# Patient Record
Sex: Female | Born: 1970 | Race: Black or African American | Hispanic: No | Smoking: Former smoker
Health system: Southern US, Community
[De-identification: ages and names within clinical notes are randomized; demographics above are authoritative.]

## PROBLEM LIST (undated history)

## (undated) DIAGNOSIS — R569 Unspecified convulsions: Secondary | ICD-10-CM

## (undated) DIAGNOSIS — I2510 Atherosclerotic heart disease of native coronary artery without angina pectoris: Secondary | ICD-10-CM

## (undated) DIAGNOSIS — K219 Gastro-esophageal reflux disease without esophagitis: Secondary | ICD-10-CM

## (undated) DIAGNOSIS — I219 Acute myocardial infarction, unspecified: Secondary | ICD-10-CM

## (undated) DIAGNOSIS — I10 Essential (primary) hypertension: Secondary | ICD-10-CM

## (undated) DIAGNOSIS — K861 Other chronic pancreatitis: Secondary | ICD-10-CM

## (undated) DIAGNOSIS — E1140 Type 2 diabetes mellitus with diabetic neuropathy, unspecified: Secondary | ICD-10-CM

## (undated) DIAGNOSIS — E1049 Type 1 diabetes mellitus with other diabetic neurological complication: Secondary | ICD-10-CM

## (undated) DIAGNOSIS — K5790 Diverticulosis of intestine, part unspecified, without perforation or abscess without bleeding: Secondary | ICD-10-CM

## (undated) DIAGNOSIS — E1143 Type 2 diabetes mellitus with diabetic autonomic (poly)neuropathy: Secondary | ICD-10-CM

## (undated) DIAGNOSIS — J449 Chronic obstructive pulmonary disease, unspecified: Secondary | ICD-10-CM

## (undated) DIAGNOSIS — E785 Hyperlipidemia, unspecified: Secondary | ICD-10-CM

## (undated) DIAGNOSIS — K3184 Gastroparesis: Secondary | ICD-10-CM

## (undated) HISTORY — PX: BACK SURGERY: SHX140

## (undated) HISTORY — PX: ABDOMINAL HYSTERECTOMY: SHX81

## (undated) HISTORY — PX: OTHER SURGICAL HISTORY: SHX169

## (undated) HISTORY — PX: CHOLECYSTECTOMY: SHX55

## (undated) HISTORY — PX: CORONARY STENT PLACEMENT: SHX1402

---

## 2006-01-18 ENCOUNTER — Encounter: Admission: RE | Admit: 2006-01-18 | Discharge: 2006-01-18 | Payer: Self-pay | Admitting: Neurosurgery

## 2007-02-10 ENCOUNTER — Encounter
Admission: RE | Admit: 2007-02-10 | Discharge: 2007-02-10 | Payer: Self-pay | Admitting: Physical Medicine and Rehabilitation

## 2010-08-07 ENCOUNTER — Emergency Department (HOSPITAL_BASED_OUTPATIENT_CLINIC_OR_DEPARTMENT_OTHER)
Admission: EM | Admit: 2010-08-07 | Discharge: 2010-08-07 | Payer: Self-pay | Source: Home / Self Care | Admitting: Emergency Medicine

## 2010-08-16 LAB — URINALYSIS, ROUTINE W REFLEX MICROSCOPIC
Bilirubin Urine: NEGATIVE
Ketones, ur: 15 mg/dL — AB
Leukocytes, UA: NEGATIVE
Nitrite: NEGATIVE
Protein, ur: >300 mg/dL — AB
Specific Gravity, Urine: 1.021 (ref 1.005–1.030)
Urine Glucose, Fasting: >1000 mg/dL — AB
Urobilinogen, UA: 1 mg/dL (ref 0.0–1.0)
pH: 6.5 (ref 5.0–8.0)

## 2010-08-16 LAB — GLUCOSE, CAPILLARY: Glucose-Capillary: 353 mg/dL — ABNORMAL HIGH (ref 70–99)

## 2010-08-16 LAB — URINE MICROSCOPIC-ADD ON

## 2010-08-22 ENCOUNTER — Encounter: Payer: Self-pay | Admitting: Neurosurgery

## 2011-01-11 ENCOUNTER — Emergency Department (HOSPITAL_COMMUNITY): Payer: PRIVATE HEALTH INSURANCE

## 2011-01-11 ENCOUNTER — Inpatient Hospital Stay (HOSPITAL_COMMUNITY)
Admission: EM | Admit: 2011-01-11 | Discharge: 2011-01-15 | DRG: 074 | Disposition: A | Discharge status: Home / Self Care | Payer: PRIVATE HEALTH INSURANCE | Attending: Internal Medicine | Admitting: Internal Medicine

## 2011-01-11 DIAGNOSIS — I252 Old myocardial infarction: Secondary | ICD-10-CM

## 2011-01-11 DIAGNOSIS — I2510 Atherosclerotic heart disease of native coronary artery without angina pectoris: Secondary | ICD-10-CM | POA: Diagnosis present

## 2011-01-11 DIAGNOSIS — I10 Essential (primary) hypertension: Secondary | ICD-10-CM | POA: Diagnosis present

## 2011-01-11 DIAGNOSIS — R109 Unspecified abdominal pain: Secondary | ICD-10-CM | POA: Diagnosis present

## 2011-01-11 DIAGNOSIS — K5900 Constipation, unspecified: Secondary | ICD-10-CM | POA: Diagnosis present

## 2011-01-11 DIAGNOSIS — E876 Hypokalemia: Secondary | ICD-10-CM | POA: Diagnosis present

## 2011-01-11 DIAGNOSIS — E1169 Type 2 diabetes mellitus with other specified complication: Secondary | ICD-10-CM | POA: Diagnosis present

## 2011-01-11 DIAGNOSIS — K219 Gastro-esophageal reflux disease without esophagitis: Secondary | ICD-10-CM | POA: Diagnosis present

## 2011-01-11 DIAGNOSIS — F411 Generalized anxiety disorder: Secondary | ICD-10-CM | POA: Diagnosis present

## 2011-01-11 DIAGNOSIS — D696 Thrombocytopenia, unspecified: Secondary | ICD-10-CM | POA: Diagnosis present

## 2011-01-11 DIAGNOSIS — E785 Hyperlipidemia, unspecified: Secondary | ICD-10-CM | POA: Diagnosis present

## 2011-01-11 DIAGNOSIS — N390 Urinary tract infection, site not specified: Secondary | ICD-10-CM | POA: Diagnosis present

## 2011-01-11 DIAGNOSIS — I509 Heart failure, unspecified: Secondary | ICD-10-CM | POA: Diagnosis present

## 2011-01-11 DIAGNOSIS — J45909 Unspecified asthma, uncomplicated: Secondary | ICD-10-CM | POA: Diagnosis present

## 2011-01-11 DIAGNOSIS — K3184 Gastroparesis: Secondary | ICD-10-CM | POA: Diagnosis present

## 2011-01-11 DIAGNOSIS — E1149 Type 2 diabetes mellitus with other diabetic neurological complication: Principal | ICD-10-CM | POA: Diagnosis present

## 2011-01-11 DIAGNOSIS — K861 Other chronic pancreatitis: Secondary | ICD-10-CM | POA: Diagnosis present

## 2011-01-11 LAB — URINE MICROSCOPIC-ADD ON

## 2011-01-11 LAB — URINALYSIS, ROUTINE W REFLEX MICROSCOPIC
Bilirubin Urine: NEGATIVE
Glucose, UA: >1000 mg/dL — AB
Ketones, ur: 15 mg/dL — AB
Leukocytes, UA: NEGATIVE
Nitrite: NEGATIVE
Protein, ur: >300 mg/dL — AB
Specific Gravity, Urine: 1.038 — ABNORMAL HIGH (ref 1.005–1.030)
Urobilinogen, UA: 1 mg/dL (ref 0.0–1.0)
pH: 6 (ref 5.0–8.0)

## 2011-01-11 LAB — GLUCOSE, CAPILLARY
Glucose-Capillary: 321 mg/dL — ABNORMAL HIGH (ref 70–99)
Glucose-Capillary: 315 mg/dL — ABNORMAL HIGH (ref 70–99)
Glucose-Capillary: 70 mg/dL (ref 70–99)
Glucose-Capillary: 73 mg/dL (ref 70–99)

## 2011-01-11 LAB — COMPREHENSIVE METABOLIC PANEL
ALT: 11 U/L (ref 0–35)
AST: 15 U/L (ref 0–37)
Albumin: 3.5 g/dL (ref 3.5–5.2)
Alkaline Phosphatase: 143 U/L — ABNORMAL HIGH (ref 39–117)
BUN: 14 mg/dL (ref 6–23)
CO2: 29 mEq/L (ref 19–32)
Calcium: 10.3 mg/dL (ref 8.4–10.5)
Chloride: 90 mEq/L — ABNORMAL LOW (ref 96–112)
Creatinine, Ser: 0.53 mg/dL (ref 0.4–1.2)
GFR calc Af Amer: >60 mL/min (ref >60)
GFR calc non Af Amer: >60 mL/min (ref >60)
Glucose, Bld: 340 mg/dL — ABNORMAL HIGH (ref 70–99)
Potassium: 3.7 mEq/L (ref 3.5–5.1)
Sodium: 134 mEq/L — ABNORMAL LOW (ref 135–145)
Total Bilirubin: 0.4 mg/dL (ref 0.3–1.2)
Total Protein: 8.3 g/dL (ref 6.0–8.3)

## 2011-01-11 LAB — DIFFERENTIAL
Basophils Absolute: 0 10*3/uL (ref 0.0–0.1)
Basophils Relative: 0 % (ref 0–1)
Eosinophils Absolute: 0 10*3/uL (ref 0.0–0.7)
Eosinophils Relative: 0 % (ref 0–5)
Lymphocytes Relative: 39 % (ref 12–46)
Lymphs Abs: 3.9 10*3/uL (ref 0.7–4.0)
Monocytes Absolute: 0.6 10*3/uL (ref 0.1–1.0)
Monocytes Relative: 6 % (ref 3–12)
Neutro Abs: 5.4 10*3/uL (ref 1.7–7.7)
Neutrophils Relative %: 55 % (ref 43–77)

## 2011-01-11 LAB — RAPID URINE DRUG SCREEN, HOSP PERFORMED
Amphetamines: NOT DETECTED
Barbiturates: NOT DETECTED
Benzodiazepines: NOT DETECTED
Cocaine: NOT DETECTED
Opiates: NOT DETECTED
Tetrahydrocannabinol: POSITIVE — AB

## 2011-01-11 LAB — CBC
HCT: 49 % — ABNORMAL HIGH (ref 36.0–46.0)
Hemoglobin: 17 g/dL — ABNORMAL HIGH (ref 12.0–15.0)
MCH: 30.1 pg (ref 26.0–34.0)
MCHC: 34.7 g/dL (ref 30.0–36.0)
MCV: 86.7 fL (ref 78.0–100.0)
Platelets: 190 10*3/uL (ref 150–400)
RBC: 5.65 MIL/uL — ABNORMAL HIGH (ref 3.87–5.11)
RDW: 13 % (ref 11.5–15.5)
WBC: 9.9 10*3/uL (ref 4.0–10.5)

## 2011-01-11 LAB — LIPASE, BLOOD: Lipase: 20 U/L (ref 11–59)

## 2011-01-11 LAB — HEMOGLOBIN A1C
Hgb A1c MFr Bld: 12.3 % — ABNORMAL HIGH (ref <5.7)
Mean Plasma Glucose: 306 mg/dL — ABNORMAL HIGH (ref <117)

## 2011-01-12 ENCOUNTER — Inpatient Hospital Stay (HOSPITAL_COMMUNITY): Payer: PRIVATE HEALTH INSURANCE

## 2011-01-12 LAB — CBC
HCT: 49.7 % — ABNORMAL HIGH (ref 36.0–46.0)
Hemoglobin: 16.9 g/dL — ABNORMAL HIGH (ref 12.0–15.0)
MCH: 30.2 pg (ref 26.0–34.0)
MCHC: 34 g/dL (ref 30.0–36.0)
MCV: 88.9 fL (ref 78.0–100.0)
Platelets: 90 10*3/uL — ABNORMAL LOW (ref 150–400)
RBC: 5.59 MIL/uL — ABNORMAL HIGH (ref 3.87–5.11)
RDW: 13.4 % (ref 11.5–15.5)
WBC: 4.8 10*3/uL (ref 4.0–10.5)

## 2011-01-12 LAB — GLUCOSE, CAPILLARY
Glucose-Capillary: 128 mg/dL — ABNORMAL HIGH (ref 70–99)
Glucose-Capillary: 131 mg/dL — ABNORMAL HIGH (ref 70–99)
Glucose-Capillary: 134 mg/dL — ABNORMAL HIGH (ref 70–99)
Glucose-Capillary: 141 mg/dL — ABNORMAL HIGH (ref 70–99)
Glucose-Capillary: 176 mg/dL — ABNORMAL HIGH (ref 70–99)
Glucose-Capillary: 69 mg/dL — ABNORMAL LOW (ref 70–99)

## 2011-01-12 LAB — BASIC METABOLIC PANEL
BUN: 15 mg/dL (ref 6–23)
CO2: 25 mEq/L (ref 19–32)
Calcium: 8.2 mg/dL — ABNORMAL LOW (ref 8.4–10.5)
Chloride: 105 mEq/L (ref 96–112)
Creatinine, Ser: 0.79 mg/dL (ref 0.4–1.2)
GFR calc Af Amer: >60 mL/min (ref >60)
GFR calc non Af Amer: >60 mL/min (ref >60)
Glucose, Bld: 123 mg/dL — ABNORMAL HIGH (ref 70–99)
Potassium: 3.4 mEq/L — ABNORMAL LOW (ref 3.5–5.1)
Sodium: 139 mEq/L (ref 135–145)

## 2011-01-12 LAB — TECHNOLOGIST SMEAR REVIEW

## 2011-01-12 NOTE — H&P (Signed)
NAMERUSTIE, GILL NO.:  [id number]  MEDICAL RECORD NO.:  [id number]  LOCATION:  WLED                         FACILITY:  [facility]  PHYSICIAN:  Jacquelynn Cree, M.D.   DATE OF BIRTH:  1971/06/16  DATE OF ADMISSION:  01/11/2011 DATE OF DISCHARGE:                             HISTORY & PHYSICAL   PRIMARY CARE PHYSICIAN:  Dr. Tempie Hoist at [facility].  CHIEF COMPLAINT:  Abdominal pain, nausea, vomiting.  HISTORY OF PRESENT ILLNESS:  The patient is a 40 year old female with a past medical history of chronic pancreatitis and diabetic gastroparesis with multiple hospital admissions at other facilities for intractable nausea and vomiting.  The patient states that she has had to have a percutaneous enteral gastrostomy tube placed on four separate occasions for treatment and tube feedings related to her gastroparesis.  She reports that she was in her usual state of health up until 2 days ago when she experienced a gradual onset of abdominal pain accompanied by nausea and vomiting.  She states she has vomited 10 to 15 times over the past 24 hours and has had some mild hematemesis.  Her last bowel movement was approximately 2 days ago and denies any melena or hematochezia.  She denies any fever but reports chills after vomiting episodes and that she has been unable to keep anything down including her medications for the past 2 days.  Nothing has made her symptoms better and attempting to eat makes her pain worse.  Upon initial evaluation in the emergency department, the patient was found to have accelerated hypertension and evidence of dehydration and therefore has been referred to the hospitalist service for further evaluation and treatment.  PAST MEDICAL HISTORY: 1. Chronic pancreatitis. 2. Type 2 diabetes. 3. Diabetic gastroparesis. 4. Hypertension. 5. Dyslipidemia. 6. Peripheral neuropathy. 7. Asthma. 8. Gastroesophageal reflux  disease. 9. Remote history of seizures (not on medicines, none x3 years) 10.Status post myocardial infarction. 11.Congestive heart failure, ejection fraction unknown.  PAST SURGICAL HISTORY: 1. Hysterectomy. 2. Back surgery. 3. Cholecystectomy. 4. PEG tube placement x4.  FAMILY HISTORY:  The patient's mother died at 28 from an unspecified cancer, possibly lung.  She also had COPD and endometriosis.  The patient's father is alive in his 40s.  He has hypertension and hiatal hernia.  The patient has 3 sisters who suffer with hypertension and diabetes.  SOCIAL HISTORY:  The patient is divorced and currently living with her sister and her 5 children.  She smokes 2 black and mild per day and denies alcohol as well as drug abuse.  She is currently disabled.  ALLERGIES:  COMPAZINE, DARVOCET, NSAIDs, PENICILLIN, ZOFRAN, GENERIC PHENERGAN.  CURRENT MEDICATIONS: 1. Advair 250/50 one inhalation b.i.d. 2. Aspirin 81 mg daily. 3. Gabapentin 600 mg q.i.d. 4. Humalog 10 units q.a.c. 5. Insulin sensitive sliding scale q.a.c. 6. Lipitor 80 mg p.o. daily. 7. Lisinopril 20 mg p.o. daily. 8. Metoprolol 25 mg p.o. b.i.d. 9. Nexium 40 mg p.o. b.i.d. 10.Nitroglycerin 0.4 mg sublingually p.r.n. chest pain x3. 11.Plavix 75 mg p.o. daily. 12.Phenergan 25 mg p.o. q.6 h p.r.n. nausea, vomiting. 13.Reglan 10 mg p.o. q.a.c. and h.s.  REVIEW OF SYSTEMS:  CONSTITUTIONAL:  Diminished appetite.  No weight loss.  No fever.  Positive chills.  HEENT:  The patient wears contacts and is edentulous.  CARDIOVASCULAR:  No current chest pain or dysrhythmia.  RESPIRATORY:  No shortness of breath or cough.  GI:  As per the illness of the HPI above.  GU:  No dysuria or hematuria. MUSCULOSKELETAL:  Occasional back pain.  A comprehensive 14-point review of systems is otherwise unremarkable.  PHYSICAL EXAMINATION:  VITAL SIGNS:  Temperature 98.8, pulse 115, respirations 22, blood pressure 195/121. GENERAL:  Well-developed, well-nourished African-American female who is writhing about on the bed in an exaggerated fashion. HEENT:  Normocephalic, atraumatic.  PERRL.  EOMI.  The patient is edentulous.  There is white coating to the tongue.  Mucous membranes are moist. NECK:  Supple, no thyromegaly, no lymphadenopathy, no jugular venous distention. CHEST:  Lungs clear to auscultation bilaterally with good air movement. HEART:  Tachycardic rate, regular rhythm.  No murmurs, rubs or gallops. ABDOMEN:  Soft.  Tender in an exaggerated fashion.  When the bed is jarred, the patient does not guard but has an exaggerated response to even mild touch to the abdomen.  There are positive bowel sounds. EXTREMITIES:  No clubbing, edema, or cyanosis. SKIN:  Warm, dry.  No rashes. NEUROLOGIC:  The patient is alert and oriented x3.  Cranial nerves II through XII grossly intact.  Nonfocal.  DATA REVIEW:  Acute abdominal series shows no active lung disease. Moderate amount of feces throughout the colon.  No obstruction or free air.  LABORATORY VALUES:  Lipase is 20.  Sodium is 134, potassium 3.7, chloride 90, bicarb 29, BUN 14, creatinine 0.53, glucose 340, calcium 10.3, total bilirubin 0.4, alkaline phosphatase 143, AST 15, ALT 11, total protein 8.3, albumin 3.5.  White blood cell count is 9.9, hemoglobin 17, hematocrit 49.0, platelets 190.  ASSESSMENT AND PLAN: 1. Intractable nausea, vomiting, abdominal pain in a patient with     known chronic pancreatitis and diabetic gastroparesis:  We will     admit the patient for conservative therapy including IV fluid     hydration, antiemetics, and judicious use of pain medications.     Given that she is currently constipated, we will also give her a     Dulcolax suppository and if she does not move her bowels, follow     this with a Fleet's enema.  Given that her exam is fairly     exaggerated and out of proportion to objective data, we will     attempt to contact  her primary care physician to extract further     historical data and information regarding her prior hospital stays.     We will urine drug screen her as well. 2. Diabetes:  We will check a hemoglobin A1c and place her on sliding     scale insulin q.4 h while n.p.o.. 3. Gastroparesis:  We will initiate IV Reglan q.6 h while n.p.o. and     change this to q.a.c. and h.s. when she is able to eat. 4. Hypertension:  Resume the patient's antihypertensives and use     hydralazine p.r.n. if she is unable to keep medications down. 5. Dyslipidemia:  We will continue the patient's statin therapy as     tolerated. 6. Neuropathy:  Continue the patient's Neurontin as tolerated. 7. Asthma:  Continue the patient's Advair.  She is not evidencing any     bronchospasm at the present time.  We will order albuterol on an as-  needed basis. 8. Gastroesophageal reflux disease:  Continue the patient's proton     pump inhibitor therapy. 9. Prophylaxis:  We will initiate Lovenox for DVT prophylaxis.  Time spent on admission including face-to-face time equals approximately 1 hour.     Jacquelynn Cree, M.D.     CR/MEDQ  D:  01/11/2011  T:  01/11/2011  Job:  [id number]  cc:   Dr. Tempie Hoist at [facility]  Electronically Signed by Jacquelynn Cree M.D. on 01/12/2011 12:44:37 PM

## 2011-01-13 ENCOUNTER — Inpatient Hospital Stay (HOSPITAL_COMMUNITY): Payer: PRIVATE HEALTH INSURANCE

## 2011-01-13 LAB — URINE MICROSCOPIC-ADD ON

## 2011-01-13 LAB — BASIC METABOLIC PANEL
BUN: 8 mg/dL (ref 6–23)
Calcium: 8.4 mg/dL (ref 8.4–10.5)
Creatinine, Ser: 0.48 mg/dL (ref 0.4–1.2)
GFR calc Af Amer: >60 mL/min (ref >60)
GFR calc non Af Amer: >60 mL/min (ref >60)

## 2011-01-13 LAB — URINALYSIS, ROUTINE W REFLEX MICROSCOPIC
Bilirubin Urine: NEGATIVE
Glucose, UA: NEGATIVE mg/dL
Ketones, ur: NEGATIVE mg/dL
Specific Gravity, Urine: 1.013 (ref 1.005–1.030)
pH: 5 (ref 5.0–8.0)

## 2011-01-13 LAB — GLUCOSE, CAPILLARY: Glucose-Capillary: 120 mg/dL — ABNORMAL HIGH (ref 70–99)

## 2011-01-13 LAB — CBC
HCT: 39.2 % (ref 36.0–46.0)
MCH: 29.3 pg (ref 26.0–34.0)
MCHC: 32.4 g/dL (ref 30.0–36.0)
MCV: 90.3 fL (ref 78.0–100.0)
RDW: 13.3 % (ref 11.5–15.5)

## 2011-01-13 MED ORDER — IOHEXOL 300 MG/ML  SOLN
100.0000 mL | Freq: Once | INTRAMUSCULAR | Status: AC | PRN
  Administered 2011-01-13: 100 mL via INTRAVENOUS

## 2011-01-14 LAB — CARDIAC PANEL(CRET KIN+CKTOT+MB+TROPI)
CK, MB: 1.8 ng/mL (ref 0.3–4.0)
CK, MB: 1.6 ng/mL (ref 0.3–4.0)
Relative Index: INVALID (ref 0.0–2.5)
Total CK: 94 U/L (ref 7–177)
Total CK: 92 U/L (ref 7–177)
Troponin I: <0.3 ng/mL (ref <0.30)

## 2011-01-14 LAB — COMPREHENSIVE METABOLIC PANEL
ALT: 14 U/L (ref 0–35)
AST: 21 U/L (ref 0–37)
Alkaline Phosphatase: 101 U/L (ref 39–117)
CO2: 25 mEq/L (ref 19–32)
Calcium: 8.9 mg/dL (ref 8.4–10.5)
Chloride: 104 mEq/L (ref 96–112)
Glucose, Bld: 63 mg/dL — ABNORMAL LOW (ref 70–99)
Sodium: 138 mEq/L (ref 135–145)
Total Bilirubin: 0.2 mg/dL — ABNORMAL LOW (ref 0.3–1.2)

## 2011-01-14 LAB — DIFFERENTIAL
Basophils Relative: 0 % (ref 0–1)
Eosinophils Absolute: 0.1 10*3/uL (ref 0.0–0.7)
Lymphs Abs: 3.2 10*3/uL (ref 0.7–4.0)
Monocytes Relative: 9 % (ref 3–12)
Neutro Abs: 3.1 10*3/uL (ref 1.7–7.7)
Neutrophils Relative %: 44 % (ref 43–77)

## 2011-01-14 LAB — GLUCOSE, CAPILLARY
Glucose-Capillary: 57 mg/dL — ABNORMAL LOW (ref 70–99)
Glucose-Capillary: 190 mg/dL — ABNORMAL HIGH (ref 70–99)
Glucose-Capillary: 182 mg/dL — ABNORMAL HIGH (ref 70–99)

## 2011-01-14 LAB — CBC
Hemoglobin: 13.7 g/dL (ref 12.0–15.0)
MCH: 29.8 pg (ref 26.0–34.0)
Platelets: 139 10*3/uL — ABNORMAL LOW (ref 150–400)
RBC: 4.59 MIL/uL (ref 3.87–5.11)
WBC: 7 10*3/uL (ref 4.0–10.5)

## 2011-01-15 LAB — URINE CULTURE
Colony Count: >=100000
Special Requests: NEGATIVE

## 2011-01-15 LAB — GLUCOSE, CAPILLARY: Glucose-Capillary: 238 mg/dL — ABNORMAL HIGH (ref 70–99)

## 2011-01-16 ENCOUNTER — Emergency Department (HOSPITAL_COMMUNITY): Payer: PRIVATE HEALTH INSURANCE

## 2011-01-16 ENCOUNTER — Emergency Department (HOSPITAL_COMMUNITY)
Admission: EM | Admit: 2011-01-16 | Discharge: 2011-01-16 | Disposition: A | Discharge status: Home / Self Care | Payer: PRIVATE HEALTH INSURANCE | Attending: Emergency Medicine | Admitting: Emergency Medicine

## 2011-01-16 DIAGNOSIS — M25579 Pain in unspecified ankle and joints of unspecified foot: Secondary | ICD-10-CM | POA: Insufficient documentation

## 2011-01-16 DIAGNOSIS — I10 Essential (primary) hypertension: Secondary | ICD-10-CM | POA: Insufficient documentation

## 2011-01-16 DIAGNOSIS — S93409A Sprain of unspecified ligament of unspecified ankle, initial encounter: Secondary | ICD-10-CM | POA: Insufficient documentation

## 2011-01-16 DIAGNOSIS — E119 Type 2 diabetes mellitus without complications: Secondary | ICD-10-CM | POA: Insufficient documentation

## 2011-01-16 DIAGNOSIS — Z794 Long term (current) use of insulin: Secondary | ICD-10-CM | POA: Insufficient documentation

## 2011-01-16 DIAGNOSIS — X500XXA Overexertion from strenuous movement or load, initial encounter: Secondary | ICD-10-CM | POA: Insufficient documentation

## 2011-01-19 ENCOUNTER — Emergency Department (HOSPITAL_COMMUNITY)
Admission: EM | Admit: 2011-01-19 | Discharge: 2011-01-19 | Disposition: A | Discharge status: Home / Self Care | Payer: PRIVATE HEALTH INSURANCE | Attending: Emergency Medicine | Admitting: Emergency Medicine

## 2011-01-19 DIAGNOSIS — Z0389 Encounter for observation for other suspected diseases and conditions ruled out: Secondary | ICD-10-CM | POA: Insufficient documentation

## 2011-02-08 NOTE — Discharge Summary (Signed)
[name], TREGLIA NO.:  [id number]  MEDICAL RECORD NO.:  [id number]  LOCATION:  [id number]                         FACILITY:  [facility]  PHYSICIAN:  Hosie Poisson, MD       DATE OF BIRTH:  [birth year]  DATE OF ADMISSION:  01/11/2011 DATE OF DISCHARGE:  01/15/2011                              DISCHARGE SUMMARY   PRIMARY CARE PHYSICIAN:  Tempie Hoist, MD, at [facility].  DISCHARGE DIAGNOSES: 1. Urinary tract infection. 2. Chronic pancreatitis. 3. Type 2 diabetes. 4. Diabetic gastroparesis. 5. Hypokalemia. 6. Hyperglycemia. 7. Hypertension. 8. Dyslipidemia. 9. Peripheral neuropathy. 10.Asthma. 11.Constipation. 12.Gastroesophageal reflux disease. 13.Remote history of seizures, not on any medications. 14.Status post myocardial infarction. 15.History of congestive heart failure.  DISCHARGE MEDICATIONS: 1. Acetaminophen 650 mg q.6 h. as needed. 2. Dulcolax 10 mg daily as needed. 3. Ciprofloxacin 500 mg twice daily for 7 days. 4. Advair 1 puff twice daily. 5. Gabapentin 600 mg 4 times a day. 6. Lisinopril 20 mg daily. 7. Reglan 10 mg every 6 hours. 8. Metoprolol 25 mg twice daily. 9. Zofran 4 mg every 6 hours. 10.Protonix 40 mg twice daily. 11.Crestor 40 mg daily. 12.Hydrocodone/APAP 5/325 every 8 hours as needed. 13.Lorazepam 0.5 mg daily at bedtime as needed.  PERTINENT LABORATORIES ON ADMISSION:  The patient had CBC significant for a hemoglobin of 17 and hematocrit of 49.  Comprehensive metabolic panel significant for sodium of 134, glucose of 340, chloride of 90, and lipase was 20.  Urinalysis was negative for nitrites and leukocytes. Urine drug screen was positive for tetrahydrocannabinol.  Hemoglobin A1c was 12.3.  Smear shows atypical lymphocytes, RBC morphology unremarkable.  Urinalysis on June 14th shows large leukocytes. Erythropoietin level was 6.5.  Cardiac enzymes were negative.  Urine culture showed E coli  pansensitive.  RADIOLOGY:  The patient had an acute abdominal series, which showed no lung disease, moderate amount of feces throughout the colon.  The patient had an ultrasound of the abdomen, showed dilatation of the intra and extrahepatic biliary tree, question of biliary obstruction is raised since the pancreatic duct is also dilated.  A CT of abdomen and pelvis was done, which showed distended and very thick-walled urinary bladder favoring cystitis, neurogenic bladder is differential diagnosis.  The amount of the biliary dilatation noted could be physiological based on prior cholecystectomy, nonspecific mild periportal edema.  BRIEF HOSPITAL COURSE:  This is a 40 year old lady with past medical history of chronic pancreatitis, diabetic gastroparesis, multiple hospital admissions at other facilities for intractable nausea and vomiting.  The patient was admitted for symptomatic control for nausea, vomiting, and abdominal pain with a known history of chronic pancreatitis.  She was put on conservative therapy including hydration, antiemetics, and pain medications.  Over the course of her hospitalization, her symptoms gradually improved.  Constipation:  She was given Dulcolax suppository and a Fleet enema and constipation resolved.  Hypertension:  Uncontrolled.  Type 2 diabetes:  The patient had a hemoglobin A1c done, which was 12. She was started on insulin and the patient became hypoglycemic and she was put on NovoLog sliding scale q.4 h.  Dyslipidemia:  Continue the patient's Crestor as needed.  Asthma:  Controlled.  Continue with Advair and albuterol as needed.  Urinary tract infection with Escherichia coli:  The patient was started on ciprofloxacin.  She was given a prescription for Cipro to complete the course of 7 days.  PHYSICAL EXAMINATION:  VITAL SIGNS:  On the day of discharge, the patient's vitals included temperature of 98, pulse of 87, respirations 18, blood  pressure 129/83, and saturating 97% on room air. GENERAL:  She is alert, afebrile, oriented x3, comfortable in no acute distress. CARDIOVASCULAR:  S1 and S2 heard. RESPIRATORY:  Clear to auscultation bilaterally. ABDOMEN:  Soft, nontender, and nondistended. EXTREMITIES:  No pedal edema.  On the day of discharge, the patient's symptoms of nausea, vomiting, and abdominal pain have improved.  She was given a prescription for antibiotics for her urinary tract infection.  She was recommended to follow with her PCP in about 1 week and also encouraged and counseling given regarding substance abuse.  The patient is hemodynamically stable for discharge.          ______________________________ Hosie Poisson, MD     VA/MEDQ  D:  02/08/2011  T:  02/08/2011  Job:  [id number]  Electronically Signed by Hosie Poisson MD on 02/08/2011 04:19:40 AM

## 2011-02-15 ENCOUNTER — Emergency Department (HOSPITAL_COMMUNITY): Payer: PRIVATE HEALTH INSURANCE

## 2011-02-15 ENCOUNTER — Emergency Department (HOSPITAL_COMMUNITY)
Admission: EM | Admit: 2011-02-15 | Discharge: 2011-02-16 | Disposition: A | Discharge status: Home / Self Care | Payer: PRIVATE HEALTH INSURANCE | Attending: Emergency Medicine | Admitting: Emergency Medicine

## 2011-02-15 DIAGNOSIS — E119 Type 2 diabetes mellitus without complications: Secondary | ICD-10-CM | POA: Insufficient documentation

## 2011-02-15 DIAGNOSIS — R112 Nausea with vomiting, unspecified: Secondary | ICD-10-CM | POA: Insufficient documentation

## 2011-02-15 DIAGNOSIS — G8929 Other chronic pain: Secondary | ICD-10-CM | POA: Insufficient documentation

## 2011-02-15 DIAGNOSIS — R109 Unspecified abdominal pain: Secondary | ICD-10-CM | POA: Insufficient documentation

## 2011-02-15 DIAGNOSIS — Z794 Long term (current) use of insulin: Secondary | ICD-10-CM | POA: Insufficient documentation

## 2011-02-16 LAB — COMPREHENSIVE METABOLIC PANEL
Albumin: 2.8 g/dL — ABNORMAL LOW (ref 3.5–5.2)
Alkaline Phosphatase: 123 U/L — ABNORMAL HIGH (ref 39–117)
BUN: 25 mg/dL — ABNORMAL HIGH (ref 6–23)
Calcium: 9 mg/dL (ref 8.4–10.5)
Creatinine, Ser: 0.73 mg/dL (ref 0.50–1.10)
GFR calc Af Amer: >60 mL/min (ref >60)
Glucose, Bld: 411 mg/dL — ABNORMAL HIGH (ref 70–99)
Total Protein: 7.6 g/dL (ref 6.0–8.3)

## 2011-02-16 LAB — URINALYSIS, ROUTINE W REFLEX MICROSCOPIC
Glucose, UA: >1000 mg/dL — AB
Leukocytes, UA: NEGATIVE
Protein, ur: >300 mg/dL — AB
Urobilinogen, UA: 0.2 mg/dL (ref 0.0–1.0)

## 2011-02-16 LAB — DIFFERENTIAL
Basophils Absolute: 0 10*3/uL (ref 0.0–0.1)
Eosinophils Relative: 0 % (ref 0–5)
Lymphocytes Relative: 32 % (ref 12–46)
Monocytes Absolute: 0.5 10*3/uL (ref 0.1–1.0)
Monocytes Relative: 6 % (ref 3–12)
Neutro Abs: 4.6 10*3/uL (ref 1.7–7.7)

## 2011-02-16 LAB — PREGNANCY, URINE: Preg Test, Ur: NEGATIVE

## 2011-02-16 LAB — LIPASE, BLOOD: Lipase: 27 U/L (ref 11–59)

## 2011-02-16 LAB — CBC
HCT: 42.1 % (ref 36.0–46.0)
Hemoglobin: 14.6 g/dL (ref 12.0–15.0)
MCH: 29.6 pg (ref 26.0–34.0)
MCHC: 34.7 g/dL (ref 30.0–36.0)
MCV: 85.4 fL (ref 78.0–100.0)
RDW: 12.7 % (ref 11.5–15.5)

## 2011-02-16 LAB — URINE MICROSCOPIC-ADD ON

## 2011-02-16 LAB — AMYLASE: Amylase: 67 U/L (ref 0–105)

## 2011-03-19 ENCOUNTER — Emergency Department (HOSPITAL_COMMUNITY): Payer: PRIVATE HEALTH INSURANCE

## 2011-03-19 ENCOUNTER — Emergency Department (HOSPITAL_COMMUNITY)
Admission: EM | Admit: 2011-03-19 | Discharge: 2011-03-19 | Disposition: A | Discharge status: Home / Self Care | Payer: PRIVATE HEALTH INSURANCE | Attending: Emergency Medicine | Admitting: Emergency Medicine

## 2011-03-19 DIAGNOSIS — S335XXA Sprain of ligaments of lumbar spine, initial encounter: Secondary | ICD-10-CM | POA: Insufficient documentation

## 2011-03-19 DIAGNOSIS — I10 Essential (primary) hypertension: Secondary | ICD-10-CM | POA: Insufficient documentation

## 2011-03-19 DIAGNOSIS — Z794 Long term (current) use of insulin: Secondary | ICD-10-CM | POA: Insufficient documentation

## 2011-03-19 DIAGNOSIS — E119 Type 2 diabetes mellitus without complications: Secondary | ICD-10-CM | POA: Insufficient documentation

## 2011-03-19 DIAGNOSIS — M25559 Pain in unspecified hip: Secondary | ICD-10-CM | POA: Insufficient documentation

## 2011-03-19 DIAGNOSIS — Y92009 Unspecified place in unspecified non-institutional (private) residence as the place of occurrence of the external cause: Secondary | ICD-10-CM | POA: Insufficient documentation

## 2011-03-19 DIAGNOSIS — S0990XA Unspecified injury of head, initial encounter: Secondary | ICD-10-CM | POA: Insufficient documentation

## 2011-03-19 DIAGNOSIS — W108XXA Fall (on) (from) other stairs and steps, initial encounter: Secondary | ICD-10-CM | POA: Insufficient documentation

## 2011-03-19 LAB — RAPID URINE DRUG SCREEN, HOSP PERFORMED
Amphetamines: NOT DETECTED
Tetrahydrocannabinol: POSITIVE — AB

## 2011-03-19 LAB — URINE MICROSCOPIC-ADD ON

## 2011-03-19 LAB — URINALYSIS, ROUTINE W REFLEX MICROSCOPIC
Glucose, UA: 250 mg/dL — AB
Protein, ur: >300 mg/dL — AB
Specific Gravity, Urine: 1.013 (ref 1.005–1.030)
Urobilinogen, UA: 0.2 mg/dL (ref 0.0–1.0)

## 2011-03-19 LAB — ETHANOL: Alcohol, Ethyl (B): <11 mg/dL (ref 0–11)

## 2011-03-28 ENCOUNTER — Other Ambulatory Visit: Payer: Self-pay | Admitting: Neurosurgery

## 2011-03-28 DIAGNOSIS — S0990XA Unspecified injury of head, initial encounter: Secondary | ICD-10-CM

## 2011-04-06 ENCOUNTER — Ambulatory Visit
Admission: RE | Admit: 2011-04-06 | Discharge: 2011-04-06 | Disposition: A | Discharge status: Home / Self Care | Payer: PRIVATE HEALTH INSURANCE | Source: Ambulatory Visit | Attending: Neurosurgery | Admitting: Neurosurgery

## 2011-04-06 DIAGNOSIS — S0990XA Unspecified injury of head, initial encounter: Secondary | ICD-10-CM

## 2011-06-10 ENCOUNTER — Inpatient Hospital Stay (HOSPITAL_COMMUNITY)
Admission: EM | Admit: 2011-06-10 | Discharge: 2011-06-12 | DRG: 074 | Disposition: A | Discharge status: Home / Self Care | Payer: PRIVATE HEALTH INSURANCE | Attending: Internal Medicine | Admitting: Internal Medicine

## 2011-06-10 ENCOUNTER — Emergency Department (HOSPITAL_COMMUNITY): Payer: PRIVATE HEALTH INSURANCE

## 2011-06-10 ENCOUNTER — Other Ambulatory Visit: Payer: Self-pay

## 2011-06-10 DIAGNOSIS — E785 Hyperlipidemia, unspecified: Secondary | ICD-10-CM | POA: Diagnosis present

## 2011-06-10 DIAGNOSIS — K219 Gastro-esophageal reflux disease without esophagitis: Secondary | ICD-10-CM | POA: Diagnosis present

## 2011-06-10 DIAGNOSIS — I2510 Atherosclerotic heart disease of native coronary artery without angina pectoris: Secondary | ICD-10-CM | POA: Diagnosis present

## 2011-06-10 DIAGNOSIS — K3184 Gastroparesis: Secondary | ICD-10-CM | POA: Diagnosis present

## 2011-06-10 DIAGNOSIS — E1149 Type 2 diabetes mellitus with other diabetic neurological complication: Principal | ICD-10-CM | POA: Diagnosis present

## 2011-06-10 DIAGNOSIS — E0865 Diabetes mellitus due to underlying condition with hyperglycemia: Secondary | ICD-10-CM

## 2011-06-10 DIAGNOSIS — R112 Nausea with vomiting, unspecified: Secondary | ICD-10-CM | POA: Diagnosis present

## 2011-06-10 DIAGNOSIS — K861 Other chronic pancreatitis: Secondary | ICD-10-CM | POA: Diagnosis present

## 2011-06-10 DIAGNOSIS — J45909 Unspecified asthma, uncomplicated: Secondary | ICD-10-CM | POA: Diagnosis present

## 2011-06-10 DIAGNOSIS — R748 Abnormal levels of other serum enzymes: Secondary | ICD-10-CM

## 2011-06-10 DIAGNOSIS — F17200 Nicotine dependence, unspecified, uncomplicated: Secondary | ICD-10-CM | POA: Diagnosis present

## 2011-06-10 DIAGNOSIS — Z794 Long term (current) use of insulin: Secondary | ICD-10-CM

## 2011-06-10 DIAGNOSIS — I10 Essential (primary) hypertension: Secondary | ICD-10-CM | POA: Diagnosis present

## 2011-06-10 DIAGNOSIS — E876 Hypokalemia: Secondary | ICD-10-CM | POA: Diagnosis present

## 2011-06-10 HISTORY — DX: Other chronic pancreatitis: K86.1

## 2011-06-10 HISTORY — DX: Atherosclerotic heart disease of native coronary artery without angina pectoris: I25.10

## 2011-06-10 HISTORY — DX: Hyperlipidemia, unspecified: E78.5

## 2011-06-10 HISTORY — DX: Essential (primary) hypertension: I10

## 2011-06-10 HISTORY — DX: Gastro-esophageal reflux disease without esophagitis: K21.9

## 2011-06-10 LAB — POCT PREGNANCY, URINE: Preg Test, Ur: NEGATIVE

## 2011-06-10 LAB — CREATININE, SERUM
Creatinine, Ser: 0.49 mg/dL — ABNORMAL LOW (ref 0.50–1.10)
GFR calc non Af Amer: >90 mL/min (ref >90)

## 2011-06-10 LAB — CBC
HCT: 40.3 % (ref 36.0–46.0)
Hemoglobin: 15.8 g/dL — ABNORMAL HIGH (ref 12.0–15.0)
Hemoglobin: 14 g/dL (ref 12.0–15.0)
MCH: 30.6 pg (ref 26.0–34.0)
MCH: 30.5 pg (ref 26.0–34.0)
MCV: 87.2 fL (ref 78.0–100.0)
RBC: 5.17 MIL/uL — ABNORMAL HIGH (ref 3.87–5.11)
RBC: 4.59 MIL/uL (ref 3.87–5.11)

## 2011-06-10 LAB — DIFFERENTIAL
Basophils Relative: 0 % (ref 0–1)
Eosinophils Relative: 0 % (ref 0–5)
Lymphs Abs: 2.9 10*3/uL (ref 0.7–4.0)
Monocytes Absolute: 0.5 10*3/uL (ref 0.1–1.0)

## 2011-06-10 LAB — URINALYSIS, ROUTINE W REFLEX MICROSCOPIC
Glucose, UA: >1000 mg/dL — AB
Ketones, ur: 15 mg/dL — AB
Protein, ur: >300 mg/dL — AB

## 2011-06-10 LAB — COMPREHENSIVE METABOLIC PANEL
BUN: 13 mg/dL (ref 6–23)
CO2: 23 mEq/L (ref 19–32)
Calcium: 10.1 mg/dL (ref 8.4–10.5)
Creatinine, Ser: 0.42 mg/dL — ABNORMAL LOW (ref 0.50–1.10)
GFR calc Af Amer: >90 mL/min (ref >90)
GFR calc non Af Amer: >90 mL/min (ref >90)
Glucose, Bld: 337 mg/dL — ABNORMAL HIGH (ref 70–99)

## 2011-06-10 LAB — CARDIAC PANEL(CRET KIN+CKTOT+MB+TROPI)
CK, MB: 1.9 ng/mL (ref 0.3–4.0)
Total CK: 137 U/L (ref 7–177)
Total CK: 411 U/L — ABNORMAL HIGH (ref 7–177)
Troponin I: <0.3 ng/mL (ref <0.30)

## 2011-06-10 LAB — RAPID URINE DRUG SCREEN, HOSP PERFORMED
Opiates: NOT DETECTED
Tetrahydrocannabinol: POSITIVE — AB

## 2011-06-10 LAB — URINE MICROSCOPIC-ADD ON

## 2011-06-10 LAB — ETHANOL: Alcohol, Ethyl (B): <11 mg/dL (ref 0–11)

## 2011-06-10 MED ORDER — ASPIRIN EC 81 MG PO TBEC
81.0000 mg | DELAYED_RELEASE_TABLET | Freq: Every day | ORAL | Status: DC
  Administered 2011-06-10 – 2011-06-12 (×3): 81 mg via ORAL
  Filled 2011-06-10 (×4): qty 1

## 2011-06-10 MED ORDER — METOCLOPRAMIDE HCL 5 MG/ML IJ SOLN
10.0000 mg | Freq: Once | INTRAMUSCULAR | Status: AC
  Administered 2011-06-10: 10 mg via INTRAMUSCULAR
  Filled 2011-06-10: qty 2

## 2011-06-10 MED ORDER — INSULIN REGULAR HUMAN 100 UNIT/ML IJ SOLN: 8.0000 [IU] | Freq: Once | INTRAMUSCULAR | Status: DC

## 2011-06-10 MED ORDER — CLOPIDOGREL BISULFATE 75 MG PO TABS
75.0000 mg | ORAL_TABLET | Freq: Every day | ORAL | Status: DC
  Administered 2011-06-11 – 2011-06-12 (×2): 75 mg via ORAL
  Filled 2011-06-10 (×3): qty 1

## 2011-06-10 MED ORDER — HYDRALAZINE HCL 20 MG/ML IJ SOLN
10.0000 mg | INTRAMUSCULAR | Status: DC | PRN
  Filled 2011-06-10: qty 0.5

## 2011-06-10 MED ORDER — METOCLOPRAMIDE HCL 5 MG/ML IJ SOLN
10.0000 mg | Freq: Four times a day (QID) | INTRAMUSCULAR | Status: DC
  Administered 2011-06-11 – 2011-06-12 (×7): 10 mg via INTRAVENOUS
  Filled 2011-06-10 (×15): qty 2

## 2011-06-10 MED ORDER — HYDROMORPHONE HCL PF 1 MG/ML IJ SOLN
2.0000 mg | Freq: Once | INTRAMUSCULAR | Status: AC
  Administered 2011-06-10: 2 mg via INTRAVENOUS
  Filled 2011-06-10: qty 1

## 2011-06-10 MED ORDER — NEOMYCIN-POLYMYXIN-HC 3.5-10000-1 OT SOLN
3.0000 [drp] | Freq: Four times a day (QID) | OTIC | Status: DC
  Administered 2011-06-11: 3 [drp] via OTIC
  Filled 2011-06-10: qty 10

## 2011-06-10 MED ORDER — INSULIN ASPART 100 UNIT/ML ~~LOC~~ SOLN
0.0000 [IU] | SUBCUTANEOUS | Status: DC
  Administered 2011-06-10: 17:00:00 via SUBCUTANEOUS
  Administered 2011-06-11 – 2011-06-12 (×4): 2 [IU] via SUBCUTANEOUS
  Filled 2011-06-10: qty 3

## 2011-06-10 MED ORDER — PANTOPRAZOLE SODIUM 40 MG IV SOLR
40.0000 mg | Freq: Two times a day (BID) | INTRAVENOUS | Status: DC
  Administered 2011-06-10 – 2011-06-12 (×4): 40 mg via INTRAVENOUS
  Filled 2011-06-10 (×7): qty 40

## 2011-06-10 MED ORDER — INSULIN ASPART 100 UNIT/ML ~~LOC~~ SOLN
8.0000 [IU] | SUBCUTANEOUS | Status: AC
  Administered 2011-06-10: 8 [IU] via SUBCUTANEOUS

## 2011-06-10 MED ORDER — FLUTICASONE-SALMETEROL 250-50 MCG/DOSE IN AEPB
1.0000 | INHALATION_SPRAY | Freq: Two times a day (BID) | RESPIRATORY_TRACT | Status: DC
  Administered 2011-06-10 – 2011-06-12 (×4): 1 via RESPIRATORY_TRACT
  Filled 2011-06-10: qty 14

## 2011-06-10 MED ORDER — PROMETHAZINE HCL 25 MG PO TABS: 12.5000 mg | ORAL_TABLET | Freq: Four times a day (QID) | ORAL | Status: DC | PRN

## 2011-06-10 MED ORDER — PROMETHAZINE HCL 25 MG/ML IJ SOLN
12.5000 mg | Freq: Four times a day (QID) | INTRAMUSCULAR | Status: DC | PRN
  Administered 2011-06-10 – 2011-06-12 (×5): 12.5 mg via INTRAVENOUS
  Filled 2011-06-10 (×4): qty 1

## 2011-06-10 MED ORDER — ENOXAPARIN SODIUM 40 MG/0.4ML ~~LOC~~ SOLN
40.0000 mg | SUBCUTANEOUS | Status: DC
  Administered 2011-06-10 – 2011-06-11 (×2): 40 mg via SUBCUTANEOUS
  Filled 2011-06-10 (×4): qty 0.4

## 2011-06-10 MED ORDER — HYDROMORPHONE HCL PF 1 MG/ML IJ SOLN
1.0000 mg | INTRAMUSCULAR | Status: DC | PRN
  Administered 2011-06-10: 0.5 mg via INTRAMUSCULAR
  Administered 2011-06-11 (×5): 1 mg via INTRAMUSCULAR
  Administered 2011-06-11: 0.5 mg via INTRAMUSCULAR
  Administered 2011-06-12 (×2): 1 mg via INTRAMUSCULAR
  Filled 2011-06-10 (×8): qty 1

## 2011-06-10 MED ORDER — ACETAMINOPHEN 650 MG RE SUPP: 650.0000 mg | Freq: Four times a day (QID) | RECTAL | Status: DC | PRN

## 2011-06-10 MED ORDER — DOCUSATE SODIUM 100 MG PO CAPS
100.0000 mg | ORAL_CAPSULE | Freq: Two times a day (BID) | ORAL | Status: DC
  Administered 2011-06-10 – 2011-06-11 (×2): 100 mg via ORAL
  Filled 2011-06-10 (×7): qty 1

## 2011-06-10 MED ORDER — HYDROMORPHONE HCL PF 1 MG/ML IJ SOLN
0.5000 mg | INTRAMUSCULAR | Status: DC | PRN
  Filled 2011-06-10: qty 1

## 2011-06-10 MED ORDER — SODIUM CHLORIDE 0.9 % IV SOLN
INTRAVENOUS | Status: DC
  Administered 2011-06-10 – 2011-06-11 (×2): via INTRAVENOUS

## 2011-06-10 MED ORDER — ACETAMINOPHEN 325 MG PO TABS: 650.0000 mg | ORAL_TABLET | Freq: Four times a day (QID) | ORAL | Status: DC | PRN

## 2011-06-10 MED ORDER — INSULIN ASPART 100 UNIT/ML ~~LOC~~ SOLN
SUBCUTANEOUS | Status: AC
  Filled 2011-06-10: qty 3

## 2011-06-10 MED ORDER — PROMETHAZINE HCL 25 MG/ML IJ SOLN
25.0000 mg | Freq: Four times a day (QID) | INTRAMUSCULAR | Status: DC | PRN
  Filled 2011-06-10: qty 1

## 2011-06-10 MED ORDER — PROMETHAZINE HCL 25 MG/ML IJ SOLN
25.0000 mg | Freq: Once | INTRAMUSCULAR | Status: AC
  Administered 2011-06-10: 25 mg via INTRAMUSCULAR
  Filled 2011-06-10: qty 1

## 2011-06-10 MED ORDER — SODIUM CHLORIDE 0.9 % IJ SOLN
10.0000 mL | Freq: Two times a day (BID) | INTRAMUSCULAR | Status: DC
  Administered 2011-06-10 – 2011-06-11 (×3): 10 mL

## 2011-06-10 MED ORDER — BIOTENE DRY MOUTH MT LIQD
15.0000 mL | Freq: Two times a day (BID) | OROMUCOSAL | Status: DC
  Administered 2011-06-11 – 2011-06-12 (×3): 15 mL via OROMUCOSAL

## 2011-06-10 MED ORDER — HYDROMORPHONE HCL PF 1 MG/ML IJ SOLN
1.0000 mg | Freq: Once | INTRAMUSCULAR | Status: AC
  Administered 2011-06-10: 1 mg via INTRAMUSCULAR
  Filled 2011-06-10: qty 1

## 2011-06-10 MED ORDER — SODIUM CHLORIDE 0.9 % IJ SOLN: 10.0000 mL | INTRAMUSCULAR | Status: DC | PRN

## 2011-06-10 NOTE — H&P (Signed)
PCP:   No primary provider on file.   Chief Complaint:  Nausea and vomiting.  HPI: Patient is a 40 year old African American female with past medical history significant for diabetes gastroparesis with multiple admissions to various facilities secondary to nausea vomiting and and abdominal pain. She stated that she's had this problem for quite some time. She stated that she's had a PEG tube placed for time secondary to continued nausea vomiting. She stated that her symptoms started today. She has had no hematemesis she has had no fevers or. She does complain of chills. She has not been able to take any of her medications.  Review of Systems:  The patient denies anorexia, fever, weight loss,, vision loss, decreased hearing, hoarseness, chest pain, syncope, dyspnea on exertion, peripheral edema, balance deficits, hemoptysis, melena, hematochezia, severe indigestion/heartburn, hematuria, incontinence, genital sores, muscle weakness, suspicious skin lesions, transient blindness, difficulty walking, depression, unusual weight change, abnormal bleeding, enlarged lymph nodes, angioedema, and breast masses.  Past Medical History: Past Medical History  Diagnosis Date  . Hypertension   . Diabetes mellitus   . Gastroparesis   . Asthma   . GERD (gastroesophageal reflux disease)   . Coronary artery disease   . h/o seizure   . Neuropathy   . Chronic pancreatitis   . Dyslipidemia    Past Surgical History  Procedure Date  . Abdominal hysterectomy   . Back surgery   . Cholecystectomy   . Peg tube x 4     Medications: Patient states that there has been no change to her medications since her previous admission. Advair 250/50 twice a day Aspirin 81 mg daily Gabapentin 600 mg 4 times daily Humalog 10 units before every meal. Sliding-scale insulin Lipitor 80 mg each bedtime Lisinopril 20 mg daily Metoprolol 25 mg twice daily Nexium 40 mg twice daily Nitroglycerin sublingual when  necessary Plavix 75 mg daily Phenergan 25 mg every 6 hours when necessary Reglan 10 mg with each meal and at bedtime. Prior to Admission medications   Not on File    Allergies:   Allergies  Allergen Reactions  . Compazine Other (See Comments)    UNKNOWN  . Darvocet (Propoxyphene N-Acetaminophen) Other (See Comments)    UNKNOWN  . Nsaids Other (See Comments)    UNKNOWN  . Penicillins Other (See Comments)    UNKNOWN  . Zofran Other (See Comments)    UNKNOWN    Social History:  Reports that she has been smoking Cigarettes.  She has smoked for the past 2 years. She has never used smokeless tobacco. No: Use. She is divorced and has 5 children. She lives with her 5 children. She is presently on disability.  Family History: Mother died at 56 years old from cancer. Father is in his 84s he is hypertension.  Physical Exam: Filed Vitals:   06/10/11 1305  BP: 135/96  Pulse: 122  Temp: 98.7 F (37.1 C)  TempSrc: Oral  Resp: 20  SpO2: 100%   General: Patient appears her stated age. HEENT: Head is normocephalic atraumatic. Pupils reactive to light. Mild fluid of the left tympanic membrane. Cardiovascular: Tachycardic no murmurs or gallops. Lungs: Clear to auscultation bilaterally no wheezes rhonchi or rales. Abdomen: Mid abdominal scar soft diffusely tender absent bowel sounds. Extremities: No edema.   Labs on Admission:   [facility] 06/10/11 1404  NA 131*  K 4.8  CL 94*  CO2 23  GLUCOSE 337*  BUN 13  CREATININE 0.42*  CALCIUM 10.1  MG --  PHOS --  Basename 06/10/11 1404  AST 26  ALT 18  ALKPHOS 134*  BILITOT 0.3  PROT 8.1  ALBUMIN 3.3*    Basename 06/10/11 1404  LIPASE 21  AMYLASE --    Basename 06/10/11 1404  WBC 10.2  NEUTROABS 6.8  HGB 15.8*  HCT 45.1  MCV 87.2  PLT 200    Basename 06/10/11 1404  CKTOTAL 137  CKMB 1.9  CKMBINDEX --  TROPONINI 0.43*   No results found for this basename: TSH,T4TOTAL,FREET3,T3FREE,THYROIDAB in the last 72  hours No results found for this basename: VITAMINB12:2,FOLATE:2,FERRITIN:2,TIBC:2,IRON:2,RETICCTPCT:2 in the last 72 hours  Radiological Exams on Admission: Ct Abdomen Pelvis Wo Contrast  06/10/2011  *RADIOLOGY REPORT*  Clinical Data: Acute epigastric abdominal pain, nausea/vomiting  CT ABDOMEN AND PELVIS WITHOUT CONTRAST  Technique:  Multidetector CT imaging of the abdomen and pelvis was performed following the standard protocol without intravenous contrast.  Comparison: 01/13/2011  Findings: Lung bases are essentially clear.  Coronary atherosclerosis.  Unenhanced liver, spleen, pancreas, and adrenal glands within normal limits.  Status post cholecystectomy.  No intrahepatic or extrahepatic ductal dilatation.  Kidneys within normal limits.  No renal calculi or hydronephrosis.  No evidence of bowel obstruction. Filling of the left mid abdomen (series 2/image 42).  No evidence of abdominal aortic aneurysm.  No abdominopelvic ascites.  No suspicious abdominopelvic lymphadenopathy.  Status post hysterectomy.  No adnexal masses.  No ureteral or bladder calculi.  Bladder is thick-walled and notable for nondependent gas.  Prior ray cage fusion at L4-5.  Mild degenerative changes in the visualized thoracolumbar spine.  IMPRESSION: Bladder is thick-walled and notable for nondependent gas.  In the absence of recent intervention/catheterization, this appearance is suspicious for cystitis.  No renal, ureteral, or bladder calculi.  No hydronephrosis.  Original Report Authenticated By: Julian Hy, M.D.   Dg Abd Acute W/chest  06/10/2011  *RADIOLOGY REPORT*  Clinical Data: Nausea, vomiting, shortness of breath.  ACUTE ABDOMEN SERIES (ABDOMEN 2 VIEW & CHEST 1 VIEW)  Comparison: 02/16/2011  Findings: Prior cholecystectomy.  Nonspecific bowel gas pattern. Single mildly prominent small bowel loop in the lower pelvis. Surgical changes noted in the left abdomen.  No free air organomegaly.  No suspicious calcification.   Calcified phleboliths in the anatomic pelvis.  Heart and mediastinal contours are within normal limits.  No focal opacities or effusions.  No acute bony abnormality.  IMPRESSION: Nonspecific bowel gas pattern with a single prominent small bowel loop in the lower pelvis.  Original Report Authenticated By: Raelyn Number, M.D.    Assessment/Plan .Nausea and vomiting Bowel rest and supportive care.   Marland KitchenHTN (hypertension) When necessary hydralazine   .Chronic pancreatitis N.p.o. except for ice chips.   .Diabetes mellitus due to underlying condition with hyperglycemia Will put her on sliding scale insulin every 4 and check hemoglobin A1c   .GERD (gastroesophageal reflux disease) Continue PPI IV.  Marland Kitchen Gastroparesis Will treat her with IV Reglan and bowel rest.  Positive troponin Patient's troponin is elevated. However she does not have any chest pain or shortness of breath. Will order 2-D echo and will cycle her cardiac markers.  Her blood pressure is elevated over 200 so her positive troponins could be related to her blood pressure.  Will consult cardiology if they continue to trend upwards.  Mild  otitis media: Corticosporin otic  Rosana Hoes MD, Sharlet Salina 06/10/2011, 6:11 PM

## 2011-06-10 NOTE — ED Notes (Signed)
Pt has hx of gastroparesis.  Takes Reglan.  Sx started 10 am this morning. Pt refused for me to start an IV---stated that IV team always needs to start her IVs.  IV team beeped.

## 2011-06-10 NOTE — ED Notes (Signed)
MD at bedside. 

## 2011-06-10 NOTE — ED Notes (Signed)
IV team will be here after 7pm to put a PICC line at the bedside.

## 2011-06-10 NOTE — ED Provider Notes (Addendum)
History     CSN: [id number] Arrival date & time: 06/10/2011  1:01 PM   None     Chief Complaint  Patient presents with  . Abdominal Pain    (Consider location/radiation/quality/duration/timing/severity/associated sxs/prior treatment) HPI  This is a 40 year old lady with PMH of DM type II, diabetic gastroparesis, chronic pancreatitis, HTN, CHF and MI who presents to the ED with acute abdominal pain.  History was provided by patient. This history is limited due to acute distress. Patient states that she started to have acute abdomen pain at 10 AM this morning. Sharp pain, 10/10, mainly located in the epigastric area with radiation to her back, accompanied with nausea and vomiting with stomach content, no hematemesis.    Patient also reports dysuria, urinary frequency and urgency for a month. It is unclear why she didn't seek medical attention.   Of note, she had recent hospitalization in June for nausea vomiting and abdominal pain.   Past Medical History  Diagnosis Date  . Hypertension   . Diabetes mellitus   . Gastroparesis     Past Surgical History  Procedure Date  . Unknown     No family history on file.  History  Substance Use Topics  . Smoking status: Not on file  . Smokeless tobacco: Not on file  . Alcohol Use:      Pt unable to answer at this time--in too much pain.    OB History    Grav Para Term Preterm Abortions TAB SAB Ect Mult Living                  Review of Systems  All other systems reviewed and are negative.    Allergies  Compazine; Darvocet; Nsaids; Penicillins; Phenergan-codeine; and Zofran  Home Medications  No current outpatient prescriptions on file.  BP 135/96  Pulse 122  Temp(Src) 98.7 F (37.1 C) (Oral)  Resp 20  SpO2 100%  Physical Exam Physical examination is limited due to her noncooperation secondary to her abdominal pain  General: alert, well-developed, moderate distress. Head: normocephalic and atraumatic.  Eyes:  vision grossly intact, pupils equal, pupils round, pupils reactive to light, no injection and anicteric.  Mouth: pharynx pink and moist, no erythema, and no exudates.  Neck: supple, full ROM, no thyromegaly, no JVD, and no carotid bruits.  Lungs: normal respiratory effort, no accessory muscle use, normal breath sounds, no crackles, and no wheezes. Heart: normal rate, regular rhythm, no murmur, no gallop, and no rub.  Abdomen: soft, diffused tenderness noted,mainly located on Left epigastric area. Guarding noted. no rebound tenderness, BS x 4 active.  Left CVA tenderness.  Msk: no joint swelling, no joint warmth, and no redness over joints.  Pulses: 2+ DP/PT pulses bilaterally Extremities: No cyanosis, clubbing, edema Neurologic: alert & oriented X3,unable to perform neuro assessment.  ED Course  Procedures (including critical care time)  2:00 PM Pt c/o severe pain at left epigastric area with radiation to left back. Nausea and vomiting x1 now with stomach content.  Unsuccessful  IV insertion x 2 by IV team.  Will administer Dilaudid and Reglan IV for symptomatic treatment.  2:20PM Pt examined by Dr. Zenia Resides, Pt still c/o severe abd pain and nausea/vomiting. IV team called for PICC line insertion.       MDM   Attending note: pt with abdominal pain from chronic gastroparesis--abd exam with diffuse tenderness, will order pain meds, labs--expect admission     3:59 PM Pt rechecked and more comfortable, will give additional  dose of pain meds, labs reviewed, will likely be able to discharge  4:29 PM Pt with elevated troponin, ecg results noted as below, spoke with triad, will admit--no evidence of stemi at this time   Date: 06/10/2011  Rate: 100  Rhythm: normal sinus rhythm  QRS Axis: normal  Intervals: normal  ST/T Wave abnormalities: normal  Conduction Disutrbances:none  Narrative Interpretation:   Old EKG Reviewed: none available    Leota Jacobsen, MD 06/10/11  [place], MD 06/10/11 1646  Leota Jacobsen, MD 09/19/11 [phone]

## 2011-06-10 NOTE — ED Notes (Signed)
[id number] Expected date:06/10/11<BR> Expected time:12:22 PM<BR> Means of arrival:Ambulance<BR> Comments:<BR> M31 - 40yoF Abd Pain

## 2011-06-10 NOTE — ED Notes (Signed)
2unsuccessful attempts by IV team to gain IV access.

## 2011-06-10 NOTE — Discharge Planning (Signed)
ED CM noted admission RN note about pt having issues with medication and transportation. No pcp also listed.  Spoke with pt who confirms she is seen now at [facility] [place], [facility] [place].  Provided with contact information for medicaid transportation services and list of financial assistance resources plus local medicaid pcps for [employer] Pt noted to doze during visit She placed written information Cm provided in her purse. Pt listed with [employer] and medicaid coverage. Medicaid patients are offered low discounted medications ($2-3)

## 2011-06-10 NOTE — Progress Notes (Signed)
Consent for PICC obtained. Room assignment 1403. Patient report already called to floor. Ready for transfer to floor. Will insert PICC after transfer to room 1403 for patient comfort and per patient request. Spoke to ED RN about plan for insertion after transfer to floor. [place] notified to call IV Team upon patient arrival to floor. Marcello Moores, Keyairra Kolinski L

## 2011-06-10 NOTE — ED Notes (Signed)
Report called by day shift rn

## 2011-06-10 NOTE — ED Notes (Signed)
Report called to [facility] on day shift who will pass report to the night shift.  Pt will be going to 1403 after PICC line is inserted.

## 2011-06-10 NOTE — ED Notes (Signed)
Family at bedside. 

## 2011-06-10 NOTE — ED Notes (Signed)
Patient is resting comfortably. 

## 2011-06-10 NOTE — Progress Notes (Signed)
Pt had a 7-beat run of V-Tach @2140 . Pt was fixing her covers when RN enters the room. Pt states, "I'm just trying to get comfortable." Pt denies any chest pain or discomfort. BP is 136/91 at this time with a pulse rate of 103. M. Lynch notified at 2209 with no new orders received. Will continue to monitor. [place]

## 2011-06-10 NOTE — ED Notes (Signed)
Patient stable upon arrival to shift.  Patient being transported upstairs at this time.  Day shift rn called report.

## 2011-06-11 DIAGNOSIS — I509 Heart failure, unspecified: Secondary | ICD-10-CM

## 2011-06-11 LAB — BASIC METABOLIC PANEL
BUN: 19 mg/dL (ref 6–23)
CO2: 25 mEq/L (ref 19–32)
Calcium: 9.1 mg/dL (ref 8.4–10.5)
Chloride: 99 mEq/L (ref 96–112)
Creatinine, Ser: 0.57 mg/dL (ref 0.50–1.10)
GFR calc Af Amer: >90 mL/min (ref >90)
GFR calc non Af Amer: >90 mL/min (ref >90)
Glucose, Bld: 104 mg/dL — ABNORMAL HIGH (ref 70–99)
Sodium: 134 mEq/L — ABNORMAL LOW (ref 135–145)

## 2011-06-11 LAB — GLUCOSE, CAPILLARY
Glucose-Capillary: 139 mg/dL — ABNORMAL HIGH (ref 70–99)
Glucose-Capillary: 144 mg/dL — ABNORMAL HIGH (ref 70–99)

## 2011-06-11 LAB — HEMOGLOBIN A1C
Hgb A1c MFr Bld: 11.2 % — ABNORMAL HIGH (ref <5.7)
Mean Plasma Glucose: 275 mg/dL — ABNORMAL HIGH (ref <117)

## 2011-06-11 LAB — CARDIAC PANEL(CRET KIN+CKTOT+MB+TROPI)
Relative Index: 0.8 (ref 0.0–2.5)
Total CK: 304 U/L — ABNORMAL HIGH (ref 7–177)
Total CK: 296 U/L — ABNORMAL HIGH (ref 7–177)
Troponin I: <0.3 ng/mL (ref <0.30)

## 2011-06-11 LAB — CBC
MCH: 30.2 pg (ref 26.0–34.0)
Platelets: 162 10*3/uL (ref 150–400)
RBC: 4.5 MIL/uL (ref 3.87–5.11)
WBC: 8.7 10*3/uL (ref 4.0–10.5)

## 2011-06-11 MED ORDER — NEOMYCIN-POLYMYXIN-HC 3.5-10000-1 OT SUSP
3.0000 [drp] | Freq: Four times a day (QID) | OTIC | Status: DC
  Administered 2011-06-11 – 2011-06-12 (×6): 3 [drp] via OTIC

## 2011-06-11 MED ORDER — POTASSIUM CHLORIDE 10 MEQ/100ML IV SOLN
10.0000 meq | INTRAVENOUS | Status: AC
  Administered 2011-06-11 (×3): 10 meq via INTRAVENOUS
  Filled 2011-06-11 (×3): qty 100

## 2011-06-11 NOTE — Progress Notes (Signed)
  Echocardiogram 2D Echocardiogram has been performed.  [name] 06/11/2011, 10:51 AM

## 2011-06-11 NOTE — Progress Notes (Signed)
Subjective: Pt feels much better today  Objective: Vital signs in last 24 hours: Temp:  [98.6 F (37 C)-99.1 F (37.3 C)] 98.6 F (37 C) (11/10 0503) Pulse Rate:  [88-122] 88  (11/10 0503) Resp:  [18-20] 18  (11/10 0503) BP: (114-144)/(56-96) 114/75 mmHg (11/10 0503) SpO2:  [94 %-100 %] 96 % (11/10 0804) Weight:  [61.2 kg (134 lb 14.7 oz)-61.5 kg (135 lb 9.3 oz)] 135 lb 9.3 oz (61.5 kg) (11/10 0503) Weight change:   -Intake/Output from previous day: 11/09 0701 - 11/10 0700 In: 371.7 [I.V.:371.7] Out: -   Blood pressure 114/75, pulse 88, temperature 98.6 F (37 C), temperature source Oral, resp. rate 18, height 5\' 5"  (1.651 m), weight 61.5 kg (135 lb 9.3 oz), SpO2 96.00%. HEENT: normal CV: RRR Lungs:CTA bilaterally ABD: pos bowel sound EXT: no edema  Lab Results: Lab Results  Component Value Date   WBC 8.7 06/11/2011   HGB 13.6 06/11/2011   HCT 39.5 06/11/2011   MCV 87.8 06/11/2011   PLT 162 06/11/2011    BMET  Lab 06/11/11 0732  NA 134*  K 2.9*  CL 97  CO2 27  BUN 19  CREATININE 0.63  LABGLOM --  GLUCOSE 104*  CALCIUM 9.1    Studies/Results: Ct Abdomen Pelvis Wo Contrast  06/10/2011  *RADIOLOGY REPORT*  Clinical Data: Acute epigastric abdominal pain, nausea/vomiting  CT ABDOMEN AND PELVIS WITHOUT CONTRAST  Technique:  Multidetector CT imaging of the abdomen and pelvis was performed following the standard protocol without intravenous contrast.  Comparison: 01/13/2011  Findings: Lung bases are essentially clear.  Coronary atherosclerosis.  Unenhanced liver, spleen, pancreas, and adrenal glands within normal limits.  Status post cholecystectomy.  No intrahepatic or extrahepatic ductal dilatation.  Kidneys within normal limits.  No renal calculi or hydronephrosis.  No evidence of bowel obstruction. Filling of the left mid abdomen (series 2/image 42).  No evidence of abdominal aortic aneurysm.  No abdominopelvic ascites.  No suspicious abdominopelvic lymphadenopathy.   Status post hysterectomy.  No adnexal masses.  No ureteral or bladder calculi.  Bladder is thick-walled and notable for nondependent gas.  Prior ray cage fusion at L4-5.  Mild degenerative changes in the visualized thoracolumbar spine.  IMPRESSION: Bladder is thick-walled and notable for nondependent gas.  In the absence of recent intervention/catheterization, this appearance is suspicious for cystitis.  No renal, ureteral, or bladder calculi.  No hydronephrosis.  Original Report Authenticated By: Julian Hy, M.D.   Dg Abd Acute W/chest  06/10/2011  *RADIOLOGY REPORT*  Clinical Data: Nausea, vomiting, shortness of breath.  ACUTE ABDOMEN SERIES (ABDOMEN 2 VIEW & CHEST 1 VIEW)  Comparison: 02/16/2011  Findings: Prior cholecystectomy.  Nonspecific bowel gas pattern. Single mildly prominent small bowel loop in the lower pelvis. Surgical changes noted in the left abdomen.  No free air organomegaly.  No suspicious calcification.  Calcified phleboliths in the anatomic pelvis.  Heart and mediastinal contours are within normal limits.  No focal opacities or effusions.  No acute bony abnormality.  IMPRESSION: Nonspecific bowel gas pattern with a single prominent small bowel loop in the lower pelvis.  Original Report Authenticated By: Raelyn Number, M.D.    Medications:  Current Facility-Administered Medications  Medication Dose Route Frequency Provider Last Rate Last Dose  . 0.9 %  sodium chloride infusion   Intravenous Continuous Cleland Simkins Jarrett-Davis 50 mL/hr at 06/11/11 0507    . acetaminophen (TYLENOL) tablet 650 mg  650 mg Oral Q6H PRN Bastien Strawser Jarrett-Davis  Or  . acetaminophen (TYLENOL) suppository 650 mg  650 mg Rectal Q6H PRN Zanasia Hickson Jarrett-Davis      . antiseptic oral rinse (BIOTENE) solution 15 mL  15 mL Mouth Rinse BID Caelyn Route Jarrett-Davis   15 mL at 06/11/11 0800  . aspirin EC tablet 81 mg  81 mg Oral Daily Amr Sturtevant Jarrett-Davis   81 mg at 06/11/11 1032  . clopidogrel (PLAVIX) tablet 75 mg   75 mg Oral Q breakfast Kathlyne Loud Jarrett-Davis   75 mg at 06/11/11 0800  . docusate sodium (COLACE) capsule 100 mg  100 mg Oral BID Governor Matos Jarrett-Davis   100 mg at 06/11/11 1032  . enoxaparin (LOVENOX) injection 40 mg  40 mg Subcutaneous Q24H Jettie Mannor Jarrett-Davis   40 mg at 06/10/11 2235  . Fluticasone-Salmeterol (ADVAIR) 250-50 MCG/DOSE inhaler 1 puff  1 puff Inhalation BID Coben Godshall Jarrett-Davis   1 puff at 06/11/11 0802  . hydrALAZINE (APRESOLINE) injection 10 mg  10 mg Intravenous Q4H PRN Altan Kraai Jarrett-Davis      . HYDROmorphone (DILAUDID) injection 1 mg  1 mg Intramuscular Once Na Li, MD   1 mg at 06/10/11 1454  . HYDROmorphone (DILAUDID) injection 1 mg  1 mg Intramuscular Q4H PRN Dima Mini Jarrett-Davis   1 mg at 06/11/11 0855  . HYDROmorphone (DILAUDID) injection 2 mg  2 mg Intravenous Once Leota Jacobsen, MD   2 mg at 06/10/11 1600  . insulin aspart (novoLOG) injection 0-15 Units  0-15 Units Subcutaneous Q4H Jordynn Marcella Jarrett-Davis   2 Units at 06/11/11 0507  . insulin aspart (novoLOG) injection 8 Units  8 Units Subcutaneous To ER Leota Jacobsen, MD   8 Units at 06/10/11 1630  . metoCLOPramide (REGLAN) injection 10 mg  10 mg Intramuscular Once Na Li, MD   10 mg at 06/10/11 1455  . metoCLOPramide (REGLAN) injection 10 mg  10 mg Intravenous Q6H Bodhi Stenglein Jarrett-Davis   10 mg at 06/11/11 0507  . neomycin-polymyxin-hydrocortisone (CORTISPORIN) otic suspension 3 drop  3 drop Left Ear Q6H Quanita Barona Jarrett-Davis   3 drop at 06/11/11 0507  . pantoprazole (PROTONIX) injection 40 mg  40 mg Intravenous Q12H Melvin Marmo Jarrett-Davis   40 mg at 06/11/11 1032  . potassium chloride 10 mEq in 100 mL IVPB  10 mEq Intravenous Q1 Hr x 3 Navya Timmons Jarrett-Davis      . promethazine (PHENERGAN) tablet 12.5 mg  12.5 mg Oral Q6H PRN Kelan Pritt Jarrett-Davis       Or  . promethazine (PHENERGAN) injection 12.5 mg  12.5 mg Intravenous Q6H PRN Anglea Gordner Jarrett-Davis   12.5 mg at 06/11/11 0436  . promethazine (PHENERGAN) injection 25 mg  25  mg Intramuscular Once Leota Jacobsen, MD   25 mg at 06/10/11 1648  . promethazine (PHENERGAN) injection 25 mg  25 mg Intramuscular Q6H PRN Hartlee Amedee Jarrett-Davis      . sodium chloride 0.9 % injection 10 mL  10 mL Intracatheter Q12H Skya Mccullum Jarrett-Davis   10 mL at 06/11/11 1033  . sodium chloride 0.9 % injection 10 mL  10 mL Intracatheter PRN Mumtaz Lovins Jarrett-Davis      . DISCONTD: HYDROmorphone (DILAUDID) injection 0.5 mg  0.5 mg Intravenous Q3H PRN Toshiyuki Fredell Jarrett-Davis      . DISCONTD: insulin regular (HUMULIN R,NOVOLIN R) 100 units/mL injection 8 Units  8 Units Subcutaneous Once Leota Jacobsen, MD      . DISCONTD: neomycin-polymyxin-hydrocortisone (CORTISPORIN) otic solution 3 drop  3 drop Left Ear Q6H Ernesha Ramone Jarrett-Davis   3 drop at 06/11/11 0000  Assessment/Plan: Nausea and vomiting Improved continue supportive care.  Gastroparesis IV reglan   HTN (hypertension) Controlled   Chronic pancreatitis Still with Abdominal pain.  Bowel rest.   Diabetes mellitus due to underlying condition with hyperglycemia SSI   Dyslipidemia Hold statin   GERD (gastroesophageal reflux disease) PPI  Hypokalemia Replete   LOS: 1 day   Rosana Hoes MD, Sharlet Salina 06/11/2011, 10:48 AM

## 2011-06-11 NOTE — Plan of Care (Signed)
Can we stop con't q 8hrs cardiac enzymes? Pt has a PICC line now--can we administer the Dilaudid IVP now instead of IM routes?

## 2011-06-11 NOTE — Progress Notes (Signed)
No emesis today. Med. Several times for abd. Pain, right. 5-7/10.   Had one loose yellow stool.  Med x2 for c/o nausea with relief.

## 2011-06-12 LAB — CARDIAC PANEL(CRET KIN+CKTOT+MB+TROPI)
Relative Index: 1 (ref 0.0–2.5)
Total CK: 286 U/L — ABNORMAL HIGH (ref 7–177)

## 2011-06-12 LAB — CBC
HCT: 37 % (ref 36.0–46.0)
Hemoglobin: 12.6 g/dL (ref 12.0–15.0)
MCH: 30.4 pg (ref 26.0–34.0)
MCHC: 34.1 g/dL (ref 30.0–36.0)

## 2011-06-12 LAB — BASIC METABOLIC PANEL
BUN: 14 mg/dL (ref 6–23)
Calcium: 8.8 mg/dL (ref 8.4–10.5)
GFR calc non Af Amer: >90 mL/min (ref >90)
Glucose, Bld: 111 mg/dL — ABNORMAL HIGH (ref 70–99)

## 2011-06-12 LAB — GLUCOSE, CAPILLARY

## 2011-06-12 MED ORDER — NEOMYCIN-POLYMYXIN-HC 3.5-10000-1 OT SUSP: 3.0000 [drp] | Freq: Four times a day (QID) | OTIC | Status: AC

## 2011-06-12 MED ORDER — PROMETHAZINE HCL 50 MG RE SUPP: 50.0000 mg | Freq: Four times a day (QID) | RECTAL | Status: DC | PRN

## 2011-06-12 MED ORDER — INSULIN GLARGINE 100 UNIT/ML ~~LOC~~ SOLN: 20.0000 [IU] | Freq: Every day | SUBCUTANEOUS | Status: DC

## 2011-06-12 MED ORDER — LISINOPRIL 10 MG PO TABS: 20.0000 mg | ORAL_TABLET | Freq: Every day | ORAL | Status: DC

## 2011-06-12 MED ORDER — GABAPENTIN 600 MG PO TABS: 600.0000 mg | ORAL_TABLET | Freq: Three times a day (TID) | ORAL | Status: DC

## 2011-06-12 MED ORDER — METOPROLOL SUCCINATE ER 50 MG PO TB24: 50.0000 mg | ORAL_TABLET | Freq: Every day | ORAL | Status: DC

## 2011-06-12 MED ORDER — ATORVASTATIN CALCIUM 40 MG PO TABS: 40.0000 mg | ORAL_TABLET | Freq: Every day | ORAL | Status: DC

## 2011-06-12 MED ORDER — INSULIN LISPRO 100 UNIT/ML ~~LOC~~ SOLN: SUBCUTANEOUS | Status: DC

## 2011-06-12 MED ORDER — HYDROMORPHONE HCL PF 1 MG/ML IJ SOLN
0.5000 mg | INTRAMUSCULAR | Status: DC | PRN
  Administered 2011-06-12 (×2): 1 mg via INTRAVENOUS
  Filled 2011-06-12 (×2): qty 1

## 2011-06-12 MED ORDER — PROMETHAZINE HCL 50 MG RE SUPP: 25.0000 mg | Freq: Four times a day (QID) | RECTAL | Status: DC | PRN

## 2011-06-12 MED ORDER — ESOMEPRAZOLE MAGNESIUM 40 MG PO PACK: 40.0000 mg | PACK | Freq: Every day | ORAL | Status: DC

## 2011-06-12 MED ORDER — HYDROCODONE-ACETAMINOPHEN 5-500 MG PO TABS: 1.0000 | ORAL_TABLET | Freq: Four times a day (QID) | ORAL | Status: AC | PRN

## 2011-06-12 MED ORDER — METOCLOPRAMIDE HCL 10 MG PO TABS: 10.0000 mg | ORAL_TABLET | Freq: Four times a day (QID) | ORAL | Status: DC

## 2011-06-12 MED ORDER — FLUTICASONE-SALMETEROL 250-50 MCG/DOSE IN AEPB: 1.0000 | INHALATION_SPRAY | Freq: Two times a day (BID) | RESPIRATORY_TRACT | Status: DC

## 2011-06-12 MED ORDER — ASPIRIN 81 MG PO TBEC: 81.0000 mg | DELAYED_RELEASE_TABLET | Freq: Every day | ORAL | Status: DC

## 2011-06-12 NOTE — Progress Notes (Signed)
Cm spoke with patient concerning PCP. [place] [id number]. CM provided pt with list of PCP providers in [phone].

## 2011-06-13 LAB — URINE CULTURE: Colony Count: >=100000

## 2011-06-13 NOTE — Discharge Summary (Signed)
DISCHARGE SUMMARY  [name]  MR#: [id number]  DOB:[birth year]  Date of Admission: 06/10/2011 Date of Discharge: 06/13/2011  Attending Physician:Davis MD, Sharlet Salina  Patient's PCP:No primary provider on file. Ask social work to help the patient find a primary care physician.  Consults:  none  Discharge Diagnoses: .Nausea and vomiting .HTN (hypertension) .Chronic pancreatitis .Diabetes mellitus due to underlying condition with hyperglycemia .GERD (gastroesophageal reflux disease) .Gastroparesis    Discharge Medication List as of 06/12/2011  4:45 PM    START taking these medications   Details  aspirin EC 81 MG EC tablet Take 1 tablet (81 mg total) by mouth daily., Starting 06/12/2011, Until Mon 06/11/12, Print    atorvastatin (LIPITOR) 40 MG tablet Take 1 tablet (40 mg total) by mouth daily., Starting 06/12/2011, Until Mon 06/11/12, Print    esomeprazole (NEXIUM) 40 MG packet Take 40 mg by mouth daily before breakfast., Starting 06/12/2011, Until Mon 06/11/12, Print    Fluticasone-Salmeterol (ADVAIR) 250-50 MCG/DOSE AEPB Inhale 1 puff into the lungs 2 (two) times daily., Starting 06/12/2011, Until Discontinued, Print    gabapentin (NEURONTIN) 600 MG tablet Take 1 tablet (600 mg total) by mouth 3 (three) times daily., Starting 06/12/2011, Until Mon 06/11/12, Print    HYDROcodone-acetaminophen (VICODIN) 5-500 MG per tablet Take 1 tablet by mouth every 6 (six) hours as needed for pain., Starting 06/12/2011, Until Wed 06/22/11, Print    insulin glargine (LANTUS) 100 UNIT/ML injection Inject 20 Units into the skin at bedtime., Starting 06/12/2011, Until Mon 06/11/12, Print    insulin lispro (HUMALOG) 100 UNIT/ML injection Sliding scale, Print    lisinopril (PRINIVIL) 10 MG tablet Take 2 tablets (20 mg total) by mouth daily., Starting 06/12/2011, Until Mon 06/11/12, Print    metoCLOPramide (REGLAN) 10 MG tablet Take 1 tablet (10 mg total) by mouth 4 (four) times daily.,  Starting 06/12/2011, Until Wed 06/22/11, Print    metoprolol (TOPROL XL) 50 MG 24 hr tablet Take 1 tablet (50 mg total) by mouth daily., Starting 06/12/2011, Until Mon 06/11/12, Print    neomycin-polymyxin-hydrocortisone (CORTISPORIN) 3.5-10000-1 otic suspension Place 3 drops into the left ear every 6 (six) hours., Starting 06/12/2011, Until Wed 06/22/11, Print      CONTINUE these medications which have CHANGED   Details  promethazine (PROMETHEGAN) 50 MG suppository Place 0.5 suppositories (25 mg total) rectally every 6 (six) hours as needed for nausea., Starting 06/12/2011, Until Sun 06/19/11, Print          Hospital Course: .Nausea and vomiting Patient presented with nausea vomiting secondary to gastroparesis. Patient states that this is a chronic problem that has been going on for 16 years. She's had multiple admissions to many different hospital secondary to this problem. She used to live in [facility] and also in [place] and she's been admitted multiple times there. She states that she wants to be discharged. She states that she has chronic abdominal pain secondary to acute pancreatitis and she normally just do bowel rest at home and after 2-3 days normally,she could gradually restart with sips and then advance as she tolerates it. She feels as if her vomiting and nausea are controlled at this point. Patient did not want to try solid foods prior to being discharged. She states that she cannot do solids right now she normally wait about 2-3 days before doing solids she'll just continue to do sips and clear liquids for the next few days at home.   Marland KitchenHTN (hypertension) Patient was discharged on her metoprolol and lisinopril.   Marland Kitchen  Chronic pancreatitis As mentioned above patient states that she has chronic abdominal pain secondary to her pancreatitis. She states that her pain is at this point is at baseline.   .Diabetes mellitus due to underlying condition with hyperglycemia Continue her  on Lantus and sliding scale  .GERD (gastroesophageal reflux disease) Continue Nexium   .Gastroparesis: Reglan and slowly advance diet.  Day of Discharge BP 120/72  Pulse 85  Temp(Src) 98.2 F (36.8 C) (Oral)  Resp 18  Ht 5\' 5"  (1.651 m)  Wt 61.1 kg (134 lb 11.2 oz)  BMI 22.42 kg/m2  SpO2 98%  Physical Exam: General: Patient appears older than her stated age. Cardiovascular: Regular rate rhythm. Lungs: Clear to auscultation bilaterally no wheezes rhonchi. Abdomen: Soft with diffuse tenderness positive bowel sounds. Extremities: No edema.  No results found for this or any previous visit (from the past 24 hour(s)).  Disposition: Home   Follow-up Appts: Discharge Orders    Future Orders Please Complete By Expires   Diet Carb Modified      Increase activity slowly         Follow-up with Dr. Darl Householder doctor in one weeks.  Time spent with patient and doing this discharge is approximately 45 minutes.    SignedRosana Hoes MD, Sharlet Salina 06/13/2011, 6:46 PM

## 2011-07-18 ENCOUNTER — Emergency Department (HOSPITAL_COMMUNITY)
Admission: EM | Admit: 2011-07-18 | Discharge: 2011-07-18 | Disposition: A | Discharge status: Home / Self Care | Payer: PRIVATE HEALTH INSURANCE | Attending: Emergency Medicine | Admitting: Emergency Medicine

## 2011-07-18 ENCOUNTER — Emergency Department (HOSPITAL_COMMUNITY): Payer: PRIVATE HEALTH INSURANCE

## 2011-07-18 ENCOUNTER — Encounter (HOSPITAL_COMMUNITY): Payer: Self-pay | Admitting: Emergency Medicine

## 2011-07-18 DIAGNOSIS — R10817 Generalized abdominal tenderness: Secondary | ICD-10-CM | POA: Insufficient documentation

## 2011-07-18 DIAGNOSIS — R112 Nausea with vomiting, unspecified: Secondary | ICD-10-CM | POA: Insufficient documentation

## 2011-07-18 DIAGNOSIS — Z794 Long term (current) use of insulin: Secondary | ICD-10-CM | POA: Insufficient documentation

## 2011-07-18 DIAGNOSIS — E119 Type 2 diabetes mellitus without complications: Secondary | ICD-10-CM | POA: Insufficient documentation

## 2011-07-18 DIAGNOSIS — I252 Old myocardial infarction: Secondary | ICD-10-CM | POA: Insufficient documentation

## 2011-07-18 DIAGNOSIS — R109 Unspecified abdominal pain: Secondary | ICD-10-CM | POA: Insufficient documentation

## 2011-07-18 DIAGNOSIS — Z9089 Acquired absence of other organs: Secondary | ICD-10-CM | POA: Insufficient documentation

## 2011-07-18 DIAGNOSIS — I10 Essential (primary) hypertension: Secondary | ICD-10-CM | POA: Insufficient documentation

## 2011-07-18 HISTORY — DX: Acute myocardial infarction, unspecified: I21.9

## 2011-07-18 LAB — COMPREHENSIVE METABOLIC PANEL WITH GFR
ALT: 8 U/L (ref 0–35)
AST: 14 U/L (ref 0–37)
Albumin: 3.1 g/dL — ABNORMAL LOW (ref 3.5–5.2)
Alkaline Phosphatase: 135 U/L — ABNORMAL HIGH (ref 39–117)
BUN: 17 mg/dL (ref 6–23)
CO2: 29 meq/L (ref 19–32)
Calcium: 10 mg/dL (ref 8.4–10.5)
Chloride: 92 meq/L — ABNORMAL LOW (ref 96–112)
Creatinine, Ser: 0.53 mg/dL (ref 0.50–1.10)
GFR calc Af Amer: >90 mL/min
GFR calc non Af Amer: >90 mL/min
Glucose, Bld: 302 mg/dL — ABNORMAL HIGH (ref 70–99)
Potassium: 3.2 meq/L — ABNORMAL LOW (ref 3.5–5.1)
Sodium: 133 meq/L — ABNORMAL LOW (ref 135–145)
Total Bilirubin: 0.4 mg/dL (ref 0.3–1.2)
Total Protein: 7.8 g/dL (ref 6.0–8.3)

## 2011-07-18 LAB — CBC
HCT: 44.9 % (ref 36.0–46.0)
Hemoglobin: 16.3 g/dL — ABNORMAL HIGH (ref 12.0–15.0)
MCH: 31.2 pg (ref 26.0–34.0)
MCHC: 36.3 g/dL — ABNORMAL HIGH (ref 30.0–36.0)
MCV: 85.9 fL (ref 78.0–100.0)
Platelets: 198 K/uL (ref 150–400)
RBC: 5.23 MIL/uL — ABNORMAL HIGH (ref 3.87–5.11)
RDW: 13 % (ref 11.5–15.5)
WBC: 12.7 K/uL — ABNORMAL HIGH (ref 4.0–10.5)

## 2011-07-18 LAB — DIFFERENTIAL
Basophils Absolute: 0 K/uL (ref 0.0–0.1)
Basophils Relative: 0 % (ref 0–1)
Eosinophils Absolute: 0 K/uL (ref 0.0–0.7)
Eosinophils Relative: 0 % (ref 0–5)
Lymphocytes Relative: 22 % (ref 12–46)
Lymphs Abs: 2.8 K/uL (ref 0.7–4.0)
Monocytes Absolute: 0.7 K/uL (ref 0.1–1.0)
Monocytes Relative: 5 % (ref 3–12)
Neutro Abs: 9.3 K/uL — ABNORMAL HIGH (ref 1.7–7.7)
Neutrophils Relative %: 73 % (ref 43–77)

## 2011-07-18 LAB — LIPASE, BLOOD: Lipase: 17 U/L (ref 11–59)

## 2011-07-18 MED ORDER — PROMETHAZINE HCL 25 MG RE SUPP: 25.0000 mg | Freq: Four times a day (QID) | RECTAL | Status: DC | PRN

## 2011-07-18 MED ORDER — OXYCODONE-ACETAMINOPHEN 5-325 MG PO TABS: 1.0000 | ORAL_TABLET | ORAL | Status: AC | PRN

## 2011-07-18 MED ORDER — FAMOTIDINE IN NACL 20-0.9 MG/50ML-% IV SOLN
20.0000 mg | Freq: Once | INTRAVENOUS | Status: AC
  Administered 2011-07-18: 20 mg via INTRAVENOUS
  Filled 2011-07-18: qty 50

## 2011-07-18 MED ORDER — DIPHENHYDRAMINE HCL 50 MG/ML IJ SOLN
25.0000 mg | Freq: Once | INTRAMUSCULAR | Status: AC
  Administered 2011-07-18: 25 mg via INTRAVENOUS
  Filled 2011-07-18: qty 1

## 2011-07-18 MED ORDER — TRIMETHOBENZAMIDE HCL 100 MG/ML IM SOLN
200.0000 mg | Freq: Once | INTRAMUSCULAR | Status: DC
  Filled 2011-07-18 (×2): qty 2

## 2011-07-18 MED ORDER — PROMETHAZINE HCL 25 MG PO TABS: 25.0000 mg | ORAL_TABLET | Freq: Four times a day (QID) | ORAL | Status: DC | PRN

## 2011-07-18 MED ORDER — SODIUM CHLORIDE 0.9 % IV BOLUS (SEPSIS)
1000.0000 mL | Freq: Once | INTRAVENOUS | Status: AC
  Administered 2011-07-18: 1000 mL via INTRAVENOUS

## 2011-07-18 MED ORDER — PROMETHAZINE HCL 25 MG/ML IJ SOLN
25.0000 mg | Freq: Once | INTRAMUSCULAR | Status: AC
  Administered 2011-07-18: 25 mg via INTRAVENOUS
  Filled 2011-07-18: qty 1

## 2011-07-18 MED ORDER — HYDROMORPHONE HCL PF 2 MG/ML IJ SOLN
2.0000 mg | Freq: Once | INTRAMUSCULAR | Status: AC
  Administered 2011-07-18: 2 mg via INTRAVENOUS
  Filled 2011-07-18: qty 1

## 2011-07-18 MED ORDER — SODIUM CHLORIDE 0.9 % IV SOLN
Freq: Once | INTRAVENOUS | Status: AC
  Administered 2011-07-18: 12:00:00 via INTRAVENOUS

## 2011-07-18 MED ORDER — SODIUM CHLORIDE 0.9 % IV BOLUS (SEPSIS): 1000.0000 mL | Freq: Once | INTRAVENOUS | Status: DC

## 2011-07-18 NOTE — ED Notes (Signed)
CP:[id number] Expected date:07/18/11<BR> Expected time: 8:00 AM<BR> Means of arrival:Ambulance<BR> Comments:<BR> n/v

## 2011-07-18 NOTE — ED Notes (Signed)
Patient transported to Ultrasound 

## 2011-07-18 NOTE — ED Provider Notes (Signed)
History     CSN: [id number] Arrival date & time: 07/18/2011  8:25 AM   First MD Initiated Contact with Patient 07/18/11 [phone]      Chief Complaint  Patient presents with  . Nausea  . Emesis  . Abdominal Pain    (Consider location/radiation/quality/duration/timing/severity/associated sxs/prior treatment) HPI Comments: Patient presents with upper abdominal pain and associated nausea and vomiting.  She's had a history of gastroparesis and chronic pancreatitis and states the symptoms are similar to past episodes.  Patient denies fevers.  Her emesis is not bloody or coffee-ground appearing.  Patient has attempted to use her Reglan and Phenergan home without relief her symptoms.  She has no pain medication at home at this time.  Patient states she's been taking her insulin as instructed despite her decreased by mouth intake.  She has had to have multiple PEG tubes in the past as well.  Patient is a 40 y.o. female presenting with abdominal pain. The history is provided by the patient. No language interpreter was used.  Abdominal Pain The primary symptoms of the illness include abdominal pain, nausea and vomiting. The primary symptoms of the illness do not include fever, fatigue, shortness of breath, diarrhea, hematemesis, hematochezia, dysuria, vaginal discharge or vaginal bleeding. The current episode started yesterday. The onset of the illness was gradual. The problem has been gradually worsening.  The patient states that she believes she is currently not pregnant. Symptoms associated with the illness do not include chills or back pain.    Past Medical History  Diagnosis Date  . Hypertension   . Diabetes mellitus   . Gastroparesis   . Asthma   . GERD (gastroesophageal reflux disease)   . Coronary artery disease   . h/o seizure   . Neuropathy   . Chronic pancreatitis   . Dyslipidemia   . MI (myocardial infarction)     Past Surgical History  Procedure Date  . Abdominal hysterectomy    . Back surgery   . Cholecystectomy   . Peg tube x 4   . Cesarean section     History reviewed. No pertinent family history.  History  Substance Use Topics  . Smoking status: Current Everyday Smoker -- 2 years    Types: Cigarettes  . Smokeless tobacco: Never Used  . Alcohol Use: 0.0 oz/week     1 beer daily    OB History    Grav Para Term Preterm Abortions TAB SAB Ect Mult Living                  Review of Systems  Constitutional: Negative.  Negative for fever, chills and fatigue.  HENT: Negative.   Eyes: Negative.  Negative for discharge and redness.  Respiratory: Negative.  Negative for cough and shortness of breath.   Cardiovascular: Negative.  Negative for chest pain.  Gastrointestinal: Positive for nausea, vomiting and abdominal pain. Negative for diarrhea, hematochezia and hematemesis.  Genitourinary: Negative.  Negative for dysuria, vaginal bleeding and vaginal discharge.  Musculoskeletal: Negative.  Negative for back pain.  Skin: Negative.  Negative for color change and rash.  Neurological: Negative.  Negative for syncope and headaches.  Hematological: Negative.  Negative for adenopathy.  Psychiatric/Behavioral: Negative.  Negative for confusion.  All other systems reviewed and are negative.    Allergies  Compazine; Darvocet; Nsaids; Penicillins; and Zofran  Home Medications   Current Outpatient Rx  Name Route Sig Dispense Refill  . ASPIRIN 81 MG PO TBEC Oral Take 1 tablet (81 mg  total) by mouth daily. 30 tablet 0  . ATORVASTATIN CALCIUM 40 MG PO TABS Oral Take 1 tablet (40 mg total) by mouth daily. 30 tablet 0  . ESOMEPRAZOLE MAGNESIUM 40 MG PO PACK Oral Take 40 mg by mouth daily before breakfast. 30 each 0  . FLUTICASONE-SALMETEROL 250-50 MCG/DOSE IN AEPB Inhalation Inhale 1 puff into the lungs 2 (two) times daily. 60 each 0  . GABAPENTIN 600 MG PO TABS Oral Take 1 tablet (600 mg total) by mouth 3 (three) times daily. 90 tablet 0  . INSULIN GLARGINE 100  UNIT/ML [place] SOLN Subcutaneous Inject 20 Units into the skin at bedtime. 10 mL 0    Do not restart until blood sugars are over 150  . LISINOPRIL 10 MG PO TABS Oral Take 2 tablets (20 mg total) by mouth daily. 30 tablet 0  . METOPROLOL SUCCINATE ER 50 MG PO TB24 Oral Take 1 tablet (50 mg total) by mouth daily. 30 tablet 0  . INSULIN LISPRO (HUMAN) 100 UNIT/ML [place] SOLN Subcutaneous Inject 10-20 Units into the skin 3 (three) times daily before meals. Sliding scale       BP 146/94  Pulse 102  Physical Exam  Constitutional: She is oriented to person, place, and time. She appears well-developed and well-nourished.  Non-toxic appearance. She does not have a sickly appearance.       The patient appears uncomfortable  HENT:  Head: Normocephalic and atraumatic.  Eyes: Conjunctivae, EOM and lids are normal. Pupils are equal, round, and reactive to light. No scleral icterus.  Neck: Trachea normal and normal range of motion. Neck supple.  Cardiovascular: Normal rate, regular rhythm and normal heart sounds.   Pulmonary/Chest: Effort normal and breath sounds normal.  Abdominal: Soft. Normal appearance. There is generalized tenderness. There is no rebound, no guarding and no CVA tenderness.  Musculoskeletal: Normal range of motion.  Neurological: She is alert and oriented to person, place, and time. She has normal strength.  Skin: Skin is warm, dry and intact. No rash noted.  Psychiatric: She has a normal mood and affect. Her behavior is normal. Judgment and thought content normal.    ED Course  Procedures (including critical care time)  Results for orders placed during the hospital encounter of 07/18/11  CBC      Component Value Range   WBC 12.7 (*) 4.0 - 10.5 (K/uL)   RBC 5.23 (*) 3.87 - 5.11 (MIL/uL)   Hemoglobin 16.3 (*) 12.0 - 15.0 (g/dL)   HCT 44.9  36.0 - 46.0 (%)   MCV 85.9  78.0 - 100.0 (fL)   MCH 31.2  26.0 - 34.0 (pg)   MCHC 36.3 (*) 30.0 - 36.0 (g/dL)   RDW 13.0  11.5 - 15.5 (%)    Platelets 198  150 - 400 (K/uL)  DIFFERENTIAL      Component Value Range   Neutrophils Relative 73  43 - 77 (%)   Neutro Abs 9.3 (*) 1.7 - 7.7 (K/uL)   Lymphocytes Relative 22  12 - 46 (%)   Lymphs Abs 2.8  0.7 - 4.0 (K/uL)   Monocytes Relative 5  3 - 12 (%)   Monocytes Absolute 0.7  0.1 - 1.0 (K/uL)   Eosinophils Relative 0  0 - 5 (%)   Eosinophils Absolute 0.0  0.0 - 0.7 (K/uL)   Basophils Relative 0  0 - 1 (%)   Basophils Absolute 0.0  0.0 - 0.1 (K/uL)  COMPREHENSIVE METABOLIC PANEL      Component  Value Range   Sodium 133 (*) 135 - 145 (mEq/L)   Potassium 3.2 (*) 3.5 - 5.1 (mEq/L)   Chloride 92 (*) 96 - 112 (mEq/L)   CO2 29  19 - 32 (mEq/L)   Glucose, Bld 302 (*) 70 - 99 (mg/dL)   BUN 17  6 - 23 (mg/dL)   Creatinine, Ser 0.53  0.50 - 1.10 (mg/dL)   Calcium 10.0  8.4 - 10.5 (mg/dL)   Total Protein 7.8  6.0 - 8.3 (g/dL)   Albumin 3.1 (*) 3.5 - 5.2 (g/dL)   AST 14  0 - 37 (U/L)   ALT 8  0 - 35 (U/L)   Alkaline Phosphatase 135 (*) 39 - 117 (U/L)   Total Bilirubin 0.4  0.3 - 1.2 (mg/dL)   GFR calc non Af Amer >90  >90 (mL/min)   GFR calc Af Amer >90  >90 (mL/min)  LIPASE, BLOOD      Component Value Range   Lipase 17  11 - 59 (U/L)   US Abdomen Complete  07/18/2011  *RADIOLOGY REPORT*  Clinical Data:  Abdominal pain, nausea and vomiting  COMPLETE ABDOMINAL ULTRASOUND  Comparison:  CT scan 06/10/2011  Findings:  Gallbladder: Surgically absent  Common bile duct:  Measures 9 mm in diameter probable post  Liver:  No focal hepatic mass.  Mild intrahepatic biliary ductal dilatation.  IVC:  Appears normal.  Pancreas:  No focal abnormality seen.  Spleen:  Measures 6 cm in length.  Normal echogenicity.  Right Kidney:  Measures 12.5 cm in length.  No diagnostic renal calculus.  Mild fullness of the upper pole collecting system without frank hydronephrosis.  Left Kidney:  Measures 13.3 cm in length.  No hydronephrosis or diagnostic renal calculus  Abdominal aorta:  No aneurysm identified.  Measures up to 1.8 cm in diameter.  IMPRESSION:  1.  Surgically absent gallbladder. 2.  Mild CBD dilatation probable post cholecystectomy.  Mild intrahepatic biliary ductal dilatation 3.  Mild fullness of the upper pole right renal collecting system without frank hydronephrosis.  No diagnostic renal calculus. 4.  No aortic aneurysm.  Original Report Authenticated By: Lahoma Crocker, M.D.        MDM  Patient with improvement in her symptoms here with the pain medicine and nausea medicine.  She is no longer vomiting and while her pain is not completely resolved it is much improved.  Patient does not appear to have any acute pancreatic process at this time.  Given her mild elevation in her white blood cell count I did obtain an ultrasound to look for any abscess or mass which is not evident.  The patient feels comfortable going home at this point in time with continued pain medication and nausea medicine at home.        Lezlie Octave, MD 07/18/11 [phone]

## 2011-07-18 NOTE — ED Notes (Addendum)
Hx of pancreatitis & gastroparesis. Took last phenergan supp. Yesterday. N/V and abdominal pain. Symptoms started yesterday. Pt states that she is allergic to zofran. CBG 298

## 2011-07-28 ENCOUNTER — Encounter (HOSPITAL_COMMUNITY): Payer: Self-pay | Admitting: Emergency Medicine

## 2011-07-28 ENCOUNTER — Emergency Department (HOSPITAL_COMMUNITY)
Admission: EM | Admit: 2011-07-28 | Discharge: 2011-07-28 | Disposition: A | Discharge status: Home / Self Care | Payer: PRIVATE HEALTH INSURANCE | Attending: Emergency Medicine | Admitting: Emergency Medicine

## 2011-07-28 ENCOUNTER — Emergency Department (HOSPITAL_COMMUNITY): Payer: PRIVATE HEALTH INSURANCE

## 2011-07-28 DIAGNOSIS — R269 Unspecified abnormalities of gait and mobility: Secondary | ICD-10-CM | POA: Insufficient documentation

## 2011-07-28 DIAGNOSIS — K861 Other chronic pancreatitis: Secondary | ICD-10-CM | POA: Insufficient documentation

## 2011-07-28 DIAGNOSIS — F411 Generalized anxiety disorder: Secondary | ICD-10-CM | POA: Insufficient documentation

## 2011-07-28 DIAGNOSIS — E785 Hyperlipidemia, unspecified: Secondary | ICD-10-CM | POA: Insufficient documentation

## 2011-07-28 DIAGNOSIS — I10 Essential (primary) hypertension: Secondary | ICD-10-CM | POA: Insufficient documentation

## 2011-07-28 DIAGNOSIS — W010XXA Fall on same level from slipping, tripping and stumbling without subsequent striking against object, initial encounter: Secondary | ICD-10-CM | POA: Insufficient documentation

## 2011-07-28 DIAGNOSIS — M545 Low back pain, unspecified: Secondary | ICD-10-CM | POA: Insufficient documentation

## 2011-07-28 DIAGNOSIS — J45909 Unspecified asthma, uncomplicated: Secondary | ICD-10-CM | POA: Insufficient documentation

## 2011-07-28 DIAGNOSIS — I252 Old myocardial infarction: Secondary | ICD-10-CM | POA: Insufficient documentation

## 2011-07-28 DIAGNOSIS — W19XXXA Unspecified fall, initial encounter: Secondary | ICD-10-CM

## 2011-07-28 DIAGNOSIS — M25559 Pain in unspecified hip: Secondary | ICD-10-CM | POA: Insufficient documentation

## 2011-07-28 DIAGNOSIS — K219 Gastro-esophageal reflux disease without esophagitis: Secondary | ICD-10-CM | POA: Insufficient documentation

## 2011-07-28 DIAGNOSIS — Z794 Long term (current) use of insulin: Secondary | ICD-10-CM | POA: Insufficient documentation

## 2011-07-28 DIAGNOSIS — E119 Type 2 diabetes mellitus without complications: Secondary | ICD-10-CM | POA: Insufficient documentation

## 2011-07-28 DIAGNOSIS — Z7982 Long term (current) use of aspirin: Secondary | ICD-10-CM | POA: Insufficient documentation

## 2011-07-28 DIAGNOSIS — IMO0002 Reserved for concepts with insufficient information to code with codable children: Secondary | ICD-10-CM | POA: Insufficient documentation

## 2011-07-28 DIAGNOSIS — Z79899 Other long term (current) drug therapy: Secondary | ICD-10-CM | POA: Insufficient documentation

## 2011-07-28 DIAGNOSIS — I2510 Atherosclerotic heart disease of native coronary artery without angina pectoris: Secondary | ICD-10-CM | POA: Insufficient documentation

## 2011-07-28 DIAGNOSIS — F17200 Nicotine dependence, unspecified, uncomplicated: Secondary | ICD-10-CM | POA: Insufficient documentation

## 2011-07-28 DIAGNOSIS — Y92009 Unspecified place in unspecified non-institutional (private) residence as the place of occurrence of the external cause: Secondary | ICD-10-CM | POA: Insufficient documentation

## 2011-07-28 MED ORDER — PROMETHAZINE HCL 25 MG/ML IJ SOLN
25.0000 mg | Freq: Once | INTRAMUSCULAR | Status: DC
  Filled 2011-07-28: qty 1

## 2011-07-28 MED ORDER — OXYCODONE-ACETAMINOPHEN 5-325 MG PO TABS
1.0000 | ORAL_TABLET | Freq: Once | ORAL | Status: AC
  Administered 2011-07-28: 1 via ORAL
  Filled 2011-07-28: qty 1

## 2011-07-28 MED ORDER — METOCLOPRAMIDE HCL 5 MG/ML IJ SOLN
5.0000 mg | Freq: Once | INTRAMUSCULAR | Status: AC
  Administered 2011-07-28: 5 mg via INTRAVENOUS
  Filled 2011-07-28: qty 2

## 2011-07-28 MED ORDER — METOCLOPRAMIDE HCL 5 MG/ML IJ SOLN: 10.0000 mg | Freq: Once | INTRAMUSCULAR | Status: DC

## 2011-07-28 MED ORDER — PROMETHAZINE HCL 25 MG/ML IJ SOLN
12.5000 mg | Freq: Once | INTRAMUSCULAR | Status: AC
  Administered 2011-07-28: 12.5 mg via INTRAMUSCULAR
  Filled 2011-07-28: qty 1

## 2011-07-28 MED ORDER — TRAMADOL HCL 50 MG PO TABS: 50.0000 mg | ORAL_TABLET | Freq: Four times a day (QID) | ORAL | Status: AC | PRN

## 2011-07-28 MED ORDER — PROMETHAZINE HCL 25 MG/ML IJ SOLN
25.0000 mg | Freq: Once | INTRAMUSCULAR | Status: AC
  Administered 2011-07-28: 25 mg via INTRAMUSCULAR

## 2011-07-28 MED ORDER — MORPHINE SULFATE 4 MG/ML IJ SOLN
4.0000 mg | Freq: Once | INTRAMUSCULAR | Status: AC
  Administered 2011-07-28: 4 mg via INTRAVENOUS
  Filled 2011-07-28: qty 1

## 2011-07-28 NOTE — ED Notes (Signed)
Rad Tech states that patient was unable to lie in the position needed in order to obtain xrays. Barnetta Chapel informed and is talking with patient.
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
## 2011-07-28 NOTE — ED Notes (Signed)
Family member with patient passed out in hallway, helped up by staff and given a chair. Advised family member to be seen and he refused. Given a sandwich and Coke.

## 2011-07-28 NOTE — ED Notes (Signed)
Per [facility] patient slid down 8 stairs on buttocks, found upstairs on arrival to home standing no deformity noted

## 2011-07-28 NOTE — ED Notes (Signed)
Attempted IV x2, Patina attempted x1 with no access. Paged IV team and relayed request.

## 2011-07-28 NOTE — ED Notes (Signed)
Bed:WHALA<BR> Expected date:07/28/11<BR> Expected time: 1:21 PM<BR> Means of arrival:Ambulance<BR> Comments:<BR> Fall-LSB

## 2011-07-28 NOTE — ED Notes (Signed)
Rad Tech states that they were unable to complete xray due to patient N/V

## 2011-07-30 NOTE — ED Provider Notes (Signed)
Medical screening examination/treatment/procedure(s) were performed by non-physician practitioner and as supervising physician I was immediately available for consultation/collaboration.  Virgel Manifold, MD 07/30/11 [phone]

## 2011-08-09 ENCOUNTER — Inpatient Hospital Stay (HOSPITAL_COMMUNITY)
Admission: EM | Admit: 2011-08-09 | Discharge: 2011-08-12 | DRG: 074 | Disposition: A | Discharge status: Home / Self Care | Payer: PRIVATE HEALTH INSURANCE | Attending: Internal Medicine | Admitting: Internal Medicine

## 2011-08-09 ENCOUNTER — Encounter (HOSPITAL_COMMUNITY): Payer: Self-pay

## 2011-08-09 ENCOUNTER — Inpatient Hospital Stay (HOSPITAL_COMMUNITY): Payer: PRIVATE HEALTH INSURANCE

## 2011-08-09 DIAGNOSIS — K861 Other chronic pancreatitis: Secondary | ICD-10-CM | POA: Diagnosis present

## 2011-08-09 DIAGNOSIS — I2510 Atherosclerotic heart disease of native coronary artery without angina pectoris: Secondary | ICD-10-CM | POA: Diagnosis present

## 2011-08-09 DIAGNOSIS — IMO0002 Reserved for concepts with insufficient information to code with codable children: Secondary | ICD-10-CM | POA: Diagnosis present

## 2011-08-09 DIAGNOSIS — E1149 Type 2 diabetes mellitus with other diabetic neurological complication: Principal | ICD-10-CM | POA: Diagnosis present

## 2011-08-09 DIAGNOSIS — R112 Nausea with vomiting, unspecified: Secondary | ICD-10-CM | POA: Diagnosis present

## 2011-08-09 DIAGNOSIS — F17200 Nicotine dependence, unspecified, uncomplicated: Secondary | ICD-10-CM | POA: Diagnosis present

## 2011-08-09 DIAGNOSIS — E785 Hyperlipidemia, unspecified: Secondary | ICD-10-CM | POA: Diagnosis present

## 2011-08-09 DIAGNOSIS — K219 Gastro-esophageal reflux disease without esophagitis: Secondary | ICD-10-CM | POA: Diagnosis present

## 2011-08-09 DIAGNOSIS — I10 Essential (primary) hypertension: Secondary | ICD-10-CM | POA: Diagnosis present

## 2011-08-09 DIAGNOSIS — Z794 Long term (current) use of insulin: Secondary | ICD-10-CM

## 2011-08-09 DIAGNOSIS — E1165 Type 2 diabetes mellitus with hyperglycemia: Secondary | ICD-10-CM | POA: Diagnosis present

## 2011-08-09 DIAGNOSIS — I252 Old myocardial infarction: Secondary | ICD-10-CM

## 2011-08-09 DIAGNOSIS — E876 Hypokalemia: Secondary | ICD-10-CM | POA: Diagnosis present

## 2011-08-09 DIAGNOSIS — K3184 Gastroparesis: Secondary | ICD-10-CM | POA: Diagnosis present

## 2011-08-09 DIAGNOSIS — E1142 Type 2 diabetes mellitus with diabetic polyneuropathy: Secondary | ICD-10-CM | POA: Diagnosis present

## 2011-08-09 DIAGNOSIS — J449 Chronic obstructive pulmonary disease, unspecified: Secondary | ICD-10-CM | POA: Diagnosis present

## 2011-08-09 DIAGNOSIS — J4489 Other specified chronic obstructive pulmonary disease: Secondary | ICD-10-CM | POA: Diagnosis present

## 2011-08-09 HISTORY — DX: Chronic obstructive pulmonary disease, unspecified: J44.9

## 2011-08-09 LAB — URINALYSIS, ROUTINE W REFLEX MICROSCOPIC
Leukocytes, UA: NEGATIVE
Nitrite: NEGATIVE
Protein, ur: >300 mg/dL — AB
Urobilinogen, UA: 0.2 mg/dL (ref 0.0–1.0)
pH: 6 (ref 5.0–8.0)

## 2011-08-09 LAB — COMPREHENSIVE METABOLIC PANEL
Albumin: 2.6 g/dL — ABNORMAL LOW (ref 3.5–5.2)
BUN: 13 mg/dL (ref 6–23)
Chloride: 93 mEq/L — ABNORMAL LOW (ref 96–112)
Creatinine, Ser: 0.46 mg/dL — ABNORMAL LOW (ref 0.50–1.10)
GFR calc Af Amer: >90 mL/min (ref >90)
GFR calc non Af Amer: >90 mL/min (ref >90)
Total Bilirubin: 0.2 mg/dL — ABNORMAL LOW (ref 0.3–1.2)

## 2011-08-09 LAB — BLOOD GAS, ARTERIAL
Acid-Base Excess: 8 mmol/L — ABNORMAL HIGH (ref 0.0–2.0)
O2 Saturation: 93.6 %
Patient temperature: 98.6
TCO2: 28 mmol/L (ref 0–100)

## 2011-08-09 LAB — URINE MICROSCOPIC-ADD ON

## 2011-08-09 LAB — LIPASE, BLOOD: Lipase: 26 U/L (ref 11–59)

## 2011-08-09 LAB — DIFFERENTIAL
Basophils Relative: 0 % (ref 0–1)
Monocytes Absolute: 0.5 10*3/uL (ref 0.1–1.0)
Monocytes Relative: 4 % (ref 3–12)
Neutro Abs: 8.2 10*3/uL — ABNORMAL HIGH (ref 1.7–7.7)

## 2011-08-09 LAB — BLOOD GAS, VENOUS
Patient temperature: 98.6
TCO2: 25.4 mmol/L (ref 0–100)
pCO2, Ven: 37.7 mmHg — ABNORMAL LOW (ref 45.0–50.0)
pH, Ven: 7.506 — ABNORMAL HIGH (ref 7.250–7.300)

## 2011-08-09 LAB — CBC
HCT: 38.3 % (ref 36.0–46.0)
Hemoglobin: 13 g/dL (ref 12.0–15.0)
MCH: 29 pg (ref 26.0–34.0)
MCHC: 33.9 g/dL (ref 30.0–36.0)

## 2011-08-09 LAB — POCT PREGNANCY, URINE: Preg Test, Ur: NEGATIVE

## 2011-08-09 MED ORDER — FLUTICASONE-SALMETEROL 250-50 MCG/DOSE IN AEPB
1.0000 | INHALATION_SPRAY | Freq: Two times a day (BID) | RESPIRATORY_TRACT | Status: DC
  Administered 2011-08-09 – 2011-08-12 (×6): 1 via RESPIRATORY_TRACT
  Filled 2011-08-09: qty 14

## 2011-08-09 MED ORDER — ONDANSETRON 8 MG/NS 50 ML IVPB
8.0000 mg | Freq: Once | INTRAVENOUS | Status: DC
  Filled 2011-08-09: qty 8

## 2011-08-09 MED ORDER — METOCLOPRAMIDE HCL 5 MG/ML IJ SOLN
10.0000 mg | Freq: Once | INTRAMUSCULAR | Status: AC
  Administered 2011-08-09: 10 mg via INTRAVENOUS
  Filled 2011-08-09: qty 2

## 2011-08-09 MED ORDER — PROMETHAZINE HCL 25 MG/ML IJ SOLN
12.5000 mg | INTRAMUSCULAR | Status: DC | PRN
  Filled 2011-08-09: qty 1

## 2011-08-09 MED ORDER — METOCLOPRAMIDE HCL 5 MG/ML IJ SOLN
10.0000 mg | Freq: Four times a day (QID) | INTRAMUSCULAR | Status: DC
  Administered 2011-08-10 – 2011-08-12 (×11): 10 mg via INTRAVENOUS
  Filled 2011-08-09 (×14): qty 2

## 2011-08-09 MED ORDER — PROMETHAZINE HCL 25 MG/ML IJ SOLN
25.0000 mg | Freq: Once | INTRAMUSCULAR | Status: AC
Start: 2011-08-09 — End: 2011-08-09
  Administered 2011-08-09: 25 mg via INTRAVENOUS
  Filled 2011-08-09: qty 1

## 2011-08-09 MED ORDER — SODIUM CHLORIDE 0.9 % IV SOLN
INTRAVENOUS | Status: DC
  Administered 2011-08-09: 19:00:00 via INTRAVENOUS

## 2011-08-09 MED ORDER — PROMETHAZINE HCL 25 MG/ML IJ SOLN
12.5000 mg | Freq: Four times a day (QID) | INTRAMUSCULAR | Status: DC | PRN
  Administered 2011-08-09 – 2011-08-12 (×8): 12.5 mg via INTRAVENOUS
  Filled 2011-08-09 (×8): qty 1

## 2011-08-09 MED ORDER — INFLUENZA VIRUS VACC SPLIT PF IM SUSP
0.5000 mL | INTRAMUSCULAR | Status: AC
  Administered 2011-08-10: 0.5 mL via INTRAMUSCULAR
  Filled 2011-08-09: qty 0.5

## 2011-08-09 MED ORDER — ONDANSETRON HCL 4 MG/2ML IJ SOLN
4.0000 mg | Freq: Once | INTRAMUSCULAR | Status: AC
  Administered 2011-08-09: 4 mg via INTRAVENOUS
  Filled 2011-08-09: qty 2

## 2011-08-09 MED ORDER — GI COCKTAIL ~~LOC~~: 30.0000 mL | Freq: Once | ORAL | Status: DC

## 2011-08-09 MED ORDER — ALBUTEROL SULFATE (5 MG/ML) 0.5% IN NEBU: 2.5000 mg | INHALATION_SOLUTION | RESPIRATORY_TRACT | Status: DC | PRN

## 2011-08-09 MED ORDER — HYDROMORPHONE HCL PF 1 MG/ML IJ SOLN
1.0000 mg | INTRAMUSCULAR | Status: DC | PRN
  Administered 2011-08-09 – 2011-08-12 (×19): 1 mg via INTRAVENOUS
  Filled 2011-08-09 (×19): qty 1

## 2011-08-09 MED ORDER — HYDROMORPHONE HCL PF 1 MG/ML IJ SOLN
1.0000 mg | Freq: Once | INTRAMUSCULAR | Status: AC
  Administered 2011-08-09: 1 mg via INTRAVENOUS

## 2011-08-09 MED ORDER — SODIUM CHLORIDE 0.9 % IV SOLN: 8.0000 mg/h | INTRAVENOUS | Status: DC

## 2011-08-09 MED ORDER — ACETAMINOPHEN 325 MG PO TABS: 650.0000 mg | ORAL_TABLET | Freq: Four times a day (QID) | ORAL | Status: DC | PRN

## 2011-08-09 MED ORDER — INSULIN ASPART 100 UNIT/ML ~~LOC~~ SOLN
0.0000 [IU] | SUBCUTANEOUS | Status: DC
  Administered 2011-08-10: 3 [IU] via SUBCUTANEOUS
  Administered 2011-08-10: 2 [IU] via SUBCUTANEOUS
  Administered 2011-08-10: 6 [IU] via SUBCUTANEOUS
  Administered 2011-08-10 (×2): 3 [IU] via SUBCUTANEOUS
  Administered 2011-08-10: 5 [IU] via SUBCUTANEOUS
  Administered 2011-08-11: 3 [IU] via SUBCUTANEOUS
  Administered 2011-08-11 (×5): 2 [IU] via SUBCUTANEOUS
  Filled 2011-08-09: qty 3

## 2011-08-09 MED ORDER — PROMETHAZINE HCL 25 MG PO TABS
12.5000 mg | ORAL_TABLET | Freq: Four times a day (QID) | ORAL | Status: DC | PRN
  Filled 2011-08-09: qty 1

## 2011-08-09 MED ORDER — HYDROMORPHONE HCL PF 2 MG/ML IJ SOLN
2.0000 mg | Freq: Once | INTRAMUSCULAR | Status: AC
  Administered 2011-08-09: 2 mg via INTRAVENOUS
  Filled 2011-08-09: qty 1

## 2011-08-09 MED ORDER — HYDROMORPHONE HCL PF 1 MG/ML IJ SOLN
1.0000 mg | INTRAMUSCULAR | Status: DC | PRN
  Administered 2011-08-09: 1 mg via INTRAVENOUS
  Filled 2011-08-09: qty 1

## 2011-08-09 MED ORDER — SODIUM CHLORIDE 0.9 % IV SOLN
999.0000 mL | Freq: Once | INTRAVENOUS | Status: AC
  Administered 2011-08-09: 999 mL via INTRAVENOUS
  Administered 2011-08-09: 08:00:00 via INTRAVENOUS
  Administered 2011-08-09: 999 mL via INTRAVENOUS

## 2011-08-09 MED ORDER — PROMETHAZINE HCL 25 MG/ML IJ SOLN
25.0000 mg | Freq: Four times a day (QID) | INTRAMUSCULAR | Status: DC | PRN
  Administered 2011-08-09: 25 mg via INTRAMUSCULAR
  Filled 2011-08-09: qty 1

## 2011-08-09 MED ORDER — ONDANSETRON HCL 4 MG/2ML IJ SOLN
INTRAMUSCULAR | Status: AC
  Administered 2011-08-09: 8 mg
  Filled 2011-08-09: qty 2

## 2011-08-09 MED ORDER — INSULIN GLARGINE 100 UNIT/ML ~~LOC~~ SOLN
10.0000 [IU] | Freq: Every day | SUBCUTANEOUS | Status: DC
  Administered 2011-08-10 – 2011-08-11 (×2): 10 [IU] via SUBCUTANEOUS
  Filled 2011-08-09: qty 3

## 2011-08-09 MED ORDER — ACETAMINOPHEN 650 MG RE SUPP: 650.0000 mg | Freq: Four times a day (QID) | RECTAL | Status: DC | PRN

## 2011-08-09 MED ORDER — PANTOPRAZOLE SODIUM 40 MG IV SOLR
8.0000 mg/h | INTRAVENOUS | Status: DC
Start: 2011-08-09 — End: 2011-08-09
  Administered 2011-08-09: 8 mg/h via INTRAVENOUS
  Filled 2011-08-09 (×4): qty 80

## 2011-08-09 MED ORDER — HYDROMORPHONE HCL PF 1 MG/ML IJ SOLN
1.0000 mg | Freq: Once | INTRAMUSCULAR | Status: AC
  Administered 2011-08-09: 1 mg via INTRAMUSCULAR
  Filled 2011-08-09: qty 1

## 2011-08-09 MED ORDER — PANTOPRAZOLE SODIUM 40 MG IV SOLR
40.0000 mg | Freq: Two times a day (BID) | INTRAVENOUS | Status: DC
  Administered 2011-08-10 – 2011-08-11 (×4): 40 mg via INTRAVENOUS
  Filled 2011-08-09 (×5): qty 40

## 2011-08-09 MED ORDER — GABAPENTIN 600 MG PO TABS
600.0000 mg | ORAL_TABLET | Freq: Three times a day (TID) | ORAL | Status: DC
  Administered 2011-08-10 – 2011-08-12 (×7): 600 mg via ORAL
  Filled 2011-08-09 (×10): qty 1

## 2011-08-09 MED ORDER — KCL IN DEXTROSE-NACL 20-5-0.9 MEQ/L-%-% IV SOLN
INTRAVENOUS | Status: DC
  Administered 2011-08-09 – 2011-08-11 (×5): via INTRAVENOUS
  Filled 2011-08-09 (×9): qty 1000

## 2011-08-09 MED ORDER — METOPROLOL SUCCINATE ER 50 MG PO TB24
50.0000 mg | ORAL_TABLET | Freq: Every day | ORAL | Status: DC
  Administered 2011-08-10 – 2011-08-12 (×3): 50 mg via ORAL
  Filled 2011-08-09 (×3): qty 1

## 2011-08-09 MED ORDER — HYDROMORPHONE HCL PF 1 MG/ML IJ SOLN
INTRAMUSCULAR | Status: AC
  Administered 2011-08-09: 1 mg via INTRAVENOUS
  Filled 2011-08-09: qty 1

## 2011-08-09 NOTE — ED Notes (Signed)
[id number] Expected date:<BR> Expected time:<BR> Means of arrival:<BR> Comments:<BR> EMS/abd pain/nausea and vomiting-hx pancreatitis

## 2011-08-09 NOTE — H&P (Signed)
[name] is an 42 y.o. female.    PCP: Does not have a PCP  Chief Complaint: Nausea, vomiting, since yesterday.  HPI: This is a 41 year old, African American female, with a past history of diabetes, diabetic gastroparesis, COPD, GERD, hypertension, who was admitted last to the hospital in November with similar complaints. Patient tells me, that she was in her usual state of health till yesterday when she started having nausea, vomiting. At this point in the history taking the patient started having severe nausea with retching. So history beyond this point was very limited. Patient could not cooperate with answering questions. In any case she also is complaining of upper abdominal pain. She told the nurse earlier that it was 8/10 in intensity. Initially, the emesis was yellowish-green. Subsequently, she saw some brownish material, but no red blood. Denies any diarrhea. No history of fever or chills. Unable to tell me if she ran out of her medications. History was limited   Prior to Admission medications   Medication Sig Start Date End Date Taking? Authorizing Provider  aspirin EC 81 MG EC tablet Take 1 tablet (81 mg total) by mouth daily. 06/12/11 06/11/12 Yes Novlet Jarrett-Davis  atorvastatin (LIPITOR) 40 MG tablet Take 1 tablet (40 mg total) by mouth daily. 06/12/11 06/11/12 Yes Novlet Jarrett-Davis  esomeprazole (NEXIUM) 40 MG packet Take 40 mg by mouth daily before breakfast. 06/12/11 06/11/12 Yes Novlet Jarrett-Davis  Fluticasone-Salmeterol (ADVAIR) 250-50 MCG/DOSE AEPB Inhale 1 puff into the lungs 2 (two) times daily. 06/12/11  Yes Novlet Jarrett-Davis  gabapentin (NEURONTIN) 600 MG tablet Take 1 tablet (600 mg total) by mouth 3 (three) times daily. 06/12/11 06/11/12 Yes Novlet Jarrett-Davis  insulin glargine (LANTUS) 100 UNIT/ML injection Inject 20 Units into the skin at bedtime. 06/12/11 06/11/12 Yes Novlet Jarrett-Davis  insulin lispro (HUMALOG) 100 UNIT/ML injection Inject 10-20  Units into the skin 3 (three) times daily before meals. Sliding scale 06/12/11  Yes Novlet Jarrett-Davis  lisinopril (PRINIVIL) 10 MG tablet Take 2 tablets (20 mg total) by mouth daily. 06/12/11 06/11/12 Yes Novlet Jarrett-Davis  metoprolol (TOPROL XL) 50 MG 24 hr tablet Take 1 tablet (50 mg total) by mouth daily. 06/12/11 06/11/12 Yes Novlet Jarrett-Davis    Allergies:  Allergies  Allergen Reactions  . Compazine Other (See Comments)    UNKNOWN  . Darvocet (Propoxyphene N-Acetaminophen) Other (See Comments)    UNKNOWN  . Nsaids Other (See Comments)    UNKNOWN  . Penicillins Other (See Comments)    UNKNOWN  . Zofran Other (See Comments)    UNKNOWN    Past Medical History  Diagnosis Date  . Hypertension   . Diabetes mellitus   . Gastroparesis   . Asthma   . GERD (gastroesophageal reflux disease)   . Coronary artery disease   . h/o seizure   . Neuropathy   . Chronic pancreatitis   . Dyslipidemia   . MI (myocardial infarction)   . HTN (hypertension)   . Diabetes mellitus   . MI, old   . COPD (chronic obstructive pulmonary disease)     Past Surgical History  Procedure Date  . Abdominal hysterectomy   . Back surgery   . Cholecystectomy   . Peg tube x 4   . Cesarean section     Social History:  reports that she has been smoking Cigarettes.  She has a .5 pack-year smoking history. She has never used smokeless tobacco. She reports that she does not drink alcohol or use illicit drugs.  Family  History: Patient unable to tell me.  Review of Systems - unable to do as patient was retching uncontrollably.  Physical Examination Blood pressure 152/72, pulse 80, temperature 98.5 F (36.9 C), temperature source Oral, resp. rate 20, SpO2 99.00%.  General appearance: alert, cooperative, appears stated age and no distress Head: Normocephalic, without obvious abnormality, atraumatic Eyes: negative Throat: extremely dry MM Neck: no adenopathy, no carotid bruit, no JVD, supple,  symmetrical, trachea midline and thyroid not enlarged, symmetric, no tenderness/mass/nodules Resp: clear to auscultation bilaterally Cardio: tachycardic, regular rhythm, S1, S2 normal, no murmur, click, rub or gallop GI: soft, tender diffusely in upper abdomen.Bowel sounds present. No masses present but exam limited as patient kept pushing my arm away. Extremities: extremities normal, atraumatic, no cyanosis or edema Pulses: 2+ and symmetric Skin: Skin color, texture, turgor normal. No rashes or lesions Neurologic: Grossly normal  Results for orders placed during the hospital encounter of 08/09/11 (from the past 48 hour(s))  CBC     Status: Abnormal   Collection Time   08/09/11 11:30 AM      Component Value Range Comment   WBC 10.8 (*) 4.0 - 10.5 (K/uL)    RBC 4.48  3.87 - 5.11 (MIL/uL)    Hemoglobin 13.0  12.0 - 15.0 (g/dL)    HCT 38.3  36.0 - 46.0 (%)    MCV 85.5  78.0 - 100.0 (fL)    MCH 29.0  26.0 - 34.0 (pg)    MCHC 33.9  30.0 - 36.0 (g/dL)    RDW 12.8  11.5 - 15.5 (%)    Platelets 193  150 - 400 (K/uL)   DIFFERENTIAL     Status: Abnormal   Collection Time   08/09/11 11:30 AM      Component Value Range Comment   Neutrophils Relative 76  43 - 77 (%)    Neutro Abs 8.2 (*) 1.7 - 7.7 (K/uL)    Lymphocytes Relative 19  12 - 46 (%)    Lymphs Abs 2.1  0.7 - 4.0 (K/uL)    Monocytes Relative 4  3 - 12 (%)    Monocytes Absolute 0.5  0.1 - 1.0 (K/uL)    Eosinophils Relative 0  0 - 5 (%)    Eosinophils Absolute 0.0  0.0 - 0.7 (K/uL)    Basophils Relative 0  0 - 1 (%)    Basophils Absolute 0.0  0.0 - 0.1 (K/uL)   COMPREHENSIVE METABOLIC PANEL     Status: Abnormal   Collection Time   08/09/11 11:30 AM      Component Value Range Comment   Sodium 132 (*) 135 - 145 (mEq/L)    Potassium 3.2 (*) 3.5 - 5.1 (mEq/L)    Chloride 93 (*) 96 - 112 (mEq/L)    CO2 28  19 - 32 (mEq/L)    Glucose, Bld 291 (*) 70 - 99 (mg/dL)    BUN 13  6 - 23 (mg/dL)    Creatinine, Ser 0.46 (*) 0.50 - 1.10 (mg/dL)      Calcium 8.4  8.4 - 10.5 (mg/dL)    Total Protein 6.8  6.0 - 8.3 (g/dL)    Albumin 2.6 (*) 3.5 - 5.2 (g/dL)    AST 11  0 - 37 (U/L)    ALT 11  0 - 35 (U/L)    Alkaline Phosphatase 111  39 - 117 (U/L)    Total Bilirubin 0.2 (*) 0.3 - 1.2 (mg/dL)    GFR calc non Af Amer >90  >  90 (mL/min)    GFR calc Af Amer >90  >90 (mL/min)   LIPASE, BLOOD     Status: Normal   Collection Time   08/09/11 11:30 AM      Component Value Range Comment   Lipase 26  11 - 59 (U/L)   LACTIC ACID, PLASMA     Status: Normal   Collection Time   08/09/11 11:30 AM      Component Value Range Comment   Lactic Acid, Venous 0.9  0.5 - 2.2 (mmol/L)   BLOOD GAS, VENOUS     Status: Abnormal   Collection Time   08/09/11 11:35 AM      Component Value Range Comment   pH, Ven 7.506 (*) 7.250 - 7.300     pCO2, Ven 37.7 (*) 45.0 - 50.0 (mmHg)    pO2, Ven 67.3 (*) 30.0 - 45.0 (mmHg)    Bicarbonate 29.6 (*) 20.0 - 24.0 (mEq/L)    TCO2 25.4  0 - 100 (mmol/L)    Acid-Base Excess 6.4 (*) 0.0 - 2.0 (mmol/L)    O2 Saturation 94.6      Patient temperature 98.6      Drawn by COLLECTED BY NURSE      Sample type VEIN     BLOOD GAS, ARTERIAL     Status: Abnormal   Collection Time   08/09/11 11:45 AM      Component Value Range Comment   FIO2 .21      pH, Arterial 7.477 (*) 7.350 - 7.400     pCO2 arterial 44.2  35.0 - 45.0 (mmHg)    pO2, Arterial 65.8 (*) 80.0 - 100.0 (mmHg)    Bicarbonate 32.3 (*) 20.0 - 24.0 (mEq/L)    TCO2 28.0  0 - 100 (mmol/L)    Acid-Base Excess 8.0 (*) 0.0 - 2.0 (mmol/L)    O2 Saturation 93.6      Patient temperature 98.6      Collection site LEFT BRACHIAL      Drawn by [id number]      Sample type ARTERIAL DRAW      Allens test (pass/fail) PASS  PASS     No results found.   Assessment/Plan  Active Problems:  Gastroparesis  Nausea and vomiting  HTN (hypertension)  Chronic pancreatitis  GERD (gastroesophageal reflux disease)  DM (diabetes mellitus), type 2, uncontrolled   #1 severe nausea and  vomiting: This is likely secondary to diabetic gastroparesis. We will treat her with Reglan intravenously every 6 hours. Aggressively hydrate her. Due to the abdominal pain an acute abdominal series will be checked. Of note she had a CT of her abdomen in November.  #2 questionable coffee-ground emesis: She could have had a mild Mallory-Weiss tear. CBCs will be checked every 6 hours. PPI will be given intravenously every 12 hours. If there is a drop in hemoglobin or if she has overt hematemesis gastroenterology will be consulted.  #3 diabetes mellitus, type II, possibly, uncontrolled: HbA1c will be checked. She'll be put on a low-dose of Lantus, as she will be kept n.p.o. Dextrose will be provided through the IV fluids.  #4 hypokalemia. Will be repleted intravenously.  #5 history of hypertension: Blood pressure will be monitored closely.  #6 history of chronic pancreatitis: Lipase level is normal today.  DVT, prophylaxis with SCDs.  Further management decisions will depend on results of testing and patient's response to treatment.  [facility] Pager [phone]  08/09/2011, 5:13 PM

## 2011-08-09 NOTE — ED Notes (Signed)
Patient is resting comfortably.  Lights are out.  Pt appears to be asleep w/NAD.  Will cont to monitor.

## 2011-08-09 NOTE — ED Notes (Signed)
Additional attempt to obtain labs w/out success. Pt being bolused presently with 2nd liter NS, has not yet had any urge to void.

## 2011-08-09 NOTE — ED Notes (Signed)
Pt. Asked to use void but can't. Pt. refused in & out cath. Nurse aware

## 2011-08-09 NOTE — ED Notes (Signed)
IV team paged and responded they are going to floor now with pt to start another IV,  Right thumb IV has swollen,  Cecil Cranker Rn  Transporting pt to floor and will convey to [employer]  to start IV

## 2011-08-09 NOTE — ED Notes (Signed)
MD aware of emesis , GI bleed. Protonix and phenergan ordered from pharmacy

## 2011-08-09 NOTE — ED Notes (Signed)
Unable to obtain labs at this time. Allowing bolus to infuse and then will reattempt

## 2011-08-09 NOTE — ED Notes (Signed)
Pt continues to throw up in increasing amts. Has received multiple doses of antiemetics and analgesia. EDP made aware. Pt to be admitted.

## 2011-08-09 NOTE — ED Notes (Signed)
Pt c/o n/v/d with low grade fever since last pm

## 2011-08-09 NOTE — ED Notes (Signed)
Pt reports n/v persistently since yesterday...buccal membranes dry. Color pale....diaphoretic.Marland KitchenMarland KitchenMarland Kitchen

## 2011-08-09 NOTE — ED Provider Notes (Signed)
History     CSN: [id number]  Arrival date & time 08/09/11  0702   First MD Initiated Contact with Patient 08/09/11 [phone]      Chief Complaint  Patient presents with  . Emesis  . Diarrhea  . Pancreatitis    (Consider location/radiation/quality/duration/timing/severity/associated sxs/prior treatment) HPI Patient presents with abdominal pain, nausea, vomiting. She notes that her symptoms began gradually approximately 2 hours ago. Since onset symptoms have been persistent despite of attempts at control with home medications. No clear provoking factors. The pain is described as diffuse abdominal crampiness, worse after vomiting, not improved by anything. The patient denies any fevers, chills, chest pain, dyspnea. Past Medical History  Diagnosis Date  . Hypertension   . Diabetes mellitus   . Gastroparesis   . Asthma   . GERD (gastroesophageal reflux disease)   . Coronary artery disease   . h/o seizure   . Neuropathy   . Chronic pancreatitis   . Dyslipidemia   . MI (myocardial infarction)   . HTN (hypertension)   . Diabetes mellitus   . MI, old   . COPD (chronic obstructive pulmonary disease)     Past Surgical History  Procedure Date  . Abdominal hysterectomy   . Back surgery   . Cholecystectomy   . Peg tube x 4   . Cesarean section     No family history on file.  History  Substance Use Topics  . Smoking status: Current Everyday Smoker -- 2 years    Types: Cigarettes  . Smokeless tobacco: Never Used  . Alcohol Use: 0.0 oz/week     1 beer daily    OB History    Grav Para Term Preterm Abortions TAB SAB Ect Mult Living                  Review of Systems  Constitutional:       HPI  HENT:       HPI otherwise negative  Eyes: Negative.   Respiratory:       HPI, otherwise negative  Cardiovascular:       HPI, otherwise nmegative  Gastrointestinal: Positive for nausea, vomiting and abdominal pain. Negative for diarrhea.  Genitourinary:       HPI, otherwise  negative  Musculoskeletal:       HPI, otherwise negative  Skin: Negative.   Neurological: Negative for syncope.    Allergies  Compazine; Darvocet; Nsaids; Penicillins; and Zofran  Home Medications   Current Outpatient Rx  Name Route Sig Dispense Refill  . ASPIRIN 81 MG PO TBEC Oral Take 1 tablet (81 mg total) by mouth daily. 30 tablet 0  . ATORVASTATIN CALCIUM 40 MG PO TABS Oral Take 1 tablet (40 mg total) by mouth daily. 30 tablet 0  . ESOMEPRAZOLE MAGNESIUM 40 MG PO PACK Oral Take 40 mg by mouth daily before breakfast. 30 each 0  . FLUTICASONE-SALMETEROL 250-50 MCG/DOSE IN AEPB Inhalation Inhale 1 puff into the lungs 2 (two) times daily. 60 each 0  . GABAPENTIN 600 MG PO TABS Oral Take 1 tablet (600 mg total) by mouth 3 (three) times daily. 90 tablet 0  . INSULIN GLARGINE 100 UNIT/ML [place] SOLN Subcutaneous Inject 20 Units into the skin at bedtime. 10 mL 0    Do not restart until blood sugars are over 150  . INSULIN LISPRO (HUMAN) 100 UNIT/ML [place] SOLN Subcutaneous Inject 10-20 Units into the skin 3 (three) times daily before meals. Sliding scale    . LISINOPRIL 10 MG  PO TABS Oral Take 2 tablets (20 mg total) by mouth daily. 30 tablet 0  . METOPROLOL SUCCINATE ER 50 MG PO TB24 Oral Take 1 tablet (50 mg total) by mouth daily. 30 tablet 0    BP 159/79  Pulse 111  Temp(Src) 99.1 F (37.3 C) (Oral)  Resp 16  SpO2 100%  Physical Exam  Nursing note and vitals reviewed. Constitutional: She is oriented to person, place, and time. She appears well-developed and well-nourished. No distress.  HENT:  Head: Normocephalic and atraumatic.  Eyes: Conjunctivae and EOM are normal.  Cardiovascular: Regular rhythm.  Tachycardia present.   Pulmonary/Chest: Effort normal and breath sounds normal. No stridor. No respiratory distress.  Abdominal: Normal appearance. She exhibits no distension. There is generalized tenderness. There is guarding.  Musculoskeletal: She exhibits no edema.  Neurological:  She is alert and oriented to person, place, and time. No cranial nerve deficit.  Skin: Skin is warm and dry.  Psychiatric: She has a normal mood and affect.   A/P: 41yo F w MMP (incl: panc, chronic abd pain, IDDM.  R/O DKA vs. Acute panc. ED Course  Procedures (including critical care time)   Labs Reviewed  CBC  DIFFERENTIAL  COMPREHENSIVE METABOLIC PANEL  LIPASE, BLOOD  URINALYSIS, ROUTINE W REFLEX MICROSCOPIC  POCT PREGNANCY, URINE  LACTIC ACID, PLASMA  BLOOD GAS, VENOUS   No results found.   No diagnosis found.   11:26 AM  Patient feeling moderately better.  IV access has been an issue, leading to delay in full eval.  1:06 PM Patient feels moderately better. MDM  History of gastroparesis, insulin-dependent diabetes, enteritis now presents with abdominal pain, nausea, vomiting. On exam the patient is uncomfortable with mild tachycardia and diffusely uncomfortable abdomen. The patient received IV fluids, multiple rounds of antibiotics and analgesia.  Patient labs notable for alkalosis, consistent with persistent emesis. Following 80 interventions the patient had bouts of improvement, but her symptoms recurred. The patient one episode of mild hematemesis, prompting provision of Protonix.  Persistent C. of her symptoms, the patient will be admitted for further evaluation and management.        Carmin Muskrat, MD 08/09/11 1651

## 2011-08-10 LAB — CBC
HCT: 38.9 % (ref 36.0–46.0)
HCT: 36.5 % (ref 36.0–46.0)
HCT: 36.2 % (ref 36.0–46.0)
Hemoglobin: 12.7 g/dL (ref 12.0–15.0)
Hemoglobin: 12.2 g/dL (ref 12.0–15.0)
MCH: 30.3 pg (ref 26.0–34.0)
MCH: 30 pg (ref 26.0–34.0)
MCH: 30.2 pg (ref 26.0–34.0)
MCHC: 35.2 g/dL (ref 30.0–36.0)
MCHC: 34.5 g/dL (ref 30.0–36.0)
MCHC: 33.7 g/dL (ref 30.0–36.0)
MCV: 86.9 fL (ref 78.0–100.0)
MCV: 88.1 fL (ref 78.0–100.0)
Platelets: 153 10*3/uL (ref 150–400)
RBC: 4.43 MIL/uL (ref 3.87–5.11)
RBC: 4.2 MIL/uL (ref 3.87–5.11)
RDW: 12.9 % (ref 11.5–15.5)
RDW: 13 % (ref 11.5–15.5)
RDW: 13 % (ref 11.5–15.5)
WBC: 6.8 10*3/uL (ref 4.0–10.5)

## 2011-08-10 LAB — GLUCOSE, CAPILLARY
Glucose-Capillary: 150 mg/dL — ABNORMAL HIGH (ref 70–99)
Glucose-Capillary: 168 mg/dL — ABNORMAL HIGH (ref 70–99)
Glucose-Capillary: 167 mg/dL — ABNORMAL HIGH (ref 70–99)

## 2011-08-10 LAB — TSH: TSH: 0.929 u[IU]/mL (ref 0.350–4.500)

## 2011-08-10 LAB — COMPREHENSIVE METABOLIC PANEL
ALT: 9 U/L (ref 0–35)
AST: 12 U/L (ref 0–37)
Albumin: 2.5 g/dL — ABNORMAL LOW (ref 3.5–5.2)
CO2: 28 mEq/L (ref 19–32)
Calcium: 8.4 mg/dL (ref 8.4–10.5)
Sodium: 134 mEq/L — ABNORMAL LOW (ref 135–145)
Total Protein: 6.5 g/dL (ref 6.0–8.3)

## 2011-08-10 MED ORDER — CIPROFLOXACIN IN D5W 200 MG/100ML IV SOLN
200.0000 mg | Freq: Two times a day (BID) | INTRAVENOUS | Status: DC
  Administered 2011-08-10 – 2011-08-12 (×5): 200 mg via INTRAVENOUS
  Filled 2011-08-10 (×6): qty 100

## 2011-08-10 NOTE — Progress Notes (Signed)
Subjective: Reports continued nausea and abd pain. Last emesis about 3am  Objective: Vital signs Filed Vitals:   08/09/11 2132 08/09/11 2314 08/10/11 0018 08/10/11 0437  BP: 164/103  120/80 105/64  Pulse: 117  95 88  Temp: 98.9 F (37.2 C)   98.5 F (36.9 C)  TempSrc: Oral   Oral  Resp: 20   19  Height: 5\' 5"  (1.651 m)     Weight: 60.691 kg (133 lb 12.8 oz)     SpO2: 95% 95%  96%   Weight change:  Last BM Date: 08/08/11  Intake/Output from previous day: 01/08 0701 - 01/09 0700 In: 956.3 [I.V.:956.3] Out: 900 [Urine:275; Emesis/NG output:625]     Physical Exam: General: Alert, awake, oriented x3, in no acute distress. HEENT: No bruits, no goiter. Mucus membranes of mouth pink but dry. PERRL Heart: Regular rate and rhythm, without murmurs, rubs, gallops. Lungs:  Normal effort. Breath sounds clear to auscultation bilaterally. No wheeze, rhonchi Abdomen: Soft,  Very tender throughout to the slightest touch particularly in upper left quadrant.  nondistended, positive bowel sounds. Extremities: No clubbing cyanosis or edema with positive pedal pulses. Neuro: Grossly intact, nonfocal. Speech clear    Lab Results: Basic Metabolic Panel:  Basename 08/10/11 0540 08/09/11 1130  NA 134* 132*  K 3.4* 3.2*  CL 97 93*  CO2 28 28  GLUCOSE 176* 291*  BUN 18 13  CREATININE 0.68 0.46*  CALCIUM 8.4 8.4  MG 1.4* --  PHOS -- --   Liver Function Tests:  Basename 08/10/11 0540 08/09/11 1130  AST 12 11  ALT 9 11  ALKPHOS 97 111  BILITOT 0.2* 0.2*  PROT 6.5 6.8  ALBUMIN 2.5* 2.6*    Basename 08/09/11 1130  LIPASE 26  AMYLASE --   No results found for this basename: AMMONIA:2 in the last 72 hours CBC:  Basename 08/10/11 0540 08/10/11 0042 08/09/11 1130  WBC 8.9 10.0 --  NEUTROABS -- -- 8.2*  HGB 13.3 13.7 --  HCT 38.5 38.9 --  MCV 86.9 86.1 --  PLT 153 188 --   Cardiac Enzymes: No results found for this basename: CKTOTAL:3,CKMB:3,CKMBINDEX:3,TROPONINI:3 in the  last 72 hours BNP: No results found for this basename: PROBNP:3 in the last 72 hours D-Dimer: No results found for this basename: DDIMER:2 in the last 72 hours CBG:  Basename 08/10/11 0726 08/10/11 0407 08/09/11 2224  GLUCAP 150* 208* 211*   Hemoglobin A1C:  Basename 08/10/11 0042  HGBA1C 11.3*   Fasting Lipid Panel: No results found for this basename: CHOL,HDL,LDLCALC,TRIG,CHOLHDL,LDLDIRECT in the last 72 hours Thyroid Function Tests:  Basename 08/10/11 0042  TSH 0.929  T4TOTAL --  FREET4 --  T3FREE --  THYROIDAB --   Anemia Panel: No results found for this basename: VITAMINB12,FOLATE,FERRITIN,TIBC,IRON,RETICCTPCT in the last 72 hours Coagulation: No results found for this basename: LABPROT:2,INR:2 in the last 72 hours Urine Drug Screen: Drugs of Abuse     Component Value Date/Time   LABOPIA NONE DETECTED 06/10/2011 [place] 06/10/2011 1423   LABBENZ POSITIVE* 06/10/2011 1423   AMPHETMU NONE DETECTED 06/10/2011 1423   THCU POSITIVE* 06/10/2011 1423   LABBARB NONE DETECTED 06/10/2011 1423    Alcohol Level: No results found for this basename: ETH:2 in the last 72 hours Urinalysis:  Misc. Labs:  No results found for this or any previous visit (from the past 240 hour(s)).  Studies/Results: Acute Abdominal Series  08/09/2011  *RADIOLOGY REPORT*  Clinical Data: Nausea and vomiting  ACUTE ABDOMEN  SERIES (ABDOMEN 2 VIEW & CHEST 1 VIEW)  Comparison: Acute abdominal series 06/10/2011  Findings: Normal cardiac silhouette. Coronary artery stents noted. Prominence of the right first costal sternal junction is unchanged from prior.  No dilated loops of large or small bowel.  Surgical clips within the abdomen, left upper quadrant.  Cholecystectomy clips noted. There is gas in the rectum.  IMPRESSION:  1.  No acute cardiopulmonary process. 2.  No evidence of bowel obstruction or intraperitoneal free air.  Original Report Authenticated By: Suzy Bouchard, M.D.     Medications: Scheduled Meds:   . sodium chloride  999 mL Intravenous Once  . Fluticasone-Salmeterol  1 puff Inhalation BID  . gabapentin  600 mg Oral TID  .  HYDROmorphone (DILAUDID) injection  1 mg Intravenous Once  .  HYDROmorphone (DILAUDID) injection  2 mg Intravenous Once  . influenza  inactive virus vaccine  0.5 mL Intramuscular Tomorrow-1000  . insulin aspart  0-15 Units Subcutaneous Q4H  . insulin glargine  10 Units Subcutaneous QHS  . metoCLOPramide (REGLAN) injection  10 mg Intravenous Once  . metoCLOPramide (REGLAN) injection  10 mg Intravenous Q6H  . metoprolol  50 mg Oral Daily  . ondansetron      . ondansetron (ZOFRAN) IV  4 mg Intravenous Once  . pantoprazole (PROTONIX) IV  40 mg Intravenous Q12H  . promethazine  25 mg Intravenous Once  . DISCONTD: sodium chloride   Intravenous STAT  . DISCONTD: gi cocktail  30 mL Oral Once  . DISCONTD: ondansetron (ZOFRAN) IV  8 mg Intravenous Once   Continuous Infusions:   . dextrose 5 % and 0.9 % NaCl with KCl 20 mEq/L 125 mL/hr at 08/10/11 0656  . DISCONTD: pantoprozole (PROTONIX) infusion 8 mg/hr (08/09/11 1437)  . DISCONTD: pantoprozole (PROTONIX) infusion     PRN Meds:.acetaminophen, acetaminophen, albuterol, HYDROmorphone, promethazine, promethazine, DISCONTD:  HYDROmorphone (DILAUDID) injection, DISCONTD: promethazine, DISCONTD: promethazine  Assessment/Plan:  Active Problems: #1 severe nausea and vomiting: This is likely secondary to diabetic gastroparesis. We will treat her with Reglan intravenously every 6 hours. Aggressively hydrate her. Acute abdominal series negative. Of note she had a CT of her abdomen in November. Last emesis 3am but remains nauseated.  #2 questionable coffee-ground emesis: She could have had a mild Mallory-Weiss tear. CBCs will be checked every 6 hours. PPI will be given intravenously every 12 hours.  Hg stable13-13.7 range. Will continue to monitor closely 3. UTI. Will send culture and give  cipro.  #3 diabetes mellitus, type II, Uncontrolled: HbA1c 11.3  Will continue low-dose of Lantus, as she remains n.p.o. Dextrose will be provided through the IV fluids.  #4 hypokalemia. Will be repleted intravenously. Improved. Continue to monitor. Likely due to #1.  #5 history of hypertension: poor control when meds held. Will resume home BB  #6 history of chronic pancreatitis: Lipase level is normal today. Continue to monitor DVT, prophylaxis with SCDs.      LOS: 1 day   [facility] M 08/10/2011, 9:15 AM

## 2011-08-10 NOTE — Progress Notes (Signed)
   CARE MANAGEMENT NOTE 08/10/2011  Patient:  [name], [name]   Account Number:  [id number]  Date Initiated:  08/10/2011  Documentation initiated by:  [facility]  Subjective/Objective Assessment:   ADMITTED W/N/V.     Action/Plan:   FROM HOME   Anticipated DC Date:  08/13/2011   Anticipated DC Plan:  HOME/SELF CARE         Choice offered to / List presented to:             Status of service:  In process, will continue to follow Medicare Important Message given?   (If response is "NO", the following Medicare IM given date fields will be blank) Date Medicare IM given:   Date Additional Medicare IM given:    Discharge Disposition:    Per UR Regulation:  Reviewed for med. necessity/level of care/duration of stay  Comments:  08/10/11 [facility] Wilborn Membreno RN,BSN NCM [phone]

## 2011-08-10 NOTE — Progress Notes (Signed)
Patient seen and examined ,c/o epigastric pain ,slowley improving .reported  Streaks of brownish material with vomiting ,denies red blood . Agree with the above,keep NPO ,IVF ,REGLAN Patient is asked to monitor BP at home or work, several times per month and return with written values at next office visit. CBC and continue protonix . Low threshold for GI consult if no improvement .

## 2011-08-11 LAB — BASIC METABOLIC PANEL
CO2: 25 mEq/L (ref 19–32)
Calcium: 7.9 mg/dL — ABNORMAL LOW (ref 8.4–10.5)
Creatinine, Ser: 0.51 mg/dL (ref 0.50–1.10)
GFR calc non Af Amer: >90 mL/min (ref >90)
Glucose, Bld: 153 mg/dL — ABNORMAL HIGH (ref 70–99)
Sodium: 137 mEq/L (ref 135–145)

## 2011-08-11 LAB — URINE CULTURE: Colony Count: 70000

## 2011-08-11 LAB — GLUCOSE, CAPILLARY
Glucose-Capillary: 114 mg/dL — ABNORMAL HIGH (ref 70–99)
Glucose-Capillary: 165 mg/dL — ABNORMAL HIGH (ref 70–99)

## 2011-08-11 MED ORDER — PANTOPRAZOLE SODIUM 40 MG IV SOLR
40.0000 mg | INTRAVENOUS | Status: DC
  Filled 2011-08-11: qty 40

## 2011-08-11 MED ORDER — POTASSIUM CHLORIDE IN NACL 20-0.9 MEQ/L-% IV SOLN
INTRAVENOUS | Status: DC
Start: 2011-08-11 — End: 2011-08-12
  Administered 2011-08-11 – 2011-08-12 (×2): via INTRAVENOUS
  Filled 2011-08-11 (×4): qty 1000

## 2011-08-11 NOTE — Progress Notes (Signed)
Patient seen and examined,agree with the above assessment and plan

## 2011-08-11 NOTE — Progress Notes (Signed)
PATIENT DETAILS Name: [name] Age: 41 y.o. Sex: female Date of Birth: Dec 19, 1970 Admit Date: 08/09/2011 PCP:No primary provider on file. POA:   CONSULTS: None  Subjective: Denies any vomiting. Persistant nausea x 1 month despite Reglan and Phenergan. Pt hoping for d/c on 1/11 as she has 5 children that need to be cared for.    Objective: Vital signs in last 24 hours: Temp:  [98.2 F (36.8 C)-99 F (37.2 C)] 98.2 F (36.8 C) (01/10 1351) Pulse Rate:  [83-88] 85  (01/10 1351) Resp:  [16-18] 18  (01/10 1351) BP: (91-138)/(61-89) 138/89 mmHg (01/10 1351) SpO2:  [96 %-99 %] 99 % (01/10 1351)  Last BM Date: 08/08/11  Intake/Output from previous day:  Intake/Output Summary (Last 24 hours) at 08/11/11 1440 Last data filed at 08/11/11 0527  Gross per 24 hour  Intake   3030 ml  Output    800 ml  Net   2230 ml     Physical Exam:  Gen:  Awake, alert in NAD Cardiovascular:  S1S2 RRR, no m/r/g  Respiratory: CTAB, no w/r/c. No increased WOB Gastrointestinal: Abd soft, ND. Very mild tenderness, no rebound or guarding. BS+ Extremities: no c/c/e   Lab Results: @cprlabs @ No results found for this or any previous visit (from the past 240 hour(s)).  Studies/Results: Acute Abdominal Series  08/09/2011  *RADIOLOGY REPORT*  Clinical Data: Nausea and vomiting  ACUTE ABDOMEN SERIES (ABDOMEN 2 VIEW & CHEST 1 VIEW)  Comparison: Acute abdominal series 06/10/2011  Findings: Normal cardiac silhouette. Coronary artery stents noted. Prominence of the right first costal sternal junction is unchanged from prior.  No dilated loops of large or small bowel.  Surgical clips within the abdomen, left upper quadrant.  Cholecystectomy clips noted. There is gas in the rectum.  IMPRESSION:  1.  No acute cardiopulmonary process. 2.  No evidence of bowel obstruction or intraperitoneal free air.  Original Report Authenticated By: Suzy Bouchard, M.D.    Medications: Scheduled Meds:   . ciprofloxacin   200 mg Intravenous Q12H  . Fluticasone-Salmeterol  1 puff Inhalation BID  . gabapentin  600 mg Oral TID  . insulin aspart  0-15 Units Subcutaneous Q4H  . insulin glargine  10 Units Subcutaneous QHS  . metoCLOPramide (REGLAN) injection  10 mg Intravenous Q6H  . metoprolol  50 mg Oral Daily  . pantoprazole (PROTONIX) IV  40 mg Intravenous Q12H   Continuous Infusions:   . dextrose 5 % and 0.9 % NaCl with KCl 20 mEq/L 125 mL/hr at 08/11/11 0756   PRN Meds:.acetaminophen, acetaminophen, albuterol, HYDROmorphone, promethazine, promethazine Antibiotics: Anti-infectives     Start     Dose/Rate Route Frequency Ordered Stop   08/10/11 1130   ciprofloxacin (CIPRO) IVPB 200 mg        200 mg 100 mL/hr over 60 Minutes Intravenous Every 12 hours 08/10/11 1042             Assessment/Plan:  1. Persistant nausea and vomiting: Symptoms felt secondary to gastroparesis in setting of uncontrolled DM2. Vomiting has resolved >24 hours. Abd series unremarkable with negative CT in 06/2011 Plan: Will advance diet as tolerated starting with clear liquids. Continue Reglan (will change to po if pt tolerates diet), supportive care. Pt education on small, frequent meals and importance of controlling BS  2. Question of hematemesis: now resolved. Hgb has remained stable throughout hospitalization. Possible MW tear with above, but no further w/u planned since resolved and hgb stable. Plan: change PPI to daily. Change  to po if tolerates diet  3. UTI: Urine culture pending Plan: cont Cipro D2.   4. DM2, uncontrolled prior to admission:  A1C 11.3%. Pt needs to establish a PCP in town for management of diabetes. Current CBG's 150-170s. Plan: cont SSI, Lantus  5. Hypokalemia:  secondary to GI loss. Resolved with repletion in IVFs c K+ of 3.7 today. Plan: cont in IVFs, check in am  6. Hx htn:  stable on BB  7. Hx pancreatitis:  Lipase normal on admit  8. Dispo: if able to tolerate po's, consider d/c  1/11 with close outpt f/u  Patrici Ranks, NP  [phone]     LOS: 2 days    08/11/2011, 2:40 PM

## 2011-08-12 LAB — BASIC METABOLIC PANEL
BUN: 5 mg/dL — ABNORMAL LOW (ref 6–23)
Creatinine, Ser: 0.48 mg/dL — ABNORMAL LOW (ref 0.50–1.10)
GFR calc Af Amer: >90 mL/min (ref >90)
GFR calc non Af Amer: >90 mL/min (ref >90)
Glucose, Bld: 111 mg/dL — ABNORMAL HIGH (ref 70–99)

## 2011-08-12 LAB — GLUCOSE, CAPILLARY
Glucose-Capillary: 181 mg/dL — ABNORMAL HIGH (ref 70–99)
Glucose-Capillary: 138 mg/dL — ABNORMAL HIGH (ref 70–99)
Glucose-Capillary: 77 mg/dL (ref 70–99)
Glucose-Capillary: 129 mg/dL — ABNORMAL HIGH (ref 70–99)

## 2011-08-12 LAB — CBC
MCH: 30.4 pg (ref 26.0–34.0)
MCHC: 34.5 g/dL (ref 30.0–36.0)
RDW: 12.8 % (ref 11.5–15.5)

## 2011-08-12 MED ORDER — LISINOPRIL 10 MG PO TABS: 20.0000 mg | ORAL_TABLET | Freq: Every day | ORAL | Status: DC

## 2011-08-12 MED ORDER — CIPROFLOXACIN HCL 250 MG PO TABS
250.0000 mg | ORAL_TABLET | Freq: Two times a day (BID) | ORAL | Status: DC
  Filled 2011-08-12: qty 1

## 2011-08-12 MED ORDER — METOPROLOL SUCCINATE ER 50 MG PO TB24: 50.0000 mg | ORAL_TABLET | Freq: Every day | ORAL | Status: DC

## 2011-08-12 MED ORDER — PANTOPRAZOLE SODIUM 40 MG IV SOLR: 40.0000 mg | INTRAVENOUS | Status: DC

## 2011-08-12 MED ORDER — PROMETHAZINE HCL 12.5 MG PO TABS: 12.5000 mg | ORAL_TABLET | Freq: Four times a day (QID) | ORAL | Status: DC | PRN

## 2011-08-12 MED ORDER — GABAPENTIN 600 MG PO TABS: 600.0000 mg | ORAL_TABLET | Freq: Three times a day (TID) | ORAL | Status: DC

## 2011-08-12 MED ORDER — INSULIN LISPRO 100 UNIT/ML ~~LOC~~ SOLN: 10.0000 [IU] | Freq: Three times a day (TID) | SUBCUTANEOUS | Status: DC

## 2011-08-12 MED ORDER — INSULIN GLARGINE 100 UNIT/ML ~~LOC~~ SOLN: 10.0000 [IU] | Freq: Every day | SUBCUTANEOUS | Status: DC

## 2011-08-12 MED ORDER — CIPROFLOXACIN HCL 250 MG PO TABS: 250.0000 mg | ORAL_TABLET | Freq: Two times a day (BID) | ORAL | Status: AC

## 2011-08-12 NOTE — Discharge Summary (Signed)
Patient seen and examined, feeling better and wanted to go home, stable for discharge to follow his PCP as arranged. I agree with the above discharge summary.

## 2011-08-12 NOTE — Progress Notes (Signed)
   CARE MANAGEMENT NOTE 08/12/2011  Patient:  [name], [name]   Account Number:  [id number]  Date Initiated:  08/10/2011  Documentation initiated by:  [facility]  Subjective/Objective Assessment:   ADMITTED W/N/V.     Action/Plan:   FROM HOME   Anticipated DC Date:  08/13/2011   Anticipated DC Plan:  HOME/SELF CARE  In-house referral  PCP / [facility]         Choice offered to / List presented to:             Status of service:  Completed, signed off Medicare Important Message given?   (If response is "NO", the following Medicare IM given date fields will be blank) Date Medicare IM given:   Date Additional Medicare IM given:    Discharge Disposition:  HOME/SELF CARE  Per UR Regulation:  Reviewed for med. necessity/level of care/duration of stay  Comments:  08/12/11 Kariana Wiles RN,BSN NCM [place] PCP LISTING. 08/10/11 Nia Nathaniel RN,BSN NCM [phone]
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
## 2011-09-13 ENCOUNTER — Emergency Department (HOSPITAL_COMMUNITY)
Admission: EM | Admit: 2011-09-13 | Discharge: 2011-09-13 | Disposition: A | Discharge status: Home Health | Payer: PRIVATE HEALTH INSURANCE | Attending: Emergency Medicine | Admitting: Emergency Medicine

## 2011-09-13 ENCOUNTER — Encounter (HOSPITAL_COMMUNITY): Payer: Self-pay | Admitting: Family Medicine

## 2011-09-13 DIAGNOSIS — F17200 Nicotine dependence, unspecified, uncomplicated: Secondary | ICD-10-CM | POA: Insufficient documentation

## 2011-09-13 DIAGNOSIS — Z794 Long term (current) use of insulin: Secondary | ICD-10-CM | POA: Insufficient documentation

## 2011-09-13 DIAGNOSIS — E1149 Type 2 diabetes mellitus with other diabetic neurological complication: Secondary | ICD-10-CM | POA: Insufficient documentation

## 2011-09-13 DIAGNOSIS — G609 Hereditary and idiopathic neuropathy, unspecified: Secondary | ICD-10-CM | POA: Insufficient documentation

## 2011-09-13 DIAGNOSIS — Z7982 Long term (current) use of aspirin: Secondary | ICD-10-CM | POA: Insufficient documentation

## 2011-09-13 DIAGNOSIS — J4489 Other specified chronic obstructive pulmonary disease: Secondary | ICD-10-CM | POA: Insufficient documentation

## 2011-09-13 DIAGNOSIS — I252 Old myocardial infarction: Secondary | ICD-10-CM | POA: Insufficient documentation

## 2011-09-13 DIAGNOSIS — I10 Essential (primary) hypertension: Secondary | ICD-10-CM | POA: Insufficient documentation

## 2011-09-13 DIAGNOSIS — K219 Gastro-esophageal reflux disease without esophagitis: Secondary | ICD-10-CM | POA: Insufficient documentation

## 2011-09-13 DIAGNOSIS — R112 Nausea with vomiting, unspecified: Secondary | ICD-10-CM | POA: Insufficient documentation

## 2011-09-13 DIAGNOSIS — K3184 Gastroparesis: Secondary | ICD-10-CM | POA: Insufficient documentation

## 2011-09-13 DIAGNOSIS — K861 Other chronic pancreatitis: Secondary | ICD-10-CM | POA: Insufficient documentation

## 2011-09-13 DIAGNOSIS — Z79899 Other long term (current) drug therapy: Secondary | ICD-10-CM | POA: Insufficient documentation

## 2011-09-13 DIAGNOSIS — I2510 Atherosclerotic heart disease of native coronary artery without angina pectoris: Secondary | ICD-10-CM | POA: Insufficient documentation

## 2011-09-13 DIAGNOSIS — J449 Chronic obstructive pulmonary disease, unspecified: Secondary | ICD-10-CM | POA: Insufficient documentation

## 2011-09-13 LAB — URINALYSIS, ROUTINE W REFLEX MICROSCOPIC
Glucose, UA: NEGATIVE mg/dL
Specific Gravity, Urine: 1.042 — ABNORMAL HIGH (ref 1.005–1.030)

## 2011-09-13 LAB — DIFFERENTIAL
Basophils Absolute: 0 10*3/uL (ref 0.0–0.1)
Eosinophils Relative: 1 % (ref 0–5)
Lymphocytes Relative: 52 % — ABNORMAL HIGH (ref 12–46)
Lymphs Abs: 3.4 10*3/uL (ref 0.7–4.0)
Neutrophils Relative %: 41 % — ABNORMAL LOW (ref 43–77)

## 2011-09-13 LAB — COMPREHENSIVE METABOLIC PANEL
ALT: 5 U/L (ref 0–35)
AST: 12 U/L (ref 0–37)
Alkaline Phosphatase: 120 U/L — ABNORMAL HIGH (ref 39–117)
CO2: 25 mEq/L (ref 19–32)
Calcium: 9.3 mg/dL (ref 8.4–10.5)
Glucose, Bld: 170 mg/dL — ABNORMAL HIGH (ref 70–99)
Potassium: 3 mEq/L — ABNORMAL LOW (ref 3.5–5.1)
Sodium: 137 mEq/L (ref 135–145)
Total Protein: 7.8 g/dL (ref 6.0–8.3)

## 2011-09-13 LAB — URINE MICROSCOPIC-ADD ON

## 2011-09-13 LAB — CBC
MCV: 86.1 fL (ref 78.0–100.0)
Platelets: 229 10*3/uL (ref 150–400)
RBC: 4.62 MIL/uL (ref 3.87–5.11)
RDW: 12.8 % (ref 11.5–15.5)
WBC: 6.6 10*3/uL (ref 4.0–10.5)

## 2011-09-13 MED ORDER — HYDROMORPHONE HCL PF 1 MG/ML IJ SOLN
1.0000 mg | Freq: Once | INTRAMUSCULAR | Status: AC
  Administered 2011-09-13: 1 mg via INTRAMUSCULAR

## 2011-09-13 MED ORDER — HYDROMORPHONE HCL PF 1 MG/ML IJ SOLN
1.0000 mg | Freq: Once | INTRAMUSCULAR | Status: DC
  Filled 2011-09-13: qty 1

## 2011-09-13 MED ORDER — OXYCODONE-ACETAMINOPHEN 5-325 MG PO TABS: 1.0000 | ORAL_TABLET | Freq: Four times a day (QID) | ORAL | Status: AC | PRN

## 2011-09-13 MED ORDER — SODIUM CHLORIDE 0.9 % IV BOLUS (SEPSIS)
500.0000 mL | Freq: Once | INTRAVENOUS | Status: AC
  Administered 2011-09-13: 500 mL via INTRAVENOUS

## 2011-09-13 MED ORDER — HYDROMORPHONE HCL PF 1 MG/ML IJ SOLN
1.0000 mg | Freq: Once | INTRAMUSCULAR | Status: AC
  Administered 2011-09-13: 1 mg via INTRAVENOUS

## 2011-09-13 MED ORDER — METOCLOPRAMIDE HCL 5 MG/ML IJ SOLN
10.0000 mg | Freq: Once | INTRAMUSCULAR | Status: DC
  Filled 2011-09-13: qty 2

## 2011-09-13 MED ORDER — PROMETHAZINE HCL 25 MG RE SUPP: 25.0000 mg | Freq: Four times a day (QID) | RECTAL | Status: DC | PRN

## 2011-09-13 MED ORDER — METOCLOPRAMIDE HCL 5 MG/ML IJ SOLN
10.0000 mg | Freq: Once | INTRAMUSCULAR | Status: AC
  Administered 2011-09-13: 10 mg via INTRAMUSCULAR

## 2011-09-13 MED ORDER — HYDROMORPHONE HCL PF 1 MG/ML IJ SOLN
0.5000 mg | Freq: Once | INTRAMUSCULAR | Status: AC
  Administered 2011-09-13: 0.5 mg via INTRAVENOUS
  Filled 2011-09-13: qty 1

## 2011-09-13 MED ORDER — LORAZEPAM 2 MG/ML IJ SOLN
0.5000 mg | Freq: Once | INTRAMUSCULAR | Status: AC
  Administered 2011-09-13: 0.5 mg via INTRAVENOUS
  Filled 2011-09-13: qty 1

## 2011-09-13 MED ORDER — POTASSIUM CHLORIDE CRYS ER 20 MEQ PO TBCR
40.0000 meq | EXTENDED_RELEASE_TABLET | Freq: Once | ORAL | Status: AC
  Administered 2011-09-13: 40 meq via ORAL
  Filled 2011-09-13: qty 2

## 2011-09-13 MED ORDER — SODIUM CHLORIDE 0.9 % IV BOLUS (SEPSIS)
1000.0000 mL | Freq: Once | INTRAVENOUS | Status: AC
  Administered 2011-09-13: 1000 mL via INTRAVENOUS

## 2011-09-13 NOTE — ED Notes (Signed)
Per EMS: Pt from home. C/o luq abdominal pain x2 days with hx of pancreatitis. Reports  N/V.

## 2011-09-13 NOTE — ED Notes (Signed)
Josh, PA at bedside. °

## 2011-09-13 NOTE — ED Notes (Signed)
[id number] Expected date:09/13/11<BR> Expected time: 7:45 AM<BR> Means of arrival:Ambulance<BR> Comments:<BR> 41yo. RUQ pain.hx of pancreatitis

## 2011-09-13 NOTE — ED Notes (Signed)
IV team RN unsuccessful x2 attempts.

## 2011-09-13 NOTE — ED Notes (Signed)
IV team RN at bedside.  

## 2011-09-13 NOTE — ED Notes (Signed)
Pt took sip of diet sprite, spit back out stating"i can't" and gagging. Will reattempt.

## 2011-09-13 NOTE — ED Provider Notes (Signed)
History     CSN: [id number]  Arrival date & time 09/13/11  [id number]   First MD Initiated Contact with Patient 09/13/11 0801      Chief Complaint  Patient presents with  . Abdominal Pain    (Consider location/radiation/quality/duration/timing/severity/associated sxs/prior treatment) HPI Comments: Patient with history of diabetes, diabetic gastroparesis, chronic pancreatitis, cholecystectomy -- presents with two-day history of nausea, vomiting, diarrhea, and abdominal pain. Her symptoms including the abdominal pain are similar to previous and are located across the upper abdomen, worse in the left upper quadrant and left side. The patient was hospitalized for the symptoms was recently in November and December of 2012. Patient has a history of PEG tube but does not currently have one. Patient states she has not been able to keep down solids and has only kept down a small amount of liquids for the past 2 days. Her stool is watery. She denies blood in vomit or stool. Patient states that vomit has been yellow to green with some brown flecks. Patient takes Phenergan at home but this is not helping. Denies alcohol or drug use.   Patient is a 41 y.o. female presenting with abdominal pain. The history is provided by the patient.  Abdominal Pain The primary symptoms of the illness include abdominal pain, nausea, vomiting and diarrhea. The primary symptoms of the illness do not include fever, shortness of breath, hematemesis, hematochezia, dysuria or vaginal bleeding. The current episode started 2 days ago. The onset of the illness was gradual. The problem has not changed since onset. The patient has had a change in bowel habit. Additional symptoms associated with the illness include chills. Symptoms associated with the illness do not include diaphoresis, heartburn, constipation or hematuria. Significant associated medical issues include diabetes.    Past Medical History  Diagnosis Date  . Hypertension   .  Diabetes mellitus   . Gastroparesis   . Asthma   . GERD (gastroesophageal reflux disease)   . Coronary artery disease   . h/o seizure   . Neuropathy   . Chronic pancreatitis   . Dyslipidemia   . MI (myocardial infarction)   . HTN (hypertension)   . Diabetes mellitus   . MI, old   . COPD (chronic obstructive pulmonary disease)     Past Surgical History  Procedure Date  . Abdominal hysterectomy   . Back surgery   . Cholecystectomy   . Peg tube x 4   . Cesarean section     History reviewed. No pertinent family history.  History  Substance Use Topics  . Smoking status: Current Everyday Smoker -- 0.2 packs/day for 2 years    Types: Cigarettes  . Smokeless tobacco: Never Used  . Alcohol Use: No     hx 1 beer daily -pt states she quit Dec. 2012    OB History    Grav Para Term Preterm Abortions TAB SAB Ect Mult Living                  Review of Systems  Constitutional: Positive for chills. Negative for fever and diaphoresis.  HENT: Negative for sore throat and rhinorrhea.   Eyes: Negative for redness.  Respiratory: Negative for cough and shortness of breath.   Cardiovascular: Negative for chest pain.  Gastrointestinal: Positive for nausea, vomiting, abdominal pain and diarrhea. Negative for heartburn, constipation, blood in stool, hematochezia and hematemesis.  Genitourinary: Negative for dysuria, hematuria and vaginal bleeding.  Musculoskeletal: Negative for myalgias.  Skin: Negative for rash.  Neurological:  Negative for headaches.    Allergies  Compazine; Darvocet; Nsaids; Penicillins; and Zofran  Home Medications   Current Outpatient Rx  Name Route Sig Dispense Refill  . ASPIRIN 81 MG PO TBEC Oral Take 1 tablet (81 mg total) by mouth daily. 30 tablet 0  . ATORVASTATIN CALCIUM 40 MG PO TABS Oral Take 1 tablet (40 mg total) by mouth daily. 30 tablet 0  . ESOMEPRAZOLE MAGNESIUM 40 MG PO PACK Oral Take 40 mg by mouth daily before breakfast. 30 each 0  .  FLUTICASONE-SALMETEROL 250-50 MCG/DOSE IN AEPB Inhalation Inhale 1 puff into the lungs 2 (two) times daily. 60 each 0  . GABAPENTIN 600 MG PO TABS Oral Take 1 tablet (600 mg total) by mouth 3 (three) times daily. 90 tablet 0  . INSULIN GLARGINE 100 UNIT/ML [place] SOLN Subcutaneous Inject 10 Units into the skin at bedtime. 3 mL 4    Please give pen  . INSULIN LISPRO (HUMAN) 100 UNIT/ML [place] SOLN Subcutaneous Inject 10-20 Units into the skin 3 (three) times daily before meals. Sliding scale 10 mL 2  . LISINOPRIL 10 MG PO TABS Oral Take 2 tablets (20 mg total) by mouth daily. 60 tablet 0  . METOPROLOL SUCCINATE ER 50 MG PO TB24 Oral Take 1 tablet (50 mg total) by mouth daily. 30 tablet 0  . PANTOPRAZOLE SODIUM 40 MG IV SOLR Intravenous Inject 40 mg into the vein daily. 1 each 0    BP 141/104  Pulse 107  Temp(Src) 99 F (37.2 C) (Oral)  Resp 20  SpO2 100%  Physical Exam  Nursing note and vitals reviewed. Constitutional: She is oriented to person, place, and time. She appears well-developed and well-nourished.  HENT:  Head: Normocephalic and atraumatic.  Eyes: Conjunctivae are normal. Right eye exhibits no discharge. Left eye exhibits no discharge.  Neck: Normal range of motion. Neck supple.  Cardiovascular: Regular rhythm and normal heart sounds.  Tachycardia present.   No murmur heard. Pulmonary/Chest: Effort normal and breath sounds normal.  Abdominal: Soft. Bowel sounds are normal. There is tenderness. There is no rigidity, no rebound, no guarding and no CVA tenderness.         Tenderness to light and deep palpation across entire after upper abdomen, worse in epigastrium and left upper quadrant.  Neurological: She is alert and oriented to person, place, and time.  Skin: Skin is warm and dry.  Psychiatric: She has a normal mood and affect.    ED Course  Procedures (including critical care time)  Labs Reviewed  DIFFERENTIAL - Abnormal; Notable for the following:    Neutrophils  Relative 41 (*)    Lymphocytes Relative 52 (*)    All other components within normal limits  COMPREHENSIVE METABOLIC PANEL - Abnormal; Notable for the following:    Potassium 3.0 (*)    Glucose, Bld 170 (*)    Creatinine, Ser 0.43 (*)    Albumin 2.7 (*)    Alkaline Phosphatase 120 (*)    Total Bilirubin 0.2 (*)    All other components within normal limits  URINALYSIS, ROUTINE W REFLEX MICROSCOPIC - Abnormal; Notable for the following:    Color, Urine AMBER (*) BIOCHEMICALS MAY BE AFFECTED BY COLOR   APPearance TURBID (*)    Specific Gravity, Urine 1.042 (*)    Hgb urine dipstick LARGE (*)    Bilirubin Urine SMALL (*)    Ketones, ur 15 (*)    Protein, ur >300 (*)    All other components within  normal limits  URINE MICROSCOPIC-ADD ON - Abnormal; Notable for the following:    Squamous Epithelial / LPF MANY (*)    Bacteria, UA MANY (*)    All other components within normal limits  CBC  LIPASE, BLOOD   No results found.   1. Nausea and vomiting   2. Diabetes mellitus     8:32 AM Patient seen and examined. Work-up initiated. Medications ordered. Previous admission notes reviewed.   Vital signs reviewed and are as follows: Filed Vitals:   09/13/11 0807  BP: 141/104  Pulse: 107  Temp: 99 F (37.2 C)  Resp: 20   10:09 AM IV team unable to get IV x 3. Pt is diabetic so foot stick not performed.   11:25 AM IV placed using [place] by Dr. Justin Mend.   1:49 PM Fluids given. Pt has had improvement in nausea, continues to have pain. Will challenge PO's.  2:59 PM Patient has fluids at bedside but has not been drinking yet. Pt to try PO's. Potassium given.   3:26 PM patient has tolerated oral fluids. Will give additional bolus and discharged home with pain and nausea medications. Patient verbalizes understanding and agrees with this plan.  3:27 PM The patient was urged to return to the Emergency Department immediately with worsening of current symptoms, worsening abdominal pain, persistent  vomiting, blood noted in stools, fever, or any other concerns. The patient verbalized understanding.   Patient counseled on use of narcotic pain medications. Counseled not to combine these medications with others containing tylenol. Urged not to drink alcohol, drive, or perform any other activities that requires focus while taking these medications. The patient verbalizes understanding and agrees with the plan.  MDM  Patient with symptoms consistent with her diabetic gastroparesis symptoms.  Vitals are stable, no fever.  No signs of dehydration, tolerating PO's.  Lungs are clear.  Labs reassuring.  No focal abdominal pain, no concern for appendicitis, cholecystitis, pancreatitis, ruptured viscus, UTI, kidney stone, or any other abdominal etiology.  Supportive therapy indicated with return if symptoms worsen.  Patient counseled.        [place], [place] 09/13/11 [phone]

## 2011-09-13 NOTE — Discharge Instructions (Signed)
Please read and follow all provided instructions.  Your diagnoses today include:  1. Nausea and vomiting   2. Diabetes mellitus     Tests performed today include:  Blood counts and electrolytes  Blood tests to check liver and kidney function  Blood tests to check pancreas function  Urine test to look for infection and pregnancy (in women)  Vital signs. See below for your results today.   Medications prescribed:   Vicodin (hydrocodone/acetaminophen) for pain -- do not combine with other medications containing Tylenol or acetaminophen  Phenergan (promethazine) -- for nausea and vomiting  Take any prescribed medications only as directed. You have been prescribed pain medicine that can make you drowsy. Do not drive or perform any activities that require you to be awake and alert if taking this medication.   Follow-up instructions: Please follow-up with your primary care provider in the next 3 days for further evaluation of your symptoms. If you do not have a primary care doctor -- see below for referral information.   Return instructions:  SEEK IMMEDIATE MEDICAL ATTENTION IF:  The pain does not go away or becomes severe   A temperature above 101 develops   Repeated vomiting occurs (multiple episodes)   The pain becomes localized to portions of the abdomen. The right side could possibly be appendicitis. In an adult, the left lower portion of the abdomen could be colitis or diverticulitis.   Blood is being passed in stools or vomit (bright red or black tarry stools)   You develop chest pain, difficulty breathing, dizziness or fainting, or become confused, poorly responsive, or inconsolable (young children)  If you have any other emergent concerns regarding your health  Additional Information: Abdominal (belly) pain can be caused by many things. Your caregiver performed an examination and possibly ordered blood/urine tests and imaging (CT scan, x-rays, ultrasound). Many cases  can be observed and treated at home after initial evaluation in the emergency department. Even though you are being discharged home, abdominal pain can be unpredictable. Therefore, you need a repeated exam if your pain does not resolve, returns, or worsens. Most patients with abdominal pain don't have to be admitted to the hospital or have surgery, but serious problems like appendicitis and gallbladder attacks can start out as nonspecific pain. Many abdominal conditions cannot be diagnosed in one visit, so follow-up evaluations are very important.  Your vital signs today were: BP 121/82  Pulse 93  Temp(Src) 98.6 F (37 C) (Oral)  Resp 18  SpO2 95% If your blood pressure (bp) was elevated above 135/85 this visit, please have this repeated by your doctor within one month. -------------- No Primary Care Doctor Call [facility]  [phone] Other agencies that provide inexpensive medical care    [facility]  [place]  [phone]    [facility]  [phone]    Planned Parenthood  [facility] [facility]  [phone] -------------- RESOURCE GUIDE:  Dental Problems  Patients with Medicaid: [facility] [phone] [address]  [place] Phone:  [phone]                                                      Phone:  [phone]  If unable to pay or uninsured, contact:  [facility] or [facility]. to become qualified for the adult dental clinic.  Chronic Pain Problems Contact [facility]  [phone] Patients need to be referred by their primary care doctor.  Insufficient Money for Medicine Contact United Way:  call "211" or [place] [phone].  [place]  [phone] [facility]  [place]    [phone] (emergency services [phone])  Substance Abuse Resources [facility] and Drug Services  [phone] [facility] [phone] [facility] [place] [phone] [place] Pester [phone] Residential & Outpatient Substance Abuse Program  [phone]  Abuse/Neglect [place] [phone] [place] [phone] (After Hours)  Emergency [place] [phone]  [place] [facility] [place] [phone] [place] [phone]  [facility] Dept. [address]      [place] Phone:  [phone]                                   Phone:  [phone]                 Phone:  [place] Phone:  [place] [phone] (After Hours)

## 2011-09-13 NOTE — ED Notes (Signed)
[name], [place] notified that pt is diabetic and cannot attempt footstick for IV start. States he will get back to me on how to obtain labs and IV.  Orders received to give meds IM at this time.

## 2011-09-13 NOTE — ED Notes (Signed)
Attempt x2 by Olivia Mackie, RN to start IV. Attempt x1 by Ria Comment, RN to start IV unsuccessfully. IV team paged.

## 2011-09-15 LAB — POCT PREGNANCY, URINE: Preg Test, Ur: NEGATIVE

## 2011-09-18 NOTE — ED Provider Notes (Signed)
Medical screening examination/treatment/procedure(s) were conducted as a shared visit with non-physician practitioner(s) and myself.  I personally evaluated the patient during the encounter  Ultrasound guided IV  Alcohol prep Appropriate vein visualized under ultrasound guidance  20G IV placed with good flush/draw One attempt Patient tolerated well without complaints   Blair Heys, MD 09/18/11 [phone]

## 2011-10-17 ENCOUNTER — Other Ambulatory Visit: Payer: Self-pay

## 2011-10-17 ENCOUNTER — Emergency Department (HOSPITAL_COMMUNITY): Payer: PRIVATE HEALTH INSURANCE

## 2011-10-17 ENCOUNTER — Encounter (HOSPITAL_COMMUNITY): Payer: Self-pay | Admitting: *Deleted

## 2011-10-17 ENCOUNTER — Emergency Department (HOSPITAL_COMMUNITY)
Admission: EM | Admit: 2011-10-17 | Discharge: 2011-10-17 | Disposition: A | Discharge status: Home / Self Care | Payer: PRIVATE HEALTH INSURANCE | Attending: Emergency Medicine | Admitting: Emergency Medicine

## 2011-10-17 DIAGNOSIS — J4489 Other specified chronic obstructive pulmonary disease: Secondary | ICD-10-CM | POA: Insufficient documentation

## 2011-10-17 DIAGNOSIS — G8929 Other chronic pain: Secondary | ICD-10-CM

## 2011-10-17 DIAGNOSIS — I10 Essential (primary) hypertension: Secondary | ICD-10-CM | POA: Insufficient documentation

## 2011-10-17 DIAGNOSIS — J449 Chronic obstructive pulmonary disease, unspecified: Secondary | ICD-10-CM | POA: Insufficient documentation

## 2011-10-17 DIAGNOSIS — R1012 Left upper quadrant pain: Secondary | ICD-10-CM | POA: Insufficient documentation

## 2011-10-17 DIAGNOSIS — I2510 Atherosclerotic heart disease of native coronary artery without angina pectoris: Secondary | ICD-10-CM | POA: Insufficient documentation

## 2011-10-17 DIAGNOSIS — R112 Nausea with vomiting, unspecified: Secondary | ICD-10-CM | POA: Insufficient documentation

## 2011-10-17 DIAGNOSIS — F17200 Nicotine dependence, unspecified, uncomplicated: Secondary | ICD-10-CM | POA: Insufficient documentation

## 2011-10-17 DIAGNOSIS — E1149 Type 2 diabetes mellitus with other diabetic neurological complication: Secondary | ICD-10-CM | POA: Insufficient documentation

## 2011-10-17 DIAGNOSIS — I252 Old myocardial infarction: Secondary | ICD-10-CM | POA: Insufficient documentation

## 2011-10-17 DIAGNOSIS — E1142 Type 2 diabetes mellitus with diabetic polyneuropathy: Secondary | ICD-10-CM | POA: Insufficient documentation

## 2011-10-17 DIAGNOSIS — K219 Gastro-esophageal reflux disease without esophagitis: Secondary | ICD-10-CM | POA: Insufficient documentation

## 2011-10-17 LAB — COMPREHENSIVE METABOLIC PANEL
AST: 19 U/L (ref 0–37)
Albumin: 2.8 g/dL — ABNORMAL LOW (ref 3.5–5.2)
Calcium: 9.3 mg/dL (ref 8.4–10.5)
Creatinine, Ser: 0.4 mg/dL — ABNORMAL LOW (ref 0.50–1.10)
GFR calc non Af Amer: >90 mL/min (ref >90)
Sodium: 134 mEq/L — ABNORMAL LOW (ref 135–145)
Total Protein: 7.5 g/dL (ref 6.0–8.3)

## 2011-10-17 LAB — URINE MICROSCOPIC-ADD ON

## 2011-10-17 LAB — DIFFERENTIAL
Eosinophils Absolute: 0.1 10*3/uL (ref 0.0–0.7)
Lymphs Abs: 3.1 10*3/uL (ref 0.7–4.0)
Monocytes Absolute: 0.5 10*3/uL (ref 0.1–1.0)
Monocytes Relative: 5 % (ref 3–12)
Neutrophils Relative %: 65 % (ref 43–77)

## 2011-10-17 LAB — URINALYSIS, ROUTINE W REFLEX MICROSCOPIC
Bilirubin Urine: NEGATIVE
Protein, ur: >300 mg/dL — AB
Urobilinogen, UA: 0.2 mg/dL (ref 0.0–1.0)

## 2011-10-17 LAB — CBC
HCT: 39.2 % (ref 36.0–46.0)
Hemoglobin: 13.5 g/dL (ref 12.0–15.0)
MCH: 30.4 pg (ref 26.0–34.0)
MCV: 88.3 fL (ref 78.0–100.0)
RBC: 4.44 MIL/uL (ref 3.87–5.11)

## 2011-10-17 MED ORDER — OXYCODONE-ACETAMINOPHEN 5-325 MG PO TABS: 2.0000 | ORAL_TABLET | ORAL | Status: AC | PRN

## 2011-10-17 MED ORDER — PROMETHAZINE HCL 25 MG/ML IJ SOLN
12.5000 mg | Freq: Once | INTRAMUSCULAR | Status: AC
  Administered 2011-10-17: 12.5 mg via INTRAVENOUS
  Filled 2011-10-17: qty 1

## 2011-10-17 MED ORDER — PROMETHAZINE HCL 25 MG/ML IJ SOLN
6.2500 mg | Freq: Once | INTRAMUSCULAR | Status: AC
  Administered 2011-10-17: 6.25 mg via INTRAVENOUS
  Filled 2011-10-17: qty 1

## 2011-10-17 MED ORDER — LIDOCAINE HCL 2 % IJ SOLN
INTRAMUSCULAR | Status: AC
  Administered 2011-10-17: 2 mg
  Filled 2011-10-17: qty 1

## 2011-10-17 MED ORDER — SODIUM CHLORIDE 0.9 % IV BOLUS (SEPSIS)
2000.0000 mL | Freq: Once | INTRAVENOUS | Status: AC
  Administered 2011-10-17 (×2): 1000 mL via INTRAVENOUS

## 2011-10-17 MED ORDER — HYDROMORPHONE HCL PF 2 MG/ML IJ SOLN
2.0000 mg | Freq: Once | INTRAMUSCULAR | Status: AC
  Administered 2011-10-17: 2 mg via INTRAVENOUS
  Filled 2011-10-17: qty 1

## 2011-10-17 MED ORDER — PROMETHAZINE HCL 25 MG RE SUPP: 25.0000 mg | Freq: Four times a day (QID) | RECTAL | Status: DC | PRN

## 2011-10-17 MED ORDER — HYDROMORPHONE HCL PF 1 MG/ML IJ SOLN
1.0000 mg | Freq: Once | INTRAMUSCULAR | Status: AC
  Administered 2011-10-17: 1 mg via INTRAVENOUS
  Filled 2011-10-17: qty 1

## 2011-10-17 NOTE — Discharge Instructions (Signed)
Abdominal Pain Many things can cause belly (abdominal) pain. Most times, the belly pain is not dangerous. The amount of belly pain does not tell how serious the problem may be. Many cases of belly pain can be watched and treated at home. HOME CARE   Do not take medicines that help you go poop (laxatives) unless told to by your doctor.   Only take medicine as told by your doctor.   Eat or drink as told by your doctor. Your doctor will tell you if you should be on a special diet.  GET HELP RIGHT AWAY IF:   The pain does not go away.   You have a fever.   You keep throwing up (vomiting).   The pain changes and is only in the right or left part of the belly.   You have bloody or tarry looking poop.  MAKE SURE YOU:   Understand these instructions.   Will watch your condition.   Will get help right away if you are not doing well or get worse.  Document Released: 01/04/2008 Document Revised: 07/07/2011 Document Reviewed: 08/03/2009 [facility] Patient Information 2012 [place], [place].  The blood sugar today was 288, which is mildly elevated. Call [facility] or return if you're unable hold down fluids without vomiting or fever condition worsens for any reason

## 2011-10-17 NOTE — ED Notes (Signed)
Pt. Is still unable to use the restroom at this time.

## 2011-10-17 NOTE — ED Notes (Signed)
Pt given ice water for PO challenge. Will recheck pt.

## 2011-10-17 NOTE — ED Provider Notes (Signed)
History     CSN: [id number]  Arrival date & time 10/17/11  [id number]   First MD Initiated Contact with Patient 10/17/11 [phone]      Chief Complaint  Patient presents with  . Abdominal Pain    LUQ    (Consider location/radiation/quality/duration/timing/severity/associated sxs/prior treatment) HPI Complains of abdominal pain gradual onset 2 days ago epigastric radiating to left upper quadrant typical of gastroparesis or pancreatitis he's had in the past. Pain is severe , constant ,sharp not made better or worse by anything. Treated with Reglan but she continues to vomit associated symptoms include vomiting 6 times, no hematemesis, chills and diarrhea 4 times no blood per rectum. No other complaint Past Medical History  Diagnosis Date  . Hypertension   . Diabetes mellitus   . Gastroparesis   . Asthma   . GERD (gastroesophageal reflux disease)   . Coronary artery disease   . h/o seizure   . Neuropathy   . Chronic pancreatitis   . Dyslipidemia   . MI (myocardial infarction)   . HTN (hypertension)   . Diabetes mellitus   . MI, old   . COPD (chronic obstructive pulmonary disease)     Past Surgical History  Procedure Date  . Abdominal hysterectomy   . Back surgery   . Cholecystectomy   . Peg tube x 4   . Cesarean section     No family history on file.  History  Substance Use Topics  . Smoking status: Current Everyday Smoker -- 0.2 packs/day for 2 years    Types: Cigarettes  . Smokeless tobacco: Never Used  . Alcohol Use: No     hx 1 beer daily -pt states she quit Dec. 2012    OB History    Grav Para Term Preterm Abortions TAB SAB Ect Mult Living                  Review of Systems  Constitutional: Negative.   HENT: Negative.   Respiratory: Negative.   Cardiovascular: Negative.   Gastrointestinal: Positive for nausea, vomiting, abdominal pain and diarrhea.  Musculoskeletal: Negative.   Skin: Negative.   Neurological: Negative.   Hematological: Negative.     Psychiatric/Behavioral: Negative.   All other systems reviewed and are negative.    Allergies  Compazine; Darvocet; Nsaids; Penicillins; and Zofran  Home Medications   Current Outpatient Rx  Name Route Sig Dispense Refill  . ASPIRIN 81 MG PO TBEC Oral Take 1 tablet (81 mg total) by mouth daily. 30 tablet 0  . ATORVASTATIN CALCIUM 40 MG PO TABS Oral Take 1 tablet (40 mg total) by mouth daily. 30 tablet 0  . ESOMEPRAZOLE MAGNESIUM 40 MG PO PACK Oral Take 40 mg by mouth daily before breakfast. 30 each 0  . FLUTICASONE-SALMETEROL 250-50 MCG/DOSE IN AEPB Inhalation Inhale 1 puff into the lungs 2 (two) times daily. 60 each 0  . GABAPENTIN 600 MG PO TABS Oral Take 1 tablet (600 mg total) by mouth 3 (three) times daily. 90 tablet 0  . INSULIN GLARGINE 100 UNIT/ML [place] SOLN Subcutaneous Inject 10 Units into the skin at bedtime. 3 mL 4    Please give pen  . INSULIN LISPRO (HUMAN) 100 UNIT/ML [place] SOLN Subcutaneous Inject 10-20 Units into the skin 3 (three) times daily before meals. Sliding scale 10 mL 2  . LISINOPRIL 10 MG PO TABS Oral Take 2 tablets (20 mg total) by mouth daily. 60 tablet 0  . METOPROLOL SUCCINATE ER 50 MG PO TB24 Oral  Take 1 tablet (50 mg total) by mouth daily. 30 tablet 0    BP 144/98  Pulse 95  Temp(Src) 98 F (36.7 C) (Oral)  Resp 13  SpO2 100%  Physical Exam  Nursing note and vitals reviewed. Constitutional: She appears well-developed and well-nourished. She appears distressed.       Appears uncomfortable  HENT:  Head: Normocephalic and atraumatic.       mucus membranes dry  Eyes: Conjunctivae are normal. Pupils are equal, round, and reactive to light.  Neck: Neck supple. No tracheal deviation present. No thyromegaly present.  Cardiovascular: Normal rate and regular rhythm.   No murmur heard. Pulmonary/Chest: Effort normal and breath sounds normal.  Abdominal: Soft. Bowel sounds are normal. She exhibits no distension and no mass. There is tenderness. There is no  rebound and no guarding.       Hypoactive bowel sounds; tender at epigastrium and left upper quadrant,  Genitourinary:       Left flank tenderness  Musculoskeletal: Normal range of motion. She exhibits no edema and no tenderness.  Neurological: She is alert. Coordination normal.  Skin: Skin is warm and dry. No rash noted.  Psychiatric: She has a normal mood and affect.   Nursing unable establish peripheral IV access. I established a right-sided external jugular peripheral IV after the skin was prepped with alcohol numbed locally with lidocaine a 20-gauge Angiocath was inserted with good blood draw and good flush   ED Course  Procedures (including critical care time)  Labs Reviewed  COMPREHENSIVE METABOLIC PANEL  CBC  DIFFERENTIAL  LIPASE, BLOOD  URINALYSIS, ROUTINE W REFLEX MICROSCOPIC   No results found.  Date: 10/17/2011  Rate: 90  Rhythm: normal sinus rhythm  QRS Axis: normal  Intervals: normal  ST/T Wave abnormalities: normal  Conduction Disutrbances:none  Narrative Interpretation:   Old EKG Reviewed: unchanged Inferior wall MI age indeterminate, unchanged from 06/10/2011 Results for orders placed during the hospital encounter of 10/17/11  COMPREHENSIVE METABOLIC PANEL      Component Value Range   Sodium 134 (*) 135 - 145 (mEq/L)   Potassium 4.0  3.5 - 5.1 (mEq/L)   Chloride 98  96 - 112 (mEq/L)   CO2 25  19 - 32 (mEq/L)   Glucose, Bld 288 (*) 70 - 99 (mg/dL)   BUN 12  6 - 23 (mg/dL)   Creatinine, Ser 0.40 (*) 0.50 - 1.10 (mg/dL)   Calcium 9.3  8.4 - 10.5 (mg/dL)   Total Protein 7.5  6.0 - 8.3 (g/dL)   Albumin 2.8 (*) 3.5 - 5.2 (g/dL)   AST 19  0 - 37 (U/L)   ALT 17  0 - 35 (U/L)   Alkaline Phosphatase 122 (*) 39 - 117 (U/L)   Total Bilirubin 0.1 (*) 0.3 - 1.2 (mg/dL)   GFR calc non Af Amer >90  >90 (mL/min)   GFR calc Af Amer >90  >90 (mL/min)  CBC      Component Value Range   WBC 10.7 (*) 4.0 - 10.5 (K/uL)   RBC 4.44  3.87 - 5.11 (MIL/uL)   Hemoglobin  13.5  12.0 - 15.0 (g/dL)   HCT 39.2  36.0 - 46.0 (%)   MCV 88.3  78.0 - 100.0 (fL)   MCH 30.4  26.0 - 34.0 (pg)   MCHC 34.4  30.0 - 36.0 (g/dL)   RDW 13.4  11.5 - 15.5 (%)   Platelets 178  150 - 400 (K/uL)  DIFFERENTIAL      Component  Value Range   Neutrophils Relative 65  43 - 77 (%)   Neutro Abs 7.0  1.7 - 7.7 (K/uL)   Lymphocytes Relative 29  12 - 46 (%)   Lymphs Abs 3.1  0.7 - 4.0 (K/uL)   Monocytes Relative 5  3 - 12 (%)   Monocytes Absolute 0.5  0.1 - 1.0 (K/uL)   Eosinophils Relative 1  0 - 5 (%)   Eosinophils Absolute 0.1  0.0 - 0.7 (K/uL)   Basophils Relative 0  0 - 1 (%)   Basophils Absolute 0.0  0.0 - 0.1 (K/uL)  LIPASE, BLOOD      Component Value Range   Lipase 25  11 - 59 (U/L)  URINALYSIS, ROUTINE W REFLEX MICROSCOPIC      Component Value Range   Color, Urine YELLOW  YELLOW    APPearance CLOUDY (*) CLEAR    Specific Gravity, Urine 1.014  1.005 - 1.030    pH 5.5  5.0 - 8.0    Glucose, UA NEGATIVE  NEGATIVE (mg/dL)   Hgb urine dipstick MODERATE (*) NEGATIVE    Bilirubin Urine NEGATIVE  NEGATIVE    Ketones, ur NEGATIVE  NEGATIVE (mg/dL)   Protein, ur >300 (*) NEGATIVE (mg/dL)   Urobilinogen, UA 0.2  0.0 - 1.0 (mg/dL)   Nitrite NEGATIVE  NEGATIVE    Leukocytes, UA NEGATIVE  NEGATIVE   POCT I-STAT TROPONIN I      Component Value Range   Troponin i, poc 0.00  0.00 - 0.08 (ng/mL)   Comment 3           URINE MICROSCOPIC-ADD ON      Component Value Range   Squamous Epithelial / LPF MANY (*) RARE    WBC, UA 21-50  <3 (WBC/hpf)   RBC / HPF 3-6  <3 (RBC/hpf)   Bacteria, UA MANY (*) RARE    Dg Abd Acute W/chest  10/17/2011  *RADIOLOGY REPORT*  Clinical Data: Abdominal pain, nausea and diarrhea.  Asthma, diabetes and chronic pancreatitis.  ACUTE ABDOMEN SERIES (ABDOMEN 2 VIEW & CHEST 1 VIEW)  Comparison: 08/09/2011  Findings: Bowel gas pattern does not show any evidence of ileus, obstruction or free air.  There are an anastomotic sutures in the left upper quadrant.   There are clips in the right upper quadrant related previous cholecystectomy.  Previous lower lumbar discectomy and fusion.  No acute bony finding.  One-view chest shows normal heart and mediastinal shadows.  There is chronic bronchial thickening.  No infiltrate, mass, effusion or collapse.  No free air.  IMPRESSION: No acute radiographic finding.  Gas pattern unremarkable.  Original Report Authenticated By: Jules Schick, M.D.     No diagnosis found.  262mpAfter treatment with intravenous fluids and several doses of intravenous narcotics feels improved ready to home. Alert appropriate Glasgow Coma Score 15. Patient able to drink without difficulty She feels well to go home  MDM  Plan prescription Phenergan suppositories(patient request),percocet, Followup Dr.GARBA Diagnosis #1 nausea and vomiting #2 chronic abdominal pain #3 hyperglycemia          Orlie Dakin, MD 10/17/11 1515

## 2011-10-17 NOTE — ED Notes (Signed)
Pt reports nausea and vomiting started two days ago followed by abdominal pain in left upper quadrant that radiates to back.  Pt reports two episodes of nausea and vomiting this AM. Pt unable to keep anything down.  Pt reports she has had this pain before and states this pain feels similar to her gastroparesis and pancreatitis pain. Pt denies taking anything for pain and tried to take reglan for nausea, but threw that up.  Pt has been unable to take regular medications.  Pt reports pain increases when taking a deep breath.

## 2011-10-17 NOTE — ED Notes (Signed)
Per EMS. Pt is from home. Pt reports abdominal pain starting two days ago. Pt reports she has a history of pancreatitis and gastroparesis. Pt reports she was here a month ago for the same pain.  Pt also reports N/V. Pt denies diarrhea. Pt reports abdominal pain radiates to her back.

## 2011-10-17 NOTE — ED Notes (Signed)
Pt also reporting 4 episodes of diarrhea yesterday. None today. Pt denies fever, but reports chills

## 2011-10-17 NOTE — ED Notes (Signed)
[id number] Expected date:10/17/11<BR> Expected time: 8:01 AM<BR> Means of arrival:Ambulance<BR> Comments:<BR> RUQ pain

## 2011-10-17 NOTE — ED Notes (Signed)
Pt. Is unable to use the restroom at this time. 

## 2011-10-20 ENCOUNTER — Encounter (HOSPITAL_COMMUNITY): Payer: Self-pay | Admitting: *Deleted

## 2011-10-20 ENCOUNTER — Emergency Department (HOSPITAL_COMMUNITY)
Admission: EM | Admit: 2011-10-20 | Discharge: 2011-10-20 | Discharge status: Against Medical Advice | Payer: PRIVATE HEALTH INSURANCE | Attending: Emergency Medicine | Admitting: Emergency Medicine

## 2011-10-20 DIAGNOSIS — R112 Nausea with vomiting, unspecified: Secondary | ICD-10-CM | POA: Insufficient documentation

## 2011-10-20 DIAGNOSIS — R109 Unspecified abdominal pain: Secondary | ICD-10-CM | POA: Insufficient documentation

## 2011-10-20 NOTE — ED Notes (Signed)
Per EMS- pt in c/o severe abd pain x4 days, also nausea, episode of vomiting blood yesterday, history of pancreatitis

## 2011-11-03 ENCOUNTER — Encounter (HOSPITAL_COMMUNITY): Payer: Self-pay | Admitting: Emergency Medicine

## 2011-11-03 ENCOUNTER — Emergency Department (HOSPITAL_COMMUNITY)
Admission: EM | Admit: 2011-11-03 | Discharge: 2011-11-03 | Disposition: A | Discharge status: Home / Self Care | Payer: PRIVATE HEALTH INSURANCE | Attending: Emergency Medicine | Admitting: Emergency Medicine

## 2011-11-03 DIAGNOSIS — Z79899 Other long term (current) drug therapy: Secondary | ICD-10-CM | POA: Insufficient documentation

## 2011-11-03 DIAGNOSIS — R109 Unspecified abdominal pain: Secondary | ICD-10-CM | POA: Insufficient documentation

## 2011-11-03 DIAGNOSIS — J449 Chronic obstructive pulmonary disease, unspecified: Secondary | ICD-10-CM | POA: Insufficient documentation

## 2011-11-03 DIAGNOSIS — R112 Nausea with vomiting, unspecified: Secondary | ICD-10-CM | POA: Insufficient documentation

## 2011-11-03 DIAGNOSIS — J4489 Other specified chronic obstructive pulmonary disease: Secondary | ICD-10-CM | POA: Insufficient documentation

## 2011-11-03 DIAGNOSIS — I252 Old myocardial infarction: Secondary | ICD-10-CM | POA: Insufficient documentation

## 2011-11-03 DIAGNOSIS — E119 Type 2 diabetes mellitus without complications: Secondary | ICD-10-CM | POA: Insufficient documentation

## 2011-11-03 DIAGNOSIS — I10 Essential (primary) hypertension: Secondary | ICD-10-CM | POA: Insufficient documentation

## 2011-11-03 DIAGNOSIS — E785 Hyperlipidemia, unspecified: Secondary | ICD-10-CM | POA: Insufficient documentation

## 2011-11-03 DIAGNOSIS — K219 Gastro-esophageal reflux disease without esophagitis: Secondary | ICD-10-CM | POA: Insufficient documentation

## 2011-11-03 LAB — CBC
HCT: 43.7 % (ref 36.0–46.0)
Hemoglobin: 15.5 g/dL — ABNORMAL HIGH (ref 12.0–15.0)
MCH: 30.8 pg (ref 26.0–34.0)
MCHC: 35.5 g/dL (ref 30.0–36.0)
MCV: 86.7 fL (ref 78.0–100.0)
Platelets: 190 10*3/uL (ref 150–400)
RBC: 5.04 MIL/uL (ref 3.87–5.11)
RDW: 13.3 % (ref 11.5–15.5)
WBC: 9.4 10*3/uL (ref 4.0–10.5)

## 2011-11-03 LAB — COMPREHENSIVE METABOLIC PANEL
ALT: 11 U/L (ref 0–35)
AST: 15 U/L (ref 0–37)
Albumin: 3.3 g/dL — ABNORMAL LOW (ref 3.5–5.2)
Alkaline Phosphatase: 123 U/L — ABNORMAL HIGH (ref 39–117)
BUN: 18 mg/dL (ref 6–23)
CO2: 21 mEq/L (ref 19–32)
Calcium: 9.8 mg/dL (ref 8.4–10.5)
Chloride: 96 mEq/L (ref 96–112)
Creatinine, Ser: 0.47 mg/dL — ABNORMAL LOW (ref 0.50–1.10)
GFR calc Af Amer: >90 mL/min (ref >90)
GFR calc non Af Amer: >90 mL/min (ref >90)
Glucose, Bld: 401 mg/dL — ABNORMAL HIGH (ref 70–99)
Potassium: 3.8 mEq/L (ref 3.5–5.1)
Sodium: 135 mEq/L (ref 135–145)
Total Bilirubin: 0.2 mg/dL — ABNORMAL LOW (ref 0.3–1.2)
Total Protein: 7.9 g/dL (ref 6.0–8.3)

## 2011-11-03 LAB — GLUCOSE, CAPILLARY: Glucose-Capillary: 267 mg/dL — ABNORMAL HIGH (ref 70–99)

## 2011-11-03 LAB — LIPASE, BLOOD: Lipase: 24 U/L (ref 11–59)

## 2011-11-03 MED ORDER — INSULIN ASPART 100 UNIT/ML ~~LOC~~ SOLN
10.0000 [IU] | Freq: Once | SUBCUTANEOUS | Status: AC
  Administered 2011-11-03: 10 [IU] via INTRAVENOUS
  Filled 2011-11-03: qty 1

## 2011-11-03 MED ORDER — DROPERIDOL 2.5 MG/ML IJ SOLN
2.5000 mg | Freq: Once | INTRAMUSCULAR | Status: AC
  Administered 2011-11-03: 2.5 mg via INTRAVENOUS
  Filled 2011-11-03: qty 1

## 2011-11-03 MED ORDER — METOCLOPRAMIDE HCL 5 MG/ML IJ SOLN
10.0000 mg | Freq: Once | INTRAMUSCULAR | Status: AC
  Administered 2011-11-03: 10 mg via INTRAVENOUS
  Filled 2011-11-03: qty 2

## 2011-11-03 MED ORDER — SODIUM CHLORIDE 0.9 % IV BOLUS (SEPSIS)
1000.0000 mL | Freq: Once | INTRAVENOUS | Status: AC
  Administered 2011-11-03: 1000 mL via INTRAVENOUS

## 2011-11-03 MED ORDER — HYDROMORPHONE HCL PF 1 MG/ML IJ SOLN
1.0000 mg | Freq: Once | INTRAMUSCULAR | Status: AC
  Administered 2011-11-03: 1 mg via INTRAVENOUS

## 2011-11-03 MED ORDER — HYDROMORPHONE HCL PF 1 MG/ML IJ SOLN
1.0000 mg | Freq: Once | INTRAMUSCULAR | Status: DC
  Filled 2011-11-03: qty 1

## 2011-11-03 MED ORDER — PROMETHAZINE HCL 25 MG PO TABS: 12.5000 mg | ORAL_TABLET | Freq: Three times a day (TID) | ORAL | Status: DC | PRN

## 2011-11-03 MED ORDER — HYDROMORPHONE HCL PF 1 MG/ML IJ SOLN
1.0000 mg | Freq: Once | INTRAMUSCULAR | Status: AC
  Administered 2011-11-03: 1 mg via INTRAVENOUS
  Filled 2011-11-03: qty 1

## 2011-11-03 NOTE — ED Notes (Signed)
EMS was called for seizure. Family told EMS that patient had seizure prior to their arrival. EMS reported that patient was alert and oriented upon arrival at the scene, and patient was c/o n/v and abdominal pain.

## 2011-11-03 NOTE — Discharge Instructions (Signed)
Abdominal Pain Abdominal pain can be caused by many things. Your caregiver decides the seriousness of your pain by an examination and possibly blood tests and X-rays. Many cases can be observed and treated at home. Most abdominal pain is not caused by a disease and will probably improve without treatment. However, in many cases, more time must pass before a clear cause of the pain can be found. Before that point, it may not be known if you need more testing, or if hospitalization or surgery is needed. HOME CARE INSTRUCTIONS   Do not take laxatives unless directed by your caregiver.   Take pain medicine only as directed by your caregiver.   Only take over-the-counter or prescription medicines for pain, discomfort, or fever as directed by your caregiver.   Try a clear liquid diet (broth, tea, or water) for as long as directed by your caregiver. Slowly move to a bland diet as tolerated.  SEEK IMMEDIATE MEDICAL CARE IF:   The pain does not go away.   You have a fever.   You keep throwing up (vomiting).   The pain is felt only in portions of the abdomen. Pain in the right side could possibly be appendicitis. In an adult, pain in the left lower portion of the abdomen could be colitis or diverticulitis.   You pass bloody or black tarry stools.  MAKE SURE YOU:   Understand these instructions.   Will watch your condition.   Will get help right away if you are not doing well or get worse.  Document Released: 04/27/2005 Document Revised: 07/07/2011 Document Reviewed: 03/05/2008 [facility] Patient Information 2012 [place].Nausea and Vomiting Nausea means you feel sick to your stomach. Throwing up (vomiting) is a reflex where stomach contents come out of your mouth. HOME CARE   Take medicine as told by your doctor.   Do not force yourself to eat. However, you do need to drink fluids.   If you feel like eating, eat a normal diet as told by your doctor.   Eat rice, wheat, potatoes,  bread, lean meats, yogurt, fruits, and vegetables.   Avoid high-fat foods.   Drink enough fluids to keep your pee (urine) clear or pale yellow.   Ask your doctor how to replace body fluid losses (rehydrate). Signs of body fluid loss (dehydration) include:   Feeling very thirsty.   Dry lips and mouth.   Feeling dizzy.   Dark pee.   Peeing less than normal.   Feeling confused.   Fast breathing or heart rate.  GET HELP RIGHT AWAY IF:   You have blood in your throw up.   You have black or bloody poop (stool).   You have a bad headache or stiff neck.   You feel confused.   You have bad belly (abdominal) pain.   You have chest pain or trouble breathing.   You do not pee at least once every 8 hours.   You have cold, clammy skin.   You keep throwing up after 24 to 48 hours.   You have a fever.  MAKE SURE YOU:   Understand these instructions.   Will watch your condition.   Will get help right away if you are not doing well or get worse.  Document Released: 01/04/2008 Document Revised: 07/07/2011 Document Reviewed: 12/17/2010 [facility] Patient Information 2012 [place], [place].  RESOURCE GUIDE  Dental Problems  Patients with Medicaid: [facility]  [place] Phone:  [phone]                                                  Phone:  [phone]  If unable to pay or uninsured, contact:  [facility] or [facility] to become qualified for the adult dental clinic.  Chronic Pain Problems Contact [facility]  [phone] Patients need to be referred by their primary care doctor.  Insufficient Money for Medicine Contact United Way:  call "211" or [place] [phone].  No Primary Care Doctor Call [facility]  [phone] Other agencies that provide inexpensive medical care    [place]   [phone]    [facility]  [place]  [phone]    [facility]  [phone]    Planned Parenthood  [place]  [place]  [phone] [place]   [phone] (emergency services [phone])  Substance Abuse Resources [facility] Drug Services  [phone] [facility] [phone] [facility] [place] [phone] [place] Pester [phone] Residential & Outpatient Substance Abuse Program  [phone]  Abuse/Neglect [place] [phone] [place] [phone] (After Hours)  Emergency [place] [phone]  [place] at the [place] [phone] [place] [phone]  MRSA Hotline #:   (818)381-1760    [facility] Dept. [address]      [place] Phone:  [phone]                                   Phone:  [phone]                 Phone:  [place] Phone:  [place] [phone] (After Hours)

## 2011-11-03 NOTE — ED Notes (Signed)
Attempted to place pt in gown.  Pt actively vomiting and will change into a gown once patient is more settled

## 2011-11-11 NOTE — ED Provider Notes (Signed)
History    40yF with abdominal pain and n/v. Onset about 4d ago. Upper abdomen. Does not radiate. No appreciable exacerbating or relieving factors. No fever or chills. N/v. Some small streaks of blood in in yesterday. No coffee grounds. Possible seizure pta. Pt not sure. Pt aox3 on ems arrival. No fever ro chills. No urinary complaints. Reports hx of pancreatitis.  CSN: [id number]  Arrival date & time 11/03/11  0106   First MD Initiated Contact with Patient 11/03/11 0116      Chief Complaint  Patient presents with  . Emesis  . Abdominal Pain    (Consider location/radiation/quality/duration/timing/severity/associated sxs/prior treatment) HPI  Past Medical History  Diagnosis Date  . Hypertension   . Diabetes mellitus   . Gastroparesis   . Asthma   . GERD (gastroesophageal reflux disease)   . Coronary artery disease   . h/o seizure   . Neuropathy   . Chronic pancreatitis   . Dyslipidemia   . MI (myocardial infarction)   . HTN (hypertension)   . Diabetes mellitus   . MI, old   . COPD (chronic obstructive pulmonary disease)     Past Surgical History  Procedure Date  . Abdominal hysterectomy   . Back surgery   . Cholecystectomy   . Peg tube x 4   . Cesarean section     History reviewed. No pertinent family history.  History  Substance Use Topics  . Smoking status: Current Everyday Smoker -- 0.2 packs/day for 2 years    Types: Cigarettes  . Smokeless tobacco: Never Used  . Alcohol Use: No     hx 1 beer daily -pt states she quit Dec. 2012    OB History    Grav Para Term Preterm Abortions TAB SAB Ect Mult Living                  Review of Systems   Review of symptoms negative unless otherwise noted in HPI.   Allergies  Compazine; Darvocet; Nsaids; Penicillins; and Zofran  Home Medications   Current Outpatient Rx  Name Route Sig Dispense Refill  . ASPIRIN 81 MG PO TBEC Oral Take 1 tablet (81 mg total) by mouth daily. 30 tablet 0  . ATORVASTATIN  CALCIUM 40 MG PO TABS Oral Take 1 tablet (40 mg total) by mouth daily. 30 tablet 0  . ESOMEPRAZOLE MAGNESIUM 40 MG PO PACK Oral Take 40 mg by mouth daily before breakfast. 30 each 0  . FLUTICASONE-SALMETEROL 250-50 MCG/DOSE IN AEPB Inhalation Inhale 1 puff into the lungs 2 (two) times daily. 60 each 0  . GABAPENTIN 600 MG PO TABS Oral Take 1 tablet (600 mg total) by mouth 3 (three) times daily. 90 tablet 0  . INSULIN GLARGINE 100 UNIT/ML [place] SOLN Subcutaneous Inject 10 Units into the skin at bedtime. 3 mL 4    Please give pen  . INSULIN LISPRO (HUMAN) 100 UNIT/ML [place] SOLN Subcutaneous Inject 10-20 Units into the skin 3 (three) times daily before meals. Sliding scale 10 mL 2  . LISINOPRIL 10 MG PO TABS Oral Take 2 tablets (20 mg total) by mouth daily. 60 tablet 0  . METOPROLOL SUCCINATE ER 50 MG PO TB24 Oral Take 1 tablet (50 mg total) by mouth daily. 30 tablet 0  . PROMETHAZINE HCL 12.5 MG PO TABS Oral Take 1 tablet (12.5 mg total) by mouth every 6 (six) hours as needed for nausea. 16 tablet 0  . PROMETHAZINE HCL 25 MG RE SUPP Rectal Place 1  suppository (25 mg total) rectally every 6 (six) hours as needed for nausea. 15 each 0  . PROMETHAZINE HCL 25 MG RE SUPP Rectal Place 1 suppository (25 mg total) rectally every 6 (six) hours as needed for nausea. 12 each 0  . PROMETHAZINE HCL 25 MG RE SUPP Rectal Place 1 suppository (25 mg total) rectally every 6 (six) hours as needed for nausea. 12 each 0  . PROMETHAZINE HCL 25 MG PO TABS Oral Take 1 tablet (25 mg total) by mouth every 6 (six) hours as needed for nausea. 15 tablet 0  . PROMETHAZINE HCL 25 MG PO TABS Oral Take 0.5 tablets (12.5 mg total) by mouth every 8 (eight) hours as needed for nausea. 20 tablet 0  . PROMETHAZINE HCL 50 MG RE SUPP Rectal Place 0.5 suppositories (25 mg total) rectally every 6 (six) hours as needed for nausea. 15 each 0    BP 167/99  Pulse 117  Temp(Src) 98.8 F (37.1 C) (Oral)  Resp 20  SpO2 97%  Physical Exam  Nursing  note and vitals reviewed. Constitutional: She appears well-developed and well-nourished.       Laying in bed. Mildly uncomfortable.  HENT:  Head: Normocephalic and atraumatic.  Eyes: Conjunctivae are normal. Pupils are equal, round, and reactive to light. Right eye exhibits no discharge. Left eye exhibits no discharge.  Neck: Neck supple.  Cardiovascular: Regular rhythm and normal heart sounds.  Exam reveals no gallop and no friction rub.   No murmur heard.      Tachy with reg rhythm  Pulmonary/Chest: Effort normal and breath sounds normal. No respiratory distress.  Abdominal: Soft. She exhibits no distension. There is tenderness.       Upper abdominal tenderness, worse in epigastrium. No gaurding or rebound. No diatension. No mass.  Genitourinary:       No cva tenderness  Musculoskeletal: She exhibits no edema and no tenderness.  Neurological: She is alert.  Skin: Skin is warm and dry. She is not diaphoretic.  Psychiatric: She has a normal mood and affect. Her behavior is normal. Thought content normal.    ED Course  Procedures (including critical care time)  Labs Reviewed  COMPREHENSIVE METABOLIC PANEL - Abnormal; Notable for the following:    Glucose, Bld 401 (*)    Creatinine, Ser 0.47 (*)    Albumin 3.3 (*)    Alkaline Phosphatase 123 (*)    Total Bilirubin 0.2 (*)    All other components within normal limits  CBC - Abnormal; Notable for the following:    Hemoglobin 15.5 (*)    All other components within normal limits  GLUCOSE, CAPILLARY - Abnormal; Notable for the following:    Glucose-Capillary 267 (*)    All other components within normal limits  LIPASE, BLOOD  LAB REPORT - SCANNED   No results found.   1. Abdominal pain   2. Nausea and vomiting       MDM  40yF with abdominal pain and n/v. Suspect viral illness. Hyperglycemic but no acidosis. Tachycardia likley 2/2 dehydration. Hemoconcentrated. Doubt surgical abdomen. Feels much better with meds. Return  precautions dicussed. outpt fu.        Virgel Manifold, MD 11/11/11 1444

## 2011-11-14 ENCOUNTER — Encounter (HOSPITAL_COMMUNITY): Payer: Self-pay | Admitting: *Deleted

## 2011-11-14 ENCOUNTER — Emergency Department (HOSPITAL_COMMUNITY): Payer: PRIVATE HEALTH INSURANCE

## 2011-11-14 ENCOUNTER — Emergency Department (HOSPITAL_COMMUNITY)
Admission: EM | Admit: 2011-11-14 | Discharge: 2011-11-14 | Disposition: A | Discharge status: Home / Self Care | Payer: PRIVATE HEALTH INSURANCE | Attending: Emergency Medicine | Admitting: Emergency Medicine

## 2011-11-14 DIAGNOSIS — K219 Gastro-esophageal reflux disease without esophagitis: Secondary | ICD-10-CM | POA: Insufficient documentation

## 2011-11-14 DIAGNOSIS — E785 Hyperlipidemia, unspecified: Secondary | ICD-10-CM | POA: Insufficient documentation

## 2011-11-14 DIAGNOSIS — Z7982 Long term (current) use of aspirin: Secondary | ICD-10-CM | POA: Insufficient documentation

## 2011-11-14 DIAGNOSIS — R000 Tachycardia, unspecified: Secondary | ICD-10-CM | POA: Insufficient documentation

## 2011-11-14 DIAGNOSIS — Z79899 Other long term (current) drug therapy: Secondary | ICD-10-CM | POA: Insufficient documentation

## 2011-11-14 DIAGNOSIS — I2510 Atherosclerotic heart disease of native coronary artery without angina pectoris: Secondary | ICD-10-CM | POA: Insufficient documentation

## 2011-11-14 DIAGNOSIS — I252 Old myocardial infarction: Secondary | ICD-10-CM | POA: Insufficient documentation

## 2011-11-14 DIAGNOSIS — J45909 Unspecified asthma, uncomplicated: Secondary | ICD-10-CM | POA: Insufficient documentation

## 2011-11-14 DIAGNOSIS — E119 Type 2 diabetes mellitus without complications: Secondary | ICD-10-CM | POA: Insufficient documentation

## 2011-11-14 DIAGNOSIS — R112 Nausea with vomiting, unspecified: Secondary | ICD-10-CM | POA: Insufficient documentation

## 2011-11-14 DIAGNOSIS — Z794 Long term (current) use of insulin: Secondary | ICD-10-CM | POA: Insufficient documentation

## 2011-11-14 DIAGNOSIS — R1013 Epigastric pain: Secondary | ICD-10-CM | POA: Insufficient documentation

## 2011-11-14 DIAGNOSIS — I10 Essential (primary) hypertension: Secondary | ICD-10-CM | POA: Insufficient documentation

## 2011-11-14 LAB — URINE MICROSCOPIC-ADD ON

## 2011-11-14 LAB — URINALYSIS, ROUTINE W REFLEX MICROSCOPIC
Ketones, ur: 40 mg/dL — AB
Leukocytes, UA: NEGATIVE
Nitrite: NEGATIVE
Protein, ur: >300 mg/dL — AB
pH: 6 (ref 5.0–8.0)

## 2011-11-14 LAB — LIPASE, BLOOD: Lipase: 29 U/L (ref 11–59)

## 2011-11-14 LAB — COMPREHENSIVE METABOLIC PANEL
Albumin: 2.9 g/dL — ABNORMAL LOW (ref 3.5–5.2)
BUN: 12 mg/dL (ref 6–23)
Calcium: 9.7 mg/dL (ref 8.4–10.5)
Chloride: 100 mEq/L (ref 96–112)
Creatinine, Ser: 0.44 mg/dL — ABNORMAL LOW (ref 0.50–1.10)
GFR calc non Af Amer: >90 mL/min (ref >90)
Total Bilirubin: 0.2 mg/dL — ABNORMAL LOW (ref 0.3–1.2)

## 2011-11-14 LAB — CBC
HCT: 42.2 % (ref 36.0–46.0)
MCH: 31.5 pg (ref 26.0–34.0)
MCV: 88.7 fL (ref 78.0–100.0)
Platelets: 173 10*3/uL (ref 150–400)
RDW: 13.5 % (ref 11.5–15.5)
WBC: 10.7 10*3/uL — ABNORMAL HIGH (ref 4.0–10.5)

## 2011-11-14 MED ORDER — METOCLOPRAMIDE HCL 10 MG PO TABS: 10.0000 mg | ORAL_TABLET | Freq: Four times a day (QID) | ORAL | Status: DC

## 2011-11-14 MED ORDER — METOCLOPRAMIDE HCL 5 MG/ML IJ SOLN
10.0000 mg | Freq: Once | INTRAMUSCULAR | Status: AC
  Administered 2011-11-14: 10 mg via INTRAVENOUS
  Filled 2011-11-14: qty 2

## 2011-11-14 MED ORDER — SODIUM CHLORIDE 0.9 % IV SOLN
1000.0000 mL | Freq: Once | INTRAVENOUS | Status: AC
  Administered 2011-11-14: 1000 mL via INTRAVENOUS

## 2011-11-14 MED ORDER — HYDROMORPHONE HCL PF 1 MG/ML IJ SOLN
1.0000 mg | Freq: Once | INTRAMUSCULAR | Status: AC
  Administered 2011-11-14: 1 mg via INTRAVENOUS
  Filled 2011-11-14: qty 1

## 2011-11-14 MED ORDER — SODIUM CHLORIDE 0.9 % IV SOLN
Freq: Once | INTRAVENOUS | Status: AC
  Administered 2011-11-14: 10:00:00 via INTRAVENOUS

## 2011-11-14 MED ORDER — PANTOPRAZOLE SODIUM 40 MG IV SOLR
40.0000 mg | Freq: Once | INTRAVENOUS | Status: AC
  Administered 2011-11-14: 40 mg via INTRAVENOUS
  Filled 2011-11-14: qty 40

## 2011-11-14 MED ORDER — MORPHINE SULFATE 4 MG/ML IJ SOLN
4.0000 mg | Freq: Once | INTRAMUSCULAR | Status: AC
  Administered 2011-11-14: 4 mg via INTRAVENOUS
  Filled 2011-11-14: qty 1

## 2011-11-14 MED ORDER — DEXTROSE 50 % IV SOLN
1.0000 | Freq: Once | INTRAVENOUS | Status: AC
Start: 2011-11-14 — End: 2011-11-14
  Administered 2011-11-14: 50 mL via INTRAVENOUS

## 2011-11-14 MED ORDER — HYDROMORPHONE HCL 2 MG PO TABS: 2.0000 mg | ORAL_TABLET | Freq: Four times a day (QID) | ORAL | Status: DC | PRN

## 2011-11-14 MED ORDER — PANTOPRAZOLE SODIUM 20 MG PO TBEC: 20.0000 mg | DELAYED_RELEASE_TABLET | Freq: Every day | ORAL | Status: DC

## 2011-11-14 NOTE — ED Notes (Signed)
Pt vomiting stating "please help me please"

## 2011-11-14 NOTE — ED Provider Notes (Signed)
History     CSN: [id number]  Arrival date & time 11/14/11  [id number]   First MD Initiated Contact with Patient 11/14/11 1126      Chief Complaint  Patient presents with  . Abdominal Pain    n/v    (Consider location/radiation/quality/duration/timing/severity/associated sxs/prior treatment) Patient is a 41 y.o. female presenting with abdominal pain. The history is provided by the patient and medical records.  Abdominal Pain The primary symptoms of the illness include abdominal pain, nausea and vomiting. The primary symptoms of the illness do not include fever, shortness of breath or diarrhea.  Symptoms associated with the illness do not include chills.   the patient is a 41 year old, female, with a history of insulin-dependent diabetes, and gastroparesis, who presents to emergency department complaining of epigastric pain with nausea and vomiting.  Since yesterday.  She denies a cough, or shortness of breath.  She denies fevers, and chills.  She has not had diarrhea.  She has not been on antibiotics recently.  She was seen in the emergency department.  A few days ago by Dr. Lajuan Lines for similar symptoms treated for hyperglycemia and released.  She has a history of prior abdominal surgery  Past Medical History  Diagnosis Date  . Hypertension   . Diabetes mellitus   . Gastroparesis   . Asthma   . GERD (gastroesophageal reflux disease)   . Coronary artery disease   . h/o seizure   . Neuropathy   . Chronic pancreatitis   . Dyslipidemia   . MI (myocardial infarction)   . HTN (hypertension)   . Diabetes mellitus   . MI, old   . COPD (chronic obstructive pulmonary disease)     Past Surgical History  Procedure Date  . Abdominal hysterectomy   . Back surgery   . Cholecystectomy   . Peg tube x 4   . Cesarean section     No family history on file.  History  Substance Use Topics  . Smoking status: Current Everyday Smoker -- 0.2 packs/day for 2 years    Types: Cigarettes  . Smokeless  tobacco: Never Used  . Alcohol Use: No     hx 1 beer daily -pt states she quit Dec. 2012    OB History    Grav Para Term Preterm Abortions TAB SAB Ect Mult Living                  Review of Systems  Constitutional: Negative for fever and chills.  Respiratory: Negative for cough and shortness of breath.   Cardiovascular: Negative for chest pain.  Gastrointestinal: Positive for nausea, vomiting and abdominal pain. Negative for diarrhea.  Neurological: Negative for headaches.  Psychiatric/Behavioral: Negative for confusion.  All other systems reviewed and are negative.    Allergies  Compazine; Darvocet; Nsaids; Penicillins; and Zofran  Home Medications   Current Outpatient Rx  Name Route Sig Dispense Refill  . ATORVASTATIN CALCIUM 40 MG PO TABS Oral Take 1 tablet (40 mg total) by mouth daily. 30 tablet 0  . OXYCODONE-ACETAMINOPHEN 5-325 MG PO TABS Oral Take 1 tablet by mouth every 4 (four) hours as needed.    . ASPIRIN 81 MG PO TBEC Oral Take 1 tablet (81 mg total) by mouth daily. 30 tablet 0  . ESOMEPRAZOLE MAGNESIUM 40 MG PO PACK Oral Take 40 mg by mouth daily before breakfast. 30 each 0  . FLUTICASONE-SALMETEROL 250-50 MCG/DOSE IN AEPB Inhalation Inhale 1 puff into the lungs 2 (two) times daily. 60 each  0  . GABAPENTIN 600 MG PO TABS Oral Take 1 tablet (600 mg total) by mouth 3 (three) times daily. 90 tablet 0  . INSULIN GLARGINE 100 UNIT/ML [place] SOLN Subcutaneous Inject 10 Units into the skin at bedtime. 3 mL 4    Please give pen  . INSULIN LISPRO (HUMAN) 100 UNIT/ML [place] SOLN Subcutaneous Inject 10-20 Units into the skin 3 (three) times daily before meals. Sliding scale 10 mL 2  . LISINOPRIL 10 MG PO TABS Oral Take 2 tablets (20 mg total) by mouth daily. 60 tablet 0  . METOPROLOL SUCCINATE ER 50 MG PO TB24 Oral Take 1 tablet (50 mg total) by mouth daily. 30 tablet 0  . PROMETHAZINE HCL 12.5 MG PO TABS Oral Take 1 tablet (12.5 mg total) by mouth every 6 (six) hours as needed for  nausea. 16 tablet 0  . PROMETHAZINE HCL 25 MG RE SUPP Rectal Place 1 suppository (25 mg total) rectally every 6 (six) hours as needed for nausea. 15 each 0  . PROMETHAZINE HCL 25 MG RE SUPP Rectal Place 1 suppository (25 mg total) rectally every 6 (six) hours as needed for nausea. 12 each 0  . PROMETHAZINE HCL 25 MG RE SUPP Rectal Place 1 suppository (25 mg total) rectally every 6 (six) hours as needed for nausea. 12 each 0  . PROMETHAZINE HCL 25 MG PO TABS Oral Take 1 tablet (25 mg total) by mouth every 6 (six) hours as needed for nausea. 15 tablet 0  . PROMETHAZINE HCL 25 MG PO TABS Oral Take 0.5 tablets (12.5 mg total) by mouth every 8 (eight) hours as needed for nausea. 20 tablet 0  . PROMETHAZINE HCL 50 MG RE SUPP Rectal Place 0.5 suppositories (25 mg total) rectally every 6 (six) hours as needed for nausea. 15 each 0    BP 178/103  Pulse 111  Temp(Src) 98.8 F (37.1 C) (Oral)  Resp 18  SpO2 100%  Physical Exam  Vitals reviewed. Constitutional: She is oriented to person, place, and time. She appears well-developed and well-nourished. She appears distressed.       Very uncomfortable appearing, leaning over the bed with an emesis bag half full  HENT:  Head: Normocephalic and atraumatic.  Eyes: Conjunctivae and EOM are normal. No scleral icterus.  Neck: Normal range of motion. Neck supple.  Cardiovascular:  No murmur heard.      Tachycardia  Pulmonary/Chest: Effort normal. No respiratory distress. She has no wheezes.  Abdominal: Soft. There is tenderness.       Epigastric tenderness, no distention.  No peritoneal signs  Musculoskeletal: Normal range of motion.  Neurological: She is alert and oriented to person, place, and time.  Skin: Skin is warm and dry.  Psychiatric: She has a normal mood and affect. Thought content normal.    ED Course  Procedures (including critical care time) 41 year old, female, with history of diabetes, gastroparesis, and prior abdominal surgery,  presents to emergency department with abdominal pain, and nausea and vomiting.  On concerned that she has recurrent or gastroparesis.  We will perform laboratory testing, and x-ray, to look for a bowel obstruction, as well, since she's had prior bowel surgery, and establish an IV and give fluids, antiemetics and analgesics   ECG Time of EKG 0 959 Heart rate 105 Sinus tachycardia with left axis deviation Right atrial hypertrophy.  No conduction defects normal ST and T waves Impression sinus tachycardia with right atrial enlargement, and left axis deviation  2:36 PM Still in  pain  3:24 PM Pt says she feels "better."  She says she feels as if she can go home.   MDM  Abdominal pain, with nausea and vomiting sxs improved in ed. No acute abdomen       Barbara Cower, MD 11/14/11 1525

## 2011-11-14 NOTE — ED Notes (Signed)
Per EMS, pt from home, reports abd pain with n/v since last night.  Pt was seen at [facility] for same x 3 days.

## 2011-11-14 NOTE — ED Notes (Signed)
Pt dry heaving while in the room, while [place] EMT at bedside to obtain VS, pt started to c/o chest pain.  EKG ordered.

## 2011-11-14 NOTE — ED Notes (Signed)
IV placement attempted multiple times without success.

## 2011-11-14 NOTE — ED Notes (Signed)
Pt aware of the need for a urine sample however, unable to void at this time.

## 2011-11-14 NOTE — ED Notes (Signed)
RN attempted IV placement x 2, another RN to attempt.

## 2011-11-14 NOTE — Discharge Instructions (Signed)
Use protonix to reduce acid and control pain.  Use dilaudid for pain as well. Use reglan for nausea and vomiting.   Follow up with your doctor for reevaluation. Return for worse or uncontrolled pain.

## 2011-11-24 ENCOUNTER — Emergency Department (HOSPITAL_COMMUNITY)
Admission: EM | Admit: 2011-11-24 | Discharge: 2011-11-25 | Disposition: A | Discharge status: Home / Self Care | Payer: PRIVATE HEALTH INSURANCE | Attending: Emergency Medicine | Admitting: Emergency Medicine

## 2011-11-24 ENCOUNTER — Encounter (HOSPITAL_COMMUNITY): Payer: Self-pay | Admitting: *Deleted

## 2011-11-24 ENCOUNTER — Emergency Department (HOSPITAL_COMMUNITY): Payer: PRIVATE HEALTH INSURANCE

## 2011-11-24 DIAGNOSIS — R109 Unspecified abdominal pain: Secondary | ICD-10-CM | POA: Insufficient documentation

## 2011-11-24 DIAGNOSIS — K861 Other chronic pancreatitis: Secondary | ICD-10-CM | POA: Insufficient documentation

## 2011-11-24 DIAGNOSIS — K219 Gastro-esophageal reflux disease without esophagitis: Secondary | ICD-10-CM | POA: Insufficient documentation

## 2011-11-24 DIAGNOSIS — Z79899 Other long term (current) drug therapy: Secondary | ICD-10-CM | POA: Insufficient documentation

## 2011-11-24 DIAGNOSIS — J449 Chronic obstructive pulmonary disease, unspecified: Secondary | ICD-10-CM | POA: Insufficient documentation

## 2011-11-24 DIAGNOSIS — R739 Hyperglycemia, unspecified: Secondary | ICD-10-CM

## 2011-11-24 DIAGNOSIS — E1169 Type 2 diabetes mellitus with other specified complication: Secondary | ICD-10-CM | POA: Insufficient documentation

## 2011-11-24 DIAGNOSIS — I10 Essential (primary) hypertension: Secondary | ICD-10-CM | POA: Insufficient documentation

## 2011-11-24 DIAGNOSIS — Z794 Long term (current) use of insulin: Secondary | ICD-10-CM | POA: Insufficient documentation

## 2011-11-24 DIAGNOSIS — I252 Old myocardial infarction: Secondary | ICD-10-CM | POA: Insufficient documentation

## 2011-11-24 DIAGNOSIS — J4489 Other specified chronic obstructive pulmonary disease: Secondary | ICD-10-CM | POA: Insufficient documentation

## 2011-11-24 LAB — URINALYSIS, ROUTINE W REFLEX MICROSCOPIC
Ketones, ur: 15 mg/dL — AB
Nitrite: NEGATIVE
Protein, ur: >300 mg/dL — AB
pH: 6 (ref 5.0–8.0)

## 2011-11-24 LAB — CBC
HCT: 41.7 % (ref 36.0–46.0)
MCH: 30 pg (ref 26.0–34.0)
MCHC: 34.3 g/dL (ref 30.0–36.0)
MCV: 87.6 fL (ref 78.0–100.0)
RDW: 13.3 % (ref 11.5–15.5)

## 2011-11-24 LAB — BASIC METABOLIC PANEL
BUN: 15 mg/dL (ref 6–23)
CO2: 25 mEq/L (ref 19–32)
Chloride: 98 mEq/L (ref 96–112)
Creatinine, Ser: 0.61 mg/dL (ref 0.50–1.10)

## 2011-11-24 LAB — URINE MICROSCOPIC-ADD ON

## 2011-11-24 LAB — GLUCOSE, CAPILLARY: Glucose-Capillary: 270 mg/dL — ABNORMAL HIGH (ref 70–99)

## 2011-11-24 LAB — DIFFERENTIAL
Basophils Absolute: 0 10*3/uL (ref 0.0–0.1)
Basophils Relative: 0 % (ref 0–1)
Eosinophils Absolute: 0 10*3/uL (ref 0.0–0.7)
Eosinophils Relative: 0 % (ref 0–5)
Lymphocytes Relative: 26 % (ref 12–46)
Monocytes Absolute: 0.2 10*3/uL (ref 0.1–1.0)

## 2011-11-24 MED ORDER — ONDANSETRON HCL 4 MG/2ML IJ SOLN: 4.0000 mg | Freq: Once | INTRAMUSCULAR | Status: DC

## 2011-11-24 MED ORDER — SODIUM CHLORIDE 0.9 % IV BOLUS (SEPSIS)
500.0000 mL | Freq: Once | INTRAVENOUS | Status: AC
  Administered 2011-11-24: 500 mL via INTRAVENOUS

## 2011-11-24 MED ORDER — METOCLOPRAMIDE HCL 10 MG PO TABS: 10.0000 mg | ORAL_TABLET | Freq: Four times a day (QID) | ORAL | Status: DC

## 2011-11-24 MED ORDER — HYDROMORPHONE HCL PF 1 MG/ML IJ SOLN: 1.0000 mg | Freq: Once | INTRAMUSCULAR | Status: DC

## 2011-11-24 MED ORDER — HYDROMORPHONE HCL PF 1 MG/ML IJ SOLN
1.0000 mg | Freq: Once | INTRAMUSCULAR | Status: AC
  Administered 2011-11-24: 1 mg via INTRAVENOUS
  Filled 2011-11-24: qty 1

## 2011-11-24 MED ORDER — HYDROMORPHONE HCL 2 MG PO TABS: 2.0000 mg | ORAL_TABLET | ORAL | Status: DC | PRN

## 2011-11-24 MED ORDER — PROMETHAZINE HCL 25 MG/ML IJ SOLN
25.0000 mg | Freq: Once | INTRAMUSCULAR | Status: AC
  Administered 2011-11-24: 25 mg via INTRAVENOUS
  Filled 2011-11-24: qty 1

## 2011-11-24 NOTE — ED Notes (Signed)
Returned from xray

## 2011-11-24 NOTE — ED Provider Notes (Signed)
History     CSN: [id number]  Arrival date & time 11/24/11  1642   First MD Initiated Contact with Patient 11/24/11 1716      Chief Complaint  Patient presents with  . Nausea    per EMS pt in from home reports n/v since 0800 today.   . Emesis    (Consider location/radiation/quality/duration/timing/severity/associated sxs/prior treatment) Patient is a 41 y.o. female presenting with vomiting. The history is provided by the patient and medical records.  Emesis  Pertinent negatives include no abdominal pain, no chills, no cough, no diarrhea, no fever and no headaches.   the patient is a 41 year old, female, with insulin-dependent diabetes, gastroparesis, and chronic pancreatitis.  She presents to emergency department complaining of epigastric pain and nausea, vomiting, and elevated blood sugars today.  She also has polydipsia.  She denies diarrhea or urinary tract symptoms.  She denies cough, or shortness of breath.  She does not drink alcohol.  Past Medical History  Diagnosis Date  . Hypertension   . Diabetes mellitus   . Gastroparesis   . Asthma   . GERD (gastroesophageal reflux disease)   . Coronary artery disease   . h/o seizure   . Neuropathy   . Chronic pancreatitis   . Dyslipidemia   . MI (myocardial infarction)   . HTN (hypertension)   . Diabetes mellitus   . MI, old   . COPD (chronic obstructive pulmonary disease)     Past Surgical History  Procedure Date  . Abdominal hysterectomy   . Back surgery   . Cholecystectomy   . Peg tube x 4   . Cesarean section     History reviewed. No pertinent family history.  History  Substance Use Topics  . Smoking status: Current Everyday Smoker -- 0.2 packs/day for 2 years    Types: Cigarettes  . Smokeless tobacco: Never Used  . Alcohol Use: No     hx 1 beer daily -pt states she quit Dec. 2012    OB History    Grav Para Term Preterm Abortions TAB SAB Ect Mult Living                  Review of Systems    Constitutional: Negative for fever and chills.  HENT: Negative for voice change.   Eyes: Negative for visual disturbance.  Respiratory: Negative for cough and shortness of breath.   Cardiovascular: Negative for chest pain.  Gastrointestinal: Positive for nausea and vomiting. Negative for abdominal pain and diarrhea.  Genitourinary: Positive for frequency. Negative for dysuria.  Musculoskeletal: Negative for back pain.  Neurological: Negative for headaches.  Psychiatric/Behavioral: Negative for confusion.  All other systems reviewed and are negative.    Allergies  Compazine; Darvocet; Nsaids; Penicillins; and Zofran  Home Medications   Current Outpatient Rx  Name Route Sig Dispense Refill  . ASPIRIN 81 MG PO TBEC Oral Take 1 tablet (81 mg total) by mouth daily. 30 tablet 0  . ATORVASTATIN CALCIUM 40 MG PO TABS Oral Take 1 tablet (40 mg total) by mouth daily. 30 tablet 0  . ESOMEPRAZOLE MAGNESIUM 40 MG PO CPDR Oral Take 40 mg by mouth daily before breakfast.    . FLUTICASONE-SALMETEROL 250-50 MCG/DOSE IN AEPB Inhalation Inhale 1 puff into the lungs 2 (two) times daily. 60 each 0  . GABAPENTIN 600 MG PO TABS Oral Take 1 tablet (600 mg total) by mouth 3 (three) times daily. 90 tablet 0  . HYDROMORPHONE HCL 2 MG PO TABS Oral Take  1 tablet (2 mg total) by mouth every 6 (six) hours as needed for pain. 15 tablet 0  . INSULIN GLARGINE 100 UNIT/ML [place] SOLN Subcutaneous Inject 10 Units into the skin at bedtime. 3 mL 4    Please give pen  . INSULIN LISPRO (HUMAN) 100 UNIT/ML [place] SOLN Subcutaneous Inject 10-20 Units into the skin 3 (three) times daily before meals. Sliding scale 10 mL 2  . LISINOPRIL 10 MG PO TABS Oral Take 2 tablets (20 mg total) by mouth daily. 60 tablet 0  . METOCLOPRAMIDE HCL 10 MG PO TABS Oral Take 1 tablet (10 mg total) by mouth 4 (four) times daily. 120 tablet 0  . METOCLOPRAMIDE HCL 10 MG PO TABS Oral Take 1 tablet (10 mg total) by mouth every 6 (six) hours. 30 tablet 0   . METOPROLOL SUCCINATE ER 50 MG PO TB24 Oral Take 1 tablet (50 mg total) by mouth daily. 30 tablet 0  . OXYCODONE-ACETAMINOPHEN 5-325 MG PO TABS Oral Take 1 tablet by mouth every 4 (four) hours as needed.    [place] PANTOPRAZOLE SODIUM 20 MG PO TBEC Oral Take 1 tablet (20 mg total) by mouth daily. 30 tablet 0  . PROMETHAZINE HCL 12.5 MG PO TABS Oral Take 1 tablet (12.5 mg total) by mouth every 6 (six) hours as needed for nausea. 16 tablet 0  . PROMETHAZINE HCL 25 MG RE SUPP Rectal Place 1 suppository (25 mg total) rectally every 6 (six) hours as needed for nausea. 15 each 0  . PROMETHAZINE HCL 25 MG RE SUPP Rectal Place 1 suppository (25 mg total) rectally every 6 (six) hours as needed for nausea. 12 each 0  . PROMETHAZINE HCL 25 MG RE SUPP Rectal Place 1 suppository (25 mg total) rectally every 6 (six) hours as needed for nausea. 12 each 0  . PROMETHAZINE HCL 25 MG PO TABS Oral Take 1 tablet (25 mg total) by mouth every 6 (six) hours as needed for nausea. 15 tablet 0  . PROMETHAZINE HCL 25 MG PO TABS Oral Take 0.5 tablets (12.5 mg total) by mouth every 8 (eight) hours as needed for nausea. 20 tablet 0  . PROMETHAZINE HCL 50 MG RE SUPP Rectal Place 0.5 suppositories (25 mg total) rectally every 6 (six) hours as needed for nausea. 15 each 0    BP 195/110  Pulse 108  Temp 98.9 F (37.2 C)  Resp 16  SpO2 98%  Physical Exam  Vitals reviewed. Constitutional: She is oriented to person, place, and time. She appears well-developed and well-nourished. No distress.       Uncomfortable appearing  HENT:  Head: Normocephalic and atraumatic.  Eyes: Conjunctivae are normal.  Neck: Normal range of motion. Neck supple.  Cardiovascular: Regular rhythm.   No murmur heard.      Tachycardic  Pulmonary/Chest: Effort normal and breath sounds normal. No respiratory distress.  Abdominal: Soft. She exhibits no distension. There is tenderness.       Epigastric tenderness, decreased bowel sounds  Musculoskeletal:  Normal range of motion.  Neurological: She is alert and oriented to person, place, and time.  Skin: Skin is warm and dry.  Psychiatric: She has a normal mood and affect. Thought content normal.    ED Course  Procedures (including critical care time) 41 year old, female, with history of insulin-dependent diabetes, gastroparesis, and chronic abdominal pain.  Presents emergency department with epigastric pain, nausea, and vomiting.  Today.  She has no other symptoms.  We'll establish IV treat her symptoms and  perform an acute abdominal series, since he's had a history of abdominal surgery.  In the past, as well as laboratory testing, and urine testing.   Labs Reviewed  BASIC METABOLIC PANEL  CBC  DIFFERENTIAL  URINALYSIS, ROUTINE W REFLEX MICROSCOPIC  LIPASE, BLOOD   No results found.   No diagnosis found.    ECG ECG time 1810 Normal sinus rhythm with heart rate 88 Normal axis Normal.  Interval Normal.  ST and T-wave         MDM  Abdominal pain, with nausea and vomiting.  Today        Barbara Cower, MD 11/24/11 [phone]

## 2011-11-24 NOTE — ED Notes (Signed)
Patient ambulated to the bathroom at this time. Gait steady. Patient given specimen cup for urine collection.

## 2011-11-24 NOTE — ED Notes (Signed)
Patient transported to X-ray 

## 2011-11-24 NOTE — ED Notes (Signed)
[id number] Expected date:11/24/11<BR> Expected time: 4:36 PM<BR> Means of arrival:Ambulance<BR> Comments:<BR> M40. 40 f. N/V, sick. IV with fluid bolus. 10 mins

## 2011-11-24 NOTE — Discharge Instructions (Signed)
Your x-ray does not show any signs of obstruction.  Your blood tests do not show any evidence of significant illness.  Other than mild elevation of your blood sugar.  Use Dilaudid for pain and Reglan for nausea and vomiting.  Followup with your Dr. for reevaluation.  Return for worse or uncontrolled symptoms

## 2011-11-24 NOTE — ED Notes (Signed)
Pt. states she does not have to use restroom. Advised needed urine specimen

## 2011-11-30 ENCOUNTER — Inpatient Hospital Stay (HOSPITAL_COMMUNITY)
Admission: EM | Admit: 2011-11-30 | Discharge: 2011-12-02 | DRG: 638 | Disposition: A | Discharge status: Home / Self Care | Payer: PRIVATE HEALTH INSURANCE | Attending: Internal Medicine | Admitting: Internal Medicine

## 2011-11-30 ENCOUNTER — Encounter (HOSPITAL_COMMUNITY): Payer: Self-pay | Admitting: Family Medicine

## 2011-11-30 ENCOUNTER — Emergency Department (HOSPITAL_COMMUNITY): Payer: PRIVATE HEALTH INSURANCE

## 2011-11-30 DIAGNOSIS — T4275XA Adverse effect of unspecified antiepileptic and sedative-hypnotic drugs, initial encounter: Secondary | ICD-10-CM | POA: Diagnosis present

## 2011-11-30 DIAGNOSIS — R112 Nausea with vomiting, unspecified: Secondary | ICD-10-CM

## 2011-11-30 DIAGNOSIS — K3184 Gastroparesis: Secondary | ICD-10-CM | POA: Diagnosis present

## 2011-11-30 DIAGNOSIS — Z794 Long term (current) use of insulin: Secondary | ICD-10-CM

## 2011-11-30 DIAGNOSIS — E876 Hypokalemia: Secondary | ICD-10-CM | POA: Diagnosis not present

## 2011-11-30 DIAGNOSIS — K219 Gastro-esophageal reflux disease without esophagitis: Secondary | ICD-10-CM

## 2011-11-30 DIAGNOSIS — K5900 Constipation, unspecified: Secondary | ICD-10-CM | POA: Diagnosis present

## 2011-11-30 DIAGNOSIS — R1084 Generalized abdominal pain: Secondary | ICD-10-CM

## 2011-11-30 DIAGNOSIS — J4489 Other specified chronic obstructive pulmonary disease: Secondary | ICD-10-CM | POA: Diagnosis present

## 2011-11-30 DIAGNOSIS — R109 Unspecified abdominal pain: Secondary | ICD-10-CM | POA: Diagnosis present

## 2011-11-30 DIAGNOSIS — E785 Hyperlipidemia, unspecified: Secondary | ICD-10-CM | POA: Diagnosis present

## 2011-11-30 DIAGNOSIS — G8929 Other chronic pain: Secondary | ICD-10-CM | POA: Diagnosis present

## 2011-11-30 DIAGNOSIS — I252 Old myocardial infarction: Secondary | ICD-10-CM

## 2011-11-30 DIAGNOSIS — E1165 Type 2 diabetes mellitus with hyperglycemia: Secondary | ICD-10-CM

## 2011-11-30 DIAGNOSIS — E1310 Other specified diabetes mellitus with ketoacidosis without coma: Principal | ICD-10-CM | POA: Diagnosis present

## 2011-11-30 DIAGNOSIS — F17200 Nicotine dependence, unspecified, uncomplicated: Secondary | ICD-10-CM | POA: Diagnosis present

## 2011-11-30 DIAGNOSIS — R1115 Cyclical vomiting syndrome unrelated to migraine: Secondary | ICD-10-CM | POA: Diagnosis present

## 2011-11-30 DIAGNOSIS — Z79899 Other long term (current) drug therapy: Secondary | ICD-10-CM

## 2011-11-30 DIAGNOSIS — I10 Essential (primary) hypertension: Secondary | ICD-10-CM | POA: Diagnosis present

## 2011-11-30 DIAGNOSIS — Z7982 Long term (current) use of aspirin: Secondary | ICD-10-CM

## 2011-11-30 DIAGNOSIS — K861 Other chronic pancreatitis: Secondary | ICD-10-CM | POA: Diagnosis present

## 2011-11-30 DIAGNOSIS — I2510 Atherosclerotic heart disease of native coronary artery without angina pectoris: Secondary | ICD-10-CM | POA: Diagnosis present

## 2011-11-30 DIAGNOSIS — Z880 Allergy status to penicillin: Secondary | ICD-10-CM

## 2011-11-30 DIAGNOSIS — J449 Chronic obstructive pulmonary disease, unspecified: Secondary | ICD-10-CM | POA: Diagnosis present

## 2011-11-30 LAB — DIFFERENTIAL
Basophils Relative: 0 % (ref 0–1)
Eosinophils Absolute: 0.1 10*3/uL (ref 0.0–0.7)
Eosinophils Relative: 1 % (ref 0–5)
Monocytes Absolute: 0.5 10*3/uL (ref 0.1–1.0)
Monocytes Relative: 5 % (ref 3–12)
Neutro Abs: 5 10*3/uL (ref 1.7–7.7)

## 2011-11-30 LAB — COMPREHENSIVE METABOLIC PANEL
Albumin: 3.4 g/dL — ABNORMAL LOW (ref 3.5–5.2)
BUN: 17 mg/dL (ref 6–23)
Calcium: 9.7 mg/dL (ref 8.4–10.5)
Chloride: 94 mEq/L — ABNORMAL LOW (ref 96–112)
Creatinine, Ser: 0.57 mg/dL (ref 0.50–1.10)
Total Bilirubin: 0.2 mg/dL — ABNORMAL LOW (ref 0.3–1.2)

## 2011-11-30 LAB — CBC
HCT: 42.9 % (ref 36.0–46.0)
Hemoglobin: 15 g/dL (ref 12.0–15.0)
MCH: 30.5 pg (ref 26.0–34.0)
MCHC: 35 g/dL (ref 30.0–36.0)
MCV: 87.4 fL (ref 78.0–100.0)
RDW: 13.4 % (ref 11.5–15.5)

## 2011-11-30 LAB — URINALYSIS, ROUTINE W REFLEX MICROSCOPIC
Ketones, ur: 40 mg/dL — AB
Leukocytes, UA: NEGATIVE
Nitrite: NEGATIVE
Protein, ur: >300 mg/dL — AB
pH: 7 (ref 5.0–8.0)

## 2011-11-30 LAB — URINE MICROSCOPIC-ADD ON

## 2011-11-30 LAB — LIPASE, BLOOD: Lipase: 33 U/L (ref 11–59)

## 2011-11-30 MED ORDER — PROMETHAZINE HCL 25 MG/ML IJ SOLN
25.0000 mg | Freq: Once | INTRAMUSCULAR | Status: AC
  Administered 2011-11-30: 25 mg via INTRAMUSCULAR
  Filled 2011-11-30: qty 1

## 2011-11-30 MED ORDER — HYDROMORPHONE HCL PF 2 MG/ML IJ SOLN
2.0000 mg | Freq: Once | INTRAMUSCULAR | Status: AC
  Administered 2011-11-30: 2 mg via INTRAMUSCULAR
  Filled 2011-11-30: qty 1

## 2011-11-30 MED ORDER — HYDROMORPHONE HCL PF 1 MG/ML IJ SOLN
1.0000 mg | Freq: Once | INTRAMUSCULAR | Status: AC
  Administered 2011-11-30: 1 mg via INTRAVENOUS
  Filled 2011-11-30: qty 1

## 2011-11-30 MED ORDER — METOCLOPRAMIDE HCL 5 MG/ML IJ SOLN
10.0000 mg | Freq: Once | INTRAMUSCULAR | Status: AC
  Administered 2011-11-30: 10 mg via INTRAVENOUS
  Filled 2011-11-30: qty 2

## 2011-11-30 MED ORDER — SODIUM CHLORIDE 0.9 % IV BOLUS (SEPSIS)
1000.0000 mL | Freq: Once | INTRAVENOUS | Status: AC
  Administered 2011-11-30: 1000 mL via INTRAVENOUS

## 2011-11-30 MED ORDER — SODIUM CHLORIDE 0.9 % IV SOLN
INTRAVENOUS | Status: DC
  Administered 2011-12-01: 01:00:00 via INTRAVENOUS

## 2011-11-30 NOTE — H&P (Signed)
History and Physical  [name] [id number] DOB: Jan 12, 1971 DOA: 11/30/2011  Referring physician: Trisha Mangle, M.D. PCP: [name], MD, MD  Gastroenterologist: Laurance Flatten, M.D. [facility], [place])  Chief Complaint: Abdominal pain  HPI:  41 year old woman with a history of gastroparesis and pancreatitis presented emergency department with a two-day history of abdominal pain, nausea and vomiting. Pain began last night and current intensity 7/10. She has run out of oral Dilaudid that she was prescribed previously in the emergency department. No specific aggravating or alleviating factors described. Pain is located in the epigastric and left upper quadrant areas. Associated with vomiting. Reports large bowel movement recently.  Noted to be afebrile with stable vital signs in the emergency department.  Afebrile, vital signs stable. Hypoglycemic. Abdominal x-ray revealed moderate stool suggesting constipation.  Chart Review:  11/24/2011 emergency department visit: Vomiting. Discharged home.  11/14/2011 emergency department visit: Abdominal pain, nausea, vomiting. Discharged home.  11/11/2011 emergency department visit: Abdominal pain, nausea, vomiting.  11/17/2011 emergency department visit: Abdominal pain, nausea, vomiting.  Review of Systems:  Negative for fever, changes to her vision, sore throat, rash, new muscle aches, shortness of breath, dysuria, bleeding. No recent chest pain.  Past Medical History  Diagnosis Date  . Hypertension   . Diabetes mellitus   . Gastroparesis   . Asthma   . GERD (gastroesophageal reflux disease)   . Coronary artery disease   . h/o seizure   . Neuropathy   . Chronic pancreatitis   . Dyslipidemia   . MI (myocardial infarction)   . COPD (chronic obstructive pulmonary disease)    Past Surgical History  Procedure Date  . Abdominal hysterectomy   . Back surgery   . Cholecystectomy   . Peg tube x 4   . Cesarean section    Social History:   reports that she has been smoking Cigarettes.  She has a .5 pack-year smoking history. She has never used smokeless tobacco. She reports that she does not drink alcohol or use illicit drugs.  Allergies  Allergen Reactions  . Compazine Hives  . Darvocet (Propoxyphene-Acetaminophen) Other (See Comments)    UNKNOWN  . Nsaids Other (See Comments)    UNKNOWN  . Penicillins Hives  . Zofran Hives   Family History  Problem Relation Age of Onset  . Cancer Mother    Prior to Admission medications   Medication Sig Start Date End Date Taking? Authorizing Provider  aspirin EC 81 MG EC tablet Take 1 tablet (81 mg total) by mouth daily. 06/12/11 06/11/12 Yes Novlet Thea Alken, MD  atorvastatin (LIPITOR) 40 MG tablet Take 1 tablet (40 mg total) by mouth daily. 06/12/11 06/11/12 Yes Novlet Thea Alken, MD  esomeprazole (NEXIUM) 40 MG capsule Take 40 mg by mouth daily before breakfast.   Yes Historical Provider, MD  Fluticasone-Salmeterol (ADVAIR) 250-50 MCG/DOSE AEPB Inhale 1 puff into the lungs 2 (two) times daily. 06/12/11  Yes Novlet Thea Alken, MD  gabapentin (NEURONTIN) 600 MG tablet Take 1 tablet (600 mg total) by mouth 3 (three) times daily. 08/12/11 08/11/12 Yes Lezlie Octave Black, NP  insulin glargine (LANTUS) 100 UNIT/ML injection Inject 10 Units into the skin at bedtime. 08/12/11 08/11/12 Yes Lezlie Octave Black, NP  insulin lispro (HUMALOG) 100 UNIT/ML injection Inject 10-20 Units into the skin 3 (three) times daily before meals. Sliding scale 08/12/11  Yes Lezlie Octave Black, NP  lisinopril (PRINIVIL) 10 MG tablet Take 2 tablets (20 mg total) by mouth daily. 08/12/11 08/11/12 Yes Radene Gunning, NP  metoCLOPramide (REGLAN) 10 MG tablet Take 1 tablet (10 mg total) by mouth every 6 (six) hours. 11/24/11 12/04/11 Yes Barbara Cower, MD  metoprolol succinate (TOPROL XL) 50 MG 24 hr tablet Take 1 tablet (50 mg total) by mouth daily. 08/12/11 08/11/12 Yes Lezlie Octave Black, NP  Multiple Vitamin (MULITIVITAMIN WITH MINERALS) TABS Take 1  tablet by mouth daily.   Yes Historical Provider, MD  pantoprazole (PROTONIX) 20 MG tablet Take 1 tablet (20 mg total) by mouth daily. 11/14/11 11/13/12 Yes Barbara Cower, MD  promethazine (PHENERGAN) 25 MG suppository Place 25 mg rectally every 6 (six) hours as needed. For nausea/vomiting.   Yes Historical Provider, MD  metoCLOPramide (REGLAN) 10 MG tablet Take 1 tablet (10 mg total) by mouth every 6 (six) hours. 11/14/11 11/24/11  Barbara Cower, MD   Physical Exam: Filed Vitals:   11/30/11 1444 11/30/11 1446 11/30/11 1959  BP: 170/90 164/100 124/79  Pulse: 100 104   Temp:  98.1 F (36.7 C) 98.1 F (36.7 C)  TempSrc:  Oral Oral  Resp: 18 24 16   SpO2:  99% 97%    General:  Appears mildly uncomfortable. Calm. Examined in the emergency department the  Eyes: Pupils appear round and grossly unremarkable. Wears contact lenses with blue faux-iris artificial coloring.  ENT: Grossly normal hearing. Upper dentures. Lips and tongue appear grossly unremarkable.  Neck: No lymphadenopathy or masses appreciated. No thyromegaly  Cardiovascular: Regular rate and rhythm. No murmur, rub, gallop. No lower extremity edema.  Respiratory: Clear to auscultation bilaterally. No wheezes, rales, rhonchi. Normal respiratory effort.  Abdomen: Soft. Low midline vertical incision consistent with history of hysterectomy, well healed. Well-healed incisions from previous percutaneous feeding tubes noted. Abdomen is soft and diffusely tender, however exam appears to be benign.  Skin: Grossly unremarkable.  Musculoskeletal: Grossly unremarkable  Psychiatric: Grossly normal mood and affect. Speech fluent and appropriate.  Labs on Admission:  Basic Metabolic Panel:  Lab [id number] 1542 11/24/11 1740  NA 138 136  K 3.8 3.4*  CL 94* 98  CO2 32 25  GLUCOSE 292* 294*  BUN 17 15  CREATININE 0.57 0.61  CALCIUM 9.7 9.8  MG -- --  PHOS -- --   Liver Function Tests:  Lab 11/30/11 1542  AST 26  ALT 12    ALKPHOS 108  BILITOT 0.2*  PROT 8.1  ALBUMIN 3.4*    Lab 11/30/11 1542 11/24/11 1740  LIPASE 33 21  AMYLASE -- --   CBC:  Lab 11/30/11 1542 11/24/11 1740  WBC 8.8 7.1  NEUTROABS 5.0 5.0  HGB 15.0 14.3  HCT 42.9 41.7  MCV 87.4 87.6  PLT 228 188   CBG:  Lab 11/30/11 1544 11/24/11 2128 11/24/11 1812  GLUCAP 259* 197* 270*   Radiological Exams on Admission: Dg Abd Acute W/chest  11/30/2011  *RADIOLOGY REPORT*  Clinical Data: Vomiting, gastroparesis  ACUTE ABDOMEN SERIES (ABDOMEN 2 VIEW & CHEST 1 VIEW)  Comparison: Plain film 11/24/2011  Findings: There are bowel surgical clips in the left abdomen.  No dilated loops of large or small bowel.  There is stool throughout the colon and rectum.  Lung bases are clear.  No free air in the diaphragm.  Heart is normal.  No pathologic calcifications.  Prior cholecystectomy.  IMPRESSION:  1.  Lungs are clear.  2.  No evidence intraperitoneal free air or bowel obstruction. 3.  Moderate volume stool throughout the colon suggest constipation.  Original Report Authenticated By: Suzy Bouchard, M.D.   EKG: Independently reviewed. Normal  sinus rhythm. No acute changes.  Assessment/Plan 1. Acute/chronic abdominal pain: Suspect largely secondary to constipation, superimposed on acute/chronic pain from gastroparesis and chronic pancreatitis. Supportive care. IV fluids. Antiemetics. Minimize narcotic use as possible. 2. Nausea/vomiting: No history to to suggest viral/infectious nature. Suspect acute flare of gastroparesis complicated by chronic pancreatitis. 3. Constipation: Suppository. Enema if needed. Oral agents if can tolerate. 4. Gastroparesis: Continue Reglan. Antiemetics. Small sips of liquids when able to tolerate liquids. 5. Diabetes mellitus, presumed uncontrolled: Continue Lantus. Check hemoglobin A1c. 6. History chronic pancreatitis: Supportive care. 7. Cigarette smoker  Code Status: Full code. Family Communication: None at  bedside. Disposition Plan: Pending further evaluation and treatment.  Murray Hodgkins, MD  [facility] Pager [phone] 11/30/2011, 9:32 PM

## 2011-11-30 NOTE — ED Provider Notes (Signed)
History     CSN: [id number]  Arrival date & time 11/30/11  1442   First MD Initiated Contact with Patient 11/30/11 1459      Chief Complaint  Patient presents with  . Abdominal Pain    (Consider location/radiation/quality/duration/timing/severity/associated sxs/prior treatment) HPI Comments: History of gastroparesis and pancreatitis. States this is a flareup of these conditions. Unsure what her home blood glucoses. States she has been taking her medication as directed.  Patient is a 41 y.o. female presenting with abdominal pain. The history is provided by the patient. No language interpreter was used.  Abdominal Pain The primary symptoms of the illness include abdominal pain, fatigue, nausea and vomiting. The primary symptoms of the illness do not include fever, shortness of breath, diarrhea, hematemesis, hematochezia or dysuria. The current episode started yesterday. The onset of the illness was gradual. The problem has been gradually worsening.  The abdominal pain began yesterday. The pain came on gradually. The abdominal pain has been gradually worsening since its onset. The abdominal pain is generalized. The abdominal pain radiates to the epigastric region. The abdominal pain is relieved by nothing. The abdominal pain is exacerbated by vomiting.  The fatigue began yesterday. The fatigue has been worsening since its onset.  Nausea began yesterday. The nausea is associated with eating. The nausea is exacerbated by food.  The vomiting began yesterday. Vomiting occurs 6 to 10 times per day. The emesis contains stomach contents.  Symptoms associated with the illness do not include constipation or back pain. Significant associated medical issues include diabetes.    Past Medical History  Diagnosis Date  . Hypertension   . Diabetes mellitus   . Gastroparesis   . Asthma   . GERD (gastroesophageal reflux disease)   . Coronary artery disease   . h/o seizure   . Neuropathy   . Chronic  pancreatitis   . Dyslipidemia   . MI (myocardial infarction)   . COPD (chronic obstructive pulmonary disease)     Past Surgical History  Procedure Date  . Abdominal hysterectomy   . Back surgery   . Cholecystectomy   . Peg tube x 4   . Cesarean section     Family History  Problem Relation Age of Onset  . Cancer Mother     History  Substance Use Topics  . Smoking status: Current Everyday Smoker -- 0.2 packs/day for 2 years    Types: Cigarettes  . Smokeless tobacco: Never Used  . Alcohol Use: No     hx 1 beer daily -pt states she quit Dec. 2012    OB History    Grav Para Term Preterm Abortions TAB SAB Ect Mult Living                  Review of Systems  Constitutional: Positive for appetite change and fatigue. Negative for fever and activity change.  HENT: Negative for congestion, sore throat, rhinorrhea, neck pain and neck stiffness.   Respiratory: Negative for cough and shortness of breath.   Cardiovascular: Negative for chest pain and palpitations.  Gastrointestinal: Positive for nausea, vomiting and abdominal pain. Negative for diarrhea, constipation, hematochezia and hematemesis.  Genitourinary: Negative for dysuria.  Musculoskeletal: Negative for myalgias, back pain and arthralgias.  Neurological: Negative for dizziness, weakness, light-headedness, numbness and headaches.  All other systems reviewed and are negative.    Allergies  Compazine; Darvocet; Nsaids; Penicillins; and Zofran  Home Medications   Current Outpatient Rx  Name Route Sig Dispense Refill  . ASPIRIN  81 MG PO TBEC Oral Take 1 tablet (81 mg total) by mouth daily. 30 tablet 0  . ATORVASTATIN CALCIUM 40 MG PO TABS Oral Take 1 tablet (40 mg total) by mouth daily. 30 tablet 0  . ESOMEPRAZOLE MAGNESIUM 40 MG PO CPDR Oral Take 40 mg by mouth daily before breakfast.    . FLUTICASONE-SALMETEROL 250-50 MCG/DOSE IN AEPB Inhalation Inhale 1 puff into the lungs 2 (two) times daily. 60 each 0  .  GABAPENTIN 600 MG PO TABS Oral Take 1 tablet (600 mg total) by mouth 3 (three) times daily. 90 tablet 0  . INSULIN GLARGINE 100 UNIT/ML [place] SOLN Subcutaneous Inject 10 Units into the skin at bedtime. 3 mL 4    Please give pen  . INSULIN LISPRO (HUMAN) 100 UNIT/ML [place] SOLN Subcutaneous Inject 10-20 Units into the skin 3 (three) times daily before meals. Sliding scale 10 mL 2  . LISINOPRIL 10 MG PO TABS Oral Take 2 tablets (20 mg total) by mouth daily. 60 tablet 0  . METOCLOPRAMIDE HCL 10 MG PO TABS Oral Take 1 tablet (10 mg total) by mouth every 6 (six) hours. 30 tablet 0  . METOPROLOL SUCCINATE ER 50 MG PO TB24 Oral Take 1 tablet (50 mg total) by mouth daily. 30 tablet 0  . ADULT MULTIVITAMIN W/MINERALS CH Oral Take 1 tablet by mouth daily.    Marland Kitchen PANTOPRAZOLE SODIUM 20 MG PO TBEC Oral Take 1 tablet (20 mg total) by mouth daily. 30 tablet 0  . PROMETHAZINE HCL 25 MG RE SUPP Rectal Place 25 mg rectally every 6 (six) hours as needed. For nausea/vomiting.    Marland Kitchen METOCLOPRAMIDE HCL 10 MG PO TABS Oral Take 1 tablet (10 mg total) by mouth every 6 (six) hours. 30 tablet 0    BP 124/79  Pulse 104  Temp(Src) 98.1 F (36.7 C) (Oral)  Resp 16  SpO2 97%  Physical Exam  Nursing note and vitals reviewed. Constitutional: She is oriented to person, place, and time. She appears well-developed and well-nourished.       Appears uncomfortable  HENT:  Head: Normocephalic and atraumatic.  Mouth/Throat: Oropharynx is clear and moist.  Eyes: Conjunctivae and EOM are normal. Pupils are equal, round, and reactive to light.  Neck: Normal range of motion. Neck supple.  Cardiovascular: Normal rate, regular rhythm, normal heart sounds and intact distal pulses.  Exam reveals no gallop and no friction rub.   No murmur heard. Pulmonary/Chest: Effort normal and breath sounds normal. No respiratory distress. She has no wheezes. She exhibits no tenderness.  Abdominal: Soft. Bowel sounds are normal. There is tenderness  (diffusely tender but worst in the epigastrium). There is no rebound and no guarding.  Musculoskeletal: Normal range of motion. She exhibits no edema and no tenderness.  Neurological: She is alert and oriented to person, place, and time. No cranial nerve deficit.  Skin: Skin is warm and dry. No rash noted.    ED Course  Angiocath insertion Date/Time: 11/30/2011 11:19 PM Performed by: Trisha Mangle Authorized by: Trisha Mangle Consent: Verbal consent obtained. Written consent not obtained. Consent given by: patient Required items: required blood products, implants, devices, and special equipment available Patient identity confirmed: verbally with patient Local anesthesia used: no Patient sedated: no Patient tolerance: Patient tolerated the procedure well with no immediate complications. Comments: 20-gauge peripheral IV was placed in the right arm as well as the left arm using ultrasound guidance.   (including critical care time)  CRITICAL CARE Performed by: Edison Pace,  Preslynn Bier   Total critical care time: 30 min  Critical care time was exclusive of separately billable procedures and treating other patients.  Critical care was necessary to treat or prevent imminent or life-threatening deterioration.  Critical care was time spent personally by me on the following activities: development of treatment plan with patient and/or surrogate as well as nursing, discussions with consultants, evaluation of patient's response to treatment, examination of patient, obtaining history from patient or surrogate, ordering and performing treatments and interventions, ordering and review of laboratory studies, ordering and review of radiographic studies, pulse oximetry and re-evaluation of patient's condition.   Date: 11/30/2011  Rate: 93  Rhythm: normal sinus rhythm  QRS Axis: left  Intervals: normal  ST/T Wave abnormalities: normal  Conduction Disutrbances:none  Narrative Interpretation:   Old EKG  Reviewed: unchanged  Labs Reviewed  COMPREHENSIVE METABOLIC PANEL - Abnormal; Notable for the following:    Chloride 94 (*)    Glucose, Bld 292 (*)    Albumin 3.4 (*)    Total Bilirubin 0.2 (*)    All other components within normal limits  URINALYSIS, ROUTINE W REFLEX MICROSCOPIC - Abnormal; Notable for the following:    APPearance CLOUDY (*)    Glucose, UA 500 (*)    Hgb urine dipstick MODERATE (*)    Ketones, ur 40 (*)    Protein, ur >300 (*)    All other components within normal limits  GLUCOSE, CAPILLARY - Abnormal; Notable for the following:    Glucose-Capillary 259 (*)    All other components within normal limits  URINE MICROSCOPIC-ADD ON - Abnormal; Notable for the following:    Bacteria, UA MANY (*)    All other components within normal limits  CBC  DIFFERENTIAL  LIPASE, BLOOD  LACTIC ACID, PLASMA   Dg Abd Acute W/chest  11/30/2011  *RADIOLOGY REPORT*  Clinical Data: Vomiting, gastroparesis  ACUTE ABDOMEN SERIES (ABDOMEN 2 VIEW & CHEST 1 VIEW)  Comparison: Plain film 11/24/2011  Findings: There are bowel surgical clips in the left abdomen.  No dilated loops of large or small bowel.  There is stool throughout the colon and rectum.  Lung bases are clear.  No free air in the diaphragm.  Heart is normal.  No pathologic calcifications.  Prior cholecystectomy.  IMPRESSION:  1.  Lungs are clear.  2.  No evidence intraperitoneal free air or bowel obstruction. 3.  Moderate volume stool throughout the colon suggest constipation.  Original Report Authenticated By: Suzy Bouchard, M.D.     1. Gastroparesis   2. DM (diabetes mellitus), type 2, uncontrolled   3. Nausea and vomiting       MDM  Abdominal pain and persistent nausea and vomiting secondary to gastroparesis. Blood glucose 259. She is a difficult IV stick. She required ultrasound-guided IV twice. She'll receive IV fluids. She received multiple doses of antiemetics, pain medication. She is unable to tolerate oral intake.  She'll be admitted to the hospital for further evaluation and treatment. She'll require additional antiemetics and IV fluids. Discussed with the [facility] who accepted the patient for admission after IV was established.        Trisha Mangle, MD 11/30/11 2320

## 2011-11-30 NOTE — ED Notes (Signed)
Pt.'s IV no longer functional.  MD notified.  Attempted po fluids.  Pt. Not tolerating. MD aware.

## 2011-11-30 NOTE — ED Notes (Signed)
Pt. Not actively vomiting but reports she is still having nausea.

## 2011-11-30 NOTE — ED Notes (Signed)
Pt states hx of pancreatitis w/ pain in upper abdomen since yesterday.  States pain is similar but worse.

## 2011-11-30 NOTE — ED Notes (Signed)
Pt notified of need for urine. Pt unable to urinate at this time.

## 2011-11-30 NOTE — ED Notes (Signed)
Assisted MD with IV placement using ultrasound.  Pt. Having nausea, vomiting, and abdominal pain which started yesterday.  20G placed in left [facility]

## 2011-11-30 NOTE — ED Notes (Signed)
MD attempted IV line.  No success. IV team paged.

## 2011-11-30 NOTE — ED Notes (Signed)
Per EMS: Pt c/o sharp upper abdominal pain since yesterday.  Has had pancreatitis about a week ago.  States pain is similar but worse.  Multiple episodes of vomiting.

## 2011-12-01 ENCOUNTER — Encounter (HOSPITAL_COMMUNITY): Payer: Self-pay | Admitting: *Deleted

## 2011-12-01 DIAGNOSIS — K5900 Constipation, unspecified: Secondary | ICD-10-CM | POA: Diagnosis present

## 2011-12-01 DIAGNOSIS — E1165 Type 2 diabetes mellitus with hyperglycemia: Secondary | ICD-10-CM

## 2011-12-01 DIAGNOSIS — E876 Hypokalemia: Secondary | ICD-10-CM | POA: Diagnosis present

## 2011-12-01 DIAGNOSIS — R109 Unspecified abdominal pain: Secondary | ICD-10-CM | POA: Diagnosis present

## 2011-12-01 DIAGNOSIS — R1084 Generalized abdominal pain: Secondary | ICD-10-CM

## 2011-12-01 DIAGNOSIS — E785 Hyperlipidemia, unspecified: Secondary | ICD-10-CM | POA: Diagnosis present

## 2011-12-01 DIAGNOSIS — R112 Nausea with vomiting, unspecified: Secondary | ICD-10-CM

## 2011-12-01 LAB — PREGNANCY, URINE: Preg Test, Ur: NEGATIVE

## 2011-12-01 LAB — GLUCOSE, CAPILLARY
Glucose-Capillary: 140 mg/dL — ABNORMAL HIGH (ref 70–99)
Glucose-Capillary: 102 mg/dL — ABNORMAL HIGH (ref 70–99)

## 2011-12-01 LAB — BASIC METABOLIC PANEL
BUN: 12 mg/dL (ref 6–23)
Chloride: 100 mEq/L (ref 96–112)
GFR calc Af Amer: >90 mL/min (ref >90)
Potassium: 3 mEq/L — ABNORMAL LOW (ref 3.5–5.1)

## 2011-12-01 LAB — HEMOGLOBIN A1C
Hgb A1c MFr Bld: 9.7 % — ABNORMAL HIGH (ref <5.7)
Mean Plasma Glucose: 232 mg/dL — ABNORMAL HIGH (ref <117)

## 2011-12-01 MED ORDER — METOCLOPRAMIDE HCL 5 MG/ML IJ SOLN
5.0000 mg | Freq: Four times a day (QID) | INTRAMUSCULAR | Status: DC
  Administered 2011-12-01 – 2011-12-02 (×6): 5 mg via INTRAVENOUS
  Filled 2011-12-01 (×10): qty 1

## 2011-12-01 MED ORDER — ZOLPIDEM TARTRATE 5 MG PO TABS: 5.0000 mg | ORAL_TABLET | Freq: Every evening | ORAL | Status: DC | PRN

## 2011-12-01 MED ORDER — BISACODYL 10 MG RE SUPP: 10.0000 mg | Freq: Every day | RECTAL | Status: DC | PRN

## 2011-12-01 MED ORDER — SENNA 8.6 MG PO TABS
1.0000 | ORAL_TABLET | Freq: Two times a day (BID) | ORAL | Status: DC
  Administered 2011-12-01 – 2011-12-02 (×3): 8.6 mg via ORAL
  Filled 2011-12-01 (×3): qty 1

## 2011-12-01 MED ORDER — INSULIN GLARGINE 100 UNIT/ML ~~LOC~~ SOLN
10.0000 [IU] | Freq: Every day | SUBCUTANEOUS | Status: DC
  Administered 2011-12-01: 10 [IU] via SUBCUTANEOUS

## 2011-12-01 MED ORDER — GABAPENTIN 600 MG PO TABS
600.0000 mg | ORAL_TABLET | Freq: Three times a day (TID) | ORAL | Status: DC
  Administered 2011-12-01 – 2011-12-02 (×5): 600 mg via ORAL
  Filled 2011-12-01 (×6): qty 1

## 2011-12-01 MED ORDER — INSULIN ASPART 100 UNIT/ML ~~LOC~~ SOLN
0.0000 [IU] | Freq: Three times a day (TID) | SUBCUTANEOUS | Status: DC
  Administered 2011-12-01 – 2011-12-02 (×3): 1 [IU] via SUBCUTANEOUS

## 2011-12-01 MED ORDER — SODIUM CHLORIDE 0.9 % IV SOLN
INTRAVENOUS | Status: DC
  Administered 2011-12-01: 09:00:00 via INTRAVENOUS

## 2011-12-01 MED ORDER — PROMETHAZINE HCL 25 MG PO TABS
12.5000 mg | ORAL_TABLET | Freq: Four times a day (QID) | ORAL | Status: DC | PRN
  Administered 2011-12-01 – 2011-12-02 (×3): 12.5 mg via ORAL
  Filled 2011-12-01 (×3): qty 1

## 2011-12-01 MED ORDER — FLEET ENEMA 7-19 GM/118ML RE ENEM: 1.0000 | ENEMA | Freq: Once | RECTAL | Status: AC | PRN

## 2011-12-01 MED ORDER — FLUTICASONE-SALMETEROL 250-50 MCG/DOSE IN AEPB
1.0000 | INHALATION_SPRAY | Freq: Two times a day (BID) | RESPIRATORY_TRACT | Status: DC
  Administered 2011-12-01 – 2011-12-02 (×3): 1 via RESPIRATORY_TRACT
  Filled 2011-12-01: qty 14

## 2011-12-01 MED ORDER — LISINOPRIL 20 MG PO TABS
20.0000 mg | ORAL_TABLET | Freq: Every day | ORAL | Status: DC
  Administered 2011-12-01 – 2011-12-02 (×2): 20 mg via ORAL
  Filled 2011-12-01 (×2): qty 1

## 2011-12-01 MED ORDER — ASPIRIN EC 81 MG PO TBEC
81.0000 mg | DELAYED_RELEASE_TABLET | Freq: Every day | ORAL | Status: DC
  Administered 2011-12-01 – 2011-12-02 (×2): 81 mg via ORAL
  Filled 2011-12-01 (×2): qty 1

## 2011-12-01 MED ORDER — DOCUSATE SODIUM 100 MG PO CAPS
100.0000 mg | ORAL_CAPSULE | Freq: Two times a day (BID) | ORAL | Status: DC
  Administered 2011-12-01 – 2011-12-02 (×3): 100 mg via ORAL
  Filled 2011-12-01 (×4): qty 1

## 2011-12-01 MED ORDER — HYDROMORPHONE HCL PF 1 MG/ML IJ SOLN
1.0000 mg | Freq: Once | INTRAMUSCULAR | Status: AC
  Administered 2011-12-01: 1 mg via INTRAVENOUS
  Filled 2011-12-01: qty 1

## 2011-12-01 MED ORDER — PROMETHAZINE HCL 25 MG/ML IJ SOLN
12.5000 mg | Freq: Once | INTRAMUSCULAR | Status: AC
  Administered 2011-12-01: 12.5 mg via INTRAVENOUS
  Filled 2011-12-01: qty 1

## 2011-12-01 MED ORDER — HYDROMORPHONE HCL PF 1 MG/ML IJ SOLN
1.0000 mg | INTRAMUSCULAR | Status: AC | PRN
  Administered 2011-12-01 (×2): 1 mg via INTRAVENOUS
  Filled 2011-12-01 (×3): qty 1

## 2011-12-01 MED ORDER — POTASSIUM CHLORIDE IN NACL 40-0.9 MEQ/L-% IV SOLN
INTRAVENOUS | Status: DC
  Administered 2011-12-01 – 2011-12-02 (×3): via INTRAVENOUS
  Filled 2011-12-01 (×3): qty 1000

## 2011-12-01 MED ORDER — METOPROLOL SUCCINATE ER 50 MG PO TB24
50.0000 mg | ORAL_TABLET | Freq: Every day | ORAL | Status: DC
  Administered 2011-12-01 – 2011-12-02 (×2): 50 mg via ORAL
  Filled 2011-12-01 (×2): qty 1

## 2011-12-01 MED ORDER — ATORVASTATIN CALCIUM 40 MG PO TABS
40.0000 mg | ORAL_TABLET | Freq: Every day | ORAL | Status: DC
  Administered 2011-12-01 – 2011-12-02 (×2): 40 mg via ORAL
  Filled 2011-12-01 (×2): qty 1

## 2011-12-01 MED ORDER — PANTOPRAZOLE SODIUM 40 MG IV SOLR
40.0000 mg | INTRAVENOUS | Status: DC
  Administered 2011-12-01 – 2011-12-02 (×2): 40 mg via INTRAVENOUS
  Filled 2011-12-01 (×2): qty 40

## 2011-12-01 MED ORDER — HYDROMORPHONE HCL PF 1 MG/ML IJ SOLN
0.5000 mg | INTRAMUSCULAR | Status: DC | PRN
  Administered 2011-12-01 – 2011-12-02 (×5): 0.5 mg via INTRAVENOUS
  Filled 2011-12-01 (×4): qty 1

## 2011-12-01 MED ORDER — SODIUM CHLORIDE 0.9 % IV SOLN: INTRAVENOUS | Status: AC

## 2011-12-01 NOTE — ED Notes (Signed)
Attempted to call report. RN to call back. 

## 2011-12-01 NOTE — Progress Notes (Signed)
PROGRESS NOTE  [name] [id number] DOB: 1970/10/08 DOA: 11/30/2011 PCP: [name], MD, MD  Brief narrative: [name] is a 41 year old female with a past medical history of gastroparesis and chronic pancreatitis who presented to the hospital on 11/30/2011 with a chief complaint of abdominal pain accompanied by nausea and vomiting. She was admitted for symptom control.  Assessment/Plan: Principal Problem:  *Acute on chronic Abdominal pain with nausea and vomiting secondary to chronic pancreatitis, gastroparesis, and probable cyclic vomiting syndrome  Likely multifactorial. Doubt chronic pancreatitis but has a diagnosis of gastroparesis, is constipated and possibly is having intermittent ketosis related to uncontrolled diabetes which all may be contributory. Narcotic use likely exacerbating symptoms. Encouraged patient to use these sparingly.  Treat nausea with antinausea medicines.  Continue Reglan for gastroparesis to increase bowel motility.  Lipase levels within normal limits.  No pancreatic calcifications noted on imaging studies so the diagnosis of chronic pancreatitis is in question.  X-rays done on admission showed constipation which may be exacerbating symptoms, again a consequence of chronic narcotic use.  On twice a day Senokot. Will add daily MiraLAX. Active Problems:  Gastroparesis with nausea and vomiting  No nuclear medicine study in hospital records confirming this diagnosis. Patient states that this was diagnosed at another facility.  Continue Reglan.  Given persistent symptoms, would downgrade diet clear liquids only.  HTN (hypertension)  Currently on lisinopril and metoprolol  Blood pressure controlled.  Chronic pancreatitis  No pancreatic calcifications noted on any imaging studies.  Lipase levels consistently within normal limits.  Unclear diagnosis. States that this diagnosis was made after she had her gallbladder removed. This was done at outside  facility.  Dyslipidemia  Currently on Lipitor.  GERD (gastroesophageal reflux disease)  Currently on Protonix.  DM (diabetes mellitus), type 2, uncontrolled  Hemoglobin A1c 9.7% (goal less than 7%) which corresponds to a mean plasma glucose of 268.  Needs better glycemic control.  Consult diabetes coordinator for further patient education.  Hypokalemia  Likely secondary to nausea and vomiting. Replace in IV fluids.  Constipation  Continue Senokot.  Add MiraLAX   Code Status: Full  Family Communication: None at bedside. Disposition Plan: Home, when stable  Medical Consultants:  None  Other consultants:  Diabetes coordinator  Antibiotics:  None   Subjective  [name] currently is complaining of ongoing problems with nausea and vomiting. She is only sipping on clear liquids because of her symptoms. She continues to have significant abdominal pain, centrally located.   Objective    Interim History: Stable overnight.   Objective: Filed Vitals:   12/01/11 0028 12/01/11 0152 12/01/11 0615 12/01/11 0908  BP: 126/78 156/94 143/88   Pulse:  88 80   Temp: 98.4 F (36.9 C) 98.9 F (37.2 C) 98.4 F (36.9 C)   TempSrc: Oral Oral Oral   Resp: 18 18 18    Height:  5\' 5"  (1.651 m)    Weight:  63.4 kg (139 lb 12.4 oz)    SpO2: 99% 98% 100% 99%    Intake/Output Summary (Last 24 hours) at 12/01/11 1123 Last data filed at 12/01/11 [id number]  Gross per 24 hour  Intake      0 ml  Output    300 ml  Net   -300 ml    Exam: Gen:  NAD Cardiovascular:  RRR, No M/R/G Respiratory: Lungs CTAB Gastrointestinal: Abdomen soft, exaggerated response to palpation but no frank rebound. Bowel sounds present x4. Extremities: No C/E/C    Data Reviewed: Basic Metabolic Panel:  Lab 12/01/11 0415 11/30/11 1542 11/24/11 1740  NA 138 138 136  K 3.0* 3.8 --  CL 100 94* 98  CO2 28 32 25  GLUCOSE 138* 292* 294*  BUN 12 17 15   CREATININE 0.41* 0.57 0.61  CALCIUM 8.6 9.7 9.8    MG -- -- --  PHOS -- -- --   GFR Estimated Creatinine Clearance: 83.3 ml/min (by C-G formula based on Cr of 0.41). Liver Function Tests:  Lab 11/30/11 1542  AST 26  ALT 12  ALKPHOS 108  BILITOT 0.2*  PROT 8.1  ALBUMIN 3.4*    Lab 11/30/11 1542 11/24/11 1740  LIPASE 33 21  AMYLASE -- --    CBC:  Lab 11/30/11 1542 11/24/11 1740  WBC 8.8 7.1  NEUTROABS 5.0 5.0  HGB 15.0 14.3  HCT 42.9 41.7  MCV 87.4 87.6  PLT 228 188   CBG:  Lab 12/01/11 0818 12/01/11 0027 11/30/11 1544 11/24/11 2128 11/24/11 1812  GLUCAP 140* 154* 259* 197* 270*   Hgb A1c  Basename 12/01/11 0415  HGBA1C 9.7*    Procedures and Diagnostic Studies:  Dg Abd Acute W/chest 11/30/2011 IMPRESSION:  1.  Lungs are clear.  2.  No evidence intraperitoneal free air or bowel obstruction. 3.  Moderate volume stool throughout the colon suggest constipation.  Original Report Authenticated By: Suzy Bouchard, M.D.     Scheduled Meds:    . sodium chloride   Intravenous STAT  . aspirin EC  81 mg Oral Daily  . atorvastatin  40 mg Oral Daily  . docusate sodium  100 mg Oral BID  . Fluticasone-Salmeterol  1 puff Inhalation BID  . gabapentin  600 mg Oral TID  .  HYDROmorphone (DILAUDID) injection  1 mg Intravenous Once  .  HYDROmorphone (DILAUDID) injection  1 mg Intravenous Once  .  HYDROmorphone (DILAUDID) injection  1 mg Intravenous Once  .  HYDROmorphone (DILAUDID) injection  1 mg Intravenous Once  .  HYDROmorphone (DILAUDID) injection  2 mg Intramuscular Once  . insulin aspart  0-9 Units Subcutaneous TID WC  . insulin glargine  10 Units Subcutaneous QHS  . lisinopril  20 mg Oral Daily  . metoCLOPramide (REGLAN) injection  10 mg Intravenous Once  . metoCLOPramide (REGLAN) injection  5 mg Intravenous Q6H  . metoprolol succinate  50 mg Oral Daily  . pantoprazole (PROTONIX) IV  40 mg Intravenous Q24H  . promethazine  12.5 mg Intravenous Once  . promethazine  25 mg Intramuscular Once  . senna  1 tablet  Oral BID  . sodium chloride  1,000 mL Intravenous Once  . sodium chloride  1,000 mL Intravenous Once   Continuous Infusions:    . sodium chloride 75 mL/hr at 12/01/11 0858  . DISCONTD: sodium chloride 125 mL/hr at 12/01/11 0053      LOS: 1 day   Jacquelynn Cree, MD Pager [phone]  12/01/2011, 11:23 AM   Patient Teaching (information given to and reviewed with the patient): [name] 12/01/2011  Treatment team:   Dr. Margreta Journey Ramona Ruark, Hospitalist (Internist); Dr. Percell Belt will take over your care 12/02/11.  Medical Issues and plan: Principal Problem:  *Acute on chronic Abdominal pain with nausea and vomiting secondary to ? chronic pancreatitis, gastroparesis, and probable cyclic vomiting syndrome  This can be exacerbated by treatment with pain medications (narcotics) which cause problems with slowing down bowel motility and these will need to be tapered off.  Treat nausea with antinausea medicines.  Continue Reglan for sluggish stomach  emptying (gastroparesis) to increase bowel motility.  Lipase levels, which are a measure of pancreatic dysfunction, are within normal limits.  No pancreatic calcifications noted on imaging studies so the diagnosis of chronic pancreatitis is in question. Calcifications are often seen in the pancreas when chronic pancreatitis is present.  X-rays done on admission showed constipation which may be exacerbating symptoms, again a consequence of chronic pain medication use.  On twice a day Senokot. Will add daily MiraLAX. Active Problems:  Sluggish emptying of the stomach (Gastroparesis) with nausea and vomiting  This is diagnosed with a gastric emptying study. I do not see that this study has been done here.  Continue Reglan.  Change diet to clear liquids since you are continuing to have significant abdominal symptoms with nausea and vomiting as well as abdominal pain..  High blood pressure  Currently on lisinopril and  metoprolol  Blood pressure controlled.  Questionable pancreatic inflammation  No pancreatic calcifications or inflammation noted on any imaging studies.  Lipase levels consistently within normal limits. This is a measure of pancreatic dysfunction.  Unclear diagnosis. High cholesterol  Currently on Lipitor.  Reflux  Currently on Protonix. Uncontrolled diabetes  Hemoglobin A1c 9.7% (goal less than 7%) which corresponds to a mean plasma glucose of 268.  Needs better glycemic control.  Consult diabetes coordinator for further patient education. Low potassium  Likely secondary to nausea and vomiting. Replace in IV fluids.  Constipation  Continue Senokot.  Add MiraLAX   Anticipated discharge date: 1-2 days

## 2011-12-01 NOTE — ED Notes (Signed)
Attempted to call report - RN to return call.  

## 2011-12-01 NOTE — Progress Notes (Signed)
Inpatient Diabetes Program Recommendations  AACE/ADA: New Consensus Statement on Inpatient Glycemic Control (2009)  Target Ranges:  Prepandial:   less than 140 mg/dL      Peak postprandial:   less than 180 mg/dL (1-2 hours)      Critically ill patients:  140 - 180 mg/dL   Reason for Visit: Consult for DM Type 1  41 year old woman with a history of gastroparesis and pancreatitis presented emergency department with a two-day history of abdominal pain, nausea and vomiting. Pain began last night and current intensity 7/10. She has run out of oral Dilaudid that she was prescribed previously in the emergency department. No specific aggravating or alleviating factors described. Pain is located in the epigastric and left upper quadrant areas. Associated with vomiting. Reports large bowel movement recently. Pt states her blood sugars run high most of the time.  Monitors 3 - 4 times/day.  States she doesn't eat very much d/t gastroparesis and has lost about 10 pounds over past few months. Said last PCP appt was approx 6 months ago and he adjusted insulin to Lantus 20 units QHS and Humalog S/S.  Pt states she needs new meter.  Gave info regarding [employer] meter and strips at [employer].  Pt will need prescription for meter and supplies prior to discharge.  Also discussed importance of taking insulin even if she is not eating.  Will give Sick Day Rules educational sheet.     Results for SUJEI, RYLE (MRN [id number]) as of 12/01/2011 15:12  Ref. Range 12/01/2011 04:15  Sodium Latest Range: 135-145 mEq/L 138  Potassium Latest Range: 3.5-5.1 mEq/L 3.0 (L)  Chloride Latest Range: 96-112 mEq/L 100  CO2 Latest Range: 19-32 mEq/L 28  Mean Plasma Glucose Latest Range: <117 mg/dL 232 (H)  BUN Latest Range: 6-23 mg/dL 12  Creat Latest Range: 0.50-1.10 mg/dL 0.41 (L)  Calcium Latest Range: 8.4-10.5 mg/dL 8.6  GFR calc non Af Amer Latest Range: >90 mL/min >90  GFR calc Af Amer Latest Range: >90 mL/min >90  Glucose Latest  Range: 70-99 mg/dL 138 (H)  Hemoglobin A1C Latest Range: <5.7 % 9.7 (H)    Results for EMELI, STECKEL (MRN [id number]) as of 12/01/2011 15:12  Ref. Range 11/30/2011 15:44 12/01/2011 00:27 12/01/2011 08:18 12/01/2011 11:48  Glucose-Capillary Latest Range: 70-99 mg/dL 259 (H) 154 (H) 140 (H) 121 (H)  Results for LACHANDA, PEABODY (MRN [id number]) as of 12/01/2011 15:12  Ref. Range 12/01/2011 04:15  Hemoglobin A1C Latest Range: <5.7 % 9.7 (H)    Inpatient Diabetes Program Recommendations Insulin - Basal: Pt states she is on Lantus 20 units QHS at home  Insulin - Meal Coverage: When po intake increases, add meal coverage insulin - Novolog 3 units tidwc HgbA1C: 9.7% - uncontrolled Outpatient Referral: OP Diabetes Education consult for uncontrolled DM Diet: When advanced, CHO mod med  Note: Encouraged pt to have more frequent f/u with PCP to manage diabetes.  Will follow.

## 2011-12-01 NOTE — Progress Notes (Addendum)
INITIAL ADULT NUTRITION ASSESSMENT Date: 12/01/2011   Time: 10:57 AM Reason for Assessment: Nutrition risk   ASSESSMENT: [name]  Dx: Abdominal pain  Food/Nutrition Related Hx: Pt admitted with abdominal pain. Pt seen by ED 4 times earlier this month for this symptom in addition to nausea and vomiting. When asked how long this has been going on she said "for a long time". Pt states her weight has been up and down recently. Pt reports PTA she was vomiting between 6-10 times/day. Pt states when she could eat she would choose things like salad or grilled cheese, however there were days when she would go a whole day without eating. Pt states she tries to follow a diabetic diet at home and is aware of sources of carbohydrates/portion sizes. Pt reports trouble chewing solid foods on/off since January 2013 and sometimes feels like food gets stuck in her throat. Pt states sometimes she gets choked on her saliva. Recommend SLP evaluation to help with this report of dysphagia. Pt still c/o of nausea however denies any vomiting today other than a little spit up this morning. Pt has not eaten anything so far today, states she is not ready for solid foods, only desires liquids. Pt reports having BM today. DG of abdomen yesterday showed no evidence of bowel obstruction.   Hx:  Past Medical History  Diagnosis Date  . Hypertension   . Diabetes mellitus   . Gastroparesis   . Asthma   . GERD (gastroesophageal reflux disease)   . Coronary artery disease   . h/o seizure   . Neuropathy   . Chronic pancreatitis   . Dyslipidemia   . MI (myocardial infarction)   . COPD (chronic obstructive pulmonary disease)    Related Meds:  Scheduled Meds:   . sodium chloride   Intravenous STAT  . aspirin EC  81 mg Oral Daily  . atorvastatin  40 mg Oral Daily  . docusate sodium  100 mg Oral BID  . Fluticasone-Salmeterol  1 puff Inhalation BID  . gabapentin  600 mg Oral TID  .  HYDROmorphone (DILAUDID) injection   1 mg Intravenous Once  .  HYDROmorphone (DILAUDID) injection  1 mg Intravenous Once  .  HYDROmorphone (DILAUDID) injection  1 mg Intravenous Once  .  HYDROmorphone (DILAUDID) injection  1 mg Intravenous Once  .  HYDROmorphone (DILAUDID) injection  2 mg Intramuscular Once  . insulin aspart  0-9 Units Subcutaneous TID WC  . insulin glargine  10 Units Subcutaneous QHS  . lisinopril  20 mg Oral Daily  . metoCLOPramide (REGLAN) injection  10 mg Intravenous Once  . metoCLOPramide (REGLAN) injection  5 mg Intravenous Q6H  . metoprolol succinate  50 mg Oral Daily  . pantoprazole (PROTONIX) IV  40 mg Intravenous Q24H  . promethazine  12.5 mg Intravenous Once  . promethazine  25 mg Intramuscular Once  . senna  1 tablet Oral BID  . sodium chloride  1,000 mL Intravenous Once  . sodium chloride  1,000 mL Intravenous Once   Continuous Infusions:   . sodium chloride 75 mL/hr at 12/01/11 0858  . DISCONTD: sodium chloride 125 mL/hr at 12/01/11 0053   PRN Meds:.bisacodyl, HYDROmorphone, HYDROmorphone (DILAUDID) injection, promethazine, sodium phosphate, zolpidem  Ht: 5\' 5"  (165.1 cm)  Wt: 139 lb 12.4 oz (63.4 kg)  Ideal Wt: 125 lb % Ideal Wt: 111  Usual Wt: 127 lb % Usual Wt: 109  Body mass index is 23.26 kg/(m^2).   Labs:  CMP  Component Value Date/Time   NA 138 12/01/2011 0415   K 3.0* 12/01/2011 0415   CL 100 12/01/2011 0415   CO2 28 12/01/2011 0415   GLUCOSE 138* 12/01/2011 0415   BUN 12 12/01/2011 0415   CREATININE 0.41* 12/01/2011 0415   CALCIUM 8.6 12/01/2011 0415   PROT 8.1 11/30/2011 1542   ALBUMIN 3.4* 11/30/2011 1542   AST 26 11/30/2011 1542   ALT 12 11/30/2011 1542   ALKPHOS 108 11/30/2011 1542   BILITOT 0.2* 11/30/2011 1542   GFRNONAA >90 12/01/2011 0415   GFRAA >90 12/01/2011 0415   Lab Results  Component Value Date   HGBA1C 9.7* 12/01/2011   CBG (last 3)   Basename 12/01/11 1148 12/01/11 0818 12/01/11 0027  GLUCAP 121* 140* 154*    Intake/Output Summary (Last 24 hours) at 12/01/11  1210 Last data filed at 12/01/11 [id number]  Gross per 24 hour  Intake      0 ml  Output    300 ml  Net   -300 ml   Last BM - today per pt report  Diet Order: Carb Control   IVF:    sodium chloride Last Rate: 75 mL/hr at 12/01/11 [id number]  DISCONTD: sodium chloride Last Rate: 125 mL/hr at 12/01/11 0053    Estimated Nutritional Needs:   [id number] Protein:65-75g Fluid:1.6-1.8L  NUTRITION DIAGNOSIS: -Inadequate oral intake (NI-2.1).  Status: Ongoing  RELATED TO: abdominal pain, nausea, vomiting  AS EVIDENCE BY: pt statement  MONITORING/EVALUATION(Goals): 1. Resolution of nausea/vomiting 2. Pt to consume >50% of meals.   EDUCATION NEEDS: -Education needs addressed - discussed recommended for gastroparesis and pancreatitis  INTERVENTION: Anti-emetics per MD - pt may benefit from scheduled versus PRN. Recommend SLP evaluation. Will monitor.   Dietitian #: [phone]  [place] Per approved criteria  -Not Applicable    Glory Rosebush 12/01/2011, 10:57 AM

## 2011-12-02 DIAGNOSIS — R1084 Generalized abdominal pain: Secondary | ICD-10-CM

## 2011-12-02 DIAGNOSIS — E1165 Type 2 diabetes mellitus with hyperglycemia: Secondary | ICD-10-CM

## 2011-12-02 DIAGNOSIS — R112 Nausea with vomiting, unspecified: Secondary | ICD-10-CM

## 2011-12-02 DIAGNOSIS — K5900 Constipation, unspecified: Secondary | ICD-10-CM

## 2011-12-02 LAB — GLUCOSE, CAPILLARY

## 2011-12-02 MED ORDER — METOPROLOL SUCCINATE ER 50 MG PO TB24: 50.0000 mg | ORAL_TABLET | Freq: Every day | ORAL | Status: DC

## 2011-12-02 MED ORDER — BISACODYL 10 MG RE SUPP: 10.0000 mg | Freq: Every day | RECTAL | Status: AC | PRN

## 2011-12-02 MED ORDER — METOCLOPRAMIDE HCL 10 MG PO TABS: 10.0000 mg | ORAL_TABLET | Freq: Four times a day (QID) | ORAL | Status: DC

## 2011-12-02 MED ORDER — INSULIN GLARGINE 100 UNIT/ML ~~LOC~~ SOLN: 10.0000 [IU] | Freq: Every day | SUBCUTANEOUS | Status: DC

## 2011-12-02 MED ORDER — ADULT MULTIVITAMIN W/MINERALS CH: 1.0000 | ORAL_TABLET | Freq: Every day | ORAL | Status: DC

## 2011-12-02 MED ORDER — FLUTICASONE-SALMETEROL 250-50 MCG/DOSE IN AEPB: 1.0000 | INHALATION_SPRAY | Freq: Two times a day (BID) | RESPIRATORY_TRACT | Status: DC

## 2011-12-02 MED ORDER — ASPIRIN 81 MG PO TBEC: 81.0000 mg | DELAYED_RELEASE_TABLET | Freq: Every day | ORAL | Status: AC

## 2011-12-02 MED ORDER — INSULIN LISPRO 100 UNIT/ML ~~LOC~~ SOLN: 10.0000 [IU] | Freq: Three times a day (TID) | SUBCUTANEOUS | Status: DC

## 2011-12-02 MED ORDER — ATORVASTATIN CALCIUM 40 MG PO TABS: 40.0000 mg | ORAL_TABLET | Freq: Every day | ORAL | Status: DC

## 2011-12-02 MED ORDER — ESOMEPRAZOLE MAGNESIUM 40 MG PO CPDR: 40.0000 mg | DELAYED_RELEASE_CAPSULE | Freq: Every day | ORAL | Status: DC

## 2011-12-02 MED ORDER — ZOLPIDEM TARTRATE 5 MG PO TABS: 5.0000 mg | ORAL_TABLET | Freq: Every evening | ORAL | Status: DC | PRN

## 2011-12-02 MED ORDER — LISINOPRIL 10 MG PO TABS: 20.0000 mg | ORAL_TABLET | Freq: Every day | ORAL | Status: DC

## 2011-12-02 MED ORDER — DSS 100 MG PO CAPS: 100.0000 mg | ORAL_CAPSULE | Freq: Two times a day (BID) | ORAL | Status: AC

## 2011-12-02 MED ORDER — HYDROMORPHONE HCL PF 1 MG/ML IJ SOLN
1.0000 mg | INTRAMUSCULAR | Status: DC | PRN
  Administered 2011-12-02 (×2): 1 mg via INTRAVENOUS
  Filled 2011-12-02 (×2): qty 1

## 2011-12-02 MED ORDER — PROMETHAZINE HCL 25 MG RE SUPP: 25.0000 mg | Freq: Four times a day (QID) | RECTAL | Status: DC | PRN

## 2011-12-02 MED ORDER — GABAPENTIN 600 MG PO TABS: 600.0000 mg | ORAL_TABLET | Freq: Three times a day (TID) | ORAL | Status: DC

## 2011-12-02 MED ORDER — POTASSIUM CHLORIDE CRYS ER 20 MEQ PO TBCR
40.0000 meq | EXTENDED_RELEASE_TABLET | Freq: Once | ORAL | Status: AC
  Administered 2011-12-02: 40 meq via ORAL
  Filled 2011-12-02: qty 2

## 2011-12-02 MED ORDER — PANTOPRAZOLE SODIUM 20 MG PO TBEC: 20.0000 mg | DELAYED_RELEASE_TABLET | Freq: Every day | ORAL | Status: DC

## 2011-12-02 NOTE — Discharge Summary (Signed)
 Patient ID: [name] MRN: [id number] DOB/AGE: 08-Jun-1971 41 y.o.  Admit date: 11/30/2011 Discharge date: 12/02/2011  Primary Care Physician:  [name], MD, MD  Assessment/Plan:   Principal Problem:   *Acute on chronic Abdominal pain with nausea and vomiting secondary to chronic pancreatitis, gastroparesis, and probable cyclic vomiting syndrome  Likely multifactorial. Doubt chronic pancreatitis but has a diagnosis of gastroparesis, is constipated and possibly is having intermittent ketosis related to uncontrolled diabetes which all may be contributory. Narcotic use likely exacerbating symptoms. Encouraged patient to use these sparingly.  Treat nausea with antinausea medicines.  Continue Reglan for gastroparesis to increase bowel motility.  Lipase levels within normal limits.  No pancreatic calcifications noted on imaging studies so the diagnosis of chronic pancreatitis is in question.  X-rays done on admission showed constipation which may be exacerbating symptoms, again a consequence of chronic narcotic use.   Active Problems:   Gastroparesis with nausea and vomiting  No nuclear medicine study in hospital records confirming this diagnosis. Patient states that this was diagnosed at another facility.  Continue Reglan.  Patient insists on leaving today and reports she will advance diet as she tolerates it at home. At this time she tolerates liquid very well  HTN (hypertension)  Currently on lisinopril and metoprolol  Blood pressure controlled.  Chronic pancreatitis  No pancreatic calcifications noted on any imaging studies.  Lipase levels consistently within normal limits.  Unclear diagnosis. States that this diagnosis was made after she had her gallbladder removed. This was done at outside facility.  Dyslipidemia  Currently on Lipitor.  GERD (gastroesophageal reflux disease)  Currently on Protonix.  DM (diabetes mellitus), type 2, uncontrolled  Hemoglobin A1c 9.7% (goal  less than 7%) which corresponds to a mean plasma glucose of 268.  Needs better glycemic control.  Consulted diabetes coordinator  Hypokalemia  Likely secondary to nausea and vomiting. Replaced in IV fluids. Potassium chloride 40 meq x 1 dose ordered today prior to discharge with instruction to follow up with PCP in 1 week and re evaluate BMP  Constipation  Continue Senokot.  Added MiraLAX  Code Status: Full   Family Communication: None at bedside.   Disposition Plan: Home   Medical Consultants:  None  Other consultants:  Diabetes coordinator  Antibiotics:  None  Medication List  As of 12/02/2011  3:11 PM   TAKE these medications         aspirin 81 MG EC tablet   Take 1 tablet (81 mg total) by mouth daily.      atorvastatin 40 MG tablet   Commonly known as: LIPITOR   Take 1 tablet (40 mg total) by mouth daily.      bisacodyl 10 MG suppository   Commonly known as: DULCOLAX   Place 1 suppository (10 mg total) rectally daily as needed for constipation.      DSS 100 MG Caps   Take 100 mg by mouth 2 (two) times daily.      esomeprazole 40 MG capsule   Commonly known as: NEXIUM   Take 1 capsule (40 mg total) by mouth daily before breakfast.      Fluticasone-Salmeterol 250-50 MCG/DOSE Aepb   Commonly known as: ADVAIR   Inhale 1 puff into the lungs 2 (two) times daily.      gabapentin 600 MG tablet   Commonly known as: NEURONTIN   Take 1 tablet (600 mg total) by mouth 3 (three) times daily.      insulin glargine 100 UNIT/ML injection  Commonly known as: LANTUS   Inject 10 Units into the skin at bedtime.      insulin lispro 100 UNIT/ML injection   Commonly known as: HUMALOG   Inject 10-20 Units into the skin 3 (three) times daily before meals. Sliding scale      lisinopril 10 MG tablet   Commonly known as: PRINIVIL,ZESTRIL   Take 2 tablets (20 mg total) by mouth daily.      metoCLOPramide 10 MG tablet   Commonly known as: REGLAN   Take 1 tablet (10 mg total)  by mouth every 6 (six) hours.      metoCLOPramide 10 MG tablet   Commonly known as: REGLAN   Take 1 tablet (10 mg total) by mouth every 6 (six) hours.      metoprolol succinate 50 MG 24 hr tablet   Commonly known as: TOPROL-XL   Take 1 tablet (50 mg total) by mouth daily.      mulitivitamin with minerals Tabs   Take 1 tablet by mouth daily.      pantoprazole 20 MG tablet   Commonly known as: PROTONIX   Take 1 tablet (20 mg total) by mouth daily.      promethazine 25 MG suppository   Commonly known as: PHENERGAN   Place 1 suppository (25 mg total) rectally every 6 (six) hours as needed. For nausea/vomiting.      zolpidem 5 MG tablet   Commonly known as: AMBIEN   Take 1 tablet (5 mg total) by mouth at bedtime as needed for sleep (insomnia).            Disposition and Follow-up:  - patient is medically stable at the time of discharge - please note that the potassium was repleted before the discharge - patient will follow up with Dr. Jonelle Sidle in 1 week from discharge  Significant Diagnostic Studies:  Dg Abd Acute W/chest 11/30/2011  *RADIOLOGY REPORT*  Clinical Data: Vomiting, gastroparesis  ACUTE ABDOMEN SERIES (ABDOMEN 2 VIEW & CHEST 1 VIEW)  Comparison: Plain film 11/24/2011  Findings: There are bowel surgical clips in the left abdomen.  No dilated loops of large or small bowel.  There is stool throughout the colon and rectum.  Lung bases are clear.  No free air in the diaphragm.  Heart is normal.  No pathologic calcifications.  Prior cholecystectomy.  IMPRESSION:  1.  Lungs are clear.  2.  No evidence intraperitoneal free air or bowel obstruction. 3.  Moderate volume stool throughout the colon suggest constipation.  Original Report Authenticated By: Suzy Bouchard, M.D.    Brief H and P: 41 year old female with a past medical history of gastroparesis and chronic pancreatitis who presented to the hospital on 11/30/2011 with a chief complaint of abdominal pain accompanied by nausea  and vomiting. She was admitted for symptom control.   Physical Exam on Discharge:  Filed Vitals:   12/01/11 2004 12/01/11 2049 12/02/11 0546 12/02/11 1019  BP:  159/99 124/81   Pulse:  77 85   Temp:  98.3 F (36.8 C) 98.7 F (37.1 C)   TempSrc:  Oral Oral   Resp:  18 18   Height:      Weight:      SpO2: 99% 100% 99% 98%     Intake/Output Summary (Last 24 hours) at 12/02/11 1511 Last data filed at 12/02/11 1300  Gross per 24 hour  Intake   1677 ml  Output    300 ml  Net   1377 ml  General: Alert, awake, oriented x3, in no acute distress. HEENT: No bruits, no goiter. Heart: Regular rate and rhythm, without murmurs, rubs, gallops. Lungs: Clear to auscultation bilaterally. Abdomen: Soft, nontender, nondistended, positive bowel sounds. Extremities: No clubbing cyanosis or edema with positive pedal pulses. Neuro: Grossly intact, nonfocal.  CBC:    Component Value Date/Time   WBC 8.8 11/30/2011 1542   HGB 15.0 11/30/2011 1542   HCT 42.9 11/30/2011 1542   PLT 228 11/30/2011 1542   MCV 87.4 11/30/2011 1542   NEUTROABS 5.0 11/30/2011 1542   LYMPHSABS 3.2 11/30/2011 1542   MONOABS 0.5 11/30/2011 1542   EOSABS 0.1 11/30/2011 1542   BASOSABS 0.0 11/30/2011 [id number]    Basic Metabolic Panel:    Component Value Date/Time   NA 138 12/01/2011 0415   K 3.0* 12/01/2011 0415   CL 100 12/01/2011 0415   CO2 28 12/01/2011 0415   BUN 12 12/01/2011 0415   CREATININE 0.41* 12/01/2011 0415   GLUCOSE 138* 12/01/2011 0415   CALCIUM 8.6 12/01/2011 0415    Time spent on Discharge: Greater than 30 minutes  Signed: ,  12/02/2011, 3:11 PM

## 2011-12-02 NOTE — Progress Notes (Signed)
DC to home. To car by wc. No change from am assessment.[name]

## 2011-12-02 NOTE — Discharge Instructions (Signed)
Abdominal Pain Abdominal pain can be caused by many things. Your caregiver decides the seriousness of your pain by an examination and possibly blood tests and X-rays. Many cases can be observed and treated at home. Most abdominal pain is not caused by a disease and will probably improve without treatment. However, in many cases, more time must pass before a clear cause of the pain can be found. Before that point, it may not be known if you need more testing, or if hospitalization or surgery is needed. HOME CARE INSTRUCTIONS   Do not take laxatives unless directed by your caregiver.   Take pain medicine only as directed by your caregiver.   Only take over-the-counter or prescription medicines for pain, discomfort, or fever as directed by your caregiver.   Try a clear liquid diet (broth, tea, or water) for as long as directed by your caregiver. Slowly move to a bland diet as tolerated.  SEEK IMMEDIATE MEDICAL CARE IF:   The pain does not go away.   You have a fever.   You keep throwing up (vomiting).   The pain is felt only in portions of the abdomen. Pain in the right side could possibly be appendicitis. In an adult, pain in the left lower portion of the abdomen could be colitis or diverticulitis.   You pass bloody or black tarry stools.  MAKE SURE YOU:   Understand these instructions.   Will watch your condition.   Will get help right away if you are not doing well or get worse.  Document Released: 04/27/2005 Document Revised: 07/07/2011 Document Reviewed: 03/05/2008 [employer] Patient Information 2012 [employer]. 

## 2011-12-20 ENCOUNTER — Emergency Department (HOSPITAL_COMMUNITY)
Admission: EM | Admit: 2011-12-20 | Discharge: 2011-12-20 | Discharge status: Against Medical Advice | Payer: PRIVATE HEALTH INSURANCE | Attending: Emergency Medicine | Admitting: Emergency Medicine

## 2011-12-20 DIAGNOSIS — Z0389 Encounter for observation for other suspected diseases and conditions ruled out: Secondary | ICD-10-CM | POA: Insufficient documentation

## 2011-12-20 NOTE — ED Notes (Signed)
Called no answer

## 2011-12-20 NOTE — ED Notes (Signed)
Pt called no answer 

## 2011-12-20 NOTE — ED Notes (Signed)
Hx of gastropareisis and pancreatitis. Abd pain for 4 days. Hypertensive. Unable to keep medications down.

## 2012-01-12 ENCOUNTER — Emergency Department (HOSPITAL_COMMUNITY): Payer: PRIVATE HEALTH INSURANCE

## 2012-01-12 ENCOUNTER — Emergency Department (HOSPITAL_COMMUNITY)
Admission: EM | Admit: 2012-01-12 | Discharge: 2012-01-12 | Disposition: A | Discharge status: Home / Self Care | Payer: PRIVATE HEALTH INSURANCE | Attending: Emergency Medicine | Admitting: Emergency Medicine

## 2012-01-12 ENCOUNTER — Encounter (HOSPITAL_COMMUNITY): Payer: Self-pay | Admitting: Emergency Medicine

## 2012-01-12 DIAGNOSIS — E876 Hypokalemia: Secondary | ICD-10-CM | POA: Insufficient documentation

## 2012-01-12 DIAGNOSIS — K3184 Gastroparesis: Secondary | ICD-10-CM | POA: Insufficient documentation

## 2012-01-12 DIAGNOSIS — Z7982 Long term (current) use of aspirin: Secondary | ICD-10-CM | POA: Insufficient documentation

## 2012-01-12 DIAGNOSIS — K861 Other chronic pancreatitis: Secondary | ICD-10-CM | POA: Insufficient documentation

## 2012-01-12 DIAGNOSIS — K219 Gastro-esophageal reflux disease without esophagitis: Secondary | ICD-10-CM | POA: Insufficient documentation

## 2012-01-12 DIAGNOSIS — R1110 Vomiting, unspecified: Secondary | ICD-10-CM

## 2012-01-12 DIAGNOSIS — Z79899 Other long term (current) drug therapy: Secondary | ICD-10-CM | POA: Insufficient documentation

## 2012-01-12 DIAGNOSIS — E119 Type 2 diabetes mellitus without complications: Secondary | ICD-10-CM | POA: Insufficient documentation

## 2012-01-12 DIAGNOSIS — J4489 Other specified chronic obstructive pulmonary disease: Secondary | ICD-10-CM | POA: Insufficient documentation

## 2012-01-12 DIAGNOSIS — G589 Mononeuropathy, unspecified: Secondary | ICD-10-CM | POA: Insufficient documentation

## 2012-01-12 DIAGNOSIS — R112 Nausea with vomiting, unspecified: Secondary | ICD-10-CM | POA: Insufficient documentation

## 2012-01-12 DIAGNOSIS — J449 Chronic obstructive pulmonary disease, unspecified: Secondary | ICD-10-CM | POA: Insufficient documentation

## 2012-01-12 DIAGNOSIS — Z794 Long term (current) use of insulin: Secondary | ICD-10-CM | POA: Insufficient documentation

## 2012-01-12 DIAGNOSIS — I2510 Atherosclerotic heart disease of native coronary artery without angina pectoris: Secondary | ICD-10-CM | POA: Insufficient documentation

## 2012-01-12 DIAGNOSIS — I252 Old myocardial infarction: Secondary | ICD-10-CM | POA: Insufficient documentation

## 2012-01-12 DIAGNOSIS — F17200 Nicotine dependence, unspecified, uncomplicated: Secondary | ICD-10-CM | POA: Insufficient documentation

## 2012-01-12 LAB — HEPATIC FUNCTION PANEL
ALT: 11 U/L (ref 0–35)
AST: 16 U/L (ref 0–37)
Albumin: 3.2 g/dL — ABNORMAL LOW (ref 3.5–5.2)
Bilirubin, Direct: <0.1 mg/dL (ref 0.0–0.3)

## 2012-01-12 LAB — POCT I-STAT, CHEM 8
BUN: 16 mg/dL (ref 6–23)
BUN: 16 mg/dL (ref 6–23)
Chloride: 97 mEq/L (ref 96–112)
Chloride: 98 mEq/L (ref 96–112)
Creatinine, Ser: 0.6 mg/dL (ref 0.50–1.10)
Creatinine, Ser: 0.6 mg/dL (ref 0.50–1.10)
Glucose, Bld: 330 mg/dL — ABNORMAL HIGH (ref 70–99)
Potassium: 2.9 mEq/L — ABNORMAL LOW (ref 3.5–5.1)
Potassium: 3 mEq/L — ABNORMAL LOW (ref 3.5–5.1)
Sodium: 137 mEq/L (ref 135–145)

## 2012-01-12 LAB — DIFFERENTIAL
Basophils Absolute: 0 10*3/uL (ref 0.0–0.1)
Lymphocytes Relative: 19 % (ref 12–46)
Monocytes Absolute: 0.3 10*3/uL (ref 0.1–1.0)
Monocytes Relative: 3 % (ref 3–12)
Neutro Abs: 8.7 10*3/uL — ABNORMAL HIGH (ref 1.7–7.7)
Neutrophils Relative %: 78 % — ABNORMAL HIGH (ref 43–77)

## 2012-01-12 LAB — CBC
HCT: 41.8 % (ref 36.0–46.0)
Hemoglobin: 14.5 g/dL (ref 12.0–15.0)
RDW: 13 % (ref 11.5–15.5)
WBC: 11.1 10*3/uL — ABNORMAL HIGH (ref 4.0–10.5)

## 2012-01-12 MED ORDER — PROMETHAZINE HCL 25 MG/ML IJ SOLN
25.0000 mg | Freq: Once | INTRAMUSCULAR | Status: AC
  Administered 2012-01-12: 25 mg via INTRAVENOUS
  Filled 2012-01-12: qty 1

## 2012-01-12 MED ORDER — FENTANYL CITRATE 0.05 MG/ML IJ SOLN
50.0000 ug | Freq: Once | INTRAMUSCULAR | Status: AC
  Administered 2012-01-12: 50 ug via INTRAVENOUS
  Filled 2012-01-12: qty 2

## 2012-01-12 MED ORDER — SODIUM CHLORIDE 0.9 % IV BOLUS (SEPSIS)
1000.0000 mL | Freq: Once | INTRAVENOUS | Status: AC
  Administered 2012-01-12: 1000 mL via INTRAVENOUS

## 2012-01-12 MED ORDER — DIPHENHYDRAMINE HCL 50 MG/ML IJ SOLN
12.5000 mg | Freq: Once | INTRAMUSCULAR | Status: AC
  Administered 2012-01-12: 12.5 mg via INTRAVENOUS
  Filled 2012-01-12: qty 1

## 2012-01-12 MED ORDER — METOCLOPRAMIDE HCL 5 MG/ML IJ SOLN
10.0000 mg | Freq: Once | INTRAMUSCULAR | Status: AC
  Administered 2012-01-12: 10 mg via INTRAVENOUS
  Filled 2012-01-12: qty 2

## 2012-01-12 MED ORDER — LORAZEPAM 2 MG/ML IJ SOLN
1.0000 mg | Freq: Once | INTRAMUSCULAR | Status: AC
  Administered 2012-01-12: 1 mg via INTRAVENOUS
  Filled 2012-01-12: qty 1

## 2012-01-12 MED ORDER — FENTANYL CITRATE 0.05 MG/ML IJ SOLN
50.0000 ug | Freq: Once | INTRAMUSCULAR | Status: AC
  Administered 2012-01-12: 50 ug via INTRAVENOUS
  Filled 2012-01-12 (×2): qty 2

## 2012-01-12 MED ORDER — POTASSIUM CHLORIDE 10 MEQ/100ML IV SOLN
10.0000 meq | INTRAVENOUS | Status: AC
  Administered 2012-01-12 (×4): 10 meq via INTRAVENOUS
  Filled 2012-01-12 (×4): qty 100

## 2012-01-12 MED ORDER — POTASSIUM CHLORIDE CRYS ER 20 MEQ PO TBCR
40.0000 meq | EXTENDED_RELEASE_TABLET | Freq: Once | ORAL | Status: AC
  Administered 2012-01-12: 40 meq via ORAL
  Filled 2012-01-12: qty 2

## 2012-01-12 MED ORDER — LORAZEPAM 2 MG/ML IJ SOLN: 1.0000 mg | Freq: Once | INTRAMUSCULAR | Status: DC

## 2012-01-12 MED ORDER — PROMETHAZINE HCL 25 MG RE SUPP: 25.0000 mg | Freq: Four times a day (QID) | RECTAL | Status: AC | PRN

## 2012-01-12 NOTE — ED Notes (Signed)
Potassium continues to infuse.

## 2012-01-12 NOTE — ED Notes (Signed)
Pt on stretcher, nad noted, abc intact, pt denies needs, awaiting on potassium to infuse then to be discharged.

## 2012-01-12 NOTE — ED Provider Notes (Signed)
History     CSN: [id number]  Arrival date & time 01/12/12  0005   First MD Initiated Contact with Patient 01/12/12 0028      Chief Complaint  Patient presents with  . Emesis    (Consider location/radiation/quality/duration/timing/severity/associated sxs/prior treatment) Patient is a 41 y.o. female presenting with vomiting. The history is provided by the patient. No language interpreter was used.  Emesis  This is a recurrent problem. The current episode started 2 days ago. The problem occurs 5 to 10 times per day. The problem has not changed since onset.The emesis has an appearance of stomach contents. There has been no fever. Associated symptoms include abdominal pain. Pertinent negatives include no arthralgias, no chills, no cough, no diarrhea, no fever, no myalgias, no sweats and no URI. Risk factors: poorly controlled diabetes and known gastroparesis.    Past Medical History  Diagnosis Date  . Hypertension   . Diabetes mellitus   . Gastroparesis   . Asthma   . GERD (gastroesophageal reflux disease)   . Coronary artery disease   . h/o seizure   . Neuropathy   . Chronic pancreatitis   . Dyslipidemia   . MI (myocardial infarction)   . COPD (chronic obstructive pulmonary disease)     Past Surgical History  Procedure Date  . Abdominal hysterectomy   . Back surgery   . Cholecystectomy   . Peg tube x 4   . Cesarean section     Family History  Problem Relation Age of Onset  . Cancer Mother     History  Substance Use Topics  . Smoking status: Current Everyday Smoker -- 0.2 packs/day for 2 years    Types: Cigarettes  . Smokeless tobacco: Never Used  . Alcohol Use: No     hx 1 beer daily -pt states she quit Dec. 2012    OB History    Grav Para Term Preterm Abortions TAB SAB Ect Mult Living                  Review of Systems  Constitutional: Negative for fever and chills.  Respiratory: Negative for cough.   Cardiovascular: Negative for chest pain and  palpitations.  Gastrointestinal: Positive for nausea, vomiting and abdominal pain. Negative for diarrhea.  Musculoskeletal: Negative for myalgias and arthralgias.  All other systems reviewed and are negative.    Allergies  Compazine; Darvocet; Nsaids; Penicillins; and Zofran  Home Medications   Current Outpatient Rx  Name Route Sig Dispense Refill  . ASPIRIN 81 MG PO TBEC Oral Take 1 tablet (81 mg total) by mouth daily. 30 tablet 0  . ATORVASTATIN CALCIUM 40 MG PO TABS Oral Take 1 tablet (40 mg total) by mouth daily. 30 tablet 0  . ESOMEPRAZOLE MAGNESIUM 40 MG PO CPDR Oral Take 1 capsule (40 mg total) by mouth daily before breakfast. 30 capsule 0  . FLUTICASONE-SALMETEROL 250-50 MCG/DOSE IN AEPB Inhalation Inhale 1 puff into the lungs 2 (two) times daily. 60 each 0  . GABAPENTIN 600 MG PO TABS Oral Take 1 tablet (600 mg total) by mouth 3 (three) times daily. 90 tablet 0  . INSULIN GLARGINE 100 UNIT/ML [place] SOLN Subcutaneous Inject 10 Units into the skin at bedtime. 3 mL 4    Please give pen  . INSULIN LISPRO (HUMAN) 100 UNIT/ML [place] SOLN Subcutaneous Inject 10-20 Units into the skin 3 (three) times daily before meals. Sliding scale 10 mL 2  . LISINOPRIL 10 MG PO TABS Oral Take 2 tablets (  20 mg total) by mouth daily. 60 tablet 0  . METOCLOPRAMIDE HCL 10 MG PO TABS Oral Take 1 tablet (10 mg total) by mouth every 6 (six) hours. 30 tablet 0  . METOCLOPRAMIDE HCL 10 MG PO TABS Oral Take 1 tablet (10 mg total) by mouth every 6 (six) hours. 30 tablet 0  . METOCLOPRAMIDE HCL 10 MG PO TABS Oral Take 1 tablet (10 mg total) by mouth every 6 (six) hours. 30 tablet 0  . METOPROLOL SUCCINATE ER 50 MG PO TB24 Oral Take 1 tablet (50 mg total) by mouth daily. 30 tablet 0  . ADULT MULTIVITAMIN W/MINERALS CH Oral Take 1 tablet by mouth daily. 30 tablet 0  . PANTOPRAZOLE SODIUM 20 MG PO TBEC Oral Take 1 tablet (20 mg total) by mouth daily. 30 tablet 0  . PROMETHAZINE HCL 25 MG RE SUPP Rectal Place 1 suppository  (25 mg total) rectally every 6 (six) hours as needed. For nausea/vomiting. 12 each 0  . ZOLPIDEM TARTRATE 5 MG PO TABS Oral Take 1 tablet (5 mg total) by mouth at bedtime as needed for sleep (insomnia). 30 tablet 0    BP 137/100  Pulse 133  Temp 98.3 F (36.8 C) (Oral)  Resp 24  SpO2 100%  Physical Exam  Constitutional: She is oriented to person, place, and time. She appears well-developed and well-nourished.  HENT:  Head: Normocephalic and atraumatic.       Dry mm  Eyes: Conjunctivae are normal. Pupils are equal, round, and reactive to light.  Neck: Normal range of motion. Neck supple.  Pulmonary/Chest: Effort normal and breath sounds normal. She has no wheezes. She has no rales.  Abdominal: Soft. Bowel sounds are normal. There is tenderness. There is no rebound and no guarding.  Musculoskeletal: Normal range of motion. She exhibits no edema.  Neurological: She is alert and oriented to person, place, and time.  Skin: Skin is warm and dry. She is not diaphoretic.  Psychiatric: She has a normal mood and affect.    ED Course  Procedures (including critical care time)  Labs Reviewed  CBC - Abnormal; Notable for the following:    WBC 11.1 (*)     All other components within normal limits  DIFFERENTIAL - Abnormal; Notable for the following:    Neutrophils Relative 78 (*)     Neutro Abs 8.7 (*)     All other components within normal limits  URINALYSIS, ROUTINE W REFLEX MICROSCOPIC  HEPATIC FUNCTION PANEL  LIPASE, BLOOD   No results found.   No diagnosis found.    MDM   Date: 01/12/2012  Rate: 94  Rhythm: normal sinus rhythm  QRS Axis: normal  Intervals: normal  ST/T Wave abnormalities: normal  Conduction Disutrbances: none  Narrative Interpretation: unremarkable    Rehydrated, IV potassium given.  Patient PO challenging without vomiting.  Will d/c to home with suppositories.  Return for return of vomiting.  Patient verbalizes understanding and agrees to follow  up       Jamisen Duerson Alfonso Patten, MD 01/12/12 [phone]

## 2012-01-12 NOTE — ED Notes (Signed)
Patient denies pain and is resting comfortably.  

## 2012-01-12 NOTE — ED Notes (Signed)
PT. ARRIVED WITH EMS FROM HOME , REPORTS PERSISTENT VOMITTING AND LEFT ABDOMINAL PAIN ONSET THIS EVENING , DENIES DIARRHEA ,FEVER OR CHILLS , STATES HISTORY OF GASTROPARESIS.

## 2012-01-12 NOTE — Discharge Instructions (Signed)
Foods Rich in Potassium The body needs potassium to:  Control blood pressure.   Keep the muscles healthy.   Keep the nervous system healthy.  Most foods contain potassium. Eating a variety of foods in the right amounts will help control the level of potassium in your body.  Food / Potassium (mg)  Apricots, dried,  cup / 378 mg   Apricots, raw, 1 cup halves / 401 mg   Avocado,  / 487 mg   Banana, 1 large / 487 mg   Beef, lean, round, 3 oz / 202 mg   Cantaloupe, 1 cup cubes / 427 mg   Dates, medjool, 5 whole / 835 mg   Ham, cured, 3 oz / 212 mg   Lentils, dried,  cup / 458 mg   Lima beans, frozen,  cup / 258 mg   Orange, 1 large / 333 mg   Orange juice, 1 cup / 443 mg   Peaches, dried,  cup / 398 mg   Peas, split, cooked,  cup / 355 mg   Potato, boiled, 1 medium / 515 mg   Prunes, dried, uncooked,  cup / 318 mg   Raisins,  cup / 309 mg   Salmon, pink, raw, 3 oz / 275 mg   Sardines, canned , 3 oz / 338 mg   Tomato, raw, 1 medium / 292 mg   Tomato juice, 6 oz / 417 mg   [place], 3 oz / 349 mg  Other Foods High in Potassium (greater than 250 mg):  Bran cereals and other bran products.   Milk (skim, 1%, 2%, whole).   Buttermilk.   Yogurt.   Nuts.   Dried fruits.   Cherries.   Sweet potatoes.   Oranges.   Baked Beans.   Broccoli.   Spinach.   Peanut butter.   Tofu.  Foods Lower in Potassium (less than 250 mg):  Pasta.   Rice.   Cottage cheese.   Cheddar cheese.   Apples.   Mango.   Grapes.   Grapefruit.   Pineapple.   Raspberries.   Strawberries.   Watermelon.   Green Beans.   Cabbage.   Carrots.   Cauliflower.   Celery.   Corn.    Mushrooms.   Onions.   Squash.   Eggs.  The list below tells you how big or small some common portion sizes are:  1 oz.........4 stacked dice.   3 oz........Marland KitchenDeck of cards.   1 tsp.......Marland KitchenTip of little finger.   1 tbs......Marland KitchenMarland KitchenThumb.   2 tbs.......Marland KitchenGolf  ball.    cup......Marland KitchenHalf of a fist.   1 cup.......Marland KitchenA fist.  Document Released: 01/04/2008 Document Revised: 03/30/2011 Document Reviewed: 12/01/2008 [facility] Patient Information 2012 [place].Diabetic Neuropathy Diabetic neuropathy is a common complication caused by diabetes. Neuropathy is a term that means nerve disease or damage. If your diabetes is uncontrolled and you have high blood glucose (sugar) levels, over time, this can lead to damage to nerves throughout your body. There are three types of diabetic neuropathy:   Peripheral.   Autonomic.   Focal.  PERIPHERAL NEUROPATHY Peripheral neuropathy is the most common form of diabetic neuropathy. It causes damage to the nerves of the feet and legs and eventually the hands and arms.  SYMPTOMS  Peripheral neuropathy occurs slowly over time. The peripheral nerves sense touch, hot and cold, and pain. When these nerves no longer work:   Your feet become numb.   You can no longer feel pressure or pain in your  feet.   You may have burning, stabbing or aching pain.  This can lead to:  Thick calluses over pressure areas.   Pressure sores.   Ulcers. Ulcers can become infected with germs (bacteria) and can even lead to infection in the bones of the feet.  DIAGNOSIS  The diagnosis of diabetic neuropathy is difficult at best. Sensory function testing can be done with:  Light touch using a monofilament.   Vibration with tuning fork.   Sharp sensation with pin prick  Other tests that can help diagnose neuropathy are:  Nerve Conduction Velocities (NCV). This checks the transmission of electrical current through a nerve.   Electromyography (EMG). This shows how muscles respond to electrical signals transmitted by nearby nerves.   Quantitative sensory testing, which is used to assess how your nerves respond to vibration and changes in temperature.  AUTONOMIC NEUROPATHY The autonomic nervous system controls functions that you do  not think about. Examples would be:   Heart beat.   Regulation of body temperature.   Blood pressure.   Urination.   Digestion.   Sweating.   Sexual function.  SYMPTOMS  The symptoms of autonomic neuropathy vary depending on which nerves are affected.   There can be problems with digestion such as:   Feeling sick to your stomach (nausea).   Vomiting.   Bloating.   Constipation.   Diarrhea.   Abdominal pain.   Difficulty with urination may occur because of the inability to sense when your bladder is full. You may have urine leakage (incontinence) or inability to empty your bladder completely (retention).   Palpitations or a feeling of an abnormal heart beat.   Blood pressure drops on arising (orthostatic hypotension). This can happen when you first sit up or stand up. It causes you to feel:   Dizzy.   Weak.   Faint.   Sexual functioning:   In men, inability to attain and maintain an erection.   In women, vaginal dryness and problems with decreased sexual desire and arousal.  DIAGNOSIS  Diagnosis is often based on reported symptoms. Tell your medical caregiver if you experience:   Dizziness.   Constipation.   Diarrhea.   Inappropriate urination or inability to urinate.   Inability to get or maintain an erection.  Tests that may be done include:  An EKG or Holter Monitor. These are tests that can help show problems with the heart rate or heart rhythm.   X-rays can be used to find if there are problems with your ability to properly empty food from your stomach into the small intestine after eating.  FOCAL NEUROPATHY Focal neuropathy affects just one nerve tract and occurs suddenly. However, it usually improves by itself over time. It does not cause long term damage, and treatments are usually needed only until the problem improves. SYMPTOMS  Examples include:   Abnormal eye movements or abnormal alignment of both eyes.   Weakness in the wrist.    Foot drop, which results in inability to lift the foot properly. This causes abnormal walking or foot movement.  DIAGNOSIS  Diagnosis is made based on your symptoms and what your caregiver finds on your exam. Other tests that may be done include:  Nerve Conduction Velocities (NCV). This checks the transmission of electrical current through a nerve.   Electromyography (EMG). This shows how muscles respond to electrical signals transmitted by nearby nerves.   Quantitative sensory testing, which is used to assess how your nerves respond to vibration and changes  in temperature.  TREATMENT Once nerve damage occurs it cannot be reversed. The goal of treatment is to keep the disease from getting worse. If it gets worse, it will affect more nerve fibers. Controlling your blood (sugar) is the key. You will need to keep your blood glucose and A1c at the target range prescribed by your caregiver. Things that will help control blood glucose levels include:  Blood glucose monitoring.   Meal planning.   Physical activity.   Diabetes medication.  Over time, maintaining lower blood glucose levels helps lessen symptoms. Sometimes, prescription pain medicine is needed. Focal neuropathy can be painful and unpredictable and occurs most often in older adults with diabetes.  SEEK MEDICAL CARE IF:   You develop peripheral nerve symptoms such as burning, numbness, or pain in your feet, legs or hands.   You develop autonomic nerve symptoms such as:   Dizziness.   Abnormal urinary control.   Inability to get an erection.   You develop focal nerve symptoms such as sudden abnormal eye movements or sudden foot drop.  Document Released: 09/26/2001 Document Revised: 07/07/2011 Document Reviewed: 12/26/2008 [facility] Patient Information 2012 [place], [place].B.R.A.T. Diet Your doctor has recommended the B.R.A.T. diet for you or your child until the condition improves. This is often used to help control  diarrhea and vomiting symptoms. If you or your child can tolerate clear liquids, you may have:  Bananas.   Rice.   Applesauce.   Toast (and other simple starches such as crackers, potatoes, noodles).  Be sure to avoid dairy products, meats, and fatty foods until symptoms are better. Fruit juices such as apple, grape, and prune juice can make diarrhea worse. Avoid these. Continue this diet for 2 days or as instructed by your caregiver. Document Released: 07/18/2005 Document Revised: 07/07/2011 Document Reviewed: 01/04/2007 [facility] Patient Information 2012 [place].

## 2012-02-08 ENCOUNTER — Emergency Department (HOSPITAL_COMMUNITY)
Admission: EM | Admit: 2012-02-08 | Discharge: 2012-02-09 | Disposition: A | Discharge status: Home Health | Payer: PRIVATE HEALTH INSURANCE | Attending: Emergency Medicine | Admitting: Emergency Medicine

## 2012-02-08 ENCOUNTER — Encounter (HOSPITAL_COMMUNITY): Payer: Self-pay | Admitting: *Deleted

## 2012-02-08 DIAGNOSIS — I252 Old myocardial infarction: Secondary | ICD-10-CM | POA: Insufficient documentation

## 2012-02-08 DIAGNOSIS — F17200 Nicotine dependence, unspecified, uncomplicated: Secondary | ICD-10-CM | POA: Insufficient documentation

## 2012-02-08 DIAGNOSIS — K219 Gastro-esophageal reflux disease without esophagitis: Secondary | ICD-10-CM | POA: Insufficient documentation

## 2012-02-08 DIAGNOSIS — J449 Chronic obstructive pulmonary disease, unspecified: Secondary | ICD-10-CM | POA: Insufficient documentation

## 2012-02-08 DIAGNOSIS — R1033 Periumbilical pain: Secondary | ICD-10-CM

## 2012-02-08 DIAGNOSIS — I10 Essential (primary) hypertension: Secondary | ICD-10-CM | POA: Insufficient documentation

## 2012-02-08 DIAGNOSIS — Z794 Long term (current) use of insulin: Secondary | ICD-10-CM | POA: Insufficient documentation

## 2012-02-08 DIAGNOSIS — Z7982 Long term (current) use of aspirin: Secondary | ICD-10-CM | POA: Insufficient documentation

## 2012-02-08 DIAGNOSIS — Z9089 Acquired absence of other organs: Secondary | ICD-10-CM | POA: Insufficient documentation

## 2012-02-08 DIAGNOSIS — I2510 Atherosclerotic heart disease of native coronary artery without angina pectoris: Secondary | ICD-10-CM | POA: Insufficient documentation

## 2012-02-08 DIAGNOSIS — Z79899 Other long term (current) drug therapy: Secondary | ICD-10-CM | POA: Insufficient documentation

## 2012-02-08 DIAGNOSIS — J4489 Other specified chronic obstructive pulmonary disease: Secondary | ICD-10-CM | POA: Insufficient documentation

## 2012-02-08 LAB — COMPREHENSIVE METABOLIC PANEL
AST: 16 U/L (ref 0–37)
Albumin: 3.3 g/dL — ABNORMAL LOW (ref 3.5–5.2)
Alkaline Phosphatase: 137 U/L — ABNORMAL HIGH (ref 39–117)
BUN: 14 mg/dL (ref 6–23)
Creatinine, Ser: 0.53 mg/dL (ref 0.50–1.10)
Potassium: 3.6 mEq/L (ref 3.5–5.1)
Total Protein: 8 g/dL (ref 6.0–8.3)

## 2012-02-08 LAB — CBC WITH DIFFERENTIAL/PLATELET
Basophils Absolute: 0 10*3/uL (ref 0.0–0.1)
Basophils Relative: 0 % (ref 0–1)
Eosinophils Absolute: 0 10*3/uL (ref 0.0–0.7)
HCT: 43.8 % (ref 36.0–46.0)
MCV: 85.9 fL (ref 78.0–100.0)
Monocytes Relative: 2 % — ABNORMAL LOW (ref 3–12)
Neutro Abs: 5.7 10*3/uL (ref 1.7–7.7)
Platelets: 202 10*3/uL (ref 150–400)
RBC: 5.1 MIL/uL (ref 3.87–5.11)
WBC: 7.2 10*3/uL (ref 4.0–10.5)

## 2012-02-08 LAB — LIPASE, BLOOD: Lipase: 13 U/L (ref 11–59)

## 2012-02-08 LAB — GLUCOSE, CAPILLARY: Glucose-Capillary: 277 mg/dL — ABNORMAL HIGH (ref 70–99)

## 2012-02-08 MED ORDER — PROMETHAZINE HCL 25 MG/ML IJ SOLN
25.0000 mg | Freq: Once | INTRAMUSCULAR | Status: AC
  Administered 2012-02-08: 25 mg via INTRAMUSCULAR
  Filled 2012-02-08 (×2): qty 1

## 2012-02-08 MED ORDER — HYDROMORPHONE HCL PF 1 MG/ML IJ SOLN
1.0000 mg | Freq: Once | INTRAMUSCULAR | Status: AC
  Administered 2012-02-08: 1 mg via INTRAMUSCULAR
  Filled 2012-02-08: qty 1

## 2012-02-08 MED ORDER — HYDROMORPHONE HCL PF 1 MG/ML IJ SOLN
1.0000 mg | Freq: Once | INTRAMUSCULAR | Status: AC
  Administered 2012-02-08: 1 mg via INTRAVENOUS
  Filled 2012-02-08: qty 1

## 2012-02-08 MED ORDER — LORAZEPAM 2 MG/ML IJ SOLN
1.0000 mg | Freq: Once | INTRAMUSCULAR | Status: AC
  Administered 2012-02-08: 1 mg via INTRAVENOUS
  Filled 2012-02-08: qty 1

## 2012-02-08 MED ORDER — PROMETHAZINE HCL 25 MG/ML IJ SOLN
12.5000 mg | Freq: Once | INTRAMUSCULAR | Status: AC
  Administered 2012-02-08: 12.5 mg via INTRAVENOUS
  Filled 2012-02-08: qty 1

## 2012-02-08 MED ORDER — SODIUM CHLORIDE 0.9 % IV BOLUS (SEPSIS)
1000.0000 mL | Freq: Once | INTRAVENOUS | Status: AC
  Administered 2012-02-08: 1000 mL via INTRAVENOUS

## 2012-02-08 NOTE — ED Notes (Signed)
IV team has been paged to start IV and draw labs.  ED PA, Lawyer has been updated on this.

## 2012-02-08 NOTE — ED Notes (Signed)
Pt c/o abd pain, vomiting x2 days, and chills.

## 2012-02-08 NOTE — ED Notes (Signed)
IV RN has called back and will be coming to attempt IV access.

## 2012-02-08 NOTE — ED Notes (Signed)
Pt states she is allergic to Compazine, requesting Phenergan, states she is not allergic to Phenergan.

## 2012-02-08 NOTE — ED Notes (Addendum)
Attempted 2 IVs and Deb rn attempted 2 IVs, unsuccessful attempts.  Pt also refused blood draws by phlebectomy tech.

## 2012-02-08 NOTE — ED Notes (Addendum)
Friend states pt started to throw up blood about 3 hours ago, no diarrhea, throwing up in triage pink content, does not look as it if is blood, more of a food color

## 2012-02-08 NOTE — ED Notes (Signed)
Pt refused lab work at this time.

## 2012-02-09 ENCOUNTER — Inpatient Hospital Stay (HOSPITAL_COMMUNITY): Payer: PRIVATE HEALTH INSURANCE

## 2012-02-09 ENCOUNTER — Encounter (HOSPITAL_COMMUNITY): Payer: Self-pay | Admitting: Emergency Medicine

## 2012-02-09 ENCOUNTER — Inpatient Hospital Stay (HOSPITAL_COMMUNITY)
Admission: EM | Admit: 2012-02-09 | Discharge: 2012-02-13 | DRG: 074 | Disposition: A | Discharge status: Home / Self Care | Payer: PRIVATE HEALTH INSURANCE | Attending: Internal Medicine | Admitting: Internal Medicine

## 2012-02-09 DIAGNOSIS — E876 Hypokalemia: Secondary | ICD-10-CM

## 2012-02-09 DIAGNOSIS — K219 Gastro-esophageal reflux disease without esophagitis: Secondary | ICD-10-CM

## 2012-02-09 DIAGNOSIS — R1115 Cyclical vomiting syndrome unrelated to migraine: Secondary | ICD-10-CM | POA: Diagnosis present

## 2012-02-09 DIAGNOSIS — Z794 Long term (current) use of insulin: Secondary | ICD-10-CM

## 2012-02-09 DIAGNOSIS — IMO0002 Reserved for concepts with insufficient information to code with codable children: Secondary | ICD-10-CM

## 2012-02-09 DIAGNOSIS — E1149 Type 2 diabetes mellitus with other diabetic neurological complication: Principal | ICD-10-CM | POA: Diagnosis present

## 2012-02-09 DIAGNOSIS — K3184 Gastroparesis: Secondary | ICD-10-CM

## 2012-02-09 DIAGNOSIS — Z79899 Other long term (current) drug therapy: Secondary | ICD-10-CM

## 2012-02-09 DIAGNOSIS — F17200 Nicotine dependence, unspecified, uncomplicated: Secondary | ICD-10-CM | POA: Diagnosis present

## 2012-02-09 DIAGNOSIS — K861 Other chronic pancreatitis: Secondary | ICD-10-CM | POA: Diagnosis present

## 2012-02-09 DIAGNOSIS — I252 Old myocardial infarction: Secondary | ICD-10-CM

## 2012-02-09 DIAGNOSIS — E1165 Type 2 diabetes mellitus with hyperglycemia: Secondary | ICD-10-CM

## 2012-02-09 DIAGNOSIS — J4489 Other specified chronic obstructive pulmonary disease: Secondary | ICD-10-CM | POA: Diagnosis present

## 2012-02-09 DIAGNOSIS — N390 Urinary tract infection, site not specified: Secondary | ICD-10-CM

## 2012-02-09 DIAGNOSIS — I10 Essential (primary) hypertension: Secondary | ICD-10-CM

## 2012-02-09 DIAGNOSIS — G589 Mononeuropathy, unspecified: Secondary | ICD-10-CM | POA: Diagnosis present

## 2012-02-09 DIAGNOSIS — R112 Nausea with vomiting, unspecified: Secondary | ICD-10-CM

## 2012-02-09 DIAGNOSIS — J449 Chronic obstructive pulmonary disease, unspecified: Secondary | ICD-10-CM | POA: Diagnosis present

## 2012-02-09 DIAGNOSIS — I2510 Atherosclerotic heart disease of native coronary artery without angina pectoris: Secondary | ICD-10-CM | POA: Diagnosis present

## 2012-02-09 DIAGNOSIS — E785 Hyperlipidemia, unspecified: Secondary | ICD-10-CM

## 2012-02-09 DIAGNOSIS — E860 Dehydration: Secondary | ICD-10-CM

## 2012-02-09 DIAGNOSIS — G8929 Other chronic pain: Secondary | ICD-10-CM | POA: Diagnosis present

## 2012-02-09 DIAGNOSIS — K5900 Constipation, unspecified: Secondary | ICD-10-CM

## 2012-02-09 DIAGNOSIS — Z7982 Long term (current) use of aspirin: Secondary | ICD-10-CM

## 2012-02-09 DIAGNOSIS — R109 Unspecified abdominal pain: Secondary | ICD-10-CM | POA: Diagnosis present

## 2012-02-09 LAB — COMPREHENSIVE METABOLIC PANEL
ALT: 9 U/L (ref 0–35)
AST: 19 U/L (ref 0–37)
Albumin: 2.6 g/dL — ABNORMAL LOW (ref 3.5–5.2)
Alkaline Phosphatase: 140 U/L — ABNORMAL HIGH (ref 39–117)
CO2: 22 mEq/L (ref 19–32)
Calcium: 8.7 mg/dL (ref 8.4–10.5)
Chloride: 97 mEq/L (ref 96–112)
Creatinine, Ser: 0.49 mg/dL — ABNORMAL LOW (ref 0.50–1.10)
GFR calc Af Amer: >90 mL/min (ref >90)
GFR calc non Af Amer: >90 mL/min (ref >90)
Glucose, Bld: 320 mg/dL — ABNORMAL HIGH (ref 70–99)
Potassium: 3.3 mEq/L — ABNORMAL LOW (ref 3.5–5.1)
Sodium: 133 mEq/L — ABNORMAL LOW (ref 135–145)
Total Protein: 6.7 g/dL (ref 6.0–8.3)

## 2012-02-09 LAB — URINALYSIS, ROUTINE W REFLEX MICROSCOPIC
Bilirubin Urine: NEGATIVE
Nitrite: NEGATIVE
Specific Gravity, Urine: 1.027 (ref 1.005–1.030)
pH: 6 (ref 5.0–8.0)

## 2012-02-09 LAB — OCCULT BLOOD GASTRIC / DUODENUM (SPECIMEN CUP): Occult Blood, Gastric: POSITIVE — AB

## 2012-02-09 LAB — URINE MICROSCOPIC-ADD ON

## 2012-02-09 LAB — DIFFERENTIAL
Basophils Absolute: 0 10*3/uL (ref 0.0–0.1)
Lymphocytes Relative: 20 % (ref 12–46)
Monocytes Absolute: 0.6 10*3/uL (ref 0.1–1.0)
Monocytes Relative: 6 % (ref 3–12)
Neutro Abs: 7.4 10*3/uL (ref 1.7–7.7)

## 2012-02-09 LAB — CBC
HCT: 38.3 % (ref 36.0–46.0)
Hemoglobin: 16.1 g/dL — ABNORMAL HIGH (ref 12.0–15.0)
Hemoglobin: 13.5 g/dL (ref 12.0–15.0)
RBC: 5.32 MIL/uL — ABNORMAL HIGH (ref 3.87–5.11)
WBC: 10 10*3/uL (ref 4.0–10.5)

## 2012-02-09 LAB — PROTIME-INR
INR: 0.95 (ref 0.00–1.49)
Prothrombin Time: 12.9 seconds (ref 11.6–15.2)

## 2012-02-09 LAB — PHOSPHORUS: Phosphorus: 3.1 mg/dL (ref 2.3–4.6)

## 2012-02-09 MED ORDER — GABAPENTIN 300 MG PO CAPS
600.0000 mg | ORAL_CAPSULE | Freq: Three times a day (TID) | ORAL | Status: DC
  Administered 2012-02-10: 300 mg via ORAL
  Administered 2012-02-11 – 2012-02-13 (×8): 600 mg via ORAL
  Filled 2012-02-09 (×13): qty 2

## 2012-02-09 MED ORDER — ADULT MULTIVITAMIN W/MINERALS CH
1.0000 | ORAL_TABLET | Freq: Every day | ORAL | Status: DC
  Administered 2012-02-11 – 2012-02-13 (×3): 1 via ORAL
  Filled 2012-02-09 (×4): qty 1

## 2012-02-09 MED ORDER — PROMETHAZINE HCL 25 MG PO TABS: 25.0000 mg | ORAL_TABLET | Freq: Four times a day (QID) | ORAL | Status: AC | PRN

## 2012-02-09 MED ORDER — PROMETHAZINE HCL 25 MG/ML IJ SOLN
12.5000 mg | Freq: Four times a day (QID) | INTRAMUSCULAR | Status: DC | PRN
  Administered 2012-02-09 – 2012-02-10 (×5): 12.5 mg via INTRAMUSCULAR
  Filled 2012-02-09 (×5): qty 1

## 2012-02-09 MED ORDER — ZOLPIDEM TARTRATE 5 MG PO TABS: 5.0000 mg | ORAL_TABLET | Freq: Every evening | ORAL | Status: DC | PRN

## 2012-02-09 MED ORDER — METOCLOPRAMIDE HCL 5 MG/ML IJ SOLN
10.0000 mg | Freq: Four times a day (QID) | INTRAMUSCULAR | Status: DC
  Administered 2012-02-09 – 2012-02-13 (×15): 10 mg via INTRAVENOUS
  Filled 2012-02-09 (×19): qty 2

## 2012-02-09 MED ORDER — LABETALOL HCL 5 MG/ML IV SOLN
20.0000 mg | Freq: Once | INTRAVENOUS | Status: AC
  Administered 2012-02-09: 20 mg via INTRAVENOUS
  Filled 2012-02-09: qty 4

## 2012-02-09 MED ORDER — PROMETHAZINE HCL 25 MG/ML IJ SOLN
INTRAMUSCULAR | Status: AC
  Filled 2012-02-09: qty 1

## 2012-02-09 MED ORDER — GABAPENTIN 600 MG PO TABS
600.0000 mg | ORAL_TABLET | Freq: Three times a day (TID) | ORAL | Status: DC
  Filled 2012-02-09: qty 1

## 2012-02-09 MED ORDER — FLUTICASONE-SALMETEROL 250-50 MCG/DOSE IN AEPB
1.0000 | INHALATION_SPRAY | Freq: Two times a day (BID) | RESPIRATORY_TRACT | Status: DC
  Administered 2012-02-10 – 2012-02-13 (×7): 1 via RESPIRATORY_TRACT
  Filled 2012-02-09: qty 14

## 2012-02-09 MED ORDER — CIPROFLOXACIN IN D5W 400 MG/200ML IV SOLN
400.0000 mg | INTRAVENOUS | Status: DC
  Administered 2012-02-10 – 2012-02-12 (×4): 400 mg via INTRAVENOUS
  Filled 2012-02-09 (×4): qty 200

## 2012-02-09 MED ORDER — OXYCODONE HCL 5 MG PO TABS: 5.0000 mg | ORAL_TABLET | ORAL | Status: DC | PRN

## 2012-02-09 MED ORDER — HYDROMORPHONE HCL PF 1 MG/ML IJ SOLN
0.5000 mg | Freq: Once | INTRAMUSCULAR | Status: AC
  Administered 2012-02-09: 0.5 mg via INTRAVENOUS
  Filled 2012-02-09: qty 1

## 2012-02-09 MED ORDER — ATORVASTATIN CALCIUM 40 MG PO TABS
40.0000 mg | ORAL_TABLET | Freq: Every day | ORAL | Status: DC
  Administered 2012-02-11 – 2012-02-12 (×2): 40 mg via ORAL
  Filled 2012-02-09 (×4): qty 1

## 2012-02-09 MED ORDER — INSULIN ASPART 100 UNIT/ML ~~LOC~~ SOLN
0.0000 [IU] | Freq: Three times a day (TID) | SUBCUTANEOUS | Status: DC
  Administered 2012-02-09: 11 [IU] via SUBCUTANEOUS
  Administered 2012-02-10: 3 [IU] via SUBCUTANEOUS
  Administered 2012-02-10 (×2): 2 [IU] via SUBCUTANEOUS
  Filled 2012-02-09: qty 1

## 2012-02-09 MED ORDER — HYDROMORPHONE HCL PF 1 MG/ML IJ SOLN
INTRAMUSCULAR | Status: AC
  Filled 2012-02-09: qty 1

## 2012-02-09 MED ORDER — POTASSIUM CHLORIDE 10 MEQ/100ML IV SOLN
10.0000 meq | INTRAVENOUS | Status: AC
  Administered 2012-02-09 (×3): 10 meq via INTRAVENOUS
  Filled 2012-02-09 (×3): qty 100

## 2012-02-09 MED ORDER — HYDROCODONE-ACETAMINOPHEN 5-325 MG PO TABS: 1.0000 | ORAL_TABLET | Freq: Four times a day (QID) | ORAL | Status: DC | PRN

## 2012-02-09 MED ORDER — LISINOPRIL 20 MG PO TABS
20.0000 mg | ORAL_TABLET | Freq: Every day | ORAL | Status: DC
  Administered 2012-02-11 – 2012-02-13 (×3): 20 mg via ORAL
  Filled 2012-02-09 (×5): qty 1

## 2012-02-09 MED ORDER — DOCUSATE SODIUM 100 MG PO CAPS
100.0000 mg | ORAL_CAPSULE | Freq: Two times a day (BID) | ORAL | Status: DC
  Administered 2012-02-11 – 2012-02-13 (×5): 100 mg via ORAL
  Filled 2012-02-09 (×9): qty 1

## 2012-02-09 MED ORDER — METOPROLOL SUCCINATE ER 50 MG PO TB24
50.0000 mg | ORAL_TABLET | Freq: Every day | ORAL | Status: DC
  Administered 2012-02-11 – 2012-02-13 (×3): 50 mg via ORAL
  Filled 2012-02-09 (×5): qty 1

## 2012-02-09 MED ORDER — INSULIN ASPART 100 UNIT/ML ~~LOC~~ SOLN: 0.0000 [IU] | Freq: Every day | SUBCUTANEOUS | Status: DC

## 2012-02-09 MED ORDER — SODIUM CHLORIDE 0.9 % IV SOLN
INTRAVENOUS | Status: AC
  Administered 2012-02-09: 21:00:00 via INTRAVENOUS

## 2012-02-09 MED ORDER — PANTOPRAZOLE SODIUM 40 MG PO TBEC
80.0000 mg | DELAYED_RELEASE_TABLET | Freq: Every day | ORAL | Status: DC
  Filled 2012-02-09: qty 2

## 2012-02-09 MED ORDER — POTASSIUM CHLORIDE CRYS ER 20 MEQ PO TBCR
40.0000 meq | EXTENDED_RELEASE_TABLET | Freq: Once | ORAL | Status: DC
  Filled 2012-02-09: qty 2

## 2012-02-09 MED ORDER — HYDROMORPHONE HCL PF 1 MG/ML IJ SOLN
1.0000 mg | Freq: Once | INTRAMUSCULAR | Status: AC
  Administered 2012-02-09: 1 mg via INTRAMUSCULAR
  Filled 2012-02-09: qty 1

## 2012-02-09 MED ORDER — HYDROMORPHONE HCL PF 2 MG/ML IJ SOLN
2.0000 mg | Freq: Once | INTRAMUSCULAR | Status: AC
  Administered 2012-02-09: 2 mg via INTRAMUSCULAR
  Filled 2012-02-09: qty 1

## 2012-02-09 MED ORDER — HYDROMORPHONE HCL PF 2 MG/ML IJ SOLN
1.5000 mg | INTRAMUSCULAR | Status: DC | PRN
  Administered 2012-02-10: 1.5 mg via INTRAVENOUS
  Filled 2012-02-09: qty 1

## 2012-02-09 MED ORDER — PROMETHAZINE HCL 25 MG/ML IJ SOLN
25.0000 mg | Freq: Once | INTRAMUSCULAR | Status: DC
Start: 2012-02-09 — End: 2012-02-09
  Filled 2012-02-09: qty 1

## 2012-02-09 MED ORDER — SODIUM CHLORIDE 0.9 % IV BOLUS (SEPSIS)
1000.0000 mL | Freq: Once | INTRAVENOUS | Status: AC
  Administered 2012-02-09: 1000 mL via INTRAVENOUS

## 2012-02-09 MED ORDER — HYDROMORPHONE HCL PF 1 MG/ML IJ SOLN
1.0000 mg | Freq: Once | INTRAMUSCULAR | Status: AC
  Administered 2012-02-09: 1 mg via INTRAVENOUS
  Filled 2012-02-09: qty 1

## 2012-02-09 MED ORDER — ASPIRIN EC 81 MG PO TBEC
81.0000 mg | DELAYED_RELEASE_TABLET | Freq: Every day | ORAL | Status: DC
  Administered 2012-02-11 – 2012-02-13 (×3): 81 mg via ORAL
  Filled 2012-02-09 (×4): qty 1

## 2012-02-09 MED ORDER — LIDOCAINE HCL 1 % IJ SOLN
INTRAMUSCULAR | Status: AC
  Administered 2012-02-09: 17:00:00 via INTRAMUSCULAR
  Filled 2012-02-09: qty 20

## 2012-02-09 MED ORDER — ASPIRIN 81 MG PO TBEC: 81.0000 mg | DELAYED_RELEASE_TABLET | Freq: Every day | ORAL | Status: DC

## 2012-02-09 MED ORDER — CIPROFLOXACIN IN D5W 400 MG/200ML IV SOLN
400.0000 mg | Freq: Once | INTRAVENOUS | Status: AC
  Administered 2012-02-09: 400 mg via INTRAVENOUS
  Filled 2012-02-09: qty 200

## 2012-02-09 MED ORDER — SODIUM CHLORIDE 0.9 % IV BOLUS (SEPSIS)
1000.0000 mL | Freq: Once | INTRAVENOUS | Status: AC
  Administered 2012-02-09 (×2): 1000 mL via INTRAVENOUS

## 2012-02-09 MED ORDER — INSULIN GLARGINE 100 UNIT/ML ~~LOC~~ SOLN
10.0000 [IU] | Freq: Every day | SUBCUTANEOUS | Status: DC
  Administered 2012-02-09 – 2012-02-12 (×3): 10 [IU] via SUBCUTANEOUS

## 2012-02-09 MED ORDER — HYDROMORPHONE HCL PF 1 MG/ML IJ SOLN
1.0000 mg | INTRAMUSCULAR | Status: DC | PRN
  Administered 2012-02-09: 1 mg via INTRAVENOUS

## 2012-02-09 MED ORDER — PROMETHAZINE HCL 25 MG PO TABS
12.5000 mg | ORAL_TABLET | Freq: Four times a day (QID) | ORAL | Status: DC | PRN
  Administered 2012-02-12 – 2012-02-13 (×2): 12.5 mg via ORAL
  Filled 2012-02-09 (×2): qty 1

## 2012-02-09 MED ORDER — PROMETHAZINE HCL 25 MG/ML IJ SOLN
25.0000 mg | Freq: Once | INTRAMUSCULAR | Status: AC
  Administered 2012-02-09: 25 mg via INTRAMUSCULAR

## 2012-02-09 MED ORDER — PREGABALIN 50 MG PO CAPS
100.0000 mg | ORAL_CAPSULE | Freq: Two times a day (BID) | ORAL | Status: DC
  Administered 2012-02-11 – 2012-02-13 (×5): 100 mg via ORAL
  Filled 2012-02-09 (×5): qty 2

## 2012-02-09 NOTE — ED Provider Notes (Signed)
History     CSN: [id number]  Arrival date & time 02/09/12  1152   First MD Initiated Contact with Patient 02/09/12 1200      Chief Complaint  Patient presents with  . Emesis  . Hematemesis  . Hyperglycemia    (Consider location/radiation/quality/duration/timing/severity/associated sxs/prior treatment) HPI Pt with hx of HTN, DM, gastroparesis, chronic pancreatitis presents with c/o vomiting and abdominal pain.  She was seen and discharged from ED last night.  Today she states she has continued to vomit and have abdominal pain.  She has not filled prescriptions given last night.  Emesis tea colored.  Non bilious.  She has had no diarrhea.  Pain is sharp and constant, diffuse.  Denies fever/chills.  There are no other associated systemic symptoms, there are no other alleviating or modifying factors.   Past Medical History  Diagnosis Date  . Hypertension   . Diabetes mellitus   . Gastroparesis   . Asthma   . GERD (gastroesophageal reflux disease)   . Coronary artery disease   . h/o seizure   . Neuropathy   . Chronic pancreatitis   . Dyslipidemia   . MI (myocardial infarction)   . COPD (chronic obstructive pulmonary disease)     Past Surgical History  Procedure Date  . Abdominal hysterectomy   . Back surgery   . Cholecystectomy   . Peg tube x 4   . Cesarean section     Family History  Problem Relation Age of Onset  . Cancer Mother     History  Substance Use Topics  . Smoking status: Current Everyday Smoker -- 0.2 packs/day for 2 years    Types: Cigarettes  . Smokeless tobacco: Never Used  . Alcohol Use: No     hx 1 beer daily -pt states she quit Dec. 2012    OB History    Grav Para Term Preterm Abortions TAB SAB Ect Mult Living                  Review of Systems ROS reviewed and all otherwise negative except for mentioned in HPI  Allergies  Compazine; Darvocet; Nsaids; Penicillins; and Zofran  Home Medications   No current outpatient prescriptions  on file.  BP 128/77  Pulse 105  Temp 98.2 F (36.8 C) (Oral)  Resp 18  Ht 5\' 5"  (1.651 m)  Wt 128 lb 1.4 oz (58.1 kg)  BMI 21.31 kg/m2  SpO2 96% Vitals reviewed Physical Exam Physical Examination: General appearance - alert, chronically ill appearing, and in no distress Mental status - alert, oriented to person, place, and time Eyes - pupils equal and reactive, no conjunctival injection, no scleral icterus Mouth - mucous membranes dry, OP without lesions Chest - clear to auscultation, no wheezes, rales or rhonchi, symmetric air entry Heart - normal rate, regular rhythm, normal S1, S2, no murmurs, rubs, clicks or gallops Abdomen - soft, diffuse tenderness to palpation, nondistended, no masses or organomegaly Musculoskeletal - no joint tenderness, deformity or swelling Extremities - peripheral pulses normal, no pedal edema, no clubbing or cyanosis Skin - normal coloration, poor turgor, no rashes, cap refill approx 3 seconds Psych- flat affect, then crying intermittently  ED Course  Procedures (including critical care time)  3:29 PM  D/w [facility] for admission.  Pt has NG tube now which is draining clear fluid- had gastrocult positive tea colored fluid initially- no bright red blood or clots.    Labs Reviewed  URINALYSIS, ROUTINE W REFLEX MICROSCOPIC - Abnormal; Notable  for the following:    APPearance TURBID (*)     Glucose, UA 500 (*)     Hgb urine dipstick LARGE (*)     Ketones, ur 15 (*)     Protein, ur >300 (*)     Leukocytes, UA SMALL (*)     All other components within normal limits  CBC - Abnormal; Notable for the following:    RBC 5.32 (*)     Hemoglobin 16.1 (*)     All other components within normal limits  COMPREHENSIVE METABOLIC PANEL - Abnormal; Notable for the following:    Sodium 133 (*)     Potassium 3.3 (*)     Chloride 93 (*)     Glucose, Bld 320 (*)     Creatinine, Ser 0.47 (*)     Albumin 3.2 (*)     Alkaline Phosphatase 140 (*)     All other  components within normal limits  GLUCOSE, CAPILLARY - Abnormal; Notable for the following:    Glucose-Capillary 326 (*)     All other components within normal limits  OCCULT BLOOD GASTRIC / DUODENUM - Abnormal; Notable for the following:    Occult Blood, Gastric POSITIVE (*)     All other components within normal limits  URINE MICROSCOPIC-ADD ON - Abnormal; Notable for the following:    Squamous Epithelial / LPF FEW (*)     Bacteria, UA MANY (*)     All other components within normal limits  GLUCOSE, CAPILLARY - Abnormal; Notable for the following:    Glucose-Capillary 326 (*)     All other components within normal limits  COMPREHENSIVE METABOLIC PANEL - Abnormal; Notable for the following:    Potassium 2.7 (*)     Glucose, Bld 206 (*)     Creatinine, Ser 0.49 (*)     Albumin 2.6 (*)     Total Bilirubin 0.2 (*)     All other components within normal limits  COMPREHENSIVE METABOLIC PANEL - Abnormal; Notable for the following:    Potassium 2.9 (*)     Glucose, Bld 234 (*)     Albumin 2.7 (*)     All other components within normal limits  CBC - Abnormal; Notable for the following:    WBC 10.6 (*)     All other components within normal limits  GLUCOSE, CAPILLARY - Abnormal; Notable for the following:    Glucose-Capillary 171 (*)     All other components within normal limits  GLUCOSE, CAPILLARY - Abnormal; Notable for the following:    Glucose-Capillary 189 (*)     All other components within normal limits  LIPASE, BLOOD  POCT PREGNANCY, URINE  MAGNESIUM  PHOSPHORUS  CBC  DIFFERENTIAL  APTT  PROTIME-INR  TSH   Ir Fluoro Guide Cv Line Right  02/10/2012  *RADIOLOGY REPORT*  Clinical Data: Nausea  and vomiting, history of chronic pancreatitis; request is made for central venous access  for fluids and medications.  PICC LINE PLACEMENT WITH ULTRASOUND AND FLUOROSCOPIC  GUIDANCE  Fluoroscopy Time: 1.3 minutes.  The right  arm was prepped with chlorhexidine, draped in the usual  sterile fashion using maximum barrier technique (cap and mask, sterile gown, sterile gloves, large sterile sheet, hand hygiene and cutaneous antisepsis) and infiltrated locally with 1% Lidocaine.  Ultrasound demonstrated patency of the right brachial vein, and this was documented with an image.  Under real-time ultrasound guidance, this vein was accessed with a 21 gauge micropuncture needle and image documentation was  performed.  The needle was exchanged over a guidewire for a peel-away sheath through which a five [place] double lumen PICC trimmed to 38 cm was advanced, positioned with its tip at the lower SVC/right atrial junction. Fluoroscopy during the procedure and fluoro spot radiograph confirms appropriate catheter position.  The catheter was flushed, secured to the skin with Prolene sutures, and covered with a sterile dressing.  Complications:  None  IMPRESSION: Successful right arm PICC line placement with ultrasound and fluoroscopic guidance.  The catheter is ready for use.  Read by: Rowe Robert, P.A.-C  Original Report Authenticated By: Azzie Roup, M.D.   Dg Abd Acute W/chest  02/09/2012  *RADIOLOGY REPORT*  Clinical Data: Vomiting, abdominal pain  ACUTE ABDOMEN SERIES (ABDOMEN 2 VIEW & CHEST 1 VIEW)  Comparison: 01/12/2012  Findings: Nasogastric tube extends into stomach though the proximal side port is at the distal esophagus. Right arm PICC line, tip projecting over cavoatrial junction. Normal heart size, mediastinal contours, and pulmonary vascularity. Lungs clear. Numerous cardiac monitoring leads project over chest. Prior L4-L5 fusion and L4 laminectomy.  Surgical clips right upper quadrant question cholecystectomy. Metallic foreign body question BB projects over left pelvis. Marked osseous demineralization. Nonobstructive bowel gas pattern. No definite bowel dilatation, bowel wall thickening or free intraperitoneal air. Gas collection identified in pelvis, uncertain if represents bowel or  air filled bladder. Anastomotic staple lines from bowel surgery in the left mid abdomen. Single tiny questionable nonobstructing right renal calculus.  IMPRESSION: Recommend advancing nasogastric tube 8 cm to place the proximal side port in the proximal stomach. Nonobstructive bowel gas pattern though a paucity of bowel gas is noted. Rounded gas collection in the pelvis, question bowel versus air filled urinary bladder; has the patient been recently catheterized?  Original Report Authenticated By: Burnetta Sabin, M.D.     1. Dehydration   2. GERD (gastroesophageal reflux disease)   3. Hypertension   4. Nausea and vomiting   5. UTI (lower urinary tract infection)   6. Constipation   7. DM (diabetes mellitus), type 2, uncontrolled   8. Dyslipidemia   9. Gastroparesis       MDM  Pt with nausea/vomiting, abdominal pain.  Tea colored vomit- heme positive.  NG tube placed- acute abdominal series obtained.  Pt initially with IV access, then infiltrated.  IV team called and will place PICC line.  Pt admitted to [facility] for further management        Threasa Beards, MD 02/10/12 1201

## 2012-02-09 NOTE — Progress Notes (Addendum)
CRITICAL VALUE ALERT  Critical value received:  Potassium 2.7  Date of notification:  02/09/12  Time of notification:  2224  Critical value read back:yes  Nurse who received alert:  Edward Qualia, RN  MD notified (1st page):  [place]  Time of first page:  2224  MD notified (2nd page):  Time of second page:  Responding MD:  [place]   Time MD responded:  2232, Potassium infusing now from earlier order, no new orders at this time.

## 2012-02-09 NOTE — ED Notes (Addendum)
Was released from ED at 2 am, to ED via [place], from home, with continued vomiting. Vomited approx 1500 cc tea colored liquid. Alert, moaning, crying, states abd hurts,

## 2012-02-09 NOTE — ED Notes (Signed)
[id number] Expected date:<BR> Expected time:<BR> Means of arrival:Ambulance<BR> Comments:<BR> Vomiting

## 2012-02-09 NOTE — ED Notes (Signed)
PIV team notified of need for PICC placement and infiltration of PIV

## 2012-02-09 NOTE — ED Notes (Signed)
Patient transported to IR for PICC placement

## 2012-02-09 NOTE — Procedures (Signed)
US/fluoroscopic guided right DL brachial vein PICC placed. Length 38 cm. Tip SVC/RA junction. No immediate complications.

## 2012-02-09 NOTE — H&P (Signed)
[facility] History and Physical  [name] [id number] DOB: March 22, 1971 DOA: 02/09/2012  Referring physician: ER physician PCP: [name], MD   Chief Complaint: vomiting  HPI:  41 year old female with multiple medical co morbidities who presented to ED with complaints of intractable nausea and vomiting start few days prior to admission. Patient reports she has gotten no relief with home antiemetics and could not keep solid or liquid down. No associated abdominal pain, no fever or chills. No chest pain, no shortness of breath and no cough, NO reports of blood in stool or urine.  Review of Systems:   Constitutional: Negative for fever, chills and malaise/fatigue. Negative for diaphoresis.  HENT: Negative for hearing loss, ear pain, nosebleeds, congestion, sore throat, neck pain, tinnitus and ear discharge.   Eyes: Negative for blurred vision, double vision, photophobia, pain, discharge and redness.  Respiratory: Negative for cough, hemoptysis, sputum production, shortness of breath, wheezing and stridor.   Cardiovascular: Negative for chest pain, palpitations, orthopnea, claudication and leg swelling.  Gastrointestinal: per HPI Genitourinary: Negative for dysuria, urgency, frequency, hematuria and flank pain.  Musculoskeletal: Negative for myalgias, back pain, joint pain and falls.  Skin: Negative for itching and rash.  Neurological: Negative for dizziness and weakness. Negative for tingling, tremors, sensory change, speech change, focal weakness, loss of consciousness and headaches.  Endo/Heme/Allergies: Negative for environmental allergies and polydipsia. Does not bruise/bleed easily.  Psychiatric/Behavioral: Negative for suicidal ideas. The patient is not nervous/anxious.      Past Medical History  Diagnosis Date  . Hypertension   . Diabetes mellitus   . Gastroparesis   . Asthma   . GERD (gastroesophageal reflux disease)   . Coronary artery disease   . h/o seizure    . Neuropathy   . Chronic pancreatitis   . Dyslipidemia   . MI (myocardial infarction)   . COPD (chronic obstructive pulmonary disease)    Past Surgical History  Procedure Date  . Abdominal hysterectomy   . Back surgery   . Cholecystectomy   . Peg tube x 4   . Cesarean section    Social History:  reports that she has been smoking Cigarettes.  She has a .5 pack-year smoking history. She has never used smokeless tobacco. She reports that she does not drink alcohol or use illicit drugs.  Allergies  Allergen Reactions  . Compazine Hives  . Darvocet (Propoxyphene-Acetaminophen) Other (See Comments)    UNKNOWN  . Nsaids Other (See Comments)    UNKNOWN  . Penicillins Hives  . Zofran Hives    Family History  Problem Relation Age of Onset  . Cancer Mother     Prior to Admission medications   Medication Sig Start Date End Date Taking? Authorizing Provider  aspirin 81 MG EC tablet Take 1 tablet (81 mg total) by mouth daily. 12/02/11 12/01/12 Yes Robbie Lis, MD  atorvastatin (LIPITOR) 40 MG tablet Take 1 tablet (40 mg total) by mouth daily. 12/02/11 12/01/12 Yes Robbie Lis, MD  docusate sodium (COLACE) 100 MG capsule Take 100 mg by mouth 2 (two) times daily.   Yes Historical Provider, MD  esomeprazole (NEXIUM) 40 MG capsule Take 1 capsule (40 mg total) by mouth daily before breakfast. 12/02/11  Yes Robbie Lis, MD  Fluticasone-Salmeterol (ADVAIR) 250-50 MCG/DOSE AEPB Inhale 1 puff into the lungs 2 (two) times daily. 12/02/11  Yes Robbie Lis, MD  gabapentin (NEURONTIN) 600 MG tablet Take 1 tablet (600 mg total) by mouth 3 (three) times  daily. 12/02/11 12/01/12 Yes Robbie Lis, MD  HYDROcodone-acetaminophen [facility]) 5-325 MG per tablet Take 1 tablet by mouth every 6 (six) hours as needed for pain. 02/09/12 02/19/12 Yes Christopher W Lawyer, PA-C  insulin glargine (LANTUS) 100 UNIT/ML injection Inject 10 Units into the skin at bedtime. 12/02/11 12/01/12 Yes Robbie Lis, MD  insulin lispro  (HUMALOG) 100 UNIT/ML injection Inject 10-20 Units into the skin 3 (three) times daily before meals. Sliding scale 12/02/11  Yes Robbie Lis, MD  lisinopril (PRINIVIL) 10 MG tablet Take 2 tablets (20 mg total) by mouth daily. 12/02/11 12/01/12 Yes Robbie Lis, MD  pantoprazole (PROTONIX) 20 MG tablet Take 1 tablet (20 mg total) by mouth daily. 12/02/11 12/01/12 Yes Robbie Lis, MD  pregabalin (LYRICA) 100 MG capsule Take 100 mg by mouth 2 (two) times daily.   Yes Historical Provider, MD  promethazine (PHENERGAN) 25 MG suppository Place 1 suppository (25 mg total) rectally every 6 (six) hours as needed. For nausea/vomiting. 12/02/11  Yes Robbie Lis, MD  promethazine (PHENERGAN) 25 MG tablet Take 1 tablet (25 mg total) by mouth every 6 (six) hours as needed for nausea. 02/09/12 02/16/12 Yes Christopher W Lawyer, PA-C  zolpidem (AMBIEN) 5 MG tablet Take 1 tablet (5 mg total) by mouth at bedtime as needed for sleep (insomnia). 12/02/11 12/01/12 Yes [place] Vivi Ferns, MD  metoCLOPramide (REGLAN) 10 MG tablet Take 1 tablet (10 mg total) by mouth every 6 (six) hours. 11/14/11 11/24/11  Barbara Cower, MD  metoCLOPramide (REGLAN) 10 MG tablet Take 1 tablet (10 mg total) by mouth every 6 (six) hours. 11/24/11 12/04/11  Barbara Cower, MD  metoCLOPramide (REGLAN) 10 MG tablet Take 1 tablet (10 mg total) by mouth every 6 (six) hours. 12/02/11 12/12/11  Robbie Lis, MD  metoprolol succinate (TOPROL XL) 50 MG 24 hr tablet Take 1 tablet (50 mg total) by mouth daily. 12/02/11 12/01/12  Robbie Lis, MD  Multiple Vitamin (MULITIVITAMIN WITH MINERALS) TABS Take 1 tablet by mouth daily. 12/02/11   Robbie Lis, MD   Physical Exam: Filed Vitals:   02/09/12 1525 02/09/12 1530 02/09/12 1545 02/09/12 1600  BP: 183/109 172/109    Pulse: 105 107 108 102  Temp:      TempSrc:      Resp: 19 21 20 22   SpO2: 98% 99% 99% 99%    Physical Exam  Constitutional: Appears well-developed and well-nourished. No distress.  HENT: Normocephalic.  External right and left ear normal. Oropharynx is clear and moist.  Eyes: Conjunctivae and EOM are normal. PERRLA, no scleral icterus.  Neck: Normal ROM. Neck supple. No JVD. No tracheal deviation. No thyromegaly.  CVS: RRR, S1/S2 +, no murmurs, no gallops, no carotid bruit.  Pulmonary: Effort and breath sounds normal, no stridor, rhonchi, wheezes, rales.  Abdominal: Soft. BS +,  no distension, tenderness, rebound or guarding.  Musculoskeletal: Normal range of motion. No edema and no tenderness.  Lymphadenopathy: No lymphadenopathy noted, cervical, inguinal. Neuro: Alert. Normal reflexes, muscle tone coordination. No cranial nerve deficit. Skin: Skin is warm and dry. No rash noted. Not diaphoretic. No erythema. No pallor.  Psychiatric: Normal mood and affect. Behavior, judgment, thought content normal.   Labs on Admission:  Basic Metabolic Panel:  Lab [id number] 1225 02/08/12 2150  NA 133* 134*  K 3.3* 3.6  CL 93* 96  CO2 22 23  GLUCOSE 320* 343*  BUN 14 14  CREATININE 0.47* 0.53  CALCIUM 9.8 10.1   Liver  Function Tests:  Lab 02/09/12 1225 02/08/12 2150  AST 20 16  ALT 9 9  ALKPHOS 140* 137*  BILITOT 0.3 0.2*  PROT 8.2 8.0  ALBUMIN 3.2* 3.3*    Lab 02/09/12 1225 02/08/12 2150  LIPASE 24 13  AMYLASE -- --   CBC:  Lab 02/09/12 1225 02/08/12 2150  WBC 9.8 7.2  HGB 16.1* 15.2*  HCT 45.6 43.8  MCV 85.7 85.9  PLT 194 202   CBG:  Lab 02/09/12 1210 02/08/12 1911  GLUCAP 326* 277*    Radiological Exams on Admission: No results found.  EKG: Normal sinus rhythm, no ST/T wave changes  Assessment/Plan:   Principal Problem:  *Acute on chronic Abdominal pain with nausea and vomiting secondary to chronic pancreatitis, gastroparesis, and probable cyclic vomiting syndrome  Likely multifactorial.  Narcotic use likely exacerbating symptoms. Encouraged patient to use these sparingly.  Treat nausea with antinausea medicines.  Continue Reglan for gastroparesis to increase  bowel motility.  Lipase levels within normal limits.   Active Problems:  Gastroparesis with nausea and vomiting  No nuclear medicine study in hospital records confirming this diagnosis. Patient states that this was diagnosed at another facility.  Continue Reglan.  HTN (hypertension)  Currently on lisinopril and metoprolol  Blood pressure controlled. Chronic pancreatitis   Lipase levels consistently within normal limits.  Unclear diagnosis. States that this diagnosis was made after she had her gallbladder removed. This was done at outside facility. Dyslipidemia  Currently on Lipitor. GERD (gastroesophageal reflux disease)  Currently on Protonix. DM (diabetes mellitus), type 2, uncontrolled  Hemoglobin A1c 9.7% (goal less than 7%)  Continue home regimen insulin and SSI Hypokalemia  Likely secondary to nausea and vomiting.  Potassium chloride 40 meq x 1 dose ordered  Constipation  Continue Senokot.  Added MiraLAX   UTI (lower urinary tract infection)  continue cipro 400 mg iv daily  Code Status: full code Family Communication: none at bedside Disposition Plan: home when stable  Medical Consultants:  None Antibiotics:  Cipro 02/09/2012 -->  Leisa Lenz, MD  [facility] Pager [phone]  If 7PM-7AM, please contact night-coverage [email or web address] Password [email or web address] 02/09/2012, 5:00 PM

## 2012-02-09 NOTE — ED Notes (Signed)
IV RN at bedside 

## 2012-02-09 NOTE — ED Notes (Signed)
2 RNs attempted to establish IV without success. IV RN called.

## 2012-02-10 DIAGNOSIS — K3184 Gastroparesis: Secondary | ICD-10-CM

## 2012-02-10 DIAGNOSIS — E785 Hyperlipidemia, unspecified: Secondary | ICD-10-CM

## 2012-02-10 DIAGNOSIS — K5900 Constipation, unspecified: Secondary | ICD-10-CM

## 2012-02-10 LAB — CBC
HCT: 37.9 % (ref 36.0–46.0)
MCH: 30.2 pg (ref 26.0–34.0)
MCV: 85.4 fL (ref 78.0–100.0)
Platelets: 173 10*3/uL (ref 150–400)
RBC: 4.44 MIL/uL (ref 3.87–5.11)

## 2012-02-10 LAB — COMPREHENSIVE METABOLIC PANEL
AST: 23 U/L (ref 0–37)
Albumin: 2.7 g/dL — ABNORMAL LOW (ref 3.5–5.2)
Chloride: 97 mEq/L (ref 96–112)
Creatinine, Ser: 0.53 mg/dL (ref 0.50–1.10)
Potassium: 2.9 mEq/L — ABNORMAL LOW (ref 3.5–5.1)
Total Bilirubin: 0.3 mg/dL (ref 0.3–1.2)
Total Protein: 6.6 g/dL (ref 6.0–8.3)

## 2012-02-10 LAB — GLUCOSE, CAPILLARY
Glucose-Capillary: 189 mg/dL — ABNORMAL HIGH (ref 70–99)
Glucose-Capillary: 143 mg/dL — ABNORMAL HIGH (ref 70–99)
Glucose-Capillary: 128 mg/dL — ABNORMAL HIGH (ref 70–99)

## 2012-02-10 LAB — TSH: TSH: 1.415 u[IU]/mL (ref 0.350–4.500)

## 2012-02-10 MED ORDER — SODIUM CHLORIDE 0.9 % IV SOLN
80.0000 mg | Freq: Once | INTRAVENOUS | Status: AC
  Administered 2012-02-10: 80 mg via INTRAVENOUS
  Filled 2012-02-10: qty 80

## 2012-02-10 MED ORDER — HYDROMORPHONE HCL PF 2 MG/ML IJ SOLN
2.0000 mg | INTRAMUSCULAR | Status: DC | PRN
  Administered 2012-02-10 (×2): 2 mg via INTRAVENOUS
  Filled 2012-02-10 (×2): qty 1

## 2012-02-10 MED ORDER — FENTANYL 25 MCG/HR TD PT72
25.0000 ug | MEDICATED_PATCH | TRANSDERMAL | Status: DC
  Administered 2012-02-10 – 2012-02-13 (×2): 25 ug via TRANSDERMAL
  Filled 2012-02-10 (×2): qty 1

## 2012-02-10 MED ORDER — POTASSIUM CHLORIDE 10 MEQ/100ML IV SOLN
10.0000 meq | INTRAVENOUS | Status: AC
  Administered 2012-02-10 (×2): 10 meq via INTRAVENOUS
  Filled 2012-02-10 (×3): qty 100

## 2012-02-10 MED ORDER — HYDROMORPHONE HCL PF 2 MG/ML IJ SOLN
2.0000 mg | INTRAMUSCULAR | Status: DC | PRN
  Administered 2012-02-10 – 2012-02-13 (×29): 2 mg via INTRAVENOUS
  Filled 2012-02-10 (×29): qty 1

## 2012-02-10 MED ORDER — SODIUM CHLORIDE 0.9 % IV SOLN
8.0000 mg/h | INTRAVENOUS | Status: DC
  Administered 2012-02-10 – 2012-02-13 (×7): 8 mg/h via INTRAVENOUS
  Filled 2012-02-10 (×16): qty 80

## 2012-02-10 MED ORDER — HYDROMORPHONE HCL PF 1 MG/ML IJ SOLN
0.5000 mg | Freq: Once | INTRAMUSCULAR | Status: AC
  Administered 2012-02-10: 0.5 mg via INTRAVENOUS
  Filled 2012-02-10: qty 1

## 2012-02-10 MED ORDER — HYDROMORPHONE HCL PF 2 MG/ML IJ SOLN
2.0000 mg | INTRAMUSCULAR | Status: DC | PRN
  Administered 2012-02-10: 2 mg via INTRAVENOUS
  Filled 2012-02-10: qty 1

## 2012-02-10 NOTE — Progress Notes (Signed)
INITIAL ADULT NUTRITION ASSESSMENT Date: 02/10/2012   Time: 6:02 PM Reason for Assessment: Nutrition risk   INTERVENTION: Diet advancement per MD. Recommend SLP evaluation as pt c/o dysphagia. Anti-emetics per MD.   ASSESSMENT: [name]  Dx: Nausea and vomiting  Food/Nutrition Related Hx: Pt admitted with intractable nausea/vomiting that started a few days PTA. Pt reports before then she typically was eating 1 meal/day. Pt c/o throat tightness when she swallows with both liquids and solids. Pt states she still has nausea despite being medicated. Per H&P, pt has history of PEG tube, however unable to gain further information from pt as she kept falling asleep during visit.   Hx:  Past Medical History  Diagnosis Date  . Hypertension   . Diabetes mellitus   . Gastroparesis   . Asthma   . GERD (gastroesophageal reflux disease)   . Coronary artery disease   . h/o seizure   . Neuropathy   . Chronic pancreatitis   . Dyslipidemia   . MI (myocardial infarction)   . COPD (chronic obstructive pulmonary disease)    Related Meds:  Scheduled Meds:   . aspirin EC  81 mg Oral Daily  . atorvastatin  40 mg Oral q1800  . ciprofloxacin  400 mg Intravenous Once  . ciprofloxacin  400 mg Intravenous Q24H  . docusate sodium  100 mg Oral BID  . fentaNYL  25 mcg Transdermal Q72H  . Fluticasone-Salmeterol  1 puff Inhalation BID  . gabapentin  600 mg Oral TID  .  HYDROmorphone (DILAUDID) injection  0.5 mg Intravenous Once  .  HYDROmorphone (DILAUDID) injection  0.5 mg Intravenous Once  . insulin aspart  0-15 Units Subcutaneous TID WC  . insulin aspart  0-5 Units Subcutaneous QHS  . insulin glargine  10 Units Subcutaneous QHS  . labetalol  20 mg Intravenous Once  . lisinopril  20 mg Oral Daily  . metoCLOPramide (REGLAN) injection  10 mg Intravenous Q6H  . metoprolol succinate  50 mg Oral Daily  . multivitamin with minerals  1 tablet Oral Daily  . pantoprazole (PROTONIX) IV  80 mg  Intravenous Once  . potassium chloride  10 mEq Intravenous Q1 Hr x 3  . potassium chloride  10 mEq Intravenous Q1 Hr x 3  . potassium chloride  40 mEq Oral Once  . pregabalin  100 mg Oral BID  . sodium chloride  1,000 mL Intravenous Once  . DISCONTD: aspirin  81 mg Oral Daily  . DISCONTD: gabapentin  600 mg Oral TID  . DISCONTD: pantoprazole  80 mg Oral Q1200  . DISCONTD: promethazine  25 mg Intravenous Once   Continuous Infusions:   . sodium chloride 100 mL/hr at 02/09/12 2104  . pantoprozole (PROTONIX) infusion 8 mg/hr (02/10/12 1510)   PRN Meds:.HYDROmorphone (DILAUDID) injection, oxyCODONE, promethazine, promethazine, zolpidem, DISCONTD:  HYDROmorphone (DILAUDID) injection, DISCONTD:  HYDROmorphone (DILAUDID) injection, DISCONTD:  HYDROmorphone (DILAUDID) injection, DISCONTD:  HYDROmorphone (DILAUDID) injection  Ht: 5\' 5"  (165.1 cm)  Wt: 128 lb 1.4 oz (58.1 kg)  Ideal Wt: 125 lb % Ideal Wt: 102  Usual Wt: 139 lb in may 2013 % Usual Wt: 92  Wt Readings from Last 10 Encounters:  02/09/12 128 lb 1.4 oz (58.1 kg)  12/01/11 139 lb 12.4 oz (63.4 kg)  08/09/11 133 lb 12.8 oz (60.691 kg)  06/12/11 134 lb 11.2 oz (61.1 kg)    Body mass index is 21.31 kg/(m^2).   Labs:  CMP     Component Value Date/Time  NA 139 02/10/2012 0545   K 2.9* 02/10/2012 0545   CL 97 02/10/2012 0545   CO2 32 02/10/2012 0545   GLUCOSE 234* 02/10/2012 0545   BUN 16 02/10/2012 0545   CREATININE 0.53 02/10/2012 0545   CALCIUM 8.8 02/10/2012 0545   PROT 6.6 02/10/2012 0545   ALBUMIN 2.7* 02/10/2012 0545   AST 23 02/10/2012 0545   ALT 8 02/10/2012 0545   ALKPHOS 116 02/10/2012 0545   BILITOT 0.3 02/10/2012 0545   GFRNONAA >90 02/10/2012 0545   GFRAA >90 02/10/2012 0545    Intake/Output Summary (Last 24 hours) at 02/10/12 1805 Last data filed at 02/10/12 1400  Gross per 24 hour  Intake   1350 ml  Output   4500 ml  Net  -3150 ml   Last BM -  PTA  Diet Order: NPO   IVF:    sodium chloride Last  Rate: 100 mL/hr at 02/09/12 2104  pantoprozole (PROTONIX) infusion Last Rate: 8 mg/hr (02/10/12 1510)    Estimated Nutritional Needs:   [id number] Protein:60-70g Fluid:1.4-1.6L  NUTRITION DIAGNOSIS: -Inadequate oral intake (NI-2.1).  Status: Ongoing -Pt meets criteria for severe PCM of acute illness AEB 7.9% weight loss in the past 2 months with <50% energy intake for the past week PTA per H&P r/t nausea/vomiting   RELATED TO: intractable nausea/vomiting  AS EVIDENCE BY: NPO  MONITORING/EVALUATION(Goals): Advance diet as tolerated to low fat, diabetic diet.   EDUCATION NEEDS: -Education not appropriate at this time   Dietitian #: [place] Per approved criteria  -Severe malnutrition in the context of acute illness or injury    Glory Rosebush 02/10/2012, 6:02 PM

## 2012-02-10 NOTE — Progress Notes (Signed)
[facility] PROGRESS NOTE  [name] [id number] DOB: [birth year] DOA: 02/09/2012 PCP: [name], MD  Brief narrative: 41 year old female with multiple medical co morbidities who presented to ED with complaints of intractable nausea and vomiting started few days prior to admission.   Assessment/Plan:   Principal Problem:  *Acute on chronic Abdominal pain with nausea and vomiting secondary to chronic pancreatitis, gastroparesis, and probable cyclic vomiting syndrome  Likely multifactorial. Narcotic use likely exacerbating symptoms. Encouraged patient to use these sparingly.  Treat nausea with antinausea medicines. Phenergan PRN Continue Reglan for gastroparesis to increase bowel motility.  Lipase levels within normal limits.  Pain: dilaudid 3 mg Q 2 hours PRN for severe pain and oxycodone for moderate pain PO PRN  Active Problems:  Gastroparesis with nausea and vomiting  No nuclear medicine study in hospital records confirming this diagnosis. Patient states that this was diagnosed at another facility.  Continue Reglan. Continue phenergan for nausea or vomiting  HTN (hypertension)  Currently on lisinopril and metoprolol  Blood pressure controlled. 128/77 Chronic pancreatitis  Lipase levels consistently within normal limits.  Unclear diagnosis. States that this diagnosis was made after she had her gallbladder removed. This was done at outside facility. Dyslipidemia  Currently on Lipitor. GERD (gastroesophageal reflux disease)  Currently on Protonix. DM (diabetes mellitus), type 2, uncontrolled  Hemoglobin A1c 9.7% (goal less than 7%)  Continue home regimen insulin and SSI Hypokalemia  Likely secondary to nausea and vomiting.  Potassium chloride 10 meq x 3 doses ordered today Follow up BMP in am Constipation  Continue colace UTI (lower urinary tract infection)  continue cipro 400 mg iv daily  Code Status: full code  Family Communication: none at bedside    Disposition Plan: home when stable   Medical Consultants:  None Antibiotics:  Cipro 02/09/2012 -->  Leisa Lenz, MD  [facility] Pager [phone]  If 7PM-7AM, please contact night-coverage [email or web address] Password [facility] 02/10/2012, 11:43 AM   LOS: 1 day   HPI/Subjective: No acute overnight events. Still complains of abdominal pain not relieved with current analgesia.  Objective: Filed Vitals:   02/09/12 1949 02/09/12 2117 02/10/12 0556 02/10/12 0900  BP: 180/100 152/93 128/77   Pulse:  94 105   Temp:  97.9 F (36.6 C) 98.2 F (36.8 C)   TempSrc:  Oral Oral   Resp:  22 18   Height:      Weight:      SpO2:  100% 96% 96%    Intake/Output Summary (Last 24 hours) at 02/10/12 1143 Last data filed at 02/10/12 0646  Gross per 24 hour  Intake   1350 ml  Output   2925 ml  Net  -1575 ml    Exam:   General:  Pt is alert, follows commands appropriately, not in acute distress  Cardiovascular: Regular rate and rhythm, S1/S2, no murmurs, no rubs, no gallops  Respiratory: Clear to auscultation bilaterally, no wheezing, no crackles, no rhonchi  Abdomen: left mid abdomen tenderness with no rebound or guarding; (+) BS  Extremities: No edema, pulses DP and PT palpable bilaterally  Neuro: Grossly nonfocal  Data Reviewed: Basic Metabolic Panel:  Lab [id number] 0545 02/09/12 2000 02/09/12 1225 02/08/12 2150  NA 139 136 133* 134*  K 2.9* 2.7* 3.3* 3.6  CL 97 97 93* 96  CO2 32 29 22 23   GLUCOSE 234* 206* 320* 343*  BUN 16 15 14 14   CREATININE 0.53 0.49* 0.47* 0.53  CALCIUM 8.8 8.7 9.8 10.1   Liver  Function Tests:  Lab 02/10/12 0545 02/09/12 2000 02/09/12 1225 02/08/12 2150  AST 23 19 20 16   ALT 8 7 9 9   ALKPHOS 116 113 140* 137*  BILITOT 0.3 0.2* 0.3 0.2*  PROT 6.6 6.7 8.2 8.0  ALBUMIN 2.7* 2.6* 3.2* 3.3*    Lab 02/09/12 1225 02/08/12 2150  LIPASE 24 13   CBC:  Lab 02/10/12 0545 02/09/12 2000 02/09/12 1225 02/08/12 2150  WBC 10.6* 10.0 9.8 7.2   HGB 13.4 13.5 16.1* 15.2*  HCT 37.9 38.3 45.6 43.8  MCV 85.4 86.1 85.7 85.9  PLT 173 186 194 202   CBG:  Lab 02/10/12 0754 02/09/12 2150 02/09/12 1731 02/09/12 1210 02/08/12 1911  GLUCAP 189* 171* 326* 326* 277*     Studies: Ir Fluoro Guide Cv Line Right 02/10/2012  IMPRESSION: Successful right arm PICC line placement with ultrasound and fluoroscopic guidance.  The catheter is ready for use.   Dg Abd Acute W/chest 02/09/2012   IMPRESSION: Recommend advancing nasogastric tube 8 cm to place the proximal side port in the proximal stomach. Nonobstructive bowel gas pattern though a paucity of bowel gas is noted. Rounded gas collection in the pelvis, question bowel versus air filled urinary bladder; has the patient been recently catheterized?     Scheduled Meds:   . aspirin EC  81 mg Oral Daily  . atorvastatin  40 mg Oral q1800  . ciprofloxacin  400 mg Intravenous Q24H  . docusate sodium  100 mg Oral BID  . fentaNYL  25 mcg Transdermal Q72H  . Fluticasone-Salmeterol  1 puff Inhalation BID  . gabapentin  600 mg Oral TID  . insulin aspart  0-15 Units Subcutaneous TID WC  . insulin aspart  0-5 Units Subcutaneous QHS  . insulin glargine  10 Units Subcutaneous QHS  . labetalol  20 mg Intravenous Once  . lisinopril  20 mg Oral Daily  . metoCLOPramide (REGLAN) injection  10 mg Intravenous Q6H  . metoprolol succinate  50 mg Oral Daily  . multivitamin with minerals  1 tablet Oral Daily  . pantoprazole (PROTONIX) IV  80 mg Intravenous Once  . potassium chloride  10 mEq Intravenous Q1 Hr x 3  . pregabalin  100 mg Oral BID   Continuous Infusions:   . sodium chloride 100 mL/hr at 02/09/12 2104  . pantoprozole (PROTONIX) infusion

## 2012-02-11 LAB — CBC
Hemoglobin: 12.2 g/dL (ref 12.0–15.0)
MCH: 29.8 pg (ref 26.0–34.0)
MCHC: 33.3 g/dL (ref 30.0–36.0)
RDW: 14.3 % (ref 11.5–15.5)

## 2012-02-11 LAB — GLUCOSE, CAPILLARY
Glucose-Capillary: 69 mg/dL — ABNORMAL LOW (ref 70–99)
Glucose-Capillary: 73 mg/dL (ref 70–99)

## 2012-02-11 LAB — BASIC METABOLIC PANEL
Calcium: 8.2 mg/dL — ABNORMAL LOW (ref 8.4–10.5)
GFR calc Af Amer: >90 mL/min (ref >90)
GFR calc non Af Amer: >90 mL/min (ref >90)
Glucose, Bld: 163 mg/dL — ABNORMAL HIGH (ref 70–99)
Sodium: 134 mEq/L — ABNORMAL LOW (ref 135–145)

## 2012-02-11 MED ORDER — POTASSIUM CHLORIDE CRYS ER 20 MEQ PO TBCR
40.0000 meq | EXTENDED_RELEASE_TABLET | Freq: Once | ORAL | Status: AC
  Administered 2012-02-11: 40 meq via ORAL
  Filled 2012-02-11: qty 2

## 2012-02-11 MED ORDER — PROMETHAZINE HCL 25 MG/ML IJ SOLN
12.5000 mg | Freq: Once | INTRAMUSCULAR | Status: AC
  Administered 2012-02-11: 12.5 mg via INTRAVENOUS
  Filled 2012-02-11: qty 1

## 2012-02-11 MED ORDER — PROMETHAZINE HCL 25 MG/ML IJ SOLN
12.5000 mg | INTRAMUSCULAR | Status: DC | PRN
  Administered 2012-02-11 – 2012-02-12 (×8): 12.5 mg via INTRAMUSCULAR
  Filled 2012-02-11 (×7): qty 1

## 2012-02-11 MED ORDER — PHENOL 1.4 % MT LIQD
1.0000 | OROMUCOSAL | Status: DC | PRN
  Administered 2012-02-11: 1 via OROMUCOSAL
  Filled 2012-02-11: qty 177

## 2012-02-11 NOTE — Progress Notes (Addendum)
[facility] PROGRESS NOTE  BRINN BASFORD [id number] DOB: 09/03/1970 DOA: 02/09/2012 PCP: [name], MD  Brief narrative:  41 year old female with multiple medical co morbidities who presented to ED with complaints of intractable nausea and vomiting started few days prior to admission.   Assessment/Plan:   Principal Problem:  *Acute on chronic Abdominal pain with nausea and vomiting secondary to chronic pancreatitis, gastroparesis, and probable cyclic vomiting syndrome  Likely multifactorial. Narcotic use likely exacerbating symptoms. Encouraged patient to use these sparingly.  Treat nausea with antinausea medicines. Phenergan PRN  Continue Reglan for gastroparesis to increase bowel motility.  Lipase levels within normal limits.  Pain: dilaudid 3 mg Q 2 hours PRN for severe pain and oxycodone for moderate pain PO PRN D/C NG tube  Active Problems:  Gastroparesis with nausea and vomiting  No nuclear medicine study in hospital records confirming this diagnosis. Patient states that this was diagnosed at another facility.  Continue Reglan.  Continue phenergan for nausea or vomiting  HTN (hypertension)  Currently on lisinopril and metoprolol  Blood pressure controlled. 127/88 Chronic pancreatitis  Lipase levels consistently within normal limits.  Unclear diagnosis. States that this diagnosis was made after she had her gallbladder removed. This was done at outside facility. Dyslipidemia  Currently on Lipitor. GERD (gastroesophageal reflux disease)  Currently on Protonix. DM (diabetes mellitus), type 2, uncontrolled  Hemoglobin A1c 9.7% (goal less than 7%)  Continue home regimen insulin and SSI Hypokalemia  Likely secondary to nausea and vomiting.  Potassium chloride 10 meq x 3 doses ordered today  Follow up BMP this am Constipation  Continue colace UTI (lower urinary tract infection)  continue cipro 400 mg iv daily  Code Status: full code  Family Communication: none  at bedside  Disposition Plan: home when stable   Medical Consultants:  None Antibiotics:  Cipro 02/09/2012 --> Procedures:  gastric tube  Leisa Lenz, MD  [facility] Pager [phone]  If 7PM-7AM, please contact night-coverage [email or web address] Password [facility] 02/11/2012, 8:31 AM   LOS: 2 days   HPI/Subjective: No acute distress  Objective: Filed Vitals:   02/10/12 0900 02/10/12 1425 02/10/12 2121 02/11/12 0539  BP:  142/86 135/82 127/88  Pulse:  89 78 85  Temp:  98.7 F (37.1 C) 98.5 F (36.9 C) 98.3 F (36.8 C)  TempSrc:  Oral Oral Oral  Resp:  19 18 18   Height:      Weight:    126 lb 15.8 oz (57.6 kg)  SpO2: 96% 94% 98% 98%    Intake/Output Summary (Last 24 hours) at 02/11/12 0831 Last data filed at 02/11/12 0725  Gross per 24 hour  Intake      0 ml  Output   4500 ml  Net  -4500 ml    Exam:   General:  Pt is alert, follows commands appropriately, not in acute distress  Cardiovascular: Regular rate and rhythm, S1/S2, no murmurs, no rubs, no gallops  Respiratory: Clear to auscultation bilaterally, no wheezing, no crackles, no rhonchi  Abdomen: mid abdomen tenderness with no rebound or guarding  Extremities: No edema, pulses DP and PT palpable bilaterally  Neuro: Grossly nonfocal  Data Reviewed: Basic Metabolic Panel:  Lab [id number] 02/09/12 2000 02/09/12 1225 02/08/12 2150  NA 139 136 133* 134*  K 2.9* 2.7* 3.3* 3.6  CL 97 97 93* 96  CO2 32 29 22 23   GLUCOSE 234* 206* 320* 343*  BUN 16 15 14 14   CREATININE 0.53 0.49* 0.47* 0.53  CALCIUM  8.8 8.7 9.8 10.1   Liver Function Tests:  Lab 02/10/12 0545 02/09/12 2000 02/09/12 1225 02/08/12 2150  AST 23 19 20 16   ALT 8 7 9 9   ALKPHOS 116 113 140* 137*  BILITOT 0.3 0.2* 0.3 0.2*  PROT 6.6 6.7 8.2 8.0  ALBUMIN 2.7* 2.6* 3.2* 3.3*    Lab 02/09/12 1225 02/08/12 2150  LIPASE 24 13  AMYLASE -- --   CBC:  Lab 02/10/12 0545 02/09/12 2000 02/09/12 1225 02/08/12 2150  WBC 10.6*  10.0 9.8 7.2  HGB 13.4 13.5 16.1* 15.2*  HCT 37.9 38.3 45.6 43.8  MCV 85.4 86.1 85.7 85.9  PLT 173 186 194 202   CBG:  Lab 02/10/12 2235 02/10/12 1710 02/10/12 1210 02/10/12 0754 02/09/12 2150  GLUCAP 128* 147* 143* 189* 171*     Studies: Ir Fluoro Guide Cv Line Right 02/10/2012  *RADIOLOGY REPORT*  Clinical Data: Nausea  and vomiting, history of chronic pancreatitis; request is made for central venous access  for fluids and medications.  PICC LINE PLACEMENT WITH ULTRASOUND AND FLUOROSCOPIC  GUIDANCE  Fluoroscopy Time: 1.3 minutes.  The right  arm was prepped with chlorhexidine, draped in the usual sterile fashion using maximum barrier technique (cap and mask, sterile gown, sterile gloves, large sterile sheet, hand hygiene and cutaneous antisepsis) and infiltrated locally with 1% Lidocaine.  Ultrasound demonstrated patency of the right brachial vein, and this was documented with an image.  Under real-time ultrasound guidance, this vein was accessed with a 21 gauge micropuncture needle and image documentation was performed.  The needle was exchanged over a guidewire for a peel-away sheath through which a five [place] double lumen PICC trimmed to 38 cm was advanced, positioned with its tip at the lower SVC/right atrial junction. Fluoroscopy during the procedure and fluoro spot radiograph confirms appropriate catheter position.  The catheter was flushed, secured to the skin with Prolene sutures, and covered with a sterile dressing.  Complications:  None  IMPRESSION: Successful right arm PICC line placement with ultrasound and fluoroscopic guidance.  The catheter is ready for use.  Read by: Rowe Robert, P.A.-C  Original Report Authenticated By: Azzie Roup, M.D.   Dg Abd Acute W/chest 02/09/2012  *RADIOLOGY REPORT*  Clinical Data: Vomiting, abdominal pain  ACUTE ABDOMEN SERIES (ABDOMEN 2 VIEW & CHEST 1 VIEW)  Comparison: 01/12/2012  Findings: Nasogastric tube extends into stomach though the  proximal side port is at the distal esophagus. Right arm PICC line, tip projecting over cavoatrial junction. Normal heart size, mediastinal contours, and pulmonary vascularity. Lungs clear. Numerous cardiac monitoring leads project over chest. Prior L4-L5 fusion and L4 laminectomy.  Surgical clips right upper quadrant question cholecystectomy. Metallic foreign body question BB projects over left pelvis. Marked osseous demineralization. Nonobstructive bowel gas pattern. No definite bowel dilatation, bowel wall thickening or free intraperitoneal air. Gas collection identified in pelvis, uncertain if represents bowel or air filled bladder. Anastomotic staple lines from bowel surgery in the left mid abdomen. Single tiny questionable nonobstructing right renal calculus.  IMPRESSION: Recommend advancing nasogastric tube 8 cm to place the proximal side port in the proximal stomach. Nonobstructive bowel gas pattern though a paucity of bowel gas is noted. Rounded gas collection in the pelvis, question bowel versus air filled urinary bladder; has the patient been recently catheterized?  Original Report Authenticated By: Burnetta Sabin, M.D.    Scheduled Meds:   . aspirin EC  81 mg Oral Daily  . atorvastatin  40 mg Oral q1800  .  ciprofloxacin  400 mg Intravenous Q24H  . docusate sodium  100 mg Oral BID  . fentaNYL  25 mcg Transdermal Q72H  . Fluticasone-Salmeterol  1 puff Inhalation BID  . gabapentin  600 mg Oral TID  . insulin aspart  0-15 Units Subcutaneous TID WC  . insulin aspart  0-5 Units Subcutaneous QHS  . insulin glargine  10 Units Subcutaneous QHS  . lisinopril  20 mg Oral Daily  . metoCLOPramide (REGLAN) injection  10 mg Intravenous Q6H  . metoprolol succinate  50 mg Oral Daily  . multivitamin with minerals  1 tablet Oral Daily  . potassium chloride  10 mEq Intravenous Q1 Hr x 3  . potassium chloride  40 mEq Oral Once  . pregabalin  100 mg Oral BID  . promethazine  12.5 mg Intravenous Once    Continuous Infusions:   . sodium chloride 100 mL/hr at 02/09/12 2104  . pantoprozole (PROTONIX) infusion 8 mg/hr (02/11/12 0202)

## 2012-02-12 DIAGNOSIS — E860 Dehydration: Secondary | ICD-10-CM

## 2012-02-12 LAB — GLUCOSE, CAPILLARY
Glucose-Capillary: 70 mg/dL (ref 70–99)
Glucose-Capillary: 77 mg/dL (ref 70–99)

## 2012-02-12 LAB — BASIC METABOLIC PANEL
Calcium: 7.7 mg/dL — ABNORMAL LOW (ref 8.4–10.5)
Chloride: 99 mEq/L (ref 96–112)
Creatinine, Ser: 0.53 mg/dL (ref 0.50–1.10)
GFR calc Af Amer: >90 mL/min (ref >90)
Sodium: 133 mEq/L — ABNORMAL LOW (ref 135–145)

## 2012-02-12 MED ORDER — OXYCODONE HCL 5 MG PO TABS
10.0000 mg | ORAL_TABLET | ORAL | Status: DC | PRN
  Administered 2012-02-12 – 2012-02-13 (×3): 10 mg via ORAL
  Filled 2012-02-12 (×3): qty 2

## 2012-02-12 MED ORDER — POTASSIUM CHLORIDE 10 MEQ/50ML IV SOLN
10.0000 meq | INTRAVENOUS | Status: AC
  Administered 2012-02-12 (×4): 10 meq via INTRAVENOUS
  Filled 2012-02-12 (×4): qty 50

## 2012-02-12 MED ORDER — POTASSIUM CHLORIDE CRYS ER 20 MEQ PO TBCR
40.0000 meq | EXTENDED_RELEASE_TABLET | Freq: Once | ORAL | Status: DC
  Filled 2012-02-12: qty 2

## 2012-02-12 NOTE — Plan of Care (Addendum)
Problem: Phase II Progression Outcomes Goal: Progress activity as tolerated unless otherwise ordered Outcome: Progressing Pt experiencing a lot of abdominal pain only ambulating to the bathroom. Has difficulty swallowing pills. Heat applied to her abdomen, with some relief. Sipping ginger ale. States she has no appetite.Protonix drip infusing at 25/hr. Sleeping at present. KCL runs are behind schedule, so her Cipro dose will need to be readjusted.[name]'s 0800 CBG-70,1200-CBG-81,1654-CBG54. Given ginger ale Jello. 1715-CBG-60, given another jello,@ 1750 CBG-77. Pt denied having any symptoms of hypoglycemia.

## 2012-02-12 NOTE — Progress Notes (Signed)
[facility] PROGRESS NOTE  Martha SANDERSON [id number] DOB: 08-01-1971 DOA: 02/09/2012 PCP: [name], MD  Brief narrative:  41 year old female with multiple medical co morbidities who presented to ED with complaints of intractable nausea and vomiting started few days prior to admission.   Assessment/Plan:   Principal Problem:  *Acute on chronic Abdominal pain with nausea and vomiting secondary to chronic pancreatitis, gastroparesis, and probable cyclic vomiting syndrome  Likely multifactorial. Narcotic use likely exacerbating symptoms. Encouraged patient to use these sparingly.  Treat nausea with antinausea medicines. Phenergan PRN  Continue Reglan for gastroparesis to increase bowel motility.  Lipase levels within normal limits.  Pain: dilaudid 3 mg Q 2 hours PRN for severe pain and oxycodone for moderate pain PO PRN  D/Ced NG tube  Active Problems:  Gastroparesis with nausea and vomiting  No nuclear medicine study in hospital records confirming this diagnosis. Patient states that this was diagnosed at another facility.  Continue Reglan.  Continue phenergan for nausea or vomiting  HTN (hypertension)  Currently on lisinopril and metoprolol  Blood pressure controlled. Chronic pancreatitis  Lipase levels consistently within normal limits.  Unclear diagnosis. States that this diagnosis was made after she had her gallbladder removed. This was done at outside facility. Dyslipidemia  Currently on Lipitor. GERD (gastroesophageal reflux disease)  Currently on Protonix. DM (diabetes mellitus), type 2, uncontrolled  Hemoglobin A1c 9.7% (goal less than 7%)  Continue home regimen insulin and SSI Hypokalemia  Likely secondary to nausea and vomiting.  Potassium chloride 40 meq supplemented again today Follow up BMP this am Constipation  Continue colace UTI (lower urinary tract infection)  continue cipro 400 mg iv daily  Code Status: full code  Family Communication: none at  bedside  Disposition Plan: home when stable, likely tomorrow am  Medical Consultants:  None Antibiotics:  Cipro 02/09/2012 --> Procedures:   gastric tube removed 02/11/2012   Leisa Lenz, MD  [facility] Pager [phone]  If 7PM-7AM, please contact night-coverage [email or web address] Password [facility] 02/12/2012, 11:43 AM   LOS: 3 days   HPI/Subjective: No acute overnight events.  Objective: Filed Vitals:   02/11/12 2041 02/12/12 0548 02/12/12 0929 02/12/12 1047  BP: 91/55 97/68  111/81  Pulse: 82 85  82  Temp: 98 F (36.7 C) 99 F (37.2 C)    TempSrc: Oral Oral    Resp: 16 16    Height:      Weight:  131 lb 6.3 oz (59.6 kg)    SpO2: 98% 96% 95%     Intake/Output Summary (Last 24 hours) at 02/12/12 1143 Last data filed at 02/12/12 0600  Gross per 24 hour  Intake    666 ml  Output    550 ml  Net    116 ml    Exam:   General:  Pt is alert, follows commands appropriately, not in acute distress  Cardiovascular: Regular rate and rhythm, S1/S2, no murmurs, no rubs, no gallops  Respiratory: Clear to auscultation bilaterally, no wheezing, no crackles, no rhonchi  Abdomen: Soft, non tender, non distended, bowel sounds present, no guarding  Extremities: No edema, pulses DP and PT palpable bilaterally  Neuro: Grossly nonfocal  Data Reviewed: Basic Metabolic Panel:  Lab [id number] 02/11/12 1020 02/10/12 0545 02/09/12 2000 02/09/12 1225  NA 133* 134* 139 136 133*  K 3.0* 3.2* 2.9* 2.7* 3.3*  CL 99 95* 97 97 93*  CO2 27 29 32 29 22  GLUCOSE 69* 163* 234* 206* 320*  BUN 11  14 16 15 14   CREATININE 0.53 0.47* 0.53 0.49* 0.47*  CALCIUM 7.7* 8.2* 8.8 8.7 9.8  MG -- -- -- 1.5 --  PHOS -- -- -- 3.1 --   Liver Function Tests:  Lab 02/10/12 0545 02/09/12 2000 02/09/12 1225 02/08/12 2150  AST 23 19 20 16   ALT 8 7 9 9   ALKPHOS 116 113 140* 137*  BILITOT 0.3 0.2* 0.3 0.2*  PROT 6.6 6.7 8.2 8.0  ALBUMIN 2.7* 2.6* 3.2* 3.3*    Lab 02/09/12 1225  02/08/12 2150  LIPASE 24 13  AMYLASE -- --   No results found for this basename: AMMONIA:5 in the last 168 hours CBC:  Lab 02/11/12 1020 02/10/12 0545 02/09/12 2000 02/09/12 1225 02/08/12 2150  WBC 10.0 10.6* 10.0 9.8 7.2  NEUTROABS -- -- 7.4 -- 5.7  HGB 12.2 13.4 13.5 16.1* 15.2*  HCT 36.6 37.9 38.3 45.6 43.8  MCV 89.3 85.4 86.1 85.7 85.9  PLT 144* 173 186 194 202   Cardiac Enzymes: No results found for this basename: CKTOTAL:5,CKMB:5,CKMBINDEX:5,TROPONINI:5 in the last 168 hours BNP: No components found with this basename: POCBNP:5 CBG:  Lab 02/12/12 0752 02/11/12 2214 02/11/12 2150 02/11/12 1705 02/11/12 1210  GLUCAP 70 73 69* 70 107*    No results found for this or any previous visit (from the past 240 hour(s)).   Studies: No results found.  Scheduled Meds:   . aspirin EC  81 mg Oral Daily  . atorvastatin  40 mg Oral q1800  . ciprofloxacin  400 mg Intravenous Q24H  . docusate sodium  100 mg Oral BID  . fentaNYL  25 mcg Transdermal Q72H  . Fluticasone-Salmeterol  1 puff Inhalation BID  . gabapentin  600 mg Oral TID  . insulin aspart  0-15 Units Subcutaneous TID WC  . insulin aspart  0-5 Units Subcutaneous QHS  . insulin glargine  10 Units Subcutaneous QHS  . lisinopril  20 mg Oral Daily  . metoCLOPramide (REGLAN) injection  10 mg Intravenous Q6H  . metoprolol succinate  50 mg Oral Daily  . multivitamin with minerals  1 tablet Oral Daily  . potassium chloride  40 mEq Oral Once  . pregabalin  100 mg Oral BID  . DISCONTD: potassium chloride  40 mEq Oral Once   Continuous Infusions:   . pantoprozole (PROTONIX) infusion 8 mg/hr (02/12/12 1044)

## 2012-02-13 LAB — GLUCOSE, CAPILLARY: Glucose-Capillary: 59 mg/dL — ABNORMAL LOW (ref 70–99)

## 2012-02-13 MED ORDER — HYDROCODONE-ACETAMINOPHEN 5-325 MG PO TABS: 2.0000 | ORAL_TABLET | Freq: Four times a day (QID) | ORAL | Status: AC | PRN

## 2012-02-13 MED ORDER — HYDROMORPHONE HCL 8 MG PO TABS: 8.0000 mg | ORAL_TABLET | ORAL | Status: AC | PRN

## 2012-02-13 MED ORDER — POTASSIUM CHLORIDE CRYS ER 20 MEQ PO TBCR
40.0000 meq | EXTENDED_RELEASE_TABLET | Freq: Two times a day (BID) | ORAL | Status: DC
  Filled 2012-02-13: qty 2

## 2012-02-13 MED ORDER — FENTANYL 25 MCG/HR TD PT72: 1.0000 | MEDICATED_PATCH | TRANSDERMAL | Status: DC

## 2012-02-13 NOTE — Discharge Summary (Signed)
Physician Discharge Summary  [name] [id number] DOB: 24-Mar-1971 DOA: 02/09/2012  PCP: [name], MD  Admit date: 02/09/2012 Discharge date: 02/13/2012  Discharge Condition: medically stable for discharge home today; patient agrees with d/c plan: RN aware of d/c plan as well   Diet recommendation: heart healthy and carb modified  History of present illness:   41 year old female with multiple medical co morbidities who presented to ED with complaints of intractable nausea and vomiting started few days prior to admission.   Assessment/Plan:   Principal Problem:  *Acute on chronic Abdominal pain with nausea and vomiting secondary to chronic pancreatitis, gastroparesis, and probable cyclic vomiting syndrome  Likely multifactorial. Narcotic use likely exacerbating symptoms. Encouraged patient to use these sparingly.  Treat nausea with antinausea medicines. Feels better today  Continue Reglan for gastroparesis to increase bowel motility.  Lipase levels within normal limits.  Pain: dilaudid and percocet on discharge for severe pain and breakthrough pain respectively  D/Ced NG tube  Active Problems:  Gastroparesis with nausea and vomiting  No nuclear medicine study in hospital records confirming this diagnosis. Patient states that this was diagnosed at another facility.  Continue Reglan.  Continue phenergan for nausea or vomiting  HTN (hypertension)  Currently on lisinopril and metoprolol  Blood pressure controlled. Chronic pancreatitis  Lipase levels consistently within normal limits.  Unclear diagnosis. States that this diagnosis was made after she had her gallbladder removed. This was done at outside facility. Dyslipidemia  Currently on Lipitor. GERD (gastroesophageal reflux disease)  Currently on Protonix. DM (diabetes mellitus), type 2, uncontrolled  Hemoglobin A1c 9.7% (goal less than 7%)  Continue home regimen insulin and SSI Hypokalemia  Likely secondary to  nausea and vomiting.  Potassium chloride 40 meq supplemented again today prior to discharge  Constipation  Continue colace UTI (lower urinary tract infection)  Had cipro 400 mg iv daily for 5 days; may discontinue on discharge   Code Status: full code  Family Communication: none at bedside  Disposition Plan: home today  Medical Consultants:  None Antibiotics:  Cipro 02/09/2012 -->02/13/2012 Procedures:  gastric tube removed 02/11/2012  Discharge Exam: Filed Vitals:   02/13/12 0536  BP: 112/77  Pulse: 80  Temp: 98.1 F (36.7 C)  Resp: 16   Filed Vitals:   02/12/12 1300 02/12/12 2120 02/13/12 0536 02/13/12 0759  BP: 124/84 118/79 112/77   Pulse: 83 50 80   Temp: 98.9 F (37.2 C) 98.2 F (36.8 C) 98.1 F (36.7 C)   TempSrc:  Oral Oral   Resp: 20 16 16    Height:      Weight:      SpO2: 99% 91% 100% 98%    General: Pt is alert, follows commands appropriately, not in acute distress Cardiovascular: Regular rate and rhythm, S1/S2 +, no murmurs, no rubs, no gallops Respiratory: Clear to auscultation bilaterally, no wheezing, no crackles, no rhonchi Abdominal: Soft, non tender, non distended, bowel sounds +, no guarding Extremities: no edema, no cyanosis, pulses palpable bilaterally DP and PT Neuro: Grossly nonfocal  Discharge Instructions  Discharge Orders    Future Orders Please Complete By Expires   Diet - low sodium heart healthy      Increase activity slowly      Call MD for:  persistant nausea and vomiting      Call MD for:  severe uncontrolled pain      Call MD for:  difficulty breathing, headache or visual disturbances      Call MD for:  persistant dizziness or light-headedness        Medication List  As of 02/13/2012 12:16 PM   TAKE these medications         aspirin 81 MG EC tablet   Take 1 tablet (81 mg total) by mouth daily.      atorvastatin 40 MG tablet   Commonly known as: LIPITOR   Take 1 tablet (40 mg total) by mouth daily.      docusate sodium  100 MG capsule   Commonly known as: COLACE   Take 100 mg by mouth 2 (two) times daily.      esomeprazole 40 MG capsule   Commonly known as: NEXIUM   Take 1 capsule (40 mg total) by mouth daily before breakfast.      fentaNYL 25 MCG/HR   Commonly known as: DURAGESIC - dosed mcg/hr   Place 1 patch (25 mcg total) onto the skin every 3 (three) days.      Fluticasone-Salmeterol 250-50 MCG/DOSE Aepb   Commonly known as: ADVAIR   Inhale 1 puff into the lungs 2 (two) times daily.      gabapentin 600 MG tablet   Commonly known as: NEURONTIN   Take 1 tablet (600 mg total) by mouth 3 (three) times daily.      HYDROcodone-acetaminophen 5-325 MG per tablet   Commonly known as: NORCO   Take 2 tablets by mouth every 6 (six) hours as needed for pain (for moderate pain).      HYDROmorphone 8 MG tablet   Commonly known as: DILAUDID   Take 1 tablet (8 mg total) by mouth every 4 (four) hours as needed for pain.      insulin glargine 100 UNIT/ML injection   Commonly known as: LANTUS   Inject 10 Units into the skin at bedtime.      insulin lispro 100 UNIT/ML injection   Commonly known as: HUMALOG   Inject 10-20 Units into the skin 3 (three) times daily before meals. Sliding scale      lisinopril 10 MG tablet   Commonly known as: PRINIVIL,ZESTRIL   Take 2 tablets (20 mg total) by mouth daily.      metoCLOPramide 10 MG tablet   Commonly known as: REGLAN   Take 1 tablet (10 mg total) by mouth every 6 (six) hours.      metoCLOPramide 10 MG tablet   Commonly known as: REGLAN   Take 1 tablet (10 mg total) by mouth every 6 (six) hours.      metoprolol succinate 50 MG 24 hr tablet   Commonly known as: TOPROL-XL   Take 1 tablet (50 mg total) by mouth daily.      multivitamin with minerals Tabs   Take 1 tablet by mouth daily.      pantoprazole 20 MG tablet   Commonly known as: PROTONIX   Take 1 tablet (20 mg total) by mouth daily.      pregabalin 100 MG capsule   Commonly known as: LYRICA    Take 100 mg by mouth 2 (two) times daily.      promethazine 25 MG suppository   Commonly known as: PHENERGAN   Place 1 suppository (25 mg total) rectally every 6 (six) hours as needed. For nausea/vomiting.      promethazine 25 MG tablet   Commonly known as: PHENERGAN   Take 1 tablet (25 mg total) by mouth every 6 (six) hours as needed for nausea.      zolpidem 5 MG tablet  Commonly known as: AMBIEN   Take 1 tablet (5 mg total) by mouth at bedtime as needed for sleep (insomnia).           Follow-up Information    Follow up with [facility], MD in 1 week. (As needed)    Contact information:   [address] Dr. Ollen Barges [phone]           The results of significant diagnostics from this hospitalization (including imaging, microbiology, ancillary and laboratory) are listed below for reference.    Significant Diagnostic Studies: Ir Fluoro Guide Cv Line Right 02/10/2012  *RADIOLOGY REPORT*  Clinical Data: Nausea  and vomiting, history of chronic pancreatitis; request is made for central venous access  for fluids and medications.  PICC LINE PLACEMENT WITH ULTRASOUND AND FLUOROSCOPIC  GUIDANCE  Fluoroscopy Time: 1.3 minutes.  The right  arm was prepped with chlorhexidine, draped in the usual sterile fashion using maximum barrier technique (cap and mask, sterile gown, sterile gloves, large sterile sheet, hand hygiene and cutaneous antisepsis) and infiltrated locally with 1% Lidocaine.  Ultrasound demonstrated patency of the right brachial vein, and this was documented with an image.  Under real-time ultrasound guidance, this vein was accessed with a 21 gauge micropuncture needle and image documentation was performed.  The needle was exchanged over a guidewire for a peel-away sheath through which a five [place] double lumen PICC trimmed to 38 cm was advanced, positioned with its tip at the lower SVC/right atrial junction. Fluoroscopy during the procedure and  fluoro spot radiograph confirms appropriate catheter position.  The catheter was flushed, secured to the skin with Prolene sutures, and covered with a sterile dressing.  Complications:  None  IMPRESSION: Successful right arm PICC line placement with ultrasound and fluoroscopic guidance.  The catheter is ready for use.    Dg Abd Acute W/chest 02/09/2012  *RADIOLOGY REPORT*  Clinical Data: Vomiting, abdominal pain  ACUTE ABDOMEN SERIES (ABDOMEN 2 VIEW & CHEST 1 VIEW)  Comparison: 01/12/2012  Findings: Nasogastric tube extends into stomach though the proximal side port is at the distal esophagus. Right arm PICC line, tip projecting over cavoatrial junction. Normal heart size, mediastinal contours, and pulmonary vascularity. Lungs clear. Numerous cardiac monitoring leads project over chest. Prior L4-L5 fusion and L4 laminectomy.  Surgical clips right upper quadrant question cholecystectomy. Metallic foreign body question BB projects over left pelvis. Marked osseous demineralization. Nonobstructive bowel gas pattern. No definite bowel dilatation, bowel wall thickening or free intraperitoneal air. Gas collection identified in pelvis, uncertain if represents bowel or air filled bladder. Anastomotic staple lines from bowel surgery in the left mid abdomen. Single tiny questionable nonobstructing right renal calculus.  IMPRESSION: Recommend advancing nasogastric tube 8 cm to place the proximal side port in the proximal stomach. Nonobstructive bowel gas pattern though a paucity of bowel gas is noted. Rounded gas collection in the pelvis, question bowel versus air filled urinary bladder; has the patient been recently catheterized?     Microbiology: No results found for this or any previous visit (from the past 240 hour(s)).   Labs: Basic Metabolic Panel:  Lab [id number] 02/11/12 1020 02/10/12 0545 02/09/12 2000 02/09/12 1225  NA 133* 134* 139 136 133*  K 3.0* 3.2* 2.9* 2.7* 3.3*  CL 99 95* 97 97 93*  CO2 27 29  32 29 22  GLUCOSE 69* 163* 234* 206* 320*  BUN 11 14 16 15 14   CREATININE 0.53 0.47* 0.53 0.49* 0.47*  CALCIUM 7.7* 8.2* 8.8 8.7 9.8  MG -- -- -- 1.5 --  PHOS -- -- -- 3.1 --   Liver Function Tests:  Lab 02/10/12 0545 02/09/12 2000 02/09/12 1225 02/08/12 2150  AST 23 19 20 16   ALT 8 7 9 9   ALKPHOS 116 113 140* 137*  BILITOT 0.3 0.2* 0.3 0.2*  PROT 6.6 6.7 8.2 8.0  ALBUMIN 2.7* 2.6* 3.2* 3.3*    Lab 02/09/12 1225 02/08/12 2150  LIPASE 24 13  AMYLASE -- --   No results found for this basename: AMMONIA:5 in the last 168 hours CBC:  Lab 02/11/12 1020 02/10/12 0545 02/09/12 2000 02/09/12 1225 02/08/12 2150  WBC 10.0 10.6* 10.0 9.8 7.2  NEUTROABS -- -- 7.4 -- 5.7  HGB 12.2 13.4 13.5 16.1* 15.2*  HCT 36.6 37.9 38.3 45.6 43.8  MCV 89.3 85.4 86.1 85.7 85.9  PLT 144* 173 186 194 202   CBG:  Lab 02/13/12 0752 02/13/12 0714 02/12/12 2219 02/12/12 1750 02/12/12 1715  GLUCAP 104* 59* 154* 77 60*    Time coordinating discharge: Over 30 minutes  Signed:  Leisa Lenz, MD  [facility] 02/13/2012, 12:16 PM  Pager #: [phone]

## 2012-02-13 NOTE — Progress Notes (Signed)
Inpatient Diabetes Program Recommendations  AACE/ADA: New Consensus Statement on Inpatient Glycemic Control (2009)  Target Ranges:  Prepandial:   less than 140 mg/dL      Peak postprandial:   less than 180 mg/dL (1-2 hours)      Critically ill patients:  140 - 180 mg/dL   Reason for Visit: Hypoglycemia and Elevated HgbA1C  Results for KEYIANA, NAPORA (MRN [id number]) as of 02/13/2012 11:30  Ref. Range 12/01/2011 04:15  Hemoglobin A1C Latest Range: <5.7 % 9.7 (H)    Results for TEMAKA, [facility] (MRN [id number]) as of 02/13/2012 11:30  Ref. Range 02/11/2012 17:05 02/11/2012 21:50 02/11/2012 22:14 02/12/2012 07:52 02/12/2012 12:01 02/12/2012 16:54 02/12/2012 17:15 02/12/2012 17:50 02/12/2012 22:19 02/13/2012 07:14 02/13/2012 07:52  Glucose-Capillary Latest Range: 70-99 mg/dL 70 69 (L) 73 70 81 54 (L) 60 (L) 77 154 (H) 59 (L) 104 (H)    Recommendations:  Decrease Lantus to 8 units QHS and decrease Novolog to sensitive while on CL diet.    Needs to f/u with PCP for adjustments with insulin - for elevated HgbA1C.  Will continue to follow.

## 2012-02-13 NOTE — Progress Notes (Signed)
DC to home. To car by WC. No change from am assessment.[name]

## 2012-02-13 NOTE — Progress Notes (Signed)
Encouraged [name] to switch to oral medications for pain management as she is planning to DC home today.  She stated that her doctor lets her stay on IV meds until she discharges then gives her prescription for pills.  Seems upset that I encouraged this, also that tech put powder on her bed while she was bathing-she perceived this as offensive although tech had intended it as comfort measure.  Will continue to encourage her to discuss these issues with staff as they arise.

## 2012-02-13 NOTE — Progress Notes (Signed)
CBG 59  Treatment: 15 GM carbohydrate snack  Symptoms: None  Follow-up CBG: Time:0745 CBG Result:107  Possible Reasons for Event: Unknown  Comments/MD notified: Hypoglycemic protocol followed.    Harlene Ramus

## 2012-02-14 NOTE — ED Provider Notes (Signed)
History     CSN: [id number]  Arrival date & time 02/08/12  1801   First MD Initiated Contact with Patient 02/08/12 2038      Chief Complaint  Patient presents with  . Hematemesis    (Consider location/radiation/quality/duration/timing/severity/associated sxs/prior treatment) HPI The patient presents to the ER with N/V. The patient states that she may have had a little blood mixed into the vomit. The patient states that she has not had CP, SOB, weakness, dizziness, headache, back pain, vaginal bleeding, vaginal discharge, or fever. The patient is having pain in the periumbilical region. Past Medical History  Diagnosis Date  . Hypertension   . Diabetes mellitus   . Gastroparesis   . Asthma   . GERD (gastroesophageal reflux disease)   . Coronary artery disease   . h/o seizure   . Neuropathy   . Chronic pancreatitis   . Dyslipidemia   . MI (myocardial infarction)   . COPD (chronic obstructive pulmonary disease)     Past Surgical History  Procedure Date  . Abdominal hysterectomy   . Back surgery   . Cholecystectomy   . Peg tube x 4   . Cesarean section     Family History  Problem Relation Age of Onset  . Cancer Mother     History  Substance Use Topics  . Smoking status: Current Everyday Smoker -- 0.2 packs/day for 2 years    Types: Cigarettes  . Smokeless tobacco: Never Used  . Alcohol Use: No     hx 1 beer daily -pt states she quit Dec. 2012    OB History    Grav Para Term Preterm Abortions TAB SAB Ect Mult Living                  Review of Systems All other systems negative except as documented in the HPI. All pertinent positives and negatives as reviewed in the HPI.  Allergies  Compazine; Darvocet; Nsaids; Penicillins; and Zofran  Home Medications   Current Outpatient Rx  Name Route Sig Dispense Refill  . ASPIRIN 81 MG PO TBEC Oral Take 1 tablet (81 mg total) by mouth daily. 30 tablet 0  . ATORVASTATIN CALCIUM 40 MG PO TABS Oral Take 1 tablet (40  mg total) by mouth daily. 30 tablet 0  . DOCUSATE SODIUM 100 MG PO CAPS Oral Take 100 mg by mouth 2 (two) times daily.    Marland Kitchen ESOMEPRAZOLE MAGNESIUM 40 MG PO CPDR Oral Take 1 capsule (40 mg total) by mouth daily before breakfast. 30 capsule 0  . FLUTICASONE-SALMETEROL 250-50 MCG/DOSE IN AEPB Inhalation Inhale 1 puff into the lungs 2 (two) times daily. 60 each 0  . GABAPENTIN 600 MG PO TABS Oral Take 1 tablet (600 mg total) by mouth 3 (three) times daily. 90 tablet 0  . INSULIN GLARGINE 100 UNIT/ML [place] SOLN Subcutaneous Inject 10 Units into the skin at bedtime. 3 mL 4    Please give pen  . INSULIN LISPRO (HUMAN) 100 UNIT/ML [place] SOLN Subcutaneous Inject 10-20 Units into the skin 3 (three) times daily before meals. Sliding scale 10 mL 2  . LISINOPRIL 10 MG PO TABS Oral Take 2 tablets (20 mg total) by mouth daily. 60 tablet 0  . METOPROLOL SUCCINATE ER 50 MG PO TB24 Oral Take 1 tablet (50 mg total) by mouth daily. 30 tablet 0  . ADULT MULTIVITAMIN W/MINERALS CH Oral Take 1 tablet by mouth daily. 30 tablet 0  . PANTOPRAZOLE SODIUM 20 MG PO TBEC Oral  Take 1 tablet (20 mg total) by mouth daily. 30 tablet 0  . PREGABALIN 100 MG PO CAPS Oral Take 100 mg by mouth 2 (two) times daily.    Marland Kitchen PROMETHAZINE HCL 25 MG RE SUPP Rectal Place 1 suppository (25 mg total) rectally every 6 (six) hours as needed. For nausea/vomiting. 12 each 0  . ZOLPIDEM TARTRATE 5 MG PO TABS Oral Take 1 tablet (5 mg total) by mouth at bedtime as needed for sleep (insomnia). 30 tablet 0  . FENTANYL 25 MCG/HR TD PT72 Transdermal Place 1 patch (25 mcg total) onto the skin every 3 (three) days. 5 patch 0  . HYDROCODONE-ACETAMINOPHEN 5-325 MG PO TABS Oral Take 2 tablets by mouth every 6 (six) hours as needed for pain (for moderate pain). 45 tablet 0  . HYDROMORPHONE HCL 8 MG PO TABS Oral Take 1 tablet (8 mg total) by mouth every 4 (four) hours as needed for pain. 30 tablet 0  . METOCLOPRAMIDE HCL 10 MG PO TABS Oral Take 1 tablet (10 mg total) by  mouth every 6 (six) hours. 30 tablet 0  . METOCLOPRAMIDE HCL 10 MG PO TABS Oral Take 1 tablet (10 mg total) by mouth every 6 (six) hours. 30 tablet 0  . METOCLOPRAMIDE HCL 10 MG PO TABS Oral Take 1 tablet (10 mg total) by mouth every 6 (six) hours. 30 tablet 0  . PROMETHAZINE HCL 25 MG PO TABS Oral Take 1 tablet (25 mg total) by mouth every 6 (six) hours as needed for nausea. 10 tablet 0    BP 158/91  Pulse 98  Temp 98.8 F (37.1 C) (Oral)  Resp 18  SpO2 100%  Physical Exam  Nursing note and vitals reviewed. Constitutional: She is oriented to person, place, and time. She appears well-developed and well-nourished.  HENT:  Mouth/Throat: Oropharynx is clear and moist.  Cardiovascular: Normal rate, regular rhythm and normal heart sounds.  Exam reveals no gallop and no friction rub.   No murmur heard. Pulmonary/Chest: Effort normal and breath sounds normal.  Abdominal: Soft. Normal appearance and bowel sounds are normal. There is no rigidity, no rebound, no guarding, no tenderness at McBurney's point and negative Murphy's sign.    Neurological: She is alert and oriented to person, place, and time.    ED Course  Procedures (including critical care time)  Labs Reviewed  CBC WITH DIFFERENTIAL - Abnormal; Notable for the following:    Hemoglobin 15.2 (*)     Neutrophils Relative 79 (*)     Monocytes Relative 2 (*)     All other components within normal limits  COMPREHENSIVE METABOLIC PANEL - Abnormal; Notable for the following:    Sodium 134 (*)     Glucose, Bld 343 (*)     Albumin 3.3 (*)     Alkaline Phosphatase 137 (*)     Total Bilirubin 0.2 (*)     All other components within normal limits  GLUCOSE, CAPILLARY - Abnormal; Notable for the following:    Glucose-Capillary 277 (*)     All other components within normal limits  LIPASE, BLOOD  LAB REPORT - SCANNED     1. Abdominal pain, periumbilical    Patient is advised that this could be an evolving process and to return  here for any changes or worsening in her condition. Follow up with her doctor for a recheck.    MDM  MDM Reviewed: vitals, nursing note and previous chart Interpretation: labs  Brent General, PA-C 02/14/12 0136

## 2012-02-15 NOTE — ED Provider Notes (Signed)
Medical screening examination/treatment/procedure(s) were performed by non-physician practitioner and as supervising physician I was immediately available for consultation/collaboration.    Kathalene Frames, MD 02/15/12 [phone]

## 2012-02-25 ENCOUNTER — Encounter (HOSPITAL_COMMUNITY): Payer: Self-pay | Admitting: Emergency Medicine

## 2012-02-25 ENCOUNTER — Inpatient Hospital Stay (HOSPITAL_COMMUNITY)
Admission: EM | Admit: 2012-02-25 | Discharge: 2012-03-14 | DRG: 074 | Disposition: A | Discharge status: Home / Self Care | Payer: PRIVATE HEALTH INSURANCE | Attending: Internal Medicine | Admitting: Internal Medicine

## 2012-02-25 ENCOUNTER — Emergency Department (HOSPITAL_COMMUNITY): Payer: PRIVATE HEALTH INSURANCE

## 2012-02-25 DIAGNOSIS — K5900 Constipation, unspecified: Secondary | ICD-10-CM

## 2012-02-25 DIAGNOSIS — F17200 Nicotine dependence, unspecified, uncomplicated: Secondary | ICD-10-CM | POA: Diagnosis present

## 2012-02-25 DIAGNOSIS — K3184 Gastroparesis: Secondary | ICD-10-CM | POA: Diagnosis present

## 2012-02-25 DIAGNOSIS — T40605A Adverse effect of unspecified narcotics, initial encounter: Secondary | ICD-10-CM | POA: Diagnosis present

## 2012-02-25 DIAGNOSIS — R109 Unspecified abdominal pain: Secondary | ICD-10-CM | POA: Diagnosis present

## 2012-02-25 DIAGNOSIS — K861 Other chronic pancreatitis: Secondary | ICD-10-CM | POA: Diagnosis present

## 2012-02-25 DIAGNOSIS — K567 Ileus, unspecified: Secondary | ICD-10-CM | POA: Diagnosis present

## 2012-02-25 DIAGNOSIS — E876 Hypokalemia: Secondary | ICD-10-CM | POA: Diagnosis present

## 2012-02-25 DIAGNOSIS — J4489 Other specified chronic obstructive pulmonary disease: Secondary | ICD-10-CM | POA: Diagnosis present

## 2012-02-25 DIAGNOSIS — Z79899 Other long term (current) drug therapy: Secondary | ICD-10-CM

## 2012-02-25 DIAGNOSIS — K560 Paralytic ileus: Secondary | ICD-10-CM | POA: Diagnosis present

## 2012-02-25 DIAGNOSIS — E785 Hyperlipidemia, unspecified: Secondary | ICD-10-CM | POA: Diagnosis present

## 2012-02-25 DIAGNOSIS — I10 Essential (primary) hypertension: Secondary | ICD-10-CM

## 2012-02-25 DIAGNOSIS — J449 Chronic obstructive pulmonary disease, unspecified: Secondary | ICD-10-CM | POA: Diagnosis present

## 2012-02-25 DIAGNOSIS — E1165 Type 2 diabetes mellitus with hyperglycemia: Secondary | ICD-10-CM

## 2012-02-25 DIAGNOSIS — I2510 Atherosclerotic heart disease of native coronary artery without angina pectoris: Secondary | ICD-10-CM | POA: Diagnosis present

## 2012-02-25 DIAGNOSIS — I252 Old myocardial infarction: Secondary | ICD-10-CM

## 2012-02-25 DIAGNOSIS — E860 Dehydration: Secondary | ICD-10-CM | POA: Diagnosis present

## 2012-02-25 DIAGNOSIS — K219 Gastro-esophageal reflux disease without esophagitis: Secondary | ICD-10-CM

## 2012-02-25 DIAGNOSIS — D62 Acute posthemorrhagic anemia: Secondary | ICD-10-CM | POA: Diagnosis present

## 2012-02-25 DIAGNOSIS — E1142 Type 2 diabetes mellitus with diabetic polyneuropathy: Secondary | ICD-10-CM | POA: Diagnosis present

## 2012-02-25 DIAGNOSIS — E1149 Type 2 diabetes mellitus with other diabetic neurological complication: Principal | ICD-10-CM | POA: Diagnosis present

## 2012-02-25 DIAGNOSIS — Z794 Long term (current) use of insulin: Secondary | ICD-10-CM

## 2012-02-25 DIAGNOSIS — G8929 Other chronic pain: Secondary | ICD-10-CM

## 2012-02-25 DIAGNOSIS — K209 Esophagitis, unspecified without bleeding: Secondary | ICD-10-CM | POA: Diagnosis present

## 2012-02-25 DIAGNOSIS — R112 Nausea with vomiting, unspecified: Secondary | ICD-10-CM

## 2012-02-25 LAB — URINE MICROSCOPIC-ADD ON

## 2012-02-25 LAB — CBC WITH DIFFERENTIAL/PLATELET
Basophils Absolute: 0 10*3/uL (ref 0.0–0.1)
Basophils Relative: 0 % (ref 0–1)
Eosinophils Absolute: 0 10*3/uL (ref 0.0–0.7)
Eosinophils Relative: 0 % (ref 0–5)
HCT: 38.4 % (ref 36.0–46.0)
Hemoglobin: 13.6 g/dL (ref 12.0–15.0)
Lymphocytes Relative: 21 % (ref 12–46)
Lymphs Abs: 2.3 10*3/uL (ref 0.7–4.0)
MCH: 30 pg (ref 26.0–34.0)
MCHC: 35.4 g/dL (ref 30.0–36.0)
MCV: 84.6 fL (ref 78.0–100.0)
Monocytes Absolute: 0.6 10*3/uL (ref 0.1–1.0)
Monocytes Relative: 5 % (ref 3–12)
Neutro Abs: 8.4 10*3/uL — ABNORMAL HIGH (ref 1.7–7.7)
Neutrophils Relative %: 74 % (ref 43–77)
Platelets: 298 10*3/uL (ref 150–400)
RBC: 4.54 MIL/uL (ref 3.87–5.11)
RDW: 14.1 % (ref 11.5–15.5)
WBC: 11.3 10*3/uL — ABNORMAL HIGH (ref 4.0–10.5)

## 2012-02-25 LAB — GLUCOSE, CAPILLARY: Glucose-Capillary: 311 mg/dL — ABNORMAL HIGH (ref 70–99)

## 2012-02-25 LAB — COMPREHENSIVE METABOLIC PANEL
ALT: 9 U/L (ref 0–35)
AST: 23 U/L (ref 0–37)
Albumin: 3.1 g/dL — ABNORMAL LOW (ref 3.5–5.2)
Alkaline Phosphatase: 102 U/L (ref 39–117)
BUN: 19 mg/dL (ref 6–23)
CO2: 34 mEq/L — ABNORMAL HIGH (ref 19–32)
Calcium: 9.3 mg/dL (ref 8.4–10.5)
Chloride: 87 mEq/L — ABNORMAL LOW (ref 96–112)
Creatinine, Ser: 0.87 mg/dL (ref 0.50–1.10)
GFR calc Af Amer: >90 mL/min (ref >90)
GFR calc non Af Amer: 81 mL/min — ABNORMAL LOW (ref >90)
Glucose, Bld: 284 mg/dL — ABNORMAL HIGH (ref 70–99)
Potassium: 2.7 mEq/L — CL (ref 3.5–5.1)
Sodium: 132 mEq/L — ABNORMAL LOW (ref 135–145)
Total Bilirubin: 0.2 mg/dL — ABNORMAL LOW (ref 0.3–1.2)
Total Protein: 7.3 g/dL (ref 6.0–8.3)

## 2012-02-25 LAB — LACTIC ACID, PLASMA: Lactic Acid, Venous: 1.1 mmol/L (ref 0.5–2.2)

## 2012-02-25 LAB — URINALYSIS, ROUTINE W REFLEX MICROSCOPIC
Bilirubin Urine: NEGATIVE
Ketones, ur: NEGATIVE mg/dL
Nitrite: NEGATIVE
Specific Gravity, Urine: 1.017 (ref 1.005–1.030)
Urobilinogen, UA: 0.2 mg/dL (ref 0.0–1.0)

## 2012-02-25 LAB — LIPASE, BLOOD: Lipase: 13 U/L (ref 11–59)

## 2012-02-25 MED ORDER — FLUTICASONE-SALMETEROL 250-50 MCG/DOSE IN AEPB
1.0000 | INHALATION_SPRAY | Freq: Two times a day (BID) | RESPIRATORY_TRACT | Status: DC
  Administered 2012-02-26 – 2012-03-14 (×29): 1 via RESPIRATORY_TRACT
  Filled 2012-02-25 (×2): qty 14

## 2012-02-25 MED ORDER — PANTOPRAZOLE SODIUM 40 MG IV SOLR
40.0000 mg | INTRAVENOUS | Status: DC
  Administered 2012-02-25: 40 mg via INTRAVENOUS
  Filled 2012-02-25 (×2): qty 40

## 2012-02-25 MED ORDER — LISINOPRIL 20 MG PO TABS
20.0000 mg | ORAL_TABLET | Freq: Every day | ORAL | Status: DC
  Administered 2012-02-25 – 2012-03-03 (×7): 20 mg via ORAL
  Filled 2012-02-25 (×8): qty 1

## 2012-02-25 MED ORDER — FENTANYL 25 MCG/HR TD PT72
25.0000 ug | MEDICATED_PATCH | TRANSDERMAL | Status: DC
  Administered 2012-02-28 – 2012-03-14 (×6): 25 ug via TRANSDERMAL
  Filled 2012-02-25 (×6): qty 1

## 2012-02-25 MED ORDER — ZOLPIDEM TARTRATE 5 MG PO TABS
5.0000 mg | ORAL_TABLET | Freq: Every evening | ORAL | Status: DC | PRN
  Administered 2012-03-02 – 2012-03-13 (×5): 5 mg via ORAL
  Filled 2012-02-25 (×5): qty 1

## 2012-02-25 MED ORDER — HYDROMORPHONE HCL PF 1 MG/ML IJ SOLN
1.0000 mg | INTRAMUSCULAR | Status: DC | PRN
  Administered 2012-02-25 – 2012-02-26 (×7): 1 mg via INTRAVENOUS
  Filled 2012-02-25 (×7): qty 1

## 2012-02-25 MED ORDER — HYDROMORPHONE HCL PF 2 MG/ML IJ SOLN
2.0000 mg | Freq: Once | INTRAMUSCULAR | Status: AC
  Administered 2012-02-25: 2 mg via INTRAMUSCULAR
  Filled 2012-02-25: qty 1

## 2012-02-25 MED ORDER — PROMETHAZINE HCL 25 MG/ML IJ SOLN
25.0000 mg | Freq: Once | INTRAMUSCULAR | Status: AC
  Administered 2012-02-25: 25 mg via INTRAMUSCULAR
  Filled 2012-02-25: qty 1

## 2012-02-25 MED ORDER — SODIUM CHLORIDE 0.9 % IV BOLUS (SEPSIS)
1000.0000 mL | Freq: Once | INTRAVENOUS | Status: AC
  Administered 2012-02-25: 1000 mL via INTRAVENOUS

## 2012-02-25 MED ORDER — ACETAMINOPHEN 650 MG RE SUPP: 650.0000 mg | Freq: Four times a day (QID) | RECTAL | Status: DC | PRN

## 2012-02-25 MED ORDER — SODIUM CHLORIDE 0.9 % IJ SOLN
3.0000 mL | Freq: Two times a day (BID) | INTRAMUSCULAR | Status: DC
  Administered 2012-02-25 – 2012-02-27 (×2): 3 mL via INTRAVENOUS
  Administered 2012-02-27: 10 mL via INTRAVENOUS
  Administered 2012-03-05 – 2012-03-12 (×9): 3 mL via INTRAVENOUS

## 2012-02-25 MED ORDER — SODIUM CHLORIDE 0.9 % IV BOLUS (SEPSIS)
2000.0000 mL | Freq: Once | INTRAVENOUS | Status: AC
  Administered 2012-02-25: 2000 mL via INTRAVENOUS

## 2012-02-25 MED ORDER — PROMETHAZINE HCL 25 MG/ML IJ SOLN
12.5000 mg | Freq: Once | INTRAMUSCULAR | Status: DC
  Filled 2012-02-25: qty 1

## 2012-02-25 MED ORDER — DOCUSATE SODIUM 100 MG PO CAPS
100.0000 mg | ORAL_CAPSULE | Freq: Every day | ORAL | Status: DC | PRN
  Filled 2012-02-25: qty 1

## 2012-02-25 MED ORDER — HYDROMORPHONE HCL 2 MG PO TABS: 2.0000 mg | ORAL_TABLET | Freq: Four times a day (QID) | ORAL | Status: AC | PRN

## 2012-02-25 MED ORDER — GI COCKTAIL ~~LOC~~: 30.0000 mL | Freq: Once | ORAL | Status: DC

## 2012-02-25 MED ORDER — POTASSIUM CHLORIDE 10 MEQ/100ML IV SOLN
10.0000 meq | INTRAVENOUS | Status: AC
  Administered 2012-02-25 – 2012-02-26 (×5): 10 meq via INTRAVENOUS
  Filled 2012-02-25 (×5): qty 100

## 2012-02-25 MED ORDER — PROMETHAZINE HCL 25 MG/ML IJ SOLN
25.0000 mg | INTRAMUSCULAR | Status: DC | PRN
  Administered 2012-02-25 – 2012-03-11 (×67): 25 mg via INTRAVENOUS
  Filled 2012-02-25 (×70): qty 1

## 2012-02-25 MED ORDER — POTASSIUM CHLORIDE 10 MEQ/100ML IV SOLN
10.0000 meq | Freq: Once | INTRAVENOUS | Status: DC
  Administered 2012-02-25: 10 meq via INTRAVENOUS
  Filled 2012-02-25: qty 100

## 2012-02-25 MED ORDER — METOCLOPRAMIDE HCL 5 MG/ML IJ SOLN
10.0000 mg | Freq: Four times a day (QID) | INTRAMUSCULAR | Status: DC
  Administered 2012-02-26 – 2012-03-14 (×69): 10 mg via INTRAVENOUS
  Filled 2012-02-25 (×86): qty 2

## 2012-02-25 MED ORDER — METOPROLOL SUCCINATE ER 50 MG PO TB24
50.0000 mg | ORAL_TABLET | Freq: Every day | ORAL | Status: DC
  Administered 2012-02-25 – 2012-03-03 (×7): 50 mg via ORAL
  Filled 2012-02-25 (×8): qty 1

## 2012-02-25 MED ORDER — SCOPOLAMINE 1 MG/3DAYS TD PT72
1.0000 | MEDICATED_PATCH | TRANSDERMAL | Status: DC
  Administered 2012-02-28 – 2012-03-11 (×5): 1.5 mg via TRANSDERMAL
  Filled 2012-02-25 (×7): qty 1

## 2012-02-25 MED ORDER — PROMETHAZINE HCL 25 MG RE SUPP: 25.0000 mg | Freq: Four times a day (QID) | RECTAL | Status: AC | PRN

## 2012-02-25 MED ORDER — HYDROMORPHONE HCL PF 2 MG/ML IJ SOLN: 2.0000 mg | Freq: Once | INTRAMUSCULAR | Status: DC

## 2012-02-25 MED ORDER — PANTOPRAZOLE SODIUM 20 MG PO TBEC: 20.0000 mg | DELAYED_RELEASE_TABLET | Freq: Every day | ORAL | Status: DC

## 2012-02-25 MED ORDER — ACETAMINOPHEN 325 MG PO TABS
650.0000 mg | ORAL_TABLET | Freq: Four times a day (QID) | ORAL | Status: DC | PRN
Start: 2012-02-25 — End: 2012-03-14

## 2012-02-25 MED ORDER — INSULIN GLARGINE 100 UNIT/ML ~~LOC~~ SOLN
5.0000 [IU] | Freq: Every day | SUBCUTANEOUS | Status: DC
  Administered 2012-02-25: 5 [IU] via SUBCUTANEOUS

## 2012-02-25 MED ORDER — METOCLOPRAMIDE HCL 5 MG/ML IJ SOLN
10.0000 mg | Freq: Once | INTRAMUSCULAR | Status: AC
  Administered 2012-02-25: 10 mg via INTRAMUSCULAR
  Filled 2012-02-25: qty 2

## 2012-02-25 MED ORDER — ENOXAPARIN SODIUM 40 MG/0.4ML ~~LOC~~ SOLN
40.0000 mg | SUBCUTANEOUS | Status: DC
  Administered 2012-02-25 – 2012-03-13 (×17): 40 mg via SUBCUTANEOUS
  Filled 2012-02-25 (×19): qty 0.4

## 2012-02-25 MED ORDER — PROMETHAZINE HCL 25 MG PO TABS: 25.0000 mg | ORAL_TABLET | Freq: Three times a day (TID) | ORAL | Status: AC | PRN

## 2012-02-25 MED ORDER — ALUM & MAG HYDROXIDE-SIMETH 200-200-20 MG/5ML PO SUSP: 30.0000 mL | Freq: Four times a day (QID) | ORAL | Status: DC | PRN

## 2012-02-25 NOTE — ED Notes (Signed)
Hold Phenergan and Did

## 2012-02-25 NOTE — ED Notes (Signed)
cbg 311

## 2012-02-25 NOTE — Progress Notes (Signed)
Requested to place PIV for this pt.  Pt states is known to be a hard stick with hx of PICCs in the past.  Attempted x2 unsuccessfully.  Dr. Wilson Singer at bedside aware and attempting IV.

## 2012-02-25 NOTE — ED Notes (Signed)
Hold phenergan an Dilaudid for now pt is to sedated

## 2012-02-25 NOTE — ED Provider Notes (Signed)
Patient signed out to me by Dr. Wilson Singer and she'll be given potassium supplementation and admitted by [facility]  Martha Jacobsen, MD 02/25/12 1902

## 2012-02-25 NOTE — ED Notes (Signed)
IV attempted x2 unsuccessful IV Team called

## 2012-02-25 NOTE — H&P (Signed)
PCP:   [name], MD   Chief Complaint:  Nausea, vomiting, abdominal pain  HPI: This is a 41 year old [place] American female with a history of diabetes with gastroparesis, hypertension, gastroesophageal reflux disease, coronary artery disease, COPD.  She presents to the emergency department were with severe 10 out of 10 diffuse abdominal pain that started earlier today. There's been no improvement over the past several hours. The pain is worse every time she eats. There are no palliating factors. There is no radiation to the patient's pain. She does have extreme nausea and vomiting. There is no blood in the emesis.  Review of Systems:  The patient denies anorexia, fever, weight loss,, vision loss, decreased hearing, hoarseness, chest pain, syncope, dyspnea on exertion, peripheral edema, balance deficits, hemoptysis, melena, hematochezia, severe indigestion/heartburn, hematuria, incontinence, genital sores, muscle weakness, suspicious skin lesions, transient blindness, difficulty walking, depression, unusual weight change, abnormal bleeding, enlarged lymph nodes, angioedema, and breast masses.  Past Medical History: Past Medical History  Diagnosis Date  . Hypertension   . Diabetes mellitus   . Gastroparesis   . Asthma   . GERD (gastroesophageal reflux disease)   . Coronary artery disease   . h/o seizure   . Neuropathy   . Chronic pancreatitis   . Dyslipidemia   . MI (myocardial infarction)   . COPD (chronic obstructive pulmonary disease)    Past Surgical History  Procedure Date  . Abdominal hysterectomy   . Back surgery   . Cholecystectomy   . Peg tube x 4   . Cesarean section     Medications: Prior to Admission medications   Medication Sig Start Date End Date Taking? Authorizing Provider  aspirin 81 MG EC tablet Take 1 tablet (81 mg total) by mouth daily. 12/02/11 12/01/12 Yes Robbie Lis, MD  atorvastatin (LIPITOR) 40 MG tablet Take 1 tablet (40 mg total) by mouth daily.  12/02/11 12/01/12 Yes Robbie Lis, MD  docusate sodium (COLACE) 100 MG capsule Take 100 mg by mouth 2 (two) times daily.   Yes Historical Provider, MD  esomeprazole (NEXIUM) 40 MG capsule Take 1 capsule (40 mg total) by mouth daily before breakfast. 12/02/11  Yes Robbie Lis, MD  fentaNYL (DURAGESIC - DOSED MCG/HR) 25 MCG/HR Place 1 patch (25 mcg total) onto the skin every 3 (three) days. 02/13/12 03/14/12 Yes Robbie Lis, MD  Fluticasone-Salmeterol (ADVAIR) 250-50 MCG/DOSE AEPB Inhale 1 puff into the lungs 2 (two) times daily. 12/02/11  Yes Robbie Lis, MD  gabapentin (NEURONTIN) 600 MG tablet Take 1 tablet (600 mg total) by mouth 3 (three) times daily. 12/02/11 12/01/12 Yes Robbie Lis, MD  insulin glargine (LANTUS) 100 UNIT/ML injection Inject 10 Units into the skin at bedtime. 12/02/11 12/01/12 Yes Robbie Lis, MD  insulin lispro (HUMALOG) 100 UNIT/ML injection Inject 10-20 Units into the skin 3 (three) times daily before meals. Sliding scale 12/02/11  Yes Robbie Lis, MD  lisinopril (PRINIVIL) 10 MG tablet Take 2 tablets (20 mg total) by mouth daily. 12/02/11 12/01/12 Yes Robbie Lis, MD  metoprolol succinate (TOPROL XL) 50 MG 24 hr tablet Take 1 tablet (50 mg total) by mouth daily. 12/02/11 12/01/12 Yes Robbie Lis, MD  Multiple Vitamin (MULITIVITAMIN WITH MINERALS) TABS Take 1 tablet by mouth daily. 12/02/11  Yes Robbie Lis, MD  pantoprazole (PROTONIX) 20 MG tablet Take 1 tablet (20 mg total) by mouth daily. 12/02/11 12/01/12 Yes Robbie Lis, MD  pregabalin (LYRICA) 100 MG capsule Take  100 mg by mouth 2 (two) times daily.   Yes Historical Provider, MD  zolpidem (AMBIEN) 5 MG tablet Take 1 tablet (5 mg total) by mouth at bedtime as needed for sleep (insomnia). 12/02/11 12/01/12 Yes Robbie Lis, MD  HYDROmorphone (DILAUDID) 2 MG tablet Take 1 tablet (2 mg total) by mouth every 6 (six) hours as needed for pain. 02/25/12 03/06/12  Virgel Manifold, MD  pantoprazole (PROTONIX) 20 MG tablet Take 1 tablet (20 mg total)  by mouth daily. 02/25/12 02/24/13  Virgel Manifold, MD  promethazine (PHENERGAN) 25 MG suppository Place 1 suppository (25 mg total) rectally every 6 (six) hours as needed for nausea. 02/25/12 03/03/12  Virgel Manifold, MD  promethazine (PHENERGAN) 25 MG tablet Take 1 tablet (25 mg total) by mouth every 8 (eight) hours as needed for nausea. 02/25/12 03/03/12  Virgel Manifold, MD    Allergies:   Allergies  Allergen Reactions  . Compazine Hives  . Darvocet (Propoxyphene-Acetaminophen) Other (See Comments)    UNKNOWN  . Nsaids Other (See Comments)    UNKNOWN  . Penicillins Hives  . Zofran Hives    Social History:  reports that she has been smoking Cigarettes.  She has a .5 pack-year smoking history. She has never used smokeless tobacco. She reports that she does not drink alcohol or use illicit drugs.  Family History: Family History  Problem Relation Age of Onset  . Cancer Mother     Physical Exam: Filed Vitals:   02/25/12 1304 02/25/12 1600 02/25/12 1900 02/25/12 1922  BP: 186/116 124/80 131/84 131/84  Pulse: 119 105 95   Temp: 98.8 F (37.1 C) 99 F (37.2 C)    TempSrc: Oral Oral  Oral  Resp: 30 16  20   SpO2: 100% 96% 97% 100%   General appearance: alert, cooperative and moderate distress Head: Normocephalic, without obvious abnormality, atraumatic Eyes: conjunctivae/corneas clear. PERRL, EOM's intact. Fundi benign. Resp: Mild wheezes throughout. No rales or rhonchi. Cardio: regular rate and rhythm, S1, S2 normal, no murmur, click, rub or gallop GI: Diffuse abdominal tenderness. There is much guarding. There is no rebound tenderness. Extremities: extremities normal, atraumatic, no cyanosis or edema Pulses: 2+ and symmetric Skin: Skin color, texture, turgor normal. No rashes or lesions Lymph nodes: Cervical, supraclavicular, and axillary nodes normal. Neurologic: Alert and oriented X 3, normal strength and tone. Normal symmetric reflexes. Normal coordination and gait   Labs on  Admission:   [facility] 02/25/12 1751  NA 132*  K 2.7*  CL 87*  CO2 34*  GLUCOSE 284*  BUN 19  CREATININE 0.87  CALCIUM 9.3  MG --  PHOS --    Basename 02/25/12 1751  AST 23  ALT 9  ALKPHOS 102  BILITOT 0.2*  PROT 7.3  ALBUMIN 3.1*    Basename 02/25/12 1751  LIPASE 13  AMYLASE --    Basename 02/25/12 1751  WBC 11.3*  NEUTROABS 8.4*  HGB 13.6  HCT 38.4  MCV 84.6  PLT 298   No results found for this basename: CKTOTAL:3,CKMB:3,CKMBINDEX:3,TROPONINI:3 in the last 72 hours No results found for this basename: TSH,T4TOTAL,FREET3,T3FREE,THYROIDAB in the last 72 hours No results found for this basename: VITAMINB12:2,FOLATE:2,FERRITIN:2,TIBC:2,IRON:2,RETICCTPCT:2 in the last 72 hours  Radiological Exams on Admission: Dg Abd 1 View  02/25/2012  *RADIOLOGY REPORT*  Clinical Data: Abdominal pain, vomiting  ABDOMEN - 1 VIEW  Comparison: 02/09/2012  Findings: Post cholecystectomy surgical clips are noted.  Stable postsurgical changes lumbar spine.  Mild gaseous distended bowel loops mid abdomen suspicious for  ileus.  IMPRESSION: Gaseous distended bowel loops mid abdomen suspicious for ileus. Stable postsurgical changes.  Original Report Authenticated By: Lahoma Crocker, M.D.   Ir Fluoro Guide Cv Line Right  02/10/2012  *RADIOLOGY REPORT*  Clinical Data: Nausea  and vomiting, history of chronic pancreatitis; request is made for central venous access  for fluids and medications.  PICC LINE PLACEMENT WITH ULTRASOUND AND FLUOROSCOPIC  GUIDANCE  Fluoroscopy Time: 1.3 minutes.  The right  arm was prepped with chlorhexidine, draped in the usual sterile fashion using maximum barrier technique (cap and mask, sterile gown, sterile gloves, large sterile sheet, hand hygiene and cutaneous antisepsis) and infiltrated locally with 1% Lidocaine.  Ultrasound demonstrated patency of the right brachial vein, and this was documented with an image.  Under real-time ultrasound guidance, this vein was accessed with  a 21 gauge micropuncture needle and image documentation was performed.  The needle was exchanged over a guidewire for a peel-away sheath through which a five [place] double lumen PICC trimmed to 38 cm was advanced, positioned with its tip at the lower SVC/right atrial junction. Fluoroscopy during the procedure and fluoro spot radiograph confirms appropriate catheter position.  The catheter was flushed, secured to the skin with Prolene sutures, and covered with a sterile dressing.  Complications:  None  IMPRESSION: Successful right arm PICC line placement with ultrasound and fluoroscopic guidance.  The catheter is ready for use.  Read by: Rowe Robert, P.A.-C  Original Report Authenticated By: Azzie Roup, M.D.   Ir US Guide Vasc Access Right  02/24/2012  *RADIOLOGY REPORT*  Clinical Data: Nausea  and vomiting, history of chronic pancreatitis; request is made for central venous access  for fluids and medications.  PICC LINE PLACEMENT WITH ULTRASOUND AND FLUOROSCOPIC  GUIDANCE  Fluoroscopy Time: 1.3 minutes.  The right  arm was prepped with chlorhexidine, draped in the usual sterile fashion using maximum barrier technique (cap and mask, sterile gown, sterile gloves, large sterile sheet, hand hygiene and cutaneous antisepsis) and infiltrated locally with 1% Lidocaine.  Ultrasound demonstrated patency of the right brachial vein, and this was documented with an image.  Under real-time ultrasound guidance, this vein was accessed with a 21 gauge micropuncture needle and image documentation was performed.  The needle was exchanged over a guidewire for a peel-away sheath through which a five [place] double lumen PICC trimmed to 38 cm was advanced, positioned with its tip at the lower SVC/right atrial junction. Fluoroscopy during the procedure and fluoro spot radiograph confirms appropriate catheter position.  The catheter was flushed, secured to the skin with Prolene sutures, and covered with a sterile dressing.   Complications:  None  IMPRESSION: Successful right arm PICC line placement with ultrasound and fluoroscopic guidance.  The catheter is ready for use.  Read by: Rowe Robert, P.A.-C  Original Report Authenticated By: Azzie Roup, M.D.   Dg Abd Acute W/chest  02/09/2012  *RADIOLOGY REPORT*  Clinical Data: Vomiting, abdominal pain  ACUTE ABDOMEN SERIES (ABDOMEN 2 VIEW & CHEST 1 VIEW)  Comparison: 01/12/2012  Findings: Nasogastric tube extends into stomach though the proximal side port is at the distal esophagus. Right arm PICC line, tip projecting over cavoatrial junction. Normal heart size, mediastinal contours, and pulmonary vascularity. Lungs clear. Numerous cardiac monitoring leads project over chest. Prior L4-L5 fusion and L4 laminectomy.  Surgical clips right upper quadrant question cholecystectomy. Metallic foreign body question BB projects over left pelvis. Marked osseous demineralization. Nonobstructive bowel gas pattern. No definite bowel dilatation, bowel  wall thickening or free intraperitoneal air. Gas collection identified in pelvis, uncertain if represents bowel or air filled bladder. Anastomotic staple lines from bowel surgery in the left mid abdomen. Single tiny questionable nonobstructing right renal calculus.  IMPRESSION: Recommend advancing nasogastric tube 8 cm to place the proximal side port in the proximal stomach. Nonobstructive bowel gas pattern though a paucity of bowel gas is noted. Rounded gas collection in the pelvis, question bowel versus air filled urinary bladder; has the patient been recently catheterized?  Original Report Authenticated By: Burnetta Sabin, M.D.    Assessment/Plan Present on Admission:  .Ileus .Gastroparesis .Hypokalemia .Dehydration  #1 ileus. Likely secondary gastroparesis and use of opioids. We'll admit the patient. Is no definite evidence of obstruction, will not place NG tube. We will treat the patient symptomatically with antiemetics: Phenergan  and Reglan. We will hold on Zofran as tends to be constipating. We'll also be cautious with use of narcotics.  #2 gastroparesis. Secondary to diabetes. Continue Reglan as stated above.  #3 hypokalemia. Will replace potassium IV and recheck potassium in the morning. As the patient has IV potassium, will admit the patient to telemetry.  #4 dehydration. Fluid replacement.  #5 diabetes mellitus. As the patient is n.p.o. we'll keep the patient on Lantus, although reduced to 5 units. No short acting insulin at this time.  #6 CODE STATUS: Full code  #7 DVT prophylaxis. Lovenox  Milano Rosevear JEHIEL 02/25/2012, 7:32 PM

## 2012-02-25 NOTE — ED Provider Notes (Addendum)
History    41yF with abdominal pain and n/v. Pt with hx of gastroparesis and chronic pain and vomiting. Worse within last few days. No fever or chills. No urinary complaints. Pt states cannot keep meds down. Feels like prior symptoms of gastroparesis. Admitted multiple times for similar. Reports hx of pancreatitis although lipase always normal   CSN: [id number]  Arrival date & time 02/25/12  1301   First MD Initiated Contact with Patient 02/25/12 1349      Chief Complaint  Patient presents with  . Abdominal Pain    (Consider location/radiation/quality/duration/timing/severity/associated sxs/prior treatment) HPI  Past Medical History  Diagnosis Date  . Hypertension   . Diabetes mellitus   . Gastroparesis   . Asthma   . GERD (gastroesophageal reflux disease)   . Coronary artery disease   . h/o seizure   . Neuropathy   . Chronic pancreatitis   . Dyslipidemia   . MI (myocardial infarction)   . COPD (chronic obstructive pulmonary disease)     Past Surgical History  Procedure Date  . Abdominal hysterectomy   . Back surgery   . Cholecystectomy   . Peg tube x 4   . Cesarean section     Family History  Problem Relation Age of Onset  . Cancer Mother     History  Substance Use Topics  . Smoking status: Current Everyday Smoker -- 0.2 packs/day for 2 years    Types: Cigarettes  . Smokeless tobacco: Never Used  . Alcohol Use: No     hx 1 beer daily -pt states she quit Dec. 2012    OB History    Grav Para Term Preterm Abortions TAB SAB Ect Mult Living                  Review of Systems   Review of symptoms negative unless otherwise noted in HPI.   Allergies  Compazine; Darvocet; Nsaids; Penicillins; and Zofran  Home Medications   Current Outpatient Rx  Name Route Sig Dispense Refill  . ASPIRIN 81 MG PO TBEC Oral Take 1 tablet (81 mg total) by mouth daily. 30 tablet 0  . ATORVASTATIN CALCIUM 40 MG PO TABS Oral Take 1 tablet (40 mg total) by mouth daily. 30  tablet 0  . DOCUSATE SODIUM 100 MG PO CAPS Oral Take 100 mg by mouth 2 (two) times daily.    Marland Kitchen ESOMEPRAZOLE MAGNESIUM 40 MG PO CPDR Oral Take 1 capsule (40 mg total) by mouth daily before breakfast. 30 capsule 0  . FENTANYL 25 MCG/HR TD PT72 Transdermal Place 1 patch (25 mcg total) onto the skin every 3 (three) days. 5 patch 0  . FLUTICASONE-SALMETEROL 250-50 MCG/DOSE IN AEPB Inhalation Inhale 1 puff into the lungs 2 (two) times daily. 60 each 0  . GABAPENTIN 600 MG PO TABS Oral Take 1 tablet (600 mg total) by mouth 3 (three) times daily. 90 tablet 0  . INSULIN GLARGINE 100 UNIT/ML [place] SOLN Subcutaneous Inject 10 Units into the skin at bedtime. 3 mL 4    Please give pen  . INSULIN LISPRO (HUMAN) 100 UNIT/ML [place] SOLN Subcutaneous Inject 10-20 Units into the skin 3 (three) times daily before meals. Sliding scale 10 mL 2  . LISINOPRIL 10 MG PO TABS Oral Take 2 tablets (20 mg total) by mouth daily. 60 tablet 0  . METOPROLOL SUCCINATE ER 50 MG PO TB24 Oral Take 1 tablet (50 mg total) by mouth daily. 30 tablet 0  . ADULT MULTIVITAMIN W/MINERALS [facility]  Oral Take 1 tablet by mouth daily. 30 tablet 0  . PANTOPRAZOLE SODIUM 20 MG PO TBEC Oral Take 1 tablet (20 mg total) by mouth daily. 30 tablet 0  . PREGABALIN 100 MG PO CAPS Oral Take 100 mg by mouth 2 (two) times daily.    Marland Kitchen ZOLPIDEM TARTRATE 5 MG PO TABS Oral Take 1 tablet (5 mg total) by mouth at bedtime as needed for sleep (insomnia). 30 tablet 0    BP 186/116  Pulse 119  Temp 98.8 F (37.1 C) (Oral)  Resp 30  SpO2 100%  Physical Exam  Nursing note and vitals reviewed. Constitutional:       Laying in bed. Actively vomiting. Uncomfortable appearing.  HENT:  Head: Normocephalic and atraumatic.       Mucus membranes dry.  Eyes: Conjunctivae are normal. Right eye exhibits no discharge. Left eye exhibits no discharge.  Neck: Neck supple.  Cardiovascular: Normal rate, regular rhythm and normal heart sounds.  Exam reveals no gallop and no friction  rub.   No murmur heard. Pulmonary/Chest: Effort normal and breath sounds normal. No respiratory distress.       Well healed surgical scars. Diffuse tenderness without guarding or rebound. No distension.  Abdominal: Soft. She exhibits no distension. There is no tenderness.  Musculoskeletal: She exhibits no edema and no tenderness.  Skin: Skin is warm. She is not diaphoretic.       Poor turgor. Skin tenting.  Psychiatric:       anxious    ED Course  CENTRAL LINE Date/Time: 02/25/2012 5:49 PM Performed by: Virgel Manifold Authorized by: Virgel Manifold Consent: Verbal consent obtained. Risks and benefits: risks, benefits and alternatives were discussed Consent given by: patient Required items: required blood products, implants, devices, and special equipment available Patient identity confirmed: verbally with patient and arm band Time out: Immediately prior to procedure a "time out" was called to verify the correct patient, procedure, equipment, support staff and site/side marked as required. Indications: vascular access Anesthesia: local infiltration Local anesthetic: lidocaine 1% without epinephrine Anesthetic total: 3 ml Patient sedated: no Preparation: skin prepped with 2% chlorhexidine Skin prep agent dried: skin prep agent completely dried prior to procedure Sterile barriers: all five maximum sterile barriers used - cap, mask, sterile gown, sterile gloves, and large sterile sheet Hand hygiene: hand hygiene performed prior to central venous catheter insertion Location details: left femoral Catheter type: triple lumen Catheter size: 7 Fr Pre-procedure: landmarks identified Ultrasound guidance: yes Number of attempts: 1 Successful placement: yes Post-procedure: line sutured and dressing applied Assessment: blood return through all parts and free fluid flow Patient tolerance: Patient tolerated the procedure well with no immediate complications.   (including critical care  time)  Labs Reviewed  GLUCOSE, CAPILLARY - Abnormal; Notable for the following:    Glucose-Capillary 311 (*)     All other components within normal limits  LIPASE, BLOOD  COMPREHENSIVE METABOLIC PANEL  CBC WITH DIFFERENTIAL  URINALYSIS, ROUTINE W REFLEX MICROSCOPIC  LACTIC ACID, PLASMA   No results found.   1. Chronic abdominal pain   2. Gastroparesis   3. Nausea and vomiting       MDM  41 year old female with abdominal pain and nausea vomiting. XR consistent with dilated bowel consistent with ileus. Pt with ongoing vomiting in ED. Clinically dehydrated with dry mucus membranes and skin tenting. Pain poorly controlled. Will admit for IV hydration and symptomatic control. Unfortunately pt very difficult access and central line placed. Delay in drawing labs.  Signed  out to Dr Zenia Resides with labs pending, but will require admit regardless.     Virgel Manifold, MD 02/25/12 1806  Virgel Manifold, MD 03/02/12 [phone]

## 2012-02-25 NOTE — ED Notes (Addendum)
Pt brought in by ems for c/o abd mid lower abd pain ,here last week for same thing pt is yelling and demanding  meds

## 2012-02-26 ENCOUNTER — Inpatient Hospital Stay (HOSPITAL_COMMUNITY): Payer: PRIVATE HEALTH INSURANCE

## 2012-02-26 DIAGNOSIS — I10 Essential (primary) hypertension: Secondary | ICD-10-CM

## 2012-02-26 DIAGNOSIS — K560 Paralytic ileus: Secondary | ICD-10-CM

## 2012-02-26 DIAGNOSIS — K3184 Gastroparesis: Secondary | ICD-10-CM

## 2012-02-26 LAB — BASIC METABOLIC PANEL
BUN: 13 mg/dL (ref 6–23)
Calcium: 7.9 mg/dL — ABNORMAL LOW (ref 8.4–10.5)
GFR calc Af Amer: >90 mL/min (ref >90)
GFR calc non Af Amer: >90 mL/min (ref >90)
Glucose, Bld: 137 mg/dL — ABNORMAL HIGH (ref 70–99)
Potassium: 3.8 mEq/L (ref 3.5–5.1)

## 2012-02-26 LAB — GLUCOSE, CAPILLARY
Glucose-Capillary: 196 mg/dL — ABNORMAL HIGH (ref 70–99)
Glucose-Capillary: 108 mg/dL — ABNORMAL HIGH (ref 70–99)
Glucose-Capillary: 98 mg/dL (ref 70–99)
Glucose-Capillary: 78 mg/dL (ref 70–99)
Glucose-Capillary: 74 mg/dL (ref 70–99)

## 2012-02-26 MED ORDER — SODIUM CHLORIDE 0.9 % IV SOLN
INTRAVENOUS | Status: DC
  Administered 2012-02-26: 100 mL via INTRAVENOUS
  Administered 2012-02-27: 11:00:00 via INTRAVENOUS

## 2012-02-26 MED ORDER — HYDROMORPHONE HCL PF 2 MG/ML IJ SOLN
3.0000 mg | Freq: Once | INTRAMUSCULAR | Status: AC
  Administered 2012-02-26: 3 mg via INTRAVENOUS
  Filled 2012-02-26: qty 2

## 2012-02-26 MED ORDER — HYDROMORPHONE HCL PF 2 MG/ML IJ SOLN: 3.0000 mg | Freq: Once | INTRAMUSCULAR | Status: DC

## 2012-02-26 MED ORDER — HYDROMORPHONE HCL PF 2 MG/ML IJ SOLN
2.0000 mg | INTRAMUSCULAR | Status: DC | PRN
  Administered 2012-02-26 – 2012-03-07 (×72): 2 mg via INTRAVENOUS
  Filled 2012-02-26 (×74): qty 1

## 2012-02-26 MED ORDER — PANTOPRAZOLE SODIUM 40 MG IV SOLR
40.0000 mg | Freq: Two times a day (BID) | INTRAVENOUS | Status: DC
  Administered 2012-02-26 – 2012-03-14 (×34): 40 mg via INTRAVENOUS
  Filled 2012-02-26 (×36): qty 40

## 2012-02-26 NOTE — Progress Notes (Signed)
[facility] PROGRESS NOTE  [name] [id number] DOB: 1971/07/04 DOA: 02/25/2012 PCP: [name], MD  Assessment/Plan: Active Problems:  Gastroparesis  Hypokalemia  Ileus  Dehydration   1 ileus.  -Likely secondary gastroparesis and use of opioids.  Is no definite evidence of obstruction, will not place NG tube. We will treat the patient symptomatically with antiemetics: Phenergan and Reglan.  -need to be cautious with use of narcotics, but pt still with c/o increased pain this today and requesting increased pain meds -will obtain CT of abd and pelvis, follow.  #2 gastroparesis. Secondary to diabetes. Continue Reglan as stated above.  #3 hypokalemia.  -resolved #4 dehydration. Hydrate with ivf.  #5 diabetes mellitus.  -keep n.p.o., hydrate with ivf -continue low dose basal lantus, follow #6 CODE STATUS: Full code  #7 DVT prophylaxis. Lovenox #8h/o chronic pancreatitis/chronic pain -lipase wnl, check CT abd as above, follow  Code Status:full  Disposition Plan: home when medically stable   Brief narrative: This is a 41 year old African American female with a history of diabetes with gastroparesis, chronic pancreatitis, hypertension, gastroesophageal reflux disease, coronary artery disease, COPD, neuropathy admitted with severe 10 out of 10 diffuse abdominal pain that started earlier on day of admission, abd x-ray done in ED was c/w ileus, no obstruction.   Consultants:  none  Procedures:  Central line - per Dr Wilson Singer in ED  Antibiotics:  NONE  HPI/Subjective: C/o increased abd. Pain - in tears,+nausea & gagging  Objective: Filed Vitals:   02/25/12 2129 02/26/12 0449 02/26/12 0747 02/26/12 1016  BP: 161/98 129/81 125/77 125/102  Pulse: 98 92 90 94  Temp: 98.2 F (36.8 C) 98.6 F (37 C) 98.9 F (37.2 C)   TempSrc: Oral Oral Oral   Resp: 18 16 16    Height: 5\' 4"  (1.626 m)     Weight: 56.9 kg (125 lb 7.1 oz)     SpO2: 97% 96% 96%      Intake/Output Summary (Last 24 hours) at 02/26/12 1243 Last data filed at 02/26/12 0500  Gross per 24 hour  Intake   2514 ml  Output    800 ml  Net   1714 ml    Exam:   General: middle aged BF in moderate distress 2/2 pain  Cardiovascular: RRR  Respiratory: clear, no crackles  Abdomen: soft, +BS, diffusely tender, no rebound, no masses plapable  Data Reviewed: Basic Metabolic Panel:  Lab [id number] 02/25/12 1751  NA 135 132*  K 3.8 2.7*  CL 98 87*  CO2 27 34*  GLUCOSE 137* 284*  BUN 13 19  CREATININE 0.53 0.87  CALCIUM 7.9* 9.3  MG -- --  PHOS -- --   Liver Function Tests:  Lab 02/25/12 1751  AST 23  ALT 9  ALKPHOS 102  BILITOT 0.2*  PROT 7.3  ALBUMIN 3.1*    Lab 02/25/12 1751  LIPASE 13  AMYLASE --   No results found for this basename: AMMONIA:5 in the last 168 hours CBC:  Lab 02/25/12 1751  WBC 11.3*  NEUTROABS 8.4*  HGB 13.6  HCT 38.4  MCV 84.6  PLT 298   Cardiac Enzymes: No results found for this basename: CKTOTAL:5,CKMB:5,CKMBINDEX:5,TROPONINI:5 in the last 168 hours BNP (last 3 results) No results found for this basename: PROBNP:3 in the last 8760 hours CBG:  Lab 02/26/12 1149 02/26/12 0739 02/25/12 2144 02/25/12 1643  GLUCAP 98 108* 196* 311*    No results found for this or any previous visit (from the past 240 hour(s)).  Studies: Dg Abd 1 View  02/25/2012  *RADIOLOGY REPORT*  Clinical Data: Abdominal pain, vomiting  ABDOMEN - 1 VIEW  Comparison: 02/09/2012  Findings: Post cholecystectomy surgical clips are noted.  Stable postsurgical changes lumbar spine.  Mild gaseous distended bowel loops mid abdomen suspicious for ileus.  IMPRESSION: Gaseous distended bowel loops mid abdomen suspicious for ileus. Stable postsurgical changes.  Original Report Authenticated By: Lahoma Crocker, M.D.   Ir Fluoro Guide Cv Line Right  02/10/2012  *RADIOLOGY REPORT*  Clinical Data: Nausea  and vomiting, history of chronic pancreatitis; request is  made for central venous access  for fluids and medications.  PICC LINE PLACEMENT WITH ULTRASOUND AND FLUOROSCOPIC  GUIDANCE  Fluoroscopy Time: 1.3 minutes.  The right  arm was prepped with chlorhexidine, draped in the usual sterile fashion using maximum barrier technique (cap and mask, sterile gown, sterile gloves, large sterile sheet, hand hygiene and cutaneous antisepsis) and infiltrated locally with 1% Lidocaine.  Ultrasound demonstrated patency of the right brachial vein, and this was documented with an image.  Under real-time ultrasound guidance, this vein was accessed with a 21 gauge micropuncture needle and image documentation was performed.  The needle was exchanged over a guidewire for a peel-away sheath through which a five [place] double lumen PICC trimmed to 38 cm was advanced, positioned with its tip at the lower SVC/right atrial junction. Fluoroscopy during the procedure and fluoro spot radiograph confirms appropriate catheter position.  The catheter was flushed, secured to the skin with Prolene sutures, and covered with a sterile dressing.  Complications:  None  IMPRESSION: Successful right arm PICC line placement with ultrasound and fluoroscopic guidance.  The catheter is ready for use.  Read by: Rowe Robert, P.A.-C  Original Report Authenticated By: Azzie Roup, M.D.   Ir US Guide Vasc Access Right  02/24/2012  *RADIOLOGY REPORT*  Clinical Data: Nausea  and vomiting, history of chronic pancreatitis; request is made for central venous access  for fluids and medications.  PICC LINE PLACEMENT WITH ULTRASOUND AND FLUOROSCOPIC  GUIDANCE  Fluoroscopy Time: 1.3 minutes.  The right  arm was prepped with chlorhexidine, draped in the usual sterile fashion using maximum barrier technique (cap and mask, sterile gown, sterile gloves, large sterile sheet, hand hygiene and cutaneous antisepsis) and infiltrated locally with 1% Lidocaine.  Ultrasound demonstrated patency of the right brachial vein, and  this was documented with an image.  Under real-time ultrasound guidance, this vein was accessed with a 21 gauge micropuncture needle and image documentation was performed.  The needle was exchanged over a guidewire for a peel-away sheath through which a five [place] double lumen PICC trimmed to 38 cm was advanced, positioned with its tip at the lower SVC/right atrial junction. Fluoroscopy during the procedure and fluoro spot radiograph confirms appropriate catheter position.  The catheter was flushed, secured to the skin with Prolene sutures, and covered with a sterile dressing.  Complications:  None  IMPRESSION: Successful right arm PICC line placement with ultrasound and fluoroscopic guidance.  The catheter is ready for use.  Read by: Rowe Robert, P.A.-C  Original Report Authenticated By: Azzie Roup, M.D.   Dg Abd Acute W/chest  02/09/2012  *RADIOLOGY REPORT*  Clinical Data: Vomiting, abdominal pain  ACUTE ABDOMEN SERIES (ABDOMEN 2 VIEW & CHEST 1 VIEW)  Comparison: 01/12/2012  Findings: Nasogastric tube extends into stomach though the proximal side port is at the distal esophagus. Right arm PICC line, tip projecting over cavoatrial junction. Normal heart size, mediastinal  contours, and pulmonary vascularity. Lungs clear. Numerous cardiac monitoring leads project over chest. Prior L4-L5 fusion and L4 laminectomy.  Surgical clips right upper quadrant question cholecystectomy. Metallic foreign body question BB projects over left pelvis. Marked osseous demineralization. Nonobstructive bowel gas pattern. No definite bowel dilatation, bowel wall thickening or free intraperitoneal air. Gas collection identified in pelvis, uncertain if represents bowel or air filled bladder. Anastomotic staple lines from bowel surgery in the left mid abdomen. Single tiny questionable nonobstructing right renal calculus.  IMPRESSION: Recommend advancing nasogastric tube 8 cm to place the proximal side port in the proximal  stomach. Nonobstructive bowel gas pattern though a paucity of bowel gas is noted. Rounded gas collection in the pelvis, question bowel versus air filled urinary bladder; has the patient been recently catheterized?  Original Report Authenticated By: Burnetta Sabin, M.D.    Scheduled Meds:   . enoxaparin (LOVENOX) injection  40 mg Subcutaneous Q24H  . fentaNYL  25 mcg Transdermal Q72H  . Fluticasone-Salmeterol  1 puff Inhalation BID  .  HYDROmorphone (DILAUDID) injection  2 mg Intramuscular Once  .  HYDROmorphone (DILAUDID) injection  2 mg Intramuscular Once  . insulin glargine  5 Units Subcutaneous QHS  . lisinopril  20 mg Oral Daily  . metoCLOPramide (REGLAN) injection  10 mg Intramuscular Once  . metoCLOPramide (REGLAN) injection  10 mg Intravenous Q6H  . metoprolol succinate  50 mg Oral Daily  . pantoprazole (PROTONIX) IV  40 mg Intravenous Q24H  . potassium chloride  10 mEq Intravenous Q1 Hr x 5  . promethazine  25 mg Intramuscular Once  . scopolamine  1 patch Transdermal Q72H  . sodium chloride  1,000 mL Intravenous Once  . sodium chloride  2,000 mL Intravenous Once  . sodium chloride  3 mL Intravenous Q12H  . DISCONTD: gi cocktail  30 mL Oral Once  . DISCONTD:  HYDROmorphone (DILAUDID) injection  2 mg Intramuscular Once  . DISCONTD: potassium chloride  10 mEq Intravenous Once  . DISCONTD: promethazine  12.5 mg Intramuscular Once   Continuous Infusions:   Active Problems:  Gastroparesis  Hypokalemia  Ileus  Dehydration    Time spent: 57mins    Keeva Reisen C  [facility] Pager [phone]. If 8PM-8AM, please contact night-coverage at [email or web address], password [facility] 02/26/2012, 12:43 PM  LOS: 1 day

## 2012-02-26 NOTE — Progress Notes (Signed)
Patient had 2.79second pause, HR down in the 30s nonsustained; but patient have been in NSR-ST 101. Vitals 125/77, 90, 96%-RA 16-resp. Patient C/O mid abd pain and right sided chest pain as well. Dr. Dillard Essex notified, no new order given will continue to assess patient.

## 2012-02-26 NOTE — Progress Notes (Signed)
C/O needing more pain medication and nausea  Dr. Dillard Essex informed, 1 time 3mg  dilaudid IV given. Will continue to monitor patient.

## 2012-02-26 NOTE — Progress Notes (Signed)
Patient continue to C/O abd pain and that 1mg  dilaudid is not helping, Dr. Dillard Essex notified dose increased. Will continue to assess patient.

## 2012-02-27 ENCOUNTER — Encounter (HOSPITAL_COMMUNITY): Payer: Self-pay | Admitting: Gastroenterology

## 2012-02-27 LAB — GLUCOSE, CAPILLARY
Glucose-Capillary: 104 mg/dL — ABNORMAL HIGH (ref 70–99)
Glucose-Capillary: 63 mg/dL — ABNORMAL LOW (ref 70–99)
Glucose-Capillary: 57 mg/dL — ABNORMAL LOW (ref 70–99)
Glucose-Capillary: 81 mg/dL (ref 70–99)
Glucose-Capillary: 84 mg/dL (ref 70–99)
Glucose-Capillary: 98 mg/dL (ref 70–99)

## 2012-02-27 LAB — BASIC METABOLIC PANEL
BUN: 9 mg/dL (ref 6–23)
Chloride: 100 mEq/L (ref 96–112)
GFR calc Af Amer: >90 mL/min (ref >90)
GFR calc non Af Amer: >90 mL/min (ref >90)
Potassium: 3 mEq/L — ABNORMAL LOW (ref 3.5–5.1)
Sodium: 136 mEq/L (ref 135–145)

## 2012-02-27 LAB — CBC
MCHC: 34.2 g/dL (ref 30.0–36.0)
Platelets: 200 10*3/uL (ref 150–400)
RDW: 14.2 % (ref 11.5–15.5)
WBC: 7.4 10*3/uL (ref 4.0–10.5)

## 2012-02-27 MED ORDER — DEXTROSE 50 % IV SOLN
INTRAVENOUS | Status: AC
  Administered 2012-02-27: 50 mL
  Filled 2012-02-27: qty 50

## 2012-02-27 MED ORDER — MAGIC MOUTHWASH W/LIDOCAINE
5.0000 mL | Freq: Four times a day (QID) | ORAL | Status: DC | PRN
  Administered 2012-03-11: 5 mL via ORAL
  Filled 2012-02-27: qty 5

## 2012-02-27 MED ORDER — DEXTROSE-NACL 5-0.9 % IV SOLN
INTRAVENOUS | Status: DC
  Administered 2012-02-27 – 2012-03-01 (×5): via INTRAVENOUS

## 2012-02-27 MED ORDER — POTASSIUM CHLORIDE 10 MEQ/100ML IV SOLN
10.0000 meq | INTRAVENOUS | Status: AC
  Administered 2012-02-27 – 2012-02-28 (×6): 10 meq via INTRAVENOUS
  Filled 2012-02-27 (×7): qty 100

## 2012-02-27 NOTE — Care Management Note (Signed)
Page 1 of 2   03/14/2012     2:41:36 PM   CARE MANAGEMENT NOTE 03/14/2012  Patient:  [name], [name]   Account Number:  [id number]  Date Initiated:  02/27/2012  Documentation initiated by:  Dessa Phi  Subjective/Objective Assessment:   ADMITTED W/N/V. Thom Chimes 7/11-7/15/13-GASTROPARESIS     Action/Plan:   FROM HOME.   Anticipated DC Date:  03/14/2012   Anticipated DC Plan:  HOME/SELF CARE      DC Planning Services  CM consult      Choice offered to / List presented to:  C-1 Patient   DME arranged  IV PUMP/EQUIPMENT           Status of service:  Completed, signed off Medicare Important Message given?   (If response is "NO", the following Medicare IM given date fields will be blank) Date Medicare IM given:   Date Additional Medicare IM given:    Discharge Disposition:  HOME/SELF CARE  Per UR Regulation:  Reviewed for med. necessity/level of care/duration of stay  If discussed at [place] of Stay Meetings, dates discussed:   03/06/2012    Comments:  03/14/12 Caven Perine RN,BSN NCM [place] [facility](DIETICIAN) RECEIVED ALL FAXED INFO FOR ENTERAL FEEDING-OSMOLITE,SUPPLIES,IV PUMP,POLE,CLAMP,BAGS,PLACEMENT OF GT WHICH DOCUMENTED QU:[id number] SUMMARY,PROGRESS NOTES.THEY HAVE PATIENT/SISTER TEL#'S TO COORDINATE DELIVERY OF OSMOLITE TO SISTER'S HOME IN [place], [facility] WILL DO 1 ADDITIONAL INSTRUCTION @ DELIVERY OF FEEDING.STAFF NURSE PROVIDED INITIAL TEACHING OF FEEDING,& PUMP.ALL OTHER SCRIPTS TO BE GIVEN TO PATIENT TO FILL @ OTPT PHARMACY.  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 DAVID [facility] TO FOLLOW  FOR GI RECOMMENDATIONS. 03/12/12 Izen Petz RN,BSN NCM [phone] IT'S NOTED THAT PATIENT NOT TOLERATING JT FEEDING,SHE REPORTS  N/V.ATTENDING/MED ADVISOR AGREED ON PLAN FOR BOWEL REST,PICC,TNA-NUTRITION.ORAL PAIN MEDS,TRYING TO ADDRESS READMISSIONS,& ER VISITS.D/C PLAN HOME W/TNA SINCE REFUSING SNF.[facility] FOLLOWING FOR ENTERAL FEEDING/TNA THEY WILL PROVIDE 1 HOME VISIT.FEEDING TEACHING SHOULD START IN HOSPITAL.  03/09/12 Kerstyn Coryell RN,BSN NCM [phone] PER [facility](LINDSAY-DIETICIAN)TEL#[phone]:PREFER STANDARD FORMULA OF JEVITY/OSMOLITE BUT IF SPECIALTY FORMULA(GLUCERNA) WILL NEED DIAGNOSIS EXPLAINING MEDICAL NEED FOR THIS FORMULA. WILL ALSO NEED DOSE/FREQ/DURATION.JT PLACED SUCCESSFULLY. HHRN IN PLACE,& INFUSION OF TPN IN PLACE.  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(MICHELLE Thoma-SISTER AGREES TO PLAN) IN [facility] [place] [address].TEL#[phone] WHO AGREES TO BE TAUGHT NUTRITION FEEDING.PURSUING HOME [address], [place] [address] [id number] FOR HHRN-ENTERAL NUTRITION INSTRUCTION.THEY DO NOT HAVE HHPT.[facility] TEL#[phone],FAX#[phone] WILL PROVIDE ENTERAL NUTRITION WILL FAX H&P,FACE SHEET,PROGRESS NOTE.PATIENT DEMONSTRATED GETTING UP OUT OF BED,& STANDING.SAYS SHE CAN WALK TO BATHROOM.MD [place] BED WOULD NOT BE OF BENEFIT FOR PATIENT TO PROGRESS.PATIENT NOW DECLINES HOSPITAL BED. FOR JEJUNOSTOMY PLACEMENT.[facility] WILL BE UNABLE TO PROVIDE HH,BUT ABLE TO CHECK INTO HOSPITAL BED.[facility]([place])TEL#[phone],FAX#[phone],H&P W/CONFIRMATION.HHRN-ENTERAL NUTRITION INSTRUCTION.TEACHING MUST START IN HOSPITAL.PATIENT MUST HAVE AVAILABLE CAREGIVER TO TEACH.PATIENT AWARE OF AVAILABLE CAREGIVER & SHE AGREES TO  HAVE FAMILY TO ASSIST W/TEACHING.WILL ALSO EXPLORE PHARMACY FOR ENTERAL NUTRITION.PATIENT PLANS TO RETURN HOME.  03/06/12 Carolyna Yerian RN,BSN NCM [place]  TUBE IN AM.[facility] CHOSEN FOR HHRN-TF INSTRUCTION/MGMNT IF NEEDED. 03/05/12 Lashaundra Lehrmann RN,BSN NCM [phone] [place].GI FOLLOWING.?TUBE FEEDING.  02/29/12 Aeriel Boulay RN,BSN NCM [phone] GI FOLLOWING.FOR UGI.CONTINUE TO MONITOR.   02/27/12 Jaliah Foody RN,BSN NCM [phone]

## 2012-02-27 NOTE — Progress Notes (Signed)
Pt had a Potassium of 3.0. [place], Np notified new orders given. Will continue to monitor the patient. Roslyn Smiling, RN

## 2012-02-27 NOTE — Progress Notes (Signed)
[facility] PROGRESS NOTE  [name] [id number] DOB: 04/05/1971 DOA: 02/25/2012 PCP: [name], MD  Assessment/Plan: Active Problems:  Gastroparesis  Hypokalemia  Ileus  Dehydration   1 ileus/abdominal pain  -Likely secondary gastroparesis and use of opioids.  Is no definite evidence of obstruction, will not place NG tube. We will treat the patient symptomatically with antiemetics: Phenergan and Reglan.  -need to be cautious with use of narcotics, but pt still with c/o requiring increased meds for pain control -CT of abd and pelvis with no evidence of acute abnl. -continue pain mangement -GI consult for further recs, follow  #2 gastroparesis. Secondary to diabetes. Continue Reglan as stated above. -GI consult for further recs as above  #3 hypokalemia.  -replace k, check mg level #4 dehydration. Hydrate with ivf.  #5 diabetes mellitus.  -keep n.p.o., hydrate with ivf -pt with hypoglycemic episodes, will dc lantus, till able to tolerate po #6 CODE STATUS: Full code  #7 DVT prophylaxis. Lovenox #8h/o chronic pancreatitis/chronic pain -lipase wnl, CT abd as above, pain management  Code Status:full  Disposition Plan: home when medically stable   Brief narrative: This is a 42 year old African American female with a history of diabetes with gastroparesis, chronic pancreatitis, hypertension, gastroesophageal reflux disease, coronary artery disease, COPD, neuropathy admitted with severe 10 out of 10 diffuse abdominal pain that started earlier on day of admission, abd x-ray done in ED was c/w ileus, no obstruction.   Consultants:  none  Procedures:  Central line - per Dr Wilson Singer in ED on 7/27  Antibiotics:  NONE  HPI/Subjective: Continues to have increased abd. Pain - +nausea. States she tried some sprite last pm but was unable to keep it down.  Objective: Filed Vitals:   02/26/12 2020 02/26/12 2041 02/27/12 0536 02/27/12 0924  BP:  120/77 153/94   Pulse:   82 76   Temp:  98.6 F (37 C) 98.2 F (36.8 C)   TempSrc:  Oral Oral   Resp:  18 18   Height:      Weight:   57.4 kg (126 lb 8.7 oz)   SpO2: 98% 100% 100% 82%    Intake/Output Summary (Last 24 hours) at 02/27/12 1424 Last data filed at 02/27/12 0911  Gross per 24 hour  Intake   1556 ml  Output   1200 ml  Net    356 ml    Exam:   General: middle aged BF in moderate distress 2/2 pain  Cardiovascular: RRR  Respiratory: clear, no crackles  Abdomen: soft, +BS, diffusely tender, no rebound, no masses plapable  Data Reviewed: Basic Metabolic Panel:  Lab [id number] 0500 02/26/12 0504 02/25/12 1751  NA 136 135 132*  K 3.0* 3.8 2.7*  CL 100 98 87*  CO2 26 27 34*  GLUCOSE 79 137* 284*  BUN 9 13 19   CREATININE 0.55 0.53 0.87  CALCIUM 8.2* 7.9* 9.3  MG -- -- --  PHOS -- -- --   Liver Function Tests:  Lab 02/25/12 1751  AST 23  ALT 9  ALKPHOS 102  BILITOT 0.2*  PROT 7.3  ALBUMIN 3.1*    Lab 02/25/12 1751  LIPASE 13  AMYLASE --   No results found for this basename: AMMONIA:5 in the last 168 hours CBC:  Lab 02/27/12 0500 02/25/12 1751  WBC 7.4 11.3*  NEUTROABS -- 8.4*  HGB 11.3* 13.6  HCT 33.0* 38.4  MCV 86.8 84.6  PLT 200 298   Cardiac Enzymes: No results found for this basename: CKTOTAL:5,CKMB:5,CKMBINDEX:5,TROPONINI:5  in the last 168 hours BNP (last 3 results) No results found for this basename: PROBNP:3 in the last 8760 hours CBG:  Lab 02/27/12 1150 02/27/12 0813 02/27/12 0747 02/26/12 2143 02/26/12 1803  GLUCAP 57* 104* 63* 74 78    No results found for this or any previous visit (from the past 240 hour(s)).   Studies: Dg Abd 1 View  02/25/2012  *RADIOLOGY REPORT*  Clinical Data: Abdominal pain, vomiting  ABDOMEN - 1 VIEW  Comparison: 02/09/2012  Findings: Post cholecystectomy surgical clips are noted.  Stable postsurgical changes lumbar spine.  Mild gaseous distended bowel loops mid abdomen suspicious for ileus.  IMPRESSION: Gaseous distended  bowel loops mid abdomen suspicious for ileus. Stable postsurgical changes.  Original Report Authenticated By: Lahoma Crocker, M.D.   Ir Fluoro Guide Cv Line Right  02/10/2012  *RADIOLOGY REPORT*  Clinical Data: Nausea  and vomiting, history of chronic pancreatitis; request is made for central venous access  for fluids and medications.  PICC LINE PLACEMENT WITH ULTRASOUND AND FLUOROSCOPIC  GUIDANCE  Fluoroscopy Time: 1.3 minutes.  The right  arm was prepped with chlorhexidine, draped in the usual sterile fashion using maximum barrier technique (cap and mask, sterile gown, sterile gloves, large sterile sheet, hand hygiene and cutaneous antisepsis) and infiltrated locally with 1% Lidocaine.  Ultrasound demonstrated patency of the right brachial vein, and this was documented with an image.  Under real-time ultrasound guidance, this vein was accessed with a 21 gauge micropuncture needle and image documentation was performed.  The needle was exchanged over a guidewire for a peel-away sheath through which a five [place] double lumen PICC trimmed to 38 cm was advanced, positioned with its tip at the lower SVC/right atrial junction. Fluoroscopy during the procedure and fluoro spot radiograph confirms appropriate catheter position.  The catheter was flushed, secured to the skin with Prolene sutures, and covered with a sterile dressing.  Complications:  None  IMPRESSION: Successful right arm PICC line placement with ultrasound and fluoroscopic guidance.  The catheter is ready for use.  Read by: Rowe Robert, P.A.-C  Original Report Authenticated By: Azzie Roup, M.D.   Ir US Guide Vasc Access Right  02/24/2012  *RADIOLOGY REPORT*  Clinical Data: Nausea  and vomiting, history of chronic pancreatitis; request is made for central venous access  for fluids and medications.  PICC LINE PLACEMENT WITH ULTRASOUND AND FLUOROSCOPIC  GUIDANCE  Fluoroscopy Time: 1.3 minutes.  The right  arm was prepped with chlorhexidine, draped in  the usual sterile fashion using maximum barrier technique (cap and mask, sterile gown, sterile gloves, large sterile sheet, hand hygiene and cutaneous antisepsis) and infiltrated locally with 1% Lidocaine.  Ultrasound demonstrated patency of the right brachial vein, and this was documented with an image.  Under real-time ultrasound guidance, this vein was accessed with a 21 gauge micropuncture needle and image documentation was performed.  The needle was exchanged over a guidewire for a peel-away sheath through which a five [place] double lumen PICC trimmed to 38 cm was advanced, positioned with its tip at the lower SVC/right atrial junction. Fluoroscopy during the procedure and fluoro spot radiograph confirms appropriate catheter position.  The catheter was flushed, secured to the skin with Prolene sutures, and covered with a sterile dressing.  Complications:  None  IMPRESSION: Successful right arm PICC line placement with ultrasound and fluoroscopic guidance.  The catheter is ready for use.  Read by: Rowe Robert, P.A.-C  Original Report Authenticated By: Azzie Roup, M.D.  Dg Abd Acute W/chest  02/09/2012  *RADIOLOGY REPORT*  Clinical Data: Vomiting, abdominal pain  ACUTE ABDOMEN SERIES (ABDOMEN 2 VIEW & CHEST 1 VIEW)  Comparison: 01/12/2012  Findings: Nasogastric tube extends into stomach though the proximal side port is at the distal esophagus. Right arm PICC line, tip projecting over cavoatrial junction. Normal heart size, mediastinal contours, and pulmonary vascularity. Lungs clear. Numerous cardiac monitoring leads project over chest. Prior L4-L5 fusion and L4 laminectomy.  Surgical clips right upper quadrant question cholecystectomy. Metallic foreign body question BB projects over left pelvis. Marked osseous demineralization. Nonobstructive bowel gas pattern. No definite bowel dilatation, bowel wall thickening or free intraperitoneal air. Gas collection identified in pelvis, uncertain if  represents bowel or air filled bladder. Anastomotic staple lines from bowel surgery in the left mid abdomen. Single tiny questionable nonobstructing right renal calculus.  IMPRESSION: Recommend advancing nasogastric tube 8 cm to place the proximal side port in the proximal stomach. Nonobstructive bowel gas pattern though a paucity of bowel gas is noted. Rounded gas collection in the pelvis, question bowel versus air filled urinary bladder; has the patient been recently catheterized?  Original Report Authenticated By: Burnetta Sabin, M.D.    Scheduled Meds:    . dextrose      . dextrose      . enoxaparin (LOVENOX) injection  40 mg Subcutaneous Q24H  . fentaNYL  25 mcg Transdermal Q72H  . Fluticasone-Salmeterol  1 puff Inhalation BID  .  HYDROmorphone (DILAUDID) injection  3 mg Intravenous Once  .  HYDROmorphone (DILAUDID) injection  3 mg Intravenous Once  . insulin glargine  5 Units Subcutaneous QHS  . lisinopril  20 mg Oral Daily  . metoCLOPramide (REGLAN) injection  10 mg Intravenous Q6H  . metoprolol succinate  50 mg Oral Daily  . pantoprazole (PROTONIX) IV  40 mg Intravenous BID  . potassium chloride  10 mEq Intravenous Q1 Hr x 4  . scopolamine  1 patch Transdermal Q72H  . sodium chloride  3 mL Intravenous Q12H  . DISCONTD:  HYDROmorphone (DILAUDID) injection  3 mg Intravenous Once  . DISCONTD: pantoprazole (PROTONIX) IV  40 mg Intravenous Q24H   Continuous Infusions:    . dextrose 5 % and 0.9% NaCl 75 mL/hr at 02/27/12 1215  . DISCONTD: sodium chloride 100 mL/hr at 02/27/12 1048    Active Problems:  Gastroparesis  Hypokalemia  Ileus  Dehydration    Time spent: 80mins    Tryone Kille C  [facility] Pager [phone]. If 8PM-8AM, please contact night-coverage at [email or web address], password [facility] 02/27/2012, 2:24 PM  LOS: 2 days

## 2012-02-27 NOTE — Progress Notes (Signed)
cbg 63, 1amp d50 given.

## 2012-02-27 NOTE — Consult Note (Signed)
[facility] CONSULT Reason for consult: Nausea and vomiting  Referring Physician:  [facility]  Primary Dr [name] is an 42 y.o. female.  HPI: She is admitted with nausea and vomiting. She was admitted to this hospital 7/11 --7/15 with nausea and vomiting and abdominal pain. She states that she went home and felt a little better but had to return. She has a very long and complex GI history most of which has not been documented hearing [name]. He previously was a resident of [place] and saw the physicians there for a number of years. She has had diabetes since childhood and has had gastroparesis for a number of years. Her history is quite good and she has had documented gastroparesis with abnormal emptying scans and endoscopies in the past. She has been her on Reglan and Phenergan for a number of years. During this time, she has also had cholecystectomy, colon resection due to diverticulitis, and multiple PEG tube/surgical feeding gastrostomy tubes placed. We do not have any records in any of this they were all done in [place]. She carries diagnoses of diabetic gastroparesis. Chronic pancreatitis. Chronic abdominal pain requiring narcotic therapy, cyclic vomiting syndrome, malnourishment. She states she has had previous colonoscopies in [place] but it is been approximately 8 years. During her recent hospitalization she did require NG suction. She continues to have daily abdominal pain and nausea currently has been unable to keep down food, liquids, or medications. She states that her abdominal pain is worse. She has had some bowel movements passing air. Patient had a CT scan of the abdomen on this admission showing no acute abnormalities. She does have a chronically thickened bladder and has had a previous history of multiple urinary tract infections. She did have an ultrasound or an ER visit 12/12 and was reasonably normal other than evidence of previous  cholecystectomy. We are asked to see her to assist in management of her chronic abdominal pain and her nausea and vomiting.  Past Medical History  Diagnosis Date  . Hypertension   . Diabetes mellitus   . Gastroparesis   . Asthma   . GERD (gastroesophageal reflux disease)   . Coronary artery disease   . h/o seizure   . Neuropathy   . Chronic pancreatitis   . Dyslipidemia   . MI (myocardial infarction)   . COPD (chronic obstructive pulmonary disease)     Past Surgical History  Procedure Date  . Abdominal hysterectomy   . Back surgery   . Cholecystectomy   . Peg tube x 4     feeding jejunostomies with PEG tubes  . Cesarean section   . Colon resection due to diverticulitis     Family History  Problem Relation Age of Onset  . Cancer Mother   No Fam Hist of GI cancer  Social History:  reports that she has been smoking Cigarettes.  She has a .5 pack-year smoking history. She has never used smokeless tobacco. She reports that she does not drink alcohol or use illicit drugs.  Allergies:  Allergies  Allergen Reactions  . Penicillins Hives  . Compazine Hives  . Darvocet (Propoxyphene-Acetaminophen) Other (See Comments)    UNKNOWN  . Nsaids Other (See Comments)    UNKNOWN  . Zofran Hives    Medications;    . dextrose      . dextrose      . enoxaparin (LOVENOX) injection  40 mg Subcutaneous Q24H  . fentaNYL  25 mcg Transdermal Q72H  .  Fluticasone-Salmeterol  1 puff Inhalation BID  .  HYDROmorphone (DILAUDID) injection  3 mg Intravenous Once  .  HYDROmorphone (DILAUDID) injection  3 mg Intravenous Once  . insulin glargine  5 Units Subcutaneous QHS  . lisinopril  20 mg Oral Daily  . metoCLOPramide (REGLAN) injection  10 mg Intravenous Q6H  . metoprolol succinate  50 mg Oral Daily  . pantoprazole (PROTONIX) IV  40 mg Intravenous BID  . potassium chloride  10 mEq Intravenous Q1 Hr x 4  . scopolamine  1 patch Transdermal Q72H  . sodium chloride  3 mL Intravenous Q12H    . DISCONTD:  HYDROmorphone (DILAUDID) injection  3 mg Intravenous Once  . DISCONTD: pantoprazole (PROTONIX) IV  40 mg Intravenous Q24H   PRN Meds acetaminophen, acetaminophen, alum & mag hydroxide-simeth, docusate sodium, HYDROmorphone (DILAUDID) injection, promethazine, zolpidem, DISCONTD:  HYDROmorphone (DILAUDID) injection Results for orders placed during the hospital encounter of 02/25/12 (from the past 48 hour(s))  GLUCOSE, CAPILLARY     Status: Abnormal   Collection Time   02/25/12  4:43 PM      Component Value Range Comment   Glucose-Capillary 311 (*) 70 - 99 mg/dL   LACTIC ACID, PLASMA     Status: Normal   Collection Time   02/25/12  5:50 PM      Component Value Range Comment   Lactic Acid, Venous 1.1  0.5 - 2.2 mmol/L   LIPASE, BLOOD     Status: Normal   Collection Time   02/25/12  5:51 PM      Component Value Range Comment   Lipase 13  11 - 59 U/L   COMPREHENSIVE METABOLIC PANEL     Status: Abnormal   Collection Time   02/25/12  5:51 PM      Component Value Range Comment   Sodium 132 (*) 135 - 145 mEq/L    Potassium 2.7 (*) 3.5 - 5.1 mEq/L    Chloride 87 (*) 96 - 112 mEq/L    CO2 34 (*) 19 - 32 mEq/L    Glucose, Bld 284 (*) 70 - 99 mg/dL    BUN 19  6 - 23 mg/dL    Creatinine, Ser 0.87  0.50 - 1.10 mg/dL    Calcium 9.3  8.4 - 10.5 mg/dL    Total Protein 7.3  6.0 - 8.3 g/dL    Albumin 3.1 (*) 3.5 - 5.2 g/dL    AST 23  0 - 37 U/L    ALT 9  0 - 35 U/L    Alkaline Phosphatase 102  39 - 117 U/L    Total Bilirubin 0.2 (*) 0.3 - 1.2 mg/dL    GFR calc non Af Amer 81 (*) >90 mL/min    GFR calc Af Amer >90  >90 mL/min   CBC WITH DIFFERENTIAL     Status: Abnormal   Collection Time   02/25/12  5:51 PM      Component Value Range Comment   WBC 11.3 (*) 4.0 - 10.5 K/uL    RBC 4.54  3.87 - 5.11 MIL/uL    Hemoglobin 13.6  12.0 - 15.0 g/dL    HCT 38.4  36.0 - 46.0 %    MCV 84.6  78.0 - 100.0 fL    MCH 30.0  26.0 - 34.0 pg    MCHC 35.4  30.0 - 36.0 g/dL    RDW 14.1  11.5 - 15.5  %    Platelets 298  150 - 400 K/uL  Neutrophils Relative 74  43 - 77 %    Neutro Abs 8.4 (*) 1.7 - 7.7 K/uL    Lymphocytes Relative 21  12 - 46 %    Lymphs Abs 2.3  0.7 - 4.0 K/uL    Monocytes Relative 5  3 - 12 %    Monocytes Absolute 0.6  0.1 - 1.0 K/uL    Eosinophils Relative 0  0 - 5 %    Eosinophils Absolute 0.0  0.0 - 0.7 K/uL    Basophils Relative 0  0 - 1 %    Basophils Absolute 0.0  0.0 - 0.1 K/uL   GLUCOSE, CAPILLARY     Status: Abnormal   Collection Time   02/25/12  9:44 PM      Component Value Range Comment   Glucose-Capillary 196 (*) 70 - 99 mg/dL    Comment 1 Notify RN     URINALYSIS, ROUTINE W REFLEX MICROSCOPIC     Status: Abnormal   Collection Time   02/25/12 10:26 PM      Component Value Range Comment   Color, Urine YELLOW  YELLOW    APPearance CLOUDY (*) CLEAR    Specific Gravity, Urine 1.017  1.005 - 1.030    pH 6.0  5.0 - 8.0    Glucose, UA 500 (*) NEGATIVE mg/dL    Hgb urine dipstick LARGE (*) NEGATIVE    Bilirubin Urine NEGATIVE  NEGATIVE    Ketones, ur NEGATIVE  NEGATIVE mg/dL    Protein, ur >300 (*) NEGATIVE mg/dL    Urobilinogen, UA 0.2  0.0 - 1.0 mg/dL    Nitrite NEGATIVE  NEGATIVE    Leukocytes, UA NEGATIVE  NEGATIVE   URINE MICROSCOPIC-ADD ON     Status: Abnormal   Collection Time   02/25/12 10:26 PM      Component Value Range Comment   Squamous Epithelial / LPF FEW (*) RARE    WBC, UA 7-10  <3 WBC/hpf    RBC / HPF 11-20  <3 RBC/hpf    Bacteria, UA MANY (*) RARE   BASIC METABOLIC PANEL     Status: Abnormal   Collection Time   02/26/12  5:04 AM      Component Value Range Comment   Sodium 135  135 - 145 mEq/L    Potassium 3.8  3.5 - 5.1 mEq/L    Chloride 98  96 - 112 mEq/L    CO2 27  19 - 32 mEq/L    Glucose, Bld 137 (*) 70 - 99 mg/dL    BUN 13  6 - 23 mg/dL    Creatinine, Ser 0.53  0.50 - 1.10 mg/dL    Calcium 7.9 (*) 8.4 - 10.5 mg/dL    GFR calc non Af Amer >90  >90 mL/min    GFR calc Af Amer >90  >90 mL/min   GLUCOSE, CAPILLARY      Status: Abnormal   Collection Time   02/26/12  7:39 AM      Component Value Range Comment   Glucose-Capillary 108 (*) 70 - 99 mg/dL    Comment 1 Notify RN     GLUCOSE, CAPILLARY     Status: Normal   Collection Time   02/26/12 11:49 AM      Component Value Range Comment   Glucose-Capillary 98  70 - 99 mg/dL    Comment 1 Notify RN     GLUCOSE, CAPILLARY     Status: Normal   Collection Time   02/26/12  6:03 PM      Component Value Range Comment   Glucose-Capillary 78  70 - 99 mg/dL    Comment 1 Notify RN     GLUCOSE, CAPILLARY     Status: Normal   Collection Time   02/26/12  9:43 PM      Component Value Range Comment   Glucose-Capillary 74  70 - 99 mg/dL    Comment 1 Notify RN     CBC     Status: Abnormal   Collection Time   02/27/12  5:00 AM      Component Value Range Comment   WBC 7.4  4.0 - 10.5 K/uL    RBC 3.80 (*) 3.87 - 5.11 MIL/uL    Hemoglobin 11.3 (*) 12.0 - 15.0 g/dL    HCT 33.0 (*) 36.0 - 46.0 %    MCV 86.8  78.0 - 100.0 fL    MCH 29.7  26.0 - 34.0 pg    MCHC 34.2  30.0 - 36.0 g/dL    RDW 14.2  11.5 - 15.5 %    Platelets 200  150 - 400 K/uL   BASIC METABOLIC PANEL     Status: Abnormal   Collection Time   02/27/12  5:00 AM      Component Value Range Comment   Sodium 136  135 - 145 mEq/L    Potassium 3.0 (*) 3.5 - 5.1 mEq/L DELTA CHECK NOTED   Chloride 100  96 - 112 mEq/L    CO2 26  19 - 32 mEq/L    Glucose, Bld 79  70 - 99 mg/dL    BUN 9  6 - 23 mg/dL    Creatinine, Ser 0.55  0.50 - 1.10 mg/dL    Calcium 8.2 (*) 8.4 - 10.5 mg/dL    GFR calc non Af Amer >90  >90 mL/min    GFR calc Af Amer >90  >90 mL/min   GLUCOSE, CAPILLARY     Status: Abnormal   Collection Time   02/27/12  7:47 AM      Component Value Range Comment   Glucose-Capillary 63 (*) 70 - 99 mg/dL   GLUCOSE, CAPILLARY     Status: Abnormal   Collection Time   02/27/12  8:13 AM      Component Value Range Comment   Glucose-Capillary 104 (*) 70 - 99 mg/dL   GLUCOSE, CAPILLARY     Status: Abnormal    Collection Time   02/27/12 11:50 AM      Component Value Range Comment   Glucose-Capillary 57 (*) 70 - 99 mg/dL     Ct Abdomen Pelvis Wo Contrast  02/26/2012  *RADIOLOGY REPORT*  Clinical Data: 41 year old female with abdominal and pelvic pain. History of previous bowel obstructions and bowel resections.  CT ABDOMEN AND PELVIS WITHOUT CONTRAST  Technique:  Multidetector CT imaging of the abdomen and pelvis was performed following the standard protocol without intravenous contrast. Intravenous contrast was not administered due to inability to gain IV access.  Comparison: 06/10/2011  Findings: The liver, spleen, kidneys, adrenal glands and pancreas are unremarkable. Please note that parenchymal abnormalities may be missed as intravenous contrast was not administered. The patient is status post cholecystectomy and hysterectomy.  Circumferentially thickened bladder with gas again noted - question chronic cystitis versus bladder hypertrophy with recent catheterization.  No free fluid, enlarged lymph nodes, biliary dilation or abdominal aortic aneurysm identified.  No evidence of previous bowel surgery noted. The visualized bowel is unremarkable.  There is no  evidence of bowel obstruction or pneumoperitoneum.  Foci of gas within the right gluteus maximus and anterior right pelvic subcutaneous tissues are likely related to injection sites but correlate clinically.  No acute or suspicious bony abnormalities are identified. Prior lower lumbar fusion again noted.  IMPRESSION: No evidence of acute abnormality.  Chronically thickened bladder containing gas again noted.  Question chronic cystitis versus bladder hypertrophy with recent catheterization.  Original Report Authenticated By: Lura Em, M.D.   Dg Abd 1 View  02/25/2012  *RADIOLOGY REPORT*  Clinical Data: Abdominal pain, vomiting  ABDOMEN - 1 VIEW  Comparison: 02/09/2012  Findings: Post cholecystectomy surgical clips are noted.  Stable postsurgical changes  lumbar spine.  Mild gaseous distended bowel loops mid abdomen suspicious for ileus.  IMPRESSION: Gaseous distended bowel loops mid abdomen suspicious for ileus. Stable postsurgical changes.  Original Report Authenticated By: Lahoma Crocker, M.D.   ROS: Constitutional: Severe fatigue, nausea and abdominal pain HEENT:neg Cardiovascular:hist of prior MI Respiratory: COPD continued smoking GI: Please see present illness. Patient has problems swallowing with discomfort and pain with swallowing and postprandial epigastric pain and nausea. No history of dilatation. Long history of reflux on PPI therapy for years. Last colonoscopy about 8-10 years ago in [place]. Patient denies recent hematochezia but has had colonic stools for the past week. GU: Chronic urinary tract infections Musculoskeletal: DJD Neuro/Psychiatric: Neuropathy presumably secondary diabetes.  Endocrine/Heme: Diabetes            Blood pressure 153/94, pulse 76, temperature 98.2 F (36.8 C), temperature source Oral, resp. rate 18, height 5\' 4"  (1.626 m), weight 57.4 kg (126 lb 8.7 oz), SpO2 82.00%.  Physical exam:   Gen.-alert African American female who answers questions appropriately and is in no severe distress Eyes-sclerae are nonicteric Throat-mucous membranes dry no obvious oral thrush Lungs clear Heart--regular rate and rhythm without murmurs or gallops Abdomen-nondistended with active bowel sounds marked upper abdominal tenderness.  Assessment: 1. Nausea and vomiting. Probably due to chronic gastroparesis. She has had a quite complicated history in [place] city with multiple problems requiring gastrostomies in jejunostomies for feeding. At this point in time she should probably have another workup just to be sure she does not have an active ulcer. She states that her stools have been dark over the past several days. 2. Chronic abdominal pain on narcotics. Questionably due to pancreatitis, gastroparesis, neuropathy et  Ronney Asters. With melenic stools should be evaluated for ulcer.3. Diabetes with probable gastroparesis and neuropathy    Plan: 1. Continue current medications for now. We will go ahead and arrange EGD tomorrow to evaluate for possible ulcer. This is negative we'll likely go ahead and obtain gastric emptying scan. At this point, would continue with IV Reglan and n.p.o..Avoid narcotics since this does clearly worsen gastroparesis.   Hortense Cantrall JR,Ralphine Hinks L 02/27/2012, 2:22 PM

## 2012-02-28 ENCOUNTER — Encounter (HOSPITAL_COMMUNITY)
Admission: EM | Disposition: A | Discharge status: Home / Self Care | Payer: Self-pay | Source: Home / Self Care | Attending: Internal Medicine

## 2012-02-28 ENCOUNTER — Encounter (HOSPITAL_COMMUNITY): Payer: Self-pay | Admitting: *Deleted

## 2012-02-28 DIAGNOSIS — E876 Hypokalemia: Secondary | ICD-10-CM

## 2012-02-28 DIAGNOSIS — E860 Dehydration: Secondary | ICD-10-CM

## 2012-02-28 LAB — GLUCOSE, CAPILLARY
Glucose-Capillary: 108 mg/dL — ABNORMAL HIGH (ref 70–99)
Glucose-Capillary: 93 mg/dL (ref 70–99)
Glucose-Capillary: 127 mg/dL — ABNORMAL HIGH (ref 70–99)
Glucose-Capillary: 246 mg/dL — ABNORMAL HIGH (ref 70–99)

## 2012-02-28 LAB — BASIC METABOLIC PANEL
Calcium: 8.2 mg/dL — ABNORMAL LOW (ref 8.4–10.5)
GFR calc non Af Amer: >90 mL/min (ref >90)
Glucose, Bld: 129 mg/dL — ABNORMAL HIGH (ref 70–99)
Sodium: 136 mEq/L (ref 135–145)

## 2012-02-28 LAB — MAGNESIUM: Magnesium: 1.3 mg/dL — ABNORMAL LOW (ref 1.5–2.5)

## 2012-02-28 SURGERY — EGD (ESOPHAGOGASTRODUODENOSCOPY)
Anesthesia: Moderate Sedation

## 2012-02-28 SURGERY — Surgical Case

## 2012-02-28 MED ORDER — SODIUM CHLORIDE 0.9 % IJ SOLN
10.0000 mL | INTRAMUSCULAR | Status: DC | PRN
  Administered 2012-02-28 – 2012-03-01 (×3): 10 mL
  Administered 2012-03-01: 20 mL
  Administered 2012-03-02 – 2012-03-07 (×4): 10 mL
  Administered 2012-03-09: 20 mL
  Administered 2012-03-11 (×2): 10 mL
  Administered 2012-03-11: 20 mL
  Administered 2012-03-13 (×2): 10 mL

## 2012-02-28 MED ORDER — FENTANYL CITRATE 0.05 MG/ML IJ SOLN
INTRAMUSCULAR | Status: DC | PRN
  Administered 2012-02-28 (×4): 25 ug via INTRAVENOUS

## 2012-02-28 MED ORDER — DIPHENHYDRAMINE HCL 50 MG/ML IJ SOLN
INTRAMUSCULAR | Status: AC
  Filled 2012-02-28: qty 1

## 2012-02-28 MED ORDER — INSULIN ASPART 100 UNIT/ML ~~LOC~~ SOLN
0.0000 [IU] | Freq: Three times a day (TID) | SUBCUTANEOUS | Status: DC
  Administered 2012-02-29: 1 [IU] via SUBCUTANEOUS
  Administered 2012-02-29: 2 [IU] via SUBCUTANEOUS
  Administered 2012-03-01 – 2012-03-02 (×3): 1 [IU] via SUBCUTANEOUS
  Administered 2012-03-03 – 2012-03-04 (×3): 2 [IU] via SUBCUTANEOUS
  Administered 2012-03-04: 1 [IU] via SUBCUTANEOUS
  Administered 2012-03-04 – 2012-03-05 (×2): 2 [IU] via SUBCUTANEOUS
  Administered 2012-03-05: 1 [IU] via SUBCUTANEOUS
  Administered 2012-03-06: 2 [IU] via SUBCUTANEOUS
  Administered 2012-03-06: 1 [IU] via SUBCUTANEOUS
  Administered 2012-03-07: 2 [IU] via SUBCUTANEOUS
  Administered 2012-03-07 – 2012-03-10 (×4): 1 [IU] via SUBCUTANEOUS
  Administered 2012-03-11 (×2): 2 [IU] via SUBCUTANEOUS
  Administered 2012-03-11 – 2012-03-12 (×3): 1 [IU] via SUBCUTANEOUS
  Administered 2012-03-12: 2 [IU] via SUBCUTANEOUS
  Administered 2012-03-13 – 2012-03-14 (×2): 1 [IU] via SUBCUTANEOUS

## 2012-02-28 MED ORDER — MAGNESIUM SULFATE 40 MG/ML IJ SOLN
4.0000 g | Freq: Once | INTRAMUSCULAR | Status: AC
  Administered 2012-02-28: 4 g via INTRAVENOUS
  Filled 2012-02-28: qty 100

## 2012-02-28 MED ORDER — DIPHENHYDRAMINE HCL 50 MG/ML IJ SOLN
INTRAMUSCULAR | Status: DC | PRN
  Administered 2012-02-28: 25 mg via INTRAVENOUS

## 2012-02-28 MED ORDER — POTASSIUM CHLORIDE 10 MEQ/100ML IV SOLN
10.0000 meq | INTRAVENOUS | Status: AC
  Administered 2012-02-28 (×2): 10 meq via INTRAVENOUS
  Filled 2012-02-28 (×4): qty 100

## 2012-02-28 MED ORDER — MIDAZOLAM HCL 10 MG/2ML IJ SOLN
INTRAMUSCULAR | Status: AC
  Filled 2012-02-28: qty 4

## 2012-02-28 MED ORDER — FENTANYL CITRATE 0.05 MG/ML IJ SOLN
INTRAMUSCULAR | Status: AC
  Filled 2012-02-28: qty 4

## 2012-02-28 MED ORDER — MIDAZOLAM HCL 10 MG/2ML IJ SOLN
INTRAMUSCULAR | Status: DC | PRN
  Administered 2012-02-28 (×4): 2.5 mg via INTRAVENOUS

## 2012-02-28 MED ORDER — BUTAMBEN-TETRACAINE-BENZOCAINE 2-2-14 % EX AERO
INHALATION_SPRAY | CUTANEOUS | Status: DC | PRN
  Administered 2012-02-28: 2 via TOPICAL

## 2012-02-28 NOTE — Progress Notes (Signed)
[facility] PROGRESS NOTE  [name] [id number] DOB: [birth year] DOA: 02/25/2012 PCP: [name], MD  Assessment/Plan: Active Problems:  Gastroparesis  Hypokalemia  Ileus  Dehydration   1 ileus/abdominal pain  -Likely secondary gastroparesis and use of opioids.   -Continue symptomatic treatment/supportive care Phenergan and Reglan.  -need to be cautious with use of narcotics, but pt still with c/o requiring increased meds for pain control -CT of abd and pelvis with no evidence of acute abnl. -Appreciate GI assistance-patient taken for endoscopy but was unable to pass gastroscope as she was thrashing her arms and legs -Regular diet restarted per GI but patient unable to tolerate>> change back to n.p.o. for now. - #2 gastroparesis. Secondary to diabetes. Continue Reglan as stated above. -GI to follow for further recommendations #3 hypokalemia &hypomagnesemia.  -replace k, also replace magnesium #4 dehydration. Hydrate with ivf.  #5 diabetes mellitus.  -Sliding scale coverage, follow resume Lantus when able to tolerate by mouth's. #6 CODE STATUS: Full code  #7 DVT prophylaxis. Lovenox #8h/o chronic pancreatitis/chronic pain -lipase wnl, CT abd as above, pain management  Code Status:full  Disposition Plan: home when medically stable   Brief narrative: This is a 41 year old African American female with a history of diabetes with gastroparesis, chronic pancreatitis, hypertension, gastroesophageal reflux disease, coronary artery disease, COPD, neuropathy admitted with severe 10 out of 10 diffuse abdominal pain that started earlier on day of admission, abd x-ray done in ED was c/w ileus, no obstruction.   Consultants:  none  Procedures:  Central line - per Dr Wilson Singer in ED on 7/27  Antibiotics:  NONE  HPI/Subjective: In tears with increased pain status post eating regular diet after successful attempt of endoscopy.  Objective: Filed Vitals:   02/28/12 1410  02/28/12 1420 02/28/12 1430 02/28/12 1456  BP: 124/75 132/85 123/72 148/93  Pulse:    80  Temp:    98 F (36.7 C)  TempSrc:    Oral  Resp: 9 11 15 20   Height:      Weight:      SpO2:  100% 98% 97%    Intake/Output Summary (Last 24 hours) at 02/28/12 1918 Last data filed at 02/28/12 1846  Gross per 24 hour  Intake  882.5 ml  Output   3400 ml  Net -2517.5 ml    Exam:   General: middle aged BF in distress 2/2 pain  Cardiovascular: RRR  Respiratory: clear, no crackles  Abdomen: soft, +BS, diffusely tender, no rebound, no masses plapable  Data Reviewed: Basic Metabolic Panel:  Lab [id number] 0400 02/27/12 0500 02/26/12 0504 02/25/12 1751  NA 136 136 135 132*  K 3.2* 3.0* 3.8 2.7*  CL 102 100 98 87*  CO2 27 26 27  34*  GLUCOSE 129* 79 137* 284*  BUN 4* 9 13 19   CREATININE 0.52 0.55 0.53 0.87  CALCIUM 8.2* 8.2* 7.9* 9.3  MG 1.3* -- -- --  PHOS -- -- -- --   Liver Function Tests:  Lab 02/25/12 1751  AST 23  ALT 9  ALKPHOS 102  BILITOT 0.2*  PROT 7.3  ALBUMIN 3.1*    Lab 02/25/12 1751  LIPASE 13  AMYLASE --   No results found for this basename: AMMONIA:5 in the last 168 hours CBC:  Lab 02/27/12 0500 02/25/12 1751  WBC 7.4 11.3*  NEUTROABS -- 8.4*  HGB 11.3* 13.6  HCT 33.0* 38.4  MCV 86.8 84.6  PLT 200 298   Cardiac Enzymes: No results found for this basename: CKTOTAL:5,CKMB:5,CKMBINDEX:5,TROPONINI:5 in  the last 168 hours BNP (last 3 results) No results found for this basename: PROBNP:3 in the last 8760 hours CBG:  Lab 02/28/12 1634 02/28/12 1158 02/28/12 0746 02/27/12 2326 02/27/12 1630  GLUCAP 127* 93 108* 98 84    No results found for this or any previous visit (from the past 240 hour(s)).   Studies: Dg Abd 1 View  02/25/2012  *RADIOLOGY REPORT*  Clinical Data: Abdominal pain, vomiting  ABDOMEN - 1 VIEW  Comparison: 02/09/2012  Findings: Post cholecystectomy surgical clips are noted.  Stable postsurgical changes lumbar spine.  Mild gaseous  distended bowel loops mid abdomen suspicious for ileus.  IMPRESSION: Gaseous distended bowel loops mid abdomen suspicious for ileus. Stable postsurgical changes.  Original Report Authenticated By: Lahoma Crocker, M.D.   Ir Fluoro Guide Cv Line Right  02/10/2012  *RADIOLOGY REPORT*  Clinical Data: Nausea  and vomiting, history of chronic pancreatitis; request is made for central venous access  for fluids and medications.  PICC LINE PLACEMENT WITH ULTRASOUND AND FLUOROSCOPIC  GUIDANCE  Fluoroscopy Time: 1.3 minutes.  The right  arm was prepped with chlorhexidine, draped in the usual sterile fashion using maximum barrier technique (cap and mask, sterile gown, sterile gloves, large sterile sheet, hand hygiene and cutaneous antisepsis) and infiltrated locally with 1% Lidocaine.  Ultrasound demonstrated patency of the right brachial vein, and this was documented with an image.  Under real-time ultrasound guidance, this vein was accessed with a 21 gauge micropuncture needle and image documentation was performed.  The needle was exchanged over a guidewire for a peel-away sheath through which a five [place] double lumen PICC trimmed to 38 cm was advanced, positioned with its tip at the lower SVC/right atrial junction. Fluoroscopy during the procedure and fluoro spot radiograph confirms appropriate catheter position.  The catheter was flushed, secured to the skin with Prolene sutures, and covered with a sterile dressing.  Complications:  None  IMPRESSION: Successful right arm PICC line placement with ultrasound and fluoroscopic guidance.  The catheter is ready for use.  Read by: Rowe Robert, P.A.-C  Original Report Authenticated By: Azzie Roup, M.D.   Ir US Guide Vasc Access Right  02/24/2012  *RADIOLOGY REPORT*  Clinical Data: Nausea  and vomiting, history of chronic pancreatitis; request is made for central venous access  for fluids and medications.  PICC LINE PLACEMENT WITH ULTRASOUND AND FLUOROSCOPIC  GUIDANCE   Fluoroscopy Time: 1.3 minutes.  The right  arm was prepped with chlorhexidine, draped in the usual sterile fashion using maximum barrier technique (cap and mask, sterile gown, sterile gloves, large sterile sheet, hand hygiene and cutaneous antisepsis) and infiltrated locally with 1% Lidocaine.  Ultrasound demonstrated patency of the right brachial vein, and this was documented with an image.  Under real-time ultrasound guidance, this vein was accessed with a 21 gauge micropuncture needle and image documentation was performed.  The needle was exchanged over a guidewire for a peel-away sheath through which a five [place] double lumen PICC trimmed to 38 cm was advanced, positioned with its tip at the lower SVC/right atrial junction. Fluoroscopy during the procedure and fluoro spot radiograph confirms appropriate catheter position.  The catheter was flushed, secured to the skin with Prolene sutures, and covered with a sterile dressing.  Complications:  None  IMPRESSION: Successful right arm PICC line placement with ultrasound and fluoroscopic guidance.  The catheter is ready for use.  Read by: Rowe Robert, P.A.-C  Original Report Authenticated By: Azzie Roup, M.D.  Dg Abd Acute W/chest  02/09/2012  *RADIOLOGY REPORT*  Clinical Data: Vomiting, abdominal pain  ACUTE ABDOMEN SERIES (ABDOMEN 2 VIEW & CHEST 1 VIEW)  Comparison: 01/12/2012  Findings: Nasogastric tube extends into stomach though the proximal side port is at the distal esophagus. Right arm PICC line, tip projecting over cavoatrial junction. Normal heart size, mediastinal contours, and pulmonary vascularity. Lungs clear. Numerous cardiac monitoring leads project over chest. Prior L4-L5 fusion and L4 laminectomy.  Surgical clips right upper quadrant question cholecystectomy. Metallic foreign body question BB projects over left pelvis. Marked osseous demineralization. Nonobstructive bowel gas pattern. No definite bowel dilatation, bowel wall thickening  or free intraperitoneal air. Gas collection identified in pelvis, uncertain if represents bowel or air filled bladder. Anastomotic staple lines from bowel surgery in the left mid abdomen. Single tiny questionable nonobstructing right renal calculus.  IMPRESSION: Recommend advancing nasogastric tube 8 cm to place the proximal side port in the proximal stomach. Nonobstructive bowel gas pattern though a paucity of bowel gas is noted. Rounded gas collection in the pelvis, question bowel versus air filled urinary bladder; has the patient been recently catheterized?  Original Report Authenticated By: Burnetta Sabin, M.D.    Scheduled Meds:    . enoxaparin (LOVENOX) injection  40 mg Subcutaneous Q24H  . fentaNYL  25 mcg Transdermal Q72H  . Fluticasone-Salmeterol  1 puff Inhalation BID  . lisinopril  20 mg Oral Daily  . magnesium sulfate 1 - 4 g bolus IVPB  4 g Intravenous Once  . metoCLOPramide (REGLAN) injection  10 mg Intravenous Q6H  . metoprolol succinate  50 mg Oral Daily  . pantoprazole (PROTONIX) IV  40 mg Intravenous BID  . potassium chloride  10 mEq Intravenous Q1 Hr x 4  . potassium chloride  10 mEq Intravenous Q1 Hr x 4  . scopolamine  1 patch Transdermal Q72H  . sodium chloride  3 mL Intravenous Q12H  . DISCONTD: insulin glargine  5 Units Subcutaneous QHS   Continuous Infusions:    . dextrose 5 % and 0.9% NaCl 75 mL/hr at 02/28/12 1635    Active Problems:  Gastroparesis  Hypokalemia  Ileus  Dehydration    Time spent: 41mins    Barrington Worley C  [facility] Pager [phone]. If 8PM-8AM, please contact night-coverage at [email or web address], password [facility] 02/28/2012, 7:18 PM  LOS: 3 days

## 2012-02-28 NOTE — CV Procedure (Signed)
Endoscopy was attempted. The patient consented to the procedure and was sedated with fentanyl 100 mcg, Versed 10 mg, and Benadryl 25 mg as well as Cetacaine spray. She became quite agitated we were unable to pass the scope and the procedure was terminated. The scope never was able to be passed. If this procedure needs to be repeated, he will need to be repeated with MAC.

## 2012-02-28 NOTE — Progress Notes (Signed)
Unable to pass gastroscope patient is thrashing arms and legs unable to pass scope or hald patient still

## 2012-02-28 NOTE — Interval H&P Note (Signed)
History and Physical Interval Note:  02/28/2012 1:35 PM  [name]  has presented today for surgery, with the diagnosis of nausea and vomiting  The various methods of treatment have been discussed with the patient and family. After consideration of risks, benefits and other options for treatment, the patient has consented to  Procedure(s) (LRB): ESOPHAGOGASTRODUODENOSCOPY (EGD) (N/A) as a surgical intervention .  The patient's history has been reviewed, patient examined, no change in status, stable for surgery.  I have reviewed the patient's chart and labs.  Questions were answered to the patient's satisfaction.     Demetrius Mahler JR,Theresea Trautmann L

## 2012-02-29 ENCOUNTER — Encounter (HOSPITAL_COMMUNITY): Payer: Self-pay

## 2012-02-29 LAB — GLUCOSE, CAPILLARY
Glucose-Capillary: 149 mg/dL — ABNORMAL HIGH (ref 70–99)
Glucose-Capillary: 156 mg/dL — ABNORMAL HIGH (ref 70–99)
Glucose-Capillary: 143 mg/dL — ABNORMAL HIGH (ref 70–99)
Glucose-Capillary: 83 mg/dL (ref 70–99)
Glucose-Capillary: 108 mg/dL — ABNORMAL HIGH (ref 70–99)

## 2012-02-29 LAB — BASIC METABOLIC PANEL
CO2: 26 mEq/L (ref 19–32)
Glucose, Bld: 170 mg/dL — ABNORMAL HIGH (ref 70–99)
Potassium: 3.3 mEq/L — ABNORMAL LOW (ref 3.5–5.1)
Sodium: 135 mEq/L (ref 135–145)

## 2012-02-29 MED ORDER — POTASSIUM CHLORIDE CRYS ER 20 MEQ PO TBCR
40.0000 meq | EXTENDED_RELEASE_TABLET | Freq: Once | ORAL | Status: DC
  Filled 2012-02-29: qty 2

## 2012-02-29 NOTE — Progress Notes (Signed)
Dr. Oletta Lamas via phone made aware Pt went to Nuclear Medicine for Gastric Emptying Scan but refused test due to inability to eat food offered. Pt states "I can't eat eggs or oatmeal it will hurt my stomach"

## 2012-02-29 NOTE — Progress Notes (Signed)
[facility] PROGRESS NOTE Subjective Pt refused GE scan said that eggs and oatmeal made her sick. Still c/o pain and wants more pain meds.  Objective: Vital signs in last 24 hours: Temp:  [98 F (36.7 C)-98.3 F (36.8 C)] 98.3 F (36.8 C) (07/31 0515) Pulse Rate:  [78-140] 84  (07/31 0936) Resp:  [9-41] 18  (07/31 0515) BP: (119-176)/(39-107) 155/88 mmHg (07/31 0936) SpO2:  [81 %-100 %] 100 % (07/31 0515) Weight:  [57.1 kg (125 lb 14.1 oz)] 57.1 kg (125 lb 14.1 oz) (07/31 0515) Last BM Date: 02/27/12  Intake/Output from previous day: 07/30 0701 - 07/31 0700 In: 2022.5 [I.V.:2022.5] Out: 750 [Urine:750] Intake/Output this shift: Total I/O In: -  Out: 400 [Urine:400]  PE: Abd-+BSs, min tender  Lab Results:  Basename 02/27/12 0500  WBC 7.4  HGB 11.3*  HCT 33.0*  PLT 200   BMET  Basename 02/29/12 0413 02/28/12 0400 02/27/12 0500  NA 135 136 136  K 3.3* 3.2* 3.0*  CL 102 102 100  CO2 26 27 26   CREATININE 0.46* 0.52 0.55   LFT No results found for this basename: PROT:3ALBUMIN:3,AST:3,ALT:3,ALKPHOS:3,BILITOT:3,BILIDIR:3,IBILI:3 in the last 72 hours PT/INR No results found for this basename: LABPROT:3,INR:3 in the last 72 hours PANCREAS No results found for this basename: LIPASE:3 in the last 72 hours       Studies/Results: No results found.  Medications: I have reviewed the patient's current medications.  Assessment/Plan: 1. Abd Pain/Gastroparesis/Chronic pancreatitis. Continues to be difficult problem. Has required feeding tubes in the past. Refusing GE scan but will allow UGI  Will order.   Ladavion Savitz JR,Doryce Mcgregory L 02/29/2012, 1:02 PM

## 2012-02-29 NOTE — Progress Notes (Signed)
[facility] PROGRESS NOTE  TRINH CHARLESTON [id number] DOB: [birth year] DOA: 02/25/2012 PCP: [name], MD  Assessment/Plan:  Acute ileus / abdominal pain -Likely secondary gastroparesis and use of opioids.  -Continue symptomatic treatment/supportive care with Phenergan and Reglan.  -need to be cautious with use of narcotics, but pt still with c/o requiring increased meds for pain control  -CT of abd and pelvis with no evidence of acute obstrction -Appreciate GI recs. -patient taken for endoscopy but was unable to pass gastroscope as she was agitated -unable to tolerate diet and now NPO -planned for gastric emptying study today but patient refused to take eggs. Planned for upper GI series which she agrees to do.   gastroparesis.  Secondary to diabetes. Continue Reglan as  above.  -GI to follow for further recommendations    hypokalemia &hypomagnesemia.  -replaced k and mg   dehydration.  Cont IV fluids    chronic pancreatitis/chronic pain  -lipase wnl, CT abd as above, pain management   diabetes mellitus.  -Sliding scale insulin  CODE STATUS: Full code   DVT prophylaxis. Sq Lovenox     Code Status: full Family Communication: none Disposition Plan: home once stable   Brief narrative:   Consultants:  [facility] ( GI)  Procedures:  For upper GI series tomorrow  Antibiotics:  none  HPI/Subjective: Patient still has abdominal pain but slightly improved. No vomiting   Objective: Filed Vitals:   02/28/12 2108 02/29/12 0515 02/29/12 0936 02/29/12 1430  BP: 153/99 155/94 155/88 156/96  Pulse: 84 81 84 80  Temp: 98.3 F (36.8 C) 98.3 F (36.8 C)  97 F (36.1 C)  TempSrc: Oral Oral  Axillary  Resp: 19 18  19   Height:      Weight:  57.1 kg (125 lb 14.1 oz)    SpO2: 100% 100%  100%    Intake/Output Summary (Last 24 hours) at 02/29/12 1746 Last data filed at 02/29/12 0900  Gross per 24 hour  Intake 1422.5 ml  Output   1150 ml  Net  272.5 ml     Exam:   General:  NAD  HEENT: no pallor, moist oral mucosa,   Cardiovascular: NS1 &S2, no murmurs  Respiratory: clear b/l , no added sounds  Abdomen: soft, tender to palpation,, BS+  Ext: warm, no edema  CNS: AAOX3, non focal  Data Reviewed: Basic Metabolic Panel:  Lab [id number] 02/28/12 0400 02/27/12 0500 02/26/12 0504 02/25/12 1751  NA 135 136 136 135 132*  K 3.3* 3.2* 3.0* 3.8 2.7*  CL 102 102 100 98 87*  CO2 26 27 26 27  34*  GLUCOSE 170* 129* 79 137* 284*  BUN 3* 4* 9 13 19   CREATININE 0.46* 0.52 0.55 0.53 0.87  CALCIUM 8.4 8.2* 8.2* 7.9* 9.3  MG 1.6 1.3* -- -- --  PHOS -- -- -- -- --   Liver Function Tests:  Lab 02/25/12 1751  AST 23  ALT 9  ALKPHOS 102  BILITOT 0.2*  PROT 7.3  ALBUMIN 3.1*    Lab 02/25/12 1751  LIPASE 13  AMYLASE --   No results found for this basename: AMMONIA:5 in the last 168 hours CBC:  Lab 02/27/12 0500 02/25/12 1751  WBC 7.4 11.3*  NEUTROABS -- 8.4*  HGB 11.3* 13.6  HCT 33.0* 38.4  MCV 86.8 84.6  PLT 200 298   Cardiac Enzymes: No results found for this basename: CKTOTAL:5,CKMB:5,CKMBINDEX:5,TROPONINI:5 in the last 168 hours BNP (last 3 results) No results found for this basename: PROBNP:3  in the last 8760 hours CBG:  Lab 02/29/12 1716 02/29/12 1156 02/29/12 0730 02/29/12 0353 02/28/12 2138  GLUCAP 83 143* 156* 149* 246*    No results found for this or any previous visit (from the past 240 hour(s)).   Studies: Ct Abdomen Pelvis Wo Contrast  02/26/2012  *RADIOLOGY REPORT*  Clinical Data: 41 year old female with abdominal and pelvic pain. History of previous bowel obstructions and bowel resections.  CT ABDOMEN AND PELVIS WITHOUT CONTRAST  Technique:  Multidetector CT imaging of the abdomen and pelvis was performed following the standard protocol without intravenous contrast. Intravenous contrast was not administered due to inability to gain IV access.  Comparison: 06/10/2011  Findings: The liver, spleen,  kidneys, adrenal glands and pancreas are unremarkable. Please note that parenchymal abnormalities may be missed as intravenous contrast was not administered. The patient is status post cholecystectomy and hysterectomy.  Circumferentially thickened bladder with gas again noted - question chronic cystitis versus bladder hypertrophy with recent catheterization.  No free fluid, enlarged lymph nodes, biliary dilation or abdominal aortic aneurysm identified.  No evidence of previous bowel surgery noted. The visualized bowel is unremarkable.  There is no evidence of bowel obstruction or pneumoperitoneum.  Foci of gas within the right gluteus maximus and anterior right pelvic subcutaneous tissues are likely related to injection sites but correlate clinically.  No acute or suspicious bony abnormalities are identified. Prior lower lumbar fusion again noted.  IMPRESSION: No evidence of acute abnormality.  Chronically thickened bladder containing gas again noted.  Question chronic cystitis versus bladder hypertrophy with recent catheterization.  Original Report Authenticated By: Lura Em, M.D.   Dg Abd 1 View  02/25/2012  *RADIOLOGY REPORT*  Clinical Data: Abdominal pain, vomiting  ABDOMEN - 1 VIEW  Comparison: 02/09/2012  Findings: Post cholecystectomy surgical clips are noted.  Stable postsurgical changes lumbar spine.  Mild gaseous distended bowel loops mid abdomen suspicious for ileus.  IMPRESSION: Gaseous distended bowel loops mid abdomen suspicious for ileus. Stable postsurgical changes.  Original Report Authenticated By: Lahoma Crocker, M.D.   Ir Fluoro Guide Cv Line Right  02/10/2012  *RADIOLOGY REPORT*  Clinical Data: Nausea  and vomiting, history of chronic pancreatitis; request is made for central venous access  for fluids and medications.  PICC LINE PLACEMENT WITH ULTRASOUND AND FLUOROSCOPIC  GUIDANCE  Fluoroscopy Time: 1.3 minutes.  The right  arm was prepped with chlorhexidine, draped in the usual sterile  fashion using maximum barrier technique (cap and mask, sterile gown, sterile gloves, large sterile sheet, hand hygiene and cutaneous antisepsis) and infiltrated locally with 1% Lidocaine.  Ultrasound demonstrated patency of the right brachial vein, and this was documented with an image.  Under real-time ultrasound guidance, this vein was accessed with a 21 gauge micropuncture needle and image documentation was performed.  The needle was exchanged over a guidewire for a peel-away sheath through which a five [place] double lumen PICC trimmed to 38 cm was advanced, positioned with its tip at the lower SVC/right atrial junction. Fluoroscopy during the procedure and fluoro spot radiograph confirms appropriate catheter position.  The catheter was flushed, secured to the skin with Prolene sutures, and covered with a sterile dressing.  Complications:  None  IMPRESSION: Successful right arm PICC line placement with ultrasound and fluoroscopic guidance.  The catheter is ready for use.  Read by: Rowe Robert, P.A.-C  Original Report Authenticated By: Azzie Roup, M.D.   Ir US Guide Vasc Access Right  02/24/2012  *RADIOLOGY REPORT*  Clinical Data:  Nausea  and vomiting, history of chronic pancreatitis; request is made for central venous access  for fluids and medications.  PICC LINE PLACEMENT WITH ULTRASOUND AND FLUOROSCOPIC  GUIDANCE  Fluoroscopy Time: 1.3 minutes.  The right  arm was prepped with chlorhexidine, draped in the usual sterile fashion using maximum barrier technique (cap and mask, sterile gown, sterile gloves, large sterile sheet, hand hygiene and cutaneous antisepsis) and infiltrated locally with 1% Lidocaine.  Ultrasound demonstrated patency of the right brachial vein, and this was documented with an image.  Under real-time ultrasound guidance, this vein was accessed with a 21 gauge micropuncture needle and image documentation was performed.  The needle was exchanged over a guidewire for a peel-away  sheath through which a five [place] double lumen PICC trimmed to 38 cm was advanced, positioned with its tip at the lower SVC/right atrial junction. Fluoroscopy during the procedure and fluoro spot radiograph confirms appropriate catheter position.  The catheter was flushed, secured to the skin with Prolene sutures, and covered with a sterile dressing.  Complications:  None  IMPRESSION: Successful right arm PICC line placement with ultrasound and fluoroscopic guidance.  The catheter is ready for use.  Read by: Rowe Robert, P.A.-C  Original Report Authenticated By: Azzie Roup, M.D.   Dg Abd Acute W/chest  02/09/2012  *RADIOLOGY REPORT*  Clinical Data: Vomiting, abdominal pain  ACUTE ABDOMEN SERIES (ABDOMEN 2 VIEW & CHEST 1 VIEW)  Comparison: 01/12/2012  Findings: Nasogastric tube extends into stomach though the proximal side port is at the distal esophagus. Right arm PICC line, tip projecting over cavoatrial junction. Normal heart size, mediastinal contours, and pulmonary vascularity. Lungs clear. Numerous cardiac monitoring leads project over chest. Prior L4-L5 fusion and L4 laminectomy.  Surgical clips right upper quadrant question cholecystectomy. Metallic foreign body question BB projects over left pelvis. Marked osseous demineralization. Nonobstructive bowel gas pattern. No definite bowel dilatation, bowel wall thickening or free intraperitoneal air. Gas collection identified in pelvis, uncertain if represents bowel or air filled bladder. Anastomotic staple lines from bowel surgery in the left mid abdomen. Single tiny questionable nonobstructing right renal calculus.  IMPRESSION: Recommend advancing nasogastric tube 8 cm to place the proximal side port in the proximal stomach. Nonobstructive bowel gas pattern though a paucity of bowel gas is noted. Rounded gas collection in the pelvis, question bowel versus air filled urinary bladder; has the patient been recently catheterized?  Original Report  Authenticated By: Burnetta Sabin, M.D.    Scheduled Meds:   . enoxaparin (LOVENOX) injection  40 mg Subcutaneous Q24H  . fentaNYL  25 mcg Transdermal Q72H  . Fluticasone-Salmeterol  1 puff Inhalation BID  . insulin aspart  0-9 Units Subcutaneous TID WC  . lisinopril  20 mg Oral Daily  . metoCLOPramide (REGLAN) injection  10 mg Intravenous Q6H  . metoprolol succinate  50 mg Oral Daily  . pantoprazole (PROTONIX) IV  40 mg Intravenous BID  . potassium chloride  10 mEq Intravenous Q1 Hr x 4  . scopolamine  1 patch Transdermal Q72H  . sodium chloride  3 mL Intravenous Q12H   Continuous Infusions:   . dextrose 5 % and 0.9% NaCl 75 mL/hr at 02/29/12 1549      Time spent: 35 minutes    Yarelie Hams, [place]  [facility] Pager [phone]. If 8PM-8AM, please contact night-coverage at [email or web address], password [facility] 02/29/2012, 5:46 PM  LOS: 4 days

## 2012-03-01 ENCOUNTER — Inpatient Hospital Stay (HOSPITAL_COMMUNITY): Payer: PRIVATE HEALTH INSURANCE

## 2012-03-01 LAB — GLUCOSE, CAPILLARY
Glucose-Capillary: 133 mg/dL — ABNORMAL HIGH (ref 70–99)
Glucose-Capillary: 150 mg/dL — ABNORMAL HIGH (ref 70–99)
Glucose-Capillary: 105 mg/dL — ABNORMAL HIGH (ref 70–99)
Glucose-Capillary: 110 mg/dL — ABNORMAL HIGH (ref 70–99)
Glucose-Capillary: 170 mg/dL — ABNORMAL HIGH (ref 70–99)

## 2012-03-01 MED ORDER — POTASSIUM CHLORIDE 2 MEQ/ML IV SOLN
INTRAVENOUS | Status: DC
  Administered 2012-03-01 – 2012-03-14 (×19): via INTRAVENOUS
  Filled 2012-03-01 (×37): qty 1000

## 2012-03-01 NOTE — Progress Notes (Signed)
[facility] PROGRESS NOTE  KEYSHAWN [name] [id number] DOB: 1970-08-09 DOA: 02/25/2012 PCP: [name], MD  Assessment/Plan:  Acute ileus / abdominal pain  -Likely secondary gastroparesis and use of opioids.  -Continue symptomatic treatment/supportive care with Phenergan and Reglan.  -need to be cautious with use of narcotics, but pt still with c/o requiring increased meds for pain control  -CT of abd and pelvis with no evidence of acute obstrction  -Appreciate GI recs. -patient taken for endoscopy but was unable to pass gastroscope as she was agitated  -Patient refused for gastric emptying study. Did not have full UGI series. GI plan on repeat EGD    gastroparesis.  Secondary to diabetes. Continue Reglan as above.  -GI to follow for further recommendations   hypokalemia  Replenish kcl  dehydration.  Cont IV fluids   chronic pancreatitis/chronic pain  -lipase wnl, CT abd as above, pain management   diabetes mellitus.  -Sliding scale insulin   CODE STATUS: Full code   DVT prophylaxis. Sq Lovenox   Code Status: full   Family Communication: none   Disposition Plan: home once stable   Brief narrative:    Consultants:  [facility] ( GI) Procedures:  For upper GI series tomorrow Antibiotics:  None  HPI/Subjective:  Patient still has abdominal pain but slightly improved. No vomiting. Wants to try clear.   Objective: Filed Vitals:   03/01/12 0533 03/01/12 0613 03/01/12 1052 03/01/12 1343  BP: 164/102 154/88  116/83  Pulse:   87 102  Temp:    98.4 F (36.9 C)  TempSrc:    Oral  Resp:    18  Height:      Weight:      SpO2:    98%    Intake/Output Summary (Last 24 hours) at 03/01/12 1554 Last data filed at 03/01/12 1300  Gross per 24 hour  Intake   1190 ml  Output   1750 ml  Net   -560 ml    Exam:  General: NAD  HEENT: no pallor, moist oral mucosa,  Cardiovascular: NS1 &S2, no murmurs  Respiratory: clear b/l , no added sounds  Abdomen: soft,  tender to palpation,, BS+  Ext: warm, no edema  CNS: AAOX3, non focal   Data Reviewed: Basic Metabolic Panel:  Lab [id number] 02/28/12 0400 02/27/12 0500 02/26/12 0504 02/25/12 1751  NA 135 136 136 135 132*  K 3.3* 3.2* 3.0* 3.8 2.7*  CL 102 102 100 98 87*  CO2 26 27 26 27  34*  GLUCOSE 170* 129* 79 137* 284*  BUN 3* 4* 9 13 19   CREATININE 0.46* 0.52 0.55 0.53 0.87  CALCIUM 8.4 8.2* 8.2* 7.9* 9.3  MG 1.6 1.3* -- -- --  PHOS -- -- -- -- --   Liver Function Tests:  Lab 02/25/12 1751  AST 23  ALT 9  ALKPHOS 102  BILITOT 0.2*  PROT 7.3  ALBUMIN 3.1*    Lab 02/25/12 1751  LIPASE 13  AMYLASE --   No results found for this basename: AMMONIA:5 in the last 168 hours CBC:  Lab 02/27/12 0500 02/25/12 1751  WBC 7.4 11.3*  NEUTROABS -- 8.4*  HGB 11.3* 13.6  HCT 33.0* 38.4  MCV 86.8 84.6  PLT 200 298   Cardiac Enzymes: No results found for this basename: CKTOTAL:5,CKMB:5,CKMBINDEX:5,TROPONINI:5 in the last 168 hours BNP (last 3 results) No results found for this basename: PROBNP:3 in the last 8760 hours CBG:  Lab 03/01/12 1145 03/01/12 0808 03/01/12 0520 02/29/12 2135 02/29/12 1716  GLUCAP 105* 150* 133* 108* 83    No results found for this or any previous visit (from the past 240 hour(s)).   Studies: Ct Abdomen Pelvis Wo Contrast  02/26/2012  *RADIOLOGY REPORT*  Clinical Data: 41 year old female with abdominal and pelvic pain. History of previous bowel obstructions and bowel resections.  CT ABDOMEN AND PELVIS WITHOUT CONTRAST  Technique:  Multidetector CT imaging of the abdomen and pelvis was performed following the standard protocol without intravenous contrast. Intravenous contrast was not administered due to inability to gain IV access.  Comparison: 06/10/2011  Findings: The liver, spleen, kidneys, adrenal glands and pancreas are unremarkable. Please note that parenchymal abnormalities may be missed as intravenous contrast was not administered. The patient is  status post cholecystectomy and hysterectomy.  Circumferentially thickened bladder with gas again noted - question chronic cystitis versus bladder hypertrophy with recent catheterization.  No free fluid, enlarged lymph nodes, biliary dilation or abdominal aortic aneurysm identified.  No evidence of previous bowel surgery noted. The visualized bowel is unremarkable.  There is no evidence of bowel obstruction or pneumoperitoneum.  Foci of gas within the right gluteus maximus and anterior right pelvic subcutaneous tissues are likely related to injection sites but correlate clinically.  No acute or suspicious bony abnormalities are identified. Prior lower lumbar fusion again noted.  IMPRESSION: No evidence of acute abnormality.  Chronically thickened bladder containing gas again noted.  Question chronic cystitis versus bladder hypertrophy with recent catheterization.  Original Report Authenticated By: Lura Em, M.D.   Dg Abd 1 View  02/25/2012  *RADIOLOGY REPORT*  Clinical Data: Abdominal pain, vomiting  ABDOMEN - 1 VIEW  Comparison: 02/09/2012  Findings: Post cholecystectomy surgical clips are noted.  Stable postsurgical changes lumbar spine.  Mild gaseous distended bowel loops mid abdomen suspicious for ileus.  IMPRESSION: Gaseous distended bowel loops mid abdomen suspicious for ileus. Stable postsurgical changes.  Original Report Authenticated By: Lahoma Crocker, M.D.   Dg Esophagus  03/01/2012  *RADIOLOGY REPORT*  Clinical Data: Nausea/vomiting, abdominal pain  ESOPHOGRAM/BARIUM SWALLOW  Technique:  Single contrast examination was performed using thick barium.  Fluoroscopy time:  41 seconds.  Comparison:  None.  Note:  The patient was unable to tolerate effervescent crystals and more than a single swallow of thick barium.  As such, following initial evaluation of the esophagus, the study was aborted.  Findings:  No laryngeal penetration or tracheal bronchial aspiration.  Esophageal peristalsis is relatively  preserved.  Although patent, the distal esophagus/lower esophageal sphincter remains narrowed throughout the majority of the evaluation. Therefore, a lower esophageal stricture cannot be excluded.  The stomach could not be satisfactorily evaluated.  IMPRESSION: The study was prematurely aborted as the patient could not tolerate more than a single swallow of thick barium.  Normal esophageal peristalsis.  Persistent narrowing of the distal esophagus/lower esophageal sphincter, although patent.  Lower esophageal stricture cannot be excluded.  Original Report Authenticated By: Julian Hy, M.D.   Ir Fluoro Guide Cv Line Right  02/10/2012  *RADIOLOGY REPORT*  Clinical Data: Nausea  and vomiting, history of chronic pancreatitis; request is made for central venous access  for fluids and medications.  PICC LINE PLACEMENT WITH ULTRASOUND AND FLUOROSCOPIC  GUIDANCE  Fluoroscopy Time: 1.3 minutes.  The right  arm was prepped with chlorhexidine, draped in the usual sterile fashion using maximum barrier technique (cap and mask, sterile gown, sterile gloves, large sterile sheet, hand hygiene and cutaneous antisepsis) and infiltrated locally with 1% Lidocaine.  Ultrasound demonstrated patency of the  right brachial vein, and this was documented with an image.  Under real-time ultrasound guidance, this vein was accessed with a 21 gauge micropuncture needle and image documentation was performed.  The needle was exchanged over a guidewire for a peel-away sheath through which a five [place] double lumen PICC trimmed to 38 cm was advanced, positioned with its tip at the lower SVC/right atrial junction. Fluoroscopy during the procedure and fluoro spot radiograph confirms appropriate catheter position.  The catheter was flushed, secured to the skin with Prolene sutures, and covered with a sterile dressing.  Complications:  None  IMPRESSION: Successful right arm PICC line placement with ultrasound and fluoroscopic guidance.  The  catheter is ready for use.  Read by: Rowe Robert, P.A.-C  Original Report Authenticated By: Azzie Roup, M.D.   Ir US Guide Vasc Access Right  02/24/2012  *RADIOLOGY REPORT*  Clinical Data: Nausea  and vomiting, history of chronic pancreatitis; request is made for central venous access  for fluids and medications.  PICC LINE PLACEMENT WITH ULTRASOUND AND FLUOROSCOPIC  GUIDANCE  Fluoroscopy Time: 1.3 minutes.  The right  arm was prepped with chlorhexidine, draped in the usual sterile fashion using maximum barrier technique (cap and mask, sterile gown, sterile gloves, large sterile sheet, hand hygiene and cutaneous antisepsis) and infiltrated locally with 1% Lidocaine.  Ultrasound demonstrated patency of the right brachial vein, and this was documented with an image.  Under real-time ultrasound guidance, this vein was accessed with a 21 gauge micropuncture needle and image documentation was performed.  The needle was exchanged over a guidewire for a peel-away sheath through which a five [place] double lumen PICC trimmed to 38 cm was advanced, positioned with its tip at the lower SVC/right atrial junction. Fluoroscopy during the procedure and fluoro spot radiograph confirms appropriate catheter position.  The catheter was flushed, secured to the skin with Prolene sutures, and covered with a sterile dressing.  Complications:  None  IMPRESSION: Successful right arm PICC line placement with ultrasound and fluoroscopic guidance.  The catheter is ready for use.  Read by: Rowe Robert, P.A.-C  Original Report Authenticated By: Azzie Roup, M.D.   Dg Abd Acute W/chest  02/09/2012  *RADIOLOGY REPORT*  Clinical Data: Vomiting, abdominal pain  ACUTE ABDOMEN SERIES (ABDOMEN 2 VIEW & CHEST 1 VIEW)  Comparison: 01/12/2012  Findings: Nasogastric tube extends into stomach though the proximal side port is at the distal esophagus. Right arm PICC line, tip projecting over cavoatrial junction. Normal heart size,  mediastinal contours, and pulmonary vascularity. Lungs clear. Numerous cardiac monitoring leads project over chest. Prior L4-L5 fusion and L4 laminectomy.  Surgical clips right upper quadrant question cholecystectomy. Metallic foreign body question BB projects over left pelvis. Marked osseous demineralization. Nonobstructive bowel gas pattern. No definite bowel dilatation, bowel wall thickening or free intraperitoneal air. Gas collection identified in pelvis, uncertain if represents bowel or air filled bladder. Anastomotic staple lines from bowel surgery in the left mid abdomen. Single tiny questionable nonobstructing right renal calculus.  IMPRESSION: Recommend advancing nasogastric tube 8 cm to place the proximal side port in the proximal stomach. Nonobstructive bowel gas pattern though a paucity of bowel gas is noted. Rounded gas collection in the pelvis, question bowel versus air filled urinary bladder; has the patient been recently catheterized?  Original Report Authenticated By: Burnetta Sabin, M.D.    Scheduled Meds:   . enoxaparin (LOVENOX) injection  40 mg Subcutaneous Q24H  . fentaNYL  25 mcg Transdermal Q72H  .  Fluticasone-Salmeterol  1 puff Inhalation BID  . insulin aspart  0-9 Units Subcutaneous TID WC  . lisinopril  20 mg Oral Daily  . metoCLOPramide (REGLAN) injection  10 mg Intravenous Q6H  . metoprolol succinate  50 mg Oral Daily  . pantoprazole (PROTONIX) IV  40 mg Intravenous BID  . potassium chloride  40 mEq Oral Once  . scopolamine  1 patch Transdermal Q72H  . sodium chloride  3 mL Intravenous Q12H   Continuous Infusions:   . dextrose 5 % and 0.9% NaCl 1,000 mL with potassium chloride 20 mEq infusion 100 mL/hr at 03/01/12 0925  . DISCONTD: dextrose 5 % and 0.9% NaCl 75 mL/hr at 03/01/12 0354      Time spent: [place], [place] [facility] Pager [phone]. If 8PM-8AM, please contact night-coverage at [email or web address], password [facility] 03/01/2012, 3:54 PM   LOS: 5 days

## 2012-03-01 NOTE — Progress Notes (Signed)
[facility] PROGRESS NOTE Subjective Full UGI not done for unclear reasons esophagus study poor. Procedure was aborted since pt wouldn't swallow the barium. Says she may be a little better. Still needs pain meds.  Objective: Vital signs in last 24 hours: Temp:  [97 F (36.1 C)-98.8 F (37.1 C)] 98.8 F (37.1 C) (08/01 0521) Pulse Rate:  [80-98] 87  (08/01 1052) Resp:  [18-19] 18  (08/01 0521) BP: (154-164)/(88-102) 154/88 mmHg (08/01 0613) SpO2:  [97 %-100 %] 97 % (08/01 0521) Last BM Date: 02/27/12  Intake/Output from previous day: 07/31 0701 - 08/01 0700 In: 1940 [I.V.:1940] Out: 1600 [Urine:1600] Intake/Output this shift: Total I/O In: -  Out: 550 [Urine:550]  PE: Gen-Pt in no distress playing on computer. Abd--min tender  Lab Results: No results found for this basename: WBC:5,HGB:5,HCT:5,PLT:5 in the last 72 hours BMET  Basename 02/29/12 0413 02/28/12 0400  NA 135 136  K 3.3* 3.2*  CL 102 102  CO2 26 27  CREATININE 0.46* 0.52   LFT No results found for this basename: PROT:3ALBUMIN:3,AST:3,ALT:3,ALKPHOS:3,BILITOT:3,BILIDIR:3,IBILI:3 in the last 72 hours PT/INR No results found for this basename: LABPROT:3,INR:3 in the last 72 hours PANCREAS No results found for this basename: LIPASE:3 in the last 72 hours       Studies/Results: Dg Esophagus  03/01/2012  *RADIOLOGY REPORT*  Clinical Data: Nausea/vomiting, abdominal pain  ESOPHOGRAM/BARIUM SWALLOW  Technique:  Single contrast examination was performed using thick barium.  Fluoroscopy time:  41 seconds.  Comparison:  None.  Note:  The patient was unable to tolerate effervescent crystals and more than a single swallow of thick barium.  As such, following initial evaluation of the esophagus, the study was aborted.  Findings:  No laryngeal penetration or tracheal bronchial aspiration.  Esophageal peristalsis is relatively preserved.  Although patent, the distal esophagus/lower esophageal sphincter remains  narrowed throughout the majority of the evaluation. Therefore, a lower esophageal stricture cannot be excluded.  The stomach could not be satisfactorily evaluated.  IMPRESSION: The study was prematurely aborted as the patient could not tolerate more than a single swallow of thick barium.  Normal esophageal peristalsis.  Persistent narrowing of the distal esophagus/lower esophageal sphincter, although patent.  Lower esophageal stricture cannot be excluded.  Original Report Authenticated By: Julian Hy, M.D.    Medications: I have reviewed the patient's current medications.  Assessment/Plan: 1. AP/nausea. Pt has not tolerated any diagnostic test so far. Will probably need another attempt at EGD but will have to be done with MAC and no slots available tomorrow. Will see how she does on clear liquids.   Lucrecia Mcphearson JR,Keyleigh Manninen L 03/01/2012, 1:11 PM

## 2012-03-02 DIAGNOSIS — R112 Nausea with vomiting, unspecified: Secondary | ICD-10-CM

## 2012-03-02 LAB — GLUCOSE, CAPILLARY
Glucose-Capillary: 144 mg/dL — ABNORMAL HIGH (ref 70–99)
Glucose-Capillary: 148 mg/dL — ABNORMAL HIGH (ref 70–99)
Glucose-Capillary: 99 mg/dL (ref 70–99)
Glucose-Capillary: 201 mg/dL — ABNORMAL HIGH (ref 70–99)

## 2012-03-02 MED ORDER — SODIUM CHLORIDE 0.9 % IV SOLN
250.0000 mg | Freq: Four times a day (QID) | INTRAVENOUS | Status: DC
  Administered 2012-03-02 – 2012-03-14 (×49): 250 mg via INTRAVENOUS
  Filled 2012-03-02 (×61): qty 250

## 2012-03-02 NOTE — Progress Notes (Signed)
[facility] PROGRESS NOTE Subjective Patient states that she was only able to tolerate a small glass of Sprite because of severe abdominal pain. She has been followed in [place] in the past for gastroparesis chronic abdominal pain and chronic pancreatitis. She has been on chronic narcotics for some time and apparently in the past has had surgical feeding tubes. Here at [facility] she has been unable to tolerate EGD, unable to perform gastric emptying scan, and unable to do a full upper GI series. Continues to have pain.  Objective: Vital signs in last 24 hours: Temp:  [98.4 F (36.9 C)-98.8 F (37.1 C)] 98.8 F (37.1 C) (08/01 2132) Pulse Rate:  [79-102] 85  (08/02 0507) Resp:  [18] 18  (08/02 0507) BP: (116-145)/(83-90) 135/88 mmHg (08/02 0507) SpO2:  [98 %-100 %] 100 % (08/02 0507) Last BM Date: 02/27/12  Intake/Output from previous day: 08/01 0701 - 08/02 0700 In: 1160 [I.V.:1160] Out: 2000 [Urine:2000] Intake/Output this shift:    PE: Gen.-patient sitting up in bed watching a movie on her laptop in no distress.  Abdomen-subjective tenderness. This is diffuse with no localization. No distention in good bowel sounds.  Lab Results: No results found for this basename: WBC:5,HGB:5,HCT:5,PLT:5 in the last 72 hours BMET  [facility] 02/29/12 0413  NA 135  K 3.3*  CL 102  CO2 26  CREATININE 0.46*   LFT No results found for this basename: PROT:3ALBUMIN:3,AST:3,ALT:3,ALKPHOS:3,BILITOT:3,BILIDIR:3,IBILI:3 in the last 72 hours PT/INR No results found for this basename: LABPROT:3,INR:3 in the last 72 hours PANCREAS No results found for this basename: LIPASE:3 in the last 72 hours       Studies/Results: Dg Esophagus  03/01/2012  *RADIOLOGY REPORT*  Clinical Data: Nausea/vomiting, abdominal pain  ESOPHOGRAM/BARIUM SWALLOW  Technique:  Single contrast examination was performed using thick barium.  Fluoroscopy time:  41 seconds.  Comparison:  None.  Note:   The patient was unable to tolerate effervescent crystals and more than a single swallow of thick barium.  As such, following initial evaluation of the esophagus, the study was aborted.  Findings:  No laryngeal penetration or tracheal bronchial aspiration.  Esophageal peristalsis is relatively preserved.  Although patent, the distal esophagus/lower esophageal sphincter remains narrowed throughout the majority of the evaluation. Therefore, a lower esophageal stricture cannot be excluded.  The stomach could not be satisfactorily evaluated.  IMPRESSION: The study was prematurely aborted as the patient could not tolerate more than a single swallow of thick barium.  Normal esophageal peristalsis.  Persistent narrowing of the distal esophagus/lower esophageal sphincter, although patent.  Lower esophageal stricture cannot be excluded.  Original Report Authenticated By: Julian Hy, M.D.    Medications: I have reviewed the patient's current medications.  Assessment/Plan: 1. Chronic abdominal pain. Probably due to gastroparesis and/or chronic pancreatitis. Patient apparently has required feeding jejunostomy in the past. 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 She clearly has high requirement for narcotics and this is certainly making her symptoms worse. She is refusing any diagnostic test.  Plan: At this point we'll try some IV erythromycin to see if that helps. I have told her that if she does not make some attempt to eat in drink and she will likely need another feeding jejunostomy. I think she will certainly need referral to a pain clinic to manage her chronic narcotics use. Unfortunately, the hospital is not currently have any spots available today to allow [place] to repeat EGD with MAC. She has not improved she would need repeat EGD next week with sedation  with MAC.   Maly Lemarr JR,Katya Rolston L 03/02/2012, 8:07 AM

## 2012-03-02 NOTE — Progress Notes (Signed)
[facility] PROGRESS NOTE  CORDELLA STINNER [id number] DOB: 22-Apr-1971 DOA: 02/25/2012 PCP: [name], MD  Assessment/Plan:  Acute ileus / abdominal pain  -Likely secondary gastroparesis and use of chronic opioids.  -Continue symptomatic treatment/supportive care with Phenergan and Reglan. Patient has not improved and not able to tolerate diet. Started on IV erythromycin todayfor 3 days. She has required feeding tube in past -need to be cautious with use of narcotics, but pt still with c/o requiring increased meds for pain control  -CT of abd and pelvis with no evidence of acute obstrction  -Appreciate GI recs. -patient taken for endoscopy but was unable to pass gastroscope as she was agitated  -Patient refused for gastric emptying study. Did not have full UGI series.  GI plan on repeat EGD likely next week.  gastroparesis.  Secondary to diabetes. Continue Reglan as above. Added erythromycin for 3 days -GI to follow with  further recommendations   hypokalemia  Replenished kcl   dehydration.  Cont IV fluids   chronic pancreatitis/chronic pain  -lipase wnl, CT abd as above, pain management   diabetes mellitus.  -Sliding scale insulin    DVT prophylaxis. Sq Lovenox   Code Status: full   Family Communication: none   Disposition Plan: home once stable   Brief narrative:  41 year old African American female with a history of diabetes with gastroparesis, chronic pancreatitis, hypertension, gastroesophageal reflux disease, coronary artery disease, COPD, neuropathy admitted with diffuse abdominal pain due to gastroparesis vs chronic pancreatitis  Consultants:  [facility] ( GI) Procedures:  For upper GI series tomorrow Antibiotics:  None  HPI/Subjective:  Patient still has abdominal pain ongoing and not tolerating diet well.   Objective: Filed Vitals:   03/02/12 0507 03/02/12 0818 03/02/12 0938 03/02/12 1517  BP: 135/88  163/89 150/90  Pulse: 85   82  Temp:     99.3 F (37.4 C)  TempSrc:    Oral  Resp: 18   18  Height:      Weight:      SpO2: 100% 96%  100%    Intake/Output Summary (Last 24 hours) at 03/02/12 1726 Last data filed at 03/02/12 0508  Gross per 24 hour  Intake    910 ml  Output   1000 ml  Net    -90 ml    Exam: General: NAD  HEENT: no pallor, moist oral mucosa,  Cardiovascular: NS1 &S2, no murmurs  Respiratory: clear b/l , no added sounds  Abdomen: soft, tender to palpation,, BS+  Ext: warm, no edema  CNS: AAOX3, non focal   Data Reviewed: Basic Metabolic Panel:  Lab [id number] 02/28/12 0400 02/27/12 0500 02/26/12 0504 02/25/12 1751  NA 135 136 136 135 132*  K 3.3* 3.2* 3.0* 3.8 2.7*  CL 102 102 100 98 87*  CO2 26 27 26 27  34*  GLUCOSE 170* 129* 79 137* 284*  BUN 3* 4* 9 13 19   CREATININE 0.46* 0.52 0.55 0.53 0.87  CALCIUM 8.4 8.2* 8.2* 7.9* 9.3  MG 1.6 1.3* -- -- --  PHOS -- -- -- -- --   Liver Function Tests:  Lab 02/25/12 1751  AST 23  ALT 9  ALKPHOS 102  BILITOT 0.2*  PROT 7.3  ALBUMIN 3.1*    Lab 02/25/12 1751  LIPASE 13  AMYLASE --   No results found for this basename: AMMONIA:5 in the last 168 hours CBC:  Lab 02/27/12 0500 02/25/12 1751  WBC 7.4 11.3*  NEUTROABS -- 8.4*  HGB 11.3* 13.6  HCT 33.0* 38.4  MCV 86.8 84.6  PLT 200 298   Cardiac Enzymes: No results found for this basename: CKTOTAL:5,CKMB:5,CKMBINDEX:5,TROPONINI:5 in the last 168 hours BNP (last 3 results) No results found for this basename: PROBNP:3 in the last 8760 hours CBG:  Lab 03/02/12 1656 03/02/12 1218 03/02/12 0747 03/01/12 2130 03/01/12 1710  GLUCAP 99 148* 144* 170* 110*    No results found for this or any previous visit (from the past 240 hour(s)).   Studies: Ct Abdomen Pelvis Wo Contrast  02/26/2012  *RADIOLOGY REPORT*  Clinical Data: 41 year old female with abdominal and pelvic pain. History of previous bowel obstructions and bowel resections.  CT ABDOMEN AND PELVIS WITHOUT CONTRAST  Technique:   Multidetector CT imaging of the abdomen and pelvis was performed following the standard protocol without intravenous contrast. Intravenous contrast was not administered due to inability to gain IV access.  Comparison: 06/10/2011  Findings: The liver, spleen, kidneys, adrenal glands and pancreas are unremarkable. Please note that parenchymal abnormalities may be missed as intravenous contrast was not administered. The patient is status post cholecystectomy and hysterectomy.  Circumferentially thickened bladder with gas again noted - question chronic cystitis versus bladder hypertrophy with recent catheterization.  No free fluid, enlarged lymph nodes, biliary dilation or abdominal aortic aneurysm identified.  No evidence of previous bowel surgery noted. The visualized bowel is unremarkable.  There is no evidence of bowel obstruction or pneumoperitoneum.  Foci of gas within the right gluteus maximus and anterior right pelvic subcutaneous tissues are likely related to injection sites but correlate clinically.  No acute or suspicious bony abnormalities are identified. Prior lower lumbar fusion again noted.  IMPRESSION: No evidence of acute abnormality.  Chronically thickened bladder containing gas again noted.  Question chronic cystitis versus bladder hypertrophy with recent catheterization.  Original Report Authenticated By: Lura Em, M.D.   Dg Abd 1 View  02/25/2012  *RADIOLOGY REPORT*  Clinical Data: Abdominal pain, vomiting  ABDOMEN - 1 VIEW  Comparison: 02/09/2012  Findings: Post cholecystectomy surgical clips are noted.  Stable postsurgical changes lumbar spine.  Mild gaseous distended bowel loops mid abdomen suspicious for ileus.  IMPRESSION: Gaseous distended bowel loops mid abdomen suspicious for ileus. Stable postsurgical changes.  Original Report Authenticated By: Lahoma Crocker, M.D.   Dg Esophagus  03/01/2012  *RADIOLOGY REPORT*  Clinical Data: Nausea/vomiting, abdominal pain  ESOPHOGRAM/BARIUM SWALLOW   Technique:  Single contrast examination was performed using thick barium.  Fluoroscopy time:  41 seconds.  Comparison:  None.  Note:  The patient was unable to tolerate effervescent crystals and more than a single swallow of thick barium.  As such, following initial evaluation of the esophagus, the study was aborted.  Findings:  No laryngeal penetration or tracheal bronchial aspiration.  Esophageal peristalsis is relatively preserved.  Although patent, the distal esophagus/lower esophageal sphincter remains narrowed throughout the majority of the evaluation. Therefore, a lower esophageal stricture cannot be excluded.  The stomach could not be satisfactorily evaluated.  IMPRESSION: The study was prematurely aborted as the patient could not tolerate more than a single swallow of thick barium.  Normal esophageal peristalsis.  Persistent narrowing of the distal esophagus/lower esophageal sphincter, although patent.  Lower esophageal stricture cannot be excluded.  Original Report Authenticated By: Julian Hy, M.D.   Ir Fluoro Guide Cv Line Right  02/10/2012  *RADIOLOGY REPORT*  Clinical Data: Nausea  and vomiting, history of chronic pancreatitis; request is made for central venous access  for fluids and medications.  PICC LINE PLACEMENT  WITH ULTRASOUND AND FLUOROSCOPIC  GUIDANCE  Fluoroscopy Time: 1.3 minutes.  The right  arm was prepped with chlorhexidine, draped in the usual sterile fashion using maximum barrier technique (cap and mask, sterile gown, sterile gloves, large sterile sheet, hand hygiene and cutaneous antisepsis) and infiltrated locally with 1% Lidocaine.  Ultrasound demonstrated patency of the right brachial vein, and this was documented with an image.  Under real-time ultrasound guidance, this vein was accessed with a 21 gauge micropuncture needle and image documentation was performed.  The needle was exchanged over a guidewire for a peel-away sheath through which a five [place] double lumen PICC  trimmed to 38 cm was advanced, positioned with its tip at the lower SVC/right atrial junction. Fluoroscopy during the procedure and fluoro spot radiograph confirms appropriate catheter position.  The catheter was flushed, secured to the skin with Prolene sutures, and covered with a sterile dressing.  Complications:  None  IMPRESSION: Successful right arm PICC line placement with ultrasound and fluoroscopic guidance.  The catheter is ready for use.  Read by: Rowe Robert, P.A.-C  Original Report Authenticated By: Azzie Roup, M.D.   Ir US Guide Vasc Access Right  02/24/2012  *RADIOLOGY REPORT*  Clinical Data: Nausea  and vomiting, history of chronic pancreatitis; request is made for central venous access  for fluids and medications.  PICC LINE PLACEMENT WITH ULTRASOUND AND FLUOROSCOPIC  GUIDANCE  Fluoroscopy Time: 1.3 minutes.  The right  arm was prepped with chlorhexidine, draped in the usual sterile fashion using maximum barrier technique (cap and mask, sterile gown, sterile gloves, large sterile sheet, hand hygiene and cutaneous antisepsis) and infiltrated locally with 1% Lidocaine.  Ultrasound demonstrated patency of the right brachial vein, and this was documented with an image.  Under real-time ultrasound guidance, this vein was accessed with a 21 gauge micropuncture needle and image documentation was performed.  The needle was exchanged over a guidewire for a peel-away sheath through which a five [place] double lumen PICC trimmed to 38 cm was advanced, positioned with its tip at the lower SVC/right atrial junction. Fluoroscopy during the procedure and fluoro spot radiograph confirms appropriate catheter position.  The catheter was flushed, secured to the skin with Prolene sutures, and covered with a sterile dressing.  Complications:  None  IMPRESSION: Successful right arm PICC line placement with ultrasound and fluoroscopic guidance.  The catheter is ready for use.  Read by: Rowe Robert, P.A.-C   Original Report Authenticated By: Azzie Roup, M.D.   Dg Abd Acute W/chest  02/09/2012  *RADIOLOGY REPORT*  Clinical Data: Vomiting, abdominal pain  ACUTE ABDOMEN SERIES (ABDOMEN 2 VIEW & CHEST 1 VIEW)  Comparison: 01/12/2012  Findings: Nasogastric tube extends into stomach though the proximal side port is at the distal esophagus. Right arm PICC line, tip projecting over cavoatrial junction. Normal heart size, mediastinal contours, and pulmonary vascularity. Lungs clear. Numerous cardiac monitoring leads project over chest. Prior L4-L5 fusion and L4 laminectomy.  Surgical clips right upper quadrant question cholecystectomy. Metallic foreign body question BB projects over left pelvis. Marked osseous demineralization. Nonobstructive bowel gas pattern. No definite bowel dilatation, bowel wall thickening or free intraperitoneal air. Gas collection identified in pelvis, uncertain if represents bowel or air filled bladder. Anastomotic staple lines from bowel surgery in the left mid abdomen. Single tiny questionable nonobstructing right renal calculus.  IMPRESSION: Recommend advancing nasogastric tube 8 cm to place the proximal side port in the proximal stomach. Nonobstructive bowel gas pattern though a paucity of  bowel gas is noted. Rounded gas collection in the pelvis, question bowel versus air filled urinary bladder; has the patient been recently catheterized?  Original Report Authenticated By: Burnetta Sabin, M.D.    Scheduled Meds:   . enoxaparin (LOVENOX) injection  40 mg Subcutaneous Q24H  . erythromycin  250 mg Intravenous Q6H  . fentaNYL  25 mcg Transdermal Q72H  . Fluticasone-Salmeterol  1 puff Inhalation BID  . insulin aspart  0-9 Units Subcutaneous TID WC  . lisinopril  20 mg Oral Daily  . metoCLOPramide (REGLAN) injection  10 mg Intravenous Q6H  . metoprolol succinate  50 mg Oral Daily  . pantoprazole (PROTONIX) IV  40 mg Intravenous BID  . potassium chloride  40 mEq Oral Once  .  scopolamine  1 patch Transdermal Q72H  . sodium chloride  3 mL Intravenous Q12H   Continuous Infusions:   . dextrose 5 % and 0.9% NaCl 1,000 mL with potassium chloride 20 mEq infusion 100 mL/hr at 03/02/12 1654      Time spent: 30 minutes    Inez Stantz, [place]  [facility] Pager [phone]. If 8PM-8AM, please contact night-coverage at [email or web address], password [facility] 03/02/2012, 5:26 PM  LOS: 6 days

## 2012-03-03 LAB — BASIC METABOLIC PANEL
BUN: 3 mg/dL — ABNORMAL LOW (ref 6–23)
Calcium: 8.2 mg/dL — ABNORMAL LOW (ref 8.4–10.5)
GFR calc non Af Amer: >90 mL/min (ref >90)
Glucose, Bld: 111 mg/dL — ABNORMAL HIGH (ref 70–99)
Sodium: 137 mEq/L (ref 135–145)

## 2012-03-03 LAB — GLUCOSE, CAPILLARY
Glucose-Capillary: 159 mg/dL — ABNORMAL HIGH (ref 70–99)
Glucose-Capillary: 116 mg/dL — ABNORMAL HIGH (ref 70–99)
Glucose-Capillary: 135 mg/dL — ABNORMAL HIGH (ref 70–99)

## 2012-03-03 LAB — CREATININE, SERUM
GFR calc Af Amer: >90 mL/min (ref >90)
GFR calc non Af Amer: >90 mL/min (ref >90)

## 2012-03-03 MED ORDER — OXYCODONE HCL 10 MG PO TB12
10.0000 mg | ORAL_TABLET | Freq: Two times a day (BID) | ORAL | Status: DC
  Administered 2012-03-03 – 2012-03-08 (×11): 10 mg via ORAL
  Filled 2012-03-03 (×11): qty 1

## 2012-03-03 MED ORDER — HYDRALAZINE HCL 20 MG/ML IJ SOLN
10.0000 mg | Freq: Four times a day (QID) | INTRAMUSCULAR | Status: DC
  Administered 2012-03-03 – 2012-03-14 (×44): 10 mg via INTRAVENOUS
  Filled 2012-03-03 (×3): qty 0.5
  Filled 2012-03-03: qty 1
  Filled 2012-03-03 (×13): qty 0.5
  Filled 2012-03-03: qty 1
  Filled 2012-03-03 (×4): qty 0.5
  Filled 2012-03-03: qty 1
  Filled 2012-03-03 (×5): qty 0.5
  Filled 2012-03-03: qty 1
  Filled 2012-03-03 (×5): qty 0.5
  Filled 2012-03-03: qty 1
  Filled 2012-03-03 (×5): qty 0.5
  Filled 2012-03-03: qty 1
  Filled 2012-03-03 (×9): qty 0.5

## 2012-03-03 NOTE — Progress Notes (Signed)
Patient ID: [name], female   DOB: 12/06/1970, 41 y.o.   MRN: [id number] [facility] Progress Note  [name] 41 y.o. 02/14/1971   Subjective: Reports nausea and abdominal pain with eating jello yesterday and today without any vomiting. Resting comfortably watching TV upon my entering room. Nurse present during my examination.  Objective: Vital signs: Filed Vitals:   03/03/12 0627  BP: 136/85  Pulse: 87  Temp: 98.5 F (36.9 C)  Resp: 18    Physical Exam: Gen: alert, no acute distress  Abd: diffusely tender with guarding to light palpation, nondistended, positive bowel sounds  Lab Results: none   Assessment/Plan: 41yo with chronic abdominal pain in the setting of chronic narcotic pain meds that is likely due to gastroparesis from her meds and/or narcotic bowel syndrome. Will continue IV erythromycin and see if she derives any benefit from that. Will plan for EGD with propofol sedation next week if no improvement. Long-term nutrition by oral route likely to be difficult with her high requirement for narcotics and delayed gastric emptying. Will follow this weekend and see if any improvement in oral intake and if not then will plan for EGD with propofol on Tues/Wed if possible.     [place] C. 03/03/2012, 9:25 AM

## 2012-03-03 NOTE — Progress Notes (Signed)
[facility] PROGRESS NOTE  [name] [id number] DOB: 21-Nov-1970 DOA: 02/25/2012 PCP: Martha Merino, MD  Assessment/Plan:  Acute ileus / abdominal pain  -Likely secondary gastroparesis and use of chronic opioids.  -Continue symptomatic treatment/supportive care with Phenergan and Reglan. Patient has not improved and not able to tolerate diet. Started on IV erythromycin on 8/2. She has required feeding tube in past  -need to be cautious with use of narcotics, but pt still with c/o requiring increased meds for pain control  -CT of abd and pelvis with no evidence of acute obstrction  -Appreciate GI recs. -patient taken for endoscopy but was unable to pass gastroscope as she was agitated  -Patient refused for gastric emptying study. Did not have full UGI series.  GI plan on repeat EGD  next week under sedation.   gastroparesis.  Secondary to diabetes. Continue Reglan as above. Added erythromycin for 3 days  -GI to follow with further recommendations   hypokalemia  Replenished kcl   dehydration.  Cont IV fluids   chronic pancreatitis/chronic pain  -lipase wnl, CT abd as above, pain management   diabetes mellitus.  -Sliding scale insulin  DVT prophylaxis. Sq Lovenox  Code Status: full  Family Communication: none  Disposition Plan: home once stable   Brief narrative:  40 year old African American female with a history of diabetes with gastroparesis, chronic pancreatitis, hypertension, gastroesophageal reflux disease, coronary artery disease, COPD, neuropathy admitted with diffuse abdominal pain due to gastroparesis vs narcotic induced pain  Consultants:  [facility] ( GI) Procedures:  For EGD under anesthesia next week Antibiotics:  None    HPI/Subjective:  Patient still has abdominal pain ongoing and not tolerating diet well.  Objective: Filed Vitals:   03/02/12 1517 03/02/12 2047 03/03/12 0627 03/03/12 0840  BP: 150/90 152/89 136/85   Pulse: 82 83 87   Temp:  99.3 F (37.4 C) 98.7 F (37.1 C) 98.5 F (36.9 C)   TempSrc: Oral Oral Oral   Resp: 18 18 18    Height:      Weight:      SpO2: 100% 99% 99% 98%    Intake/Output Summary (Last 24 hours) at 03/03/12 1437 Last data filed at 03/03/12 0700  Gross per 24 hour  Intake   1960 ml  Output   1900 ml  Net     60 ml    Exam:  General: NAD  HEENT: no pallor, moist oral mucosa,  Cardiovascular: NS1 &S2, no murmurs  Respiratory: clear b/l , no added sounds  Abdomen: soft, tender to palpation,, BS+  Ext: warm, no edema  CNS: AAOX3, non focal   Data Reviewed: Basic Metabolic Panel:  Lab [id number] 03/03/12 0540 02/29/12 0413 02/28/12 0400 02/27/12 0500 02/26/12 0504  NA 137 -- 135 136 136 135  K 3.6 -- 3.3* 3.2* 3.0* 3.8  CL 104 -- 102 102 100 98  CO2 28 -- 26 27 26 27   GLUCOSE 111* -- 170* 129* 79 137*  BUN 3* -- 3* 4* 9 13  CREATININE 0.47* 0.50 0.46* 0.52 0.55 --  CALCIUM 8.2* -- 8.4 8.2* 8.2* 7.9*  MG -- -- 1.6 1.3* -- --  PHOS -- -- -- -- -- --   Liver Function Tests:  Lab 02/25/12 1751  AST 23  ALT 9  ALKPHOS 102  BILITOT 0.2*  PROT 7.3  ALBUMIN 3.1*    Lab 02/25/12 1751  LIPASE 13  AMYLASE --   No results found for this basename: AMMONIA:5 in the last 168  hours CBC:  Lab 02/27/12 0500 02/25/12 1751  WBC 7.4 11.3*  NEUTROABS -- 8.4*  HGB 11.3* 13.6  HCT 33.0* 38.4  MCV 86.8 84.6  PLT 200 298   Cardiac Enzymes: No results found for this basename: CKTOTAL:5,CKMB:5,CKMBINDEX:5,TROPONINI:5 in the last 168 hours BNP (last 3 results) No results found for this basename: PROBNP:3 in the last 8760 hours CBG:  Lab 03/03/12 1211 03/03/12 0733 03/02/12 2045 03/02/12 1656 03/02/12 1218  GLUCAP 116* 159* 201* 99 148*    No results found for this or any previous visit (from the past 240 hour(s)).   Studies: Ct Abdomen Pelvis Wo Contrast  02/26/2012  *RADIOLOGY REPORT*  Clinical Data: 41 year old female with abdominal and pelvic pain. History of previous  bowel obstructions and bowel resections.  CT ABDOMEN AND PELVIS WITHOUT CONTRAST  Technique:  Multidetector CT imaging of the abdomen and pelvis was performed following the standard protocol without intravenous contrast. Intravenous contrast was not administered due to inability to gain IV access.  Comparison: 06/10/2011  Findings: The liver, spleen, kidneys, adrenal glands and pancreas are unremarkable. Please note that parenchymal abnormalities may be missed as intravenous contrast was not administered. The patient is status post cholecystectomy and hysterectomy.  Circumferentially thickened bladder with gas again noted - question chronic cystitis versus bladder hypertrophy with recent catheterization.  No free fluid, enlarged lymph nodes, biliary dilation or abdominal aortic aneurysm identified.  No evidence of previous bowel surgery noted. The visualized bowel is unremarkable.  There is no evidence of bowel obstruction or pneumoperitoneum.  Foci of gas within the right gluteus maximus and anterior right pelvic subcutaneous tissues are likely related to injection sites but correlate clinically.  No acute or suspicious bony abnormalities are identified. Prior lower lumbar fusion again noted.  IMPRESSION: No evidence of acute abnormality.  Chronically thickened bladder containing gas again noted.  Question chronic cystitis versus bladder hypertrophy with recent catheterization.  Original Report Authenticated By: Lura Em, M.D.   Dg Abd 1 View  02/25/2012  *RADIOLOGY REPORT*  Clinical Data: Abdominal pain, vomiting  ABDOMEN - 1 VIEW  Comparison: 02/09/2012  Findings: Post cholecystectomy surgical clips are noted.  Stable postsurgical changes lumbar spine.  Mild gaseous distended bowel loops mid abdomen suspicious for ileus.  IMPRESSION: Gaseous distended bowel loops mid abdomen suspicious for ileus. Stable postsurgical changes.  Original Report Authenticated By: Lahoma Crocker, M.D.   Dg Esophagus  03/01/2012   *RADIOLOGY REPORT*  Clinical Data: Nausea/vomiting, abdominal pain  ESOPHOGRAM/BARIUM SWALLOW  Technique:  Single contrast examination was performed using thick barium.  Fluoroscopy time:  41 seconds.  Comparison:  None.  Note:  The patient was unable to tolerate effervescent crystals and more than a single swallow of thick barium.  As such, following initial evaluation of the esophagus, the study was aborted.  Findings:  No laryngeal penetration or tracheal bronchial aspiration.  Esophageal peristalsis is relatively preserved.  Although patent, the distal esophagus/lower esophageal sphincter remains narrowed throughout the majority of the evaluation. Therefore, a lower esophageal stricture cannot be excluded.  The stomach could not be satisfactorily evaluated.  IMPRESSION: The study was prematurely aborted as the patient could not tolerate more than a single swallow of thick barium.  Normal esophageal peristalsis.  Persistent narrowing of the distal esophagus/lower esophageal sphincter, although patent.  Lower esophageal stricture cannot be excluded.  Original Report Authenticated By: Julian Hy, M.D.   Ir Fluoro Guide Cv Line Right  02/10/2012  *RADIOLOGY REPORT*  Clinical Data: Nausea  and  vomiting, history of chronic pancreatitis; request is made for central venous access  for fluids and medications.  PICC LINE PLACEMENT WITH ULTRASOUND AND FLUOROSCOPIC  GUIDANCE  Fluoroscopy Time: 1.3 minutes.  The right  arm was prepped with chlorhexidine, draped in the usual sterile fashion using maximum barrier technique (cap and mask, sterile gown, sterile gloves, large sterile sheet, hand hygiene and cutaneous antisepsis) and infiltrated locally with 1% Lidocaine.  Ultrasound demonstrated patency of the right brachial vein, and this was documented with an image.  Under real-time ultrasound guidance, this vein was accessed with a 21 gauge micropuncture needle and image documentation was performed.  The needle was  exchanged over a guidewire for a peel-away sheath through which a five [place] double lumen PICC trimmed to 38 cm was advanced, positioned with its tip at the lower SVC/right atrial junction. Fluoroscopy during the procedure and fluoro spot radiograph confirms appropriate catheter position.  The catheter was flushed, secured to the skin with Prolene sutures, and covered with a sterile dressing.  Complications:  None  IMPRESSION: Successful right arm PICC line placement with ultrasound and fluoroscopic guidance.  The catheter is ready for use.  Read by: Rowe Robert, P.A.-C  Original Report Authenticated By: Azzie Roup, M.D.   Ir US Guide Vasc Access Right  02/24/2012  *RADIOLOGY REPORT*  Clinical Data: Nausea  and vomiting, history of chronic pancreatitis; request is made for central venous access  for fluids and medications.  PICC LINE PLACEMENT WITH ULTRASOUND AND FLUOROSCOPIC  GUIDANCE  Fluoroscopy Time: 1.3 minutes.  The right  arm was prepped with chlorhexidine, draped in the usual sterile fashion using maximum barrier technique (cap and mask, sterile gown, sterile gloves, large sterile sheet, hand hygiene and cutaneous antisepsis) and infiltrated locally with 1% Lidocaine.  Ultrasound demonstrated patency of the right brachial vein, and this was documented with an image.  Under real-time ultrasound guidance, this vein was accessed with a 21 gauge micropuncture needle and image documentation was performed.  The needle was exchanged over a guidewire for a peel-away sheath through which a five [place] double lumen PICC trimmed to 38 cm was advanced, positioned with its tip at the lower SVC/right atrial junction. Fluoroscopy during the procedure and fluoro spot radiograph confirms appropriate catheter position.  The catheter was flushed, secured to the skin with Prolene sutures, and covered with a sterile dressing.  Complications:  None  IMPRESSION: Successful right arm PICC line placement with ultrasound  and fluoroscopic guidance.  The catheter is ready for use.  Read by: Rowe Robert, P.A.-C  Original Report Authenticated By: Azzie Roup, M.D.   Dg Abd Acute W/chest  02/09/2012  *RADIOLOGY REPORT*  Clinical Data: Vomiting, abdominal pain  ACUTE ABDOMEN SERIES (ABDOMEN 2 VIEW & CHEST 1 VIEW)  Comparison: 01/12/2012  Findings: Nasogastric tube extends into stomach though the proximal side port is at the distal esophagus. Right arm PICC line, tip projecting over cavoatrial junction. Normal heart size, mediastinal contours, and pulmonary vascularity. Lungs clear. Numerous cardiac monitoring leads project over chest. Prior L4-L5 fusion and L4 laminectomy.  Surgical clips right upper quadrant question cholecystectomy. Metallic foreign body question BB projects over left pelvis. Marked osseous demineralization. Nonobstructive bowel gas pattern. No definite bowel dilatation, bowel wall thickening or free intraperitoneal air. Gas collection identified in pelvis, uncertain if represents bowel or air filled bladder. Anastomotic staple lines from bowel surgery in the left mid abdomen. Single tiny questionable nonobstructing right renal calculus.  IMPRESSION: Recommend advancing nasogastric  tube 8 cm to place the proximal side port in the proximal stomach. Nonobstructive bowel gas pattern though a paucity of bowel gas is noted. Rounded gas collection in the pelvis, question bowel versus air filled urinary bladder; has the patient been recently catheterized?  Original Report Authenticated By: Burnetta Sabin, M.D.    Scheduled Meds:   . enoxaparin (LOVENOX) injection  40 mg Subcutaneous Q24H  . erythromycin  250 mg Intravenous Q6H  . fentaNYL  25 mcg Transdermal Q72H  . Fluticasone-Salmeterol  1 puff Inhalation BID  . hydrALAZINE  10 mg Intravenous Q6H  . insulin aspart  0-9 Units Subcutaneous TID WC  . metoCLOPramide (REGLAN) injection  10 mg Intravenous Q6H  . oxyCODONE  10 mg Oral Q12H  . pantoprazole  (PROTONIX) IV  40 mg Intravenous BID  . potassium chloride  40 mEq Oral Once  . scopolamine  1 patch Transdermal Q72H  . sodium chloride  3 mL Intravenous Q12H  . DISCONTD: lisinopril  20 mg Oral Daily  . DISCONTD: metoprolol succinate  50 mg Oral Daily   Continuous Infusions:   . dextrose 5 % and 0.9% NaCl 1,000 mL with potassium chloride 20 mEq infusion 100 mL/hr at 03/03/12 0700      Time spent 30 minutes    Orian Figueira, [facility] Pager [phone]. If 8PM-8AM, please contact night-coverage at [email or web address], password [facility] 03/03/2012, 2:37 PM  LOS: 7 days

## 2012-03-04 LAB — GLUCOSE, CAPILLARY
Glucose-Capillary: 175 mg/dL — ABNORMAL HIGH (ref 70–99)
Glucose-Capillary: 183 mg/dL — ABNORMAL HIGH (ref 70–99)
Glucose-Capillary: 142 mg/dL — ABNORMAL HIGH (ref 70–99)
Glucose-Capillary: 97 mg/dL (ref 70–99)

## 2012-03-04 MED ORDER — MORPHINE SULFATE 2 MG/ML IJ SOLN
INTRAMUSCULAR | Status: AC
  Filled 2012-03-04: qty 1

## 2012-03-04 MED ORDER — MORPHINE SULFATE 2 MG/ML IJ SOLN
1.0000 mg | Freq: Once | INTRAMUSCULAR | Status: AC
  Administered 2012-03-04: 1 mg via INTRAVENOUS

## 2012-03-04 MED ORDER — KETOROLAC TROMETHAMINE 15 MG/ML IJ SOLN: 15.0000 mg | Freq: Once | INTRAMUSCULAR | Status: DC

## 2012-03-04 NOTE — Progress Notes (Signed)
Patient ID: [name], female   DOB: 23-Mar-1971, 41 y.o.   MRN: [id number] [facility] Progress Note  [name] 41 y.o. 1970-11-02   Subjective: Continued abdominal pain and reports vomiting with sipping liquids and eating jello yesterday. Abdominal pain this morning.  Objective: Vital signs: Filed Vitals:   03/04/12 0937  BP: 162/100  Pulse: 100  Temp: 99.2  Resp: 18    Physical Exam: Gen: alert, no acute distress  Abd: diffuse tenderness with severe guarding to light palpation in epigastric area, small mobile subcutaneous knot palpated in epigastric area (separate from tender areas), soft, nondistended, +BS  Lab Results:  Basename 03/03/12 1036 03/03/12 0540  NA 137 --  K 3.6 --  CL 104 --  CO2 28 --  GLUCOSE 111* --  BUN 3* --  CREATININE 0.47* 0.50  CALCIUM 8.2* --  MG -- --  PHOS -- --   No results found for this basename: AST:2,ALT:2,ALKPHOS:2,BILITOT:2,PROT:2,ALBUMIN:2 in the last 72 hours No results found for this basename: WBC:2,NEUTROABS:2,HGB:2,HCT:2,MCV:2,PLT:2 in the last 72 hours    Assessment/Plan: 41yo with chronic abdominal pain in the setting of chronic narcotic pain meds that is likely due to gastroparesis from her meds and/or narcotic bowel syndrome. Will continue IV erythromycin another day and see if she derives any benefit from that. Will likely need an EGD with propofol sedation this week since no significant improvement. Long-term nutrition by oral route likely to be difficult with her high requirement for narcotics and likely delayed gastric emptying. Dr. Oletta Lamas to see tomorrow and decide on timing of EGD. Keep on clears.   Wye C. 03/04/2012, 1:30 PM

## 2012-03-04 NOTE — Progress Notes (Signed)
Pt c/o severe pain to abdomen after eating less than 120 ml of red jello. Noted restless in bed with pillow to abdomen and crying. MD notified. Gave order for toradol. Pt allergic to NSaids; pharmacy contacted regarding drug allergy and new order. MD contacted about same allergy. Gave new order for morphine. Pt medicated. Showed relief 5 minutes after drug administration.

## 2012-03-04 NOTE — Progress Notes (Signed)
CALLED TO ASSESS PT'S LEFT FEM CL FOR LEAKING, BLOODY FLUID NOTED UNDERNEATH DRESSING, WHEN FLUSHED NO LEAK WAS NOTED, PORTS FLUSHED WITH NS, INSERTION SITE NOTED TO BE PINK, EXPLAINED TO PT THAT LINE SHOULD BE REMOVED, STAFF RN MADE AWARE OF SITE CONDITION AND ASKED MD BE MADE AWARE AND OBTAIN AN ORDER TO DC LINE, ALSO ASKED FOR A MIDLINE ORDER

## 2012-03-04 NOTE — Progress Notes (Signed)
[facility] PROGRESS NOTE  [name] [id number] DOB: 17-Aug-1970 DOA: 02/25/2012 PCP: [name], MD   Brief narrative:  41 year old African American female with a history of diabetes with gastroparesis, chronic pancreatitis, hypertension, gastroesophageal reflux disease, coronary artery disease, COPD, neuropathy admitted with diffuse abdominal pain due to gastroparesis vs narcotic induced pain   Assessment/Plan:  Acute ileus / abdominal pain  -Likely secondary gastroparesis and use of chronic opioids.  -Continue symptomatic treatment/supportive care with Phenergan and Reglan. Patient has not improved and not able to tolerate diet. Started on IV erythromycin on 8/2. She has required feeding tube in past  -need to be cautious with use of narcotics, but pt still with c/o requiring increased meds for pain control  -CT of abd and pelvis with no evidence of acute obstrction  -Appreciate GI recs. -patient taken for endoscopy but was unable to pass gastroscope as she was agitated  -Patient refused for gastric emptying study. Did not have full UGI series.  - will be reassessed by GI tomorrow and decide on the time for EGD  gastroparesis.  Secondary to diabetes. Continue Reglan as above. Added erythromycin  -GI to follow with further recommendations  -Clear liquids  hypokalemia  Replenished kcl   dehydration.  Cont IV fluids   chronic pancreatitis/chronic pain  -lipase wnl, CT abd as above, pain management   diabetes mellitus.  -Sliding scale insulin   DVT prophylaxis. Sq Lovenox  Code Status: full  Family Communication: none  Disposition Plan: home once stable    Consultants:  [facility]  Procedures:  For EGD under anesthesia next week Antibiotics:  None    HPI/Subjective: Patient was comfortably lying in bed using her tablet when i entered the room. Says she is a little better today.   Objective: Filed Vitals:   03/04/12 0558 03/04/12 0848 03/04/12 0937  03/04/12 1507  BP: 103/62  162/100 135/86  Pulse: 100   98  Temp: 99.2 F (37.3 C)   97.9 F (36.6 C)  TempSrc: Oral   Oral  Resp: 18   19  Height:      Weight:      SpO2: 98% 97%  97%    Intake/Output Summary (Last 24 hours) at 03/04/12 1735 Last data filed at 03/04/12 1734  Gross per 24 hour  Intake 2756.67 ml  Output   2800 ml  Net -43.33 ml    Exam: General: NAD  HEENT: no pallor, moist oral mucosa,  Cardiovascular: NS1 &S2, no murmurs  Respiratory: clear b/l , no added sounds  Abdomen: soft, tender to palpation,, BS+  Ext: warm, no edema  CNS: AAOX3, non focal   Data Reviewed: Basic Metabolic Panel:  Lab [id number] 03/03/12 0540 02/29/12 0413 02/28/12 0400 02/27/12 0500  NA 137 -- 135 136 136  K 3.6 -- 3.3* 3.2* 3.0*  CL 104 -- 102 102 100  CO2 28 -- 26 27 26   GLUCOSE 111* -- 170* 129* 79  BUN 3* -- 3* 4* 9  CREATININE 0.47* 0.50 0.46* 0.52 0.55  CALCIUM 8.2* -- 8.4 8.2* 8.2*  MG -- -- 1.6 1.3* --  PHOS -- -- -- -- --   Liver Function Tests: No results found for this basename: AST:5,ALT:5,ALKPHOS:5,BILITOT:5,PROT:5,ALBUMIN:5 in the last 168 hours No results found for this basename: LIPASE:5,AMYLASE:5 in the last 168 hours No results found for this basename: AMMONIA:5 in the last 168 hours CBC:  Lab 02/27/12 0500  WBC 7.4  NEUTROABS --  HGB 11.3*  HCT 33.0*  MCV 86.8  PLT 200   Cardiac Enzymes: No results found for this basename: CKTOTAL:5,CKMB:5,CKMBINDEX:5,TROPONINI:5 in the last 168 hours BNP (last 3 results) No results found for this basename: PROBNP:3 in the last 8760 hours CBG:  Lab 03/04/12 1638 03/04/12 1141 03/04/12 0758 03/03/12 1636 03/03/12 1211  GLUCAP 142* 183* 175* 135* 116*    No results found for this or any previous visit (from the past 240 hour(s)).   Studies: Ct Abdomen Pelvis Wo Contrast  02/26/2012  *RADIOLOGY REPORT*  Clinical Data: 41 year old female with abdominal and pelvic pain. History of previous bowel  obstructions and bowel resections.  CT ABDOMEN AND PELVIS WITHOUT CONTRAST  Technique:  Multidetector CT imaging of the abdomen and pelvis was performed following the standard protocol without intravenous contrast. Intravenous contrast was not administered due to inability to gain IV access.  Comparison: 06/10/2011  Findings: The liver, spleen, kidneys, adrenal glands and pancreas are unremarkable. Please note that parenchymal abnormalities may be missed as intravenous contrast was not administered. The patient is status post cholecystectomy and hysterectomy.  Circumferentially thickened bladder with gas again noted - question chronic cystitis versus bladder hypertrophy with recent catheterization.  No free fluid, enlarged lymph nodes, biliary dilation or abdominal aortic aneurysm identified.  No evidence of previous bowel surgery noted. The visualized bowel is unremarkable.  There is no evidence of bowel obstruction or pneumoperitoneum.  Foci of gas within the right gluteus maximus and anterior right pelvic subcutaneous tissues are likely related to injection sites but correlate clinically.  No acute or suspicious bony abnormalities are identified. Prior lower lumbar fusion again noted.  IMPRESSION: No evidence of acute abnormality.  Chronically thickened bladder containing gas again noted.  Question chronic cystitis versus bladder hypertrophy with recent catheterization.  Original Report Authenticated By: Lura Em, M.D.   Dg Abd 1 View  02/25/2012  *RADIOLOGY REPORT*  Clinical Data: Abdominal pain, vomiting  ABDOMEN - 1 VIEW  Comparison: 02/09/2012  Findings: Post cholecystectomy surgical clips are noted.  Stable postsurgical changes lumbar spine.  Mild gaseous distended bowel loops mid abdomen suspicious for ileus.  IMPRESSION: Gaseous distended bowel loops mid abdomen suspicious for ileus. Stable postsurgical changes.  Original Report Authenticated By: Lahoma Crocker, M.D.   Dg Esophagus  03/01/2012   *RADIOLOGY REPORT*  Clinical Data: Nausea/vomiting, abdominal pain  ESOPHOGRAM/BARIUM SWALLOW  Technique:  Single contrast examination was performed using thick barium.  Fluoroscopy time:  41 seconds.  Comparison:  None.  Note:  The patient was unable to tolerate effervescent crystals and more than a single swallow of thick barium.  As such, following initial evaluation of the esophagus, the study was aborted.  Findings:  No laryngeal penetration or tracheal bronchial aspiration.  Esophageal peristalsis is relatively preserved.  Although patent, the distal esophagus/lower esophageal sphincter remains narrowed throughout the majority of the evaluation. Therefore, a lower esophageal stricture cannot be excluded.  The stomach could not be satisfactorily evaluated.  IMPRESSION: The study was prematurely aborted as the patient could not tolerate more than a single swallow of thick barium.  Normal esophageal peristalsis.  Persistent narrowing of the distal esophagus/lower esophageal sphincter, although patent.  Lower esophageal stricture cannot be excluded.  Original Report Authenticated By: Julian Hy, M.D.   Ir Fluoro Guide Cv Line Right  02/10/2012  *RADIOLOGY REPORT*  Clinical Data: Nausea  and vomiting, history of chronic pancreatitis; request is made for central venous access  for fluids and medications.  PICC LINE PLACEMENT WITH ULTRASOUND AND FLUOROSCOPIC  GUIDANCE  Fluoroscopy Time: 1.3 minutes.  The right  arm was prepped with chlorhexidine, draped in the usual sterile fashion using maximum barrier technique (cap and mask, sterile gown, sterile gloves, large sterile sheet, hand hygiene and cutaneous antisepsis) and infiltrated locally with 1% Lidocaine.  Ultrasound demonstrated patency of the right brachial vein, and this was documented with an image.  Under real-time ultrasound guidance, this vein was accessed with a 21 gauge micropuncture needle and image documentation was performed.  The needle was  exchanged over a guidewire for a peel-away sheath through which a five [place] double lumen PICC trimmed to 38 cm was advanced, positioned with its tip at the lower SVC/right atrial junction. Fluoroscopy during the procedure and fluoro spot radiograph confirms appropriate catheter position.  The catheter was flushed, secured to the skin with Prolene sutures, and covered with a sterile dressing.  Complications:  None  IMPRESSION: Successful right arm PICC line placement with ultrasound and fluoroscopic guidance.  The catheter is ready for use.  Read by: Rowe Robert, P.A.-C  Original Report Authenticated By: Azzie Roup, M.D.   Ir US Guide Vasc Access Right  02/24/2012  *RADIOLOGY REPORT*  Clinical Data: Nausea  and vomiting, history of chronic pancreatitis; request is made for central venous access  for fluids and medications.  PICC LINE PLACEMENT WITH ULTRASOUND AND FLUOROSCOPIC  GUIDANCE  Fluoroscopy Time: 1.3 minutes.  The right  arm was prepped with chlorhexidine, draped in the usual sterile fashion using maximum barrier technique (cap and mask, sterile gown, sterile gloves, large sterile sheet, hand hygiene and cutaneous antisepsis) and infiltrated locally with 1% Lidocaine.  Ultrasound demonstrated patency of the right brachial vein, and this was documented with an image.  Under real-time ultrasound guidance, this vein was accessed with a 21 gauge micropuncture needle and image documentation was performed.  The needle was exchanged over a guidewire for a peel-away sheath through which a five [place] double lumen PICC trimmed to 38 cm was advanced, positioned with its tip at the lower SVC/right atrial junction. Fluoroscopy during the procedure and fluoro spot radiograph confirms appropriate catheter position.  The catheter was flushed, secured to the skin with Prolene sutures, and covered with a sterile dressing.  Complications:  None  IMPRESSION: Successful right arm PICC line placement with ultrasound  and fluoroscopic guidance.  The catheter is ready for use.  Read by: Rowe Robert, P.A.-C  Original Report Authenticated By: Azzie Roup, M.D.   Dg Abd Acute W/chest  02/09/2012  *RADIOLOGY REPORT*  Clinical Data: Vomiting, abdominal pain  ACUTE ABDOMEN SERIES (ABDOMEN 2 VIEW & CHEST 1 VIEW)  Comparison: 01/12/2012  Findings: Nasogastric tube extends into stomach though the proximal side port is at the distal esophagus. Right arm PICC line, tip projecting over cavoatrial junction. Normal heart size, mediastinal contours, and pulmonary vascularity. Lungs clear. Numerous cardiac monitoring leads project over chest. Prior L4-L5 fusion and L4 laminectomy.  Surgical clips right upper quadrant question cholecystectomy. Metallic foreign body question BB projects over left pelvis. Marked osseous demineralization. Nonobstructive bowel gas pattern. No definite bowel dilatation, bowel wall thickening or free intraperitoneal air. Gas collection identified in pelvis, uncertain if represents bowel or air filled bladder. Anastomotic staple lines from bowel surgery in the left mid abdomen. Single tiny questionable nonobstructing right renal calculus.  IMPRESSION: Recommend advancing nasogastric tube 8 cm to place the proximal side port in the proximal stomach. Nonobstructive bowel gas pattern though a paucity of bowel gas is noted. Rounded gas collection  in the pelvis, question bowel versus air filled urinary bladder; has the patient been recently catheterized?  Original Report Authenticated By: Burnetta Sabin, M.D.    Scheduled Meds:   . enoxaparin (LOVENOX) injection  40 mg Subcutaneous Q24H  . erythromycin  250 mg Intravenous Q6H  . fentaNYL  25 mcg Transdermal Q72H  . Fluticasone-Salmeterol  1 puff Inhalation BID  . hydrALAZINE  10 mg Intravenous Q6H  . insulin aspart  0-9 Units Subcutaneous TID WC  . metoCLOPramide (REGLAN) injection  10 mg Intravenous Q6H  . oxyCODONE  10 mg Oral Q12H  . pantoprazole  (PROTONIX) IV  40 mg Intravenous BID  . potassium chloride  40 mEq Oral Once  . scopolamine  1 patch Transdermal Q72H  . sodium chloride  3 mL Intravenous Q12H   Continuous Infusions:   . dextrose 5 % and 0.9% NaCl 1,000 mL with potassium chloride 20 mEq infusion 100 mL/hr at 03/04/12 1607     Time spent: 30 minutes    Camela Wich, [place]  [facility] Pager [phone]. If 8PM-8AM, please contact night-coverage at [email or web address], password [facility] 03/04/2012, 5:35 PM  LOS: 8 days

## 2012-03-05 LAB — GLUCOSE, CAPILLARY
Glucose-Capillary: 187 mg/dL — ABNORMAL HIGH (ref 70–99)
Glucose-Capillary: 105 mg/dL — ABNORMAL HIGH (ref 70–99)
Glucose-Capillary: 124 mg/dL — ABNORMAL HIGH (ref 70–99)
Glucose-Capillary: 238 mg/dL — ABNORMAL HIGH (ref 70–99)

## 2012-03-05 NOTE — Progress Notes (Signed)
[facility] PROGRESS NOTE Subjective Patient continues to complain of pain, and more pain medications, and refused to eat much. Apparently, erythromycin did not help. She gives me the additional history that she had a feeding tube in her jejunum about 6 years ago and after this came out or was removed she has been living on Gatorade, ice cream etc. and has been unable to keep anything else. She is willing to have another feeding tube.  Objective: Vital signs in last 24 hours: Temp:  [97.9 F (36.6 C)-99.6 F (37.6 C)] 99.6 F (37.6 C) (08/05 0611) Pulse Rate:  [98-115] 115  (08/05 0325) Resp:  [18-20] 20  (08/05 0611) BP: (135-168)/(86-102) 168/102 mmHg (08/05 0611) SpO2:  [97 %-100 %] 97 % (08/05 0810) FiO2 (%):  [21 %] 21 % (08/05 0810) Last BM Date: 03/02/12  Intake/Output from previous day: 08/04 0701 - 08/05 0700 In: 3700 [I.V.:3700] Out: 800 [Urine:800] Intake/Output this shift:    PE: Gen.-patient appears to be in somewhat pain Abdomen-positive bowel sounds mild upper tenderness.  Lab Results: No results found for this basename: WBC:5,HGB:5,HCT:5,PLT:5 in the last 72 hours BMET  Basename 03/03/12 1036 03/03/12 0540  NA 137 --  K 3.6 --  CL 104 --  CO2 28 --  CREATININE 0.47* 0.50   LFT No results found for this basename: PROT:3ALBUMIN:3,AST:3,ALT:3,ALKPHOS:3,BILITOT:3,BILIDIR:3,IBILI:3 in the last 72 hours PT/INR No results found for this basename: LABPROT:3,INR:3 in the last 72 hours PANCREAS No results found for this basename: LIPASE:3 in the last 72 hours       Studies/Results: No results found.  Medications: I have reviewed the patient's current medications.  Assessment/Plan: 1. Abdominal pain-gastroparesis-chronic pancreatitis. This clearly has been a chronic problem for her. She reports extensive testing and therapy in [place] as well as and [place] including placement of a feeding gastrostomy or jejunostomy tube on 2 previous  occasions. I think she does need an EGD just to make certain nothing else is going on and she is willing to do this with propofol. This is scheduled in the morning at 7:30. If this is negative, I think we should proceed with radiology performing a gastrostomy tube in threading a small bowel feeding tube. She could then be started on tube feeding and liquid pain medication with discharge home and followup in a pain clinic.   [name],[name] 03/05/2012, 9:07 AM

## 2012-03-05 NOTE — Progress Notes (Signed)
[facility] PROGRESS NOTE  [name] [id number] DOB: Dec 24, 1970 DOA: 02/25/2012 PCP: [name], MD  Brief narrative:  41 year old African American female with a history of diabetes with gastroparesis, chronic pancreatitis, hypertension, gastroesophageal reflux disease, coronary artery disease, COPD, neuropathy admitted with diffuse abdominal pain due to gastroparesis vs narcotic induced pain   Assessment/Plan:  Acute ileus / abdominal pain  -Likely secondary gastroparesis and use of chronic opioids.  -Continue symptomatic treatment/supportive care with Phenergan and Reglan. Patient has not improved and not able to tolerate diet. Started on IV erythromycin on 8/2. She has required feeding tube in past  -need to be cautious with use of narcotics, but pt still with c/o requiring increased meds for pain control  -CT of abd and pelvis with no evidence of acute obstrction  -Appreciate GI recs. -patient taken for endoscopy but was unable to pass gastroscope as she was agitated  -Patient refused for gastric emptying study. Did not have full UGI series.  - scheduled for EGD under anesthesia tomorrow am and if negative will need  A feeding tube   gastroparesis.  Secondary to diabetes. Continue Reglan as above. Added erythromycin  -GI to follow with further recommendations  -Clear liquids . NPO after midnight for EGD in am  hypokalemia  Replenished kcl  dehydration.  Cont IV fluids  chronic pancreatitis/chronic pain  -lipase wnl, CT abd as above, pain management  diabetes mellitus.  -Sliding scale insulin  DVT prophylaxis. Sq Lovenox  Code Status: full  Family Communication: none  Disposition Plan: home once stable   Consultants:  [facility]  Procedures:  For EGD under anesthesia tomorrow am  Antibiotics:  None   Patient has a femoral line since 7/29 and needs to be removed. Has poor IV access and IV team unable to place a midline . Will plan for new IV access tomorrow  after EGD.   HPI/Subjective: Has abdominal pain off and on. Unable to tolerate diet  Objective: Filed Vitals:   03/05/12 0325 03/05/12 0611 03/05/12 0810 03/05/12 1516  BP: 163/89 168/102  129/77  Pulse: 115   101  Temp:  99.6 F (37.6 C)  99.5 F (37.5 C)  TempSrc:  Oral  Oral  Resp:  20  20  Height:      Weight:      SpO2:  100% 97% 99%    Intake/Output Summary (Last 24 hours) at 03/05/12 1840 Last data filed at 03/05/12 1500  Gross per 24 hour  Intake 1463.33 ml  Output    700 ml  Net 763.33 ml    Exam: General: NAD  HEENT: no pallor, moist oral mucosa,  Cardiovascular: NS1 &S2, no murmurs  Respiratory: clear b/l , no added sounds  Abdomen: soft, tender to palpation,, BS+  Ext: warm, no edema  CNS: AAOX3, non focal   Data Reviewed: Basic Metabolic Panel:  Lab [id number] 03/03/12 0540 02/29/12 0413 02/28/12 0400  NA 137 -- 135 136  K 3.6 -- 3.3* 3.2*  CL 104 -- 102 102  CO2 28 -- 26 27  GLUCOSE 111* -- 170* 129*  BUN 3* -- 3* 4*  CREATININE 0.47* 0.50 0.46* 0.52  CALCIUM 8.2* -- 8.4 8.2*  MG -- -- 1.6 1.3*  PHOS -- -- -- --   Liver Function Tests: No results found for this basename: AST:5,ALT:5,ALKPHOS:5,BILITOT:5,PROT:5,ALBUMIN:5 in the last 168 hours No results found for this basename: LIPASE:5,AMYLASE:5 in the last 168 hours No results found for this basename: AMMONIA:5 in the last  168 hours CBC: No results found for this basename: WBC:5,NEUTROABS:5,HGB:5,HCT:5,MCV:5,PLT:5 in the last 168 hours Cardiac Enzymes: No results found for this basename: CKTOTAL:5,CKMB:5,CKMBINDEX:5,TROPONINI:5 in the last 168 hours BNP (last 3 results) No results found for this basename: PROBNP:3 in the last 8760 hours CBG:  Lab 03/05/12 1758 03/05/12 1229 03/05/12 0750 03/04/12 2215 03/04/12 1638  GLUCAP 124* 105* 187* 97 142*    No results found for this or any previous visit (from the past 240 hour(s)).   Studies: Ct Abdomen Pelvis Wo Contrast  02/26/2012   *RADIOLOGY REPORT*  Clinical Data: 41 year old female with abdominal and pelvic pain. History of previous bowel obstructions and bowel resections.  CT ABDOMEN AND PELVIS WITHOUT CONTRAST  Technique:  Multidetector CT imaging of the abdomen and pelvis was performed following the standard protocol without intravenous contrast. Intravenous contrast was not administered due to inability to gain IV access.  Comparison: 06/10/2011  Findings: The liver, spleen, kidneys, adrenal glands and pancreas are unremarkable. Please note that parenchymal abnormalities may be missed as intravenous contrast was not administered. The patient is status post cholecystectomy and hysterectomy.  Circumferentially thickened bladder with gas again noted - question chronic cystitis versus bladder hypertrophy with recent catheterization.  No free fluid, enlarged lymph nodes, biliary dilation or abdominal aortic aneurysm identified.  No evidence of previous bowel surgery noted. The visualized bowel is unremarkable.  There is no evidence of bowel obstruction or pneumoperitoneum.  Foci of gas within the right gluteus maximus and anterior right pelvic subcutaneous tissues are likely related to injection sites but correlate clinically.  No acute or suspicious bony abnormalities are identified. Prior lower lumbar fusion again noted.  IMPRESSION: No evidence of acute abnormality.  Chronically thickened bladder containing gas again noted.  Question chronic cystitis versus bladder hypertrophy with recent catheterization.  Original Report Authenticated By: Lura Em, M.D.   Dg Abd 1 View  02/25/2012  *RADIOLOGY REPORT*  Clinical Data: Abdominal pain, vomiting  ABDOMEN - 1 VIEW  Comparison: 02/09/2012  Findings: Post cholecystectomy surgical clips are noted.  Stable postsurgical changes lumbar spine.  Mild gaseous distended bowel loops mid abdomen suspicious for ileus.  IMPRESSION: Gaseous distended bowel loops mid abdomen suspicious for ileus.  Stable postsurgical changes.  Original Report Authenticated By: Lahoma Crocker, M.D.   Dg Esophagus  03/01/2012  *RADIOLOGY REPORT*  Clinical Data: Nausea/vomiting, abdominal pain  ESOPHOGRAM/BARIUM SWALLOW  Technique:  Single contrast examination was performed using thick barium.  Fluoroscopy time:  41 seconds.  Comparison:  None.  Note:  The patient was unable to tolerate effervescent crystals and more than a single swallow of thick barium.  As such, following initial evaluation of the esophagus, the study was aborted.  Findings:  No laryngeal penetration or tracheal bronchial aspiration.  Esophageal peristalsis is relatively preserved.  Although patent, the distal esophagus/lower esophageal sphincter remains narrowed throughout the majority of the evaluation. Therefore, a lower esophageal stricture cannot be excluded.  The stomach could not be satisfactorily evaluated.  IMPRESSION: The study was prematurely aborted as the patient could not tolerate more than a single swallow of thick barium.  Normal esophageal peristalsis.  Persistent narrowing of the distal esophagus/lower esophageal sphincter, although patent.  Lower esophageal stricture cannot be excluded.  Original Report Authenticated By: Julian Hy, M.D.   Ir Fluoro Guide Cv Line Right  02/10/2012  *RADIOLOGY REPORT*  Clinical Data: Nausea  and vomiting, history of chronic pancreatitis; request is made for central venous access  for fluids and medications.  PICC  LINE PLACEMENT WITH ULTRASOUND AND FLUOROSCOPIC  GUIDANCE  Fluoroscopy Time: 1.3 minutes.  The right  arm was prepped with chlorhexidine, draped in the usual sterile fashion using maximum barrier technique (cap and mask, sterile gown, sterile gloves, large sterile sheet, hand hygiene and cutaneous antisepsis) and infiltrated locally with 1% Lidocaine.  Ultrasound demonstrated patency of the right brachial vein, and this was documented with an image.  Under real-time ultrasound guidance, this  vein was accessed with a 21 gauge micropuncture needle and image documentation was performed.  The needle was exchanged over a guidewire for a peel-away sheath through which a five [place] double lumen PICC trimmed to 38 cm was advanced, positioned with its tip at the lower SVC/right atrial junction. Fluoroscopy during the procedure and fluoro spot radiograph confirms appropriate catheter position.  The catheter was flushed, secured to the skin with Prolene sutures, and covered with a sterile dressing.  Complications:  None  IMPRESSION: Successful right arm PICC line placement with ultrasound and fluoroscopic guidance.  The catheter is ready for use.  Read by: Rowe Robert, P.A.-C  Original Report Authenticated By: Azzie Roup, M.D.   Ir US Guide Vasc Access Right  02/24/2012  *RADIOLOGY REPORT*  Clinical Data: Nausea  and vomiting, history of chronic pancreatitis; request is made for central venous access  for fluids and medications.  PICC LINE PLACEMENT WITH ULTRASOUND AND FLUOROSCOPIC  GUIDANCE  Fluoroscopy Time: 1.3 minutes.  The right  arm was prepped with chlorhexidine, draped in the usual sterile fashion using maximum barrier technique (cap and mask, sterile gown, sterile gloves, large sterile sheet, hand hygiene and cutaneous antisepsis) and infiltrated locally with 1% Lidocaine.  Ultrasound demonstrated patency of the right brachial vein, and this was documented with an image.  Under real-time ultrasound guidance, this vein was accessed with a 21 gauge micropuncture needle and image documentation was performed.  The needle was exchanged over a guidewire for a peel-away sheath through which a five [place] double lumen PICC trimmed to 38 cm was advanced, positioned with its tip at the lower SVC/right atrial junction. Fluoroscopy during the procedure and fluoro spot radiograph confirms appropriate catheter position.  The catheter was flushed, secured to the skin with Prolene sutures, and covered with a  sterile dressing.  Complications:  None  IMPRESSION: Successful right arm PICC line placement with ultrasound and fluoroscopic guidance.  The catheter is ready for use.  Read by: Rowe Robert, P.A.-C  Original Report Authenticated By: Azzie Roup, M.D.   Dg Abd Acute W/chest  02/09/2012  *RADIOLOGY REPORT*  Clinical Data: Vomiting, abdominal pain  ACUTE ABDOMEN SERIES (ABDOMEN 2 VIEW & CHEST 1 VIEW)  Comparison: 01/12/2012  Findings: Nasogastric tube extends into stomach though the proximal side port is at the distal esophagus. Right arm PICC line, tip projecting over cavoatrial junction. Normal heart size, mediastinal contours, and pulmonary vascularity. Lungs clear. Numerous cardiac monitoring leads project over chest. Prior L4-L5 fusion and L4 laminectomy.  Surgical clips right upper quadrant question cholecystectomy. Metallic foreign body question BB projects over left pelvis. Marked osseous demineralization. Nonobstructive bowel gas pattern. No definite bowel dilatation, bowel wall thickening or free intraperitoneal air. Gas collection identified in pelvis, uncertain if represents bowel or air filled bladder. Anastomotic staple lines from bowel surgery in the left mid abdomen. Single tiny questionable nonobstructing right renal calculus.  IMPRESSION: Recommend advancing nasogastric tube 8 cm to place the proximal side port in the proximal stomach. Nonobstructive bowel gas pattern though a  paucity of bowel gas is noted. Rounded gas collection in the pelvis, question bowel versus air filled urinary bladder; has the patient been recently catheterized?  Original Report Authenticated By: Burnetta Sabin, M.D.    Scheduled Meds:   . enoxaparin (LOVENOX) injection  40 mg Subcutaneous Q24H  . erythromycin  250 mg Intravenous Q6H  . fentaNYL  25 mcg Transdermal Q72H  . Fluticasone-Salmeterol  1 puff Inhalation BID  . hydrALAZINE  10 mg Intravenous Q6H  . insulin aspart  0-9 Units Subcutaneous TID WC    . metoCLOPramide (REGLAN) injection  10 mg Intravenous Q6H  .  morphine injection  1 mg Intravenous Once  . oxyCODONE  10 mg Oral Q12H  . pantoprazole (PROTONIX) IV  40 mg Intravenous BID  . potassium chloride  40 mEq Oral Once  . scopolamine  1 patch Transdermal Q72H  . sodium chloride  3 mL Intravenous Q12H  . DISCONTD: ketorolac  15 mg Intravenous Once   Continuous Infusions:   . dextrose 5 % and 0.9% NaCl 1,000 mL with potassium chloride 20 mEq infusion 100 mL/hr at 03/05/12 0316     Time spent: 30 minutes    Jeannie Mallinger  [facility] Pager [phone]. If 8PM-8AM, please contact night-coverage at [email or web address], password [facility] 03/05/2012, 6:40 PM  LOS: 9 days

## 2012-03-05 NOTE — Progress Notes (Signed)
Site checked - appeared to be "oozing" ,Tegaderm coming loose, site excoriated about size of quarter around insertion site, no drainage noted from site, no c/o pain when site palpated, no redness or edema noted. Dressing changed and 2x2 to site - no Biopatch to site at this time. VSandritter RN/[facility]ABC

## 2012-03-06 ENCOUNTER — Inpatient Hospital Stay (HOSPITAL_COMMUNITY): Payer: PRIVATE HEALTH INSURANCE | Admitting: Registered Nurse

## 2012-03-06 ENCOUNTER — Inpatient Hospital Stay (HOSPITAL_COMMUNITY): Payer: PRIVATE HEALTH INSURANCE

## 2012-03-06 ENCOUNTER — Encounter (HOSPITAL_COMMUNITY): Payer: Self-pay | Admitting: Registered Nurse

## 2012-03-06 ENCOUNTER — Encounter (HOSPITAL_COMMUNITY): Payer: Self-pay | Admitting: Gastroenterology

## 2012-03-06 ENCOUNTER — Encounter (HOSPITAL_COMMUNITY)
Admission: EM | Disposition: A | Discharge status: Home / Self Care | Payer: Self-pay | Source: Home / Self Care | Attending: Internal Medicine

## 2012-03-06 ENCOUNTER — Encounter (HOSPITAL_COMMUNITY): Payer: Self-pay | Admitting: *Deleted

## 2012-03-06 DIAGNOSIS — R109 Unspecified abdominal pain: Secondary | ICD-10-CM

## 2012-03-06 DIAGNOSIS — IMO0001 Reserved for inherently not codable concepts without codable children: Secondary | ICD-10-CM

## 2012-03-06 DIAGNOSIS — G8929 Other chronic pain: Secondary | ICD-10-CM

## 2012-03-06 DIAGNOSIS — K219 Gastro-esophageal reflux disease without esophagitis: Secondary | ICD-10-CM

## 2012-03-06 LAB — GLUCOSE, CAPILLARY
Glucose-Capillary: 124 mg/dL — ABNORMAL HIGH (ref 70–99)
Glucose-Capillary: 176 mg/dL — ABNORMAL HIGH (ref 70–99)
Glucose-Capillary: 128 mg/dL — ABNORMAL HIGH (ref 70–99)
Glucose-Capillary: 134 mg/dL — ABNORMAL HIGH (ref 70–99)

## 2012-03-06 SURGERY — EGD (ESOPHAGOGASTRODUODENOSCOPY)
Anesthesia: Monitor Anesthesia Care

## 2012-03-06 SURGERY — ESOPHAGOGASTRODUODENOSCOPY (EGD) WITH PROPOFOL
Anesthesia: Monitor Anesthesia Care

## 2012-03-06 MED ORDER — PROMETHAZINE HCL 25 MG/ML IJ SOLN: 6.2500 mg | INTRAMUSCULAR | Status: DC | PRN

## 2012-03-06 MED ORDER — LACTATED RINGERS IV SOLN
INTRAVENOUS | Status: DC | PRN
  Administered 2012-03-06: 07:00:00 via INTRAVENOUS

## 2012-03-06 MED ORDER — PROPOFOL 10 MG/ML IV EMUL
INTRAVENOUS | Status: DC | PRN
  Administered 2012-03-06: 100 ug/kg/min via INTRAVENOUS

## 2012-03-06 MED ORDER — FLUCONAZOLE 100 MG PO TABS
100.0000 mg | ORAL_TABLET | Freq: Every day | ORAL | Status: AC
  Administered 2012-03-06 – 2012-03-10 (×5): 100 mg via ORAL
  Filled 2012-03-06 (×5): qty 1

## 2012-03-06 MED ORDER — FENTANYL CITRATE 0.05 MG/ML IJ SOLN: 25.0000 ug | INTRAMUSCULAR | Status: DC | PRN

## 2012-03-06 MED ORDER — LACTATED RINGERS IV SOLN: INTRAVENOUS | Status: DC

## 2012-03-06 MED ORDER — MIDAZOLAM HCL 5 MG/5ML IJ SOLN
INTRAMUSCULAR | Status: DC | PRN
  Administered 2012-03-06: 2 mg via INTRAVENOUS

## 2012-03-06 MED ORDER — KETAMINE HCL 10 MG/ML IJ SOLN
INTRAMUSCULAR | Status: DC | PRN
  Administered 2012-03-06: 10 mg via INTRAVENOUS
  Administered 2012-03-06: 20 mg via INTRAVENOUS

## 2012-03-06 MED ORDER — LIDOCAINE HCL (CARDIAC) 20 MG/ML IV SOLN
INTRAVENOUS | Status: DC | PRN
  Administered 2012-03-06: 100 mg via INTRAVENOUS

## 2012-03-06 MED ORDER — FENTANYL CITRATE 0.05 MG/ML IJ SOLN
INTRAMUSCULAR | Status: DC | PRN
  Administered 2012-03-06 (×2): 50 ug via INTRAVENOUS

## 2012-03-06 SURGICAL SUPPLY — 15 items

## 2012-03-06 NOTE — H&P (Signed)
Agree with above.  Plan for percutaneous GJ placement tomorrow with Dr. Ronny Bacon.  Signed,  Criselda Peaches, MD Vascular & Interventional Radiologist [facility] Radiology

## 2012-03-06 NOTE — Progress Notes (Signed)
Barium Sulfate suspension given po and patient took it all.

## 2012-03-06 NOTE — Preoperative (Signed)
Beta Blockers   Reason not to administer Beta Blockers:no beta blockers per Dr. Winfred Leeds.

## 2012-03-06 NOTE — Interval H&P Note (Signed)
History and Physical Interval Note:  03/06/2012 7:37 AM  [name]  has presented today for surgery, with the diagnosis of abd.pain  The various methods of treatment have been discussed with the patient and family. After consideration of risks, benefits and other options for treatment, the patient has consented to  Procedure(s) (LRB): ESOPHAGOGASTRODUODENOSCOPY (EGD) WITH PROPOFOL (N/A) as a surgical intervention .  The patient's history has been reviewed, patient examined, no change in status, stable for surgery.  I have reviewed the patient's chart and labs.  Questions were answered to the patient's satisfaction.     Moet Mikulski JR,Jamarques Pinedo L

## 2012-03-06 NOTE — Progress Notes (Signed)
Have discussed with the patient and with radiology. We'll proceed tomorrow with gastrostomy tube and placement under fluoroscopy a jejunostomy tube. Hopefully, this will allow initiation of tube feeding and discharged home on liquid medications. The patient has had 4 previous feeding tubes percutaneously and understands the procedure.

## 2012-03-06 NOTE — Op Note (Signed)
[facility] [place], [place]  [id number]  ENDOSCOPY PROCEDURE REPORT  PATIENT:  [name], [name]  MR#:  [id number] BIRTHDATE:  1970-10-15, 41 yrs. old  GENDER:  female  ENDOSCOPIST:  Laurence Spates Referred by:  [facility],  PROCEDURE DATE:  03/06/2012 PROCEDURE:  EGD w/brushings, EGD with biopsy, [id number] ASA CLASS:  Class II INDICATIONS:  abdominal pain despite treatment , history of chronic pain, narcotics dependance, previous feeding tubes in othe locations.  MEDICATIONS:   MAC sedation, administered by CRNA TOPICAL ANESTHETIC:  Cetacaine Spray  DESCRIPTION OF PROCEDURE:   After the risks and benefits of the procedure were explained, informed consent was obtained.  The Pentax [id number] endoscope was introduced through the mouth and advanced to the second portion of the duodenum.  The instrument was slowly withdrawn as the mucosa was fully examined. <<PROCEDUREIMAGES>>  Esophagitis was found in the distal esophagus. this had the possible appearance of fungal esophagitis but was located only in the bottom half of the esophagus. Brushings for fungal smear and culture and biopsies were obtained. The more proximal esophagus appeared to be normal. With standard forceps, a biopsy was obtained and sent to pathology. A brushing for microbiology was obtained for fungal analysis.  nl stomach.  the duodenal bulb and second duodenum were without abnormalities.    Retroflexed views revealed no abnormalities.    The scope was then withdrawn from the patient and the procedure completed.  COMPLICATIONS:  A complication of none occurred on 03/06/2012 at.  ENDOSCOPIC IMPRESSION: 1) Esophagitis in the distal esophagus 2) Nl stomach 3) Nl duodenum this could be fungal esophagitis or possibly just reflux to her vomiting resulting from her known gastroparesis. RECOMMENDATIONS: 1) continue PPI where a course of Diflucan. We'll discuss with radiology  the possibility of inserting a gastrostomy/jejunostomy tube.  ______________________________ Laurence Spates  CC:  n. eSIGNEDLaurence Spates at 03/06/2012 08:20 AM  Jason Coop, [id number]

## 2012-03-06 NOTE — Progress Notes (Signed)
[facility] PROGRESS NOTE  [name] [id number] DOB: 1970/11/04 DOA: 02/25/2012 PCP: [name], MD  Brief narrative:  41 year old African American female with a history of diabetes with gastroparesis, chronic pancreatitis, hypertension, gastroesophageal reflux disease, coronary artery disease, COPD, neuropathy admitted with diffuse abdominal pain due to gastroparesis vs narcotic induced pain.  Assessment/Plan:  Acute ileus / abdominal pain  -Likely secondary gastroparesis and use of chronic opioids.  -symptomatic treatment/supportive care with Phenergan and Reglan. Patient has not improved and not able to tolerate diet. Started on IV erythromycin on 8/2 without much benifit. She has required feeding tube in past  -need to be cautious with use of narcotics, but pt still with c/o requiring increased meds for pain control  -CT of abd and pelvis with no evidence of acute obstrction  -Appreciate GI recs. -patient taken for endoscopy last week but was unable to pass gastroscope as she was agitated  -Patient refused for gastric emptying study. Did not have full UGI series.  - scheduled for EGD under anesthesia this morning and showed some distal esophagitis which could be fungal vs reflux. -however cannot explain the magnitude of her pain.  -started on diflucan on 8/6 . Scheduled for G tube placement for feeds by IR tomorrow am.  -eventually needs referral to pain clinic/ center by her PCP.  gastroparesis.  Secondary to diabetes. Continue Reglan as above. Added erythromycin without much benifit  hypokalemia  Replenished kcl   dehydration.  Cont IV fluids   chronic pancreatitis/chronic pain  -lipase wnl, CT abd as above, pain management   diabetes mellitus.  -Sliding scale insulin   DVT prophylaxis. Sq Lovenox   Code Status: full  Family Communication: none  Disposition Plan: home once stable. Pending G tube placement on 8/7. Needs outpatient pain clinic referral by  PCP.  Left femoral line removed and a new central line placed today  Consultants:  [facility]   Procedures:   EGD under anesthesia on 8/6 For PEG placement on 8/7 by IR  Antibiotics:  None    HPI/Subjective: Still has abdominal  pain  Off and on.   Objective: Filed Vitals:   03/06/12 0830 03/06/12 0840 03/06/12 0945 03/06/12 1444  BP: 135/88 157/99  170/80  Pulse:    93  Temp:    98.5 F (36.9 C)  TempSrc:    Oral  Resp: 15 12  18   Height:      Weight:      SpO2: 100% 100% 95% 96%    Intake/Output Summary (Last 24 hours) at 03/06/12 1632 Last data filed at 03/06/12 1445  Gross per 24 hour  Intake   1682 ml  Output   3250 ml  Net  -1568 ml    Exam:  General: NAD  HEENT: no pallor, moist oral mucosa,  Cardiovascular: NS1 &S2, no murmurs  Respiratory: clear b/l , no added sounds  Abdomen: soft, tender to palpation,, BS+  Ext: warm, no edema  CNS: AAOX3, non focal   Data Reviewed: Basic Metabolic Panel:  Lab [id number] 03/03/12 0540 02/29/12 0413  NA 137 -- 135  K 3.6 -- 3.3*  CL 104 -- 102  CO2 28 -- 26  GLUCOSE 111* -- 170*  BUN 3* -- 3*  CREATININE 0.47* 0.50 0.46*  CALCIUM 8.2* -- 8.4  MG -- -- 1.6  PHOS -- -- --   Liver Function Tests: No results found for this basename: AST:5,ALT:5,ALKPHOS:5,BILITOT:5,PROT:5,ALBUMIN:5 in the last 168 hours No results found for this basename:  LIPASE:5,AMYLASE:5 in the last 168 hours No results found for this basename: AMMONIA:5 in the last 168 hours CBC: No results found for this basename: WBC:5,NEUTROABS:5,HGB:5,HCT:5,MCV:5,PLT:5 in the last 168 hours Cardiac Enzymes: No results found for this basename: CKTOTAL:5,CKMB:5,CKMBINDEX:5,TROPONINI:5 in the last 168 hours BNP (last 3 results) No results found for this basename: PROBNP:3 in the last 8760 hours CBG:  Lab 03/06/12 1142 03/06/12 0715 03/05/12 2203 03/05/12 1758 03/05/12 1229  GLUCAP 176* 124* 238* 124* 105*    No results found for this or any  previous visit (from the past 240 hour(s)).   Studies: Ct Abdomen Pelvis Wo Contrast  02/26/2012  *RADIOLOGY REPORT*  Clinical Data: 41 year old female with abdominal and pelvic pain. History of previous bowel obstructions and bowel resections.  CT ABDOMEN AND PELVIS WITHOUT CONTRAST  Technique:  Multidetector CT imaging of the abdomen and pelvis was performed following the standard protocol without intravenous contrast. Intravenous contrast was not administered due to inability to gain IV access.  Comparison: 06/10/2011  Findings: The liver, spleen, kidneys, adrenal glands and pancreas are unremarkable. Please note that parenchymal abnormalities may be missed as intravenous contrast was not administered. The patient is status post cholecystectomy and hysterectomy.  Circumferentially thickened bladder with gas again noted - question chronic cystitis versus bladder hypertrophy with recent catheterization.  No free fluid, enlarged lymph nodes, biliary dilation or abdominal aortic aneurysm identified.  No evidence of previous bowel surgery noted. The visualized bowel is unremarkable.  There is no evidence of bowel obstruction or pneumoperitoneum.  Foci of gas within the right gluteus maximus and anterior right pelvic subcutaneous tissues are likely related to injection sites but correlate clinically.  No acute or suspicious bony abnormalities are identified. Prior lower lumbar fusion again noted.  IMPRESSION: No evidence of acute abnormality.  Chronically thickened bladder containing gas again noted.  Question chronic cystitis versus bladder hypertrophy with recent catheterization.  Original Report Authenticated By: Lura Em, M.D.   Dg Abd 1 View  02/25/2012  *RADIOLOGY REPORT*  Clinical Data: Abdominal pain, vomiting  ABDOMEN - 1 VIEW  Comparison: 02/09/2012  Findings: Post cholecystectomy surgical clips are noted.  Stable postsurgical changes lumbar spine.  Mild gaseous distended bowel loops mid abdomen  suspicious for ileus.  IMPRESSION: Gaseous distended bowel loops mid abdomen suspicious for ileus. Stable postsurgical changes.  Original Report Authenticated By: Lahoma Crocker, M.D.   Dg Esophagus  03/01/2012  *RADIOLOGY REPORT*  Clinical Data: Nausea/vomiting, abdominal pain  ESOPHOGRAM/BARIUM SWALLOW  Technique:  Single contrast examination was performed using thick barium.  Fluoroscopy time:  41 seconds.  Comparison:  None.  Note:  The patient was unable to tolerate effervescent crystals and more than a single swallow of thick barium.  As such, following initial evaluation of the esophagus, the study was aborted.  Findings:  No laryngeal penetration or tracheal bronchial aspiration.  Esophageal peristalsis is relatively preserved.  Although patent, the distal esophagus/lower esophageal sphincter remains narrowed throughout the majority of the evaluation. Therefore, a lower esophageal stricture cannot be excluded.  The stomach could not be satisfactorily evaluated.  IMPRESSION: The study was prematurely aborted as the patient could not tolerate more than a single swallow of thick barium.  Normal esophageal peristalsis.  Persistent narrowing of the distal esophagus/lower esophageal sphincter, although patent.  Lower esophageal stricture cannot be excluded.  Original Report Authenticated By: Julian Hy, M.D.   Ir Fluoro Guide Cv Line Right  02/10/2012  *RADIOLOGY REPORT*  Clinical Data: Nausea  and vomiting, history of  chronic pancreatitis; request is made for central venous access  for fluids and medications.  PICC LINE PLACEMENT WITH ULTRASOUND AND FLUOROSCOPIC  GUIDANCE  Fluoroscopy Time: 1.3 minutes.  The right  arm was prepped with chlorhexidine, draped in the usual sterile fashion using maximum barrier technique (cap and mask, sterile gown, sterile gloves, large sterile sheet, hand hygiene and cutaneous antisepsis) and infiltrated locally with 1% Lidocaine.  Ultrasound demonstrated patency of the right  brachial vein, and this was documented with an image.  Under real-time ultrasound guidance, this vein was accessed with a 21 gauge micropuncture needle and image documentation was performed.  The needle was exchanged over a guidewire for a peel-away sheath through which a five [place] double lumen PICC trimmed to 38 cm was advanced, positioned with its tip at the lower SVC/right atrial junction. Fluoroscopy during the procedure and fluoro spot radiograph confirms appropriate catheter position.  The catheter was flushed, secured to the skin with Prolene sutures, and covered with a sterile dressing.  Complications:  None  IMPRESSION: Successful right arm PICC line placement with ultrasound and fluoroscopic guidance.  The catheter is ready for use.  Read by: Rowe Robert, P.A.-C  Original Report Authenticated By: Azzie Roup, M.D.   Ir US Guide Vasc Access Right  02/24/2012  *RADIOLOGY REPORT*  Clinical Data: Nausea  and vomiting, history of chronic pancreatitis; request is made for central venous access  for fluids and medications.  PICC LINE PLACEMENT WITH ULTRASOUND AND FLUOROSCOPIC  GUIDANCE  Fluoroscopy Time: 1.3 minutes.  The right  arm was prepped with chlorhexidine, draped in the usual sterile fashion using maximum barrier technique (cap and mask, sterile gown, sterile gloves, large sterile sheet, hand hygiene and cutaneous antisepsis) and infiltrated locally with 1% Lidocaine.  Ultrasound demonstrated patency of the right brachial vein, and this was documented with an image.  Under real-time ultrasound guidance, this vein was accessed with a 21 gauge micropuncture needle and image documentation was performed.  The needle was exchanged over a guidewire for a peel-away sheath through which a five [place] double lumen PICC trimmed to 38 cm was advanced, positioned with its tip at the lower SVC/right atrial junction. Fluoroscopy during the procedure and fluoro spot radiograph confirms appropriate catheter  position.  The catheter was flushed, secured to the skin with Prolene sutures, and covered with a sterile dressing.  Complications:  None  IMPRESSION: Successful right arm PICC line placement with ultrasound and fluoroscopic guidance.  The catheter is ready for use.  Read by: Rowe Robert, P.A.-C  Original Report Authenticated By: Azzie Roup, M.D.   Dg Chest Port 1 View  03/06/2012  *RADIOLOGY REPORT*  Clinical Data: Central line placement.  PORTABLE CHEST - 1 VIEW  Comparison: None.  Findings: Heart and mediastinal contours are within normal limits. No focal opacities or effusions.  No acute bony abnormality. Left central line is in place with the tip in the SVC.  No pneumothorax.  IMPRESSION: No active cardiopulmonary disease.  Original Report Authenticated By: Raelyn Number, M.D.   Dg Abd Acute W/chest  02/09/2012  *RADIOLOGY REPORT*  Clinical Data: Vomiting, abdominal pain  ACUTE ABDOMEN SERIES (ABDOMEN 2 VIEW & CHEST 1 VIEW)  Comparison: 01/12/2012  Findings: Nasogastric tube extends into stomach though the proximal side port is at the distal esophagus. Right arm PICC line, tip projecting over cavoatrial junction. Normal heart size, mediastinal contours, and pulmonary vascularity. Lungs clear. Numerous cardiac monitoring leads project over chest. Prior L4-L5 fusion  and L4 laminectomy.  Surgical clips right upper quadrant question cholecystectomy. Metallic foreign body question BB projects over left pelvis. Marked osseous demineralization. Nonobstructive bowel gas pattern. No definite bowel dilatation, bowel wall thickening or free intraperitoneal air. Gas collection identified in pelvis, uncertain if represents bowel or air filled bladder. Anastomotic staple lines from bowel surgery in the left mid abdomen. Single tiny questionable nonobstructing right renal calculus.  IMPRESSION: Recommend advancing nasogastric tube 8 cm to place the proximal side port in the proximal stomach. Nonobstructive  bowel gas pattern though a paucity of bowel gas is noted. Rounded gas collection in the pelvis, question bowel versus air filled urinary bladder; has the patient been recently catheterized?  Original Report Authenticated By: Burnetta Sabin, M.D.    Scheduled Meds:   . enoxaparin (LOVENOX) injection  40 mg Subcutaneous Q24H  . erythromycin  250 mg Intravenous Q6H  . fentaNYL  25 mcg Transdermal Q72H  . fluconazole  100 mg Oral Daily  . Fluticasone-Salmeterol  1 puff Inhalation BID  . hydrALAZINE  10 mg Intravenous Q6H  . insulin aspart  0-9 Units Subcutaneous TID WC  . metoCLOPramide (REGLAN) injection  10 mg Intravenous Q6H  . oxyCODONE  10 mg Oral Q12H  . pantoprazole (PROTONIX) IV  40 mg Intravenous BID  . potassium chloride  40 mEq Oral Once  . scopolamine  1 patch Transdermal Q72H  . sodium chloride  3 mL Intravenous Q12H   Continuous Infusions:   . dextrose 5 % and 0.9% NaCl 1,000 mL with potassium chloride 20 mEq infusion 100 mL/hr at 03/06/12 1547  . DISCONTD: lactated ringers        Time spent: 30 minutes    Selia Wareing  [facility] Pager [phone]. If 8PM-8AM, please contact night-coverage at [email or web address], password [facility] 03/06/2012, 4:32 PM  LOS: 10 days

## 2012-03-06 NOTE — Transfer of Care (Signed)
Immediate Anesthesia Transfer of Care Note  Patient: [name]  Procedure(s) Performed: Procedure(s) (LRB): ESOPHAGOGASTRODUODENOSCOPY (EGD) WITH PROPOFOL (N/A)  Patient Location: PACU and Endoscopy Unit  Anesthesia Type: MAC  Level of Consciousness: awake, alert , oriented and patient cooperative  Airway & Oxygen Therapy: Patient Spontanous Breathing and Patient connected to face mask oxygen  Post-op Assessment: Report given to PACU RN, Post -op Vital signs reviewed and stable and Patient moving all extremities  Post vital signs: Reviewed and stable  Complications: No apparent anesthesia complications

## 2012-03-06 NOTE — H&P (View-Only) (Signed)
[facility] PROGRESS NOTE Subjective Patient continues to complain of pain, and more pain medications, and refused to eat much. Apparently, erythromycin did not help. She gives me the additional history that she had a feeding tube in her jejunum about 6 years ago and after this came out or was removed she has been living on Gatorade, ice cream etc. and has been unable to keep anything else. She is willing to have another feeding tube.  Objective: Vital signs in last 24 hours: Temp:  [97.9 F (36.6 C)-99.6 F (37.6 C)] 99.6 F (37.6 C) (08/05 0611) Pulse Rate:  [98-115] 115  (08/05 0325) Resp:  [18-20] 20  (08/05 0611) BP: (135-168)/(86-102) 168/102 mmHg (08/05 0611) SpO2:  [97 %-100 %] 97 % (08/05 0810) FiO2 (%):  [21 %] 21 % (08/05 0810) Last BM Date: 03/02/12  Intake/Output from previous day: 08/04 0701 - 08/05 0700 In: 3700 [I.V.:3700] Out: 800 [Urine:800] Intake/Output this shift:    PE: Gen.-patient appears to be in somewhat pain Abdomen-positive bowel sounds mild upper tenderness.  Lab Results: No results found for this basename: WBC:5,HGB:5,HCT:5,PLT:5 in the last 72 hours BMET  Basename 03/03/12 1036 03/03/12 0540  NA 137 --  K 3.6 --  CL 104 --  CO2 28 --  CREATININE 0.47* 0.50   LFT No results found for this basename: PROT:3ALBUMIN:3,AST:3,ALT:3,ALKPHOS:3,BILITOT:3,BILIDIR:3,IBILI:3 in the last 72 hours PT/INR No results found for this basename: LABPROT:3,INR:3 in the last 72 hours PANCREAS No results found for this basename: LIPASE:3 in the last 72 hours       Studies/Results: No results found.  Medications: I have reviewed the patient's current medications.  Assessment/Plan: 1. Abdominal pain-gastroparesis-chronic pancreatitis. This clearly has been a chronic problem for her. She reports extensive testing and therapy in [place] as well as and [place] including placement of a feeding gastrostomy or jejunostomy tube on 2 previous  occasions. I think she does need an EGD just to make certain nothing else is going on and she is willing to do this with propofol. This is scheduled in the morning at 7:30. If this is negative, I think we should proceed with radiology performing a gastrostomy tube in threading a small bowel feeding tube. She could then be started on tube feeding and liquid pain medication with discharge home and followup in a pain clinic.   Ronne Savoia JR,Lavaya Defreitas L 03/05/2012, 9:07 AM

## 2012-03-06 NOTE — Procedures (Signed)
Central Venous Catheter Insertion Procedure Note [name] [id number] 1971-06-03  Procedure: Insertion of Central Venous Catheter Indications: Drug and/or fluid administration  Procedure Details Consent: Risks of procedure as well as the alternatives and risks of each were explained to the (patient/caregiver).  Consent for procedure obtained. Time Out: Verified patient identification, verified procedure, site/side was marked, verified correct patient position, special equipment/implants available, medications/allergies/relevent history reviewed, required imaging and test results available.  Performed Real time [place] used to ID and cannulate the left IJ vessel.  Maximum sterile technique was used including antiseptics, cap, gloves, gown, hand hygiene, mask and sheet. Skin prep: Chlorhexidine; local anesthetic administered A antimicrobial bonded/coated triple lumen catheter was placed in the left internal jugular vein using the Seldinger technique.  Evaluation Blood flow good Complications: No apparent complications Patient did tolerate procedure well. Chest X-ray ordered to verify placement.  CXR: normal.  BABCOCK,PETE 03/06/2012, 2:25 PM  Performed under my supervision  Christinia Gully, MD [facility] [phone]

## 2012-03-06 NOTE — H&P (Signed)
Chief Complaint: Chronic gastroparesis and malnutrition Referring Physician:Jamed Oletta Lamas, MD HPI: [name] is an 41 y.o. female with chronic abdominal pain, gastroparesis, and malnutrition. She has had to have PEG tubes placed in the past but then was able to have them removed as she tolerated po intake. She is at the point again where she is unable to tolerated po intake and maintain adequate nutrition. She has been seen by GI who did not find much on her EGD, but recommends placing a gastrojejunostomy tube for enteral nutrition again. He feels the jejunal feeds will be better tolerated due to her gastroparesis. She understands and is familiar with the procedure from prior experience.  Past Medical History:  Past Medical History  Diagnosis Date  . Hypertension   . Diabetes mellitus   . Gastroparesis   . Asthma   . GERD (gastroesophageal reflux disease)   . Coronary artery disease   . h/o seizure   . Neuropathy   . Chronic pancreatitis   . Dyslipidemia   . MI (myocardial infarction)   . COPD (chronic obstructive pulmonary disease)     Past Surgical History:  Past Surgical History  Procedure Date  . Abdominal hysterectomy   . Cholecystectomy   . Peg tube x 4     feeding jejunostomies with PEG tubes  . Cesarean section   . Colon resection due to diverticulitis   . Back surgery     Family History:  Family History  Problem Relation Age of Onset  . Cancer Mother     Social History:  reports that she has been smoking Cigarettes.  She has a .5 pack-year smoking history. She has never used smokeless tobacco. She reports that she does not drink alcohol or use illicit drugs.  Allergies:  Allergies  Allergen Reactions  . Penicillins Hives  . Compazine Hives  . Darvocet (Propoxyphene-Acetaminophen) Other (See Comments)    UNKNOWN  . Nsaids Hives  . Zofran Hives    Medications: Medications Prior to Admission  Medication Sig Dispense Refill  . aspirin 81 MG EC tablet  Take 1 tablet (81 mg total) by mouth daily.  30 tablet  0  . atorvastatin (LIPITOR) 40 MG tablet Take 1 tablet (40 mg total) by mouth daily.  30 tablet  0  . docusate sodium (COLACE) 100 MG capsule Take 100 mg by mouth 2 (two) times daily.      Marland Kitchen esomeprazole (NEXIUM) 40 MG capsule Take 1 capsule (40 mg total) by mouth daily before breakfast.  30 capsule  0  . fentaNYL (DURAGESIC - DOSED MCG/HR) 25 MCG/HR Place 1 patch (25 mcg total) onto the skin every 3 (three) days.  5 patch  0  . Fluticasone-Salmeterol (ADVAIR) 250-50 MCG/DOSE AEPB Inhale 1 puff into the lungs 2 (two) times daily.  60 each  0  . gabapentin (NEURONTIN) 600 MG tablet Take 1 tablet (600 mg total) by mouth 3 (three) times daily.  90 tablet  0  . insulin glargine (LANTUS) 100 UNIT/ML injection Inject 10 Units into the skin at bedtime.  3 mL  4  . insulin lispro (HUMALOG) 100 UNIT/ML injection Inject 10-20 Units into the skin 3 (three) times daily before meals. Sliding scale  10 mL  2  . lisinopril (PRINIVIL) 10 MG tablet Take 2 tablets (20 mg total) by mouth daily.  60 tablet  0  . metoprolol succinate (TOPROL XL) 50 MG 24 hr tablet Take 1 tablet (50 mg total) by mouth daily.  Stites  tablet  0  . Multiple Vitamin (MULITIVITAMIN WITH MINERALS) TABS Take 1 tablet by mouth daily.  30 tablet  0  . pantoprazole (PROTONIX) 20 MG tablet Take 1 tablet (20 mg total) by mouth daily.  30 tablet  0  . pregabalin (LYRICA) 100 MG capsule Take 100 mg by mouth 2 (two) times daily.      Marland Kitchen zolpidem (AMBIEN) 5 MG tablet Take 1 tablet (5 mg total) by mouth at bedtime as needed for sleep (insomnia).  30 tablet  0    Please HPI for pertinent positives, otherwise complete 10 system ROS negative.  Physical Exam: Blood pressure 157/99, pulse 102, temperature 98.2 F (36.8 C), temperature source Oral, resp. rate 12, height 5\' 4"  (1.626 m), weight 125 lb 14.1 oz (57.1 kg), SpO2 95.00%. Body mass index is 21.61 kg/(m^2).   General Appearance:  Alert,  cooperative, no distress, appears stated age  Head:  Normocephalic, without obvious abnormality, atraumatic  ENT: Unremarkable  Neck: Supple, symmetrical, trachea midline, no adenopathy, thyroid: not enlarged, symmetric, no tenderness/mass/nodules  Lungs:   Clear to auscultation bilaterally, no w/r/r, respirations unlabored without use of accessory muscles.  Heart:  Regular rate and rhythm, S1, S2 normal, no murmur, rub or gallop. Carotids 2+ without bruit.  Abdomen:   Soft, non-tender, non distended. Scars from prior PEGs noted  Extremities: Extremities normal, atraumatic, no cyanosis or edema  Neurologic: Normal affect, no gross deficits.   Results for orders placed during the hospital encounter of 02/25/12 (from the past 48 hour(s))  GLUCOSE, CAPILLARY     Status: Abnormal   Collection Time   03/04/12  4:38 PM      Component Value Range Comment   Glucose-Capillary 142 (*) 70 - 99 mg/dL   GLUCOSE, CAPILLARY     Status: Normal   Collection Time   03/04/12 10:15 PM      Component Value Range Comment   Glucose-Capillary 97  70 - 99 mg/dL    Comment 1 Notify RN     GLUCOSE, CAPILLARY     Status: Abnormal   Collection Time   03/05/12  7:50 AM      Component Value Range Comment   Glucose-Capillary 187 (*) 70 - 99 mg/dL   GLUCOSE, CAPILLARY     Status: Abnormal   Collection Time   03/05/12 12:29 PM      Component Value Range Comment   Glucose-Capillary 105 (*) 70 - 99 mg/dL   GLUCOSE, CAPILLARY     Status: Abnormal   Collection Time   03/05/12  5:58 PM      Component Value Range Comment   Glucose-Capillary 124 (*) 70 - 99 mg/dL   GLUCOSE, CAPILLARY     Status: Abnormal   Collection Time   03/05/12 10:03 PM      Component Value Range Comment   Glucose-Capillary 238 (*) 70 - 99 mg/dL    Comment 1 Documented in Chart      Comment 2 Notify RN     GLUCOSE, CAPILLARY     Status: Abnormal   Collection Time   03/06/12  7:15 AM      Component Value Range Comment   Glucose-Capillary 124 (*) 70 -  99 mg/dL   GLUCOSE, CAPILLARY     Status: Abnormal   Collection Time   03/06/12 11:42 AM      Component Value Range Comment   Glucose-Capillary 176 (*) 70 - 99 mg/dL    Comment 1 Documented in Chart  Comment 2 Notify RN      No results found.  Assessment/Plan Chronic gastroparesis Malnutrition Check coags Discussed gastrojejunostomy procedure with pt including risks. She had to have MAC for EGD earlier today but we will try with moderate sedation and local anesthesia for procedure tomorrow. If she cannot tolerate, she would have to be set up with anesthesia, of which availability for [place] is limited. Plan pt to drink thin barium tonight to ensure adequate window for percutaneous access tomorrow. Consent signed in chart Will hold Lovenox tonight.  Ascencion Dike PA-C 03/06/2012, 12:18 PM

## 2012-03-06 NOTE — Anesthesia Preprocedure Evaluation (Addendum)
Anesthesia Evaluation    Airway Mallampati: II TM Distance: >3 FB Neck ROM: Full    Dental  (+) Edentulous Upper and Dental Advisory Given   Pulmonary asthma , COPDCurrent Smoker,  breath sounds clear to auscultation  Pulmonary exam normal       Cardiovascular hypertension, Pt. on medications and Pt. on home beta blockers + CAD, + Past MI, + Cardiac Stents (cardiac stents x 2) and +CHF Rhythm:Regular Rate:Normal     Neuro/Psych Seizures -,     GI/Hepatic GERD-  Medicated,Chronic pancreatitis   Endo/Other  Type 2, Insulin Dependent  Renal/GU Renal InsufficiencyRenal disease     Musculoskeletal   Abdominal   Peds  Hematology   Anesthesia Other Findings   Reproductive/Obstetrics                         Anesthesia Physical Anesthesia Plan  ASA: III  Anesthesia Plan: MAC   Post-op Pain Management:    Induction: Intravenous  Airway Management Planned: Nasal Cannula  Additional Equipment:   Intra-op Plan:   Post-operative Plan:   Informed Consent: I have reviewed the patients History and Physical, chart, labs and discussed the procedure including the risks, benefits and alternatives for the proposed anesthesia with the patient or authorized representative who has indicated his/her understanding and acceptance.   Dental advisory given  Plan Discussed with: CRNA  Anesthesia Plan Comments:         Anesthesia Quick Evaluation

## 2012-03-06 NOTE — Anesthesia Postprocedure Evaluation (Signed)
Anesthesia Post Note  Patient: [name]  Procedure(s) Performed: Procedure(s) (LRB): ESOPHAGOGASTRODUODENOSCOPY (EGD) WITH PROPOFOL (N/A)  Anesthesia type: MAC  Patient location: PACU  Post pain: Pain level controlled  Post assessment: Post-op Vital signs reviewed  Last Vitals:  Filed Vitals:   03/06/12 0840  BP: 157/99  Pulse:   Temp:   Resp: 12    Post vital signs: Reviewed  Level of consciousness: sedated  Complications: No apparent anesthesia complications

## 2012-03-07 ENCOUNTER — Inpatient Hospital Stay (HOSPITAL_COMMUNITY): Payer: PRIVATE HEALTH INSURANCE

## 2012-03-07 DIAGNOSIS — K5900 Constipation, unspecified: Secondary | ICD-10-CM

## 2012-03-07 LAB — GLUCOSE, CAPILLARY
Glucose-Capillary: 182 mg/dL — ABNORMAL HIGH (ref 70–99)
Glucose-Capillary: 91 mg/dL (ref 70–99)
Glucose-Capillary: 132 mg/dL — ABNORMAL HIGH (ref 70–99)
Glucose-Capillary: 144 mg/dL — ABNORMAL HIGH (ref 70–99)

## 2012-03-07 MED ORDER — IOHEXOL 300 MG/ML  SOLN: 25.0000 mL | Freq: Once | INTRAMUSCULAR | Status: AC | PRN

## 2012-03-07 MED ORDER — MIDAZOLAM HCL 5 MG/5ML IJ SOLN
INTRAMUSCULAR | Status: AC | PRN
  Administered 2012-03-07 (×2): 2 mg via INTRAVENOUS

## 2012-03-07 MED ORDER — HYDROMORPHONE HCL PF 1 MG/ML IJ SOLN
INTRAMUSCULAR | Status: AC
  Administered 2012-03-07: 1 mg
  Filled 2012-03-07: qty 1

## 2012-03-07 MED ORDER — VANCOMYCIN HCL IN DEXTROSE 1-5 GM/200ML-% IV SOLN
INTRAVENOUS | Status: AC
  Filled 2012-03-07: qty 200

## 2012-03-07 MED ORDER — VANCOMYCIN HCL IN DEXTROSE 1-5 GM/200ML-% IV SOLN
1000.0000 mg | Freq: Once | INTRAVENOUS | Status: AC
  Administered 2012-03-07: 1000 mg via INTRAVENOUS
  Filled 2012-03-07: qty 200

## 2012-03-07 MED ORDER — GLUCAGON HCL (RDNA) 1 MG IJ SOLR
1.0000 mg | Freq: Once | INTRAMUSCULAR | Status: AC
  Administered 2012-03-07: 1 mg via INTRAVENOUS

## 2012-03-07 MED ORDER — HYDROMORPHONE HCL PF 1 MG/ML IJ SOLN
1.0000 mg | INTRAMUSCULAR | Status: DC | PRN
  Administered 2012-03-07: 1 mg via INTRAVENOUS
  Filled 2012-03-07: qty 1

## 2012-03-07 MED ORDER — FENTANYL CITRATE 0.05 MG/ML IJ SOLN
INTRAMUSCULAR | Status: AC | PRN
  Administered 2012-03-07 (×3): 100 ug via INTRAVENOUS

## 2012-03-07 MED ORDER — GLUCAGON HCL (RDNA) 1 MG IJ SOLR
INTRAMUSCULAR | Status: AC
  Filled 2012-03-07: qty 1

## 2012-03-07 MED ORDER — LIDOCAINE HCL 1 % IJ SOLN
INTRAMUSCULAR | Status: AC
  Filled 2012-03-07: qty 20

## 2012-03-07 MED ORDER — HYDROMORPHONE HCL PF 2 MG/ML IJ SOLN
2.0000 mg | INTRAMUSCULAR | Status: DC | PRN
  Administered 2012-03-07 – 2012-03-14 (×67): 2 mg via INTRAVENOUS
  Filled 2012-03-07 (×67): qty 1

## 2012-03-07 NOTE — Procedures (Signed)
58f Gastrostomy tube placed under fluoro No complication No blood loss. See complete dictation in [facility]. Needs to return for coaxial jejunal limb placement under fluoro.

## 2012-03-07 NOTE — Progress Notes (Signed)
Patient ID: [name], female   DOB: 28-Feb-1971, 41 y.o.   MRN: [id number]  [facility] PROGRESS NOTE  [name] [id number] DOB: 26-Feb-1971 DOA: 02/25/2012 PCP: [name], MD  Brief narrative: Pt is 41 yo female with multiple and complex medical history including recurrent abdominal pain which was determined to be  Multifactorial in etiology and secondary to diabetic gastroparesis, narcotic use, chronic pancreatitis.   Active Problems:  Abdominal pain - this was determined to be multifactorial in etiology and secondary to narcotic bowl, per EGD esophagitis and possibly fungal, gastroparesis - will continue symptomatic management and appreciate GI input in pt's care - plan is to proceed with gastrostomy tube placement today 08/07 for enteral feeding - will continue to monitor   Hypokalemia - this was adequately supplemented - will obtain BMP in AM   Ileus - likely narcotic induced - pain clinic referral upon discharge   Hypomagnesemia - will check Mg level in AM   Diabetes mellitus, uncontrolled and with complications, gastroparesis - will check A1C  Consultants:  GI  Interventional radiology  PCCM - Dr. Melvyn Novas insertion of central venous catheter 03/06/2102  Procedures/Studies: Dg Chest Port 1 View 03/06/2012     No active cardiopulmonary disease.   EGD  03/06/2012  Esophagitis, recommendation is to continue PPI and diflucan   Antibiotics:  None  Code Status: Full Family Communication: Pt at bedside Disposition Plan: PT evaluation  HPI/Subjective: No events overnight.   Objective: Filed Vitals:   03/06/12 2109 03/06/12 2117 03/07/12 0700 03/07/12 0859  BP: 151/95  132/80   Pulse: 105 105 114   Temp: 98.3 F (36.8 C)  98.7 F (37.1 C)   TempSrc: Oral  Oral   Resp: 17 18 17    Height:      Weight:      SpO2: 98% 98% 94% 97%    Intake/Output Summary (Last 24 hours) at 03/07/12 1235 Last data filed at 03/07/12 1027  Gross per 24  hour  Intake 2201.67 ml  Output   2025 ml  Net 176.67 ml    Exam:   General:  Pt is alert, follows commands appropriately, not in acute distress  Cardiovascular: Regular rhythm, tachycardic, S1/S2, no murmurs, no rubs, no gallops  Respiratory: Clear to auscultation bilaterally, no wheezing, no crackles, no rhonchi  Abdomen: Soft, tender in epigastric area, non distended, bowel sounds present, no guarding  Extremities: No edema, pulses DP and PT palpable bilaterally  Neuro: Grossly nonfocal  Data Reviewed: Basic Metabolic Panel:  Lab [id number] 03/03/12 0540  NA 137 --  K 3.6 --  CL 104 --  CO2 28 --  GLUCOSE 111* --  BUN 3* --  CREATININE 0.47* 0.50  CALCIUM 8.2* --  MG -- --  PHOS -- --   CBG:  Lab 03/07/12 1135 03/07/12 0759 03/06/12 2041 03/06/12 1646 03/06/12 1142  GLUCAP 91 182* 134* 128* 176*    Scheduled Meds:   . enoxaparin injection  40 mg Subcutaneous Q24H  . erythromycin  250 mg Intravenous Q6H  . fentaNYL  25 mcg Transdermal Q72H  . fluconazole  100 mg Oral Daily  . Fluticasone-Salmeterol  1 puff Inhalation BID  . hydrALAZINE  10 mg Intravenous Q6H  . insulin aspart  0-9 Units Subcutaneous TID WC  . metoCLOPramide   10 mg Intravenous Q6H  . oxyCODONE  10 mg Oral Q12H  . pantoprazole IV  40 mg Intravenous BID  . potassium chloride  40 mEq Oral Once  .  scopolamine  1 patch Transdermal Q72H  . vancomycin  1,000 mg Intravenous Once   Continuous Infusions:   . dextrose 5 % and 0.9% NaCl 1,000 mL with potassium chloride 20 mEq infusion 100 mL/hr at 03/07/12 0701  . lactated ringers       [name], MD  [facility] Pager [phone]  If 7PM-7AM, please contact night-coverage [email or web address] Password [email or web address] 03/07/2012, 12:35 PM   LOS: 11 days

## 2012-03-08 ENCOUNTER — Encounter (HOSPITAL_COMMUNITY)
Admission: EM | Disposition: A | Discharge status: Home / Self Care | Payer: Self-pay | Source: Home / Self Care | Attending: Internal Medicine

## 2012-03-08 LAB — BASIC METABOLIC PANEL
CO2: 23 mEq/L (ref 19–32)
Calcium: 8.8 mg/dL (ref 8.4–10.5)
Chloride: 101 mEq/L (ref 96–112)
Potassium: 3.5 mEq/L (ref 3.5–5.1)
Sodium: 136 mEq/L (ref 135–145)

## 2012-03-08 LAB — CBC
Platelets: 249 10*3/uL (ref 150–400)
RBC: 3.85 MIL/uL — ABNORMAL LOW (ref 3.87–5.11)
WBC: 14.4 10*3/uL — ABNORMAL HIGH (ref 4.0–10.5)

## 2012-03-08 LAB — GLUCOSE, CAPILLARY
Glucose-Capillary: 137 mg/dL — ABNORMAL HIGH (ref 70–99)
Glucose-Capillary: 81 mg/dL (ref 70–99)
Glucose-Capillary: 89 mg/dL (ref 70–99)
Glucose-Capillary: 107 mg/dL — ABNORMAL HIGH (ref 70–99)

## 2012-03-08 SURGERY — PHYSICIAN CASE REQUEST
Anesthesia: Choice

## 2012-03-08 MED ORDER — DOCUSATE SODIUM 100 MG PO CAPS
100.0000 mg | ORAL_CAPSULE | Freq: Two times a day (BID) | ORAL | Status: DC
  Administered 2012-03-08 – 2012-03-14 (×11): 100 mg via ORAL
  Filled 2012-03-08 (×13): qty 1

## 2012-03-08 MED ORDER — BISACODYL 10 MG RE SUPP
10.0000 mg | Freq: Every day | RECTAL | Status: DC
  Administered 2012-03-09: 10 mg via RECTAL
  Filled 2012-03-08 (×2): qty 1

## 2012-03-08 MED ORDER — OXYCODONE HCL 15 MG PO TB12
15.0000 mg | ORAL_TABLET | Freq: Two times a day (BID) | ORAL | Status: DC
  Administered 2012-03-08 – 2012-03-14 (×10): 15 mg via ORAL
  Filled 2012-03-08 (×11): qty 1

## 2012-03-08 NOTE — Progress Notes (Signed)
[facility] PROGRESS NOTE Subjective Patient had successful placement of radiographic gastrostomy tube yesterday. Apparently arrangements were made for her to go back and have jejunostomy tube placed today under fluoroscopy. She is continuing to complain of pain which he says is no better or worse than previous pain. She remains afebrile. WBC is 14.  Objective: Vital signs in last 24 hours: Temp:  [97.9 F (36.6 C)-99.5 F (37.5 C)] 98.9 F (37.2 C) (08/08 0606) Pulse Rate:  [91-125] 110  (08/08 0606) Resp:  [13-20] 18  (08/08 0606) BP: (89-159)/(55-96) 145/85 mmHg (08/08 0606) SpO2:  [94 %-100 %] 100 % (08/08 0606) Last BM Date: 03/02/12  Intake/Output from previous day: 08/07 0701 - 08/08 0700 In: 2240 [I.V.:2040; IV Piggyback:200] Out: 1050 [Urine:1050] Intake/Output this shift:    PE: Gen.-patient complaining of pain once more pain medication says is no worse than she usually has Abdomen-nondistended and relatively quiet with diffuse tenderness.  Lab Results:  [facility] 03/08/12 0445  WBC 14.4*  HGB 11.4*  HCT 34.0*  PLT 249   BMET  Basename 03/08/12 0445  NA 136  K 3.5  CL 101  CO2 23  CREATININE 0.59   LFT No results found for this basename: PROT:3ALBUMIN:3,AST:3,ALT:3,ALKPHOS:3,BILITOT:3,BILIDIR:3,IBILI:3 in the last 72 hours PT/INR No results found for this basename: LABPROT:3,INR:3 in the last 72 hours PANCREAS No results found for this basename: LIPASE:3 in the last 72 hours       Studies/Results: Ir Gastrostomy Tube Mod Sed  03/07/2012  *RADIOLOGY REPORT*  Clinical Data:Recurrent gastroparesis, needs gastric decompression and enteral feeding support.  Tolerated gastrostomy tubes in the past.  PERC PLACEMENT GASTROSTOMY  Technique: The procedure, risks, benefits, and alternatives were explained to the patient.  Questions regarding the procedure were encouraged and answered.  The patient understands and consents to the procedure.  As  antibiotic prophylaxis, vancomycin 1 gram selected because of an allergy to penicillin was ordered pre-procedure and administered intravenously within one hour of incision.  Progression of previously administered oral barium into the colon was confirmed fluoroscopically. A 5 French angiographic catheter was placed as orogastric tube. The upper abdomen was prepped with Betadine, draped in usual sterile fashion, and infiltrated locally with 1% lidocaine.  Intravenous Fentanyl and Versed were administered as conscious sedation during continuous cardiorespiratory monitoring by the radiology RN, with a total moderate sedation time of 60 minutes.  Stomach was insufflated using air through the orogastric tube. 1 mg glucagon was given IV to facilitate gastric distention and decrease peristalsis.  An 75 French sheath needle was advanced percutaneously into the gastric lumen under fluoroscopy. Gas could be aspirated and a small contrast injection confirmed intraluminal spread. The sheath was exchanged over a guidewire for a 9 [place] vascular sheath, through which the snare device was advanced and used to snare a guidewire passed through the orogastric tube. This was withdrawn, and the snare attached to the 20 French pull-through gastrostomy tube, which was advanced antegrade, positioned with the internal bumper securing the anterior gastric wall to the anterior abdominal wall. Small contrast injection confirms appropriate positioning.  After the external bumper was applied, a 5-French angled angiographic catheter was advanced in attempts to negotiate the pylorus and gain access to the proximal jejunum for coaxial jejunostomy feeding tube placement.  The [place] was exchanged for a C2 catheter, and this exchanged for a long 7-French vascular sheath through which the catheters were coaxially advanced and used with the aid of a Benson and angled stiff hydrophilic glide wires in attempts  to negotiate the pylorus but ultimately,  because of a combination of residual gastric distention, poor peristalsis due to a earlier glucagon administration, and overlying colonic barium, the pylorus could not be traversed to gain adequate access for jejunostomy tube placement.  For this reason, the catheter was flushed and the gastrostomy adapter placed.  Contrast injection confirmed appropriate catheter positioning.  Catheter was secured externally. The patient tolerated the procedure well.  No immediate complication.  IMPRESSION: 1. Technically successful 20 French pull-through gastrostomy placement under fluoroscopy.  The patient can return in 1-2 days to allow coaxial jejunostomy tube placement under fluoroscopy.  Original Report Authenticated By: Trecia Rogers, M.D.   Dg Chest Port 1 View  03/06/2012  *RADIOLOGY REPORT*  Clinical Data: Central line placement.  PORTABLE CHEST - 1 VIEW  Comparison: None.  Findings: Heart and mediastinal contours are within normal limits. No focal opacities or effusions.  No acute bony abnormality. Left central line is in place with the tip in the SVC.  No pneumothorax.  IMPRESSION: No active cardiopulmonary disease.  Original Report Authenticated By: Raelyn Number, M.D.    Medications: I have reviewed the patient's current medications.  Assessment/Plan: 1. 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 Patient is just a gastrostomy tube placed but jejunostomy could not be threaded down due to residual contrast. This will be repeated in the near future. Would not begin tube feeding until jejunostomy tube adequately placed. Then began with a diabetic formula and low rate and slowly increase the rate. Patient may need to be placed rehabilitation Center for a while after discharge.   Kimala Horne JR,Samel Bruna L 03/08/2012, 9:03 AM

## 2012-03-08 NOTE — Progress Notes (Signed)
INITIAL ADULT NUTRITION ASSESSMENT Date: 03/08/2012   Time: 11:17 AM Reason for Assessment: Status changes  ASSESSMENT: [name]  Dx: Chronic abdominal pain, gastroparesis, and malnutirtion  Hx:  Past Medical History  Diagnosis Date  . Hypertension   . Diabetes mellitus   . Gastroparesis   . Asthma   . GERD (gastroesophageal reflux disease)   . Coronary artery disease   . h/o seizure   . Neuropathy   . Chronic pancreatitis   . Dyslipidemia   . MI (myocardial infarction)   . COPD (chronic obstructive pulmonary disease)     Related Meds:  Scheduled Meds:   . enoxaparin (LOVENOX) injection  40 mg Subcutaneous Q24H  . erythromycin  250 mg Intravenous Q6H  . fentaNYL  25 mcg Transdermal Q72H  . fluconazole  100 mg Oral Daily  . Fluticasone-Salmeterol  1 puff Inhalation BID  . glucagon      . glucagon  1 mg Intravenous Once  . hydrALAZINE  10 mg Intravenous Q6H  . HYDROmorphone      . insulin aspart  0-9 Units Subcutaneous TID WC  . lidocaine      . metoCLOPramide (REGLAN) injection  10 mg Intravenous Q6H  . oxyCODONE  10 mg Oral Q12H  . pantoprazole (PROTONIX) IV  40 mg Intravenous BID  . potassium chloride  40 mEq Oral Once  . scopolamine  1 patch Transdermal Q72H  . sodium chloride  3 mL Intravenous Q12H  . vancomycin      . vancomycin  1,000 mg Intravenous Once   Continuous Infusions:   . dextrose 5 % and 0.9% NaCl 1,000 mL with potassium chloride 20 mEq infusion 100 mL/hr at 03/08/12 0014  . lactated ringers     PRN Meds:.acetaminophen, acetaminophen, alum & mag hydroxide-simeth, docusate sodium, fentaNYL, HYDROmorphone (DILAUDID) injection, iohexol, magic mouthwash w/lidocaine, midazolam, promethazine, sodium chloride, zolpidem, DISCONTD:  HYDROmorphone (DILAUDID) injection, DISCONTD:  HYDROmorphone (DILAUDID) injection   Ht: 5\' 4"  (162.6 cm)  Wt: 125 lb 14.1 oz (57.1 kg)  Ideal Wt: 54.7 kg  % Ideal Wt: 104% Wt Readings from Last 10 Encounters:    02/29/12 125 lb 14.1 oz (57.1 kg)  02/29/12 125 lb 14.1 oz (57.1 kg)  02/29/12 125 lb 14.1 oz (57.1 kg)  02/29/12 125 lb 14.1 oz (57.1 kg)  02/29/12 125 lb 14.1 oz (57.1 kg)  02/29/12 125 lb 14.1 oz (57.1 kg)  02/12/12 131 lb 6.3 oz (59.6 kg)  12/01/11 139 lb 12.4 oz (63.4 kg)  08/09/11 133 lb 12.8 oz (60.691 kg)  06/12/11 134 lb 11.2 oz (61.1 kg)    Usual Wt: 147 lb per pt was the highest her weight has gotten % Usual Wt: 85%  Body mass index is 21.61 kg/(m^2). (WNL)  Food/Nutrition Related Hx: Patient reported her appetite has been good, she feels hungry. Pt s/p gastrostomy tube placement. Per MD note, pt awaiting j-tube placement. Per MD hold feedings until j-tube placed. Once placed MD request diabetic formula initiation at a slow rate and increase slowly to goal.   Labs:  CMP     Component Value Date/Time   NA 136 03/08/2012 0445   K 3.5 03/08/2012 0445   CL 101 03/08/2012 0445   CO2 23 03/08/2012 0445   GLUCOSE 158* 03/08/2012 0445   BUN 5* 03/08/2012 0445   CREATININE 0.59 03/08/2012 0445   CALCIUM 8.8 03/08/2012 0445   PROT 7.3 02/25/2012 1751   ALBUMIN 3.1* 02/25/2012 1751   AST 23 02/25/2012  1751   ALT 9 02/25/2012 1751   ALKPHOS 102 02/25/2012 1751   BILITOT 0.2* 02/25/2012 1751   GFRNONAA >90 03/08/2012 0445   GFRAA >90 03/08/2012 0445     Intake/Output Summary (Last 24 hours) at 03/08/12 1119 Last data filed at 03/08/12 0700  Gross per 24 hour  Intake   2230 ml  Output   1050 ml  Net   1180 ml    Diet Order: NPO  Supplements/Tube Feeding: none at this time  IVF:    dextrose 5 % and 0.9% NaCl 1,000 mL with potassium chloride 20 mEq infusion Last Rate: 100 mL/hr at 03/08/12 0014  lactated ringers     Estimated Nutritional Needs:   Kcal: 1420-1700 Protein: 60-75 grams Fluid: 1 ml per kcal intake  NUTRITION DIAGNOSIS: -Inadequate oral intake (NI-2.1).  Status: Ongoing  RELATED TO: inability to eat  AS EVIDENCE BY: NPO  status  MONITORING/EVALUATION(Goals): J-tube placement, Initiation of TF with positive tolerance, weights, labs 1. Recommend initiation of TF as medically able to meet > 90% of estimated energy needs.   EDUCATION NEEDS: -No education needs identified at this time  INTERVENTION: 1. Recommend initiation on TF as medically able once J-tube placed. Will monitor. Recommend initiation of glucerna @ 10 ml/hr once able. Plan to start slow and increase rate slowly per MD.  2. RD to follow for nutrition plan of care.   Dietitian [phone]  [place] Per approved criteria  -Not Applicable    Loyce Dys [facility] 03/08/2012, 11:17 AM

## 2012-03-08 NOTE — Progress Notes (Addendum)
Patient ID: [name], female   DOB: 06/01/1971, 41 y.o.   MRN: [id number]  [facility] PROGRESS NOTE  [name] [id number] DOB: 08-12-1970 DOA: 02/25/2012 PCP: [name], MD  Brief narrative:  Pt is 41 yo female with multiple and complex medical history including recurrent abdominal pain which was determined to be Multifactorial in etiology and secondary to diabetic gastroparesis, narcotic use, chronic pancreatitis.   Active Problems:  Abdominal pain  - this was determined to be multifactorial in etiology and secondary to narcotic bowl, per EGD esophagitis and possibly fungal, gastroparesis  - will continue symptomatic management and appreciate GI input in pt's care  - pt denies any bowel movement since last Friday - will change bowel regimen to shceduled - plan is to proceed with jejunostomy tube placed potentially in AM - will continue to monitor   Hypokalemia  - this was adequately supplemented  - will obtain BMP in AM   Ileus  - likely narcotic induced  - pain clinic referral upon discharge as they do not see pts in the hospital - discussed in detail the pain regimen as pt is constantly asking for increase in dose and frequency - plan is to taper off IV medication regimen over the next 1-2 days - encouraged ambulation as pt has been refusing to participate in PT - OOB to chair and ambulating in the hall - pt has demonstrated that she is able to ambulate on her own - will monitor progress  Hypomagnesemia  - will check Mg level in AM   Diabetes mellitus, uncontrolled and with complications, gastroparesis  - will check A1C   Consultants:  GI  Interventional radiology  PCCM - Dr. Melvyn Novas insertion of central venous catheter 03/06/2102  Procedures/Studies:  Dg Chest Port 1 View  03/06/2012  No active cardiopulmonary disease.  EGD  03/06/2012  Esophagitis, recommendation is to continue PPI and diflucan   Gastrostomy tube place by Dr. Vernard Gambles  03/07/2012  Antibiotics:  None  Code Status: Full  Family Communication: Pt and family at bedside  Disposition Plan: Home when medically stable, pt refuses to be placed to SNF and wants hospital bed at home, encouraged ambulation  HPI/Subjective: No events overnight.   Objective: Filed Vitals:   03/08/12 0357 03/08/12 0606 03/08/12 1009 03/08/12 1512  BP: 144/91 145/85 153/88 165/95  Pulse: 109 110  114  Temp:  98.9 F (37.2 C)  99.2 F (37.3 C)  TempSrc:  Oral  Oral  Resp:  18  20  Height:      Weight:      SpO2:  100%  96%    Intake/Output Summary (Last 24 hours) at 03/08/12 1602 Last data filed at 03/08/12 1300  Gross per 24 hour  Intake   1400 ml  Output   1400 ml  Net      0 ml    Exam:   General:  Pt is alert, follows commands appropriately, not in acute distress  Cardiovascular: Regular rhythm, tachycardia, S1/S2, no murmurs, no rubs, no gallops  Respiratory: Clear to auscultation bilaterally, no wheezing, no crackles, no rhonchi  Abdomen: Soft, tender in epigastric area, non distended, bowel sounds present, no guarding  Extremities: No edema, pulses DP and PT palpable bilaterally  Neuro: Grossly nonfocal  Data Reviewed: Basic Metabolic Panel:  Lab [id number] 0445 03/03/12 1036 03/03/12 0540  NA 136 137 --  K 3.5 3.6 --  CL 101 104 --  CO2 23 28 --  GLUCOSE 158* 111* --  BUN 5* 3* --  CREATININE 0.59 0.47* 0.50  CALCIUM 8.8 8.2* --  MG -- -- --  PHOS -- -- --   CBC:  Lab 03/08/12 0445  WBC 14.4*  NEUTROABS --  HGB 11.4*  HCT 34.0*  MCV 88.3  PLT 249   CBG:  Lab 03/08/12 1136 03/08/12 0742 03/07/12 2049 03/07/12 1756 03/07/12 1135  GLUCAP 81 137* 144* 132* 91    Scheduled Meds:   . enoxaparin injection  40 mg Subcutaneous Q24H  . erythromycin  250 mg Intravenous Q6H  . fentaNYL  25 mcg Transdermal Q72H  . fluconazole  100 mg Oral Daily  . Fluticasone-Salmeterol  1 puff Inhalation BID  . hydrALAZINE  10 mg Intravenous Q6H  .  HYDROmorphone      . insulin aspart  0-9 Units Subcutaneous TID WC  . metoCLOPramide inj  10 mg Intravenous Q6H  . oxyCODONE  10 mg Oral Q12H  . pantoprazole  IV  40 mg Intravenous BID  . potassium chloride  40 mEq Oral Once  . scopolamine  1 patch Transdermal Q72H   Continuous Infusions:   . dextrose 5 % and 0.9% NaCl 1,000 mL with potassium chloride 20 mEq infusion 100 mL/hr at 03/08/12 0014  . lactated ringers       [name], MD  [facility] Pager [phone]  If 7PM-7AM, please contact night-coverage [email or web address] Password [email or web address] 03/08/2012, 4:02 PM   LOS: 12 days

## 2012-03-08 NOTE — Evaluation (Signed)
Physical Therapy Evaluation Patient Details Name: [name] MRN: [id number] DOB: [birth year] Today's Date: 03/08/2012 Time: [id number] PT Time Calculation (min): 14 min  PT Assessment / Plan / Recommendation Clinical Impression  Pt admitted for chronic abdominal pain and gastroparesis and currently s/p gastrostomy tube yesterday.  Pt with limited mobility at this time due to increased pain despite being premedicated.  Pt would benefit from acute PT services in order to improve independence with transfers and ambulation for safe d/c home with friend.  Per staff pt is declining SNF at this time.  Due to pt age and PLOF, pt will likely progress mobility well once pain under control.    PT Assessment  Patient needs continued PT services    Follow Up Recommendations  No PT follow up;Supervision for mobility/OOB    Barriers to Discharge        Equipment Recommendations  Rolling walker with 5" wheels    Recommendations for Other Services     Frequency Min 3X/week    Precautions / Restrictions Precautions Precautions: Fall Precaution Comments: abdominal corset   Pertinent Vitals/Pain 10/10 abdominal pain, premedicated, not relieved with repositioning in recliner, RN notified.      Mobility  Bed Mobility Bed Mobility: Rolling Left;Left Sidelying to Sit;Sit to Supine Rolling Left: 5: Supervision;With rail Left Sidelying to Sit: 4: Min assist;With rails Sit to Supine: 3: Mod assist Details for Bed Mobility Assistance: assist for trunk to rise, assist for LEs onto bed Transfers Transfers: Stand to Sit;Sit to Stand;Stand Pivot Transfers Sit to Stand: 4: Min assist;From chair/3-in-1;From bed Stand to Sit: 4: Min assist;To chair/3-in-1;To bed Stand Pivot Transfers: 4: Min assist Details for Transfer Assistance: assist likely due to pain, verbal cues for safe technique, used RW    Exercises     PT Diagnosis: Difficulty walking;Acute pain  PT Problem List: Decreased activity  tolerance;Decreased mobility;Pain;Decreased knowledge of use of DME PT Treatment Interventions: DME instruction;Gait training;Functional mobility training;Therapeutic activities;Therapeutic exercise;Patient/family education   PT Goals    Visit Information  Last PT Received On: 03/08/12 Assistance Needed: +1    Subjective Data  Subjective: It hurts so bad.   Prior Functioning  Home Living Lives With: Friend(s) Home Access: Level entry Home Layout: One level Home Adaptive Equipment: None Additional Comments: Pt barely able to answer questions due to pain. Prior Function Level of Independence: Independent Communication Communication: No difficulties    Cognition  Overall Cognitive Status: Appears within functional limits for tasks assessed/performed    Extremity/Trunk Assessment Right Upper Extremity Assessment RUE ROM/Strength/Tone: [facility] for tasks assessed Left Upper Extremity Assessment LUE ROM/Strength/Tone: WFL for tasks assessed Right Lower Extremity Assessment RLE ROM/Strength/Tone: [facility] for tasks assessed Left Lower Extremity Assessment LLE ROM/Strength/Tone: WFL for tasks assessed   Balance    End of Session PT - End of Session Activity Tolerance: Patient limited by pain Patient left: in bed;with call bell/phone within reach;with family/visitor present Nurse Communication: Mobility status;Patient requests pain meds  GP     Laquinta Hazell,KATHrine E 03/08/2012, 2:33 PM Pager: [phone]

## 2012-03-09 ENCOUNTER — Inpatient Hospital Stay (HOSPITAL_COMMUNITY): Payer: PRIVATE HEALTH INSURANCE

## 2012-03-09 LAB — GLUCOSE, CAPILLARY
Glucose-Capillary: 145 mg/dL — ABNORMAL HIGH (ref 70–99)
Glucose-Capillary: 98 mg/dL (ref 70–99)
Glucose-Capillary: 98 mg/dL (ref 70–99)
Glucose-Capillary: 105 mg/dL — ABNORMAL HIGH (ref 70–99)

## 2012-03-09 LAB — BASIC METABOLIC PANEL
CO2: 24 mEq/L (ref 19–32)
Calcium: 8.3 mg/dL — ABNORMAL LOW (ref 8.4–10.5)
Creatinine, Ser: 0.49 mg/dL — ABNORMAL LOW (ref 0.50–1.10)
Glucose, Bld: 182 mg/dL — ABNORMAL HIGH (ref 70–99)

## 2012-03-09 LAB — CBC
MCH: 29.3 pg (ref 26.0–34.0)
MCV: 88.1 fL (ref 78.0–100.0)
Platelets: 201 10*3/uL (ref 150–400)
RBC: 3.28 MIL/uL — ABNORMAL LOW (ref 3.87–5.11)

## 2012-03-09 MED ORDER — POTASSIUM CHLORIDE CRYS ER 20 MEQ PO TBCR
40.0000 meq | EXTENDED_RELEASE_TABLET | Freq: Once | ORAL | Status: AC
  Administered 2012-03-09: 40 meq via ORAL
  Filled 2012-03-09: qty 2

## 2012-03-09 MED ORDER — IOHEXOL 300 MG/ML  SOLN: 40.0000 mL | Freq: Once | INTRAMUSCULAR | Status: AC | PRN

## 2012-03-09 NOTE — Progress Notes (Signed)
Patient ID: [name], female   DOB: Jun 07, 1971, 41 y.o.   MRN: [id number]  [facility] PROGRESS NOTE  [name] [id number] DOB: [birth year] DOA: 02/25/2012 PCP: [name], MD  Brief narrative:  Pt is 41 yo female with multiple and complex medical history including recurrent abdominal pain which was determined to be Multifactorial in etiology and secondary to diabetic gastroparesis, narcotic use, chronic pancreatitis.   Active Problems:  Abdominal pain  - this was determined to be multifactorial in etiology and secondary to narcotic bowl, per EGD esophagitis and possibly fungal, gastroparesis  - will continue symptomatic management and appreciate GI input in pt's care  - bowel movement today - s/p jejunostomy tube placed today 03/09/2012 - will continue to monitor   Hypokalemia  - will supplement - will obtain BMP in AM   Ileus  - likely narcotic induced  - pain clinic referral upon discharge as they do not see pts in the hospital  - discussed in detail the pain regimen as pt is constantly asking for increase in dose and frequency  - plan is to taper off IV medication regimen over the next 1-2 days  - encouraged ambulation as pt has been refusing to participate in PT  - OOB to chair and ambulating in the hall  - pt has demonstrated that she is able to ambulate on her own  - will monitor progress   Hypomagnesemia  - will check Mg level in AM   Diabetes mellitus, uncontrolled and with complications, gastroparesis  - will check A1C   Consultants:  GI  Interventional radiology  PCCM - Dr. Melvyn Novas insertion of central venous catheter 03/06/2102  Procedures/Studies:  Dg Chest Port 1 View  03/06/2012  No active cardiopulmonary disease.   EGD  03/06/2012  Esophagitis, recommendation is to continue PPI and diflucan   Gastrostomy tube place by Dr. Vernard Gambles 03/07/2012  Jejunostomy tube 03/09/2012  Antibiotics:  None Code Status: Full  Family Communication: Pt  and family at bedside  Disposition Plan: Home when medically stable, pt refuses to be placed to SNF and wants hospital bed at home, encouraged ambulation    HPI/Subjective: No events overnight.   Objective: Filed Vitals:   03/09/12 1445 03/09/12 1515 03/09/12 1530 03/09/12 1612  BP: 119/74 122/84 152/88 152/88  Pulse: 114 110 113   Temp: 98.9 F (37.2 C) 98.9 F (37.2 C) 98.8 F (37.1 C)   TempSrc: Oral Oral Oral   Resp: 16 16 16    Height:      Weight:      SpO2: 95% 94% 95%     Intake/Output Summary (Last 24 hours) at 03/09/12 1623 Last data filed at 03/09/12 0700  Gross per 24 hour  Intake   1400 ml  Output   1500 ml  Net   -100 ml    Exam:   General:  Pt is alert, follows commands appropriately, not in acute distress  Cardiovascular: Regular rhythm, tachycardic, S1/S2, no murmurs, no rubs, no gallops  Respiratory: Clear to auscultation bilaterally, no wheezing, no crackles, no rhonchi  Abdomen: Soft, tender in epigastric area, non distended, bowel sounds present, no guarding  Extremities: No edema, pulses DP and PT palpable bilaterally  Neuro: Grossly nonfocal  Data Reviewed: Basic Metabolic Panel:  Lab [id number] 03/08/12 0445 03/03/12 1036 03/03/12 0540  NA 136 136 137 --  K 3.1* 3.5 3.6 --  CL 101 101 104 --  CO2 24 23 28  --  GLUCOSE 182* 158* 111* --  BUN 4* 5* 3* --  CREATININE 0.49* 0.59 0.47* 0.50  CALCIUM 8.3* 8.8 8.2* --  MG -- -- -- --  PHOS -- -- -- --   CBC:  Lab 03/09/12 0422 03/08/12 0445  WBC 6.2 14.4*  NEUTROABS -- --  HGB 9.6* 11.4*  HCT 28.9* 34.0*  MCV 88.1 88.3  PLT 201 249   CBG:  Lab 03/09/12 1149 03/09/12 0810 03/08/12 2115 03/08/12 1700 03/08/12 1136  GLUCAP 98 145* 107* 89 81    No results found for this or any previous visit (from the past 240 hour(s)).   Scheduled Meds:   . bisacodyl  10 mg Rectal Daily  . docusate sodium  100 mg Oral BID  . enoxaparin (LOVENOX) injection  40 mg Subcutaneous Q24H  .  erythromycin  250 mg Intravenous Q6H  . fentaNYL  25 mcg Transdermal Q72H  . fluconazole  100 mg Oral Daily  . Fluticasone-Salmeterol  1 puff Inhalation BID  . hydrALAZINE  10 mg Intravenous Q6H  . insulin aspart  0-9 Units Subcutaneous TID WC  . metoCLOPramide (REGLAN) injection  10 mg Intravenous Q6H  . oxyCODONE  15 mg Oral Q12H  . pantoprazole (PROTONIX) IV  40 mg Intravenous BID  . potassium chloride  40 mEq Oral Once  . scopolamine  1 patch Transdermal Q72H  . sodium chloride  3 mL Intravenous Q12H   Continuous Infusions:   . dextrose 5 % and 0.9% NaCl 1,000 mL with potassium chloride 20 mEq infusion 100 mL/hr at 03/09/12 1445  . lactated ringers       [name], MD  [facility] Pager [phone]  If 7PM-7AM, please contact night-coverage [email or web address] Password [email or web address] 03/09/2012, 4:23 PM   LOS: 13 days

## 2012-03-09 NOTE — Progress Notes (Signed)
[facility] PROGRESS NOTE Subjective Patient feeling better today is had some pain medicine. She reports her pain is so severe and frequent that she cannot get by without pain medications. She is going today to have her gastrostomy tube converted to feeding jejunostomy. She indicates to me that she is going to live with her sister after discharge.  Objective: Vital signs in last 24 hours: Temp:  [98.3 F (36.8 C)-99.2 F (37.3 C)] 98.3 F (36.8 C) (08/09 0554) Pulse Rate:  [105-116] 114  (08/09 0554) Resp:  [17-20] 17  (08/09 0554) BP: (117-165)/(76-95) 120/76 mmHg (08/09 0554) SpO2:  [94 %-97 %] 97 % (08/09 0554) Last BM Date: 03/02/12  Intake/Output from previous day: 08/08 0701 - 08/09 0700 In: 1400 [I.V.:1200; IV Piggyback:200] Out: 1850 [Urine:1750; Drains:100] Intake/Output this shift:    PE: Patient is alert and abdominal exam is unchanged  Lab Results:  Basename 03/09/12 0422 03/08/12 0445  WBC 6.2 14.4*  HGB 9.6* 11.4*  HCT 28.9* 34.0*  PLT 201 249   BMET  Basename 03/09/12 0422 03/08/12 0445  NA 136 136  K 3.1* 3.5  CL 101 101  CO2 24 23  CREATININE 0.49* 0.59   LFT No results found for this basename: PROT:3ALBUMIN:3,AST:3,ALT:3,ALKPHOS:3,BILITOT:3,BILIDIR:3,IBILI:3 in the last 72 hours PT/INR No results found for this basename: LABPROT:3,INR:3 in the last 72 hours PANCREAS No results found for this basename: LIPASE:3 in the last 72 hours       Studies/Results: Ir Gastrostomy Tube Mod Sed  03/07/2012  *RADIOLOGY REPORT*  Clinical Data:Recurrent gastroparesis, needs gastric decompression and enteral feeding support.  Tolerated gastrostomy tubes in the past.  PERC PLACEMENT GASTROSTOMY  Technique: The procedure, risks, benefits, and alternatives were explained to the patient.  Questions regarding the procedure were encouraged and answered.  The patient understands and consents to the procedure.  As antibiotic prophylaxis, vancomycin 1 gram  selected because of an allergy to penicillin was ordered pre-procedure and administered intravenously within one hour of incision.  Progression of previously administered oral barium into the colon was confirmed fluoroscopically. A 5 French angiographic catheter was placed as orogastric tube. The upper abdomen was prepped with Betadine, draped in usual sterile fashion, and infiltrated locally with 1% lidocaine.  Intravenous Fentanyl and Versed were administered as conscious sedation during continuous cardiorespiratory monitoring by the radiology RN, with a total moderate sedation time of 60 minutes.  Stomach was insufflated using air through the orogastric tube. 1 mg glucagon was given IV to facilitate gastric distention and decrease peristalsis.  An 65 French sheath needle was advanced percutaneously into the gastric lumen under fluoroscopy. Gas could be aspirated and a small contrast injection confirmed intraluminal spread. The sheath was exchanged over a guidewire for a 9 [place] vascular sheath, through which the snare device was advanced and used to snare a guidewire passed through the orogastric tube. This was withdrawn, and the snare attached to the 20 French pull-through gastrostomy tube, which was advanced antegrade, positioned with the internal bumper securing the anterior gastric wall to the anterior abdominal wall. Small contrast injection confirms appropriate positioning.  After the external bumper was applied, a 5-French angled angiographic catheter was advanced in attempts to negotiate the pylorus and gain access to the proximal jejunum for coaxial jejunostomy feeding tube placement.  The Kumpe was exchanged for a C2 catheter, and this exchanged for a long 7-French vascular sheath through which the catheters were coaxially advanced and used with the aid of a Benson and angled stiff hydrophilic glide  wires in attempts to negotiate the pylorus but ultimately, because of a combination of residual gastric  distention, poor peristalsis due to a earlier glucagon administration, and overlying colonic barium, the pylorus could not be traversed to gain adequate access for jejunostomy tube placement.  For this reason, the catheter was flushed and the gastrostomy adapter placed.  Contrast injection confirmed appropriate catheter positioning.  Catheter was secured externally. The patient tolerated the procedure well.  No immediate complication.  IMPRESSION: 1. Technically successful 20 French pull-through gastrostomy placement under fluoroscopy.  The patient can return in 1-2 days to allow coaxial jejunostomy tube placement under fluoroscopy.  Original Report Authenticated By: Trecia Rogers, M.D.    Medications: I have reviewed the patient's current medications.  Assessment/Plan: 1. Chronic abdominal pain/gastroparesis/chronic pancreatitis/narcotic bowel syndrome. The patient to have feeding jejunostomy placed today. I would go ahead and start her slowly on enteral feedings. Hopefully her pain medications can be changed to a liquid form given to the gastrostomy tube. Would begin arrangements for tube feeding at home. She has done this before and is planning to go live with her sister.   [name] JR,Martha Linskey [name] 03/09/2012, 9:29 AM

## 2012-03-09 NOTE — Procedures (Signed)
Interventional Radiology Procedure Note  Procedure: Successful placement of a Jejunal feeding tube coaxially through the existing G tube.  The J tube tip is in the proximal jejunum.  Complications: None Recommendations: - May begin jejunal feeds now - Use G portion for decompression, PRN  Signed,  Criselda Peaches, MD Vascular & Interventional Radiologist [facility] Radiology

## 2012-03-10 LAB — GLUCOSE, CAPILLARY
Glucose-Capillary: 127 mg/dL — ABNORMAL HIGH (ref 70–99)
Glucose-Capillary: 119 mg/dL — ABNORMAL HIGH (ref 70–99)
Glucose-Capillary: 107 mg/dL — ABNORMAL HIGH (ref 70–99)
Glucose-Capillary: 97 mg/dL (ref 70–99)

## 2012-03-10 LAB — CBC
Hemoglobin: 8.7 g/dL — ABNORMAL LOW (ref 12.0–15.0)
MCH: 30.1 pg (ref 26.0–34.0)
MCHC: 34 g/dL (ref 30.0–36.0)
Platelets: 194 10*3/uL (ref 150–400)

## 2012-03-10 LAB — BASIC METABOLIC PANEL
Calcium: 7.8 mg/dL — ABNORMAL LOW (ref 8.4–10.5)
Creatinine, Ser: 0.52 mg/dL (ref 0.50–1.10)
GFR calc non Af Amer: >90 mL/min (ref >90)
Glucose, Bld: 110 mg/dL — ABNORMAL HIGH (ref 70–99)
Sodium: 135 mEq/L (ref 135–145)

## 2012-03-10 MED ORDER — GLUCERNA 1.2 CAL PO LIQD
1000.0000 mL | ORAL | Status: DC
  Administered 2012-03-10: 1000 mL
  Filled 2012-03-10 (×4): qty 1000

## 2012-03-10 NOTE — Progress Notes (Signed)
Patient ID: [name], female   DOB: 22-Oct-1970, 41 y.o.   MRN: [id number]  [facility] PROGRESS NOTE  [name] [id number] DOB: 1971-02-12 DOA: 02/25/2012 PCP: [name], MD  Brief narrative:  Pt is 41 yo female with multiple and complex medical history including recurrent abdominal pain which was determined to be Multifactorial in etiology and secondary to diabetic gastroparesis, narcotic use, chronic pancreatitis.   Active Problems:   Abdominal pain  - this was determined to be multifactorial in etiology and secondary to narcotic bowl, per EGD esophagitis and possibly fungal, gastroparesis  - will continue symptomatic management and appreciate GI input in pt's care  - bowel movement today  - s/p jejunostomy tube placed 03/09/2012, successful - advance tube feeds  - will continue to monitor    Hypokalemia  - will supplement as indicated - K is within normal limits today - will obtain BMP in AM    Ileus  - likely narcotic induced  - pain clinic referral upon discharge as they do not see pts in the hospital  - discussed in detail the pain regimen as pt is constantly asking for increase in dose and frequency  - plan is to taper off IV medication regimen over the next 1-2 days  - encouraged ambulation as pt has been refusing to participate in PT  - OOB to chair and ambulating in the hall  - pt has demonstrated that she is able to ambulate on her own  - will monitor progress    Hypomagnesemia  - will check Mg level in AM    Diabetes mellitus, uncontrolled and with complications, gastroparesis  - will continue Insulin    Acute blood loss anemia - post op with dilutional component - will obtain CBC in AM  Consultants:  GI --> signed off 03/10/2012 Interventional radiology  PCCM - Dr. Melvyn Novas insertion of central venous catheter 03/06/2102  Procedures/Studies:  Dg Chest Port 1 View  03/06/2012  No active cardiopulmonary disease.   EGD  03/06/2012    Esophagitis, recommendation is to continue PPI and diflucan  Gastrostomy tube place by Dr. Vernard Gambles 03/07/2012  Jejunostomy tube 03/09/2012   Antibiotics:  None  Code Status: Full  Family Communication: Pt and family at bedside  Disposition Plan: Home when medically stable, pt refuses to be placed to SNF and wants hospital bed at home, encouraged ambulation    HPI/Subjective: No events overnight.   Objective: Filed Vitals:   03/09/12 2136 03/10/12 0413 03/10/12 0740 03/10/12 0757  BP: 152/89 116/73 133/82   Pulse: 120 103 112   Temp: 99 F (37.2 C)  99.5 F (37.5 C)   TempSrc: Oral  Oral   Resp: 17  18   Height:      Weight:      SpO2: 98%  96% 95%    Intake/Output Summary (Last 24 hours) at 03/10/12 [id number] Last data filed at 03/10/12 0700  Gross per 24 hour  Intake   2800 ml  Output    600 ml  Net   2200 ml    Exam:   General:  Pt is alert, follows commands appropriately, not in acute distress  Cardiovascular: Regular rhythm, tachycardia, S1/S2, no murmurs, no rubs, no gallops  Respiratory: Clear to auscultation bilaterally, no wheezing, no crackles, no rhonchi  Abdomen: Soft, non tender, non distended, bowel sounds present, no guarding  Extremities: No edema, pulses DP and PT palpable bilaterally  Neuro: Grossly nonfocal  Data Reviewed: Basic Metabolic Panel:  Lab 03/10/12 0455 03/09/12 0422 03/08/12 0445 03/03/12 1036  NA 135 136 136 137  K 3.5 3.1* 3.5 3.6  CL 103 101 101 104  CO2 25 24 23 28   GLUCOSE 110* 182* 158* 111*  BUN 3* 4* 5* 3*  CREATININE 0.52 0.49* 0.59 0.47*  CALCIUM 7.8* 8.3* 8.8 8.2*  MG -- -- -- --  PHOS -- -- -- --   CBC:  Lab 03/10/12 0455 03/09/12 0422 03/08/12 0445  WBC 6.8 6.2 14.4*  NEUTROABS -- -- --  HGB 8.7* 9.6* 11.4*  HCT 25.6* 28.9* 34.0*  MCV 88.6 88.1 88.3  PLT 194 201 249   CBG:  Lab 03/10/12 0729 03/09/12 2159 03/09/12 1647 03/09/12 1149 03/09/12 0810  GLUCAP 127* 105* 98 98 145*     Scheduled  Meds:   . docusate sodium  100 mg Oral BID  . enoxaparin (LOVENOX) injection  40 mg Subcutaneous Q24H  . erythromycin  250 mg Intravenous Q6H  . fentaNYL  25 mcg Transdermal Q72H  . fluconazole  100 mg Oral Daily  . Fluticasone-Salmeterol  1 puff Inhalation BID  . hydrALAZINE  10 mg Intravenous Q6H  . insulin aspart  0-9 Units Subcutaneous TID WC  . metoCLOPramide (REGLAN) injection  10 mg Intravenous Q6H  . oxyCODONE  15 mg Oral Q12H  . pantoprazole (PROTONIX) IV  40 mg Intravenous BID  . potassium chloride  40 mEq Oral Once  . potassium chloride  40 mEq Oral Once  . scopolamine  1 patch Transdermal Q72H  . sodium chloride  3 mL Intravenous Q12H  . DISCONTD: bisacodyl  10 mg Rectal Daily   Continuous Infusions:   . dextrose 5 % and 0.9% NaCl 1,000 mL with potassium chloride 20 mEq infusion 100 mL/hr at 03/10/12 0112  . lactated ringers       [name], MD  [facility] Pager [phone]  If 7PM-7AM, please contact night-coverage [email or web address] Password [email or web address] 03/10/2012, 9:03 AM   LOS: 14 days

## 2012-03-10 NOTE — Progress Notes (Signed)
Brief Nutrition Note  Consult received for enteral/tube feeding initiation and management.  J-tube placed yesterday, order received for RD to initiate and advance tube feed. Full assessment completed on 8/8 by [name], RD. Note, start diabetic formula slowly and increase slowly per MD.   Estimated Nutritional Needs:  1420-1700 kcal 65-80 g protein  Intervention:  1. Adult Enteral Nutrition Protocol initiated. Initiate Glucerna 1.2 via J-tube at 10 ml/hr. Increase rate by 10 ml/hr every 8 hours as tolerated to goal rate of 55 ml/hr. This will provide 1584 kcal, 79 g protein, and 1063 ml free water.  2. RD to monitor TF advancement and tolerance.

## 2012-03-10 NOTE — Progress Notes (Signed)
Patient ID: [name], female   DOB: 1971/06/02, 41 y.o.   MRN: [id number] [facility] Gastroenterology Progress Note  Subjective: No new complaints  Objective: Vital signs in last 24 hours: Temp:  [98.8 F (37.1 [name])-99.5 F (37.5 [name])] 99.5 F (37.5 [name]) (08/10 0740) Pulse Rate:  [103-120] 112  (08/10 0740) Resp:  [16-18] 18  (08/10 0740) BP: (116-152)/(73-89) 139/85 mmHg (08/10 0956) SpO2:  [94 %-98 %] 95 % (08/10 0757) Weight change:    PE: PEG/PEJ tube intact  Lab Results: Results for orders placed during the hospital encounter of 02/25/12 (from the past 24 hour(s))  GLUCOSE, CAPILLARY     Status: Normal   Collection Time   03/09/12 11:49 AM      Component Value Range   Glucose-Capillary 98  70 - 99 mg/dL   Comment 1 Notify RN    GLUCOSE, CAPILLARY     Status: Normal   Collection Time   03/09/12  4:47 PM      Component Value Range   Glucose-Capillary 98  70 - 99 mg/dL   Comment 1 Documented in Chart     Comment 2 Notify RN    GLUCOSE, CAPILLARY     Status: Abnormal   Collection Time   03/09/12  9:59 PM      Component Value Range   Glucose-Capillary 105 (*) 70 - 99 mg/dL   Comment 1 Documented in Chart     Comment 2 Notify RN    CBC     Status: Abnormal   Collection Time   03/10/12  4:55 AM      Component Value Range   WBC 6.8  4.0 - 10.5 K/uL   RBC 2.89 (*) 3.87 - 5.11 MIL/uL   Hemoglobin 8.7 (*) 12.0 - 15.0 g/dL   HCT 25.6 (*) 36.0 - 46.0 %   MCV 88.6  78.0 - 100.0 fL   MCH 30.1  26.0 - 34.0 pg   MCHC 34.0  30.0 - 36.0 g/dL   RDW 13.8  11.5 - 15.5 %   Platelets 194  150 - 400 K/uL  BASIC METABOLIC PANEL     Status: Abnormal   Collection Time   03/10/12  4:55 AM      Component Value Range   Sodium 135  135 - 145 mEq/L   Potassium 3.5  3.5 - 5.1 mEq/L   Chloride 103  96 - 112 mEq/L   CO2 25  19 - 32 mEq/L   Glucose, Bld 110 (*) 70 - 99 mg/dL   BUN 3 (*) 6 - 23 mg/dL   Creatinine, Ser 0.52  0.50 - 1.10 mg/dL   Calcium 7.8 (*) 8.4 - 10.5 mg/dL   GFR calc non Af  Amer >90  >90 mL/min   GFR calc Af Amer >90  >90 mL/min  GLUCOSE, CAPILLARY     Status: Abnormal   Collection Time   03/10/12  7:29 AM      Component Value Range   Glucose-Capillary 127 (*) 70 - 99 mg/dL   Comment 1 Notify RN     Comment 2 Documented in Chart      Studies/Results: Ir Cleda Clarks Convert Gastr-jej Per W/fl Mod Sed  03/09/2012  *RADIOLOGY REPORT*  IR GASTROSTOMY TUBE CONVERSION TO GASTROJEJUNOSTOMY TUBE WITH FLUORO  Date: 03/09/2012  Clinical History: 41 year old female with chronic abdominal pain, gastroparesis and malnutrition.  She currently cannot tolerate oral intake and cannot maintain adequate nutrition.  A percutaneous gastrostomy tube was placed  on Wednesday, 03/07/2012.  At that time, due to pyloric spasm the small bowel could not be cannulated. She presents to interventional radiology today for a second attempt at placement of a jejunal arm through her existing gastrostomy tube.  Procedures Performed: 1. Placement of a jejunal limb coaxially through the existing gastrostomy tube  Interventional Radiologist:  Criselda Peaches, MD  Fluoroscopy time: 4.5 minutes  Contrast volume: 31 liters Omnipaque-300 administered to the gastrointestinal system  PROCEDURE/FINDINGS:   Informed consent was obtained from the patient following explanation of the procedure, risks, benefits and alternatives. The patient understands, agrees and consents for the procedure. All questions were addressed. A time out was performed.  Maximal barrier sterile technique utilized including caps, mask, sterile gowns, sterile gloves, large sterile drape, hand hygiene, and betadine and prep.  A gentle hand injection of contrast material through the gastric port of the existing gastrostomy tube confirmed intragastric placement. A Kumpe catheter was advanced through the gastrostomy tube into the peripyloric gastric antrum over a Glidewire.  The wire was then removed and a combination of dilute contrast and air were  injected through the Kumpe catheter until the antrum was mildly distended and the pylorus and proximal duodenum were visualized.  The glide wire was then used to cannulate the duodenum and the catheter was ultimately advanced into the proximal jejunum. The glide wire was then exchanged for an Amplatz wire and a 9- [place] jejunal feeding tube was advanced coaxially through the existing NG tube over the wire.  The tip was positioned under fluoroscopy and the proximal jejunum.  Hand injection of contrast material through the jejunal, and gastric port confirmed a jejunal location of the J arm tip, an intragastric location of the g tube.  The patient tolerated the procedure well, there is no immediate complication.  She was returned to the floor in stable condition.  IMPRESSION:  Successful placement of a 37F jejunal feeding limb coaxially through the existing gastrostomy tube.  The tip of the J-arm is positioned in the proximal jejunum and ready for immediate use.  Signed,  Criselda Peaches, MD Vascular & Interventional Radiologist [facility] Radiology  Original Report Authenticated By: Myrle Sheng      Assessment: Chronic pain and narcotic bowel and intolerance of oral intake, jejunostomy placed successfully yesterday  Plan: Will consult nutrition is to begin appropriate tube feeds and advanced as tolerated today. Will sign off for now. Please call if further GI input needed    [name] 03/10/2012, 10:48 AM

## 2012-03-11 LAB — CBC
HCT: 28.8 % — ABNORMAL LOW (ref 36.0–46.0)
Hemoglobin: 9.8 g/dL — ABNORMAL LOW (ref 12.0–15.0)
MCHC: 34 g/dL (ref 30.0–36.0)
RBC: 3.31 MIL/uL — ABNORMAL LOW (ref 3.87–5.11)

## 2012-03-11 LAB — BASIC METABOLIC PANEL
BUN: <3 mg/dL — ABNORMAL LOW (ref 6–23)
CO2: 26 mEq/L (ref 19–32)
Chloride: 100 mEq/L (ref 96–112)
GFR calc non Af Amer: >90 mL/min (ref >90)
Glucose, Bld: 157 mg/dL — ABNORMAL HIGH (ref 70–99)
Potassium: 3.6 mEq/L (ref 3.5–5.1)

## 2012-03-11 LAB — GLUCOSE, CAPILLARY
Glucose-Capillary: 165 mg/dL — ABNORMAL HIGH (ref 70–99)
Glucose-Capillary: 159 mg/dL — ABNORMAL HIGH (ref 70–99)
Glucose-Capillary: 129 mg/dL — ABNORMAL HIGH (ref 70–99)
Glucose-Capillary: 114 mg/dL — ABNORMAL HIGH (ref 70–99)

## 2012-03-11 LAB — CARDIAC PANEL(CRET KIN+CKTOT+MB+TROPI)
CK, MB: 1.4 ng/mL (ref 0.3–4.0)
Relative Index: INVALID (ref 0.0–2.5)
Total CK: 52 U/L (ref 7–177)

## 2012-03-11 MED ORDER — MAGNESIUM SULFATE 40 MG/ML IJ SOLN
2.0000 g | Freq: Once | INTRAMUSCULAR | Status: AC
  Administered 2012-03-11: 2 g via INTRAVENOUS
  Filled 2012-03-11: qty 50

## 2012-03-11 MED ORDER — PROMETHAZINE HCL 25 MG/ML IJ SOLN
25.0000 mg | INTRAMUSCULAR | Status: DC | PRN
  Administered 2012-03-11: 25 mg via INTRAVENOUS
  Administered 2012-03-11: 50 mg via INTRAVENOUS
  Administered 2012-03-11 – 2012-03-12 (×7): 25 mg via INTRAVENOUS
  Administered 2012-03-12: 50 mg via INTRAVENOUS
  Administered 2012-03-13 – 2012-03-14 (×10): 25 mg via INTRAVENOUS
  Filled 2012-03-11 (×4): qty 1
  Filled 2012-03-11: qty 2
  Filled 2012-03-11 (×14): qty 1
  Filled 2012-03-11: qty 2

## 2012-03-11 MED ORDER — NITROGLYCERIN 0.4 MG SL SUBL: 0.4000 mg | SUBLINGUAL_TABLET | SUBLINGUAL | Status: DC | PRN

## 2012-03-11 MED ORDER — PROMETHAZINE HCL 25 MG/ML IJ SOLN
25.0000 mg | INTRAMUSCULAR | Status: DC | PRN
  Administered 2012-03-11: 25 mg via INTRAVENOUS
  Filled 2012-03-11: qty 1

## 2012-03-11 MED ORDER — PROMETHAZINE HCL 25 MG/ML IJ SOLN
12.5000 mg | Freq: Once | INTRAMUSCULAR | Status: AC
  Administered 2012-03-11: 12.5 mg via INTRAVENOUS
  Filled 2012-03-11: qty 1

## 2012-03-11 MED ORDER — NITROGLYCERIN 0.4 MG SL SUBL
SUBLINGUAL_TABLET | SUBLINGUAL | Status: AC
  Administered 2012-03-11: 0.4 mg
  Filled 2012-03-11: qty 25

## 2012-03-11 MED ORDER — HYDRALAZINE HCL 20 MG/ML IJ SOLN
10.0000 mg | Freq: Once | INTRAMUSCULAR | Status: AC
  Administered 2012-03-11: 10 mg via INTRAVENOUS

## 2012-03-11 NOTE — Progress Notes (Signed)
Pt had an episode of chest pain.  Vitals were obtained (BP was high),one nirto administered, EKG done and in chart.  Chest pain relieved with one nitro.  MD notified, new orders obtained and carried out.  Will continue to monitor.

## 2012-03-11 NOTE — Progress Notes (Addendum)
Pt tolerated tube feeds this shift without residual, rate @ 30. Pt had one episode of nausea with dry heaving/no emesis, relieved with Phergan.  Pt also noted to have foul smelling, cloudy amber urine.Marland Kitchendenies urinary symptoms.

## 2012-03-11 NOTE — Progress Notes (Signed)
Throughout the day, pt has complained of pain and nausea not relieved by medications.  Upon entering pt's room, she begins to whale and cry and say "Jesus help me."  Pt is receiving pain medication around the clock and anti-emetic medication frequency has been changed to every 3 hours.  Despite medication regimen, pt continues to call out for pain and nausea medication 1-2 hours before it can be given again.  It has been explained to the pt that she can not get her medication any more frequently than it is being given.  Pt exhibits dry heaving and clear, frothy, spittle in appearance emesis, no evidence of tube feedings in emesis.  MD is aware of the situation.  Pt turned off tube feedings herself.  MD is aware.  Will continue to monitor and update MD.

## 2012-03-11 NOTE — Progress Notes (Signed)
Patient ID: [name], female   DOB: 11/19/1970, 41 y.o.   MRN: [id number]  [facility] PROGRESS NOTE  [name] [id number] DOB: 01/20/1971 DOA: 02/25/2012 PCP: [name], MD   Brief narrative:  Pt is 40 yo female with multiple and complex medical history including recurrent abdominal pain which was determined to be Multifactorial in etiology and secondary to diabetic gastroparesis, narcotic use, chronic pancreatitis.   Active Problems:  Abdominal pain  - this was determined to be multifactorial in etiology and secondary to narcotic bowl, per EGD esophagitis and possibly fungal, gastroparesis  - will continue symptomatic management and appreciate GI input in pt's care  - bowel movement today  - s/p jejunostomy tube placed 03/09/2012, successful  - advanced tube feeds  But pt reports persistent nausea and requesting Phenergan IV only - will continue to monitor   Hypokalemia  - will supplement as indicated  - K is within normal limits today  - will obtain BMP in AM   Ileus  - likely narcotic induced  - pain clinic referral upon discharge as they do not see pts in the hospital  - discussed in detail the pain regimen as pt is constantly asking for increase in dose and frequency  - plan is to taper off IV medication regimen over the next 1-2 days  - encouraged ambulation as pt has been refusing to participate in PT  - OOB to chair and ambulating in the hall  - pt has demonstrated that she is able to ambulate on her own  - will monitor progress   Hypomagnesemia  - very depleted - will supplement today  Diabetes mellitus, uncontrolled and with complications, gastroparesis  - will continue Insulin   Acute blood loss anemia  - post op with dilutional component  - will obtain CBC in AM   Consultants:  GI --> signed off 03/10/2012  Interventional radiology  PCCM - Dr. Melvyn Novas insertion of central venous catheter Mar 30, 2102  Procedures/Studies:  Dg Chest Port 1  View  March 30, 2012  No active cardiopulmonary disease.   EGD  30-Mar-2012  Esophagitis, recommendation is to continue PPI and diflucan   Gastrostomy tube place by Dr. Vernard Gambles 03/07/2012  Jejunostomy tube 03/09/2012   Antibiotics:  None  Code Status: Full  Family Communication: Pt and family at bedside  Disposition Plan: Home when medically stable, pt refuses to be placed to SNF and wants hospital bed at home, encouraged ambulation   HPI/Subjective: No events overnight. Pt reports more nausea and tells me she is not tolerating tube feeds.   Objective: Filed Vitals:   03/10/12 1719 03/10/12 2015 03/10/12 2131 03/11/12 0543  BP: 155/90  165/91 158/91  Pulse: 112 106 110 118  Temp:   98.2 F (36.8 C) 99.3 F (37.4 C)  TempSrc:   Oral Oral  Resp: 16 18 20 22   Height:      Weight:    60.1 kg (132 lb 7.9 oz)  SpO2:  95% 94% 97%    Intake/Output Summary (Last 24 hours) at 03/11/12 1301 Last data filed at 03/11/12 1150  Gross per 24 hour  Intake   2400 ml  Output   4600 ml  Net  -2200 ml    Exam:   General:  Pt is alert, follows commands appropriately, not in acute distress  Cardiovascular: Regular rate and rhythm, S1/S2, no murmurs, no rubs, no gallops  Respiratory: Clear to auscultation bilaterally, no wheezing, no crackles, no rhonchi  Abdomen: Soft, non tender, non  distended, bowel sounds present, no guarding  Extremities: No edema, pulses DP and PT palpable bilaterally  Neuro: Grossly nonfocal  Data Reviewed: Basic Metabolic Panel:  Lab [id number] 03/10/12 0455 03/09/12 0422 03/08/12 0445  NA 134* 135 136 136  K 3.6 3.5 3.1* 3.5  CL 100 103 101 101  CO2 26 25 24 23   GLUCOSE 157* 110* 182* 158*  BUN <3* 3* 4* 5*  CREATININE 0.47* 0.52 0.49* 0.59  CALCIUM 8.4 7.8* 8.3* 8.8  MG 1.1* -- -- --  PHOS -- -- -- --   Liver Function Tests: No results found for this basename: AST:5,ALT:5,ALKPHOS:5,BILITOT:5,PROT:5,ALBUMIN:5 in the last 168 hours No results  found for this basename: LIPASE:5,AMYLASE:5 in the last 168 hours No results found for this basename: AMMONIA:5 in the last 168 hours CBC:  Lab 03/11/12 0423 03/10/12 0455 03/09/12 0422 03/08/12 0445  WBC 7.2 6.8 6.2 14.4*  NEUTROABS -- -- -- --  HGB 9.8* 8.7* 9.6* 11.4*  HCT 28.8* 25.6* 28.9* 34.0*  MCV 87.0 88.6 88.1 88.3  PLT 209 194 201 249   Cardiac Enzymes: No results found for this basename: CKTOTAL:5,CKMB:5,CKMBINDEX:5,TROPONINI:5 in the last 168 hours BNP: No components found with this basename: POCBNP:5 CBG:  Lab 03/11/12 1149 03/11/12 0736 03/10/12 2125 03/10/12 1702 03/10/12 1212  GLUCAP 159* 165* 97 107* 119*    No results found for this or any previous visit (from the past 240 hour(s)).   Scheduled Meds:   . docusate sodium  100 mg Oral BID  . enoxaparin (LOVENOX) injection  40 mg Subcutaneous Q24H  . erythromycin  250 mg Intravenous Q6H  . fentaNYL  25 mcg Transdermal Q72H  . Fluticasone-Salmeterol  1 puff Inhalation BID  . hydrALAZINE  10 mg Intravenous Q6H  . insulin aspart  0-9 Units Subcutaneous TID WC  . metoCLOPramide (REGLAN) injection  10 mg Intravenous Q6H  . oxyCODONE  15 mg Oral Q12H  . pantoprazole (PROTONIX) IV  40 mg Intravenous BID  . potassium chloride  40 mEq Oral Once  . promethazine  12.5 mg Intravenous Once  . scopolamine  1 patch Transdermal Q72H  . sodium chloride  3 mL Intravenous Q12H   Continuous Infusions:   . dextrose 5 % and 0.9% NaCl 1,000 mL with potassium chloride 20 mEq infusion 100 mL/hr at 03/10/12 2157  . feeding supplement (GLUCERNA 1.2 CAL) Stopped (03/11/12 1148)  . lactated ringers       [name], MD  [facility] Pager [phone]  If 7PM-7AM, please contact night-coverage [email or web address] Password [email or web address] 03/11/2012, 1:01 PM   LOS: 15 days

## 2012-03-11 NOTE — Progress Notes (Signed)
CSW met with Pt and Pt refuses SNF. Pt stated that her sister will take care of her with HHPT.   CSW signing off until further needs.    Pete Pelt, LCSWA [employer] [phone]

## 2012-03-12 LAB — CBC
HCT: 29.6 % — ABNORMAL LOW (ref 36.0–46.0)
Hemoglobin: 10.1 g/dL — ABNORMAL LOW (ref 12.0–15.0)
MCH: 29.7 pg (ref 26.0–34.0)
RBC: 3.4 MIL/uL — ABNORMAL LOW (ref 3.87–5.11)

## 2012-03-12 LAB — BASIC METABOLIC PANEL
BUN: <3 mg/dL — ABNORMAL LOW (ref 6–23)
CO2: 26 mEq/L (ref 19–32)
Glucose, Bld: 114 mg/dL — ABNORMAL HIGH (ref 70–99)
Potassium: 3.2 mEq/L — ABNORMAL LOW (ref 3.5–5.1)
Sodium: 132 mEq/L — ABNORMAL LOW (ref 135–145)

## 2012-03-12 LAB — GLUCOSE, CAPILLARY
Glucose-Capillary: 152 mg/dL — ABNORMAL HIGH (ref 70–99)
Glucose-Capillary: 129 mg/dL — ABNORMAL HIGH (ref 70–99)
Glucose-Capillary: 132 mg/dL — ABNORMAL HIGH (ref 70–99)
Glucose-Capillary: 143 mg/dL — ABNORMAL HIGH (ref 70–99)

## 2012-03-12 MED ORDER — POTASSIUM CHLORIDE 10 MEQ/100ML IV SOLN
10.0000 meq | INTRAVENOUS | Status: AC
  Administered 2012-03-12 (×6): 10 meq via INTRAVENOUS
  Filled 2012-03-12 (×6): qty 100

## 2012-03-12 NOTE — Progress Notes (Signed)
Pt had periods of feeling sick throughout night. Spit up fluid that resembled saliva during these episodes.

## 2012-03-12 NOTE — Progress Notes (Signed)
Physical Therapy Treatment Patient Details Name: [name] MRN: [id number] DOB: August 21, 1970 Today's Date: 03/12/2012 Time: [id number] PT Time Calculation (min): 8 min  PT Assessment / Plan / Recommendation Comments on Treatment Session  Pt doing better with mobility however abdominal pain increased to 8/10 during ambulation.    Follow Up Recommendations  No PT follow up    Barriers to Discharge        Equipment Recommendations  Rolling walker with 5" wheels    Recommendations for Other Services    Frequency     Plan Discharge plan remains appropriate;Frequency remains appropriate    Precautions / Restrictions Precautions Precautions: None   Pertinent Vitals/Pain 6/10 abdominal pain increased to 8/10 with ambulation, RN reports pt on schedule for pain meds and still notified of pt request for more meds after ambulation.    Mobility  Bed Mobility Bed Mobility: Supine to Sit Sit to Supine: 6: Modified independent (Device/Increase time);HOB elevated Transfers Transfers: Sit to Stand;Stand to Sit Sit to Stand: 5: Supervision;From bed Stand to Sit: 5: Supervision;To bed Ambulation/Gait Ambulation/Gait Assistance: 4: Min guard Ambulation Distance (Feet): 160 Feet Assistive device: Other (Comment) (IV pole) Ambulation/Gait Assistance Details: pt ambulated pushing IV pole and occasional grabbing for handrail due to pain, declined RW today Gait Pattern: Narrow base of support;Step-through pattern;Decreased stride length Gait velocity: decreased    Exercises     PT Diagnosis:    PT Problem List:   PT Treatment Interventions:     PT Goals Acute Rehab PT Goals PT Goal Formulation: With patient Time For Goal Achievement: 03/19/12 Potential to Achieve Goals: Good Pt will go Supine/Side to Sit: with modified independence;with HOB 0 degrees PT Goal: Supine/Side to Sit - Progress: Goal set today Pt will go Sit to Supine/Side: with modified independence;with HOB 0 degrees PT  Goal: Sit to Supine/Side - Progress: Goal set today Pt will go Sit to Stand: with modified independence PT Goal: Sit to Stand - Progress: Goal set today Pt will go Stand to Sit: with modified independence PT Goal: Stand to Sit - Progress: Goal set today Pt will Ambulate: >150 feet;with modified independence PT Goal: Ambulate - Progress: Goal set today  Visit Information  Last PT Received On: 03/12/12 Assistance Needed: +1    Subjective Data  Subjective: I'm a little better.   Cognition  Overall Cognitive Status: Appears within functional limits for tasks assessed/performed    Balance     End of Session PT - End of Session Activity Tolerance: Patient limited by pain Patient left: in bed;with call bell/phone within reach Nurse Communication: Patient requests pain meds   GP     Atleigh Gruen,KATHrine E 03/12/2012, 10:58 AM Pager: [phone]

## 2012-03-12 NOTE — Progress Notes (Signed)
Patient ID: [name], female   DOB: September 21, 1970, 41 y.o.   MRN: [id number]  [facility] PROGRESS NOTE  [name] [id number] DOB: 08/06/1970 DOA: 02/25/2012 PCP: [name], MD  Brief narrative:  Pt is 41 yo female with multiple and complex medical history including recurrent abdominal pain which was determined to be Multifactorial in etiology and secondary to diabetic gastroparesis, narcotic use, chronic pancreatitis. She was admitted 02/24/2102 and has had jejunostomy tube placed for feeding (03/09/2012), and tube feeds initiated 24 hours later at 30 cc/hr, pt unable to tolerate the feed and were stopped 03/11/2012.  Active Problems:  Abdominal pain  - this was determined to be multifactorial in etiology and secondary to narcotic bowl, per EGD esophagitis and possibly fungal, gastroparesis  - pain is not significantly improved but pt reports current regimen is "taking the edge off" - s/p jejunostomy tube placed 03/09/2012, successful  - advanced tube feeds 03/10/2012 but pt reports persistent nausea and vomiting - we stopped tube feeds 03/11/2012 and pt unwilling to try to advance at this time - will ask GI to come and revisit the case  Hypokalemia  - will supplement as indicated  - will obtain BMP in AM   Ileus  - likely narcotic induced  - pain clinic referral upon discharge may not be possible as they are requesting out of pocket payment and pt unable to afford - discussed in detail the pain regimen as pt is constantly asking for increase in dose and frequency  - patient refuses palliative team to assist with pain medication recommendations - encouraged ambulation as pt has been refusing to participate in PT  - OOB to chair and ambulating in the hall   Hypomagnesemia  - supplemented - obtain Mg level in AM   Diabetes mellitus, uncontrolled and with complications, gastroparesis  - will continue Insulin   Acute blood loss anemia  - post op with dilutional  component  - will obtain CBC in AM   Consultants:  GI --> signed off 03/10/2012  Interventional radiology  PCCM - Dr. Melvyn Novas insertion of central venous catheter 03/06/2102  Procedures/Studies:  Dg Chest Port 1 View  03/06/2012  No active cardiopulmonary disease.   EGD  03/06/2012  Esophagitis, recommendation is to continue PPI and diflucan   Gastrostomy tube place by Dr. Vernard Gambles 03/07/2012   Jejunostomy tube 03/09/2012   Antibiotics:  None  Code Status: Full  Family Communication: Pt and family at bedside  Disposition Plan: Home when medically stable, pt refuses to be placed to SNF and wants hospital bed at home, encouraged ambulation    HPI/Subjective: No events overnight. Pt reports persistent pain, nausea and vomiting, unable to tolerate feeding tubes.  Objective: Filed Vitals:   03/12/12 0322 03/12/12 0500 03/12/12 0511 03/12/12 0741  BP: 134/80  178/106 158/92  Pulse:   121   Temp:   98.7 F (37.1 C)   TempSrc:   Oral   Resp:   20   Height:      Weight:  58.6 kg (129 lb 3 oz)    SpO2:        Intake/Output Summary (Last 24 hours) at 03/12/12 1139 Last data filed at 03/12/12 0520  Gross per 24 hour  Intake 2753.33 ml  Output   2100 ml  Net 653.33 ml    Exam:   General:  Pt is alert, follows commands appropriately, in mild distress due to pain  Cardiovascular: Regular rate and rhythm, S1/S2, no murmurs, no rubs,  no gallops  Respiratory: Clear to auscultation bilaterally, no wheezing, no crackles, no rhonchi  Abdomen: Soft, tender in epigastric area, non distended, bowel sounds present, no guarding  Extremities: No edema, pulses DP and PT palpable bilaterally  Neuro: Grossly nonfocal  Data Reviewed: Basic Metabolic Panel:  Lab [id number] 03/11/12 0423 03/10/12 0455 03/09/12 0422 03/08/12 0445  NA 132* 134* 135 136 136  K 3.2* 3.6 3.5 3.1* 3.5  CL 98 100 103 101 101  CO2 26 26 25 24 23   GLUCOSE 114* 157* 110* 182* 158*  BUN <3* <3* 3* 4*  5*  CREATININE 0.45* 0.47* 0.52 0.49* 0.59  CALCIUM 8.5 8.4 7.8* 8.3* 8.8  MG 1.6 1.1* -- -- --  PHOS -- -- -- -- --   CBC:  Lab 03/12/12 0447 03/11/12 0423 03/10/12 0455 03/09/12 0422 03/08/12 0445  WBC 7.9 7.2 6.8 6.2 14.4*  NEUTROABS -- -- -- -- --  HGB 10.1* 9.8* 8.7* 9.6* 11.4*  HCT 29.6* 28.8* 25.6* 28.9* 34.0*  MCV 87.1 87.0 88.6 88.1 88.3  PLT 239 209 194 201 249   Cardiac Enzymes:  Lab 03/11/12 1831  CKTOTAL 52  CKMB 1.4  CKMBINDEX --  TROPONINI <0.30   CBG:  Lab 03/12/12 0741 03/11/12 2215 03/11/12 1657 03/11/12 1149 03/11/12 0736  GLUCAP 152* 114* 129* 159* 165*   Scheduled Meds:   . docusate sodium  100 mg Oral BID  . enoxaparin injection  40 mg Subcutaneous Q24H  . erythromycin  250 mg Intravenous Q6H  . fentaNYL  25 mcg Transdermal Q72H  . Fluticasone-Salmeterol  1 puff Inhalation BID  . hydrALAZINE  10 mg Intravenous Q6H  . insulin aspart  0-9 Units Subcutaneous TID WC  . metoCLOPramide  inj  10 mg Intravenous Q6H  . nitroGLYCERIN      . oxyCODONE  15 mg Oral Q12H  . pantoprazole IV  40 mg Intravenous BID  . potassium chloride  40 mEq Oral Once  . scopolamine  1 patch Transdermal Q72H   Continuous Infusions:   . dextrose 5 % and 0.9% NaCl 1,000 mL with potassium chloride 20 mEq infusion 100 mL/hr at 03/12/12 0202  . feeding supplement (GLUCERNA 1.2 CAL) Stopped (03/11/12 1148)  . lactated ringers       Martha Ramsay, MD  [facility] Pager [phone]  If 7PM-7AM, please contact night-coverage [email or web address] Password [facility] 03/12/2012, 11:39 AM   LOS: 16 days

## 2012-03-13 LAB — GLUCOSE, CAPILLARY
Glucose-Capillary: 139 mg/dL — ABNORMAL HIGH (ref 70–99)
Glucose-Capillary: 102 mg/dL — ABNORMAL HIGH (ref 70–99)
Glucose-Capillary: 103 mg/dL — ABNORMAL HIGH (ref 70–99)
Glucose-Capillary: 153 mg/dL — ABNORMAL HIGH (ref 70–99)

## 2012-03-13 MED ORDER — GLUCERNA 1.2 CAL PO LIQD: 1000.0000 mL | ORAL | Status: DC

## 2012-03-13 MED ORDER — PROMETHAZINE HCL 12.5 MG PO TABS: 25.0000 mg | ORAL_TABLET | Freq: Four times a day (QID) | ORAL | Status: DC | PRN

## 2012-03-13 MED ORDER — HYDROMORPHONE HCL 4 MG PO TABS: 4.0000 mg | ORAL_TABLET | ORAL | Status: DC | PRN

## 2012-03-13 MED ORDER — GLUCERNA 1.2 CAL PO LIQD
1000.0000 mL | ORAL | Status: DC
  Administered 2012-03-13: 1000 mL
  Filled 2012-03-13 (×2): qty 1000

## 2012-03-13 NOTE — Discharge Summary (Addendum)
Physician Discharge Summary  [name] [id number] DOB: 1971/02/19 DOA: 02/25/2012  PCP: [name], MD  Admit date: 02/25/2012 Discharge date: 03/13/2012  Recommendations for Outpatient Follow-up:  1. Pt will need to follow up with PCP in 2-3 weeks post discharge 2. Pt will also need to see GI in 1-2 weeks post discharge and they advised the pt to schedule the appointment at her convenience 3. Pt prefers to go and follow up with Dr. Hassell Done in [facility] for additional recommendation on management  4. Please obtain BMP to evaluate electrolytes and kidney function 5. Please also check CBC to evaluate Hg and Hct levels 6. Please note that pt was discharged with jejunostomy tube and with TF  Discharge Diagnoses: Abdominal pain secondary to gastroparesis  Active Problems:  Gastroparesis  Hypokalemia  Ileus  Dehydration  Hypomagnesemia   Discharge Condition: Stable  Diet recommendation: Tube feeds and if possible oral intake only if tube feeds not running  Brief narrative:  Pt is 41 yo female with multiple and complex medical history including recurrent abdominal pain which was determined to be Multifactorial in etiology and secondary to diabetic gastroparesis, narcotic use, chronic pancreatitis. She was admitted 02/24/2102 and has had jejunostomy tube placed for feeding (03/09/2012), and tube feeds initiated 24 hours later at 30 cc/hr, pt unable to tolerate the feed and were stopped 03/11/2012. Tube feeds restarted 08/12 and pt tolerating well.   Active Problems:  Abdominal pain  - this was determined to be multifactorial in etiology and secondary to narcotic bowl, per EGD esophagitis and possibly fungal, gastroparesis  - pain is not significantly improved but pt reports current regimen is "taking the edge off"  - s/p jejunostomy tube placed 03/09/2012, successful  - advanced tube feeds 03/10/2012 but pt reports persistent nausea and vomiting  - we stopped tube feeds 03/11/2012  and restarted 08/12, pt currently tolerating well - GI team discussed in detailed advancing TF and if tolerating PO intake, with follow up with Dr. Oletta Lamas in 1-2 weeks   Hypokalemia  - pt refuses oral supplementation - will repeat BMP in AM and decide if need to supplement  Ileus  - likely narcotic induced  - pain clinic referral upon discharge may not be possible as they are requesting out of pocket payment and pt unable to afford  - discussed in detail the pain regimen as pt is constantly asking for increase in dose and frequency  - patient refuses palliative team to assist with pain medication recommendations  - encouraged ambulation as pt has been refusing to participate in PT  - OOB to chair and ambulating in the hall  - can likely be discharged in AM and on oral Dilaudid and Phenragan, to resume home fentanyl patch as she has been getting during the hospital stay  Hypomagnesemia  - supplemented   Diabetes mellitus, uncontrolled and with complications, gastroparesis  - will continue Insulin   Acute blood loss anemia  - post op with dilutional component  - Hg and Hct remain stable and at pt's baseline  Consultants:  GI --> signed off 03/13/2012  Interventional radiology  PCCM - Dr. Melvyn Novas insertion of central venous catheter 03/06/2102  Procedures/Studies:  Dg Chest Port 1 View  03/06/2012  No active cardiopulmonary disease.   EGD  03/06/2012  Esophagitis, recommendation is to continue PPI and diflucan   Gastrostomy tube place by Dr. Vernard Gambles 03/07/2012   Jejunostomy tube 03/09/2012   Antibiotics:  None  Code Status: Full  Family Communication: Pt at  bedside  Disposition Plan: Home win AM, pt refuses to be placed to SNF and wants hospital bed at home, encouraged ambulation  Discharge Exam: Filed Vitals:   03/13/12 2219  BP: 125/87  Pulse: 107  Temp: 97.1 F (36.2 C)  Resp: 18   Filed Vitals:   03/13/12 1310 03/13/12 1610 03/13/12 2108 03/13/12 2219  BP:  168/99 162/94  125/87  Pulse: 127   107  Temp: 97.4 F (36.3 C)   97.1 F (36.2 C)  TempSrc: Oral   Oral  Resp: 18   18  Height:      Weight:      SpO2: 97%  98% 97%    General: Pt is alert, follows commands appropriately, not in acute distress Cardiovascular: Regular rhythm, tachycardic, S1/S2 +, no murmurs, no rubs, no gallops Respiratory: Clear to auscultation bilaterally, no wheezing, no crackles, no rhonchi Abdominal: Soft, tender in epigastric area, non distended, bowel sounds +, no guarding Extremities: no edema, no cyanosis, pulses palpable bilaterally DP and PT Neuro: Grossly nonfocal  Discharge Instructions   Medication List  As of 03/13/2012 10:43 PM   STOP taking these medications         pregabalin 100 MG capsule         TAKE these medications         aspirin 81 MG EC tablet   Take 1 tablet (81 mg total) by mouth daily.      atorvastatin 40 MG tablet   Commonly known as: LIPITOR   Take 1 tablet (40 mg total) by mouth daily.      docusate sodium 100 MG capsule   Commonly known as: COLACE   Take 100 mg by mouth 2 (two) times daily.      esomeprazole 40 MG capsule   Commonly known as: NEXIUM   Take 1 capsule (40 mg total) by mouth daily before breakfast.      feeding supplement (GLUCERNA 1.2 CAL) Liqd   Place 1,000 mLs into feeding tube continuous.      fentaNYL 25 MCG/HR   Commonly known as: DURAGESIC - dosed mcg/hr   Place 1 patch (25 mcg total) onto the skin every 3 (three) days.      Fluticasone-Salmeterol 250-50 MCG/DOSE Aepb   Commonly known as: ADVAIR   Inhale 1 puff into the lungs 2 (two) times daily.      gabapentin 600 MG tablet   Commonly known as: NEURONTIN   Take 1 tablet (600 mg total) by mouth 3 (three) times daily.      HYDROmorphone 4 MG tablet   Commonly known as: DILAUDID   Take 1 tablet (4 mg total) by mouth every 4 (four) hours as needed for pain.      insulin glargine 100 UNIT/ML injection   Commonly known as: LANTUS    Inject 10 Units into the skin at bedtime.      insulin lispro 100 UNIT/ML injection   Commonly known as: HUMALOG   Inject 10-20 Units into the skin 3 (three) times daily before meals. Sliding scale      lisinopril 10 MG tablet   Commonly known as: PRINIVIL,ZESTRIL   Take 2 tablets (20 mg total) by mouth daily.      metoprolol succinate 50 MG 24 hr tablet   Commonly known as: TOPROL-XL   Take 1 tablet (50 mg total) by mouth daily.      multivitamin with minerals Tabs   Take 1 tablet by mouth daily.  pantoprazole 20 MG tablet   Commonly known as: PROTONIX   Take 1 tablet (20 mg total) by mouth daily.      pantoprazole 20 MG tablet   Commonly known as: PROTONIX   Take 1 tablet (20 mg total) by mouth daily.      promethazine 12.5 MG tablet   Commonly known as: PHENERGAN   Take 2 tablets (25 mg total) by mouth every 6 (six) hours as needed for nausea.      zolpidem 5 MG tablet   Commonly known as: AMBIEN   Take 1 tablet (5 mg total) by mouth at bedtime as needed for sleep (insomnia).           Follow-up Information    Follow up with [facility], MD in 2 weeks.   Contact information:   [address] [facility] [phone]       Follow up with EDWARDS Dahlia Client, MD.   Contact information:   [id number], [place], [place]. [place] [phone]           The results of significant diagnostics from this hospitalization (including imaging, microbiology, ancillary and laboratory) are listed below for reference.     Microbiology: No results found for this or any previous visit (from the past 240 hour(s)).   Labs: Basic Metabolic Panel:  Lab [id number] 03/11/12 0423 03/10/12 0455 03/09/12 0422 03/08/12 0445  NA 132* 134* 135 136 136  K 3.2* 3.6 3.5 3.1* 3.5  CL 98 100 103 101 101  CO2 26 26 25 24 23   GLUCOSE 114* 157* 110* 182* 158*  BUN <3* <3* 3* 4* 5*  CREATININE  0.45* 0.47* 0.52 0.49* 0.59  CALCIUM 8.5 8.4 7.8* 8.3* 8.8  MG 1.6 1.1* -- -- --  PHOS -- -- -- -- --   CBC:  Lab 03/12/12 0447 03/11/12 0423 03/10/12 0455 03/09/12 0422 03/08/12 0445  WBC 7.9 7.2 6.8 6.2 14.4*  NEUTROABS -- -- -- -- --  HGB 10.1* 9.8* 8.7* 9.6* 11.4*  HCT 29.6* 28.8* 25.6* 28.9* 34.0*  MCV 87.1 87.0 88.6 88.1 88.3  PLT 239 209 194 201 249   Cardiac Enzymes:  Lab 03/11/12 1831  CKTOTAL 52  CKMB 1.4  CKMBINDEX --  TROPONINI <0.30   CBG:  Lab 03/13/12 2038 03/13/12 1638 03/13/12 1208 03/13/12 0732 03/12/12 2159  GLUCAP 153* 103* 102* 139* 143*     SIGNED: Time coordinating discharge: Over 30 minutes  [name], MD  [facility] 03/13/2012, 10:43 PM Pager [phone]  If 7PM-7AM, please contact night-coverage [email or web address] Password [email or web address]

## 2012-03-13 NOTE — Progress Notes (Signed)
Patient ID: [name], female   DOB: 1970/09/20, 41 y.o.   MRN: [id number] [facility] Gastroenterology Progress Note  [name] 41 y.o. 02-09-1971   Subjective: Reports she feels better since Oxycodone was stopped. +N. C/O abdominal pain. Felt sick on her stomach when jejunal feeds were infusing on 03/11/12. Watching TV in no distress. Wants to go home and see her 4 kids, who is being cared for by her "best friend."  Objective: Vital signs: Filed Vitals:   03/13/12 1002  BP: 146/91  Pulse: 113  Temp: 98.7  Resp: 18    Physical Exam: Gen: alert, tearful, no acute distress  Abd: diffusely tender with guarding, soft, jejunal tube in place (dressing not removed), ND  Lab Results:  Basename 03/12/12 0447 03/11/12 0423  NA 132* 134*  K 3.2* 3.6  CL 98 100  CO2 26 26  GLUCOSE 114* 157*  BUN <3* <3*  CREATININE 0.45* 0.47*  CALCIUM 8.5 8.4  MG 1.6 1.1*  PHOS -- --   No results found for this basename: AST:2,ALT:2,ALKPHOS:2,BILITOT:2,PROT:2,ALBUMIN:2 in the last 72 hours  Basename 03/12/12 0447 03/11/12 0423  WBC 7.9 7.2  NEUTROABS -- --  HGB 10.1* 9.8*  HCT 29.6* 28.8*  MCV 87.1 87.0  PLT 239 209      Assessment/Plan: 41yo with gastroparesis from chronic narcotics. As long as she is on IV narcotic pain meds she will NOT tolerate oral nutrition or jejunal feedings. Narcotic pain meds need to be weaned off and defer that to primary team. Pt. wants to try and eat but I am not convinced she will get adequate nutrition orally at this time and would recommend restarting jejunal feeds and if tolerates then add some oral nutrition as tolerated. Would not start oral and jejunal simultaneously because if N/V develops then won't know which is the source. Jejunal feedings unlikely to cause vomiting since feeds are distal to stomach but her narcotic pain meds complicates the issue and these need to be curtailed by the primary team. Will start jejunal feeds at 10cc/hr and if tolerated  step up per dietician/nutrition recs and if tolerates for 48 hours then add back oral as tolerated. Call us back if needed. Will sign off.   [place] C. 03/13/2012, 11:11 AM

## 2012-03-13 NOTE — Progress Notes (Signed)
Physical Therapy Treatment Patient Details Name: [name] MRN: [id number] DOB: 18-Jun-1971 Today's Date: 03/13/2012 Time: [id number] PT Time Calculation (min): 20 min  PT Assessment / Plan / Recommendation Comments on Treatment Session  Pt cont to demon MAX abd pain with activity and some nausea.. Doing well negociating self around room and to/from BR.  Pt plane to return home.    Follow Up Recommendations  No PT follow up    Barriers to Discharge        Equipment Recommendations  Rolling walker with 5" wheels    Recommendations for Other Services    Frequency Min 3X/week   Plan Discharge plan remains appropriate    Precautions / Restrictions Precautions Precautions: None Precaution Comments: abdominal corset  Restrictions Weight Bearing Restrictions: No    Pertinent Vitals/Pain C/o 10/10 ABD pain after activity    Mobility  Bed Mobility Bed Mobility: Supine to Sit;Sit to Supine Rolling Left: 5: Supervision Left Sidelying to Sit: 5: Supervision Supine to Sit: 5: Supervision Sit to Supine: 5: Supervision Details for Bed Mobility Assistance: increased time  Transfers Transfers: Sit to Stand;Stand to Sit Sit to Stand: 5: Supervision;From bed;From toilet Stand to Sit: 5: Supervision;To toilet;To bed  Ambulation/Gait Ambulation/Gait Assistance: 4: Min guard Ambulation Distance (Feet): 175 Feet Assistive device:  (IV Pole) Ambulation/Gait Assistance Details: Pt prefers to hold to IV pole vs using RW.  demon increased pain and nausea wirth activity.  Returned pt to bed.  Pt curling up with pain, reported to RN. Gait Pattern: Step-through pattern;Decreased stride length;Narrow base of support Gait velocity: decreased     PT Goals                          progressing    Visit Information  Last PT Received On: 03/13/12 Assistance Needed: +1               Balance   good  End of Session PT - End of Session Activity Tolerance: Patient limited by pain Patient  left: in bed;with call bell/phone within reach Nurse Communication: Patient requests pain meds   Rica Koyanagi  PTA [facility]  Acute  Rehab Pager     [phone]

## 2012-03-13 NOTE — Progress Notes (Signed)
SPOKE TO PATIENT WHO SAYS SHE FEELS BETTER,WOULD LIKE TO RESTART JT FEEDING.SAYS SHE FEELS BETTER SINCE SHE DID NOT TAKE OXYCONTIN LAST NIGHT  & THIS MORNING.[place] W/FEEDING.TEACHING MUST START IN [place] WILL ONLY DO 1 HOME VISIT TO INSTRUCT PATIENT/SISTER WHO LIVES IN [place] ABOUT FEEDINGS.WILL NEED ORDER FOR ENTERAL FEEDING IF STANDARD- JEVITY/OSMOLITE JUST NEED DOSE/FREW/DURATION.IF SPECIALTY FORMULA GLUCERNA WILL NEED MEDICAL REASON WHY THIS OVER STANDARD.

## 2012-03-14 ENCOUNTER — Inpatient Hospital Stay (HOSPITAL_COMMUNITY): Payer: PRIVATE HEALTH INSURANCE

## 2012-03-14 LAB — GLUCOSE, CAPILLARY
Glucose-Capillary: 125 mg/dL — ABNORMAL HIGH (ref 70–99)
Glucose-Capillary: 94 mg/dL (ref 70–99)

## 2012-03-14 MED ORDER — HYDROMORPHONE HCL 4 MG PO TABS: 4.0000 mg | ORAL_TABLET | ORAL | Status: AC | PRN

## 2012-03-14 MED ORDER — OSMOLITE 1.2 CAL PO LIQD
1000.0000 mL | ORAL | Status: DC
  Filled 2012-03-14: qty 1000

## 2012-03-14 MED ORDER — OSMOLITE 1.2 CAL PO LIQD: 1000.0000 mL | ORAL | Status: DC

## 2012-03-14 MED ORDER — PROMETHAZINE HCL 12.5 MG PO TABS: 25.0000 mg | ORAL_TABLET | Freq: Four times a day (QID) | ORAL | Status: AC | PRN

## 2012-03-14 MED ORDER — UNABLE TO FIND: Status: DC

## 2012-03-14 MED ORDER — FENTANYL 25 MCG/HR TD PT72: 1.0000 | MEDICATED_PATCH | TRANSDERMAL | Status: AC

## 2012-03-14 NOTE — Progress Notes (Signed)
Interventional Radiology came to see pt, they were able to unclog J tube, and left adapter in room in the event that tube becomes clogged again.

## 2012-03-14 NOTE — Progress Notes (Signed)
[facility] TCVC removed per order prior to d/c home.  Site without signs of infection present.  Petrolateum and 4x4 guaze applied to site, pressure held.  No bleeding from site noted.  Pt and family at bedside verbalized understanding of bedrest for 30 minutes, dressing to be removed in 24-48 hours, signs of infection and interventions for bleeding noted at site via teach back method.  No ADN.

## 2012-03-14 NOTE — Progress Notes (Signed)
Patient's tube feeding pump began to beep occluded.  Attempted to flush tube several times with no success.  Notified the GI MD on call, Dr. Penelope Coop, who said to leave tube alone for the night and patient would be assessed in the morning.  Tube feeding held. Patient monitored throughout the night with no further issues except the normal pain and nausea. Owens Shark, Wilda Wetherell Cherie

## 2012-03-14 NOTE — Progress Notes (Addendum)
Nutrition Brief Note  - Discussed TF and [facility] with case manager and specialty versus standard TF formulas discussed. Pt on insulin at home. Noted CBGs on Glucerna 1.2 were similar to CBGs prior to TF initiation. Re-estimated pt's nutritional needs to be 1750-2050 calories and 87-100g protein r/t pt with chronic pancreatitis. Changed TF order to Osmolite 1.2 at 71ml/hr run continuously via J tube which provides 1728 calories, 80g protein, and 1149ml free water. Recommend 138ml water flushes 5 times daily to meet all of pt's fluid needs. Met with pt to discuss TF change and answered all of pt's questions. Provided RD contact information if future concerns arise.   Dietitian# [phone]

## 2012-03-14 NOTE — Progress Notes (Signed)
PCP: [name], MD  Subjective: Patient feels well. No complaints offered.  Objective: Vital signs in last 24 hours: Temp:  [97.1 F (36.2 C)-99.4 F (37.4 C)] 99.4 F (37.4 C) (08/14 0536) Pulse Rate:  [103-127] 103  (08/14 0536) Resp:  [18] 18  (08/14 0536) BP: (125-168)/(80-99) 146/89 mmHg (08/14 1012) SpO2:  [95 %-98 %] 95 % (08/14 1014) Weight:  [58.1 kg (128 lb 1.4 oz)] 58.1 kg (128 lb 1.4 oz) (08/14 0536) Weight change:  Last BM Date: 03/09/12  Intake/Output from previous day: 08/13 0701 - 08/14 0700 In: 2614 [I.V.:2400; IV Piggyback:214] Out: 2975 [Urine:2975] Intake/Output this shift:    General appearance: alert, cooperative, appears stated age and no distress Neurologic: Alert and oriented X 3, normal strength and tone. Normal symmetric reflexes. Normal coordination and gait  Lab Results:  [facility] 03/12/12 0447  WBC 7.9  HGB 10.1*  HCT 29.6*  PLT 239   BMET  Basename 03/12/12 0447  NA 132*  K 3.2*  CL 98  CO2 26  GLUCOSE 114*  BUN <3*  CREATININE 0.45*  CALCIUM 8.5  ALT --    Studies/Results: No results found.  Medications:  Scheduled:   . docusate sodium  100 mg Oral BID  . enoxaparin (LOVENOX) injection  40 mg Subcutaneous Q24H  . erythromycin  250 mg Intravenous Q6H  . fentaNYL  25 mcg Transdermal Q72H  . Fluticasone-Salmeterol  1 puff Inhalation BID  . hydrALAZINE  10 mg Intravenous Q6H  . insulin aspart  0-9 Units Subcutaneous TID WC  . metoCLOPramide (REGLAN) injection  10 mg Intravenous Q6H  . oxyCODONE  15 mg Oral Q12H  . pantoprazole (PROTONIX) IV  40 mg Intravenous BID  . scopolamine  1 patch Transdermal Q72H  . sodium chloride  3 mL Intravenous Q12H   Continuous:   . dextrose 5 % and 0.9% NaCl 1,000 mL with potassium chloride 20 mEq infusion 100 mL/hr at 03/14/12 0226  . feeding supplement (OSMOLITE 1.2 CAL)    . lactated ringers    . DISCONTD: feeding supplement (GLUCERNA 1.2 CAL) 1,000 mL (03/13/12 1220)    [id number], acetaminophen, alum & mag hydroxide-simeth, HYDROmorphone (DILAUDID) injection, magic mouthwash w/lidocaine, nitroGLYCERIN, promethazine, sodium chloride, zolpidem  Assessment/Plan:  Active Problems:  Gastroparesis  Hypokalemia  Ileus  Dehydration  Hypomagnesemia   Please see discharge summary from yesterday.   Abdominal pain  - this was determined to be multifactorial in etiology and secondary to narcotic bowl, per EGD esophagitis and possibly fungal, gastroparesis  - pain is not significantly improved but pt reports current regimen is "taking the edge off"  - s/p jejunostomy tube placed 03/09/2012, successful  - advanced tube feeds 03/10/2012 but pt reports persistent nausea and vomiting  - we stopped tube feeds 03/11/2012 and restarted 08/12, pt currently tolerating well  - GI team discussed in detailed advancing TF and if tolerating PO intake, with follow up with Dr. Oletta Lamas in 1-2 weeks  - J tube was clogged this am and was unclogged by IR.  Ileus  - likely narcotic induced  - pain clinic referral upon discharge may not be possible as they are requesting out of pocket payment and pt unable to afford  - discussed in detail the pain regimen as pt is constantly asking for increase in dose and frequency  - patient refuses palliative team to assist with pain medication recommendations  - encouraged ambulation as pt has been refusing to participate in PT  - OOB to chair and  ambulating in the hall  - can be discharged on oral Dilaudid and Phenergan, to resume home fentanyl patch as she has been getting during the hospital stay   Hypomagnesemia  - supplemented   Diabetes mellitus, uncontrolled and with complications, gastroparesis  - will continue Insulin   Acute blood loss anemia  - post op with dilutional component  - Hg and Hct remain stable and at pt's baseline   Ok for discharge. Feeding supplies have been arranged.    LOS: 18 days    [facility] Pager [phone] 03/14/2012, 12:42 PM

## 2012-12-24 ENCOUNTER — Encounter (HOSPITAL_COMMUNITY): Payer: Self-pay | Admitting: Emergency Medicine

## 2012-12-24 ENCOUNTER — Inpatient Hospital Stay (HOSPITAL_COMMUNITY)
Admission: EM | Admit: 2012-12-24 | Discharge: 2012-12-29 | DRG: 073 | Disposition: A | Discharge status: Home / Self Care | Payer: Medicare Other | Attending: Internal Medicine | Admitting: Internal Medicine

## 2012-12-24 ENCOUNTER — Emergency Department (HOSPITAL_COMMUNITY): Payer: Medicare Other

## 2012-12-24 DIAGNOSIS — E1165 Type 2 diabetes mellitus with hyperglycemia: Secondary | ICD-10-CM

## 2012-12-24 DIAGNOSIS — F17200 Nicotine dependence, unspecified, uncomplicated: Secondary | ICD-10-CM | POA: Diagnosis present

## 2012-12-24 DIAGNOSIS — K5900 Constipation, unspecified: Secondary | ICD-10-CM

## 2012-12-24 DIAGNOSIS — IMO0002 Reserved for concepts with insufficient information to code with codable children: Secondary | ICD-10-CM | POA: Diagnosis present

## 2012-12-24 DIAGNOSIS — Z431 Encounter for attention to gastrostomy: Secondary | ICD-10-CM

## 2012-12-24 DIAGNOSIS — K2980 Duodenitis without bleeding: Secondary | ICD-10-CM | POA: Diagnosis present

## 2012-12-24 DIAGNOSIS — J4489 Other specified chronic obstructive pulmonary disease: Secondary | ICD-10-CM | POA: Diagnosis present

## 2012-12-24 DIAGNOSIS — Z794 Long term (current) use of insulin: Secondary | ICD-10-CM

## 2012-12-24 DIAGNOSIS — E871 Hypo-osmolality and hyponatremia: Secondary | ICD-10-CM | POA: Diagnosis present

## 2012-12-24 DIAGNOSIS — E860 Dehydration: Secondary | ICD-10-CM

## 2012-12-24 DIAGNOSIS — E1149 Type 2 diabetes mellitus with other diabetic neurological complication: Principal | ICD-10-CM | POA: Diagnosis present

## 2012-12-24 DIAGNOSIS — D72829 Elevated white blood cell count, unspecified: Secondary | ICD-10-CM | POA: Diagnosis present

## 2012-12-24 DIAGNOSIS — I10 Essential (primary) hypertension: Secondary | ICD-10-CM | POA: Diagnosis present

## 2012-12-24 DIAGNOSIS — R112 Nausea with vomiting, unspecified: Secondary | ICD-10-CM | POA: Diagnosis present

## 2012-12-24 DIAGNOSIS — I252 Old myocardial infarction: Secondary | ICD-10-CM

## 2012-12-24 DIAGNOSIS — J449 Chronic obstructive pulmonary disease, unspecified: Secondary | ICD-10-CM | POA: Diagnosis present

## 2012-12-24 DIAGNOSIS — N179 Acute kidney failure, unspecified: Secondary | ICD-10-CM

## 2012-12-24 DIAGNOSIS — K920 Hematemesis: Secondary | ICD-10-CM | POA: Diagnosis present

## 2012-12-24 DIAGNOSIS — R109 Unspecified abdominal pain: Secondary | ICD-10-CM

## 2012-12-24 DIAGNOSIS — N170 Acute kidney failure with tubular necrosis: Secondary | ICD-10-CM | POA: Diagnosis present

## 2012-12-24 DIAGNOSIS — K3184 Gastroparesis: Secondary | ICD-10-CM | POA: Diagnosis present

## 2012-12-24 DIAGNOSIS — K219 Gastro-esophageal reflux disease without esophagitis: Secondary | ICD-10-CM | POA: Diagnosis present

## 2012-12-24 HISTORY — DX: Unspecified convulsions: R56.9

## 2012-12-24 LAB — COMPREHENSIVE METABOLIC PANEL
Albumin: 2.9 g/dL — ABNORMAL LOW (ref 3.5–5.2)
Alkaline Phosphatase: 128 U/L — ABNORMAL HIGH (ref 39–117)
BUN: 16 mg/dL (ref 6–23)
Creatinine, Ser: 0.56 mg/dL (ref 0.50–1.10)
GFR calc Af Amer: >90 mL/min (ref >90)
Glucose, Bld: 394 mg/dL — ABNORMAL HIGH (ref 70–99)
Total Bilirubin: 0.2 mg/dL — ABNORMAL LOW (ref 0.3–1.2)
Total Protein: 8.3 g/dL (ref 6.0–8.3)

## 2012-12-24 LAB — CBC WITH DIFFERENTIAL/PLATELET
Basophils Absolute: 0 10*3/uL (ref 0.0–0.1)
Basophils Relative: 0 % (ref 0–1)
Eosinophils Absolute: 0 10*3/uL (ref 0.0–0.7)
HCT: 37.4 % (ref 36.0–46.0)
Hemoglobin: 13.2 g/dL (ref 12.0–15.0)
Lymphs Abs: 1.6 10*3/uL (ref 0.7–4.0)
MCH: 30.4 pg (ref 26.0–34.0)
MCHC: 35.3 g/dL (ref 30.0–36.0)
MCV: 86.2 fL (ref 78.0–100.0)
Neutro Abs: 13.6 10*3/uL — ABNORMAL HIGH (ref 1.7–7.7)
RDW: 14.7 % (ref 11.5–15.5)

## 2012-12-24 LAB — URINALYSIS, ROUTINE W REFLEX MICROSCOPIC
Bilirubin Urine: NEGATIVE
Ketones, ur: NEGATIVE mg/dL
Leukocytes, UA: NEGATIVE
Nitrite: NEGATIVE
Specific Gravity, Urine: >1.046 — ABNORMAL HIGH (ref 1.005–1.030)
Urobilinogen, UA: 0.2 mg/dL (ref 0.0–1.0)
pH: 7 (ref 5.0–8.0)

## 2012-12-24 LAB — GLUCOSE, CAPILLARY
Glucose-Capillary: 330 mg/dL — ABNORMAL HIGH (ref 70–99)
Glucose-Capillary: 220 mg/dL — ABNORMAL HIGH (ref 70–99)

## 2012-12-24 LAB — LIPASE, BLOOD: Lipase: 22 U/L (ref 11–59)

## 2012-12-24 LAB — URINE MICROSCOPIC-ADD ON

## 2012-12-24 MED ORDER — MOMETASONE FURO-FORMOTEROL FUM 100-5 MCG/ACT IN AERO
2.0000 | INHALATION_SPRAY | Freq: Two times a day (BID) | RESPIRATORY_TRACT | Status: DC
  Administered 2012-12-24 – 2012-12-29 (×10): 2 via RESPIRATORY_TRACT
  Filled 2012-12-24: qty 8.8

## 2012-12-24 MED ORDER — HYDROMORPHONE HCL PF 1 MG/ML IJ SOLN
1.0000 mg | Freq: Once | INTRAMUSCULAR | Status: AC
  Administered 2012-12-24: 1 mg via INTRAVENOUS
  Filled 2012-12-24: qty 1

## 2012-12-24 MED ORDER — ADULT MULTIVITAMIN W/MINERALS CH
1.0000 | ORAL_TABLET | Freq: Every day | ORAL | Status: DC
  Administered 2012-12-24 – 2012-12-29 (×5): 1 via ORAL
  Filled 2012-12-24 (×6): qty 1

## 2012-12-24 MED ORDER — ZOLPIDEM TARTRATE 5 MG PO TABS: 5.0000 mg | ORAL_TABLET | Freq: Every evening | ORAL | Status: DC | PRN

## 2012-12-24 MED ORDER — PANTOPRAZOLE SODIUM 40 MG IV SOLR
40.0000 mg | Freq: Once | INTRAVENOUS | Status: AC
  Administered 2012-12-24: 40 mg via INTRAVENOUS
  Filled 2012-12-24: qty 40

## 2012-12-24 MED ORDER — OSMOLITE 1.2 CAL PO LIQD
1000.0000 mL | ORAL | Status: DC
  Administered 2012-12-24 – 2012-12-26 (×3): 1000 mL
  Filled 2012-12-24 (×4): qty 1000

## 2012-12-24 MED ORDER — GABAPENTIN 600 MG PO TABS
600.0000 mg | ORAL_TABLET | Freq: Three times a day (TID) | ORAL | Status: DC
  Filled 2012-12-24: qty 1

## 2012-12-24 MED ORDER — PANTOPRAZOLE SODIUM 40 MG IV SOLR
40.0000 mg | Freq: Two times a day (BID) | INTRAVENOUS | Status: DC
  Administered 2012-12-24 – 2012-12-28 (×9): 40 mg via INTRAVENOUS
  Filled 2012-12-24 (×11): qty 40

## 2012-12-24 MED ORDER — PROMETHAZINE HCL 25 MG/ML IJ SOLN
25.0000 mg | INTRAMUSCULAR | Status: DC | PRN
Start: 2012-12-24 — End: 2012-12-29
  Administered 2012-12-24 – 2012-12-29 (×20): 25 mg via INTRAVENOUS
  Filled 2012-12-24 (×21): qty 1

## 2012-12-24 MED ORDER — SODIUM CHLORIDE 0.9 % IV SOLN
INTRAVENOUS | Status: AC
  Administered 2012-12-24: 18:00:00 via INTRAVENOUS

## 2012-12-24 MED ORDER — DOCUSATE SODIUM 100 MG PO CAPS
100.0000 mg | ORAL_CAPSULE | Freq: Two times a day (BID) | ORAL | Status: DC
  Administered 2012-12-25 – 2012-12-29 (×7): 100 mg via ORAL
  Filled 2012-12-24 (×11): qty 1

## 2012-12-24 MED ORDER — FENTANYL CITRATE 0.05 MG/ML IJ SOLN
25.0000 ug | Freq: Once | INTRAMUSCULAR | Status: DC
  Filled 2012-12-24: qty 2

## 2012-12-24 MED ORDER — IOHEXOL 300 MG/ML  SOLN
50.0000 mL | Freq: Once | INTRAMUSCULAR | Status: AC | PRN
  Administered 2012-12-24: 50 mL via ORAL

## 2012-12-24 MED ORDER — PROMETHAZINE HCL 25 MG/ML IJ SOLN
25.0000 mg | Freq: Once | INTRAMUSCULAR | Status: AC
  Administered 2012-12-24: 25 mg via INTRAMUSCULAR
  Filled 2012-12-24: qty 1

## 2012-12-24 MED ORDER — METOPROLOL TARTRATE 25 MG/10 ML ORAL SUSPENSION
25.0000 mg | Freq: Two times a day (BID) | ORAL | Status: DC
  Administered 2012-12-24 – 2012-12-25 (×3): 25 mg via ORAL
  Filled 2012-12-24 (×5): qty 10

## 2012-12-24 MED ORDER — OSMOLITE 1.2 CAL PO LIQD
1000.0000 mL | ORAL | Status: DC
  Filled 2012-12-24: qty 1000

## 2012-12-24 MED ORDER — IOHEXOL 300 MG/ML  SOLN
100.0000 mL | Freq: Once | INTRAMUSCULAR | Status: AC | PRN
  Administered 2012-12-24: 100 mL via INTRAVENOUS

## 2012-12-24 MED ORDER — ATORVASTATIN CALCIUM 40 MG PO TABS
40.0000 mg | ORAL_TABLET | Freq: Every day | ORAL | Status: DC
  Administered 2012-12-24 – 2012-12-28 (×5): 40 mg via ORAL
  Filled 2012-12-24 (×6): qty 1

## 2012-12-24 MED ORDER — LISINOPRIL 20 MG PO TABS
20.0000 mg | ORAL_TABLET | Freq: Every day | ORAL | Status: DC
  Administered 2012-12-24 – 2012-12-25 (×2): 20 mg via ORAL
  Filled 2012-12-24 (×2): qty 1

## 2012-12-24 MED ORDER — HYDROMORPHONE HCL PF 1 MG/ML IJ SOLN
1.0000 mg | INTRAMUSCULAR | Status: DC | PRN
  Administered 2012-12-24 – 2012-12-25 (×5): 1 mg via INTRAVENOUS
  Filled 2012-12-24 (×5): qty 1

## 2012-12-24 MED ORDER — HYDROMORPHONE HCL PF 2 MG/ML IJ SOLN
2.0000 mg | Freq: Once | INTRAMUSCULAR | Status: AC
  Administered 2012-12-24: 2 mg via INTRAMUSCULAR
  Filled 2012-12-24: qty 1

## 2012-12-24 MED ORDER — INSULIN ASPART 100 UNIT/ML ~~LOC~~ SOLN
0.0000 [IU] | Freq: Three times a day (TID) | SUBCUTANEOUS | Status: DC
  Administered 2012-12-24: 11 [IU] via SUBCUTANEOUS
  Administered 2012-12-25: 8 [IU] via SUBCUTANEOUS
  Administered 2012-12-25: 5 [IU] via SUBCUTANEOUS
  Administered 2012-12-25: 2 [IU] via SUBCUTANEOUS
  Administered 2012-12-26: 5 [IU] via SUBCUTANEOUS
  Administered 2012-12-26: 3 [IU] via SUBCUTANEOUS
  Administered 2012-12-26: 5 [IU] via SUBCUTANEOUS
  Administered 2012-12-27: 2 [IU] via SUBCUTANEOUS

## 2012-12-24 MED ORDER — HYDRALAZINE HCL 20 MG/ML IJ SOLN
10.0000 mg | Freq: Four times a day (QID) | INTRAMUSCULAR | Status: DC
  Administered 2012-12-24 – 2012-12-25 (×3): 10 mg via INTRAVENOUS
  Filled 2012-12-24: qty 1
  Filled 2012-12-24 (×2): qty 0.5
  Filled 2012-12-24: qty 1
  Filled 2012-12-24 (×2): qty 0.5

## 2012-12-24 MED ORDER — SODIUM CHLORIDE 0.9 % IV BOLUS (SEPSIS)
1000.0000 mL | Freq: Once | INTRAVENOUS | Status: AC
  Administered 2012-12-24: 1000 mL via INTRAVENOUS

## 2012-12-24 MED ORDER — PANTOPRAZOLE SODIUM 40 MG PO TBEC
40.0000 mg | DELAYED_RELEASE_TABLET | Freq: Every day | ORAL | Status: DC
  Administered 2012-12-24: 40 mg via ORAL
  Filled 2012-12-24: qty 1

## 2012-12-24 MED ORDER — SODIUM CHLORIDE 0.9 % IJ SOLN
3.0000 mL | Freq: Two times a day (BID) | INTRAMUSCULAR | Status: DC
  Administered 2012-12-24 – 2012-12-26 (×2): 3 mL via INTRAVENOUS
  Administered 2012-12-27: 22:00:00 via INTRAVENOUS
  Administered 2012-12-28 – 2012-12-29 (×2): 3 mL via INTRAVENOUS

## 2012-12-24 MED ORDER — METOPROLOL SUCCINATE ER 50 MG PO TB24
50.0000 mg | ORAL_TABLET | Freq: Every day | ORAL | Status: DC
  Filled 2012-12-24: qty 1

## 2012-12-24 MED ORDER — GABAPENTIN 300 MG PO CAPS
600.0000 mg | ORAL_CAPSULE | Freq: Three times a day (TID) | ORAL | Status: DC
  Administered 2012-12-24 – 2012-12-29 (×14): 600 mg via ORAL
  Filled 2012-12-24 (×16): qty 2

## 2012-12-24 MED ORDER — INSULIN GLARGINE 100 UNIT/ML ~~LOC~~ SOLN
10.0000 [IU] | Freq: Every day | SUBCUTANEOUS | Status: DC
  Administered 2012-12-24 – 2012-12-28 (×5): 10 [IU] via SUBCUTANEOUS
  Filled 2012-12-24 (×6): qty 0.1

## 2012-12-24 MED ORDER — ENOXAPARIN SODIUM 30 MG/0.3ML ~~LOC~~ SOLN
30.0000 mg | SUBCUTANEOUS | Status: DC
  Administered 2012-12-24 – 2012-12-26 (×3): 30 mg via SUBCUTANEOUS
  Filled 2012-12-24 (×4): qty 0.3

## 2012-12-24 NOTE — ED Notes (Signed)
[id number] Expected date:<BR> Expected time:<BR> Means of arrival:<BR> Comments:<BR> ems 42 yo female vomiting

## 2012-12-24 NOTE — Progress Notes (Signed)
Pt arrived from ED, alert, emesis x 1 during transfer from stretcher to bed. Medicated per order. Will continue to monitor.

## 2012-12-24 NOTE — ED Notes (Signed)
Unable to get blood/labs  from pt after multiple attempts from phlebotomy, IV nurse, and MD. MD decided upon femoral stick and was successful.

## 2012-12-24 NOTE — ED Notes (Signed)
Patient transported to CT 

## 2012-12-24 NOTE — ED Notes (Signed)
Per EMS: started having abd pain after eating late last night by mouth, pt has feeding tube. Pt has been vomiting since last night and is nauseous at the time. CBG 435

## 2012-12-24 NOTE — ED Provider Notes (Addendum)
History     CSN: [id number]  Arrival date & time 12/24/12  1221   First MD Initiated Contact with Patient 12/24/12 1259      Chief Complaint  Patient presents with  . Abdominal Pain    (Consider location/radiation/quality/duration/timing/severity/associated sxs/prior treatment) HPI [place] of epigastric pain typical of gastroparesis onset 2 weeks ago becoming worse over the past 2 nights. She's been unable hold down her medications. Pain is nonradiating severe. Made worse with movement she's had multiple episodes of vomiting denies fever. No treatment prior to coming here. Last vomited yesterday, normal.no other associated symptoms Past Medical History  Diagnosis Date  . Hypertension   . Diabetes mellitus   . Gastroparesis   . Asthma   . GERD (gastroesophageal reflux disease)   . Coronary artery disease   . h/o seizure   . Neuropathy   . Chronic pancreatitis   . Dyslipidemia   . MI (myocardial infarction)   . COPD (chronic obstructive pulmonary disease)     Past Surgical History  Procedure Laterality Date  . Abdominal hysterectomy    . Cholecystectomy    . Peg tube x 4      feeding jejunostomies with PEG tubes  . Cesarean section    . Colon resection due to diverticulitis    . Back surgery      Family History  Problem Relation Age of Onset  . Cancer Mother     History  Substance Use Topics  . Smoking status: Current Every Day Smoker -- 0.25 packs/day for 2 years    Types: Cigarettes  . Smokeless tobacco: Never Used  . Alcohol Use: No     Comment: hx 1 beer daily -pt states she quit Dec. 2012    OB History   Grav Para Term Preterm Abortions TAB SAB Ect Mult Living                  Review of Systems  Constitutional: Negative.   HENT: Negative.   Respiratory: Negative.   Cardiovascular: Negative.   Gastrointestinal: Positive for vomiting and abdominal pain.  Musculoskeletal: Negative.   Skin: Negative.   Neurological: Negative.    Psychiatric/Behavioral: Negative.   All other systems reviewed and are negative.    Allergies  Penicillins; Compazine; Darvocet; Nsaids; and Zofran  Home Medications   Current Outpatient Rx  Name  Route  Sig  Dispense  Refill  . EXPIRED: atorvastatin (LIPITOR) 40 MG tablet   Oral   Take 1 tablet (40 mg total) by mouth daily.   30 tablet   0   . docusate sodium (COLACE) 100 MG capsule   Oral   Take 100 mg by mouth 2 (two) times daily.         Marland Kitchen esomeprazole (NEXIUM) 40 MG capsule   Oral   Take 1 capsule (40 mg total) by mouth daily before breakfast.   30 capsule   0   . Fluticasone-Salmeterol (ADVAIR) 250-50 MCG/DOSE AEPB   Inhalation   Inhale 1 puff into the lungs 2 (two) times daily.   60 each   0   . EXPIRED: gabapentin (NEURONTIN) 600 MG tablet   Oral   Take 1 tablet (600 mg total) by mouth 3 (three) times daily.   90 tablet   0   . EXPIRED: insulin glargine (LANTUS) 100 UNIT/ML injection   Subcutaneous   Inject 10 Units into the skin at bedtime.   3 mL   4     Please give  pen   . insulin lispro (HUMALOG) 100 UNIT/ML injection   Subcutaneous   Inject 10-20 Units into the skin 3 (three) times daily before meals. Sliding scale   10 mL   2   . EXPIRED: lisinopril (PRINIVIL) 10 MG tablet   Oral   Take 2 tablets (20 mg total) by mouth daily.   60 tablet   0   . EXPIRED: metoprolol succinate (TOPROL XL) 50 MG 24 hr tablet   Oral   Take 1 tablet (50 mg total) by mouth daily.   30 tablet   0   . Multiple Vitamin (MULITIVITAMIN WITH MINERALS) TABS   Oral   Take 1 tablet by mouth daily.   30 tablet   0   . Nutritional Supplements (FEEDING SUPPLEMENT, OSMOLITE 1.2 CAL,) LIQD   Per Tube   Place 1,000 mLs into feeding tube continuous. 48ml/hr continuously for 90 days   1000 mL   10   . UNABLE TO FIND      Free Water: 120 ml 5 times a day Pump for tube feeding IV Pole Clamp Bags   1 each   0   . EXPIRED: zolpidem (AMBIEN) 5 MG tablet    Oral   Take 1 tablet (5 mg total) by mouth at bedtime as needed for sleep (insomnia).   30 tablet   0     BP 173/112  Pulse 116  Temp(Src) 98.3 F (36.8 C)  Resp 22  SpO2 100%  Physical Exam  Nursing note and vitals reviewed. Constitutional: She appears well-developed and well-nourished. She appears distressed.  Appears uncomfortable Glasgow Coma Score 15  HENT:  Head: Normocephalic and atraumatic.  Eyes: Conjunctivae are normal. Pupils are equal, round, and reactive to light.  Neck: Neck supple. No tracheal deviation present. No thyromegaly present.  Cardiovascular: Regular rhythm.   No murmur heard. Tachycardic  Pulmonary/Chest: Effort normal and breath sounds normal.  Abdominal: Soft. Bowel sounds are normal. She exhibits no distension. There is tenderness. There is guarding.  Tender at epigastrium. J-tube in place  Musculoskeletal: Normal range of motion. She exhibits no edema and no tenderness.  Neurological: She is alert. Coordination normal.  Skin: Skin is warm and dry. No rash noted.  Psychiatric: She has a normal mood and affect.    ED Course  Procedures (including critical care time)  Labs Reviewed  CBC WITH DIFFERENTIAL  COMPREHENSIVE METABOLIC PANEL  LIPASE, BLOOD  URINALYSIS, ROUTINE W REFLEX MICROSCOPIC   No results found.   No diagnosis found.  Nursing unable to establish IV access. I attempted external jugular peripheral venous access, without success  Date: 12/24/2012  Rate: 115  Rhythm: sinus tachycardia  QRS Axis: left  Intervals: normal  ST/T Wave abnormalities: normal  Conduction Disutrbances:none  Narrative Interpretation:   Old EKG Reviewed: No significant change from 03/11/2012 interpreted by me Results for orders placed during the hospital encounter of 12/24/12  CBC WITH DIFFERENTIAL      Result Value Range   WBC 15.7 (*) 4.0 - 10.5 K/uL   RBC 4.34  3.87 - 5.11 MIL/uL   Hemoglobin 13.2  12.0 - 15.0 g/dL   HCT 37.4  36.0 - 46.0 %    MCV 86.2  78.0 - 100.0 fL   MCH 30.4  26.0 - 34.0 pg   MCHC 35.3  30.0 - 36.0 g/dL   RDW 14.7  11.5 - 15.5 %   Platelets 298  150 - 400 K/uL   Neutrophils Relative % 87 (*)  43 - 77 %   Lymphocytes Relative 10 (*) 12 - 46 %   Monocytes Relative 3  3 - 12 %   Eosinophils Relative 0  0 - 5 %   Basophils Relative 0  0 - 1 %   Neutro Abs 13.6 (*) 1.7 - 7.7 K/uL   Lymphs Abs 1.6  0.7 - 4.0 K/uL   Monocytes Absolute 0.5  0.1 - 1.0 K/uL   Eosinophils Absolute 0.0  0.0 - 0.7 K/uL   Basophils Absolute 0.0  0.0 - 0.1 K/uL   Smear Review LARGE PLATELETS PRESENT    COMPREHENSIVE METABOLIC PANEL      Result Value Range   Sodium 127 (*) 135 - 145 mEq/L   Potassium 4.3  3.5 - 5.1 mEq/L   Chloride 87 (*) 96 - 112 mEq/L   CO2 27  19 - 32 mEq/L   Glucose, Bld 394 (*) 70 - 99 mg/dL   BUN 16  6 - 23 mg/dL   Creatinine, Ser 0.56  0.50 - 1.10 mg/dL   Calcium 9.6  8.4 - 10.5 mg/dL   Total Protein 8.3  6.0 - 8.3 g/dL   Albumin 2.9 (*) 3.5 - 5.2 g/dL   AST 27  0 - 37 U/L   ALT 14  0 - 35 U/L   Alkaline Phosphatase 128 (*) 39 - 117 U/L   Total Bilirubin 0.2 (*) 0.3 - 1.2 mg/dL   GFR calc non Af Amer >90  >90 mL/min   GFR calc Af Amer >90  >90 mL/min  LIPASE, BLOOD      Result Value Range   Lipase 22  11 - 59 U/L   Ct Abdomen Pelvis W Contrast  12/24/2012   *RADIOLOGY REPORT*  Clinical Data: Abdominal pain.  Nausea and vomiting.  Indwelling jejunal feeding tube.  Surgical history includes cholecystectomy, hysterectomy, and resection.  CT ABDOMEN AND PELVIS WITH CONTRAST  Technique:  Multidetector CT imaging of the abdomen and pelvis was performed following the standard protocol during bolus administration of intravenous contrast.  Contrast: 124mL OMNIPAQUE IOHEXOL 300 MG/ML IV. Oral contrast was also administered.  Comparison: CT abdomen and pelvis 02/26/2012.  Findings: Gastrostomy tube in the proximal body of the stomach without complicating features.  Coaxial small bowel feeding tube is looped in  the fourth portion of the duodenum with its tip back and the distal third portion of the duodenum.  Circumferential wall thickening involving the second portion of the duodenum, with luminal narrowing.  Small bowel anastomosis in the left upper quadrant is widely patent; mild dilation of this segment is secondary to the expected post operative atony.  Remaining small bowel normal in appearance.  Sigmoid colon extends upward into the left upper quadrant of the abdomen.  Entire colon decompressed and unremarkable.  Normal appendix in the right upper pelvis.  No ascites.  Mild diffuse hepatic steatosis without focal hepatic parenchymal abnormality.  Gallbladder surgically absent, accounting for the mild intra and extrahepatic biliary ductal dilation.  Normal appearing spleen, pancreas, adrenal glands, and kidneys.  Moderate aorto-iliofemoral atherosclerosis.  No significant lymphadenopathy.  Uterus surgically absent.  Urinary bladder with diffuse mild wall thickening.  Numerous pelvic phleboliths.  No adnexal masses or free pelvic fluid.  Bone window images demonstrate solid Ray cage fusion at L4-5, degenerative changes involving the facet joints of the lower lumbar spine, and a Schmorl's node in the upper endplate of 624THL. Visualized lung bases clear.  Heart size normal with apparent prior LAD and  diagonal coronary stents.  IMPRESSION:  1. Circumferential wall thickening involving the second portion of the duodenum over a long segment, most likely secondary to duodenitis.  Duodenal carcinoma is possible but felt less likely. 2.  Feeding tube looped in the fourth portion of the duodenum with its tip back in the third portion of the duodenum. 3.  No evidence of bowel obstruction. 4.  Mild diffuse thickening of the wall of the urinary bladder. This is nonspecific and can be seen with cystitis or chronic outlet obstruction. 5.  Mild diffuse hepatic steatosis without focal hepatic parenchymal abnormality. 6.  Moderate  aorto-iliofemoral atherosclerosis which is quite advanced for age.   Original Report Authenticated By: Evangeline Dakin, M.D.    3:50 PM pain and nausea improved after treatment with hydromorphone and Phenergan 4:25 PM pain worsening. Additional hydromorphone and pro tonix ordered. MDM  Spoke with Dr.Myers. Plan admit pain control intravenous fluids glycemic control Diagnosis #1 abdominal pain #2 hyperglycemia #3 hyponatremia        Orlie Dakin, MD 12/24/12 1659  Orlie Dakin, MD 12/24/12 1702

## 2012-12-24 NOTE — H&P (Signed)
[facility] History and Physical  CARDELL ROTOLO [id number] DOB: 1970-12-16 DOA: 12/24/2012  Referring physician: ED physician PCP: [name], MD   Chief Complaint: Nausea and vomiting  HPI:  Pt is 42 yo female with multiple and complex medical history including recurrent abdominal pain which was determined to be multifactorial in etiology and secondary to diabetic gastroparesis, narcotic use, chronic pancreatitis. She was admitted last 02/24/2102 and has had jejunostomy tube placed for feeding (03/09/2012), has been hospitalized at [facility] since that time and we have no records available. She is now presenting with 2-3 days worsening abdominal pain, constant, non radiating, 10/10 in severity, associated with nausea and non bloody vomiting, unable to tolerate medications. She denies fevers, chills, no specific urinary concerns. Pt denies any specific alleviating or aggravating factors.   In ED, pt with persistent vomiting, CT abdomen and pelvis with ? Duodenitis. [facility] asked to admit for further evaluation and management,   Assessment and Plan:  Principal Problem:   Nausea and vomiting - unclear etiology and possibly related to duodenitis as noted on CT abdomen and pelvis, in pt that already has history of gastroparesis  - will admit for further evaluation and will start with providing supportive care, analgesia and antiemetics as needed - will also provide IVF for now, PPI via IV - consider GI input in AM, they have seen her previously and had no further recommendations other than supportive care  Active Problems:   Gastroparesis - antiemetics as need,    DM (diabetes mellitus), type 2, uncontrolled - will continue home medical regimen, also add SSI   Hyponatremia - likely of pre renal etiology and secondary to dehydration - will provide IVF as noted above   GERD (gastroesophageal reflux disease) - change PPI to protonix BID IV   Hypertension, uncontrolled - continue  Lisinopril and Metoprolol, will add Hydralazine    Leukocytosis, unspecified - likely secondary to demargination, stress - pt afebrile, will repeat CBC in AM  Code Status: Full Family Communication: Pt at bedside Disposition Plan: Admit to telemetry floor   Review of Systems:  Constitutional: Negative for fever, chills and malaise/fatigue. Negative for diaphoresis.  HENT: Negative for hearing loss, ear pain, nosebleeds, congestion, sore throat, neck pain, tinnitus and ear discharge.   Eyes: Negative for blurred vision, double vision, photophobia, pain, discharge and redness.  Respiratory: Negative for cough, hemoptysis, sputum production, shortness of breath, wheezing and stridor.   Cardiovascular: Negative for chest pain, palpitations, orthopnea, claudication and leg swelling.  Gastrointestinal: Positive for nausea, vomiting and abdominal pain. Negative for heartburn, constipation, blood in stool and melena.  Genitourinary: Negative for dysuria, urgency, frequency, hematuria and flank pain.  Musculoskeletal: Negative for myalgias, back pain, joint pain and falls.  Skin: Negative for itching and rash.  Neurological: Negative for dizziness and weakness. Negative for tingling, tremors, sensory change, speech change, focal weakness, loss of consciousness and headaches.  Endo/Heme/Allergies: Negative for environmental allergies and polydipsia. Does not bruise/bleed easily.  Psychiatric/Behavioral: Negative for suicidal ideas. The patient is not nervous/anxious.      Past Medical History  Diagnosis Date  . Hypertension   . Diabetes mellitus   . Gastroparesis   . Asthma   . GERD (gastroesophageal reflux disease)   . Coronary artery disease   . h/o seizure   . Neuropathy   . Chronic pancreatitis   . Dyslipidemia   . MI (myocardial infarction)   . COPD (chronic obstructive pulmonary disease)     Past Surgical History  Procedure  Laterality Date  . Abdominal hysterectomy    .  Cholecystectomy    . Peg tube x 4      feeding jejunostomies with PEG tubes  . Cesarean section    . Colon resection due to diverticulitis    . Back surgery      Social History:  reports that she has been smoking Cigarettes.  She has a .5 pack-year smoking history. She has never used smokeless tobacco. She reports that she does not drink alcohol or use illicit drugs.  Allergies  Allergen Reactions  . Penicillins Hives  . Compazine Hives  . Darvocet (Propoxyphene-Acetaminophen) Other (See Comments)    UNKNOWN  . Nsaids Hives  . Zofran Hives    Family History  Problem Relation Age of Onset  . Cancer Mother     Prior to Admission medications   Medication Sig Start Date End Date Taking? Authorizing Provider  atorvastatin (LIPITOR) 40 MG tablet Take 1 tablet (40 mg total) by mouth daily. 12/02/11 12/01/12  Robbie Lis, MD  docusate sodium (COLACE) 100 MG capsule Take 100 mg by mouth 2 (two) times daily.    Historical Provider, MD  esomeprazole (NEXIUM) 40 MG capsule Take 1 capsule (40 mg total) by mouth daily before breakfast. 12/02/11   Robbie Lis, MD  Fluticasone-Salmeterol (ADVAIR) 250-50 MCG/DOSE AEPB Inhale 1 puff into the lungs 2 (two) times daily. 12/02/11   Robbie Lis, MD  gabapentin (NEURONTIN) 600 MG tablet Take 1 tablet (600 mg total) by mouth 3 (three) times daily. 12/02/11 12/01/12  Robbie Lis, MD  insulin glargine (LANTUS) 100 UNIT/ML injection Inject 10 Units into the skin at bedtime. 12/02/11 12/01/12  Robbie Lis, MD  insulin lispro (HUMALOG) 100 UNIT/ML injection Inject 10-20 Units into the skin 3 (three) times daily before meals. Sliding scale 12/02/11   Robbie Lis, MD  lisinopril (PRINIVIL) 10 MG tablet Take 2 tablets (20 mg total) by mouth daily. 12/02/11 12/01/12  Robbie Lis, MD  metoprolol succinate (TOPROL XL) 50 MG 24 hr tablet Take 1 tablet (50 mg total) by mouth daily. 12/02/11 12/01/12  Robbie Lis, MD  Multiple Vitamin (MULITIVITAMIN WITH MINERALS) TABS Take 1  tablet by mouth daily. 12/02/11   Robbie Lis, MD  Nutritional Supplements (FEEDING SUPPLEMENT, OSMOLITE 1.2 CAL,) LIQD Place 1,000 mLs into feeding tube continuous. 58ml/hr continuously for 90 days 03/14/12   Bonnielee Haff, MD  UNABLE TO FIND Free Water: 120 ml 5 times a day Pump for tube feeding IV Pole Clamp Bags 03/14/12   Bonnielee Haff, MD  zolpidem (AMBIEN) 5 MG tablet Take 1 tablet (5 mg total) by mouth at bedtime as needed for sleep (insomnia). 12/02/11 12/01/12  Robbie Lis, MD    Physical Exam: Filed Vitals:   12/24/12 1237 12/24/12 1422  BP: 173/112 170/100  Pulse: 116 112  Temp: 98.3 F (36.8 C)   Resp: 22 18  SpO2: 100% 97%    Physical Exam  Constitutional: Appears well-developed and well-nourished. In mild distress due to pain  HENT: Normocephalic. External right and left ear normal. Oropharynx is clear and moist.  Eyes: Conjunctivae and EOM are normal. PERRLA, no scleral icterus.  Neck: Normal ROM. Neck supple. No JVD. No tracheal deviation. No thyromegaly.  CVS: Regular rhythm, tachycardic, S1/S2 +, no murmurs, no gallops, no carotid bruit.  Pulmonary: Effort and breath sounds normal, no stridor, rhonchi, wheezes, rales.  Abdominal: Soft. BS +,  Slightly distended with tenderness in  epigastric area and voluntary guarding Musculoskeletal: Normal range of motion. No edema and no tenderness.  Lymphadenopathy: No lymphadenopathy noted, cervical, inguinal. Neuro: Alert. Normal reflexes, muscle tone coordination. No cranial nerve deficit. Skin: Skin is warm and dry. No rash noted. Not diaphoretic. No erythema. No pallor.  Psychiatric: Normal mood and affect. Behavior, judgment, thought content normal.   Labs on Admission:  Basic Metabolic Panel:  Recent Labs Lab 12/24/12 1240  NA 127*  K 4.3  CL 87*  CO2 27  GLUCOSE 394*  BUN 16  CREATININE 0.56  CALCIUM 9.6   Liver Function Tests:  Recent Labs Lab 12/24/12 1240  AST 27  ALT 14  ALKPHOS 128*  BILITOT  0.2*  PROT 8.3  ALBUMIN 2.9*    Recent Labs Lab 12/24/12 1240  LIPASE 22   CBC:  Recent Labs Lab 12/24/12 1240  WBC 15.7*  NEUTROABS 13.6*  HGB 13.2  HCT 37.4  MCV 86.2  PLT 298    Radiological Exams on Admission: Ct Abdomen Pelvis W Contrast 12/24/2012   1. Circumferential wall thickening involving the second portion of the duodenum over a long segment, likely secondary to duodenitis.  Duodenal carcinoma is possible but felt less likely.  2.  Feeding tube looped in the fourth portion of the duodenum with its tip back in the third portion of the duodenum.  3.  No evidence of bowel obstruction.  4.  Mild diffuse thickening of the wall of the urinary bladder. This is nonspecific and can be seen with cystitis or chronic outlet obstruction.  5.  Mild diffuse hepatic steatosis without focal hepatic parenchymal abnormality.  6.  Moderate aorto-iliofemoral atherosclerosis which is quite advanced for age.    EKG: Normal sinus rhythm, no ST/T wave changes  [name], MD  [facility] Pager [phone]  If 7PM-7AM, please contact night-coverage [email or web address] Password [facility] 12/24/2012, 5:31 PM

## 2012-12-24 NOTE — ED Notes (Signed)
Unsuccessfully attempted to obtain blood for labs.RN made aware 

## 2012-12-24 NOTE — ED Notes (Signed)
Looked for blood draw access. Unable to find anything thing. Notified Dr Mechele Claude.

## 2012-12-25 DIAGNOSIS — E860 Dehydration: Secondary | ICD-10-CM

## 2012-12-25 DIAGNOSIS — R109 Unspecified abdominal pain: Secondary | ICD-10-CM

## 2012-12-25 DIAGNOSIS — K5900 Constipation, unspecified: Secondary | ICD-10-CM

## 2012-12-25 LAB — CBC
Platelets: 206 10*3/uL (ref 150–400)
RBC: 3.63 MIL/uL — ABNORMAL LOW (ref 3.87–5.11)
RDW: 15.2 % (ref 11.5–15.5)
WBC: 11 10*3/uL — ABNORMAL HIGH (ref 4.0–10.5)

## 2012-12-25 LAB — BASIC METABOLIC PANEL
Calcium: 8.1 mg/dL — ABNORMAL LOW (ref 8.4–10.5)
GFR calc non Af Amer: 56 mL/min — ABNORMAL LOW (ref >90)
Sodium: 132 mEq/L — ABNORMAL LOW (ref 135–145)

## 2012-12-25 LAB — GLUCOSE, CAPILLARY
Glucose-Capillary: 259 mg/dL — ABNORMAL HIGH (ref 70–99)
Glucose-Capillary: 235 mg/dL — ABNORMAL HIGH (ref 70–99)
Glucose-Capillary: 134 mg/dL — ABNORMAL HIGH (ref 70–99)

## 2012-12-25 MED ORDER — HYDROMORPHONE HCL PF 2 MG/ML IJ SOLN
2.0000 mg | INTRAMUSCULAR | Status: DC | PRN
  Administered 2012-12-25 – 2012-12-28 (×24): 2 mg via INTRAVENOUS
  Filled 2012-12-25 (×25): qty 1

## 2012-12-25 MED ORDER — SODIUM CHLORIDE 0.9 % IV SOLN: INTRAVENOUS | Status: DC

## 2012-12-25 MED ORDER — ALBUTEROL SULFATE (5 MG/ML) 0.5% IN NEBU
2.5000 mg | INHALATION_SOLUTION | Freq: Four times a day (QID) | RESPIRATORY_TRACT | Status: DC | PRN
  Administered 2012-12-25: 2.5 mg via RESPIRATORY_TRACT
  Filled 2012-12-25: qty 0.5

## 2012-12-25 MED ORDER — SODIUM CHLORIDE 0.9 % IV BOLUS (SEPSIS)
500.0000 mL | Freq: Once | INTRAVENOUS | Status: AC
  Administered 2012-12-25: 500 mL via INTRAVENOUS

## 2012-12-25 NOTE — Progress Notes (Signed)
Patient ID: [name], female   DOB: 1970/08/11, 42 y.o.   MRN: [id number]  [facility] PROGRESS NOTE  TAELYN KNAPPER [id number] DOB: [birth year] DOA: 12/24/2012 PCP: [name], MD  Brief narrative:  Pt is 42 yo female with multiple and complex medical history including recurrent abdominal pain which was determined to be multifactorial in etiology and secondary to diabetic gastroparesis, narcotic use, chronic pancreatitis. She was admitted last 02/24/2102 and has had jejunostomy tube placed for feeding (03/09/2012), has been hospitalized at [facility] since that time and we have no records available. She is now presenting with 2-3 days worsening abdominal pain, constant, non radiating, 10/10 in severity, associated with nausea and non bloody vomiting, unable to tolerate medications. She denies fevers, chills, no specific urinary concerns. Pt denies any specific alleviating or aggravating factors.   In ED, pt with persistent vomiting, CT abdomen and pelvis with ? Duodenitis. [facility] asked to admit for further evaluation and management,   Assessment and Plan:  Principal Problem:  Nausea and vomiting  - unclear etiology and possibly related to duodenitis as noted on CT abdomen and pelvis, in pt that already has history of gastroparesis  - pt is still reporting nausea but less pain  - continue providing supportive care, analgesia and antiemetics as needed  - continue IVF for now, PPI via IV  - consider GI input in AM, they have seen her previously and had no further recommendations other than supportive care  Active Problems:  Gastroparesis  - antiemetics as need,  DM (diabetes mellitus), type 2, uncontrolled  - will continue home medical regimen, also SSI  Hyponatremia  - likely of pre renal etiology and secondary to dehydration, now improving  - continue IVF as noted above  GERD (gastroesophageal reflux disease)  - change PPI to protonix BID IV  Hypertension, uncontrolled  - pt  started on Lisinopril and Metoprolol, BP soft today - will hold off on Lisinopril due to additional acute renal failure Acute renal failure - likely pre renal, continue IVF, discontinue Lisinopril for now, BMP in AM  Leukocytosis, unspecified  - likely secondary to demargination, stress  - pt afebrile, WBC trending down   Consultants:  Diabetic educator  Nutritionist  Procedures/Studies: Ct Abdomen Pelvis W Contrast 12/24/2012   1. Circumferential wall thickening involving the second portion of the duodenum over a long segment, likely secondary to duodenitis.  Duodenal carcinoma is possible but felt less likely.  2.  Feeding tube looped in the fourth portion of the duodenum with its tip back in the third portion of the duodenum.  3.  No evidence of bowel obstruction.  4.  Mild diffuse thickening of the wall of the urinary bladder. This is nonspecific and can be seen with cystitis or chronic outlet obstruction.  5.  Mild diffuse hepatic steatosis without focal hepatic parenchymal abnormality.  6.  Moderate aorto-iliofemoral atherosclerosis which is quite advanced for age.   Marland Kitchen   Antibiotics:  one  Code Status: Full Family Communication: Pt at bedside Disposition Plan: Home when medically stable  HPI/Subjective: No events overnight.   Objective: Filed Vitals:   12/25/12 1403 12/25/12 1547 12/25/12 1825 12/25/12 2055  BP: 126/82 92/50 100/60   Pulse: 88     Temp: 98 F (36.7 C)     TempSrc: Oral     Resp: 18     Weight:  67.359 kg (148 lb 8 oz)    SpO2: 100%   92%    Intake/Output Summary (Last 24  hours) at 12/25/12 2206 Last data filed at 12/25/12 0855  Gross per 24 hour  Intake   1860 ml  Output      0 ml  Net   1860 ml    Exam:   General:  Pt is alert, follows commands appropriately, not in acute distress  Cardiovascular: Regular rate and rhythm, S1/S2, no murmurs, no rubs, no gallops  Respiratory: Clear to auscultation bilaterally, no wheezing, no  crackles, no rhonchi  Abdomen: Soft, tender in epigastric area, slightly distended, bowel sounds present, no guarding  Extremities: No edema, pulses DP and PT palpable bilaterally  Neuro: Grossly nonfocal  Data Reviewed: Basic Metabolic Panel:  Recent Labs Lab 12/24/12 1240 12/25/12 0545  NA 127* 132*  K 4.3 3.6  CL 87* 95*  CO2 27 25  GLUCOSE 394* 257*  BUN 16 20  CREATININE 0.56 1.18*  CALCIUM 9.6 8.1*   Liver Function Tests:  Recent Labs Lab 12/24/12 1240  AST 27  ALT 14  ALKPHOS 128*  BILITOT 0.2*  PROT 8.3  ALBUMIN 2.9*    Recent Labs Lab 12/24/12 1240  LIPASE 22   CBC:  Recent Labs Lab 12/24/12 1240 12/25/12 0545  WBC 15.7* 11.0*  NEUTROABS 13.6*  --   HGB 13.2 10.6*  HCT 37.4 31.7*  MCV 86.2 87.3  PLT 298 206   CBG:  Recent Labs Lab 12/24/12 1859 12/24/12 2112 12/25/12 0725 12/25/12 1156 12/25/12 1631  GLUCAP 330* 220* 259* 235* 136*   Scheduled Meds: . atorvastatin  40 mg Oral q1800  . docusate sodium  100 mg Oral BID  . Enoxaparin injection  30 mg Subcutaneous Q24H  . gabapentin  600 mg Oral TID  . insulin aspart  0-15 Units Subcutaneous TID WC  . insulin glargine  10 Units Subcutaneous QHS  . lisinopril  20 mg Oral Daily  . metoprolol tartrate  25 mg Oral BID  . mometasone-formoterol  2 puff Inhalation BID  . pantoprazole IV  40 mg Intravenous Q12H   Continuous Infusions: . feeding supplement (OSMOLITE 1.2 CAL) 1,000 mL (12/25/12 1416)   [name], MD  [place] Pager [phone]  If 7PM-7AM, please contact night-coverage [email or web address] Password [email or web address] 12/25/2012, 10:06 PM   LOS: 1 day

## 2012-12-25 NOTE — Progress Notes (Signed)
INITIAL NUTRITION ASSESSMENT  DOCUMENTATION CODES Per approved criteria  -Not Applicable   INTERVENTION: - Recommend nursing re-weigh pt - Recommend resuming home TF schedule once diet advanced of nighttime TF of Osmolite 1.2 at 198ml/hr from 9p-5am - Will continue to monitor   NUTRITION DIAGNOSIS: Inadequate oral intake related to inability to eat as evidenced by NPO.    Goal: 1. TF tolerance and to meet >90% of estimated nutritional needs 2. Advance diet as tolerated to diabetic diet  Monitor:  Weights, labs, diet advancement, TF tolerance   Reason for Assessment: Home TF, nutrition risk   42 y.o. female  Admitting Dx: Nausea and vomiting  ASSESSMENT: Pt known to RD from previous admission. Pt with history of recurrent abdominal pain, narcotic use, and chronic pancreatitis. Pt has a jejunostomy tube which was placed last year. Pt reports at home using J-tube only at night from 9pm-5am running Osmolite 1.2 at a rate of 164ml/hr which provides 960 calories, 44g protein. Pt reports typically eating 1 meal/day which she states is well balanced. Pt reports her nausea started 2 weeks ago. Noted pt's weight up 20 pounds in the past 9 months. Pt currently getting Osmolite 1.2 at 42ml/hr continuously which provides 1728 calories, 80g protein which meets 119% estimated calorie needs, 133% estimated protein needs.   Height: Ht Readings from Last 1 Encounters:  02/25/12 5\' 4"  (1.626 m)    Weight: Wt Readings from Last 1 Encounters:  12/25/12 148 lb 8 oz (67.359 kg)    Ideal Body Weight: 120 lb  % Ideal Body Weight: 123  Wt Readings from Last 10 Encounters:  12/25/12 148 lb 8 oz (67.359 kg)  03/14/12 128 lb 1.4 oz (58.1 kg)  03/14/12 128 lb 1.4 oz (58.1 kg)  03/14/12 128 lb 1.4 oz (58.1 kg)  03/14/12 128 lb 1.4 oz (58.1 kg)  03/14/12 128 lb 1.4 oz (58.1 kg)  03/14/12 128 lb 1.4 oz (58.1 kg)  02/12/12 131 lb 6.3 oz (59.6 kg)  12/01/11 139 lb 12.4 oz (63.4 kg)  08/09/11 133  lb 12.8 oz (60.691 kg)    Usual Body Weight: 128 lb in August 2013  % Usual Body Weight: 116  BMI:  Body mass index is 25.48 kg/(m^2).  Estimated Nutritional Needs: Kcal: 1450-1650 Protein: 60-70g Fluid: 1.4-1.6L/day   Skin: Intact   Diet Order: NPO  EDUCATION NEEDS: -No education needs identified at this time   Intake/Output Summary (Last 24 hours) at 12/25/12 1612 Last data filed at 12/25/12 0855  Gross per 24 hour  Intake   1860 ml  Output      0 ml  Net   1860 ml    Last BM: PTA  Labs:   Recent Labs Lab 12/24/12 1240 12/25/12 0545  NA 127* 132*  K 4.3 3.6  CL 87* 95*  CO2 27 25  BUN 16 20  CREATININE 0.56 1.18*  CALCIUM 9.6 8.1*  GLUCOSE 394* 257*    CBG (last 3)   Recent Labs  12/24/12 2112 12/25/12 0725 12/25/12 1156  GLUCAP 220* 259* 235*    Scheduled Meds: . atorvastatin  40 mg Oral q1800  . docusate sodium  100 mg Oral BID  . enoxaparin (LOVENOX) injection  30 mg Subcutaneous Q24H  . gabapentin  600 mg Oral TID  . hydrALAZINE  10 mg Intravenous Q6H  . insulin aspart  0-15 Units Subcutaneous TID WC  . insulin glargine  10 Units Subcutaneous QHS  . lisinopril  20 mg Oral Daily  .  metoprolol tartrate  25 mg Oral BID  . mometasone-formoterol  2 puff Inhalation BID  . multivitamin with minerals  1 tablet Oral Daily  . pantoprazole (PROTONIX) IV  40 mg Intravenous Q12H  . sodium chloride  3 mL Intravenous Q12H    Continuous Infusions: . feeding supplement (OSMOLITE 1.2 CAL) 1,000 mL (12/25/12 1416)    Past Medical History  Diagnosis Date  . Hypertension   . Diabetes mellitus   . Gastroparesis   . Asthma   . GERD (gastroesophageal reflux disease)   . Coronary artery disease   . h/o seizure   . Neuropathy   . Chronic pancreatitis   . Dyslipidemia   . MI (myocardial infarction)   . COPD (chronic obstructive pulmonary disease)     Past Surgical History  Procedure Laterality Date  . Abdominal hysterectomy    .  Cholecystectomy    . Peg tube x 4      feeding jejunostomies with PEG tubes  . Cesarean section    . Colon resection due to diverticulitis    . Back surgery       Mikey College MS, [place], [place] Pager [phone] After Hours Pager

## 2012-12-25 NOTE — Progress Notes (Signed)
Inpatient Diabetes Program Recommendations  AACE/ADA: New Consensus Statement on Inpatient Glycemic Control (2013)  Target Ranges:  Prepandial:   less than 140 mg/dL      Peak postprandial:   less than 180 mg/dL (1-2 hours)      Critically ill patients:  140 - 180 mg/dL   Reason for Visit: Results for RACHALE, ANGELUCCI (MRN [id number]) as of 12/25/2012 12:42  Ref. Range 12/24/2012 18:59 12/24/2012 21:12 12/25/2012 07:25 12/25/2012 11:56  Glucose-Capillary Latest Range: 70-99 mg/dL 330 (H) 220 (H) 259 (H) 235 (H)   Patient is currently receiving Osmolite 60 cc per hour continuously.  Please increase Novolog correction to q 4 hours.  Also please check A1C.   Adah Perl, RN, MSN, BC-ADM Diabetes Cooridinator

## 2012-12-26 ENCOUNTER — Inpatient Hospital Stay (HOSPITAL_COMMUNITY): Payer: Medicare Other

## 2012-12-26 DIAGNOSIS — N179 Acute kidney failure, unspecified: Secondary | ICD-10-CM

## 2012-12-26 LAB — CBC
HCT: 30.7 % — ABNORMAL LOW (ref 36.0–46.0)
MCH: 29 pg (ref 26.0–34.0)
MCV: 90 fL (ref 78.0–100.0)
Platelets: 197 10*3/uL (ref 150–400)
RBC: 3.41 MIL/uL — ABNORMAL LOW (ref 3.87–5.11)
RDW: 15.6 % — ABNORMAL HIGH (ref 11.5–15.5)

## 2012-12-26 LAB — BASIC METABOLIC PANEL
BUN: 33 mg/dL — ABNORMAL HIGH (ref 6–23)
CO2: 27 mEq/L (ref 19–32)
Calcium: 8.1 mg/dL — ABNORMAL LOW (ref 8.4–10.5)
Creatinine, Ser: 2.34 mg/dL — ABNORMAL HIGH (ref 0.50–1.10)

## 2012-12-26 LAB — GLUCOSE, CAPILLARY
Glucose-Capillary: 221 mg/dL — ABNORMAL HIGH (ref 70–99)
Glucose-Capillary: 226 mg/dL — ABNORMAL HIGH (ref 70–99)

## 2012-12-26 MED ORDER — OSMOLITE 1.2 CAL PO LIQD: 1000.0000 mL | ORAL | Status: DC

## 2012-12-26 MED ORDER — SODIUM CHLORIDE 0.9 % IV SOLN
INTRAVENOUS | Status: DC
  Administered 2012-12-26 – 2012-12-27 (×3): via INTRAVENOUS

## 2012-12-26 NOTE — Consult Note (Signed)
[facility] Consult Note  Referring Provider: No ref. provider found Primary Care Physician:  [name], MD Primary Gastroenterologist:  Dr.  Laurel Dimmer Complaint: Vomiting blood HPI: [name] is an 42 y.o. black female  with gastroparesis requiring chronic jejunal feedings who experiences chronic nausea vomiting and abdominal pain nonetheless. She presented because of what she describes vomiting blood and worsening abdominal pain 2 days ago. She had a contrast CT scan which showed placement of the jejunal feeding tube and circumferential thickening along section of the second portion of the duodenum. This was not noted on a noncontrast CT scan done last year. The patient has not had any melena or observe vomiting since he's been in the hospital. She did have an EGD in August 2013 which showed what appeared to be fungal esophagitis but no duodenal abnormalities were mentioned.  Past Medical History  Diagnosis Date  . Hypertension   . Diabetes mellitus   . Gastroparesis   . Asthma   . GERD (gastroesophageal reflux disease)   . Coronary artery disease   . h/o seizure   . Neuropathy   . Chronic pancreatitis   . Dyslipidemia   . MI (myocardial infarction)   . COPD (chronic obstructive pulmonary disease)     Past Surgical History  Procedure Laterality Date  . Abdominal hysterectomy    . Cholecystectomy    . Peg tube x 4      feeding jejunostomies with PEG tubes  . Cesarean section    . Colon resection due to diverticulitis    . Back surgery      Medications Prior to Admission  Medication Sig Dispense Refill  . atorvastatin (LIPITOR) 40 MG tablet Take 1 tablet (40 mg total) by mouth daily.  30 tablet  0  . esomeprazole (NEXIUM) 40 MG capsule Take 1 capsule (40 mg total) by mouth daily before breakfast.  30 capsule  0  . Fluticasone-Salmeterol (ADVAIR) 250-50 MCG/DOSE AEPB Inhale 1 puff into the lungs 2 (two) times daily.  60 each  0  . insulin lispro (HUMALOG) 100  UNIT/ML injection Inject 10-20 Units into the skin 3 (three) times daily before meals. Sliding scale  10 mL  2  . metoCLOPramide (REGLAN) 10 MG tablet Take 10 mg by mouth 4 (four) times daily.      . Multiple Vitamin (MULITIVITAMIN WITH MINERALS) TABS Take 1 tablet by mouth daily.  30 tablet  0  . zolpidem (AMBIEN) 10 MG tablet Take 10 mg by mouth at bedtime as needed for sleep.      Marland Kitchen docusate sodium (COLACE) 100 MG capsule Take 100 mg by mouth 2 (two) times daily.      Marland Kitchen gabapentin (NEURONTIN) 600 MG tablet Take 1 tablet (600 mg total) by mouth 3 (three) times daily.  90 tablet  0  . insulin glargine (LANTUS) 100 UNIT/ML injection Inject 10 Units into the skin at bedtime.  3 mL  4  . lisinopril (PRINIVIL) 10 MG tablet Take 2 tablets (20 mg total) by mouth daily.  60 tablet  0  . metoprolol succinate (TOPROL XL) 50 MG 24 hr tablet Take 1 tablet (50 mg total) by mouth daily.  30 tablet  0  . Nutritional Supplements (FEEDING SUPPLEMENT, OSMOLITE 1.2 CAL,) LIQD Place 1,000 mLs into feeding tube continuous. 67ml/hr continuously for 90 days  1000 mL  10  . UNABLE TO FIND Free Water: 120 ml 5 times a day Pump for tube feeding IV Pole Clamp Bags  1  each  0    Allergies:  Allergies  Allergen Reactions  . Penicillins Hives  . Cefadroxil     Not sure of effects-listed as allergy on list from home  . Compazine Hives  . Darvocet (Propoxyphene-Acetaminophen) Other (See Comments)    UNKNOWN  . Nsaids Hives  . Prochlorperazine     Not sure of effects-listed as allergy on list from home  . Zofran Hives  . Zofran (Ondansetron) Hives    Family History  Problem Relation Age of Onset  . Cancer Mother     Social History:  reports that she has been smoking Cigarettes.  She has a .5 pack-year smoking history. She has never used smokeless tobacco. She reports that she does not drink alcohol or use illicit drugs.  Review of Systems: negative except for chronic nausea and abdominal pain requiring  Dilaudid.   Blood pressure 101/64, pulse 88, temperature 98.6 F (37 C), temperature source Oral, resp. rate 18, height 5\' 4"  (1.626 m), weight 68.947 kg (152 lb), SpO2 93.00%. Head: Normocephalic, without obvious abnormality, atraumatic Neck: no adenopathy, no carotid bruit, no JVD, supple, symmetrical, trachea midline and thyroid not enlarged, symmetric, no tenderness/mass/nodules Resp: clear to auscultation bilaterally Cardio: regular rate and rhythm, S1, S2 normal, no murmur, click, rub or gallop GI: Abdomen diffusely tender to even light palpation, well-healed midline surgical scar Extremities: extremities normal, atraumatic, no cyanosis or edema  Results for orders placed during the hospital encounter of 12/24/12 (from the past 48 hour(s))  CBC WITH DIFFERENTIAL     Status: Abnormal   Collection Time    12/24/12 12:40 PM      Result Value Range   WBC 15.7 (*) 4.0 - 10.5 K/uL   RBC 4.34  3.87 - 5.11 MIL/uL   Hemoglobin 13.2  12.0 - 15.0 g/dL   HCT 37.4  36.0 - 46.0 %   MCV 86.2  78.0 - 100.0 fL   MCH 30.4  26.0 - 34.0 pg   MCHC 35.3  30.0 - 36.0 g/dL   RDW 14.7  11.5 - 15.5 %   Platelets 298  150 - 400 K/uL   Neutrophils Relative % 87 (*) 43 - 77 %   Lymphocytes Relative 10 (*) 12 - 46 %   Monocytes Relative 3  3 - 12 %   Eosinophils Relative 0  0 - 5 %   Basophils Relative 0  0 - 1 %   Neutro Abs 13.6 (*) 1.7 - 7.7 K/uL   Lymphs Abs 1.6  0.7 - 4.0 K/uL   Monocytes Absolute 0.5  0.1 - 1.0 K/uL   Eosinophils Absolute 0.0  0.0 - 0.7 K/uL   Basophils Absolute 0.0  0.0 - 0.1 K/uL   Smear Review LARGE PLATELETS PRESENT    COMPREHENSIVE METABOLIC PANEL     Status: Abnormal   Collection Time    12/24/12 12:40 PM      Result Value Range   Sodium 127 (*) 135 - 145 mEq/L   Potassium 4.3  3.5 - 5.1 mEq/L   Comment: HEMOLYSIS AT THIS LEVEL MAY AFFECT RESULT   Chloride 87 (*) 96 - 112 mEq/L   CO2 27  19 - 32 mEq/L   Glucose, Bld 394 (*) 70 - 99 mg/dL   BUN 16  6 - 23 mg/dL    Creatinine, Ser 0.56  0.50 - 1.10 mg/dL   Calcium 9.6  8.4 - 10.5 mg/dL   Total Protein 8.3  6.0 - 8.3 g/dL  Albumin 2.9 (*) 3.5 - 5.2 g/dL   AST 27  0 - 37 U/L   ALT 14  0 - 35 U/L   Alkaline Phosphatase 128 (*) 39 - 117 U/L   Total Bilirubin 0.2 (*) 0.3 - 1.2 mg/dL   GFR calc non Af Amer >90  >90 mL/min   GFR calc Af Amer >90  >90 mL/min   Comment:            The eGFR has been calculated     using the CKD EPI equation.     This calculation has not been     validated in all clinical     situations.     eGFR's persistently     <90 mL/min signify     possible Chronic Kidney Disease.  LIPASE, BLOOD     Status: None   Collection Time    12/24/12 12:40 PM      Result Value Range   Lipase 22  11 - 59 U/L  URINALYSIS, ROUTINE W REFLEX MICROSCOPIC     Status: Abnormal   Collection Time    12/24/12  5:25 PM      Result Value Range   Color, Urine YELLOW  YELLOW   APPearance CLEAR  CLEAR   Specific Gravity, Urine >1.046 (*) 1.005 - 1.030   pH 7.0  5.0 - 8.0   Glucose, UA >1000 (*) NEGATIVE mg/dL   Hgb urine dipstick SMALL (*) NEGATIVE   Bilirubin Urine NEGATIVE  NEGATIVE   Ketones, ur NEGATIVE  NEGATIVE mg/dL   Protein, ur >300 (*) NEGATIVE mg/dL   Urobilinogen, UA 0.2  0.0 - 1.0 mg/dL   Nitrite NEGATIVE  NEGATIVE   Leukocytes, UA NEGATIVE  NEGATIVE  URINE MICROSCOPIC-ADD ON     Status: Abnormal   Collection Time    12/24/12  5:25 PM      Result Value Range   RBC / HPF 7-10  <3 RBC/hpf   Casts GRANULAR CAST (*) NEGATIVE  GLUCOSE, CAPILLARY     Status: Abnormal   Collection Time    12/24/12  6:59 PM      Result Value Range   Glucose-Capillary 330 (*) 70 - 99 mg/dL  GLUCOSE, CAPILLARY     Status: Abnormal   Collection Time    12/24/12  9:12 PM      Result Value Range   Glucose-Capillary 220 (*) 70 - 99 mg/dL   Comment 1 Notify RN    BASIC METABOLIC PANEL     Status: Abnormal   Collection Time    12/25/12  5:45 AM      Result Value Range   Sodium 132 (*) 135 - 145  mEq/L   Potassium 3.6  3.5 - 5.1 mEq/L   Chloride 95 (*) 96 - 112 mEq/L   Comment: RESULT REPEATED AND VERIFIED     DELTA CHECK NOTED   CO2 25  19 - 32 mEq/L   Glucose, Bld 257 (*) 70 - 99 mg/dL   BUN 20  6 - 23 mg/dL   Creatinine, Ser 1.18 (*) 0.50 - 1.10 mg/dL   Comment: RESULT REPEATED AND VERIFIED     DELTA CHECK NOTED   Calcium 8.1 (*) 8.4 - 10.5 mg/dL   GFR calc non Af Amer 56 (*) >90 mL/min   GFR calc Af Amer 64 (*) >90 mL/min   Comment:            The eGFR has been calculated  using the CKD EPI equation.     This calculation has not been     validated in all clinical     situations.     eGFR's persistently     <90 mL/min signify     possible Chronic Kidney Disease.  CBC     Status: Abnormal   Collection Time    12/25/12  5:45 AM      Result Value Range   WBC 11.0 (*) 4.0 - 10.5 K/uL   RBC 3.63 (*) 3.87 - 5.11 MIL/uL   Hemoglobin 10.6 (*) 12.0 - 15.0 g/dL   Comment: RESULT REPEATED AND VERIFIED     DELTA CHECK NOTED   HCT 31.7 (*) 36.0 - 46.0 %   MCV 87.3  78.0 - 100.0 fL   MCH 28.7  26.0 - 34.0 pg   MCHC 32.8  30.0 - 36.0 g/dL   RDW 15.2  11.5 - 15.5 %   Platelets 206  150 - 400 K/uL   Comment: RESULT REPEATED AND VERIFIED     DELTA CHECK NOTED  GLUCOSE, CAPILLARY     Status: Abnormal   Collection Time    12/25/12  7:25 AM      Result Value Range   Glucose-Capillary 259 (*) 70 - 99 mg/dL   Comment 1 Notify RN    GLUCOSE, CAPILLARY     Status: Abnormal   Collection Time    12/25/12 11:56 AM      Result Value Range   Glucose-Capillary 235 (*) 70 - 99 mg/dL   Comment 1 Notify RN    GLUCOSE, CAPILLARY     Status: Abnormal   Collection Time    12/25/12  4:31 PM      Result Value Range   Glucose-Capillary 136 (*) 70 - 99 mg/dL   Comment 1 Notify RN    GLUCOSE, CAPILLARY     Status: Abnormal   Collection Time    12/25/12 10:20 PM      Result Value Range   Glucose-Capillary 134 (*) 70 - 99 mg/dL  CBC     Status: Abnormal   Collection Time     12/26/12  5:05 AM      Result Value Range   WBC 8.4  4.0 - 10.5 K/uL   RBC 3.41 (*) 3.87 - 5.11 MIL/uL   Hemoglobin 9.9 (*) 12.0 - 15.0 g/dL   HCT 30.7 (*) 36.0 - 46.0 %   MCV 90.0  78.0 - 100.0 fL   MCH 29.0  26.0 - 34.0 pg   MCHC 32.2  30.0 - 36.0 g/dL   RDW 15.6 (*) 11.5 - 15.5 %   Platelets 197  150 - 400 K/uL  BASIC METABOLIC PANEL     Status: Abnormal   Collection Time    12/26/12  5:05 AM      Result Value Range   Sodium 134 (*) 135 - 145 mEq/L   Potassium 3.7  3.5 - 5.1 mEq/L   Chloride 99  96 - 112 mEq/L   CO2 27  19 - 32 mEq/L   Glucose, Bld 207 (*) 70 - 99 mg/dL   BUN 33 (*) 6 - 23 mg/dL   Comment: RESULT REPEATED AND VERIFIED     DELTA CHECK NOTED   Creatinine, Ser 2.34 (*) 0.50 - 1.10 mg/dL   Comment: RESULT REPEATED AND VERIFIED     DELTA CHECK NOTED   Calcium 8.1 (*) 8.4 - 10.5 mg/dL   GFR calc non Af  Amer 24 (*) >90 mL/min   GFR calc Af Amer 28 (*) >90 mL/min   Comment:            The eGFR has been calculated     using the CKD EPI equation.     This calculation has not been     validated in all clinical     situations.     eGFR's persistently     <90 mL/min signify     possible Chronic Kidney Disease.   Ct Abdomen Pelvis W Contrast  12/24/2012   *RADIOLOGY REPORT*  Clinical Data: Abdominal pain.  Nausea and vomiting.  Indwelling jejunal feeding tube.  Surgical history includes cholecystectomy, hysterectomy, and resection.  CT ABDOMEN AND PELVIS WITH CONTRAST  Technique:  Multidetector CT imaging of the abdomen and pelvis was performed following the standard protocol during bolus administration of intravenous contrast.  Contrast: 150mL OMNIPAQUE IOHEXOL 300 MG/ML IV. Oral contrast was also administered.  Comparison: CT abdomen and pelvis 02/26/2012.  Findings: Gastrostomy tube in the proximal body of the stomach without complicating features.  Coaxial small bowel feeding tube is looped in the fourth portion of the duodenum with its tip back and the distal third  portion of the duodenum.  Circumferential wall thickening involving the second portion of the duodenum, with luminal narrowing.  Small bowel anastomosis in the left upper quadrant is widely patent; mild dilation of this segment is secondary to the expected post operative atony.  Remaining small bowel normal in appearance.  Sigmoid colon extends upward into the left upper quadrant of the abdomen.  Entire colon decompressed and unremarkable.  Normal appendix in the right upper pelvis.  No ascites.  Mild diffuse hepatic steatosis without focal hepatic parenchymal abnormality.  Gallbladder surgically absent, accounting for the mild intra and extrahepatic biliary ductal dilation.  Normal appearing spleen, pancreas, adrenal glands, and kidneys.  Moderate aorto-iliofemoral atherosclerosis.  No significant lymphadenopathy.  Uterus surgically absent.  Urinary bladder with diffuse mild wall thickening.  Numerous pelvic phleboliths.  No adnexal masses or free pelvic fluid.  Bone window images demonstrate solid Ray cage fusion at L4-5, degenerative changes involving the facet joints of the lower lumbar spine, and a Schmorl's node in the upper endplate of 624THL. Visualized lung bases clear.  Heart size normal with apparent prior LAD and diagonal coronary stents.  IMPRESSION:  1. Circumferential wall thickening involving the second portion of the duodenum over a long segment, most likely secondary to duodenitis.  Duodenal carcinoma is possible but felt less likely. 2.  Feeding tube looped in the fourth portion of the duodenum with its tip back in the third portion of the duodenum. 3.  No evidence of bowel obstruction. 4.  Mild diffuse thickening of the wall of the urinary bladder. This is nonspecific and can be seen with cystitis or chronic outlet obstruction. 5.  Mild diffuse hepatic steatosis without focal hepatic parenchymal abnormality. 6.  Moderate aorto-iliofemoral atherosclerosis which is quite advanced for age.   Original  Report Authenticated By: Evangeline Dakin, M.D.    Assessment: Reported hematemesis with increasing abdominal pain and abnormal second portion of the duodenum by CT scan. Plan:   will need to be evaluated endoscopically. We'll plan EGD tomorrow.  Imad Shostak C 12/26/2012, 10:54 AM

## 2012-12-26 NOTE — Progress Notes (Addendum)
[facility] PROGRESS NOTE  [name] [id number] DOB: May 09, 1971 DOA: 12/24/2012 PCP: [name], MD  Assessment/Plan: Acute on chronic Abdominal pain, Nausea, vomiting: History of gastroparesis. CT scan show duodenitis. Will consult GI. Continue with protonix BID. Patient with prior history of fungal infection ?  Continue with tube feeding.  Hematemesis: Hb stable. Continue with protonix. [facility] consulted. Patient might benefit of endoscopy.  Acute renal Failure: multifactorial, contrast nephropathy, ATN in setting hypotension, decrease volume. Lisinopril discontinue 5-27. Discontinue all BP medications. IV fluids. Strict I and O. Check renal US.  Gastroparesis; PRN antiemetics.  DM (diabetes mellitus), type 2, uncontrolled: Continue with lantus and SSI.  Hyponatremia  - ln setting renal failure.  - continue IVF GERD (gastroesophageal reflux disease)  - change PPI to protonix BID IV  Hypertension, uncontrolled  - hold BP medications. SBP soft.   Leukocytosis, unspecified  - pt afebrile, WBC trending down   Code Status: Full Family Communication:  Disposition Plan: Remain inpatient.    Consultants:  [facility]  Procedures:  none  Antibiotics:  None  HPI/Subjective: Still with abdominal pain, 8/10. she vomit blood the day of admission. Still with nausea. No BM today. Had BM on admission.   Objective: Filed Vitals:   12/25/12 2055 12/25/12 2200 12/26/12 0700 12/26/12 0937  BP:  101/64    Pulse:  88    Temp:  98.6 F (37 C)    TempSrc:  Oral    Resp:  18    Height:   5\' 4"  (1.626 m)   Weight:   68.947 kg (152 lb)   SpO2: 92% 93%  93%    Intake/Output Summary (Last 24 hours) at 12/26/12 1004 Last data filed at 12/26/12 0758  Gross per 24 hour  Intake      0 ml  Output      1 ml  Net     -1 ml   Filed Weights   12/25/12 1547 12/26/12 0700  Weight: 67.359 kg (148 lb 8 oz) 68.947 kg (152 lb)    Exam:   General: mild distress due to  pain  Cardiovascular: S 1, S 2 RRR  Respiratory: CTA  Abdomen: Bs present, soft, generalized tenderness.   Musculoskeletal: no edema.   Data Reviewed: Basic Metabolic Panel:  Recent Labs Lab 12/24/12 1240 12/25/12 0545 12/26/12 0505  NA 127* 132* 134*  K 4.3 3.6 3.7  CL 87* 95* 99  CO2 27 25 27   GLUCOSE 394* 257* 207*  BUN 16 20 33*  CREATININE 0.56 1.18* 2.34*  CALCIUM 9.6 8.1* 8.1*   Liver Function Tests:  Recent Labs Lab 12/24/12 1240  AST 27  ALT 14  ALKPHOS 128*  BILITOT 0.2*  PROT 8.3  ALBUMIN 2.9*    Recent Labs Lab 12/24/12 1240  LIPASE 22   No results found for this basename: AMMONIA,  in the last 168 hours CBC:  Recent Labs Lab 12/24/12 1240 12/25/12 0545 12/26/12 0505  WBC 15.7* 11.0* 8.4  NEUTROABS 13.6*  --   --   HGB 13.2 10.6* 9.9*  HCT 37.4 31.7* 30.7*  MCV 86.2 87.3 90.0  PLT 298 206 197   Cardiac Enzymes: No results found for this basename: CKTOTAL, CKMB, CKMBINDEX, TROPONINI,  in the last 168 hours BNP (last 3 results) No results found for this basename: PROBNP,  in the last 8760 hours CBG:  Recent Labs Lab 12/24/12 2112 12/25/12 0725 12/25/12 1156 12/25/12 1631 12/25/12 2220  GLUCAP 220* 259* 235* 136*  134*    No results found for this or any previous visit (from the past 240 hour(s)).   Studies: Ct Abdomen Pelvis W Contrast  12/24/2012   *RADIOLOGY REPORT*  Clinical Data: Abdominal pain.  Nausea and vomiting.  Indwelling jejunal feeding tube.  Surgical history includes cholecystectomy, hysterectomy, and resection.  CT ABDOMEN AND PELVIS WITH CONTRAST  Technique:  Multidetector CT imaging of the abdomen and pelvis was performed following the standard protocol during bolus administration of intravenous contrast.  Contrast: 168mL OMNIPAQUE IOHEXOL 300 MG/ML IV. Oral contrast was also administered.  Comparison: CT abdomen and pelvis 02/26/2012.  Findings: Gastrostomy tube in the proximal body of the stomach without  complicating features.  Coaxial small bowel feeding tube is looped in the fourth portion of the duodenum with its tip back and the distal third portion of the duodenum.  Circumferential wall thickening involving the second portion of the duodenum, with luminal narrowing.  Small bowel anastomosis in the left upper quadrant is widely patent; mild dilation of this segment is secondary to the expected post operative atony.  Remaining small bowel normal in appearance.  Sigmoid colon extends upward into the left upper quadrant of the abdomen.  Entire colon decompressed and unremarkable.  Normal appendix in the right upper pelvis.  No ascites.  Mild diffuse hepatic steatosis without focal hepatic parenchymal abnormality.  Gallbladder surgically absent, accounting for the mild intra and extrahepatic biliary ductal dilation.  Normal appearing spleen, pancreas, adrenal glands, and kidneys.  Moderate aorto-iliofemoral atherosclerosis.  No significant lymphadenopathy.  Uterus surgically absent.  Urinary bladder with diffuse mild wall thickening.  Numerous pelvic phleboliths.  No adnexal masses or free pelvic fluid.  Bone window images demonstrate solid Ray cage fusion at L4-5, degenerative changes involving the facet joints of the lower lumbar spine, and a Schmorl's node in the upper endplate of 624THL. Visualized lung bases clear.  Heart size normal with apparent prior LAD and diagonal coronary stents.  IMPRESSION:  1. Circumferential wall thickening involving the second portion of the duodenum over a long segment, most likely secondary to duodenitis.  Duodenal carcinoma is possible but felt less likely. 2.  Feeding tube looped in the fourth portion of the duodenum with its tip back in the third portion of the duodenum. 3.  No evidence of bowel obstruction. 4.  Mild diffuse thickening of the wall of the urinary bladder. This is nonspecific and can be seen with cystitis or chronic outlet obstruction. 5.  Mild diffuse hepatic  steatosis without focal hepatic parenchymal abnormality. 6.  Moderate aorto-iliofemoral atherosclerosis which is quite advanced for age.   Original Report Authenticated By: Evangeline Dakin, M.D.    Scheduled Meds: . atorvastatin  40 mg Oral q1800  . docusate sodium  100 mg Oral BID  . enoxaparin (LOVENOX) injection  30 mg Subcutaneous Q24H  . gabapentin  600 mg Oral TID  . insulin aspart  0-15 Units Subcutaneous TID WC  . insulin glargine  10 Units Subcutaneous QHS  . mometasone-formoterol  2 puff Inhalation BID  . multivitamin with minerals  1 tablet Oral Daily  . pantoprazole (PROTONIX) IV  40 mg Intravenous Q12H  . sodium chloride  3 mL Intravenous Q12H   Continuous Infusions: . sodium chloride    . feeding supplement (OSMOLITE 1.2 CAL) 1,000 mL (12/26/12 [id number])    Principal Problem:   Nausea and vomiting Active Problems:   Gastroparesis   GERD (gastroesophageal reflux disease)   Hypertension   DM (diabetes mellitus), type 2,  uncontrolled   Hyponatremia   Leukocytosis, unspecified    Time spent: 35 minutes.     Barton Want  [facility] Pager [phone]. If 7PM-7AM, please contact night-coverage at [email or web address], password [facility] 12/26/2012, 10:04 AM  LOS: 2 days

## 2012-12-26 NOTE — Progress Notes (Deleted)
[facility] Progress Note  Subjective: Complaining of mild abdominal bloating and back pain, wants to know what the next plan is.  Objective: Vital signs in last 24 hours: Temp:  [98 F (36.7 C)-98.6 F (37 C)] 98.6 F (37 C) (05/27 2200) Pulse Rate:  [88] 88 (05/27 2200) Resp:  [18] 18 (05/27 2200) BP: (80-126)/(44-82) 101/64 mmHg (05/27 2200) SpO2:  [92 %-100 %] 93 % (05/28 0937) Weight:  [67.359 kg (148 lb 8 oz)-68.947 kg (152 lb)] 68.947 kg (152 lb) (05/28 0700) Weight change:    PE: Alert oriented abdomen soft slightly distended  Lab Results: Results for orders placed during the hospital encounter of 12/24/12 (from the past 24 hour(s))  GLUCOSE, CAPILLARY     Status: Abnormal   Collection Time    12/25/12 11:56 AM      Result Value Range   Glucose-Capillary 235 (*) 70 - 99 mg/dL   Comment 1 Notify RN    GLUCOSE, CAPILLARY     Status: Abnormal   Collection Time    12/25/12  4:31 PM      Result Value Range   Glucose-Capillary 136 (*) 70 - 99 mg/dL   Comment 1 Notify RN    GLUCOSE, CAPILLARY     Status: Abnormal   Collection Time    12/25/12 10:20 PM      Result Value Range   Glucose-Capillary 134 (*) 70 - 99 mg/dL  CBC     Status: Abnormal   Collection Time    12/26/12  5:05 AM      Result Value Range   WBC 8.4  4.0 - 10.5 K/uL   RBC 3.41 (*) 3.87 - 5.11 MIL/uL   Hemoglobin 9.9 (*) 12.0 - 15.0 g/dL   HCT 30.7 (*) 36.0 - 46.0 %   MCV 90.0  78.0 - 100.0 fL   MCH 29.0  26.0 - 34.0 pg   MCHC 32.2  30.0 - 36.0 g/dL   RDW 15.6 (*) 11.5 - 15.5 %   Platelets 197  150 - 400 K/uL  BASIC METABOLIC PANEL     Status: Abnormal   Collection Time    12/26/12  5:05 AM      Result Value Range   Sodium 134 (*) 135 - 145 mEq/L   Potassium 3.7  3.5 - 5.1 mEq/L   Chloride 99  96 - 112 mEq/L   CO2 27  19 - 32 mEq/L   Glucose, Bld 207 (*) 70 - 99 mg/dL   BUN 33 (*) 6 - 23 mg/dL   Creatinine, Ser 2.34 (*) 0.50 - 1.10 mg/dL   Calcium 8.1 (*) 8.4 - 10.5 mg/dL   GFR  calc non Af Amer 24 (*) >90 mL/min   GFR calc Af Amer 28 (*) >90 mL/min    Studies/Results: Ct Abdomen Pelvis W Contrast  12/24/2012   *RADIOLOGY REPORT*  Clinical Data: Abdominal pain.  Nausea and vomiting.  Indwelling jejunal feeding tube.  Surgical history includes cholecystectomy, hysterectomy, and resection.  CT ABDOMEN AND PELVIS WITH CONTRAST  Technique:  Multidetector CT imaging of the abdomen and pelvis was performed following the standard protocol during bolus administration of intravenous contrast.  Contrast: 184mL OMNIPAQUE IOHEXOL 300 MG/ML IV. Oral contrast was also administered.  Comparison: CT abdomen and pelvis 02/26/2012.  Findings: Gastrostomy tube in the proximal body of the stomach without complicating features.  Coaxial small bowel feeding tube is looped in the fourth portion of the duodenum with its tip back and the  distal third portion of the duodenum.  Circumferential wall thickening involving the second portion of the duodenum, with luminal narrowing.  Small bowel anastomosis in the left upper quadrant is widely patent; mild dilation of this segment is secondary to the expected post operative atony.  Remaining small bowel normal in appearance.  Sigmoid colon extends upward into the left upper quadrant of the abdomen.  Entire colon decompressed and unremarkable.  Normal appendix in the right upper pelvis.  No ascites.  Mild diffuse hepatic steatosis without focal hepatic parenchymal abnormality.  Gallbladder surgically absent, accounting for the mild intra and extrahepatic biliary ductal dilation.  Normal appearing spleen, pancreas, adrenal glands, and kidneys.  Moderate aorto-iliofemoral atherosclerosis.  No significant lymphadenopathy.  Uterus surgically absent.  Urinary bladder with diffuse mild wall thickening.  Numerous pelvic phleboliths.  No adnexal masses or free pelvic fluid.  Bone window images demonstrate solid Ray cage fusion at L4-5, degenerative changes involving the  facet joints of the lower lumbar spine, and a Schmorl's node in the upper endplate of 624THL. Visualized lung bases clear.  Heart size normal with apparent prior LAD and diagonal coronary stents.  IMPRESSION:  1. Circumferential wall thickening involving the second portion of the duodenum over a long segment, most likely secondary to duodenitis.  Duodenal carcinoma is possible but felt less likely. 2.  Feeding tube looped in the fourth portion of the duodenum with its tip back in the third portion of the duodenum. 3.  No evidence of bowel obstruction. 4.  Mild diffuse thickening of the wall of the urinary bladder. This is nonspecific and can be seen with cystitis or chronic outlet obstruction. 5.  Mild diffuse hepatic steatosis without focal hepatic parenchymal abnormality. 6.  Moderate aorto-iliofemoral atherosclerosis which is quite advanced for age.   Original Report Authenticated By: Evangeline Dakin, M.D.      Assessment: Widely metastatic colon cancer including a mass it would appear to be a high-grade narrowing 30 cm proximal to the stoma. Surgery feels that there is little to offer from their standpoint and they have signed off.  Plan: Will obtain Gastrografin enema to try to delineate the lead and narrowness of the circumferential mass to estimate whether placement of a endoscopic stent would be technically feasible.    Aleda Madl C 12/26/2012, 11:36 AM

## 2012-12-26 NOTE — Progress Notes (Signed)
I have just notified Dr. Dagmar Hait that pt. Is receiving insufficient pain-relief from her current pain regimine; also that pt. Continues to vomit, even after IV Zofran--orders received.

## 2012-12-27 ENCOUNTER — Encounter (HOSPITAL_COMMUNITY): Payer: Self-pay

## 2012-12-27 ENCOUNTER — Encounter (HOSPITAL_COMMUNITY)
Admission: EM | Disposition: A | Discharge status: Home / Self Care | Payer: Self-pay | Source: Home / Self Care | Attending: Internal Medicine

## 2012-12-27 DIAGNOSIS — N179 Acute kidney failure, unspecified: Secondary | ICD-10-CM

## 2012-12-27 HISTORY — PX: ESOPHAGOGASTRODUODENOSCOPY: SHX5428

## 2012-12-27 LAB — BASIC METABOLIC PANEL
Calcium: 8.7 mg/dL (ref 8.4–10.5)
Creatinine, Ser: 0.56 mg/dL (ref 0.50–1.10)
GFR calc Af Amer: >90 mL/min (ref >90)
GFR calc non Af Amer: >90 mL/min (ref >90)
Sodium: 136 mEq/L (ref 135–145)

## 2012-12-27 LAB — CBC
MCH: 28.9 pg (ref 26.0–34.0)
MCV: 88.3 fL (ref 78.0–100.0)
Platelets: 146 10*3/uL — ABNORMAL LOW (ref 150–400)
RDW: 14.8 % (ref 11.5–15.5)

## 2012-12-27 LAB — GLUCOSE, CAPILLARY
Glucose-Capillary: 145 mg/dL — ABNORMAL HIGH (ref 70–99)
Glucose-Capillary: 203 mg/dL — ABNORMAL HIGH (ref 70–99)

## 2012-12-27 SURGERY — EGD (ESOPHAGOGASTRODUODENOSCOPY)
Anesthesia: Moderate Sedation

## 2012-12-27 MED ORDER — FENTANYL CITRATE 0.05 MG/ML IJ SOLN
INTRAMUSCULAR | Status: AC
  Filled 2012-12-27: qty 2

## 2012-12-27 MED ORDER — FENTANYL CITRATE 0.05 MG/ML IJ SOLN
INTRAMUSCULAR | Status: DC | PRN
  Administered 2012-12-27 (×2): 25 ug via INTRAVENOUS

## 2012-12-27 MED ORDER — METOCLOPRAMIDE HCL 10 MG PO TABS
10.0000 mg | ORAL_TABLET | Freq: Four times a day (QID) | ORAL | Status: DC
  Administered 2012-12-27 – 2012-12-29 (×8): 10 mg via ORAL
  Filled 2012-12-27 (×11): qty 1

## 2012-12-27 MED ORDER — MIDAZOLAM HCL 10 MG/2ML IJ SOLN
INTRAMUSCULAR | Status: DC | PRN
  Administered 2012-12-27: 2 mg via INTRAVENOUS
  Administered 2012-12-27 (×3): 1 mg via INTRAVENOUS

## 2012-12-27 MED ORDER — INSULIN ASPART 100 UNIT/ML ~~LOC~~ SOLN
0.0000 [IU] | SUBCUTANEOUS | Status: DC
  Administered 2012-12-27: 8 [IU] via SUBCUTANEOUS
  Administered 2012-12-27: 5 [IU] via SUBCUTANEOUS
  Administered 2012-12-28: 2 [IU] via SUBCUTANEOUS
  Administered 2012-12-28: 8 [IU] via SUBCUTANEOUS
  Administered 2012-12-28: 2 [IU] via SUBCUTANEOUS
  Administered 2012-12-28: 5 [IU] via SUBCUTANEOUS
  Administered 2012-12-28 (×2): 3 [IU] via SUBCUTANEOUS
  Administered 2012-12-29 (×2): 5 [IU] via SUBCUTANEOUS
  Administered 2012-12-29: 11 [IU] via SUBCUTANEOUS
  Administered 2012-12-29: 3 [IU] via SUBCUTANEOUS

## 2012-12-27 MED ORDER — OSMOLITE 1.2 CAL PO LIQD
1000.0000 mL | ORAL | Status: DC
  Administered 2012-12-27 – 2012-12-28 (×2): 1000 mL
  Filled 2012-12-27 (×6): qty 1000

## 2012-12-27 MED ORDER — BUTAMBEN-TETRACAINE-BENZOCAINE 2-2-14 % EX AERO
INHALATION_SPRAY | CUTANEOUS | Status: DC | PRN
  Administered 2012-12-27: 2 via TOPICAL

## 2012-12-27 MED ORDER — METOPROLOL SUCCINATE ER 50 MG PO TB24
50.0000 mg | ORAL_TABLET | Freq: Every day | ORAL | Status: DC
  Administered 2012-12-27 – 2012-12-29 (×3): 50 mg via ORAL
  Filled 2012-12-27 (×3): qty 1

## 2012-12-27 MED ORDER — PROMETHAZINE HCL 25 MG PO TABS
12.5000 mg | ORAL_TABLET | Freq: Four times a day (QID) | ORAL | Status: DC | PRN
  Administered 2012-12-27: 14:00:00 via ORAL
  Administered 2012-12-28 – 2012-12-29 (×2): 12.5 mg via ORAL
  Filled 2012-12-27 (×3): qty 1

## 2012-12-27 MED ORDER — OXYCODONE HCL 5 MG PO TABS
5.0000 mg | ORAL_TABLET | Freq: Four times a day (QID) | ORAL | Status: DC | PRN
  Administered 2012-12-27 – 2012-12-28 (×2): 5 mg via ORAL
  Filled 2012-12-27 (×2): qty 1

## 2012-12-27 MED ORDER — SODIUM CHLORIDE 0.9 % IV SOLN
INTRAVENOUS | Status: DC
  Administered 2012-12-27: 10:00:00 via INTRAVENOUS

## 2012-12-27 MED ORDER — SODIUM CHLORIDE 0.9 % IJ SOLN
10.0000 mL | INTRAMUSCULAR | Status: DC | PRN
  Administered 2012-12-28 – 2012-12-29 (×2): 10 mL

## 2012-12-27 MED ORDER — MIDAZOLAM HCL 10 MG/2ML IJ SOLN
INTRAMUSCULAR | Status: AC
  Filled 2012-12-27: qty 2

## 2012-12-27 NOTE — Op Note (Signed)
[facility] [place], [id number]   ENDOSCOPY PROCEDURE REPORT  PATIENT: Trust, Ahearn  MR#: [id number] BIRTHDATE: 12/13/1970 , 42  yrs. old GENDER: Female ENDOSCOPIST:Prakash Kimberling Amedeo Plenty, MD REFERRED BY: PROCEDURE DATE:  12/27/2012 PROCEDURE: ASA CLASS: INDICATIONS: MEDICATION:  fentanyl 50 mcg, versed 5 mg TOPICAL ANESTHETIC:  DESCRIPTION OF PROCEDURE:   Esophagus: minimal distal esophagitis otherwise nml Stomach: PEG/PEJ intact, stomach otherwise nml. No retained food. Duodenum nml. No thickening, inflammation or luminal narrowing appreciated as seen on CT. J-tube tip not seen at furthest advancement. Biopsies taken      COMPLICATIONS: None  ENDOSCOPIC IMPRESSION:Nml study with intact j-tube and no duodenal abnormalities corresponding with CT impression  RECOMMENDATIONS: Await biopsy results, resume tube feeds    _______________________________ eSignedTeena Irani, MD 12/27/2012 11:35 AM

## 2012-12-27 NOTE — Progress Notes (Signed)
Peripherally Inserted Central Catheter/Midline Placement  The IV Nurse has discussed with the patient and/or persons authorized to consent for the patient, the purpose of this procedure and the potential benefits and risks involved with this procedure.  The benefits include less needle sticks, lab draws from the catheter and patient may be discharged home with the catheter.  Risks include, but not limited to, infection, bleeding, blood clot (thrombus formation), and puncture of an artery; nerve damage and irregular heat beat.  Alternatives to this procedure were also discussed.  PICC/Midline Placement Documentation  PICC / Midline Single Lumen [id number] PICC Right Basilic (Active)       Lonnette Shrode, Nicolette Bang 12/27/2012, 4:49 PM

## 2012-12-27 NOTE — Progress Notes (Signed)
[facility] PROGRESS NOTE  [name] [id number] DOB: [birth year] DOA: 12/24/2012 PCP: [name], MD  Assessment/Plan:  Acute on chronic Abdominal pain, Nausea, vomiting: History of gastroparesis. CT scan show duodenitis. Continue with protonix BID. Continue with tube feeding after endoscopy. Patient had endoscopy 5-29  Result as follow : Esophagus: minimal distal esophagitis  otherwise nml,  Stomach: PEG/PEJ intact, stomach otherwise nml. No retained food. Duodenum nml. No thickening, inflammation or luminal narrowing appreciated as seen on CT.   Hematemesis: Hb stable. Continue with protonix. Acute renal Failure: multifactorial, contrast nephropathy, ATN in setting hypotension, decrease volume. Lisinopril discontinue 5-27. Renal function improved with IV fluids. No hydronephrosis oc renal US. Cr has decrease to 0.5. Gastroparesis; PRN antiemetics. Resume Reglan.  DM (diabetes mellitus), type 2, uncontrolled: Continue with lantus and SSI.  Hyponatremia  ln setting renal failure.  Resolved with IV fluids.  GERD (gastroesophageal reflux disease)  Continue with PPI.  Hypertension, uncontrolled  BP increasing. Resume metoprolol.  Leukocytosis, unspecified  pt afebrile, WBC trending down   Code Status: Full Family Communication:  Disposition Plan: Remain inpatient.    Consultants:  [facility]  Procedures:  none  Antibiotics:  None  HPI/Subjective: Feeling better today, pain has improved.   Objective: Filed Vitals:   12/27/12 1150 12/27/12 1159 12/27/12 1200 12/27/12 1213  BP: 156/93  146/90 149/79  Pulse:      Temp:  98.4 F (36.9 C)  98.7 F (37.1 C)  TempSrc:  Oral  Oral  Resp: 17  5 20   Height:      Weight:      SpO2: 96%  100% 100%    Intake/Output Summary (Last 24 hours) at 12/27/12 1225 Last data filed at 12/27/12 0500  Gross per 24 hour  Intake 2894.17 ml  Output      0 ml  Net 2894.17 ml   Filed Weights   12/25/12 1547 12/26/12 0700   Weight: 67.359 kg (148 lb 8 oz) 68.947 kg (152 lb)    Exam:   General: mild distress due to pain  Cardiovascular: S 1, S 2 RRR  Respiratory: CTA  Abdomen: Bs present, soft, generalized tenderness.   Musculoskeletal: no edema.   Data Reviewed: Basic Metabolic Panel:  Recent Labs Lab 12/24/12 1240 12/25/12 0545 12/26/12 0505 12/27/12 0835  NA 127* 132* 134* 136  K 4.3 3.6 3.7 3.7  CL 87* 95* 99 102  CO2 27 25 27 27   GLUCOSE 394* 257* 207* 116*  BUN 16 20 33* 14  CREATININE 0.56 1.18* 2.34* 0.56  CALCIUM 9.6 8.1* 8.1* 8.7   Liver Function Tests:  Recent Labs Lab 12/24/12 1240  AST 27  ALT 14  ALKPHOS 128*  BILITOT 0.2*  PROT 8.3  ALBUMIN 2.9*    Recent Labs Lab 12/24/12 1240  LIPASE 22   No results found for this basename: AMMONIA,  in the last 168 hours CBC:  Recent Labs Lab 12/24/12 1240 12/25/12 0545 12/26/12 0505 12/27/12 0835  WBC 15.7* 11.0* 8.4 6.8  NEUTROABS 13.6*  --   --   --   HGB 13.2 10.6* 9.9* 9.6*  HCT 37.4 31.7* 30.7* 29.3*  MCV 86.2 87.3 90.0 88.3  PLT 298 206 197 146*   Cardiac Enzymes: No results found for this basename: CKTOTAL, CKMB, CKMBINDEX, TROPONINI,  in the last 168 hours BNP (last 3 results) No results found for this basename: PROBNP,  in the last 8760 hours CBG:  Recent Labs Lab 12/26/12 0751 12/26/12 1151 12/26/12  1624 12/26/12 2207 12/27/12 0741  GLUCAP 221* 152* 226* 236* 145*    No results found for this or any previous visit (from the past 240 hour(s)).   Studies: US Renal  12/26/2012   *RADIOLOGY REPORT*  Clinical Data: Acute renal failure, hypertension, diabetes  RENAL/URINARY TRACT ULTRASOUND COMPLETE  Comparison:  CT abdomen pelvis of 12/24/2012  Findings:  Right Kidney:  No hydronephrosis is seen.  The right kidney measures 13.5 cm sagittally.  The parenchyma of the right kidney is echogenic suggesting chronic renal medical disease.  Correlation with renal laboratory values is recommended.   Left Kidney:  No hydronephrosis is noted.  The left kidney measures 15.1 cm sagittally.  The parenchyma also is echogenic.  Bladder:  The urinary bladder is unremarkable.  IMPRESSION:  1.  No hydronephrosis. 2.  Echogenic renal parenchyma consistent with chronic renal medical disease.  Correlate with renal laboratory values.   Original Report Authenticated By: Ivar Drape, M.D.    Scheduled Meds: . atorvastatin  40 mg Oral q1800  . docusate sodium  100 mg Oral BID  . enoxaparin (LOVENOX) injection  30 mg Subcutaneous Q24H  . gabapentin  600 mg Oral TID  . insulin aspart  0-15 Units Subcutaneous TID WC  . insulin glargine  10 Units Subcutaneous QHS  . mometasone-formoterol  2 puff Inhalation BID  . multivitamin with minerals  1 tablet Oral Daily  . pantoprazole (PROTONIX) IV  40 mg Intravenous Q12H  . sodium chloride  3 mL Intravenous Q12H   Continuous Infusions: . sodium chloride 125 mL/hr at 12/26/12 1814  . feeding supplement (OSMOLITE 1.2 CAL) 1,000 mL (12/27/12 1218)    Principal Problem:   Nausea and vomiting Active Problems:   Gastroparesis   GERD (gastroesophageal reflux disease)   Hypertension   DM (diabetes mellitus), type 2, uncontrolled   Hyponatremia   Leukocytosis, unspecified   Acute renal failure    Time spent: 25 minutes.     Luisa Louk  [facility] Pager [phone]. If 7PM-7AM, please contact night-coverage at [email or web address], password [facility] 12/27/2012, 12:25 PM  LOS: 3 days

## 2012-12-27 NOTE — Progress Notes (Signed)
[facility] Progress Note  Subjective: No more vomiting, some nausea, pain  Objective: Vital signs in last 24 hours: Temp:  [98.6 F (37 C)-99.4 F (37.4 C)] 98.6 F (37 C) (05/29 0943) Pulse Rate:  [84-196] 102 (05/29 1130) Resp:  [5-91] 28 (05/29 1132) BP: (128-206)/(78-113) 206/113 mmHg (05/29 1130) SpO2:  [94 %-100 %] 98 % (05/29 1132) Weight change:    [id number]  Lab Results: Results for orders placed during the hospital encounter of 12/24/12 (from the past 24 hour(s))  GLUCOSE, CAPILLARY     Status: Abnormal   Collection Time    12/26/12 11:51 AM      Result Value Range   Glucose-Capillary 152 (*) 70 - 99 mg/dL   Comment 1 Notify RN    GLUCOSE, CAPILLARY     Status: Abnormal   Collection Time    12/26/12  4:24 PM      Result Value Range   Glucose-Capillary 226 (*) 70 - 99 mg/dL   Comment 1 Notify RN    GLUCOSE, CAPILLARY     Status: Abnormal   Collection Time    12/26/12 10:07 PM      Result Value Range   Glucose-Capillary 236 (*) 70 - 99 mg/dL  GLUCOSE, CAPILLARY     Status: Abnormal   Collection Time    12/27/12  7:41 AM      Result Value Range   Glucose-Capillary 145 (*) 70 - 99 mg/dL  CBC     Status: Abnormal   Collection Time    12/27/12  8:35 AM      Result Value Range   WBC 6.8  4.0 - 10.5 K/uL   RBC 3.32 (*) 3.87 - 5.11 MIL/uL   Hemoglobin 9.6 (*) 12.0 - 15.0 g/dL   HCT 29.3 (*) 36.0 - 46.0 %   MCV 88.3  78.0 - 100.0 fL   MCH 28.9  26.0 - 34.0 pg   MCHC 32.8  30.0 - 36.0 g/dL   RDW 14.8  11.5 - 15.5 %   Platelets 146 (*) 150 - 400 K/uL  BASIC METABOLIC PANEL     Status: Abnormal   Collection Time    12/27/12  8:35 AM      Result Value Range   Sodium 136  135 - 145 mEq/L   Potassium 3.7  3.5 - 5.1 mEq/L   Chloride 102  96 - 112 mEq/L   CO2 27  19 - 32 mEq/L   Glucose, Bld 116 (*) 70 - 99 mg/dL   BUN 14  6 - 23 mg/dL   Creatinine, Ser 0.56  0.50 - 1.10 mg/dL   Calcium 8.7  8.4 - 10.5 mg/dL   GFR calc non Af Amer >90  >90  mL/min   GFR calc Af Amer >90  >90 mL/min    Studies/Results: US Renal  12/26/2012   *RADIOLOGY REPORT*  Clinical Data: Acute renal failure, hypertension, diabetes  RENAL/URINARY TRACT ULTRASOUND COMPLETE  Comparison:  CT abdomen pelvis of 12/24/2012  Findings:  Right Kidney:  No hydronephrosis is seen.  The right kidney measures 13.5 cm sagittally.  The parenchyma of the right kidney is echogenic suggesting chronic renal medical disease.  Correlation with renal laboratory values is recommended.  Left Kidney:  No hydronephrosis is noted.  The left kidney measures 15.1 cm sagittally.  The parenchyma also is echogenic.  Bladder:  The urinary bladder is unremarkable.  IMPRESSION:  1.  No hydronephrosis. 2.  Echogenic renal parenchyma consistent with chronic  renal medical disease.  Correlate with renal laboratory values.   Original Report Authenticated By: Ivar Drape, M.D.      Assessment: EGD showed no abnormality corresponding to the duodenal wall thickening, luminal narrowing. J-ube in place, no tube feeds or retained gastric contents seen. No active bleeding source. Biopsies taken of normal appearing distal duodenum  Plan: Await path, resume tube feeds, review CT, consider repeat study or CT enterography if intolerance of tube feeds persist    Nadean Montanaro C 12/27/2012, 11:38 AM

## 2012-12-28 DIAGNOSIS — K3184 Gastroparesis: Secondary | ICD-10-CM

## 2012-12-28 LAB — BASIC METABOLIC PANEL
BUN: 10 mg/dL (ref 6–23)
CO2: 33 mEq/L — ABNORMAL HIGH (ref 19–32)
Chloride: 98 mEq/L (ref 96–112)
Creatinine, Ser: 0.53 mg/dL (ref 0.50–1.10)
Glucose, Bld: 143 mg/dL — ABNORMAL HIGH (ref 70–99)
Potassium: 3.9 mEq/L (ref 3.5–5.1)

## 2012-12-28 LAB — CBC
HCT: 28.7 % — ABNORMAL LOW (ref 36.0–46.0)
Hemoglobin: 9.1 g/dL — ABNORMAL LOW (ref 12.0–15.0)
MCH: 28.2 pg (ref 26.0–34.0)
MCHC: 31.7 g/dL (ref 30.0–36.0)
MCV: 88.9 fL (ref 78.0–100.0)
RDW: 14.4 % (ref 11.5–15.5)

## 2012-12-28 LAB — GLUCOSE, CAPILLARY
Glucose-Capillary: 230 mg/dL — ABNORMAL HIGH (ref 70–99)
Glucose-Capillary: 133 mg/dL — ABNORMAL HIGH (ref 70–99)
Glucose-Capillary: 287 mg/dL — ABNORMAL HIGH (ref 70–99)

## 2012-12-28 MED ORDER — OXYCODONE HCL 5 MG PO TABS
5.0000 mg | ORAL_TABLET | Freq: Four times a day (QID) | ORAL | Status: DC | PRN
  Administered 2012-12-28 – 2012-12-29 (×4): 10 mg via ORAL
  Filled 2012-12-28 (×5): qty 2

## 2012-12-28 NOTE — Progress Notes (Signed)
[facility] PROGRESS NOTE  [name] [id number] DOB: July 25, 1971 DOA: 12/24/2012 PCP: [name], MD  Assessment/Plan:  Acute on chronic Abdominal pain, Nausea, vomiting: History of gastroparesis. CT scan show duodenitis. Continue with protonix BID. Continue with tube feeding after endoscopy. Patient had endoscopy 5-29  Result as follow : Esophagus: minimal distal esophagitis  otherwise nml,  Stomach: PEG/PEJ intact, stomach otherwise nml. No retained food. Duodenum nml. No thickening, inflammation or luminal narrowing appreciated as seen on CT.  Still with abdominal pain, will stop IV dilaudid. Will try pain controlled with oral pain medications.   Hematemesis: Hb stable. Continue with protonix. Resolved.  Acute renal Failure: multifactorial, contrast nephropathy, ATN in setting hypotension, decrease volume. Lisinopril discontinue 5-27. Renal function improved with IV fluids. No hydronephrosis oc renal US. Cr has decrease to 0.5. Gastroparesis; PRN antiemetics. Resume Reglan.  DM (diabetes mellitus), type 2, uncontrolled: Continue with lantus and SSI.  Hyponatremia  ln setting renal failure.  Resolved with IV fluids.  GERD (gastroesophageal reflux disease)  Continue with PPI.  Hypertension, uncontrolled  BP increasing. Resume metoprolol.  Leukocytosis, unspecified  pt afebrile, WBC trending down   Code Status: Full Family Communication:  Disposition Plan: Remain inpatient.    Consultants:  [facility]  Procedures:  none  Antibiotics:  None  HPI/Subjective: Still complaining of abdominal pain, willing to try regular food.   Objective: Filed Vitals:   12/27/12 2011 12/27/12 2200 12/28/12 0600 12/28/12 0851  BP:  159/87 152/90   Pulse:  91 89   Temp:  98.6 F (37 C) 98.8 F (37.1 C)   TempSrc:  Oral Oral   Resp:  20 20   Height:      Weight:      SpO2: 99% 100% 98% 97%    Intake/Output Summary (Last 24 hours) at 12/28/12 1154 Last data filed at  12/28/12 0500  Gross per 24 hour  Intake 3351.67 ml  Output      2 ml  Net 3349.67 ml   Filed Weights   12/25/12 1547 12/26/12 0700  Weight: 67.359 kg (148 lb 8 oz) 68.947 kg (152 lb)    Exam:   General: mild distress due to pain  Cardiovascular: S 1, S 2 RRR  Respiratory: CTA  Abdomen: Bs present, soft, generalized tenderness.   Musculoskeletal: no edema.   Data Reviewed: Basic Metabolic Panel:  Recent Labs Lab 12/24/12 1240 12/25/12 0545 12/26/12 0505 12/27/12 0835 12/28/12 0357  NA 127* 132* 134* 136 136  K 4.3 3.6 3.7 3.7 3.9  CL 87* 95* 99 102 98  CO2 27 25 27 27  33*  GLUCOSE 394* 257* 207* 116* 143*  BUN 16 20 33* 14 10  CREATININE 0.56 1.18* 2.34* 0.56 0.53  CALCIUM 9.6 8.1* 8.1* 8.7 8.9   Liver Function Tests:  Recent Labs Lab 12/24/12 1240  AST 27  ALT 14  ALKPHOS 128*  BILITOT 0.2*  PROT 8.3  ALBUMIN 2.9*    Recent Labs Lab 12/24/12 1240  LIPASE 22   No results found for this basename: AMMONIA,  in the last 168 hours CBC:  Recent Labs Lab 12/24/12 1240 12/25/12 0545 12/26/12 0505 12/27/12 0835 12/28/12 0357  WBC 15.7* 11.0* 8.4 6.8 7.9  NEUTROABS 13.6*  --   --   --   --   HGB 13.2 10.6* 9.9* 9.6* 9.1*  HCT 37.4 31.7* 30.7* 29.3* 28.7*  MCV 86.2 87.3 90.0 88.3 88.9  PLT 298 206 197 146* 174   Cardiac Enzymes:  No results found for this basename: CKTOTAL, CKMB, CKMBINDEX, TROPONINI,  in the last 168 hours BNP (last 3 results) No results found for this basename: PROBNP,  in the last 8760 hours CBG:  Recent Labs Lab 12/27/12 1655 12/27/12 2009 12/27/12 2351 12/28/12 0403 12/28/12 0811  GLUCAP 278* 203* 230* 143* 133*    No results found for this or any previous visit (from the past 240 hour(s)).   Studies: US Renal  12/26/2012   *RADIOLOGY REPORT*  Clinical Data: Acute renal failure, hypertension, diabetes  RENAL/URINARY TRACT ULTRASOUND COMPLETE  Comparison:  CT abdomen pelvis of 12/24/2012  Findings:  Right  Kidney:  No hydronephrosis is seen.  The right kidney measures 13.5 cm sagittally.  The parenchyma of the right kidney is echogenic suggesting chronic renal medical disease.  Correlation with renal laboratory values is recommended.  Left Kidney:  No hydronephrosis is noted.  The left kidney measures 15.1 cm sagittally.  The parenchyma also is echogenic.  Bladder:  The urinary bladder is unremarkable.  IMPRESSION:  1.  No hydronephrosis. 2.  Echogenic renal parenchyma consistent with chronic renal medical disease.  Correlate with renal laboratory values.   Original Report Authenticated By: Ivar Drape, M.D.    Scheduled Meds: . atorvastatin  40 mg Oral q1800  . docusate sodium  100 mg Oral BID  . gabapentin  600 mg Oral TID  . insulin aspart  0-15 Units Subcutaneous Q4H  . insulin glargine  10 Units Subcutaneous QHS  . metoCLOPramide  10 mg Oral QID  . metoprolol succinate  50 mg Oral Daily  . mometasone-formoterol  2 puff Inhalation BID  . multivitamin with minerals  1 tablet Oral Daily  . pantoprazole (PROTONIX) IV  40 mg Intravenous Q12H  . sodium chloride  3 mL Intravenous Q12H   Continuous Infusions: . sodium chloride 75 mL/hr at 12/27/12 1650  . feeding supplement (OSMOLITE 1.2 CAL) 1,000 mL (12/27/12 1218)    Principal Problem:   Nausea and vomiting Active Problems:   Gastroparesis   GERD (gastroesophageal reflux disease)   Hypertension   DM (diabetes mellitus), type 2, uncontrolled   Hyponatremia   Leukocytosis, unspecified   Acute renal failure    Time spent: 25 minutes.     REGALADO,BELKYS  [facility] Pager [phone]. If 7PM-7AM, please contact night-coverage at [email or web address], password [facility] 12/28/2012, 11:54 AM  LOS: 4 days

## 2012-12-28 NOTE — Progress Notes (Signed)
Nutrition Brief Note  TF: Osmolite 1.2 at 13ml/hr via J-tube - provides 1728 calories, 80g protein which meets 119% estimated calorie needs, 133% estimated protein needs.   Met with pt who reports tolerating TF well. Pt reports still having some nausea and abdominal pain. Pt denies any vomiting, only dry heaves. Pt reports drinking 100% of orange juice this morning without any difficulty, admits to only a little bit of nausea afterwards. Diet advanced to full liquids at lunch. Pt denies any nutrition needs/concerns.   Mikey College MS, [place], [place] Pager [phone] After Hours Pager

## 2012-12-29 LAB — GLUCOSE, CAPILLARY
Glucose-Capillary: 236 mg/dL — ABNORMAL HIGH (ref 70–99)
Glucose-Capillary: 337 mg/dL — ABNORMAL HIGH (ref 70–99)

## 2012-12-29 LAB — BASIC METABOLIC PANEL
BUN: 8 mg/dL (ref 6–23)
CO2: 29 mEq/L (ref 19–32)
Calcium: 8.9 mg/dL (ref 8.4–10.5)
GFR calc non Af Amer: >90 mL/min (ref >90)
Glucose, Bld: 248 mg/dL — ABNORMAL HIGH (ref 70–99)

## 2012-12-29 MED ORDER — ATORVASTATIN CALCIUM 40 MG PO TABS: 40.0000 mg | ORAL_TABLET | Freq: Every day | ORAL | Status: DC

## 2012-12-29 MED ORDER — TRAMADOL HCL 50 MG PO TABS
50.0000 mg | ORAL_TABLET | Freq: Once | ORAL | Status: AC
  Administered 2012-12-29: 50 mg via ORAL
  Filled 2012-12-29: qty 1

## 2012-12-29 MED ORDER — GABAPENTIN 300 MG PO CAPS: 600.0000 mg | ORAL_CAPSULE | Freq: Three times a day (TID) | ORAL | Status: DC

## 2012-12-29 MED ORDER — INSULIN GLARGINE 100 UNIT/ML ~~LOC~~ SOLN: 10.0000 [IU] | Freq: Every day | SUBCUTANEOUS | Status: DC

## 2012-12-29 MED ORDER — PROMETHAZINE HCL 12.5 MG PO TABS: 12.5000 mg | ORAL_TABLET | Freq: Four times a day (QID) | ORAL | Status: DC | PRN

## 2012-12-29 MED ORDER — METOPROLOL SUCCINATE ER 50 MG PO TB24: 50.0000 mg | ORAL_TABLET | Freq: Every day | ORAL | Status: DC

## 2012-12-29 MED ORDER — OXYCODONE HCL 5 MG PO TABS: 5.0000 mg | ORAL_TABLET | Freq: Four times a day (QID) | ORAL | Status: DC | PRN

## 2012-12-29 MED ORDER — ESOMEPRAZOLE MAGNESIUM 40 MG PO CPDR: 40.0000 mg | DELAYED_RELEASE_CAPSULE | Freq: Two times a day (BID) | ORAL | Status: DC

## 2012-12-29 MED ORDER — PANTOPRAZOLE SODIUM 40 MG PO PACK
40.0000 mg | PACK | Freq: Two times a day (BID) | ORAL | Status: DC
  Administered 2012-12-29: 40 mg
  Filled 2012-12-29 (×2): qty 20

## 2012-12-29 NOTE — Discharge Summary (Signed)
Physician Discharge Summary  [name] [id number] DOB: 09/16/1970 DOA: 12/24/2012  PCP: [name], MD  Admit date: 12/24/2012 Discharge date: 12/29/2012  Time spent: 35 minutes  Recommendations for Outpatient Follow-up:  1. Needs to follow up with Dr Amedeo Plenty, for repeat CT scan.  2. Needs further adjustment of diabetes medications.  3. Consider to resume lisinopril if renal function stable.  4. Need B-met to follow renal function.   Discharge Diagnoses:    Acute on Chronic Abdominal pain.    Duodenitis ?    Acute renal failure, contrast nephropathy, pre renal, ACE use.    Hyponatremia   Leukocytosis, unspecified   Nausea and vomiting   Gastroparesis   GERD (gastroesophageal reflux disease)   Hypertension   DM (diabetes mellitus), type 2, uncontrolled  Discharge Condition: Stable.   Diet recommendation: Carb modified diet  Filed Weights   12/25/12 1547 12/26/12 0700  Weight: 67.359 kg (148 lb 8 oz) 68.947 kg (152 lb)    History of present illness:  Pt is 42 yo female with multiple and complex medical history including recurrent abdominal pain which was determined to be multifactorial in etiology and secondary to diabetic gastroparesis, narcotic use, chronic pancreatitis. She was admitted last 02/24/2102 and has had jejunostomy tube placed for feeding (03/09/2012), has been hospitalized at [facility] since that time and we have no records available. She is now presenting with 2-3 days worsening abdominal pain, constant, non radiating, 10/10 in severity, associated with nausea and non bloody vomiting, unable to tolerate medications. She denies fevers, chills, no specific urinary concerns. Pt denies any specific alleviating or aggravating factors.    Hospital Course:   1-Acute on chronic Abdominal pain, unclear etiology, could be secondary to gastroparesis or duodenitis.  CT scan show duodenitis. Continue with protonix BID. Continue with tube feeding. Patient had endoscopy 5-29  Result as follow : Esophagus: minimal distal esophagitis otherwise nml, Stomach: PEG/PEJ intact, stomach otherwise nml. No retained food. Duodenum nml. No thickening, inflammation or luminal narrowing appreciated as seen on CT. Patient nausea and vomiting resolved. She has been tolerating tube feeding. She was receiving very regular dilaudid and phenergan. IV pain medications was discontinue. She was transition to oral pain medication. I gave prescription for oxycodone. Advised patient that opioid could worse gastroparesis. Pain medication for only short term. Patient feels she can go home.   2-Hematemesis: Hb stable. Continue with protonix. Resolved. Endoscopy negative for source of bleeding.  Acute renal Failure: multifactorial, contrast nephropathy, ATN in setting hypotension, decrease volume. Lisinopril discontinue 5-27. Renal function improved with IV fluids. No hydronephrosis oc renal US.  Cr peak during this admission to 2.3. Cr has decrease to 0.5. Gastroparesis; PRN antiemetics. Resume Reglan.  DM (diabetes mellitus), type 2, uncontrolled: Continue with lantus and SSI.  Hyponatremia  ln setting renal failure.  Resolved with IV fluids.  GERD (gastroesophageal reflux disease)  Continue with PPI.  Hypertension, uncontrolled  BP increasing. Resume metoprolol.  Leukocytosis, unspecified  pt afebrile, resolved.    Procedures: Endoscopy 5-29 : Esophagus: minimal distal esophagitis otherwise nml, Stomach: PEG/PEJ intact, stomach otherwise nml. No retained food. Duodenum nml. No thickening, inflammation or luminal narrowing appreciated as seen on CT.   Consultations:  Dr Amedeo Plenty, GI  Discharge Exam: Filed Vitals:   12/28/12 1955 12/28/12 2200 12/29/12 0620 12/29/12 0859  BP:  135/79 153/85   Pulse:  89 84   Temp:  99.7 F (37.6 C) 98.9 F (37.2 C)   TempSrc:  Oral Oral   Resp:  20 20   Height:      Weight:      SpO2: 99% 98% 96% 97%    General: No distress.  Cardiovascular: S  1, S 2 RRR Respiratory: CTA Abdomen: Soft, mild tender, no rigidity.   Discharge Instructions  Discharge Orders   Future Orders Complete By Expires     Diet Carb Modified  As directed     Increase activity slowly  As directed         Medication List    STOP taking these medications       lisinopril 10 MG tablet  Commonly known as:  PRINIVIL      TAKE these medications       atorvastatin 40 MG tablet  Commonly known as:  LIPITOR  Take 1 tablet (40 mg total) by mouth daily.     atorvastatin 40 MG tablet  Commonly known as:  LIPITOR  Take 1 tablet (40 mg total) by mouth daily at 6 PM.     docusate sodium 100 MG capsule  Commonly known as:  COLACE  Take 100 mg by mouth 2 (two) times daily.     esomeprazole 40 MG capsule  Commonly known as:  NEXIUM  Take 1 capsule (40 mg total) by mouth 2 (two) times daily.     feeding supplement (OSMOLITE 1.2 CAL) Liqd  Place 1,000 mLs into feeding tube continuous. 53ml/hr continuously for 90 days     Fluticasone-Salmeterol 250-50 MCG/DOSE Aepb  Commonly known as:  ADVAIR  Inhale 1 puff into the lungs 2 (two) times daily.     gabapentin 600 MG tablet  Commonly known as:  NEURONTIN  Take 1 tablet (600 mg total) by mouth 3 (three) times daily.     gabapentin 300 MG capsule  Commonly known as:  NEURONTIN  Take 2 capsules (600 mg total) by mouth 3 (three) times daily.     insulin glargine 100 UNIT/ML injection  Commonly known as:  LANTUS  Inject 0.1 mLs (10 Units total) into the skin at bedtime.     insulin lispro 100 UNIT/ML injection  Commonly known as:  HUMALOG  Inject 10-20 Units into the skin 3 (three) times daily before meals. Sliding scale     metoCLOPramide 10 MG tablet  Commonly known as:  REGLAN  Take 10 mg by mouth 4 (four) times daily.     metoprolol succinate 50 MG 24 hr tablet  Commonly known as:  TOPROL XL  Take 1 tablet (50 mg total) by mouth daily. Take with or immediately following a meal.      multivitamin with minerals Tabs  Take 1 tablet by mouth daily.     oxyCODONE 5 MG immediate release tablet  Commonly known as:  Oxy IR/ROXICODONE  Take 1-2 tablets (5-10 mg total) by mouth every 6 (six) hours as needed.     promethazine 12.5 MG tablet  Commonly known as:  PHENERGAN  Take 1 tablet (12.5 mg total) by mouth every 6 (six) hours as needed.     UNABLE TO FIND  Free Water: 120 ml 5 times a day  Pump for tube feeding  IV Pole  Clamp  Bags     zolpidem 10 MG tablet  Commonly known as:  AMBIEN  Take 10 mg by mouth at bedtime as needed for sleep.       Allergies  Allergen Reactions  . Penicillins Hives  . Cefadroxil     Not sure of effects-listed as allergy on  list from home  . Compazine Hives  . Darvocet (Propoxyphene-Acetaminophen) Other (See Comments)    UNKNOWN  . Nsaids Hives  . Prochlorperazine     Not sure of effects-listed as allergy on list from home  . Zofran Hives  . Zofran (Ondansetron) Hives       Follow-up Information   Follow up with [facility], MD In 1 week.   Contact information:   [address] [place] [id number]       Follow up with HAYES,JOHN C, MD In 1 week.   Contact information:   [address] [place] [address] [phone]        The results of significant diagnostics from this hospitalization (including imaging, microbiology, ancillary and laboratory) are listed below for reference.    Significant Diagnostic Studies: Ct Abdomen Pelvis W Contrast  12/24/2012   *RADIOLOGY REPORT*  Clinical Data: Abdominal pain.  Nausea and vomiting.  Indwelling jejunal feeding tube.  Surgical history includes cholecystectomy, hysterectomy, and resection.  CT ABDOMEN AND PELVIS WITH CONTRAST  Technique:  Multidetector CT imaging of the abdomen and pelvis was performed following the standard protocol during bolus administration of intravenous contrast.  Contrast: 150mL OMNIPAQUE  IOHEXOL 300 MG/ML IV. Oral contrast was also administered.  Comparison: CT abdomen and pelvis 02/26/2012.  Findings: Gastrostomy tube in the proximal body of the stomach without complicating features.  Coaxial small bowel feeding tube is looped in the fourth portion of the duodenum with its tip back and the distal third portion of the duodenum.  Circumferential wall thickening involving the second portion of the duodenum, with luminal narrowing.  Small bowel anastomosis in the left upper quadrant is widely patent; mild dilation of this segment is secondary to the expected post operative atony.  Remaining small bowel normal in appearance.  Sigmoid colon extends upward into the left upper quadrant of the abdomen.  Entire colon decompressed and unremarkable.  Normal appendix in the right upper pelvis.  No ascites.  Mild diffuse hepatic steatosis without focal hepatic parenchymal abnormality.  Gallbladder surgically absent, accounting for the mild intra and extrahepatic biliary ductal dilation.  Normal appearing spleen, pancreas, adrenal glands, and kidneys.  Moderate aorto-iliofemoral atherosclerosis.  No significant lymphadenopathy.  Uterus surgically absent.  Urinary bladder with diffuse mild wall thickening.  Numerous pelvic phleboliths.  No adnexal masses or free pelvic fluid.  Bone window images demonstrate solid Ray cage fusion at L4-5, degenerative changes involving the facet joints of the lower lumbar spine, and a Schmorl's node in the upper endplate of 624THL. Visualized lung bases clear.  Heart size normal with apparent prior LAD and diagonal coronary stents.  IMPRESSION:  1. Circumferential wall thickening involving the second portion of the duodenum over a long segment, most likely secondary to duodenitis.  Duodenal carcinoma is possible but felt less likely. 2.  Feeding tube looped in the fourth portion of the duodenum with its tip back in the third portion of the duodenum. 3.  No evidence of bowel  obstruction. 4.  Mild diffuse thickening of the wall of the urinary bladder. This is nonspecific and can be seen with cystitis or chronic outlet obstruction. 5.  Mild diffuse hepatic steatosis without focal hepatic parenchymal abnormality. 6.  Moderate aorto-iliofemoral atherosclerosis which is quite advanced for age.   Original Report Authenticated By: Evangeline Dakin, M.D.  US Renal  12/26/2012   *RADIOLOGY REPORT*  Clinical Data: Acute renal failure, hypertension, diabetes  RENAL/URINARY TRACT ULTRASOUND COMPLETE  Comparison:  CT abdomen pelvis of 12/24/2012  Findings:  Right Kidney:  No hydronephrosis is seen.  The right kidney measures 13.5 cm sagittally.  The parenchyma of the right kidney is echogenic suggesting chronic renal medical disease.  Correlation with renal laboratory values is recommended.  Left Kidney:  No hydronephrosis is noted.  The left kidney measures 15.1 cm sagittally.  The parenchyma also is echogenic.  Bladder:  The urinary bladder is unremarkable.  IMPRESSION:  1.  No hydronephrosis. 2.  Echogenic renal parenchyma consistent with chronic renal medical disease.  Correlate with renal laboratory values.   Original Report Authenticated By: Ivar Drape, M.D.    Microbiology: No results found for this or any previous visit (from the past 240 hour(s)).   Labs: Basic Metabolic Panel:  Recent Labs Lab 12/25/12 0545 12/26/12 0505 12/27/12 0835 12/28/12 0357 12/29/12 0500  NA 132* 134* 136 136 132*  K 3.6 3.7 3.7 3.9 3.7  CL 95* 99 102 98 95*  CO2 25 27 27  33* 29  GLUCOSE 257* 207* 116* 143* 248*  BUN 20 33* 14 10 8   CREATININE 1.18* 2.34* 0.56 0.53 0.51  CALCIUM 8.1* 8.1* 8.7 8.9 8.9   Liver Function Tests:  Recent Labs Lab 12/24/12 1240  AST 27  ALT 14  ALKPHOS 128*  BILITOT 0.2*  PROT 8.3  ALBUMIN 2.9*    Recent Labs Lab 12/24/12 1240  LIPASE 22   No results found for this basename: AMMONIA,  in the last 168 hours CBC:  Recent Labs Lab  12/24/12 1240 12/25/12 0545 12/26/12 0505 12/27/12 0835 12/28/12 0357  WBC 15.7* 11.0* 8.4 6.8 7.9  NEUTROABS 13.6*  --   --   --   --   HGB 13.2 10.6* 9.9* 9.6* 9.1*  HCT 37.4 31.7* 30.7* 29.3* 28.7*  MCV 86.2 87.3 90.0 88.3 88.9  PLT 298 206 197 146* 174   Cardiac Enzymes: No results found for this basename: CKTOTAL, CKMB, CKMBINDEX, TROPONINI,  in the last 168 hours BNP: BNP (last 3 results) No results found for this basename: PROBNP,  in the last 8760 hours CBG:  Recent Labs Lab 12/28/12 1630 12/28/12 2037 12/29/12 12/29/12 0401 12/29/12 0741  GLUCAP 189* 287* 251* 236* 189*       Signed:  Joann Jorge  [facility] 12/29/2012, 9:34 AM

## 2012-12-29 NOTE — Progress Notes (Signed)
This patient is receiving IV Protonix. Based on criteria approved by the Pharmacy and Therapeutics Committee, this medication is being converted to the equivalent oral dose form. These criteria include:   . The patient is eating (either orally or per tube) and/or has been taking other orally administered medications for at least 24 hours.  . This patient has no evidence of active gastrointestinal bleeding or impaired GI absorption (gastrectomy, short bowel, patient on TNA or NPO).   If you have questions about this conversion, please contact the pharmacy department.  Kara Mead, [employer] 12/29/2012 7:55 AM

## 2012-12-29 NOTE — Progress Notes (Signed)
DC instructions gone over with patient.  Rx given.  Patient verbalized understanding of instructions, no questions asked.

## 2012-12-29 NOTE — Progress Notes (Signed)
[facility] Progress Note  Subjective: Somewhat better in terms of pain and nausea. No vomiting no reported blood  Objective: Vital signs in last 24 hours: Temp:  [98.9 F (37.2 C)-99.7 F (37.6 C)] 98.9 F (37.2 C) (05/31 0620) Pulse Rate:  [84-89] 84 (05/31 0620) Resp:  [20] 20 (05/31 0620) BP: (135-163)/(79-85) 153/85 mmHg (05/31 0620) SpO2:  [96 %-100 %] 97 % (05/31 0859) Weight change:    PE: Alert and oriented  Lab Results: Results for orders placed during the hospital encounter of 12/24/12 (from the past 24 hour(s))  GLUCOSE, CAPILLARY     Status: Abnormal   Collection Time    12/28/12 11:39 AM      Result Value Range   Glucose-Capillary 192 (*) 70 - 99 mg/dL   Comment 1 Notify RN    GLUCOSE, CAPILLARY     Status: Abnormal   Collection Time    12/28/12  4:30 PM      Result Value Range   Glucose-Capillary 189 (*) 70 - 99 mg/dL   Comment 1 Notify RN    GLUCOSE, CAPILLARY     Status: Abnormal   Collection Time    12/28/12  8:37 PM      Result Value Range   Glucose-Capillary 287 (*) 70 - 99 mg/dL  GLUCOSE, CAPILLARY     Status: Abnormal   Collection Time    12/29/12 12:00 AM      Result Value Range   Glucose-Capillary 251 (*) 70 - 99 mg/dL   Comment 1 Notify RN    GLUCOSE, CAPILLARY     Status: Abnormal   Collection Time    12/29/12  4:01 AM      Result Value Range   Glucose-Capillary 236 (*) 70 - 99 mg/dL   Comment 1 Notify RN    BASIC METABOLIC PANEL     Status: Abnormal   Collection Time    12/29/12  5:00 AM      Result Value Range   Sodium 132 (*) 135 - 145 mEq/L   Potassium 3.7  3.5 - 5.1 mEq/L   Chloride 95 (*) 96 - 112 mEq/L   CO2 29  19 - 32 mEq/L   Glucose, Bld 248 (*) 70 - 99 mg/dL   BUN 8  6 - 23 mg/dL   Creatinine, Ser 0.51  0.50 - 1.10 mg/dL   Calcium 8.9  8.4 - 10.5 mg/dL   GFR calc non Af Amer >90  >90 mL/min   GFR calc Af Amer >90  >90 mL/min  GLUCOSE, CAPILLARY     Status: Abnormal   Collection Time    12/29/12  7:41 AM       Result Value Range   Glucose-Capillary 189 (*) 70 - 99 mg/dL   Comment 1 Notify RN      Studies/Results: No results found.    Assessment: Chronic gastroparesis requiring jejunal feedings. CT suggestion of thickening the second duodenum, not appreciated by EGD or biopsies been a self-limiting infectious enteritis  Plan: Continue previous medicines and tube feeds. Expectant management alone. Consider followup CT scan at some point.    Gwenda Heiner C 12/29/2012, 10:45 AM

## 2012-12-29 NOTE — Progress Notes (Signed)
Walked with patient to front of hospital to be taken home by family member.

## 2012-12-29 NOTE — Progress Notes (Signed)
After removing PICC pt was instructed to remain on bed rest for 30 minutes per policy.   Pt stated,  "I have to go now."  "My ride is waiting for me and she has to take someone in the family to work"   Pt. Left ambulatory.   Instructed that if bleeding occurred to hold pressure on site.  Pt. Verbalized understanding.

## 2012-12-31 ENCOUNTER — Encounter (HOSPITAL_COMMUNITY): Payer: Self-pay | Admitting: Gastroenterology

## 2013-01-18 ENCOUNTER — Emergency Department (HOSPITAL_COMMUNITY): Payer: Medicare Other

## 2013-01-18 ENCOUNTER — Encounter (HOSPITAL_COMMUNITY): Payer: Self-pay | Admitting: *Deleted

## 2013-01-18 ENCOUNTER — Emergency Department (HOSPITAL_COMMUNITY)
Admission: EM | Admit: 2013-01-18 | Discharge: 2013-01-18 | Disposition: A | Discharge status: Home / Self Care | Payer: Medicare Other | Source: Home / Self Care | Attending: Emergency Medicine | Admitting: Emergency Medicine

## 2013-01-18 DIAGNOSIS — R112 Nausea with vomiting, unspecified: Secondary | ICD-10-CM

## 2013-01-18 DIAGNOSIS — R109 Unspecified abdominal pain: Secondary | ICD-10-CM

## 2013-01-18 LAB — CBC WITH DIFFERENTIAL/PLATELET
Hemoglobin: 12.1 g/dL (ref 12.0–15.0)
Lymphs Abs: 2.8 10*3/uL (ref 0.7–4.0)
Monocytes Relative: 4 % (ref 3–12)
Neutro Abs: 10.1 10*3/uL — ABNORMAL HIGH (ref 1.7–7.7)
Neutrophils Relative %: 74 % (ref 43–77)
Platelets: 300 10*3/uL (ref 150–400)
RBC: 4 MIL/uL (ref 3.87–5.11)
WBC: 13.6 10*3/uL — ABNORMAL HIGH (ref 4.0–10.5)

## 2013-01-18 LAB — COMPREHENSIVE METABOLIC PANEL
ALT: 7 U/L (ref 0–35)
Albumin: 3 g/dL — ABNORMAL LOW (ref 3.5–5.2)
Alkaline Phosphatase: 123 U/L — ABNORMAL HIGH (ref 39–117)
BUN: 12 mg/dL (ref 6–23)
Chloride: 96 mEq/L (ref 96–112)
GFR calc Af Amer: >90 mL/min (ref >90)
Glucose, Bld: 446 mg/dL — ABNORMAL HIGH (ref 70–99)
Potassium: 3.7 mEq/L (ref 3.5–5.1)
Sodium: 134 mEq/L — ABNORMAL LOW (ref 135–145)
Total Bilirubin: 0.2 mg/dL — ABNORMAL LOW (ref 0.3–1.2)

## 2013-01-18 LAB — LIPASE, BLOOD: Lipase: 28 U/L (ref 11–59)

## 2013-01-18 LAB — GLUCOSE, CAPILLARY

## 2013-01-18 MED ORDER — HYDROMORPHONE HCL PF 2 MG/ML IJ SOLN
2.0000 mg | Freq: Once | INTRAMUSCULAR | Status: AC
  Administered 2013-01-18: 2 mg via INTRAMUSCULAR
  Filled 2013-01-18: qty 1

## 2013-01-18 MED ORDER — PROMETHAZINE HCL 25 MG/ML IJ SOLN
12.5000 mg | Freq: Once | INTRAMUSCULAR | Status: AC
  Administered 2013-01-18: 12.5 mg via INTRAMUSCULAR
  Filled 2013-01-18: qty 1

## 2013-01-18 MED ORDER — PROMETHAZINE HCL 25 MG/ML IJ SOLN
12.5000 mg | INTRAMUSCULAR | Status: AC
  Administered 2013-01-18: 12.5 mg via INTRAMUSCULAR
  Filled 2013-01-18: qty 1

## 2013-01-18 MED ORDER — HYDROMORPHONE HCL PF 1 MG/ML IJ SOLN
1.0000 mg | Freq: Once | INTRAMUSCULAR | Status: AC
  Administered 2013-01-18: 1 mg via INTRAVENOUS
  Filled 2013-01-18: qty 1

## 2013-01-18 MED ORDER — HYDROMORPHONE HCL PF 1 MG/ML IJ SOLN
1.0000 mg | Freq: Once | INTRAMUSCULAR | Status: AC
  Administered 2013-01-18: 1 mg via INTRAMUSCULAR
  Filled 2013-01-18: qty 1

## 2013-01-18 MED ORDER — LORAZEPAM 2 MG/ML IJ SOLN
1.0000 mg | Freq: Once | INTRAMUSCULAR | Status: AC
  Administered 2013-01-18: 1 mg via INTRAVENOUS
  Filled 2013-01-18: qty 1

## 2013-01-18 MED ORDER — IOHEXOL 300 MG/ML  SOLN
50.0000 mL | Freq: Once | INTRAMUSCULAR | Status: AC | PRN
  Administered 2013-01-18: 1 mL

## 2013-01-18 MED ORDER — INSULIN ASPART 100 UNIT/ML ~~LOC~~ SOLN
10.0000 [IU] | Freq: Once | SUBCUTANEOUS | Status: AC
  Administered 2013-01-18: 10 [IU] via SUBCUTANEOUS
  Filled 2013-01-18: qty 1

## 2013-01-18 MED ORDER — HYDROMORPHONE HCL PF 2 MG/ML IJ SOLN
2.0000 mg | Freq: Once | INTRAMUSCULAR | Status: AC
  Administered 2013-01-18: 2 mg via INTRAMUSCULAR
  Filled 2013-01-18 (×2): qty 1

## 2013-01-18 MED ORDER — PROMETHAZINE HCL 25 MG/ML IJ SOLN
12.5000 mg | Freq: Once | INTRAMUSCULAR | Status: AC
  Administered 2013-01-18: 12.5 mg via INTRAMUSCULAR

## 2013-01-18 MED ORDER — PROMETHAZINE HCL 25 MG PO TABS: 25.0000 mg | ORAL_TABLET | Freq: Four times a day (QID) | ORAL | Status: DC | PRN

## 2013-01-18 MED ORDER — INSULIN ASPART 100 UNIT/ML ~~LOC~~ SOLN
10.0000 [IU] | Freq: Once | SUBCUTANEOUS | Status: AC
  Administered 2013-01-18: 10 [IU] via INTRAVENOUS

## 2013-01-18 MED ORDER — PANTOPRAZOLE SODIUM 40 MG PO TBEC: 40.0000 mg | DELAYED_RELEASE_TABLET | Freq: Once | ORAL | Status: DC

## 2013-01-18 MED ORDER — SODIUM CHLORIDE 0.9 % IV SOLN
Freq: Once | INTRAVENOUS | Status: AC
  Administered 2013-01-18: 14:00:00 via INTRAVENOUS

## 2013-01-18 MED ORDER — GABAPENTIN 600 MG PO TABS
600.0000 mg | ORAL_TABLET | Freq: Once | ORAL | Status: AC
  Administered 2013-01-18: 600 mg via ORAL
  Filled 2013-01-18: qty 1

## 2013-01-18 MED ORDER — PANTOPRAZOLE SODIUM 40 MG PO PACK
40.0000 mg | PACK | Freq: Once | ORAL | Status: AC
  Administered 2013-01-18: 40 mg
  Filled 2013-01-18: qty 20

## 2013-01-18 MED ORDER — HYDROMORPHONE HCL PF 2 MG/ML IJ SOLN
2.0000 mg | Freq: Once | INTRAMUSCULAR | Status: AC
  Administered 2013-01-18: 2 mg via INTRAMUSCULAR

## 2013-01-18 NOTE — ED Provider Notes (Signed)
History     CSN: [id number]  Arrival date & time 01/18/13  0110   First MD Initiated Contact with Patient 01/18/13 [phone]      Chief Complaint  Patient presents with  . Abdominal Pain  . Chest Pain  . Emesis    Patient is a 42 y.o. female presenting with abdominal pain. The history is provided by the patient. The history is limited by the condition of the patient.  Abdominal Pain This is a recurrent problem. Episode onset: today. The problem occurs constantly. The problem has been gradually worsening. Associated symptoms include abdominal pain. Nothing aggravates the symptoms. Nothing relieves the symptoms. She has tried rest for the symptoms. The treatment provided no relief.    Past Medical History  Diagnosis Date  . Hypertension   . Diabetes mellitus   . Gastroparesis   . Asthma   . GERD (gastroesophageal reflux disease)   . Coronary artery disease   . h/o seizure   . Neuropathy   . Chronic pancreatitis   . Dyslipidemia   . MI (myocardial infarction)   . COPD (chronic obstructive pulmonary disease)   . Seizures     Past Surgical History  Procedure Laterality Date  . Abdominal hysterectomy    . Cholecystectomy    . Peg tube x 4      feeding jejunostomies with PEG tubes  . Cesarean section    . Colon resection due to diverticulitis    . Back surgery    . Esophagogastroduodenoscopy N/A 12/27/2012    Procedure: ESOPHAGOGASTRODUODENOSCOPY (EGD);  Surgeon: Missy Sabins, MD;  Location: [facility];  Service: Endoscopy;  Laterality: N/A;    Family History  Problem Relation Age of Onset  . Cancer Mother     History  Substance Use Topics  . Smoking status: Current Every Day Smoker -- 0.25 packs/day for 2 years    Types: Cigarettes  . Smokeless tobacco: Never Used  . Alcohol Use: No     Comment: hx 1 beer daily -pt states she quit Dec. 2012    OB History   Grav Para Term Preterm Abortions TAB SAB Ect Mult Living                  Review of Systems  Unable  to perform ROS: Acuity of condition  Gastrointestinal: Positive for abdominal pain.    Allergies  Penicillins; Cefadroxil; Compazine; Darvocet; Nsaids; Prochlorperazine; Zofran; and Zofran  Home Medications   Current Outpatient Rx  Name  Route  Sig  Dispense  Refill  . EXPIRED: atorvastatin (LIPITOR) 40 MG tablet   Oral   Take 1 tablet (40 mg total) by mouth daily.   30 tablet   0   . atorvastatin (LIPITOR) 40 MG tablet   Oral   Take 1 tablet (40 mg total) by mouth daily at 6 PM.   30 tablet   0   . docusate sodium (COLACE) 100 MG capsule   Oral   Take 100 mg by mouth 2 (two) times daily.         Marland Kitchen esomeprazole (NEXIUM) 40 MG capsule   Oral   Take 1 capsule (40 mg total) by mouth 2 (two) times daily.   30 capsule   0   . Fluticasone-Salmeterol (ADVAIR) 250-50 MCG/DOSE AEPB   Inhalation   Inhale 1 puff into the lungs 2 (two) times daily.   60 each   0   . gabapentin (NEURONTIN) 300 MG capsule   Oral  Take 2 capsules (600 mg total) by mouth 3 (three) times daily.   30 capsule   0   . EXPIRED: gabapentin (NEURONTIN) 600 MG tablet   Oral   Take 1 tablet (600 mg total) by mouth 3 (three) times daily.   90 tablet   0   . insulin glargine (LANTUS) 100 UNIT/ML injection   Subcutaneous   Inject 0.1 mLs (10 Units total) into the skin at bedtime.   10 mL   12   . insulin lispro (HUMALOG) 100 UNIT/ML injection   Subcutaneous   Inject 10-20 Units into the skin 3 (three) times daily before meals. Sliding scale   10 mL   2   . metoCLOPramide (REGLAN) 10 MG tablet   Oral   Take 10 mg by mouth 4 (four) times daily.         . metoprolol succinate (TOPROL XL) 50 MG 24 hr tablet   Oral   Take 1 tablet (50 mg total) by mouth daily. Take with or immediately following a meal.   30 tablet   0   . Multiple Vitamin (MULITIVITAMIN WITH MINERALS) TABS   Oral   Take 1 tablet by mouth daily.   30 tablet   0   . Nutritional Supplements (FEEDING SUPPLEMENT,  OSMOLITE 1.2 CAL,) LIQD   Per Tube   Place 1,000 mLs into feeding tube continuous. 41ml/hr continuously for 90 days   1000 mL   10   . oxyCODONE (OXY IR/ROXICODONE) 5 MG immediate release tablet   Oral   Take 1-2 tablets (5-10 mg total) by mouth every 6 (six) hours as needed.   30 tablet   0   . promethazine (PHENERGAN) 12.5 MG tablet   Oral   Take 1 tablet (12.5 mg total) by mouth every 6 (six) hours as needed.   30 tablet   0   . UNABLE TO FIND      Free Water: 120 ml 5 times a day Pump for tube feeding IV Pole Clamp Bags   1 each   0   . zolpidem (AMBIEN) 10 MG tablet   Oral   Take 10 mg by mouth at bedtime as needed for sleep.          BP 199/114  Pulse 110  Resp 22  SpO2 99%  Physical Exam CONSTITUTIONAL: anxious, thrashing in bed HEAD: Normocephalic/atraumatic EYES: EOMI/PERRL ENMT: Mucous membranes moist.  There is a plastic tube noted in posterior oropharynx No stridor noted NECK: supple no meningeal signs CV: tachycardic S1/S2 noted, no murmurs/rubs/gallops noted LUNGS: Lungs are clear to auscultation bilaterally, no apparent distress ABDOMEN: soft, nontender, no rebound or guarding J-tube site noted.  No overlying erythema/tenderness GU:no cva tenderness NEURO: Pt is awake/alert, moves all extremitiesx4 EXTREMITIES: pulses normal, full ROM SKIN: warm, color normal PSYCH: anxious  ED Course  Procedures   Labs Reviewed  CBC WITH DIFFERENTIAL - Abnormal; Notable for the following:    WBC 13.6 (*)    HCT 34.6 (*)    Neutro Abs 10.1 (*)    All other components within normal limits  COMPREHENSIVE METABOLIC PANEL - Abnormal; Notable for the following:    Sodium 134 (*)    Glucose, Bld 446 (*)    Creatinine, Ser 0.49 (*)    Albumin 3.0 (*)    Alkaline Phosphatase 123 (*)    Total Bilirubin 0.2 (*)    All other components within normal limits  LIPASE, BLOOD  URINALYSIS, ROUTINE W REFLEX  MICROSCOPIC  POCT I-STAT TROPONIN I   Dg Abd  Portable 1v  01/18/2013   *RADIOLOGY REPORT*  Clinical Data: Abdominal pain.  Emesis.  PORTABLE ABDOMEN - 1 VIEW  Comparison: Abdominal radiograph 02/25/2012.  Findings: A percutaneous gastrostomy tube is seen projecting over the left upper quadrant of the abdomen.  Some gas and stool are noted in the splenic flexure of the colon.  No pathologic distension of small bowel.  No gross pneumoperitoneum on this single-view examination.  Surgical clips project over the right upper quadrant of the abdomen, compatible with prior cholecystectomy.  Interbody spacers are noted in the lower lumbar spine.  IMPRESSION: 1.  Nonspecific, nonobstructive bowel gas pattern, as above.   Original Report Authenticated By: Vinnie Langton, M.D.    2:50 AM I spoke to dr ganem with GI soon after seeing patient.  He requests xray of abdomen/chest and call him back if necessary Pt reports she had Jtube placed recently at [place] denies swallowing any plastic recently 4:41 AM Imaging negative However pt still has nausea/vomiting/gagging and I am able to see what appears to be part of j-tube protruding from posterior oropharynx She is stable, no hypoxia D/w dr Penelope Coop.  He recommends having IR evaluate this in the morning (pt had J-tube placed at [facility] by IR per records Pt currently resting comfortably, no distress at this time   MDM  Nursing notes including past medical history and social history reviewed and considered in documentation xrays reviewed and considered Labs/vital reviewed and considered        Sharyon Cable, MD 01/18/13 [phone]

## 2013-01-18 NOTE — H&P (Signed)
Agree 

## 2013-01-18 NOTE — Procedures (Signed)
Procedure:  Malpositioned GJ tube removed New 18 Fr balloon retention single lumen GJ tube advanced over wire into proximal jejunum.  OK to use tube immediately.

## 2013-01-18 NOTE — ED Notes (Signed)
Patient drowsy. She awakens easily and upon opening her eyes asks for pain and nausea meds. Dr Roxanne Mins is aware pt has returned from procedure and he will be over to see her.

## 2013-01-18 NOTE — ED Notes (Signed)
MD Wickline aware that IV team was unable to gain IV access

## 2013-01-18 NOTE — ED Provider Notes (Addendum)
J. tube has been repositioned by radiology. She is resting comfortably at this point. She is discharged with prescription for promethazine.  Delora Fuel, MD [id number]  When it came time for discharge, patient was noted to be tachycardic. She did not have any fluids because of the malposition of her feeding tube and lack of IV access. Also, blood sugar was elevated. An IV was successfully started she was given IV fluids and fluids were started through her feeding tube. Heart rate has come down the blood sugar has come down. She is now stable for discharge.  Delora Fuel, MD [id number]

## 2013-01-18 NOTE — ED Notes (Signed)
Pt in c/o chest pain, abd pain, n/v, history of gastroparesis and pancreatitis, also recent pneumonia, pt refused ASA en route, pt received nitro x1 en route but refused further treatment.

## 2013-01-18 NOTE — ED Notes (Signed)
Patient still vomiting.   Patient was given medications for pain and nausea.  She had taken blood pressure cuff off.  Will get vitals once vomiting has calmed down.

## 2013-01-18 NOTE — ED Notes (Signed)
Dr Roxanne Mins aware of pt glucose and vitals. He would like pt held until vitals stableized and glucose improves. Pt is agreeable

## 2013-01-18 NOTE — H&P (Signed)
[name] is an 42 y.o. female.   Chief Complaint: Gastric tube was converted to [place] tube 03/09/2012 in IR Has been using at home without problem Til last night; J port is coming out of mouth Scheduled now for exchange and repositioning HPI: gastroparesis; malnutrition; HTN; DM; asthma; GERD; CAD/MI; Sz; pancreatitis; HDL  Past Medical History  Diagnosis Date  . Hypertension   . Diabetes mellitus   . Gastroparesis   . Asthma   . GERD (gastroesophageal reflux disease)   . Coronary artery disease   . h/o seizure   . Neuropathy   . Chronic pancreatitis   . Dyslipidemia   . MI (myocardial infarction)   . COPD (chronic obstructive pulmonary disease)   . Seizures     Past Surgical History  Procedure Laterality Date  . Abdominal hysterectomy    . Cholecystectomy    . Peg tube x 4      feeding jejunostomies with PEG tubes  . Cesarean section    . Colon resection due to diverticulitis    . Back surgery    . Esophagogastroduodenoscopy N/A 12/27/2012    Procedure: ESOPHAGOGASTRODUODENOSCOPY (EGD);  Surgeon: Missy Sabins, MD;  Location: [facility];  Service: Endoscopy;  Laterality: N/A;    Family History  Problem Relation Age of Onset  . Cancer Mother    Social History:  reports that she has been smoking Cigarettes.  She has a .5 pack-year smoking history. She has never used smokeless tobacco. She reports that she does not drink alcohol or use illicit drugs.  Allergies:  Allergies  Allergen Reactions  . Penicillins Hives  . Cefadroxil     Not sure of effects-listed as allergy on list from home  . Compazine Hives  . Darvocet (Propoxyphene-Acetaminophen) Other (See Comments)    UNKNOWN  . Nsaids Hives  . Prochlorperazine     Not sure of effects-listed as allergy on list from home  . Zofran Hives  . Zofran (Ondansetron) Hives     (Not in a hospital admission)  Results for orders placed during the hospital encounter of 01/18/13 (from the past 48 hour(s))   CBC WITH DIFFERENTIAL     Status: Abnormal   Collection Time    01/18/13  1:30 AM      Result Value Range   WBC 13.6 (*) 4.0 - 10.5 K/uL   RBC 4.00  3.87 - 5.11 MIL/uL   Hemoglobin 12.1  12.0 - 15.0 g/dL   HCT 34.6 (*) 36.0 - 46.0 %   MCV 86.5  78.0 - 100.0 fL   MCH 30.3  26.0 - 34.0 pg   MCHC 35.0  30.0 - 36.0 g/dL   RDW 14.1  11.5 - 15.5 %   Platelets 300  150 - 400 K/uL   Neutrophils Relative % 74  43 - 77 %   Neutro Abs 10.1 (*) 1.7 - 7.7 K/uL   Lymphocytes Relative 21  12 - 46 %   Lymphs Abs 2.8  0.7 - 4.0 K/uL   Monocytes Relative 4  3 - 12 %   Monocytes Absolute 0.5  0.1 - 1.0 K/uL   Eosinophils Relative 1  0 - 5 %   Eosinophils Absolute 0.1  0.0 - 0.7 K/uL   Basophils Relative 0  0 - 1 %   Basophils Absolute 0.1  0.0 - 0.1 K/uL  COMPREHENSIVE METABOLIC PANEL     Status: Abnormal   Collection Time    01/18/13  1:30 AM  Result Value Range   Sodium 134 (*) 135 - 145 mEq/L   Potassium 3.7  3.5 - 5.1 mEq/L   Chloride 96  96 - 112 mEq/L   CO2 29  19 - 32 mEq/L   Glucose, Bld 446 (*) 70 - 99 mg/dL   BUN 12  6 - 23 mg/dL   Creatinine, Ser 0.49 (*) 0.50 - 1.10 mg/dL   Calcium 9.7  8.4 - 10.5 mg/dL   Total Protein 7.5  6.0 - 8.3 g/dL   Albumin 3.0 (*) 3.5 - 5.2 g/dL   AST 14  0 - 37 U/L   ALT 7  0 - 35 U/L   Alkaline Phosphatase 123 (*) 39 - 117 U/L   Total Bilirubin 0.2 (*) 0.3 - 1.2 mg/dL   GFR calc non Af Amer >90  >90 mL/min   GFR calc Af Amer >90  >90 mL/min   Comment:            The eGFR has been calculated     using the CKD EPI equation.     This calculation has not been     validated in all clinical     situations.     eGFR's persistently     <90 mL/min signify     possible Chronic Kidney Disease.  LIPASE, BLOOD     Status: None   Collection Time    01/18/13  1:30 AM      Result Value Range   Lipase 28  11 - 59 U/L  POCT I-STAT TROPONIN I     Status: None   Collection Time    01/18/13  1:30 AM      Result Value Range   Troponin i, poc 0.00   0.00 - 0.08 ng/mL   Comment 3            Comment: Due to the release kinetics of cTnI,     a negative result within the first hours     of the onset of symptoms does not rule out     myocardial infarction with certainty.     If myocardial infarction is still suspected,     repeat the test at appropriate intervals.   Dg Chest Portable 1 View  01/18/2013   *RADIOLOGY REPORT*  Clinical Data: Chest pain.  PORTABLE CHEST - 1 VIEW  Comparison: Chest x-ray 03/06/2012.  Findings: Lung volumes are normal.  No consolidative airspace disease.  No pleural effusions.  No pneumothorax.  No pulmonary nodule or mass noted.  Pulmonary vasculature and the cardiomediastinal silhouette are within normal limits.  IMPRESSION: 1. No radiographic evidence of acute cardiopulmonary disease.   Original Report Authenticated By: Vinnie Langton, M.D.   Dg Abd Portable 1v  01/18/2013   *RADIOLOGY REPORT*  Clinical Data: Abdominal pain.  Emesis.  PORTABLE ABDOMEN - 1 VIEW  Comparison: Abdominal radiograph 02/25/2012.  Findings: A percutaneous gastrostomy tube is seen projecting over the left upper quadrant of the abdomen.  Some gas and stool are noted in the splenic flexure of the colon.  No pathologic distension of small bowel.  No gross pneumoperitoneum on this single-view examination.  Surgical clips project over the right upper quadrant of the abdomen, compatible with prior cholecystectomy.  Interbody spacers are noted in the lower lumbar spine.  IMPRESSION: 1.  Nonspecific, nonobstructive bowel gas pattern, as above.   Original Report Authenticated By: Vinnie Langton, M.D.    Review of Systems  Constitutional: Negative for fever.  Gastrointestinal: Positive  for nausea and vomiting.  Neurological: Positive for weakness.    Blood pressure 148/102, pulse 116, resp. rate 25, SpO2 98.00%. Physical Exam  Constitutional: She appears well-developed.  tearful  HENT:  Visible distal end of J tube from mouth   Cardiovascular: Normal rate and regular rhythm.   Respiratory: Effort normal and breath sounds normal. She has no wheezes.  GI: Soft. Bowel sounds are normal.  Musculoskeletal: Normal range of motion.  Neurological: She is alert.  Psychiatric: She has a normal mood and affect. Her behavior is normal. Judgment and thought content normal.     Assessment/Plan Gastroparesis; malnutrition G to [place] 03/09/12 - Dr Laurence Ferrari Now malpositioned Scheduled for exchange and repositioning Pt aware of procedure benefits and risks and agreeable to proceed Consent signed and in chart  Benett Swoyer A 01/18/2013, 9:02 AM

## 2013-01-18 NOTE — ED Provider Notes (Signed)
Pt given dilaudid and ativan--heart rate and bp stable for d/c  Leota Jacobsen, MD 01/18/13 1931

## 2013-01-18 NOTE — ED Notes (Signed)
Radiology PA-C at bedside to consent patient for IR procedure.

## 2013-01-18 NOTE — ED Notes (Signed)
Report given to Tillman Abide, RN.  MD notified of patient with pain and nausea continued.  Patient to be moved to [place].

## 2013-01-19 ENCOUNTER — Emergency Department (HOSPITAL_COMMUNITY): Payer: Medicare Other

## 2013-01-19 ENCOUNTER — Inpatient Hospital Stay (HOSPITAL_COMMUNITY)
Admission: EM | Admit: 2013-01-19 | Discharge: 2013-01-22 | DRG: 074 | Disposition: A | Discharge status: Home / Self Care | Payer: Medicare Other | Attending: Family Medicine | Admitting: Family Medicine

## 2013-01-19 DIAGNOSIS — K920 Hematemesis: Secondary | ICD-10-CM | POA: Diagnosis present

## 2013-01-19 DIAGNOSIS — N390 Urinary tract infection, site not specified: Secondary | ICD-10-CM

## 2013-01-19 DIAGNOSIS — E785 Hyperlipidemia, unspecified: Secondary | ICD-10-CM

## 2013-01-19 DIAGNOSIS — K5900 Constipation, unspecified: Secondary | ICD-10-CM

## 2013-01-19 DIAGNOSIS — E871 Hypo-osmolality and hyponatremia: Secondary | ICD-10-CM

## 2013-01-19 DIAGNOSIS — K567 Ileus, unspecified: Secondary | ICD-10-CM

## 2013-01-19 DIAGNOSIS — N179 Acute kidney failure, unspecified: Secondary | ICD-10-CM

## 2013-01-19 DIAGNOSIS — J449 Chronic obstructive pulmonary disease, unspecified: Secondary | ICD-10-CM | POA: Diagnosis present

## 2013-01-19 DIAGNOSIS — E1149 Type 2 diabetes mellitus with other diabetic neurological complication: Principal | ICD-10-CM | POA: Diagnosis present

## 2013-01-19 DIAGNOSIS — E663 Overweight: Secondary | ICD-10-CM | POA: Diagnosis present

## 2013-01-19 DIAGNOSIS — R112 Nausea with vomiting, unspecified: Secondary | ICD-10-CM

## 2013-01-19 DIAGNOSIS — IMO0002 Reserved for concepts with insufficient information to code with codable children: Secondary | ICD-10-CM | POA: Diagnosis present

## 2013-01-19 DIAGNOSIS — F17200 Nicotine dependence, unspecified, uncomplicated: Secondary | ICD-10-CM | POA: Diagnosis present

## 2013-01-19 DIAGNOSIS — E876 Hypokalemia: Secondary | ICD-10-CM | POA: Diagnosis present

## 2013-01-19 DIAGNOSIS — J4489 Other specified chronic obstructive pulmonary disease: Secondary | ICD-10-CM | POA: Diagnosis present

## 2013-01-19 DIAGNOSIS — E860 Dehydration: Secondary | ICD-10-CM | POA: Diagnosis present

## 2013-01-19 DIAGNOSIS — K3184 Gastroparesis: Secondary | ICD-10-CM | POA: Diagnosis present

## 2013-01-19 DIAGNOSIS — D72829 Elevated white blood cell count, unspecified: Secondary | ICD-10-CM

## 2013-01-19 DIAGNOSIS — I10 Essential (primary) hypertension: Secondary | ICD-10-CM | POA: Diagnosis present

## 2013-01-19 DIAGNOSIS — K219 Gastro-esophageal reflux disease without esophagitis: Secondary | ICD-10-CM

## 2013-01-19 DIAGNOSIS — R109 Unspecified abdominal pain: Secondary | ICD-10-CM | POA: Diagnosis present

## 2013-01-19 DIAGNOSIS — E1165 Type 2 diabetes mellitus with hyperglycemia: Secondary | ICD-10-CM | POA: Diagnosis present

## 2013-01-19 LAB — BASIC METABOLIC PANEL
CO2: 30 mEq/L (ref 19–32)
Calcium: 8.8 mg/dL (ref 8.4–10.5)
Chloride: 97 mEq/L (ref 96–112)
Glucose, Bld: 195 mg/dL — ABNORMAL HIGH (ref 70–99)
Potassium: 3.5 mEq/L (ref 3.5–5.1)
Sodium: 137 mEq/L (ref 135–145)

## 2013-01-19 LAB — CBC WITH DIFFERENTIAL/PLATELET
Basophils Absolute: 0 K/uL (ref 0.0–0.1)
Basophils Relative: 0 % (ref 0–1)
Eosinophils Absolute: 0 K/uL (ref 0.0–0.7)
Eosinophils Relative: 0 % (ref 0–5)
HCT: 34.1 % — ABNORMAL LOW (ref 36.0–46.0)
Hemoglobin: 11.2 g/dL — ABNORMAL LOW (ref 12.0–15.0)
Lymphocytes Relative: 26 % (ref 12–46)
Lymphs Abs: 2.6 K/uL (ref 0.7–4.0)
MCH: 28.8 pg (ref 26.0–34.0)
MCHC: 32.8 g/dL (ref 30.0–36.0)
MCV: 87.7 fL (ref 78.0–100.0)
Monocytes Absolute: 0.5 K/uL (ref 0.1–1.0)
Monocytes Relative: 5 % (ref 3–12)
Neutro Abs: 6.9 K/uL (ref 1.7–7.7)
Neutrophils Relative %: 69 % (ref 43–77)
Platelets: 267 K/uL (ref 150–400)
RBC: 3.89 MIL/uL (ref 3.87–5.11)
RDW: 14.4 % (ref 11.5–15.5)
WBC: 10 K/uL (ref 4.0–10.5)

## 2013-01-19 LAB — URINALYSIS, ROUTINE W REFLEX MICROSCOPIC
Bilirubin Urine: NEGATIVE
Glucose, UA: 100 mg/dL — AB
Ketones, ur: NEGATIVE mg/dL
Leukocytes, UA: NEGATIVE
Nitrite: NEGATIVE
Specific Gravity, Urine: 1.018 (ref 1.005–1.030)
pH: 7 (ref 5.0–8.0)

## 2013-01-19 LAB — LIPASE, BLOOD: Lipase: 13 U/L (ref 11–59)

## 2013-01-19 LAB — URINE MICROSCOPIC-ADD ON

## 2013-01-19 LAB — POCT I-STAT TROPONIN I: Troponin i, poc: 0.01 ng/mL (ref 0.00–0.08)

## 2013-01-19 MED ORDER — HYDROMORPHONE HCL PF 1 MG/ML IJ SOLN
0.5000 mg | INTRAMUSCULAR | Status: DC | PRN
  Administered 2013-01-20 – 2013-01-22 (×19): 1 mg via INTRAVENOUS
  Filled 2013-01-19 (×19): qty 1

## 2013-01-19 MED ORDER — SODIUM CHLORIDE 0.9 % IV BOLUS (SEPSIS)
1000.0000 mL | Freq: Once | INTRAVENOUS | Status: AC
  Administered 2013-01-19: 1000 mL via INTRAVENOUS

## 2013-01-19 MED ORDER — METOCLOPRAMIDE HCL 5 MG/ML IJ SOLN
10.0000 mg | Freq: Four times a day (QID) | INTRAMUSCULAR | Status: DC | PRN
  Administered 2013-01-19: 10 mg via INTRAVENOUS
  Filled 2013-01-19: qty 2

## 2013-01-19 MED ORDER — ZOLPIDEM TARTRATE 5 MG PO TABS
5.0000 mg | ORAL_TABLET | Freq: Every evening | ORAL | Status: DC | PRN
  Administered 2013-01-21: 5 mg via ORAL
  Filled 2013-01-19: qty 1

## 2013-01-19 MED ORDER — HYDROMORPHONE HCL PF 1 MG/ML IJ SOLN
1.0000 mg | INTRAMUSCULAR | Status: DC | PRN
  Administered 2013-01-19 (×2): 1 mg via INTRAVENOUS
  Filled 2013-01-19 (×2): qty 1

## 2013-01-19 MED ORDER — METOCLOPRAMIDE HCL 5 MG/ML IJ SOLN
10.0000 mg | Freq: Once | INTRAMUSCULAR | Status: AC
  Administered 2013-01-19: 10 mg via INTRAMUSCULAR
  Filled 2013-01-19: qty 2

## 2013-01-19 MED ORDER — ENOXAPARIN SODIUM 40 MG/0.4ML ~~LOC~~ SOLN
40.0000 mg | SUBCUTANEOUS | Status: DC
  Filled 2013-01-19 (×2): qty 0.4

## 2013-01-19 MED ORDER — METOCLOPRAMIDE HCL 5 MG/ML IJ SOLN: 10.0000 mg | Freq: Once | INTRAMUSCULAR | Status: DC

## 2013-01-19 MED ORDER — ALUM & MAG HYDROXIDE-SIMETH 200-200-20 MG/5ML PO SUSP: 30.0000 mL | Freq: Four times a day (QID) | ORAL | Status: DC | PRN

## 2013-01-19 MED ORDER — PROMETHAZINE HCL 25 MG/ML IJ SOLN
25.0000 mg | Freq: Four times a day (QID) | INTRAMUSCULAR | Status: DC | PRN
  Administered 2013-01-19 – 2013-01-22 (×8): 25 mg via INTRAVENOUS
  Filled 2013-01-19 (×9): qty 1

## 2013-01-19 MED ORDER — HYDROMORPHONE HCL PF 2 MG/ML IJ SOLN
2.0000 mg | Freq: Once | INTRAMUSCULAR | Status: AC
  Administered 2013-01-19: 2 mg via INTRAMUSCULAR
  Filled 2013-01-19: qty 1

## 2013-01-19 MED ORDER — PANTOPRAZOLE SODIUM 40 MG IV SOLR
40.0000 mg | INTRAVENOUS | Status: DC
  Administered 2013-01-20 – 2013-01-21 (×3): 40 mg via INTRAVENOUS
  Filled 2013-01-19 (×4): qty 40

## 2013-01-19 MED ORDER — ACETAMINOPHEN 650 MG RE SUPP: 650.0000 mg | Freq: Four times a day (QID) | RECTAL | Status: DC | PRN

## 2013-01-19 MED ORDER — SODIUM CHLORIDE 0.9 % IV SOLN: Freq: Once | INTRAVENOUS | Status: DC

## 2013-01-19 MED ORDER — SODIUM CHLORIDE 0.9 % IV SOLN
INTRAVENOUS | Status: DC
  Administered 2013-01-19: 21:00:00 via INTRAVENOUS
  Administered 2013-01-20: 100 mL/h via INTRAVENOUS
  Administered 2013-01-20: 07:00:00 via INTRAVENOUS

## 2013-01-19 MED ORDER — ACETAMINOPHEN 325 MG PO TABS: 650.0000 mg | ORAL_TABLET | Freq: Four times a day (QID) | ORAL | Status: DC | PRN

## 2013-01-19 MED ORDER — OXYCODONE HCL 5 MG PO TABS
5.0000 mg | ORAL_TABLET | ORAL | Status: DC | PRN
  Administered 2013-01-21 – 2013-01-22 (×3): 5 mg via ORAL
  Filled 2013-01-19 (×3): qty 1

## 2013-01-19 MED ORDER — INSULIN ASPART 100 UNIT/ML ~~LOC~~ SOLN
0.0000 [IU] | SUBCUTANEOUS | Status: DC
  Administered 2013-01-20 – 2013-01-21 (×3): 1 [IU] via SUBCUTANEOUS
  Administered 2013-01-21 – 2013-01-22 (×2): 2 [IU] via SUBCUTANEOUS
  Administered 2013-01-22 (×2): 1 [IU] via SUBCUTANEOUS

## 2013-01-19 MED ORDER — HYDRALAZINE HCL 20 MG/ML IJ SOLN
10.0000 mg | Freq: Four times a day (QID) | INTRAMUSCULAR | Status: DC | PRN
  Filled 2013-01-19: qty 0.5

## 2013-01-19 NOTE — ED Notes (Signed)
EDP unable to obtain peripheral IV line.  Per pt request, central line will be placed.  Ladora Daniel, RN aware and will get it set up.  Pt still unable to void at this time.

## 2013-01-19 NOTE — ED Notes (Signed)
Patient transported to X-ray by the rad tech who states she will be back in about 15 mins.

## 2013-01-19 NOTE — ED Notes (Signed)
[id number] Expected date:01/19/13<BR> Expected time:12:42 PM<BR> Means of arrival:<BR> Comments:<BR> abd pain

## 2013-01-19 NOTE — ED Notes (Signed)
Dr. Wilson Singer ordered to pull back on central line 2 cm.

## 2013-01-19 NOTE — ED Notes (Signed)
IV placed to LAC was SL by EDP.  Attempted to pull back blood for labs but blood wouldn't pull.  When flushed site, pt screamed and said "it hurts, it's not in right"   The arm did appear to puff up and was cold to touch.  EDP made aware.  Labs still unable to be drawn at this time.

## 2013-01-19 NOTE — ED Notes (Signed)
Called report to [facility].

## 2013-01-19 NOTE — ED Notes (Signed)
Pt brought back to the room by rad tech stating that pt says she can't do the xrays.  Pt is moaning, "please, please get the doctor, I need something for pain and nausea."  EDP is with another pt at this time.

## 2013-01-19 NOTE — H&P (Signed)
[facility] History and Physical  [name] [id number] DOB: [birth year] DOA: 01/19/2013  Referring physician:  EDP PCP: Martha Merino, MD  Specialists:   Chief Complaint: Nausea and Vomiting and ABD pain  HPI: [name] is a 42 y.o. female with Diabetic Gastroparesis who presents to the ED with complaints of worsening nausea , vomiting and epigastric ABD Pain over the past 3 days.  She reports not being able to hold down food or liquids or her medications.   She was seen in the ED 1 day ago for her symptoms and returned today due to continued worsening.  She also reports having hematemesis but denies passing melena.      Review of Systems: The patient denies anorexia, fever, chills, headaches, weight loss, vision loss, diplopia, dizziness, decreased hearing, rhinitis, hoarseness, chest pain, syncope, dyspnea on exertion, peripheral edema, balance deficits, cough, hemoptysis, constipation, melena, hematochezia, severe indigestion/heartburn, dysuria, hematuria, incontinence, muscle weakness, suspicious skin lesions, transient blindness, difficulty walking, depression, unusual weight change, abnormal bleeding, enlarged lymph nodes, angioedema, and breast masses.    Past Medical History  Diagnosis Date  . Hypertension   . Diabetes mellitus   . Gastroparesis   . Asthma   . GERD (gastroesophageal reflux disease)   . Coronary artery disease   . h/o seizure   . Neuropathy   . Chronic pancreatitis   . Dyslipidemia   . MI (myocardial infarction)   . COPD (chronic obstructive pulmonary disease)   . Seizures     Past Surgical History  Procedure Laterality Date  . Abdominal hysterectomy    . Cholecystectomy    . Peg tube x 4      feeding jejunostomies with PEG tubes  . Cesarean section    . Colon resection due to diverticulitis    . Back surgery    . Esophagogastroduodenoscopy N/A 12/27/2012    Procedure: ESOPHAGOGASTRODUODENOSCOPY (EGD);  Surgeon: Missy Sabins, MD;   Location: [facility];  Service: Endoscopy;  Laterality: N/A;    Prior to Admission medications   Medication Sig Start Date End Date Taking? Authorizing Provider  atorvastatin (LIPITOR) 40 MG tablet Take 1 tablet (40 mg total) by mouth daily at 6 PM. 12/29/12   Belkys A Regalado, MD  docusate sodium (COLACE) 100 MG capsule Take 100 mg by mouth 2 (two) times daily.    Historical Provider, MD  esomeprazole (NEXIUM) 40 MG capsule Take 1 capsule (40 mg total) by mouth 2 (two) times daily. 12/29/12   Belkys A Regalado, MD  Fluticasone-Salmeterol (ADVAIR) 250-50 MCG/DOSE AEPB Inhale 1 puff into the lungs 2 (two) times daily. 12/02/11   Robbie Lis, MD  gabapentin (NEURONTIN) 300 MG capsule Take 2 capsules (600 mg total) by mouth 3 (three) times daily. 12/29/12   Belkys A Regalado, MD  insulin glargine (LANTUS) 100 UNIT/ML injection Inject 0.1 mLs (10 Units total) into the skin at bedtime. 12/29/12   Belkys A Regalado, MD  insulin lispro (HUMALOG) 100 UNIT/ML injection Inject 10-20 Units into the skin 3 (three) times daily before meals. Sliding scale 12/02/11   Robbie Lis, MD  metoCLOPramide (REGLAN) 10 MG tablet Take 10 mg by mouth 4 (four) times daily.    Historical Provider, MD  metoprolol succinate (TOPROL XL) 50 MG 24 hr tablet Take 1 tablet (50 mg total) by mouth daily. Take with or immediately following a meal. 12/29/12   Belkys A Regalado, MD  Multiple Vitamin (MULITIVITAMIN WITH MINERALS) TABS Take 1 tablet by mouth daily.  12/02/11   Robbie Lis, MD  Nutritional Supplements (FEEDING SUPPLEMENT, OSMOLITE 1.2 CAL,) LIQD Place 1,000 mLs into feeding tube continuous. 52ml/hr continuously for 90 days 03/14/12   Bonnielee Haff, MD  oxyCODONE (OXY IR/ROXICODONE) 5 MG immediate release tablet Take 1-2 tablets (5-10 mg total) by mouth every 6 (six) hours as needed. 12/29/12   Belkys A Regalado, MD  promethazine (PHENERGAN) 12.5 MG tablet Take 1 tablet (12.5 mg total) by mouth every 6 (six) hours as needed.  12/29/12   Belkys A Regalado, MD  promethazine (PHENERGAN) 25 MG tablet Take 1 tablet (25 mg total) by mouth every 6 (six) hours as needed for nausea. [id number]   Delora Fuel, MD  zolpidem (AMBIEN) 10 MG tablet Take 10 mg by mouth at bedtime as needed for sleep.    Historical Provider, MD    Allergies  Allergen Reactions  . Penicillins Hives  . Cefadroxil     Not sure of effects-listed as allergy on list from home  . Compazine Hives  . Darvocet (Propoxyphene-Acetaminophen) Other (See Comments)    UNKNOWN  . Nsaids Hives  . Prochlorperazine     Not sure of effects-listed as allergy on list from home  . Zofran Hives  . Zofran (Ondansetron) Hives    Social History:  reports that she has been smoking Cigarettes.  She has a .5 pack-year smoking history. She has never used smokeless tobacco. She reports that she does not drink alcohol or use illicit drugs.     Family History  Problem Relation Age of Onset  . Cancer Mother       Physical Exam:  GEN:  Pleasant  42 y.o. Overweight African American female  examined  and in no acute distress; cooperative with exam Filed Vitals:   01/19/13 1300 01/19/13 1320 01/19/13 1429 01/19/13 1834  BP: 199/109 180/102 176/118 155/91  Pulse: 114 109 109 103  Temp: 99.7 F (37.6 C)   99.1 F (37.3 C)  TempSrc: Oral   Oral  Resp: 22 18 16    SpO2: 98% 96% 98% 96%   Blood pressure 155/91, pulse 103, temperature 99.1 F (37.3 C), temperature source Oral, resp. rate 16, SpO2 96.00%. PSYCH: She is alert and oriented x4; does not appear anxious does not appear depressed; affect is normal HEENT: Normocephalic and Atraumatic, Mucous membranes pink; PERRLA; EOM intact; Fundi:  Benign;  No scleral icterus, Nares: Patent, Oropharynx: Clear, Fair Dentition, Neck:  FROM, no cervical lymphadenopathy nor thyromegaly or carotid bruit; no JVD; Breasts:: Not examined CHEST WALL: No tenderness CHEST: Normal respiration, clear to auscultation bilaterally HEART:  Regular rate and rhythm; no murmurs rubs or gallops BACK: No kyphosis or scoliosis; no CVA tenderness ABDOMEN: Positive Bowel Sounds, Obese, soft non-tender; no masses, no organomegaly, no pannus; no intertriginous candida. Rectal Exam: Not done EXTREMITIES: No cyanosis, clubbing or edema; no ulcerations. Genitalia: not examined PULSES: 2+ and symmetric SKIN: Normal hydration no rash or ulceration CNS: Cranial nerves 2-12 grossly intact no focal neurologic deficit    Labs on Admission:  Basic Metabolic Panel:  Recent Labs Lab 01/18/13 0130 01/19/13 1832  NA 134* 137  K 3.7 3.5  CL 96 97  CO2 29 30  GLUCOSE 446* 195*  BUN 12 19  CREATININE 0.49* 0.74  CALCIUM 9.7 8.8   Liver Function Tests:  Recent Labs Lab 01/18/13 0130  AST 14  ALT 7  ALKPHOS 123*  BILITOT 0.2*  PROT 7.5  ALBUMIN 3.0*    Recent Labs Lab 01/18/13  0130 01/19/13 1832  LIPASE 28 13   No results found for this basename: AMMONIA,  in the last 168 hours CBC:  Recent Labs Lab 01/18/13 0130 01/19/13 1832  WBC 13.6* 10.0  NEUTROABS 10.1* 6.9  HGB 12.1 11.2*  HCT 34.6* 34.1*  MCV 86.5 87.7  PLT 300 267   Cardiac Enzymes: No results found for this basename: CKTOTAL, CKMB, CKMBINDEX, TROPONINI,  in the last 168 hours  BNP (last 3 results) No results found for this basename: PROBNP,  in the last 8760 hours CBG:  Recent Labs Lab 01/18/13 1321 01/18/13 1448 01/18/13 1637  GLUCAP 375* 334* 235*    Radiological Exams on Admission: Ir Replc Duoden/jejuno Tube Percut W/fluoro  01/18/2013   *RADIOLOGY REPORT*  Clinical Data:  Malpositioning of the gastrojejunal feeding catheter with expulsion of the jejunal portion of the catheter out of the patient's mouth.  GASTROJEJUNAL FEEDING TUBE EXCHANGE  Contrast:  20 ml Omnipaque-300  Fluoroscopy Time: 16 minutes, 54 seconds.  Procedure:  The procedure, risks, benefits, and alternatives were explained to the patient.  Questions regarding the procedure  were encouraged and answered.  The patient understands and consents to the procedure.  The preexisting gastrojejunal catheter was removed.  The gastric skin entry site was prepped with Betadine in a sterile fashion, and a sterile drape was applied covering the operative field.  A sterile gown and sterile gloves were used for the procedure.  A 5-French dilator was advanced into the stomach.  Contrast injection was performed to confirm intraluminal position.  Over various guide wires, 5-French catheter access was then established through the stomach into the duodenum.  A hydrophilic wire was advanced to the level of the proximal jejunum.  The percutaneous tract to the stomach was dilated up to 20-French. An 87 French balloon retention single-lumen gastrojejunal tube was then advanced.  Final catheter position was confirmed with a fluoroscopic spot image obtained after injection of contrast.  Complications:  None  Findings:  Due to tortuosity of the distal stomach and proximal duodenum, it was very difficult to gain durable guide wire access to the level of the jejunum.  Ultimately, a single-lumen balloon retention jejunostomy catheter was advanced and positioned with the tip in the proximal jejunum.  A standard 18 [place] G J-tube would not advance over a wire due to the tortuous course and friction over the guidewire.  IMPRESSION: Removal of malpositioned gastrojejunal catheter with placement of an 18-French balloon retention jejunostomy catheter through the stomach and to the level of the proximal jejunum.  This catheter may be used immediately.   Original Report Authenticated By: Aletta Edouard, M.D.   Dg Chest Portable 1 View  01/19/2013   *RADIOLOGY REPORT*  Clinical Data: Status post central venous catheter placement  PORTABLE CHEST - 1 VIEW  Comparison: 01/19/2013  Findings: Left IJ catheter has been placed.  The tip is in the right atrium.  Heart size is normal.  No pleural effusion or edema. No airspace  consolidation identified.  No pneumothorax identified.  IMPRESSION:  1.  Left IJ catheter tip is in the right atrium.   Original Report Authenticated By: Kerby Moors, M.D.   Dg Chest Portable 1 View  01/18/2013   *RADIOLOGY REPORT*  Clinical Data: Chest pain.  PORTABLE CHEST - 1 VIEW  Comparison: Chest x-ray 03/06/2012.  Findings: Lung volumes are normal.  No consolidative airspace disease.  No pleural effusions.  No pneumothorax.  No pulmonary nodule or mass noted.  Pulmonary vasculature and the  cardiomediastinal silhouette are within normal limits.  IMPRESSION: 1. No radiographic evidence of acute cardiopulmonary disease.   Original Report Authenticated By: Vinnie Langton, M.D.   Dg Abd Acute W/chest  01/19/2013   *RADIOLOGY REPORT*  Clinical Data: Abdominal pain.  Nausea and vomiting  ACUTE ABDOMEN SERIES (ABDOMEN 2 VIEW & CHEST 1 VIEW)  Comparison: 01/18/2013  Findings: Normal heart size.  No pleural effusion or edema identified.  Percutaneous gastrojejunostomy tube is identified. The bowel gas pattern appears nonobstructive.  There is enteric contrast material noted within the colon.  Cholecystectomy clips are present in the right upper quadrant.  IMPRESSION:  1.  Nonobstructive bowel gas pattern. 2.  Gastrojejunostomy tube in place.   Original Report Authenticated By: Kerby Moors, M.D.   Dg Abd Portable 1v  01/18/2013   *RADIOLOGY REPORT*  Clinical Data: Abdominal pain.  Emesis.  PORTABLE ABDOMEN - 1 VIEW  Comparison: Abdominal radiograph 02/25/2012.  Findings: A percutaneous gastrostomy tube is seen projecting over the left upper quadrant of the abdomen.  Some gas and stool are noted in the splenic flexure of the colon.  No pathologic distension of small bowel.  No gross pneumoperitoneum on this single-view examination.  Surgical clips project over the right upper quadrant of the abdomen, compatible with prior cholecystectomy.  Interbody spacers are noted in the lower lumbar spine.  IMPRESSION:  1.  Nonspecific, nonobstructive bowel gas pattern, as above.   Original Report Authenticated By: Vinnie Langton, M.D.       Assessment/Plan Principal Problem:   Gastroparesis Active Problems:   Nausea and vomiting   Dehydration   Abdominal pain   Hypertension   DM (diabetes mellitus), type 2, uncontrolled    1.   Gastroparesis-  IV Anti-Emetics PRN, IV Reglan.   2.   Nausea and Vomiting-  IV Phenergan PRN.    3.   Dehydration- IVFs  4.   ABD Pain- Pain Control PRN with IV Dilaudid.    5.   HTN- IV Hydralazine  PRN SBP > 160  6.  DM2- SSI PRN.       7.  Hematemesis- Hemeoccult Test Stools, H/H checks, and IV Protonix.     8.  SCDs for DVT prophylaxis.        Code Status:   FULL CODE Family Communication:   No Family Present Disposition Plan:    Return to Home  Time spent:  31 minutes  [facility] Pager [phone]  If 7PM-7AM, please contact night-coverage [email or web address] Password [facility] 01/19/2013, 9:33 PM

## 2013-01-19 NOTE — ED Notes (Signed)
Assisting EDP at b/s to try and start an ultrasound assisted IV.

## 2013-01-19 NOTE — ED Notes (Signed)
Pt grimacing entire time she is talking.  Attempted IV start x 2 w/o success.  Pt states she normally has to have one placed to her chest.

## 2013-01-19 NOTE — ED Notes (Signed)
ED secretary has stated that IV team called back and will be coming.  IV start attempted by charge RN w/o success as well.

## 2013-01-19 NOTE — ED Notes (Addendum)
EDMD at bedside

## 2013-01-19 NOTE — ED Notes (Signed)
IV team paged.  

## 2013-01-19 NOTE — ED Notes (Addendum)
Pt from home.  C/O abd pain, n/v and chest pain.  Pt was also at [facility] on this past Thurs w/same sx's but unadmitted.  She has long hx of gastroporesis.  Pt has an unfilled RX of phenergan.  She has been taking pain meds (oxycodone) but reported to EMS she can't keep them down.   BP 160/100, 118-120.  ST on EKG by EMS.  Pt has feeding tube that was just replaced at [facility] on Thurs.  Denies shob.  Has tenderness upon light palpation to upper belly by EMS.  CBG 116

## 2013-01-19 NOTE — ED Provider Notes (Signed)
History     CSN: [id number]  Arrival date & time 01/19/13  1244   First MD Initiated Contact with Patient 01/19/13 1248      Chief Complaint  Patient presents with  . Abdominal Pain    (Consider location/radiation/quality/duration/timing/severity/associated sxs/prior treatment) HPI Comments: Pt comes in with cc of abd pain. Has hx of gastroparesis. States that she has had abd pain x 2 days, constant, epigastric, radiating to the back, similar to her gastroparesis. She has hx of pancreatitis as well. Pt has had about 5-10 episodes of emesis since y'day, mostly clear with some blood mixed in. No diarrhea, last BM today. Pt seen in the ED with the same complains yday and was discharged in the afternoon.  Patient is a 42 y.o. female presenting with abdominal pain. The history is provided by the patient.  Abdominal Pain Associated symptoms include abdominal pain. Pertinent negatives include no chest pain and no shortness of breath.    Past Medical History  Diagnosis Date  . Hypertension   . Diabetes mellitus   . Gastroparesis   . Asthma   . GERD (gastroesophageal reflux disease)   . Coronary artery disease   . h/o seizure   . Neuropathy   . Chronic pancreatitis   . Dyslipidemia   . MI (myocardial infarction)   . COPD (chronic obstructive pulmonary disease)   . Seizures     Past Surgical History  Procedure Laterality Date  . Abdominal hysterectomy    . Cholecystectomy    . Peg tube x 4      feeding jejunostomies with PEG tubes  . Cesarean section    . Colon resection due to diverticulitis    . Back surgery    . Esophagogastroduodenoscopy N/A 12/27/2012    Procedure: ESOPHAGOGASTRODUODENOSCOPY (EGD);  Surgeon: Missy Sabins, MD;  Location: [facility];  Service: Endoscopy;  Laterality: N/A;    Family History  Problem Relation Age of Onset  . Cancer Mother     History  Substance Use Topics  . Smoking status: Current Every Day Smoker -- 0.25 packs/day for 2 years     Types: Cigarettes  . Smokeless tobacco: Never Used  . Alcohol Use: No     Comment: hx 1 beer daily -pt states she quit Dec. 2012    OB History   Grav Para Term Preterm Abortions TAB SAB Ect Mult Living                  Review of Systems  Constitutional: Positive for activity change.  Respiratory: Negative for cough, shortness of breath and wheezing.   Cardiovascular: Negative for chest pain.  Gastrointestinal: Positive for nausea, vomiting and abdominal pain. Negative for diarrhea, constipation, blood in stool and abdominal distention.  Genitourinary: Negative for hematuria and difficulty urinating.  Neurological: Positive for light-headedness.  Hematological: Does not bruise/bleed easily.    Allergies  Penicillins; Cefadroxil; Compazine; Darvocet; Nsaids; Prochlorperazine; Zofran; and Zofran  Home Medications   Current Outpatient Rx  Name  Route  Sig  Dispense  Refill  . atorvastatin (LIPITOR) 40 MG tablet   Oral   Take 1 tablet (40 mg total) by mouth daily at 6 PM.   30 tablet   0   . docusate sodium (COLACE) 100 MG capsule   Oral   Take 100 mg by mouth 2 (two) times daily.         Marland Kitchen esomeprazole (NEXIUM) 40 MG capsule   Oral   Take 1 capsule (40  mg total) by mouth 2 (two) times daily.   30 capsule   0   . Fluticasone-Salmeterol (ADVAIR) 250-50 MCG/DOSE AEPB   Inhalation   Inhale 1 puff into the lungs 2 (two) times daily.   60 each   0   . gabapentin (NEURONTIN) 300 MG capsule   Oral   Take 2 capsules (600 mg total) by mouth 3 (three) times daily.   30 capsule   0   . insulin glargine (LANTUS) 100 UNIT/ML injection   Subcutaneous   Inject 0.1 mLs (10 Units total) into the skin at bedtime.   10 mL   12   . insulin lispro (HUMALOG) 100 UNIT/ML injection   Subcutaneous   Inject 10-20 Units into the skin 3 (three) times daily before meals. Sliding scale   10 mL   2   . metoCLOPramide (REGLAN) 10 MG tablet   Oral   Take 10 mg by mouth 4 (four)  times daily.         . metoprolol succinate (TOPROL XL) 50 MG 24 hr tablet   Oral   Take 1 tablet (50 mg total) by mouth daily. Take with or immediately following a meal.   30 tablet   0   . Multiple Vitamin (MULITIVITAMIN WITH MINERALS) TABS   Oral   Take 1 tablet by mouth daily.   30 tablet   0   . Nutritional Supplements (FEEDING SUPPLEMENT, OSMOLITE 1.2 CAL,) LIQD   Per Tube   Place 1,000 mLs into feeding tube continuous. 10ml/hr continuously for 90 days   1000 mL   10   . oxyCODONE (OXY IR/ROXICODONE) 5 MG immediate release tablet   Oral   Take 1-2 tablets (5-10 mg total) by mouth every 6 (six) hours as needed.   30 tablet   0   . promethazine (PHENERGAN) 12.5 MG tablet   Oral   Take 1 tablet (12.5 mg total) by mouth every 6 (six) hours as needed.   30 tablet   0   . promethazine (PHENERGAN) 25 MG tablet   Oral   Take 1 tablet (25 mg total) by mouth every 6 (six) hours as needed for nausea.   30 tablet   0   . zolpidem (AMBIEN) 10 MG tablet   Oral   Take 10 mg by mouth at bedtime as needed for sleep.           BP 180/102  Pulse 109  Temp(Src) 99.7 F (37.6 C) (Oral)  Resp 18  SpO2 96%  Physical Exam  Nursing note and vitals reviewed. Constitutional: She is oriented to person, place, and time. She appears well-developed and well-nourished.  HENT:  Head: Normocephalic and atraumatic.  Eyes: EOM are normal. Pupils are equal, round, and reactive to light.  Neck: Neck supple.  Cardiovascular: Normal rate, regular rhythm and normal heart sounds.   No murmur heard. Pulmonary/Chest: Effort normal. No respiratory distress.  Abdominal: Soft. She exhibits no distension. There is tenderness. There is guarding. There is no rebound.  Epigastric tenderness  Neurological: She is alert and oriented to person, place, and time.  Skin: Skin is warm and dry.    ED Course  CENTRAL LINE Date/Time: 01/19/2013 6:05 PM Performed by: Varney Biles Authorized by:  Varney Biles Consent: Verbal consent obtained. Risks and benefits: risks, benefits and alternatives were discussed Consent given by: patient Patient understanding: patient states understanding of the procedure being performed Imaging studies: imaging studies available Required items: required blood products,  implants, devices, and special equipment available Patient identity confirmed: verbally with patient Time out: Immediately prior to procedure a "time out" was called to verify the correct patient, procedure, equipment, support staff and site/side marked as required. Indications: vascular access Anesthesia: local infiltration Local anesthetic: lidocaine 1% with epinephrine Anesthetic total: 5 ml Patient sedated: no Preparation: skin prepped with 2% chlorhexidine Skin prep agent dried: skin prep agent completely dried prior to procedure Sterile barriers: all five maximum sterile barriers used - cap, mask, sterile gown, sterile gloves, and large sterile sheet Hand hygiene: hand hygiene performed prior to central venous catheter insertion Location details: left internal jugular Site selection rationale: failed attempt at right side - thrombosis, unable to pass guide wire Patient position: Trendelenburg Catheter type: triple lumen Pre-procedure: landmarks identified Ultrasound guidance: yes Number of attempts: 1 Successful placement: yes Post-procedure: line sutured and dressing applied Assessment: blood return through all ports, free fluid flow, placement verified by x-ray and no pneumothorax on x-ray Patient tolerance: Patient tolerated the procedure well with no immediate complications. Comments: Attempted right sided IJ initially. Unable to thread wire due to distal thrombosis - which was confirmed with [place]. Small hematoma on the right side.   (including critical care time)  Labs Reviewed  BASIC METABOLIC PANEL  LIPASE, BLOOD  CBC WITH DIFFERENTIAL  URINALYSIS, ROUTINE W  REFLEX MICROSCOPIC   Ir Replc Duoden/jejuno Tube Percut W/fluoro  01/18/2013   *RADIOLOGY REPORT*  Clinical Data:  Malpositioning of the gastrojejunal feeding catheter with expulsion of the jejunal portion of the catheter out of the patient's mouth.  GASTROJEJUNAL FEEDING TUBE EXCHANGE  Contrast:  20 ml Omnipaque-300  Fluoroscopy Time: 16 minutes, 54 seconds.  Procedure:  The procedure, risks, benefits, and alternatives were explained to the patient.  Questions regarding the procedure were encouraged and answered.  The patient understands and consents to the procedure.  The preexisting gastrojejunal catheter was removed.  The gastric skin entry site was prepped with Betadine in a sterile fashion, and a sterile drape was applied covering the operative field.  A sterile gown and sterile gloves were used for the procedure.  A 5-French dilator was advanced into the stomach.  Contrast injection was performed to confirm intraluminal position.  Over various guide wires, 5-French catheter access was then established through the stomach into the duodenum.  A hydrophilic wire was advanced to the level of the proximal jejunum.  The percutaneous tract to the stomach was dilated up to 20-French. An 56 French balloon retention single-lumen gastrojejunal tube was then advanced.  Final catheter position was confirmed with a fluoroscopic spot image obtained after injection of contrast.  Complications:  None  Findings:  Due to tortuosity of the distal stomach and proximal duodenum, it was very difficult to gain durable guide wire access to the level of the jejunum.  Ultimately, a single-lumen balloon retention jejunostomy catheter was advanced and positioned with the tip in the proximal jejunum.  A standard 18 [place] G J-tube would not advance over a wire due to the tortuous course and friction over the guidewire.  IMPRESSION: Removal of malpositioned gastrojejunal catheter with placement of an 18-French balloon retention  jejunostomy catheter through the stomach and to the level of the proximal jejunum.  This catheter may be used immediately.   Original Report Authenticated By: Aletta Edouard, M.D.   Dg Chest Portable 1 View  01/18/2013   *RADIOLOGY REPORT*  Clinical Data: Chest pain.  PORTABLE CHEST - 1 VIEW  Comparison: Chest x-ray 03/06/2012.  Findings:  Lung volumes are normal.  No consolidative airspace disease.  No pleural effusions.  No pneumothorax.  No pulmonary nodule or mass noted.  Pulmonary vasculature and the cardiomediastinal silhouette are within normal limits.  IMPRESSION: 1. No radiographic evidence of acute cardiopulmonary disease.   Original Report Authenticated By: Vinnie Langton, M.D.   Dg Abd Portable 1v  01/18/2013   *RADIOLOGY REPORT*  Clinical Data: Abdominal pain.  Emesis.  PORTABLE ABDOMEN - 1 VIEW  Comparison: Abdominal radiograph 02/25/2012.  Findings: A percutaneous gastrostomy tube is seen projecting over the left upper quadrant of the abdomen.  Some gas and stool are noted in the splenic flexure of the colon.  No pathologic distension of small bowel.  No gross pneumoperitoneum on this single-view examination.  Surgical clips project over the right upper quadrant of the abdomen, compatible with prior cholecystectomy.  Interbody spacers are noted in the lower lumbar spine.  IMPRESSION: 1.  Nonspecific, nonobstructive bowel gas pattern, as above.   Original Report Authenticated By: Vinnie Langton, M.D.     No diagnosis found.    MDM  Pt comes in with cc of nausea, emesis and abd pain. Hx of gastroparesis. Seen y'day in the ED for the same Pt passed po challenge, and was sent home with pain control and f/u. Returns today with worsenng of her sx. AAS is negative.   I tried US guided IV - it blew. Central line placed. All labs pending. Dr. Wilson Singer taking over. +/- CT based on labs.  Will be admitted today.   Angiocath insertion Performed by: Varney Biles  Consent: Verbal  consent obtained. Risks and benefits: risks, benefits and alternatives were discussed Time out: Immediately prior to procedure a "time out" was called to verify the correct patient, procedure, equipment, support staff and site/side marked as required.  Preparation: Patient was prepped and draped in the usual sterile fashion.  Vein Location: right antecubital fossa  Ultrasound Guided  Gauge: 20 gauge  Normal blood return and flush without difficulty Patient tolerance: Patient tolerated the procedure well with no immediate complications.     Varney Biles, MD 01/19/13 1810

## 2013-01-20 ENCOUNTER — Encounter (HOSPITAL_COMMUNITY): Payer: Self-pay

## 2013-01-20 LAB — CBC
HCT: 29.2 % — ABNORMAL LOW (ref 36.0–46.0)
Hemoglobin: 10 g/dL — ABNORMAL LOW (ref 12.0–15.0)
MCHC: 34.2 g/dL (ref 30.0–36.0)
WBC: 7.3 10*3/uL (ref 4.0–10.5)

## 2013-01-20 LAB — HEMOGLOBIN AND HEMATOCRIT, BLOOD
HCT: 28.8 % — ABNORMAL LOW (ref 36.0–46.0)
Hemoglobin: 10 g/dL — ABNORMAL LOW (ref 12.0–15.0)
Hemoglobin: 9.6 g/dL — ABNORMAL LOW (ref 12.0–15.0)

## 2013-01-20 LAB — BASIC METABOLIC PANEL
BUN: 13 mg/dL (ref 6–23)
Calcium: 8.1 mg/dL — ABNORMAL LOW (ref 8.4–10.5)
Chloride: 99 mEq/L (ref 96–112)
Creatinine, Ser: 0.62 mg/dL (ref 0.50–1.10)
GFR calc Af Amer: >90 mL/min (ref >90)
GFR calc non Af Amer: >90 mL/min (ref >90)

## 2013-01-20 LAB — GLUCOSE, CAPILLARY
Glucose-Capillary: 86 mg/dL (ref 70–99)
Glucose-Capillary: 72 mg/dL (ref 70–99)

## 2013-01-20 LAB — HEMOGLOBIN A1C: Mean Plasma Glucose: 197 mg/dL — ABNORMAL HIGH (ref <117)

## 2013-01-20 MED ORDER — METOPROLOL TARTRATE 25 MG/10 ML ORAL SUSPENSION
25.0000 mg | Freq: Two times a day (BID) | ORAL | Status: DC
  Administered 2013-01-20 – 2013-01-22 (×5): 25 mg
  Filled 2013-01-20 (×6): qty 10

## 2013-01-20 MED ORDER — ENOXAPARIN SODIUM 40 MG/0.4ML ~~LOC~~ SOLN
40.0000 mg | SUBCUTANEOUS | Status: DC
  Administered 2013-01-20 – 2013-01-22 (×3): 40 mg via SUBCUTANEOUS
  Filled 2013-01-20 (×3): qty 0.4

## 2013-01-20 MED ORDER — HYDROMORPHONE HCL PF 1 MG/ML IJ SOLN: 1.0000 mg | INTRAMUSCULAR | Status: DC | PRN

## 2013-01-20 MED ORDER — POTASSIUM CHLORIDE 10 MEQ/100ML IV SOLN
10.0000 meq | INTRAVENOUS | Status: AC
  Administered 2013-01-20 (×2): 10 meq via INTRAVENOUS
  Filled 2013-01-20 (×2): qty 100

## 2013-01-20 MED ORDER — METOPROLOL TARTRATE 25 MG PO TABS: 25.0000 mg | ORAL_TABLET | Freq: Two times a day (BID) | ORAL | Status: DC

## 2013-01-20 MED ORDER — METOCLOPRAMIDE HCL 5 MG/ML IJ SOLN
10.0000 mg | Freq: Four times a day (QID) | INTRAMUSCULAR | Status: DC
  Administered 2013-01-20 – 2013-01-22 (×8): 10 mg via INTRAVENOUS
  Filled 2013-01-20 (×11): qty 2

## 2013-01-20 MED ORDER — SODIUM CHLORIDE 0.9 % IV SOLN: INTRAVENOUS | Status: DC

## 2013-01-20 NOTE — Progress Notes (Signed)
INITIAL NUTRITION ASSESSMENT  DOCUMENTATION CODES Per approved criteria  -Not Applicable   INTERVENTION: 1. Initiate Osmolite 1.2 @ 60 ml/hr via J-tube and increase by 10 ml every 6 hours to goal rate of 60 ml/hr. At goal rate, tube feeding regimen will provide 1728 kcal, 80 grams of protein, and 1094 ml of H2O.   Goal rate will meet 100% of calorie needs and 94% of protein needs.   NUTRITION DIAGNOSIS: Inadequate oral intake related to gastroparesis as evidenced by NPO status.   Goal: Pt to meet >/= 90% of their estimated nutrition needs  Monitor:  Weights, labs, diet advancement, TF tolerance  Reason for Assessment: Consult  42 y.o. female  Admitting Dx: Gastroparesis  ASSESSMENT: Pt with chronic gastroparesis and with recurrent nausea/vomiting. Pt recently has malpositioned gastrojejunostomy tube, which was replaced with an 66 [place] retention jejunostomy catheter on 01/18/13 by IR. Pt wants to wait until 6/23 to begin enteral nutrition.   Pt reports that she was tolerating her enteral regimen at home. Pt and RN confirmed that enteral nutrition will be held until the am.  Height: Ht Readings from Last 1 Encounters:  01/19/13 5\' 4"  (1.626 m)    Weight: Wt Readings from Last 1 Encounters:  01/19/13 145 lb 4.5 oz (65.9 kg)    Ideal Body Weight: 54.7 kg  % Ideal Body Weight: 120%   Wt Readings from Last 10 Encounters:  01/19/13 145 lb 4.5 oz (65.9 kg)  12/26/12 152 lb (68.947 kg)  12/26/12 152 lb (68.947 kg)  03/14/12 128 lb 1.4 oz (58.1 kg)  03/14/12 128 lb 1.4 oz (58.1 kg)  03/14/12 128 lb 1.4 oz (58.1 kg)  03/14/12 128 lb 1.4 oz (58.1 kg)  03/14/12 128 lb 1.4 oz (58.1 kg)  03/14/12 128 lb 1.4 oz (58.1 kg)  02/12/12 131 lb 6.3 oz (59.6 kg)    Usual Body Weight: unknown  % Usual Body Weight: uknown  BMI:  Body mass index is 24.93 kg/(m^2).  Estimated Nutritional Needs: Kcal: 1650-1850 Protein: 85-95 g Fluid: 1.7-1.9 L  Skin: WNL  Diet Order:  Clear Liquid  EDUCATION NEEDS: -No education needs identified at this time   Intake/Output Summary (Last 24 hours) at 01/20/13 1417 Last data filed at 01/20/13 1100  Gross per 24 hour  Intake     50 ml  Output      0 ml  Net     50 ml    Last BM: 12/27/12   Labs:   Recent Labs Lab 01/18/13 0130 01/19/13 1832 01/20/13 0205  NA 134* 137 135  K 3.7 3.5 3.3*  CL 96 97 99  CO2 29 30 29   BUN 12 19 13   CREATININE 0.49* 0.74 0.62  CALCIUM 9.7 8.8 8.1*  GLUCOSE 446* 195* 90    CBG (last 3)   Recent Labs  01/20/13 0400 01/20/13 0739 01/20/13 1144  GLUCAP 86 86 72    Scheduled Meds: . enoxaparin (LOVENOX) injection  40 mg Subcutaneous Q24H  . insulin aspart  0-9 Units Subcutaneous Q4H  . metoCLOPramide (REGLAN) injection  10 mg Intravenous Q6H  . metoprolol tartrate  25 mg Per Tube BID  . pantoprazole (PROTONIX) IV  40 mg Intravenous Q24H  . potassium chloride  10 mEq Intravenous Q1 Hr x 2    Continuous Infusions: . sodium chloride 100 mL/hr at 01/20/13 [id number]    Past Medical History  Diagnosis Date  . Hypertension   . Diabetes mellitus   . Gastroparesis   .  Asthma   . GERD (gastroesophageal reflux disease)   . Coronary artery disease   . h/o seizure   . Neuropathy   . Chronic pancreatitis   . Dyslipidemia   . MI (myocardial infarction)   . COPD (chronic obstructive pulmonary disease)   . Seizures     Past Surgical History  Procedure Laterality Date  . Abdominal hysterectomy    . Cholecystectomy    . Peg tube x 4      feeding jejunostomies with PEG tubes  . Cesarean section    . Colon resection due to diverticulitis    . Back surgery    . Esophagogastroduodenoscopy N/A 12/27/2012    Procedure: ESOPHAGOGASTRODUODENOSCOPY (EGD);  Surgeon: Missy Sabins, MD;  Location: [facility];  Service: Endoscopy;  Laterality: N/A;    Terrace Arabia RD, LDN

## 2013-01-20 NOTE — Progress Notes (Signed)
[facility] PROGRESS NOTE  [name] [id number] DOB: 01/21/1971 DOA: 01/19/2013 PCP: [name], MD  Assessment/Plan: 1. Chronic gastroparesis- patient has chronic gastroparesis and with recurrent nausea vomiting. Has been seen by either GI in the past, called and discussed with Dr. Michail Sermon, he does not feel that GI he has anything new to offer except symptomatic management. We'll change the Reglan to 10 mg IV every 6 hours. 2. Nutrition- patient had malpositioned gastrojejunostomy tube, which was replaced by 20 [place] retention jejunostomy catheter on 01/18/2013 by IR. The catheter is ready for use, patient wants to wait one more day before starting nutrition. We'll get dietary consult in the a.m. 3. Hypertension- patient was taking by mouth meds metoprolol 50 mg by mouth daily, will start her on metoprolol 25 twice a day through the G-tube. 4. Diabetes mellitus-patient is currently n.p.o. we'll continue to monitor the blood glucose and sliding scale insulin. 5. DVT prophylaxis-Lovenox.  Code Status: Full code Family Communication:Discussed with patient in detail Disposition Plan: Home when stable   Consultants:  None  Procedures:  None  Antibiotics:  None  HPI/Subjective: Patient came to the ED with nausea vomiting and abdominal pain. She recently had J-tube replaced by radiology at [facility]. Patient wants to wait one more day before starting the tube feedings.  Objective: Filed Vitals:   01/19/13 1320 01/19/13 1429 01/19/13 1834 01/19/13 2155  BP: 180/102 176/118 155/91 124/64  Pulse: 109 109 103 94  Temp:   99.1 F (37.3 C) 98.3 F (36.8 C)  TempSrc:   Oral Oral  Resp: 18 16  18   Height:    5\' 4"  (1.626 m)  Weight:    65.9 kg (145 lb 4.5 oz)  SpO2: 96% 98% 96% 100%   No intake or output data in the 24 hours ending 01/20/13 1047 Filed Weights   01/19/13 2155  Weight: 65.9 kg (145 lb 4.5 oz)    Exam:   General:  Appearing in no acute  distress  Cardiovascular: S1-S2 regular  Respiratory: Clear bilaterally  Abdomen: Soft nontender  Musculoskeletal: No edema of the lower extremities   Data Reviewed: Basic Metabolic Panel:  Recent Labs Lab 01/18/13 0130 01/19/13 1832 01/20/13 0205  NA 134* 137 135  K 3.7 3.5 3.3*  CL 96 97 99  CO2 29 30 29   GLUCOSE 446* 195* 90  BUN 12 19 13   CREATININE 0.49* 0.74 0.62  CALCIUM 9.7 8.8 8.1*   Liver Function Tests:  Recent Labs Lab 01/18/13 0130  AST 14  ALT 7  ALKPHOS 123*  BILITOT 0.2*  PROT 7.5  ALBUMIN 3.0*    Recent Labs Lab 01/18/13 0130 01/19/13 1832  LIPASE 28 13   No results found for this basename: AMMONIA,  in the last 168 hours CBC:  Recent Labs Lab 01/18/13 0130 01/19/13 1832 01/20/13 0025 01/20/13 0205  WBC 13.6* 10.0  --  7.3  NEUTROABS 10.1* 6.9  --   --   HGB 12.1 11.2* 10.0* 10.0*  HCT 34.6* 34.1* 30.2* 29.2*  MCV 86.5 87.7  --  88.0  PLT 300 267  --  234   Cardiac Enzymes: No results found for this basename: CKTOTAL, CKMB, CKMBINDEX, TROPONINI,  in the last 168 hours BNP (last 3 results) No results found for this basename: PROBNP,  in the last 8760 hours CBG:  Recent Labs Lab 01/18/13 1448 01/18/13 1637 01/20/13 0020 01/20/13 0400 01/20/13 0739  GLUCAP 334* 235* 133* 86 86    No  results found for this or any previous visit (from the past 240 hour(s)).   Studies: Ir Replc Duoden/jejuno Tube Percut W/fluoro  01/18/2013   *RADIOLOGY REPORT*  Clinical Data:  Malpositioning of the gastrojejunal feeding catheter with expulsion of the jejunal portion of the catheter out of the patient's mouth.  GASTROJEJUNAL FEEDING TUBE EXCHANGE  Contrast:  20 ml Omnipaque-300  Fluoroscopy Time: 16 minutes, 54 seconds.  Procedure:  The procedure, risks, benefits, and alternatives were explained to the patient.  Questions regarding the procedure were encouraged and answered.  The patient understands and consents to the procedure.  The  preexisting gastrojejunal catheter was removed.  The gastric skin entry site was prepped with Betadine in a sterile fashion, and a sterile drape was applied covering the operative field.  A sterile gown and sterile gloves were used for the procedure.  A 5-French dilator was advanced into the stomach.  Contrast injection was performed to confirm intraluminal position.  Over various guide wires, 5-French catheter access was then established through the stomach into the duodenum.  A hydrophilic wire was advanced to the level of the proximal jejunum.  The percutaneous tract to the stomach was dilated up to 20-French. An 40 French balloon retention single-lumen gastrojejunal tube was then advanced.  Final catheter position was confirmed with a fluoroscopic spot image obtained after injection of contrast.  Complications:  None  Findings:  Due to tortuosity of the distal stomach and proximal duodenum, it was very difficult to gain durable guide wire access to the level of the jejunum.  Ultimately, a single-lumen balloon retention jejunostomy catheter was advanced and positioned with the tip in the proximal jejunum.  A standard 18 [place] G J-tube would not advance over a wire due to the tortuous course and friction over the guidewire.  IMPRESSION: Removal of malpositioned gastrojejunal catheter with placement of an 18-French balloon retention jejunostomy catheter through the stomach and to the level of the proximal jejunum.  This catheter may be used immediately.   Original Report Authenticated By: Aletta Edouard, M.D.   Dg Chest Portable 1 View  01/19/2013   *RADIOLOGY REPORT*  Clinical Data: Status post central venous catheter placement  PORTABLE CHEST - 1 VIEW  Comparison: 01/19/2013  Findings: Left IJ catheter has been placed.  The tip is in the right atrium.  Heart size is normal.  No pleural effusion or edema. No airspace consolidation identified.  No pneumothorax identified.  IMPRESSION:  1.  Left IJ catheter tip  is in the right atrium.   Original Report Authenticated By: Kerby Moors, M.D.   Dg Abd Acute W/chest  01/19/2013   *RADIOLOGY REPORT*  Clinical Data: Abdominal pain.  Nausea and vomiting  ACUTE ABDOMEN SERIES (ABDOMEN 2 VIEW & CHEST 1 VIEW)  Comparison: 01/18/2013  Findings: Normal heart size.  No pleural effusion or edema identified.  Percutaneous gastrojejunostomy tube is identified. The bowel gas pattern appears nonobstructive.  There is enteric contrast material noted within the colon.  Cholecystectomy clips are present in the right upper quadrant.  IMPRESSION:  1.  Nonobstructive bowel gas pattern. 2.  Gastrojejunostomy tube in place.   Original Report Authenticated By: Kerby Moors, M.D.    Scheduled Meds: . sodium chloride   Intravenous Once  . insulin aspart  0-9 Units Subcutaneous Q4H  . pantoprazole (PROTONIX) IV  40 mg Intravenous Q24H   Continuous Infusions: . sodium chloride 100 mL/hr at 01/20/13 [id number]    Principal Problem:   Gastroparesis Active Problems:   Nausea  and vomiting   Hypertension   DM (diabetes mellitus), type 2, uncontrolled   Dehydration   Abdominal pain    Time spent: 25 min    [facility] Pager [phone]. If 7PM-7AM, please contact night-coverage at [email or web address], password [facility] 01/20/2013, 10:47 AM  LOS: 1 day

## 2013-01-21 DIAGNOSIS — E876 Hypokalemia: Secondary | ICD-10-CM

## 2013-01-21 LAB — BASIC METABOLIC PANEL
BUN: 6 mg/dL (ref 6–23)
Creatinine, Ser: 0.56 mg/dL (ref 0.50–1.10)
GFR calc Af Amer: >90 mL/min (ref >90)
GFR calc non Af Amer: >90 mL/min (ref >90)

## 2013-01-21 LAB — GLUCOSE, CAPILLARY
Glucose-Capillary: 96 mg/dL (ref 70–99)
Glucose-Capillary: 126 mg/dL — ABNORMAL HIGH (ref 70–99)
Glucose-Capillary: 185 mg/dL — ABNORMAL HIGH (ref 70–99)

## 2013-01-21 MED ORDER — SODIUM CHLORIDE 0.9 % IV SOLN
INTRAVENOUS | Status: DC
  Administered 2013-01-21 – 2013-01-22 (×2): via INTRAVENOUS
  Filled 2013-01-21 (×2): qty 1000

## 2013-01-21 MED ORDER — POTASSIUM CHLORIDE CRYS ER 20 MEQ PO TBCR
20.0000 meq | EXTENDED_RELEASE_TABLET | Freq: Two times a day (BID) | ORAL | Status: DC
  Filled 2013-01-21 (×2): qty 1

## 2013-01-21 MED ORDER — OSMOLITE 1.2 CAL PO LIQD: 1000.0000 mL | ORAL | Status: DC

## 2013-01-21 MED ORDER — OSMOLITE 1.2 CAL PO LIQD
1000.0000 mL | ORAL | Status: AC
  Administered 2013-01-21: 1000 mL

## 2013-01-21 NOTE — Evaluation (Signed)
Physical Therapy Evaluation Patient Details Name: [name] MRN: [id number] DOB: 1970/10/23 Today's Date: 01/21/2013 Time: [id number] PT Time Calculation (min): 10 min  PT Assessment / Plan / Recommendation Clinical Impression  Pt is a 42 year old female admitted for chronic gastroparesis with recurrent nausea vomiting.  Pt would benefit from acute PT services in order to improve independence with mobility and return to PLOF to prepare for d/c home with family.  Recommend RW for home and pt also requests shower chair.    PT Assessment  Patient needs continued PT services    Follow Up Recommendations  No PT follow up    Does the patient have the potential to tolerate intense rehabilitation      Barriers to Discharge        Equipment Recommendations  Rolling walker with 5" wheels;None recommended by PT (shower chair)    Recommendations for Other Services     Frequency Min 3X/week    Precautions / Restrictions Precautions Precautions: None   Pertinent Vitals/Pain Pt reports abdominal pain unchanged.  Increased mobility and repositioned in recliner to comfort.      Mobility  Bed Mobility Bed Mobility: Supine to Sit Supine to Sit: 6: Modified independent (Device/Increase time) Transfers Transfers: Sit to Stand;Stand to Sit Sit to Stand: 5: Supervision;With upper extremity assist;From bed Stand to Sit: 5: Supervision;With upper extremity assist;To chair/3-in-1 Ambulation/Gait Ambulation/Gait Assistance: 5: Supervision Ambulation Distance (Feet): 80 Feet Assistive device: Rolling walker Ambulation/Gait Assistance Details: pt with limited distance due to pain, dizziness and nausea, steady gait, verbal cues for safe use of RW including pushing RW not lifting and RW distance, also cues for posture Gait Pattern: Step-through pattern;Trunk flexed;Decreased stride length Gait velocity: decreased    Exercises     PT Diagnosis: Difficulty walking  PT Problem List: Decreased  activity tolerance;Decreased mobility;Pain;Decreased knowledge of use of DME PT Treatment Interventions: DME instruction;Gait training;Functional mobility training;Therapeutic activities;Therapeutic exercise;Patient/family education   PT Goals Acute Rehab PT Goals PT Goal Formulation: With patient Time For Goal Achievement: 01/28/13 Potential to Achieve Goals: Good Pt will Ambulate: >150 feet;with modified independence;with least restrictive assistive device PT Goal: Ambulate - Progress: Goal set today Pt will Perform Home Exercise Program: Independently PT Goal: Perform Home Exercise Program - Progress: Goal set today  Visit Information  Last PT Received On: 01/21/13 Assistance Needed: +1    Subjective Data  Subjective: I'll need a walker and shower chair.   Prior Functioning  Home Living Lives With: Family Available Help at Discharge: Family Type of Home: House Home Access: Level entry Home Layout: One level [place]: None Prior Function Level of Independence: Independent Communication Communication: No difficulties    Cognition  Cognition Arousal/Alertness: Awake/alert Behavior During Therapy: WFL for tasks assessed/performed Overall Cognitive Status: Within Functional Limits for tasks assessed    Extremity/Trunk Assessment Right Upper Extremity Assessment RUE ROM/Strength/Tone: [facility] for tasks assessed Left Upper Extremity Assessment LUE ROM/Strength/Tone: WFL for tasks assessed Right Lower Extremity Assessment RLE ROM/Strength/Tone: [facility] for tasks assessed Left Lower Extremity Assessment LLE ROM/Strength/Tone: WFL for tasks assessed   Balance    End of Session PT - End of Session Activity Tolerance: Patient limited by pain Patient left: in chair;with call bell/phone within reach Nurse Communication: Mobility status  GP     Chavonne Sforza,KATHrine E 01/21/2013, 2:19 PM Carmelia Bake, PT, DPT 01/21/2013 Pager: [phone]

## 2013-01-21 NOTE — Progress Notes (Signed)
[facility] PROGRESS NOTE  [name] [id number] DOB: 14-May-1971 DOA: 01/19/2013 PCP: [name], MD  Assessment/Plan: 1. Chronic gastroparesis- patient has chronic gastroparesis and with recurrent nausea vomiting. Has been seen by either GI in the past, called and discussed with Dr. Michail Sermon, he does not feel that GI he has anything new to offer except symptomatic management. We'll change the Reglan to 10 mg IV every 6 hours. 2. Nutrition- patient had malpositioned gastrojejunostomy tube, which was replaced by 28 [place] retention jejunostomy catheter on 01/18/2013 by IR. The catheter is ready for use, patient wants to wait one more day before starting nutrition. Patient has not been started on Osmolite per nutrition consult. 3. Hypertension- patient was taking by mouth meds metoprolol 50 mg by mouth daily, on metoprolol 25 twice a day through the G-tube. 4. Hypokalemia- replace potassium 5. Diabetes mellitus-patient is currently n.p.o. we'll continue to monitor the blood glucose and sliding scale insulin. 6. DVT prophylaxis-Lovenox.  Code Status: Full code Family Communication:Discussed with patient in detail Disposition Plan: Home when stable   Consultants:  None  Procedures:  None  Antibiotics:  None  HPI/Subjective: Patient came to the ED with nausea vomiting and abdominal pain. She recently had J-tube replaced by radiology at [facility]. patient again complains of abdominal pain.   Objective: Filed Vitals:   01/20/13 2200 01/21/13 0200 01/21/13 0500 01/21/13 1037  BP: 154/110 142/92 144/84 155/94  Pulse: 82 73 77 81  Temp: 98.1 F (36.7 C) 98.2 F (36.8 C) 98.5 F (36.9 C) 98.6 F (37 C)  TempSrc: Oral Oral Oral Oral  Resp: 20 18 20 18   Height:      Weight:      SpO2: 100% 100% 100% 100%    Intake/Output Summary (Last 24 hours) at 01/21/13 1117 Last data filed at 01/21/13 0900  Gross per 24 hour  Intake 961.67 ml  Output   2600 ml  Net  -1638.33 ml   Filed Weights   01/19/13 2155  Weight: 65.9 kg (145 lb 4.5 oz)    Exam:   General:  Appearing in no acute distress  Cardiovascular: S1-S2 regular  Respiratory: Clear bilaterally  Abdomen: Soft nondistended: Mild generalized tenderness on palpation no rigidity , mild guarding in the right upper quadrant  and epigastric region  Musculoskeletal: No edema of the lower extremities   Data Reviewed: Basic Metabolic Panel:  Recent Labs Lab 01/18/13 0130 01/19/13 1832 01/20/13 0205 01/21/13 0801  NA 134* 137 135 135  K 3.7 3.5 3.3* 3.4*  CL 96 97 99 103  CO2 29 30 29 27   GLUCOSE 446* 195* 90 138*  BUN 12 19 13 6   CREATININE 0.49* 0.74 0.62 0.56  CALCIUM 9.7 8.8 8.1* 8.2*   Liver Function Tests:  Recent Labs Lab 01/18/13 0130  AST 14  ALT 7  ALKPHOS 123*  BILITOT 0.2*  PROT 7.5  ALBUMIN 3.0*    Recent Labs Lab 01/18/13 0130 01/19/13 1832  LIPASE 28 13   No results found for this basename: AMMONIA,  in the last 168 hours CBC:  Recent Labs Lab 01/18/13 0130 01/19/13 1832 01/20/13 0025 01/20/13 0205 01/20/13 1515 01/20/13 2145  WBC 13.6* 10.0  --  7.3  --   --   NEUTROABS 10.1* 6.9  --   --   --   --   HGB 12.1 11.2* 10.0* 10.0* 9.6* 9.6*  HCT 34.6* 34.1* 30.2* 29.2* 28.8* 29.0*  MCV 86.5 87.7  --  88.0  --   --  PLT 300 267  --  234  --   --    Cardiac Enzymes: No results found for this basename: CKTOTAL, CKMB, CKMBINDEX, TROPONINI,  in the last 168 hours BNP (last 3 results) No results found for this basename: PROBNP,  in the last 8760 hours CBG:  Recent Labs Lab 01/20/13 1616 01/20/13 2010 01/20/13 2352 01/21/13 0601 01/21/13 0713  GLUCAP 105* 140* 129* 96 129*    No results found for this or any previous visit (from the past 240 hour(s)).   Studies: Dg Chest Portable 1 View  01/19/2013   *RADIOLOGY REPORT*  Clinical Data: Status post central venous catheter placement  PORTABLE CHEST - 1 VIEW  Comparison: 01/19/2013   Findings: Left IJ catheter has been placed.  The tip is in the right atrium.  Heart size is normal.  No pleural effusion or edema. No airspace consolidation identified.  No pneumothorax identified.  IMPRESSION:  1.  Left IJ catheter tip is in the right atrium.   Original Report Authenticated By: Kerby Moors, M.D.   Dg Abd Acute W/chest  01/19/2013   *RADIOLOGY REPORT*  Clinical Data: Abdominal pain.  Nausea and vomiting  ACUTE ABDOMEN SERIES (ABDOMEN 2 VIEW & CHEST 1 VIEW)  Comparison: 01/18/2013  Findings: Normal heart size.  No pleural effusion or edema identified.  Percutaneous gastrojejunostomy tube is identified. The bowel gas pattern appears nonobstructive.  There is enteric contrast material noted within the colon.  Cholecystectomy clips are present in the right upper quadrant.  IMPRESSION:  1.  Nonobstructive bowel gas pattern. 2.  Gastrojejunostomy tube in place.   Original Report Authenticated By: Kerby Moors, M.D.    Scheduled Meds: . enoxaparin (LOVENOX) injection  40 mg Subcutaneous Q24H  . insulin aspart  0-9 Units Subcutaneous Q4H  . metoCLOPramide (REGLAN) injection  10 mg Intravenous Q6H  . metoprolol tartrate  25 mg Per Tube BID  . pantoprazole (PROTONIX) IV  40 mg Intravenous Q24H   Continuous Infusions: . sodium chloride 100 mL/hr (01/20/13 2011)  . feeding supplement (OSMOLITE 1.2 CAL)    . [START ON 01/22/2013] feeding supplement (OSMOLITE 1.2 CAL)      Principal Problem:   Gastroparesis Active Problems:   Nausea and vomiting   Hypertension   DM (diabetes mellitus), type 2, uncontrolled   Dehydration   Abdominal pain    Time spent: 25 min    [facility] Pager [phone]. If 7PM-7AM, please contact night-coverage at [email or web address], password [email or web address] 01/21/2013, 11:17 AM  LOS: 2 days

## 2013-01-22 LAB — CBC
MCH: 29.1 pg (ref 26.0–34.0)
Platelets: 189 10*3/uL (ref 150–400)
RBC: 3.13 MIL/uL — ABNORMAL LOW (ref 3.87–5.11)
RDW: 13.7 % (ref 11.5–15.5)
WBC: 5.1 10*3/uL (ref 4.0–10.5)

## 2013-01-22 LAB — BASIC METABOLIC PANEL
Calcium: 8.4 mg/dL (ref 8.4–10.5)
Creatinine, Ser: 0.57 mg/dL (ref 0.50–1.10)
GFR calc non Af Amer: >90 mL/min (ref >90)
Glucose, Bld: 215 mg/dL — ABNORMAL HIGH (ref 70–99)
Sodium: 134 mEq/L — ABNORMAL LOW (ref 135–145)

## 2013-01-22 LAB — GLUCOSE, CAPILLARY
Glucose-Capillary: 120 mg/dL — ABNORMAL HIGH (ref 70–99)
Glucose-Capillary: 137 mg/dL — ABNORMAL HIGH (ref 70–99)
Glucose-Capillary: 192 mg/dL — ABNORMAL HIGH (ref 70–99)

## 2013-01-22 MED ORDER — FREE WATER: 200.0000 mL | Freq: Four times a day (QID) | Status: DC

## 2013-01-22 MED ORDER — AMLODIPINE BESYLATE 10 MG PO TABS: 10.0000 mg | ORAL_TABLET | Freq: Every day | ORAL | Status: DC

## 2013-01-22 MED ORDER — FREE WATER
200.0000 mL | Freq: Four times a day (QID) | Status: DC
  Administered 2013-01-22: 200 mL

## 2013-01-22 MED ORDER — OXYCODONE HCL 5 MG PO TABS: 5.0000 mg | ORAL_TABLET | Freq: Four times a day (QID) | ORAL | Status: DC | PRN

## 2013-01-22 MED ORDER — AMLODIPINE BESYLATE 10 MG PO TABS
10.0000 mg | ORAL_TABLET | Freq: Every day | ORAL | Status: DC
  Administered 2013-01-22: 10 mg via ORAL
  Filled 2013-01-22: qty 1

## 2013-01-22 NOTE — Care Management Note (Signed)
Cm spoke with patient at bedside concerning discharge planning. Per pt adult sister provides assistance with home care with G tube. Pt's enteral feeds provided by [facility] infusion. Patient refusing [facility] services. No other needs identified.   Venita Lick Terah Robey,RN,BSN [id number]

## 2013-01-22 NOTE — Discharge Summary (Signed)
Physician Discharge Summary  [name] [id number] DOB: 23-Aug-1970 DOA: 01/19/2013  PCP: [name], MD  Admit date: 01/19/2013 Discharge date: 01/22/2013  Time spent: 55 minutes  Recommendations for Outpatient Follow-up:  1. Follow up PCP in 2 weeks  Discharge Diagnoses:  Principal Problem:   Gastroparesis Active Problems:   Nausea and vomiting   Hypertension   DM (diabetes mellitus), type 2, uncontrolled   Dehydration   Abdominal pain   Discharge Condition: Stable  Diet recommendation: Tube feeds osmolite @ 40 cc /hr  Filed Weights   01/19/13 2155  Weight: 65.9 kg (145 lb 4.5 oz)    History of present illness:  42 y.o. female with Diabetic Gastroparesis who presents to the ED with complaints of worsening nausea , vomiting and epigastric ABD Pain over the past 3 days. She reports not being able to hold down food or liquids or her medications. She was seen in the ED 1 day ago for her symptoms and returned today due to continued worsening. She also reports having hematemesis but denies passing melena.      Hospital Course:  Chronic gastroparesis- patient has chronic gastroparesis and with recurrent nausea vomiting. Has been seen by either GI in the past, called and discussed with Dr. Michail Sermon, he does not feel that GI he has anything new to offer except symptomatic management. Reglan to 10 mg IV every 6 hours was given , at this time her pain is controlled, will send her home on Po oxycodone 5 mg q 4 hrs prn  Nutrition- patient had malpositioned gastrojejunostomy tube, which was replaced by 71 [place] retention jejunostomy catheter on 01/18/2013 by IR. The catheter is ready for use per IR. Patient has been  started on Osmolite per nutrition consult. Will continue with Osmolite tube feeds at home.  Hypertension- patient was taking by mouth meds metoprolol 50 mg by mouth daily, on metoprolol 25 twice a day through the G-tube.Will add amlodipine 10 mg po daily as BP is  elevated and not well controlled with metoprolol.   Hypokalemia- replaced potassium   Diabetes mellitus-Continue with lantus and isnulin lispro  Procedures:  None  Consultations:  None  Discharge Exam: Filed Vitals:   01/22/13 0414 01/22/13 0420 01/22/13 0924 01/22/13 1122  BP:  140/92 186/110 137/101  Pulse: 80  84   Temp: 98.1 F (36.7 C)     TempSrc: Oral     Resp: 18     Height:      Weight:      SpO2: 100%       General: Appear in no acute distress Cardiovascular: S1s2 RRR Respiratory: Clear bilaterally Ext : No edema  Discharge Instructions  Discharge Orders   Future Orders Complete By Expires     Diet - low sodium heart healthy  As directed     Increase activity slowly  As directed         Medication List    TAKE these medications       amLODipine 10 MG tablet  Commonly known as:  NORVASC  Take 1 tablet (10 mg total) by mouth daily.     atorvastatin 40 MG tablet  Commonly known as:  LIPITOR  Take 1 tablet (40 mg total) by mouth daily at 6 PM.     docusate sodium 100 MG capsule  Commonly known as:  COLACE  Take 100 mg by mouth 2 (two) times daily.     esomeprazole 40 MG capsule  Commonly known as:  NEXIUM  Take 1 capsule (40 mg total) by mouth 2 (two) times daily.     feeding supplement (OSMOLITE 1.2 CAL) Liqd  Place 1,000 mLs into feeding tube continuous. 36ml/hr continuously for 90 days     Fluticasone-Salmeterol 250-50 MCG/DOSE Aepb  Commonly known as:  ADVAIR  Inhale 1 puff into the lungs 2 (two) times daily.     gabapentin 300 MG capsule  Commonly known as:  NEURONTIN  Take 2 capsules (600 mg total) by mouth 3 (three) times daily.     insulin glargine 100 UNIT/ML injection  Commonly known as:  LANTUS  Inject 0.1 mLs (10 Units total) into the skin at bedtime.     insulin lispro 100 UNIT/ML injection  Commonly known as:  HUMALOG  Inject 10-20 Units into the skin 3 (three) times daily before meals. Sliding scale      metoCLOPramide 10 MG tablet  Commonly known as:  REGLAN  Take 10 mg by mouth 4 (four) times daily.     metoprolol succinate 50 MG 24 hr tablet  Commonly known as:  TOPROL XL  Take 1 tablet (50 mg total) by mouth daily. Take with or immediately following a meal.     multivitamin with minerals Tabs  Take 1 tablet by mouth daily.     oxyCODONE 5 MG immediate release tablet  Commonly known as:  Oxy IR/ROXICODONE  Take 1-2 tablets (5-10 mg total) by mouth every 6 (six) hours as needed.     promethazine 25 MG tablet  Commonly known as:  PHENERGAN  Take 1 tablet (25 mg total) by mouth every 6 (six) hours as needed for nausea.     zolpidem 10 MG tablet  Commonly known as:  AMBIEN  Take 10 mg by mouth at bedtime as needed for sleep.       Allergies  Allergen Reactions  . Penicillins Hives  . Cefadroxil     Not sure of effects-listed as allergy on list from home  . Compazine Hives  . Darvocet (Propoxyphene-Acetaminophen) Other (See Comments)    UNKNOWN  . Nsaids Hives  . Prochlorperazine     Not sure of effects-listed as allergy on list from home  . Zofran Hives  . Zofran (Ondansetron) Hives      The results of significant diagnostics from this hospitalization (including imaging, microbiology, ancillary and laboratory) are listed below for reference.    Significant Diagnostic Studies: Ct Abdomen Pelvis W Contrast  12/24/2012   *RADIOLOGY REPORT*  Clinical Data: Abdominal pain.  Nausea and vomiting.  Indwelling jejunal feeding tube.  Surgical history includes cholecystectomy, hysterectomy, and resection.  CT ABDOMEN AND PELVIS WITH CONTRAST  Technique:  Multidetector CT imaging of the abdomen and pelvis was performed following the standard protocol during bolus administration of intravenous contrast.  Contrast: 127mL OMNIPAQUE IOHEXOL 300 MG/ML IV. Oral contrast was also administered.  Comparison: CT abdomen and pelvis 02/26/2012.  Findings: Gastrostomy tube in the proximal body of  the stomach without complicating features.  Coaxial small bowel feeding tube is looped in the fourth portion of the duodenum with its tip back and the distal third portion of the duodenum.  Circumferential wall thickening involving the second portion of the duodenum, with luminal narrowing.  Small bowel anastomosis in the left upper quadrant is widely patent; mild dilation of this segment is secondary to the expected post operative atony.  Remaining small bowel normal in appearance.  Sigmoid colon extends upward into the left upper quadrant of the abdomen.  Entire  colon decompressed and unremarkable.  Normal appendix in the right upper pelvis.  No ascites.  Mild diffuse hepatic steatosis without focal hepatic parenchymal abnormality.  Gallbladder surgically absent, accounting for the mild intra and extrahepatic biliary ductal dilation.  Normal appearing spleen, pancreas, adrenal glands, and kidneys.  Moderate aorto-iliofemoral atherosclerosis.  No significant lymphadenopathy.  Uterus surgically absent.  Urinary bladder with diffuse mild wall thickening.  Numerous pelvic phleboliths.  No adnexal masses or free pelvic fluid.  Bone window images demonstrate solid Ray cage fusion at L4-5, degenerative changes involving the facet joints of the lower lumbar spine, and a Schmorl's node in the upper endplate of 624THL. Visualized lung bases clear.  Heart size normal with apparent prior LAD and diagonal coronary stents.  IMPRESSION:  1. Circumferential wall thickening involving the second portion of the duodenum over a long segment, most likely secondary to duodenitis.  Duodenal carcinoma is possible but felt less likely. 2.  Feeding tube looped in the fourth portion of the duodenum with its tip back in the third portion of the duodenum. 3.  No evidence of bowel obstruction. 4.  Mild diffuse thickening of the wall of the urinary bladder. This is nonspecific and can be seen with cystitis or chronic outlet obstruction. 5.  Mild  diffuse hepatic steatosis without focal hepatic parenchymal abnormality. 6.  Moderate aorto-iliofemoral atherosclerosis which is quite advanced for age.   Original Report Authenticated By: Evangeline Dakin, M.D.   US Renal  12/26/2012   *RADIOLOGY REPORT*  Clinical Data: Acute renal failure, hypertension, diabetes  RENAL/URINARY TRACT ULTRASOUND COMPLETE  Comparison:  CT abdomen pelvis of 12/24/2012  Findings:  Right Kidney:  No hydronephrosis is seen.  The right kidney measures 13.5 cm sagittally.  The parenchyma of the right kidney is echogenic suggesting chronic renal medical disease.  Correlation with renal laboratory values is recommended.  Left Kidney:  No hydronephrosis is noted.  The left kidney measures 15.1 cm sagittally.  The parenchyma also is echogenic.  Bladder:  The urinary bladder is unremarkable.  IMPRESSION:  1.  No hydronephrosis. 2.  Echogenic renal parenchyma consistent with chronic renal medical disease.  Correlate with renal laboratory values.   Original Report Authenticated By: Ivar Drape, M.D.   Ir Replc Duoden/jejuno Tube Percut W/fluoro  01/18/2013   *RADIOLOGY REPORT*  Clinical Data:  Malpositioning of the gastrojejunal feeding catheter with expulsion of the jejunal portion of the catheter out of the patient's mouth.  GASTROJEJUNAL FEEDING TUBE EXCHANGE  Contrast:  20 ml Omnipaque-300  Fluoroscopy Time: 16 minutes, 54 seconds.  Procedure:  The procedure, risks, benefits, and alternatives were explained to the patient.  Questions regarding the procedure were encouraged and answered.  The patient understands and consents to the procedure.  The preexisting gastrojejunal catheter was removed.  The gastric skin entry site was prepped with Betadine in a sterile fashion, and a sterile drape was applied covering the operative field.  A sterile gown and sterile gloves were used for the procedure.  A 5-French dilator was advanced into the stomach.  Contrast injection was performed to confirm  intraluminal position.  Over various guide wires, 5-French catheter access was then established through the stomach into the duodenum.  A hydrophilic wire was advanced to the level of the proximal jejunum.  The percutaneous tract to the stomach was dilated up to 20-French. An 64 French balloon retention single-lumen gastrojejunal tube was then advanced.  Final catheter position was confirmed with a fluoroscopic spot image obtained after injection of contrast.  Complications:  None  Findings:  Due to tortuosity of the distal stomach and proximal duodenum, it was very difficult to gain durable guide wire access to the level of the jejunum.  Ultimately, a single-lumen balloon retention jejunostomy catheter was advanced and positioned with the tip in the proximal jejunum.  A standard 18 [place] G J-tube would not advance over a wire due to the tortuous course and friction over the guidewire.  IMPRESSION: Removal of malpositioned gastrojejunal catheter with placement of an 18-French balloon retention jejunostomy catheter through the stomach and to the level of the proximal jejunum.  This catheter may be used immediately.   Original Report Authenticated By: Aletta Edouard, M.D.   Dg Chest Portable 1 View  01/19/2013   *RADIOLOGY REPORT*  Clinical Data: Status post central venous catheter placement  PORTABLE CHEST - 1 VIEW  Comparison: 01/19/2013  Findings: Left IJ catheter has been placed.  The tip is in the right atrium.  Heart size is normal.  No pleural effusion or edema. No airspace consolidation identified.  No pneumothorax identified.  IMPRESSION:  1.  Left IJ catheter tip is in the right atrium.   Original Report Authenticated By: Kerby Moors, M.D.   Dg Chest Portable 1 View  01/18/2013   *RADIOLOGY REPORT*  Clinical Data: Chest pain.  PORTABLE CHEST - 1 VIEW  Comparison: Chest x-ray 03/06/2012.  Findings: Lung volumes are normal.  No consolidative airspace disease.  No pleural effusions.  No pneumothorax.   No pulmonary nodule or mass noted.  Pulmonary vasculature and the cardiomediastinal silhouette are within normal limits.  IMPRESSION: 1. No radiographic evidence of acute cardiopulmonary disease.   Original Report Authenticated By: Vinnie Langton, M.D.   Dg Abd Acute W/chest  01/19/2013   *RADIOLOGY REPORT*  Clinical Data: Abdominal pain.  Nausea and vomiting  ACUTE ABDOMEN SERIES (ABDOMEN 2 VIEW & CHEST 1 VIEW)  Comparison: 01/18/2013  Findings: Normal heart size.  No pleural effusion or edema identified.  Percutaneous gastrojejunostomy tube is identified. The bowel gas pattern appears nonobstructive.  There is enteric contrast material noted within the colon.  Cholecystectomy clips are present in the right upper quadrant.  IMPRESSION:  1.  Nonobstructive bowel gas pattern. 2.  Gastrojejunostomy tube in place.   Original Report Authenticated By: Kerby Moors, M.D.   Dg Abd Portable 1v  01/18/2013   *RADIOLOGY REPORT*  Clinical Data: Abdominal pain.  Emesis.  PORTABLE ABDOMEN - 1 VIEW  Comparison: Abdominal radiograph 02/25/2012.  Findings: A percutaneous gastrostomy tube is seen projecting over the left upper quadrant of the abdomen.  Some gas and stool are noted in the splenic flexure of the colon.  No pathologic distension of small bowel.  No gross pneumoperitoneum on this single-view examination.  Surgical clips project over the right upper quadrant of the abdomen, compatible with prior cholecystectomy.  Interbody spacers are noted in the lower lumbar spine.  IMPRESSION: 1.  Nonspecific, nonobstructive bowel gas pattern, as above.   Original Report Authenticated By: Vinnie Langton, M.D.    Microbiology: No results found for this or any previous visit (from the past 240 hour(s)).   Labs: Basic Metabolic Panel:  Recent Labs Lab 01/18/13 0130 01/19/13 1832 01/20/13 0205 01/21/13 0801 01/22/13 0550  NA 134* 137 135 135 134*  K 3.7 3.5 3.3* 3.4* 3.7  CL 96 97 99 103 102  CO2 29 30 29 27 27    GLUCOSE 446* 195* 90 138* 215*  BUN 12 19 13 6  4*  CREATININE 0.49*  0.74 0.62 0.56 0.57  CALCIUM 9.7 8.8 8.1* 8.2* 8.4   Liver Function Tests:  Recent Labs Lab 01/18/13 0130  AST 14  ALT 7  ALKPHOS 123*  BILITOT 0.2*  PROT 7.5  ALBUMIN 3.0*    Recent Labs Lab 01/18/13 0130 01/19/13 1832  LIPASE 28 13   No results found for this basename: AMMONIA,  in the last 168 hours CBC:  Recent Labs Lab 01/18/13 0130 01/19/13 1832 01/20/13 0025 01/20/13 0205 01/20/13 1515 01/20/13 2145 01/22/13 0550  WBC 13.6* 10.0  --  7.3  --   --  5.1  NEUTROABS 10.1* 6.9  --   --   --   --   --   HGB 12.1 11.2* 10.0* 10.0* 9.6* 9.6* 9.1*  HCT 34.6* 34.1* 30.2* 29.2* 28.8* 29.0* 27.4*  MCV 86.5 87.7  --  88.0  --   --  87.5  PLT 300 267  --  234  --   --  189   Cardiac Enzymes: No results found for this basename: CKTOTAL, CKMB, CKMBINDEX, TROPONINI,  in the last 168 hours BNP: BNP (last 3 results) No results found for this basename: PROBNP,  in the last 8760 hours CBG:  Recent Labs Lab 01/21/13 2117 01/22/13 0001 01/22/13 0412 01/22/13 0723 01/22/13 1123  GLUCAP 167* 120* 134* 192* 137*       Signed:  Fermina Mishkin S  [facility] 01/22/2013, 1:40 PM

## 2013-01-22 NOTE — Progress Notes (Signed)
D/c instructions given to patient, verbalized understanding,stable,J tube flushed before patient was d/c home, no residuals,pain is controlled.Sandie Ano RN

## 2013-02-06 ENCOUNTER — Inpatient Hospital Stay (HOSPITAL_COMMUNITY)
Admission: EM | Admit: 2013-02-06 | Discharge: 2013-02-10 | DRG: 073 | Disposition: A | Discharge status: Home / Self Care | Payer: Medicare Other | Attending: Internal Medicine | Admitting: Internal Medicine

## 2013-02-06 ENCOUNTER — Encounter (HOSPITAL_COMMUNITY): Payer: Self-pay | Admitting: *Deleted

## 2013-02-06 ENCOUNTER — Ambulatory Visit (HOSPITAL_COMMUNITY): Payer: Medicare Other

## 2013-02-06 ENCOUNTER — Emergency Department (HOSPITAL_COMMUNITY): Payer: Medicare Other

## 2013-02-06 ENCOUNTER — Inpatient Hospital Stay (HOSPITAL_COMMUNITY): Payer: Medicare Other

## 2013-02-06 DIAGNOSIS — K226 Gastro-esophageal laceration-hemorrhage syndrome: Secondary | ICD-10-CM | POA: Diagnosis present

## 2013-02-06 DIAGNOSIS — Z880 Allergy status to penicillin: Secondary | ICD-10-CM

## 2013-02-06 DIAGNOSIS — R112 Nausea with vomiting, unspecified: Secondary | ICD-10-CM

## 2013-02-06 DIAGNOSIS — Z9049 Acquired absence of other specified parts of digestive tract: Secondary | ICD-10-CM

## 2013-02-06 DIAGNOSIS — E785 Hyperlipidemia, unspecified: Secondary | ICD-10-CM | POA: Diagnosis present

## 2013-02-06 DIAGNOSIS — R739 Hyperglycemia, unspecified: Secondary | ICD-10-CM

## 2013-02-06 DIAGNOSIS — J449 Chronic obstructive pulmonary disease, unspecified: Secondary | ICD-10-CM | POA: Diagnosis present

## 2013-02-06 DIAGNOSIS — I2510 Atherosclerotic heart disease of native coronary artery without angina pectoris: Secondary | ICD-10-CM | POA: Diagnosis present

## 2013-02-06 DIAGNOSIS — R109 Unspecified abdominal pain: Secondary | ICD-10-CM

## 2013-02-06 DIAGNOSIS — Z431 Encounter for attention to gastrostomy: Secondary | ICD-10-CM

## 2013-02-06 DIAGNOSIS — E1149 Type 2 diabetes mellitus with other diabetic neurological complication: Principal | ICD-10-CM | POA: Diagnosis present

## 2013-02-06 DIAGNOSIS — K3184 Gastroparesis: Secondary | ICD-10-CM

## 2013-02-06 DIAGNOSIS — J4489 Other specified chronic obstructive pulmonary disease: Secondary | ICD-10-CM | POA: Diagnosis present

## 2013-02-06 DIAGNOSIS — K861 Other chronic pancreatitis: Secondary | ICD-10-CM | POA: Diagnosis present

## 2013-02-06 DIAGNOSIS — I252 Old myocardial infarction: Secondary | ICD-10-CM

## 2013-02-06 DIAGNOSIS — E860 Dehydration: Secondary | ICD-10-CM

## 2013-02-06 DIAGNOSIS — Z794 Long term (current) use of insulin: Secondary | ICD-10-CM

## 2013-02-06 DIAGNOSIS — E876 Hypokalemia: Secondary | ICD-10-CM | POA: Diagnosis present

## 2013-02-06 DIAGNOSIS — I10 Essential (primary) hypertension: Secondary | ICD-10-CM | POA: Diagnosis present

## 2013-02-06 DIAGNOSIS — E1165 Type 2 diabetes mellitus with hyperglycemia: Secondary | ICD-10-CM

## 2013-02-06 DIAGNOSIS — K920 Hematemesis: Secondary | ICD-10-CM | POA: Diagnosis present

## 2013-02-06 DIAGNOSIS — F17200 Nicotine dependence, unspecified, uncomplicated: Secondary | ICD-10-CM | POA: Diagnosis present

## 2013-02-06 DIAGNOSIS — Z79899 Other long term (current) drug therapy: Secondary | ICD-10-CM

## 2013-02-06 DIAGNOSIS — K219 Gastro-esophageal reflux disease without esophagitis: Secondary | ICD-10-CM

## 2013-02-06 LAB — GLUCOSE, CAPILLARY: Glucose-Capillary: 370 mg/dL — ABNORMAL HIGH (ref 70–99)

## 2013-02-06 LAB — URINALYSIS, ROUTINE W REFLEX MICROSCOPIC
Bilirubin Urine: NEGATIVE
Ketones, ur: 40 mg/dL — AB
Leukocytes, UA: NEGATIVE
Nitrite: NEGATIVE
Protein, ur: >300 mg/dL — AB
Urobilinogen, UA: 0.2 mg/dL (ref 0.0–1.0)
pH: 7 (ref 5.0–8.0)

## 2013-02-06 LAB — COMPREHENSIVE METABOLIC PANEL
Alkaline Phosphatase: 149 U/L — ABNORMAL HIGH (ref 39–117)
BUN: 13 mg/dL (ref 6–23)
CO2: 25 mEq/L (ref 19–32)
GFR calc Af Amer: >90 mL/min (ref >90)
GFR calc non Af Amer: >90 mL/min (ref >90)
Glucose, Bld: 402 mg/dL — ABNORMAL HIGH (ref 70–99)
Potassium: 3 mEq/L — ABNORMAL LOW (ref 3.5–5.1)
Total Bilirubin: 0.3 mg/dL (ref 0.3–1.2)
Total Protein: 8.8 g/dL — ABNORMAL HIGH (ref 6.0–8.3)

## 2013-02-06 LAB — BLOOD GAS, VENOUS
Acid-Base Excess: 4.8 mmol/L — ABNORMAL HIGH (ref 0.0–2.0)
Bicarbonate: 27.4 mEq/L — ABNORMAL HIGH (ref 20.0–24.0)
Patient temperature: 98.6
TCO2: 23.9 mmol/L (ref 0–100)
pCO2, Ven: 34.3 mmHg — ABNORMAL LOW (ref 45.0–50.0)

## 2013-02-06 LAB — URINE MICROSCOPIC-ADD ON

## 2013-02-06 LAB — CBC WITH DIFFERENTIAL/PLATELET
Basophils Relative: 0 % (ref 0–1)
Eosinophils Absolute: 0 10*3/uL (ref 0.0–0.7)
HCT: 37 % (ref 36.0–46.0)
Hemoglobin: 13 g/dL (ref 12.0–15.0)
Lymphs Abs: 1.4 10*3/uL (ref 0.7–4.0)
MCH: 30 pg (ref 26.0–34.0)
MCHC: 35.1 g/dL (ref 30.0–36.0)
MCV: 85.5 fL (ref 78.0–100.0)
Monocytes Absolute: 0.4 10*3/uL (ref 0.1–1.0)
Monocytes Relative: 3 % (ref 3–12)
RBC: 4.33 MIL/uL (ref 3.87–5.11)

## 2013-02-06 LAB — HEMOGLOBIN A1C: Hgb A1c MFr Bld: 8.9 % — ABNORMAL HIGH (ref <5.7)

## 2013-02-06 LAB — OCCULT BLOOD, POC DEVICE: Fecal Occult Bld: NEGATIVE

## 2013-02-06 MED ORDER — HYDROMORPHONE HCL PF 1 MG/ML IJ SOLN
1.0000 mg | Freq: Once | INTRAMUSCULAR | Status: AC
  Administered 2013-02-06: 1 mg via INTRAVENOUS
  Filled 2013-02-06: qty 1

## 2013-02-06 MED ORDER — LORAZEPAM 2 MG/ML IJ SOLN
1.0000 mg | INTRAMUSCULAR | Status: AC
  Administered 2013-02-06: 1 mg via INTRAVENOUS
  Filled 2013-02-06: qty 1

## 2013-02-06 MED ORDER — IOHEXOL 300 MG/ML  SOLN: 100.0000 mL | Freq: Once | INTRAMUSCULAR | Status: AC | PRN

## 2013-02-06 MED ORDER — MOMETASONE FURO-FORMOTEROL FUM 100-5 MCG/ACT IN AERO
2.0000 | INHALATION_SPRAY | Freq: Two times a day (BID) | RESPIRATORY_TRACT | Status: DC
  Administered 2013-02-06 – 2013-02-10 (×7): 2 via RESPIRATORY_TRACT
  Filled 2013-02-06: qty 8.8

## 2013-02-06 MED ORDER — METOCLOPRAMIDE HCL 5 MG/ML IJ SOLN
10.0000 mg | Freq: Four times a day (QID) | INTRAMUSCULAR | Status: DC
  Administered 2013-02-06 – 2013-02-10 (×16): 10 mg via INTRAVENOUS
  Filled 2013-02-06 (×20): qty 2

## 2013-02-06 MED ORDER — METOPROLOL SUCCINATE ER 50 MG PO TB24
50.0000 mg | ORAL_TABLET | Freq: Every day | ORAL | Status: DC
  Filled 2013-02-06 (×3): qty 1

## 2013-02-06 MED ORDER — ZOLPIDEM TARTRATE 5 MG PO TABS
5.0000 mg | ORAL_TABLET | Freq: Every evening | ORAL | Status: DC | PRN
  Administered 2013-02-09: 5 mg via ORAL
  Filled 2013-02-06: qty 1

## 2013-02-06 MED ORDER — METOCLOPRAMIDE HCL 5 MG/ML IJ SOLN
10.0000 mg | Freq: Once | INTRAMUSCULAR | Status: AC
Start: 2013-02-06 — End: 2013-02-06
  Administered 2013-02-06: 10 mg via INTRAMUSCULAR
  Filled 2013-02-06 (×2): qty 2

## 2013-02-06 MED ORDER — AMLODIPINE BESYLATE 10 MG PO TABS
10.0000 mg | ORAL_TABLET | Freq: Every day | ORAL | Status: DC
  Administered 2013-02-07 – 2013-02-09 (×3): 10 mg via ORAL
  Filled 2013-02-06 (×5): qty 1

## 2013-02-06 MED ORDER — INSULIN ASPART 100 UNIT/ML ~~LOC~~ SOLN
0.0000 [IU] | SUBCUTANEOUS | Status: DC
  Administered 2013-02-06: 7 [IU] via SUBCUTANEOUS
  Administered 2013-02-06: 3 [IU] via SUBCUTANEOUS
  Administered 2013-02-06: 2 [IU] via SUBCUTANEOUS
  Administered 2013-02-07: 1 [IU] via SUBCUTANEOUS
  Administered 2013-02-07: 2 [IU] via SUBCUTANEOUS
  Administered 2013-02-07 – 2013-02-08 (×3): 1 [IU] via SUBCUTANEOUS
  Administered 2013-02-09 (×2): 3 [IU] via SUBCUTANEOUS
  Administered 2013-02-09: 2 [IU] via SUBCUTANEOUS
  Administered 2013-02-09: 1 [IU] via SUBCUTANEOUS
  Administered 2013-02-09: 3 [IU] via SUBCUTANEOUS
  Administered 2013-02-09: 2 [IU] via SUBCUTANEOUS
  Administered 2013-02-10: 7 [IU] via SUBCUTANEOUS
  Administered 2013-02-10 (×2): 2 [IU] via SUBCUTANEOUS

## 2013-02-06 MED ORDER — METOCLOPRAMIDE HCL 5 MG/ML IJ SOLN
10.0000 mg | Freq: Once | INTRAMUSCULAR | Status: AC
  Administered 2013-02-06: 10 mg via INTRAVENOUS
  Filled 2013-02-06: qty 2

## 2013-02-06 MED ORDER — METOPROLOL TARTRATE 1 MG/ML IV SOLN
5.0000 mg | Freq: Once | INTRAVENOUS | Status: AC
  Administered 2013-02-06: 5 mg via INTRAVENOUS
  Filled 2013-02-06: qty 5

## 2013-02-06 MED ORDER — PANTOPRAZOLE SODIUM 40 MG IV SOLR
40.0000 mg | Freq: Two times a day (BID) | INTRAVENOUS | Status: DC
  Administered 2013-02-06 – 2013-02-09 (×7): 40 mg via INTRAVENOUS
  Filled 2013-02-06 (×8): qty 40

## 2013-02-06 MED ORDER — INSULIN ASPART 100 UNIT/ML ~~LOC~~ SOLN: 0.0000 [IU] | Freq: Three times a day (TID) | SUBCUTANEOUS | Status: DC

## 2013-02-06 MED ORDER — POTASSIUM CHLORIDE CRYS ER 20 MEQ PO TBCR
40.0000 meq | EXTENDED_RELEASE_TABLET | Freq: Once | ORAL | Status: DC
  Filled 2013-02-06: qty 2

## 2013-02-06 MED ORDER — MORPHINE SULFATE 2 MG/ML IJ SOLN
2.0000 mg | INTRAMUSCULAR | Status: DC | PRN
  Administered 2013-02-06 – 2013-02-09 (×23): 2 mg via INTRAVENOUS
  Filled 2013-02-06 (×23): qty 1

## 2013-02-06 MED ORDER — HYDROMORPHONE HCL PF 1 MG/ML IJ SOLN
1.0000 mg | Freq: Once | INTRAMUSCULAR | Status: AC
  Administered 2013-02-06: 1 mg via INTRAMUSCULAR
  Filled 2013-02-06: qty 1

## 2013-02-06 MED ORDER — OXYCODONE HCL 5 MG PO TABS
5.0000 mg | ORAL_TABLET | Freq: Four times a day (QID) | ORAL | Status: DC | PRN
  Administered 2013-02-08 – 2013-02-10 (×6): 5 mg via ORAL
  Filled 2013-02-06 (×6): qty 1

## 2013-02-06 MED ORDER — SODIUM CHLORIDE 0.9 % IV BOLUS (SEPSIS)
1000.0000 mL | Freq: Once | INTRAVENOUS | Status: AC
  Administered 2013-02-06: 1000 mL via INTRAVENOUS

## 2013-02-06 MED ORDER — INSULIN ASPART 100 UNIT/ML ~~LOC~~ SOLN: 0.0000 [IU] | Freq: Every day | SUBCUTANEOUS | Status: DC

## 2013-02-06 MED ORDER — INSULIN GLARGINE 100 UNIT/ML ~~LOC~~ SOLN
10.0000 [IU] | Freq: Every day | SUBCUTANEOUS | Status: DC
  Administered 2013-02-06: 10 [IU] via SUBCUTANEOUS
  Filled 2013-02-06 (×2): qty 0.1

## 2013-02-06 MED ORDER — POTASSIUM CHLORIDE IN NACL 20-0.9 MEQ/L-% IV SOLN
INTRAVENOUS | Status: DC
  Administered 2013-02-06 – 2013-02-09 (×11): via INTRAVENOUS
  Administered 2013-02-09 – 2013-02-10 (×2): 125 mL/h via INTRAVENOUS
  Filled 2013-02-06 (×16): qty 1000

## 2013-02-06 MED ORDER — SODIUM CHLORIDE 0.9 % IV BOLUS (SEPSIS): 1000.0000 mL | Freq: Once | INTRAVENOUS | Status: DC

## 2013-02-06 MED ORDER — ADULT MULTIVITAMIN W/MINERALS CH
1.0000 | ORAL_TABLET | Freq: Every day | ORAL | Status: DC
  Administered 2013-02-07 – 2013-02-08 (×2): 1 via ORAL
  Filled 2013-02-06 (×3): qty 1

## 2013-02-06 MED ORDER — PROMETHAZINE HCL 25 MG RE SUPP
25.0000 mg | Freq: Four times a day (QID) | RECTAL | Status: DC | PRN
Start: 2013-02-06 — End: 2013-02-10

## 2013-02-06 MED ORDER — HYDRALAZINE HCL 20 MG/ML IJ SOLN
10.0000 mg | Freq: Four times a day (QID) | INTRAMUSCULAR | Status: DC | PRN
  Administered 2013-02-06: 10 mg via INTRAVENOUS
  Filled 2013-02-06: qty 0.5
  Filled 2013-02-06: qty 1

## 2013-02-06 MED ORDER — GABAPENTIN 300 MG PO CAPS
600.0000 mg | ORAL_CAPSULE | Freq: Three times a day (TID) | ORAL | Status: DC
  Administered 2013-02-07 – 2013-02-08 (×4): 600 mg via ORAL
  Filled 2013-02-06 (×8): qty 2

## 2013-02-06 MED ORDER — SODIUM CHLORIDE 0.9 % IV BOLUS (SEPSIS)
500.0000 mL | Freq: Once | INTRAVENOUS | Status: AC
  Administered 2013-02-06: 500 mL via INTRAVENOUS

## 2013-02-06 MED ORDER — PROMETHAZINE HCL 25 MG PO TABS: 25.0000 mg | ORAL_TABLET | Freq: Four times a day (QID) | ORAL | Status: DC | PRN

## 2013-02-06 MED ORDER — DOCUSATE SODIUM 100 MG PO CAPS
100.0000 mg | ORAL_CAPSULE | Freq: Two times a day (BID) | ORAL | Status: DC
  Administered 2013-02-07: 100 mg via ORAL
  Filled 2013-02-06 (×6): qty 1

## 2013-02-06 MED ORDER — PROMETHAZINE HCL 25 MG/ML IJ SOLN
25.0000 mg | Freq: Four times a day (QID) | INTRAMUSCULAR | Status: DC | PRN
  Administered 2013-02-06 – 2013-02-10 (×15): 25 mg via INTRAVENOUS
  Filled 2013-02-06 (×16): qty 1

## 2013-02-06 MED ORDER — MORPHINE SULFATE 2 MG/ML IJ SOLN
2.0000 mg | INTRAMUSCULAR | Status: DC | PRN
  Administered 2013-02-06 (×2): 2 mg via INTRAVENOUS
  Filled 2013-02-06 (×2): qty 1

## 2013-02-06 MED ORDER — SODIUM CHLORIDE 0.9 % IJ SOLN
3.0000 mL | Freq: Two times a day (BID) | INTRAMUSCULAR | Status: DC
  Administered 2013-02-06 – 2013-02-07 (×3): 3 mL via INTRAVENOUS

## 2013-02-06 NOTE — Care Management Note (Addendum)
    Page 1 of 1   02/06/2013     12:54:57 PM   CARE MANAGEMENT NOTE 02/06/2013  Patient:  [name], [name]   Account Number:  [id number]  Date Initiated:  02/06/2013  Documentation initiated by:  Dessa Phi  Subjective/Objective Assessment:   ADMITTED W/ABD PAIN.GASTROPARESIS.HX:DM.LAST ADMISSION WAS 12/28/12.     Action/Plan:   FROM HOME.HAS PCP,PHARMACY,PEG-INDEP W/CARE.   Anticipated DC Date:  02/11/2013   Anticipated DC Plan:  [place]  CM consult      Choice offered to / List presented to:             Status of service:  In process, will continue to follow Medicare Important Message given?   (If response is "NO", the following Medicare IM given date fields will be blank) Date Medicare IM given:   Date Additional Medicare IM given:    Discharge Disposition:    Per UR Regulation:  Reviewed for med. necessity/level of care/duration of stay  If discussed at [place] of Stay Meetings, dates discussed:    Comments:  02/06/13 [facility] RN,BSN NCM [id number]

## 2013-02-06 NOTE — Progress Notes (Signed)
Inpatient Diabetes Program Recommendations  AACE/ADA: New Consensus Statement on Inpatient Glycemic Control (2013)  Target Ranges:  Prepandial:   less than 140 mg/dL      Peak postprandial:   less than 180 mg/dL (1-2 hours)      Critically ill patients:  140 - 180 mg/dL   Reason for Visit: Results for AHNESTI, SABATELLI (MRN [id number]) as of 02/06/2013 09:58  Ref. Range 02/06/2013 01:57 02/06/2013 05:20  Glucose-Capillary Latest Range: 70-99 mg/dL 417 (H) 370 (H)   Please restart Lantus 10 units daily.  Also consider sensitive correction q 4 hours while NPO.      A1C pending although A1C in June was 8.5%.  Will follow.

## 2013-02-06 NOTE — ED Notes (Signed)
Patient to xray.

## 2013-02-06 NOTE — Progress Notes (Signed)
Patient evaluated for community based chronic disease management services with [place] [facility] as a benefit of patient's Medicare Insurance and COPD Gold Initiative. Spoke with patient at bedside to explain [place] [address] Management services.  Patient indicated that she does not require Care Management services at home.  She frequently fell asleep during the consult but did answer all questions.  Left contact information and [facility] literature at bedside. Made inpatient Case Manager aware that [place] Management consult. Of note, [facility] Care Management services does not replace or interfere with any services that are arranged by inpatient case management or social work.  For additional questions or referrals please contact Corliss Blacker BSN RN [facility] Liaison at [phone].

## 2013-02-06 NOTE — ED Notes (Signed)
POCT Hemmocult test NEGATIVE

## 2013-02-06 NOTE — ED Notes (Signed)
Patient states that she is unable to swallow potassium now.  Will try later

## 2013-02-06 NOTE — ED Notes (Signed)
EMS called to home.  Found patient sitting with complaints of vomiting since 2pm. Now she has complaints of hematemesis.  She has a history of Gastroparesis.

## 2013-02-06 NOTE — Progress Notes (Addendum)
Patient had CT of abdomen which showed coiled jejunal feeding tube in stomach. I have consulted Addendum IR for further evaluation and recommendations.  Patient has been seen by Teena Irani in the past.   Velvet Bathe

## 2013-02-06 NOTE — Progress Notes (Signed)
Patient declined activation of Mychart at this time

## 2013-02-06 NOTE — Progress Notes (Signed)
INITIAL NUTRITION ASSESSMENT  DOCUMENTATION CODES Per approved criteria  -Not Applicable   INTERVENTION: - Antiemetics per MD - Diet advancement per MD - Recommend initiation of TF of Osmolite 1.2 via J tube start at 69ml/hr increase by 46ml every 4 hours to goal of 73ml/hr. This will provide 1440 calories, 67g protein, 98ml free water meeting 99% estimated calorie needs, 112% estimated protein needs - Will continue to monitor   NUTRITION DIAGNOSIS: Inadequate oral intake related to inability to eat as evidenced by NPO.   Goal: 1. No further nausea/vomiting 2. Resume home TF regimen 3. Advance diet as tolerated to diabetic diet  Monitor:  Weights, labs, diet advancement, nausea/vomiting, TF initiation/advancement/tolerance  Reason for Assessment: Nutrition risk, home TF  42 y.o. female  Admitting Dx: Nausea, vomiting, and abdominal discomfort  ASSESSMENT: Pt discussed during multidisciplinary rounds.   Pt known to RD from previous admissions. Pt with history of diabetes, HTN, GERD, and diabetic gastroparesis. Pt admitted with 3 days of nausea/vomiting that became bloody yesterday afternoon. Pt has jejunostomy tube which she uses at night, told RN she gives herself 3 cans of Isosource TF. This provides 900 calories, 40g protein, 675ml free water (meets 62% estimated calorie needs, 67% estimated protein needs). Per nutrition screening on admission, home diet is listed as Osmolite 1.2 at 24ml/hr which provides 1152 calories, 53g protein, and 737ml free water (meets 79% estimated calorie needs, 88% estimated protein needs). Unsure how much pt eats by mouth during the day, pt kept falling asleep during RD visit. Past weight trend shows weight down 11 pounds in the past 2 months. Per conversation with pt during admission in May, she typically eats 1 meal/day.   Pt with low sodium and potassium, elevated Alk phos, and low magnesium.   Height: Ht Readings from Last 1 Encounters:   02/06/13 5\' 5"  (1.651 m)    Weight: Wt Readings from Last 1 Encounters:  02/06/13 141 lb 1.5 oz (64 kg)    Ideal Body Weight: 125 lb  % Ideal Body Weight: 113%  Wt Readings from Last 10 Encounters:  02/06/13 141 lb 1.5 oz (64 kg)  01/19/13 145 lb 4.5 oz (65.9 kg)  12/26/12 152 lb (68.947 kg)  12/26/12 152 lb (68.947 kg)  03/14/12 128 lb 1.4 oz (58.1 kg)  03/14/12 128 lb 1.4 oz (58.1 kg)  03/14/12 128 lb 1.4 oz (58.1 kg)  03/14/12 128 lb 1.4 oz (58.1 kg)  03/14/12 128 lb 1.4 oz (58.1 kg)  03/14/12 128 lb 1.4 oz (58.1 kg)    Usual Body Weight: 152 lb May 2014  % Usual Body Weight: 93%  BMI:  Body mass index is 23.48 kg/(m^2).  Estimated Nutritional Needs: Kcal: 1450-1650 Protein: 60-70g Fluid: 1.4-1.6L/day  Skin: Intact  Diet Order: NPO  EDUCATION NEEDS: -No education needs identified at this time  No intake or output data in the 24 hours ending 02/06/13 1016  Last BM: PTA  Labs:   Recent Labs Lab 02/06/13 0210  NA 130*  K 3.0*  CL 86*  CO2 25  BUN 13  CREATININE 0.58  CALCIUM 10.7*  MG 1.3*  PHOS 4.5  GLUCOSE 402*    CBG (last 3)   Recent Labs  02/06/13 0157 02/06/13 0520  GLUCAP 417* 370*    Scheduled Meds: . amLODipine  10 mg Oral Daily  . docusate sodium  100 mg Oral BID  . gabapentin  600 mg Oral TID  . insulin aspart  0-5 Units Subcutaneous QHS  .  insulin aspart  0-9 Units Subcutaneous Q4H  . insulin glargine  10 Units Subcutaneous Daily  . metoCLOPramide (REGLAN) injection  10 mg Intravenous Q6H  . metoprolol succinate  50 mg Oral Daily  . mometasone-formoterol  2 puff Inhalation BID  . multivitamin with minerals  1 tablet Oral Daily  . pantoprazole (PROTONIX) IV  40 mg Intravenous Q12H  . potassium chloride  40 mEq Oral Once  . sodium chloride  3 mL Intravenous Q12H    Continuous Infusions: . 0.9 % NaCl with KCl 20 mEq / L      Past Medical History  Diagnosis Date  . Hypertension   . Diabetes mellitus   .  Gastroparesis   . Asthma   . GERD (gastroesophageal reflux disease)   . Coronary artery disease   . h/o seizure   . Neuropathy   . Chronic pancreatitis   . Dyslipidemia   . MI (myocardial infarction)   . COPD (chronic obstructive pulmonary disease)   . Seizures     Past Surgical History  Procedure Laterality Date  . Abdominal hysterectomy    . Cholecystectomy    . Peg tube x 4      feeding jejunostomies with PEG tubes  . Cesarean section    . Colon resection due to diverticulitis    . Back surgery    . Esophagogastroduodenoscopy N/A 12/27/2012    Procedure: ESOPHAGOGASTRODUODENOSCOPY (EGD);  Surgeon: Missy Sabins, MD;  Location: [facility];  Service: Endoscopy;  Laterality: N/A;     Mikey College MS, [place], [place] Pager [phone] After Hours Pager

## 2013-02-06 NOTE — ED Provider Notes (Signed)
Medical screening examination/treatment/procedure(s) were performed by non-physician practitioner and as supervising physician I was immediately available for consultation/collaboration.   Blanchie Dessert, MD 02/06/13 [phone]

## 2013-02-06 NOTE — ED Provider Notes (Signed)
History    CSN: [id number] Arrival date & time 02/06/13  0112  First MD Initiated Contact with Patient 02/06/13 0118     Chief Complaint  Patient presents with  . Nausea  . Emesis   (Consider location/radiation/quality/duration/timing/severity/associated sxs/prior Treatment) HPI Comments: Patient is a 42 y/o female with a hx of gastroparesis, chronic pancreatitis, GERD, and DM, who presents for abdominal pain with onset yesterday. Patient states that pain is constant and generalized in her abdomen without radiation or modifying factors. She admits to "a lot" of episodes of emesis, beginning yesterday, but becoming bloody this afternoon. Patient unable to specify how many episodes of hematemesis she had today; emesis on arrival was clear, nonbloody, and nonbilious. She states that this pain feels similar to the pain she usually has with her gastroparesis. She endorses a bowel movement yesterday of watery, nonbloody diarrhea. She denies associated fever, chest pain, SOB, melena, hematochezia, urinary symptoms, and numbness/tingling. Patient states she takes Lantus QHS for diabetes and admits to medication compliance yesterday. Medication list specifies Humalog on sliding scale; however patient states that she "only takes Lantus". CBG on arrival 417.  The history is provided by the patient. No language interpreter was used.   Past Medical History  Diagnosis Date  . Hypertension   . Diabetes mellitus   . Gastroparesis   . Asthma   . GERD (gastroesophageal reflux disease)   . Coronary artery disease   . h/o seizure   . Neuropathy   . Chronic pancreatitis   . Dyslipidemia   . MI (myocardial infarction)   . COPD (chronic obstructive pulmonary disease)   . Seizures    Past Surgical History  Procedure Laterality Date  . Abdominal hysterectomy    . Cholecystectomy    . Peg tube x 4      feeding jejunostomies with PEG tubes  . Cesarean section    . Colon resection due to diverticulitis     . Back surgery    . Esophagogastroduodenoscopy N/A 12/27/2012    Procedure: ESOPHAGOGASTRODUODENOSCOPY (EGD);  Surgeon: Missy Sabins, MD;  Location: [facility];  Service: Endoscopy;  Laterality: N/A;   Family History  Problem Relation Age of Onset  . Cancer Mother    History  Substance Use Topics  . Smoking status: Current Every Day Smoker -- 0.25 packs/day for 2 years    Types: Cigarettes  . Smokeless tobacco: Never Used  . Alcohol Use: No     Comment: hx 1 beer daily -pt states she quit Dec. 2012   OB History   Grav Para Term Preterm Abortions TAB SAB Ect Mult Living                 Review of Systems  Constitutional: Negative for fever.  Respiratory: Negative for shortness of breath.   Cardiovascular: Negative for chest pain.  Gastrointestinal: Positive for nausea, vomiting, abdominal pain and diarrhea. Negative for blood in stool.  Genitourinary: Negative for dysuria.  Neurological: Negative for weakness and numbness.  All other systems reviewed and are negative.    Allergies  Penicillins; Cefadroxil; Compazine; Darvocet; Nsaids; Prochlorperazine; Zofran; and Zofran  Home Medications   Current Outpatient Rx  Name  Route  Sig  Dispense  Refill  . amLODipine (NORVASC) 10 MG tablet   Oral   Take 1 tablet (10 mg total) by mouth daily.   30 tablet   2   . atorvastatin (LIPITOR) 40 MG tablet   Oral   Take 1 tablet (40  mg total) by mouth daily at 6 PM.   30 tablet   0   . docusate sodium (COLACE) 100 MG capsule   Oral   Take 100 mg by mouth 2 (two) times daily.         Marland Kitchen esomeprazole (NEXIUM) 40 MG capsule   Oral   Take 1 capsule (40 mg total) by mouth 2 (two) times daily.   30 capsule   0   . Fluticasone-Salmeterol (ADVAIR) 250-50 MCG/DOSE AEPB   Inhalation   Inhale 1 puff into the lungs 2 (two) times daily.   60 each   0   . gabapentin (NEURONTIN) 300 MG capsule   Oral   Take 2 capsules (600 mg total) by mouth 3 (three) times daily.   30  capsule   0   . insulin glargine (LANTUS) 100 UNIT/ML injection   Subcutaneous   Inject 0.1 mLs (10 Units total) into the skin at bedtime.   10 mL   12   . insulin lispro (HUMALOG) 100 UNIT/ML injection   Subcutaneous   Inject 10-20 Units into the skin 3 (three) times daily before meals. Sliding scale   10 mL   2   . metoCLOPramide (REGLAN) 10 MG tablet   Oral   Take 10 mg by mouth 4 (four) times daily.         . metoprolol succinate (TOPROL XL) 50 MG 24 hr tablet   Oral   Take 1 tablet (50 mg total) by mouth daily. Take with or immediately following a meal.   30 tablet   0   . Multiple Vitamin (MULITIVITAMIN WITH MINERALS) TABS   Oral   Take 1 tablet by mouth daily.   30 tablet   0   . Nutritional Supplements (FEEDING SUPPLEMENT, OSMOLITE 1.2 CAL,) LIQD   Per Tube   Place 1,000 mLs into feeding tube continuous. Rate:66ml/hr         . oxyCODONE (OXY IR/ROXICODONE) 5 MG immediate release tablet   Oral   Take 1-2 tablets (5-10 mg total) by mouth every 6 (six) hours as needed.   30 tablet   0   . promethazine (PHENERGAN) 25 MG tablet   Oral   Take 1 tablet (25 mg total) by mouth every 6 (six) hours as needed for nausea.   30 tablet   0   . zolpidem (AMBIEN) 10 MG tablet   Oral   Take 10 mg by mouth at bedtime as needed for sleep.          BP 197/120  Pulse 115  Temp(Src) 98.6 F (37 C) (Oral)  Resp 19  SpO2 98% Physical Exam  Nursing note and vitals reviewed. Constitutional: She is oriented to person, place, and time. She appears well-developed and well-nourished. No distress.  HENT:  Head: Normocephalic and atraumatic.  Mouth/Throat: Oropharynx is clear and moist. No oropharyngeal exudate.  Eyes: Conjunctivae and EOM are normal. Pupils are equal, round, and reactive to light.  Neck: Normal range of motion.  Cardiovascular: Normal rate, regular rhythm, normal heart sounds and intact distal pulses.   Pulmonary/Chest: Effort normal and breath sounds  normal. No respiratory distress. She has no wheezes. She has no rales.  Abdominal: Soft. She exhibits no distension and no mass. There is tenderness (diffuse, nonfocal). There is no rebound and no guarding.  Genitourinary:  Chaperoned rectal exam with brown stool; no gross blood and rectal tone normal.  Musculoskeletal: Normal range of motion.  Neurological: She is  alert and oriented to person, place, and time.  Skin: Skin is warm and dry. No rash noted. She is not diaphoretic. No erythema.  Psychiatric: She has a normal mood and affect. Her behavior is normal.    ED Course  Procedures (including critical care time) Labs Reviewed  COMPREHENSIVE METABOLIC PANEL - Abnormal; Notable for the following:    Sodium 130 (*)    Potassium 3.0 (*)    Chloride 86 (*)    Glucose, Bld 402 (*)    Calcium 10.7 (*)    Total Protein 8.8 (*)    Albumin 3.3 (*)    Alkaline Phosphatase 149 (*)    All other components within normal limits  GLUCOSE, CAPILLARY - Abnormal; Notable for the following:    Glucose-Capillary 417 (*)    All other components within normal limits  CBC WITH DIFFERENTIAL - Abnormal; Notable for the following:    WBC 14.1 (*)    Neutrophils Relative % 87 (*)    Neutro Abs 12.2 (*)    Lymphocytes Relative 10 (*)    All other components within normal limits  BLOOD GAS, VENOUS - Abnormal; Notable for the following:    pH, Ven 7.513 (*)    pCO2, Ven 34.3 (*)    Bicarbonate 27.4 (*)    Acid-Base Excess 4.8 (*)    All other components within normal limits  GLUCOSE, CAPILLARY - Abnormal; Notable for the following:    Glucose-Capillary 370 (*)    All other components within normal limits  LIPASE, BLOOD  CBC WITH DIFFERENTIAL  URINALYSIS, ROUTINE W REFLEX MICROSCOPIC  OCCULT BLOOD, POC DEVICE   Dg Abd Acute W/chest  02/06/2013   *RADIOLOGY REPORT*  Clinical Data: Mid abdominal pain and vomiting for 24 hours.  ACUTE ABDOMEN SERIES (ABDOMEN 2 VIEW & CHEST 1 VIEW)  Comparison: Chest  01/19/2013.  Abdominal series 01/19/2013.  Findings: Slightly shallow inspiration. The heart size and pulmonary vascularity are normal. The lungs appear clear and expanded without focal air space disease or consolidation. No blunting of the costophrenic angles.  Central perihilar interstitial changes suggest chronic bronchitis.  No pneumothorax. Mediastinal contours appear intact.  Scattered gas and stool in the colon.  No small or large bowel distension.  Postoperative changes in the left abdomen consistent with bowel staples. Surgical clips in the right upper quadrant.  No free air in the abdomen.  No abnormal air fluid levels.  Calcified phleboliths in the pelvis.  No radiopaque stones.  Postoperative changes in the lumbar spine.  IMPRESSION: No evidence of active pulmonary disease.  Probable chronic bronchitic changes.  Nonobstructive bowel gas pattern.   Original Report Authenticated By: Lucienne Capers, M.D.   1. Abdominal pain   2. Hyperglycemia    MDM  Patient with hx of gastroparesis and DM presents for abdominal pain with onset yesterday. Physical exam as above. Patient hyperglycemic on arrival. Despite anion gap of 19, patient does not appear to be in DKA given pH on VBG of 7.51. Hypokalemia addressed and K-dur ordered. DG acute abdomen without evidence of obstructive bowel gas pattern. Will continue with IVF and symptomatic management. Anticipate d/c home after symptoms more adequately managed and if PO challenge successful. Low suspicion for upper GI bleed as cause of hematemesis given nonbloody, nonbilious emesis in ED, hemodynamic stability, and negative hemoccult.  Patient now with blood streaked emesis in ED. Will consult for admission for observation given signs of UGI bleed. Lopressor 5mg  ordered for hypertension and tachycardia; patient's diastolic baseline  appears to be around 90-110.  Hospitalist to admit. Temp admit orders placed.  Antonietta Breach, [place] 02/06/13 [phone]

## 2013-02-06 NOTE — H&P (Addendum)
[facility] History and Physical  [name] [id number] DOB: 16-Jan-1971 DOA: 02/06/2013  Referring physician: Dr. Maryan Rued PCP: [name], MD  Specialists: None  Chief Complaint: Nausea, vomiting, and abdominal discomfort  HPI: [name] is a 42 y.o. female  With history of diabetes, HTN, GERD, and diabetic gastroparesis. Has presented and been admitted to the hospital for recurrent nausea and vomiting due to her diabetic gastroparesis with her last admission on 01/19/13 for the complaints listed above which was felt to be 2ary to gastroparesis and she was treated with Reglan 10 mg IV q 6 hours and pain control with oxycodone.   She reports that for the last 3 days she has had nausea and emesis.  Has had more than 10 bouts of emesis reportedly since onset of symptoms 3 days ago.  History is limited as patient is uncomfortable and with nausea.  Also endorses diffuse abdominal discomfort made worse by consumption of food.  This morning she states she had a bout of emesis and noticed some blood tinged emesis  Given her nausea and emesis we were consulted for further admission evaluation and recommendations.  Review of Systems: 10 point review of system reviewed and negative unless otherwise mentioned above.  Past Medical History  Diagnosis Date  . Hypertension   . Diabetes mellitus   . Gastroparesis   . Asthma   . GERD (gastroesophageal reflux disease)   . Coronary artery disease   . h/o seizure   . Neuropathy   . Chronic pancreatitis   . Dyslipidemia   . MI (myocardial infarction)   . COPD (chronic obstructive pulmonary disease)   . Seizures    Past Surgical History  Procedure Laterality Date  . Abdominal hysterectomy    . Cholecystectomy    . Peg tube x 4      feeding jejunostomies with PEG tubes  . Cesarean section    . Colon resection due to diverticulitis    . Back surgery    . Esophagogastroduodenoscopy N/A 12/27/2012    Procedure:  ESOPHAGOGASTRODUODENOSCOPY (EGD);  Surgeon: Missy Sabins, MD;  Location: [facility];  Service: Endoscopy;  Laterality: N/A;   Social History:  reports that she has been smoking Cigarettes.  She has a .5 pack-year smoking history. She has never used smokeless tobacco. She reports that she does not drink alcohol or use illicit drugs. Lives at home Yes to ADLS at home  Allergies  Allergen Reactions  . Penicillins Hives  . Cefadroxil     Not sure of effects-listed as allergy on list from home  . Compazine Hives  . Darvocet (Propoxyphene-Acetaminophen) Other (See Comments)    UNKNOWN  . Nsaids Hives  . Prochlorperazine     Not sure of effects-listed as allergy on list from home  . Zofran Hives  . Zofran (Ondansetron) Hives    Family History  Problem Relation Age of Onset  . Cancer Mother   None other reported.   Prior to Admission medications   Medication Sig Start Date End Date Taking? Authorizing Provider  amLODipine (NORVASC) 10 MG tablet Take 1 tablet (10 mg total) by mouth daily. 01/22/13  Yes Oswald Hillock, MD  atorvastatin (LIPITOR) 40 MG tablet Take 1 tablet (40 mg total) by mouth daily at 6 PM. 12/29/12  Yes Belkys A Regalado, MD  docusate sodium (COLACE) 100 MG capsule Take 100 mg by mouth 2 (two) times daily.   Yes Historical Provider, MD  esomeprazole (NEXIUM) 40 MG capsule Take 1  capsule (40 mg total) by mouth 2 (two) times daily. 12/29/12  Yes Belkys A Regalado, MD  Fluticasone-Salmeterol (ADVAIR) 250-50 MCG/DOSE AEPB Inhale 1 puff into the lungs 2 (two) times daily. 12/02/11  Yes Robbie Lis, MD  gabapentin (NEURONTIN) 300 MG capsule Take 2 capsules (600 mg total) by mouth 3 (three) times daily. 12/29/12  Yes Belkys A Regalado, MD  insulin glargine (LANTUS) 100 UNIT/ML injection Inject 0.1 mLs (10 Units total) into the skin at bedtime. 12/29/12  Yes Belkys A Regalado, MD  insulin lispro (HUMALOG) 100 UNIT/ML injection Inject 10-20 Units into the skin 3 (three) times daily  before meals. Sliding scale 12/02/11  Yes Robbie Lis, MD  metoCLOPramide (REGLAN) 10 MG tablet Take 10 mg by mouth 4 (four) times daily.   Yes Historical Provider, MD  metoprolol succinate (TOPROL XL) 50 MG 24 hr tablet Take 1 tablet (50 mg total) by mouth daily. Take with or immediately following a meal. 12/29/12  Yes Belkys A Regalado, MD  Multiple Vitamin (MULITIVITAMIN WITH MINERALS) TABS Take 1 tablet by mouth daily. 12/02/11  Yes Robbie Lis, MD  Nutritional Supplements (FEEDING SUPPLEMENT, OSMOLITE 1.2 CAL,) LIQD Place 1,000 mLs into feeding tube continuous. Rate:70ml/hr   Yes Historical Provider, MD  oxyCODONE (OXY IR/ROXICODONE) 5 MG immediate release tablet Take 1-2 tablets (5-10 mg total) by mouth every 6 (six) hours as needed. 01/22/13  Yes Oswald Hillock, MD  promethazine (PHENERGAN) 25 MG tablet Take 1 tablet (25 mg total) by mouth every 6 (six) hours as needed for nausea. [id number]  Yes Delora Fuel, MD  zolpidem (AMBIEN) 10 MG tablet Take 10 mg by mouth at bedtime as needed for sleep.   Yes Historical Provider, MD   Physical Exam: Filed Vitals:   02/06/13 0331 02/06/13 0340 02/06/13 0744 02/06/13 0824  BP: 192/124 197/120 177/94 176/112  Pulse: 107 115 103 98  Temp: 98.6 F (37 C)   98.7 F (37.1 C)  TempSrc: Oral   Oral  Resp: 19  18 16   Height:    5\' 5"  (1.651 m)  Weight:    64 kg (141 lb 1.5 oz)  SpO2: 97% 98% 99% 100%     General:  Pt uncomfortable but non toxic appearing, in NAD, Alert and Awake  Eyes: EOMI, non icteric  ENT: normal exterior appearance with dry mucous membranes  Neck: supple, no goiter  Cardiovascular: RRR, no murmurs or rubs  Respiratory: CTA BL, no wheezes  Abdomen: soft, + bowel sounds, diffuse tenderness with palpation  Skin: warm and dry  Musculoskeletal: no cyanosis or clubbing  Psychiatric: mood and affect appropriate  Neurologic: answers questions appropriately, no facial asymmetry, moves all extremities equally  Labs on  Admission:  Basic Metabolic Panel:  Recent Labs Lab 02/06/13 0210  NA 130*  K 3.0*  CL 86*  CO2 25  GLUCOSE 402*  BUN 13  CREATININE 0.58  CALCIUM 10.7*   Liver Function Tests:  Recent Labs Lab 02/06/13 0210  AST 15  ALT 11  ALKPHOS 149*  BILITOT 0.3  PROT 8.8*  ALBUMIN 3.3*    Recent Labs Lab 02/06/13 0210  LIPASE 23   No results found for this basename: AMMONIA,  in the last 168 hours CBC:  Recent Labs Lab 02/06/13 0320  WBC 14.1*  NEUTROABS 12.2*  HGB 13.0  HCT 37.0  MCV 85.5  PLT 219   Cardiac Enzymes: No results found for this basename: CKTOTAL, CKMB, CKMBINDEX, TROPONINI,  in the last  168 hours  BNP (last 3 results) No results found for this basename: PROBNP,  in the last 8760 hours CBG:  Recent Labs Lab 02/06/13 0157 02/06/13 0520  GLUCAP 417* 370*    Radiological Exams on Admission: Dg Abd Acute W/chest  02/06/2013   *RADIOLOGY REPORT*  Clinical Data: Mid abdominal pain and vomiting for 24 hours.  ACUTE ABDOMEN SERIES (ABDOMEN 2 VIEW & CHEST 1 VIEW)  Comparison: Chest 01/19/2013.  Abdominal series 01/19/2013.  Findings: Slightly shallow inspiration. The heart size and pulmonary vascularity are normal. The lungs appear clear and expanded without focal air space disease or consolidation. No blunting of the costophrenic angles.  Central perihilar interstitial changes suggest chronic bronchitis.  No pneumothorax. Mediastinal contours appear intact.  Scattered gas and stool in the colon.  No small or large bowel distension.  Postoperative changes in the left abdomen consistent with bowel staples. Surgical clips in the right upper quadrant.  No free air in the abdomen.  No abnormal air fluid levels.  Calcified phleboliths in the pelvis.  No radiopaque stones.  Postoperative changes in the lumbar spine.  IMPRESSION: No evidence of active pulmonary disease.  Probable chronic bronchitic changes.  Nonobstructive bowel gas pattern.   Original Report  Authenticated By: Lucienne Capers, M.D.    Assessment/Plan Active Problems:  1. Hematemesis - Liver enzymes WNL, most likely due to recurrent bouts of emesis (suspect Tillman Abide). Patient is not actively bleeding currently. - will provide antiemetics and monitor cbc  2. Nausea and vomiting/gastroparesis - most likely due to gastroparesis given history - alk phos is currently elevated and blood pressure elevated.  Will obtain RUQ ultrasound to rule out other causes of abdominal discomfort (like cholecystitis, choledocholithiasis, or liver capsule enlargement) - Will draw phosphorus levels - NPO initially, Addendum when able to advance diet will advance tube feeds and plan on consulting Dietitian.  3. Hypokalemia - has been replaced orally and most likely is due to poor oral intake given # 2 - Will also add to IVF - Will also check magnesium levels.  4. DM - At this point will place just on SSI - once patient able to tolerate po will advance to diet accordingly  5. HTN - Continue home regimen with PRN IV hydralazine for SBP > 180 or DBP > 110 - most likely exacerbated due to abdominal discomfort and nausea/emesis  6. GERD - place on protonix 40 mg IV BID given # 1  7. DVT prophylaxis - Avoid heparin given # 1 will place on SCDs   Code Status: full Family Communication: no family at bedside. Disposition Plan: Pending continued improvement in condition.  Time spent: > 65 minutes  Velvet Bathe [facility] Pager [phone]  If 7PM-7AM, please contact night-coverage [email or web address] Password [facility] 02/06/2013, 8:49 AM   Addendum - Patient does have leukocytosis but at this juncture no source of infection identified. Patient has been afebrile and does not meet sirs criteria.  Therefore will not initiate antibiotic therapy. - most likely stress reaction from nausea and emesis.

## 2013-02-07 ENCOUNTER — Inpatient Hospital Stay (HOSPITAL_COMMUNITY): Payer: Medicare Other

## 2013-02-07 ENCOUNTER — Other Ambulatory Visit: Payer: Self-pay

## 2013-02-07 DIAGNOSIS — E860 Dehydration: Secondary | ICD-10-CM

## 2013-02-07 LAB — GLUCOSE, CAPILLARY
Glucose-Capillary: 148 mg/dL — ABNORMAL HIGH (ref 70–99)
Glucose-Capillary: 134 mg/dL — ABNORMAL HIGH (ref 70–99)

## 2013-02-07 LAB — CBC
MCV: 86.6 fL (ref 78.0–100.0)
Platelets: 196 10*3/uL (ref 150–400)
RBC: 4.24 MIL/uL (ref 3.87–5.11)
WBC: 12 10*3/uL — ABNORMAL HIGH (ref 4.0–10.5)

## 2013-02-07 LAB — COMPREHENSIVE METABOLIC PANEL
ALT: 6 U/L (ref 0–35)
AST: 11 U/L (ref 0–37)
CO2: 26 mEq/L (ref 19–32)
Chloride: 100 mEq/L (ref 96–112)
Creatinine, Ser: 0.51 mg/dL (ref 0.50–1.10)
GFR calc non Af Amer: >90 mL/min (ref >90)
Total Bilirubin: 0.3 mg/dL (ref 0.3–1.2)

## 2013-02-07 MED ORDER — SODIUM CHLORIDE 0.9 % IV SOLN: INTRAVENOUS | Status: DC

## 2013-02-07 MED ORDER — IOHEXOL 300 MG/ML  SOLN
20.0000 mL | Freq: Once | INTRAMUSCULAR | Status: AC | PRN
  Administered 2013-02-07: 20 mL

## 2013-02-07 MED ORDER — NITROGLYCERIN 0.4 MG SL SUBL: 0.4000 mg | SUBLINGUAL_TABLET | SUBLINGUAL | Status: DC | PRN

## 2013-02-07 MED ORDER — POTASSIUM CHLORIDE 10 MEQ/100ML IV SOLN
10.0000 meq | INTRAVENOUS | Status: AC
  Administered 2013-02-07 (×4): 10 meq via INTRAVENOUS
  Filled 2013-02-07 (×4): qty 100

## 2013-02-07 MED ORDER — NITROGLYCERIN 0.4 MG SL SUBL
SUBLINGUAL_TABLET | SUBLINGUAL | Status: AC
  Administered 2013-02-07: 23:00:00
  Filled 2013-02-07: qty 25

## 2013-02-07 MED ORDER — MAGNESIUM SULFATE 40 MG/ML IJ SOLN
2.0000 g | Freq: Once | INTRAMUSCULAR | Status: AC
  Administered 2013-02-07: 2 g via INTRAVENOUS
  Filled 2013-02-07: qty 50

## 2013-02-07 NOTE — Procedures (Signed)
SUCCESSFUL REPOSITION OF 18 FR J TUBE INTO PROX JEJUNUM NO COMP STABLE READY FOR USE

## 2013-02-07 NOTE — Progress Notes (Signed)
[facility] PROGRESS NOTE  [name] [id number] DOB: September 24, 1970 DOA: 02/06/2013 PCP: [name], MD  Assessment/Plan: 1-Hematemesis most likely due to recurrent bouts of emesis (suspect Tillman Abide). Patient is not actively bleeding currently.  Hb stable.   2. Nausea and vomiting/gastroparesis  - most likely due to gastroparesis given history  - alk phos is currently elevated.  RUQ ultrasound prior cholecystectomy.   - NPO. Continue with Reglan and antiemetic.s -IR consulted to review peg tube.   3. Hypokalemia  - Replete wit 4 runs IV. -Replete Mg IV.   4. DM  -Continue with SSI.   5. HTN  - Continue home regimen with PRN IV hydralazine for SBP > 180 or DBP > 110   6. GERD  - continue with  protonix 40 mg IV BID   7. DVT prophylaxis  - Avoid heparin given # 1 will place on SCDs   Code Status: Full  Family Communication: Care discussed with patient.  Disposition Plan: Home when stable.    Consultants:  IR  Procedures:  Abdominal US:  1. Somewhat echogenic right renal parenchyma of questionable  significance. Correlate with renal laboratory values. No  hydronephrosis.  2. The liver is unremarkable.  3. Prior cholecystectomy   Antibiotics:  none  HPI/Subjective: Still with nausea , vomiting and abdominal pain. She relates water stool today and yesterday.   Objective: Filed Vitals:   02/06/13 1409 02/06/13 1948 02/07/13 0432 02/07/13 1023  BP: 161/89 163/98 163/102   Pulse: 81 132 115   Temp: 97.4 F (36.3 C) 99.3 F (37.4 C) 99 F (37.2 C)   TempSrc: Axillary Oral Oral   Resp: 16 20 20    Height:      Weight:      SpO2: 100% 99% 100% 95%    Intake/Output Summary (Last 24 hours) at 02/07/13 1048 Last data filed at 02/07/13 0600  Gross per 24 hour  Intake   2875 ml  Output      0 ml  Net   2875 ml   Filed Weights   02/06/13 0824  Weight: 64 kg (141 lb 1.5 oz)    Exam:   General:  No distress.   Cardiovascular: S 1, S  2 RRR  Respiratory: CTA  Abdomen: BS present, soft, generalized tenderness.   Musculoskeletal: no edema  Data Reviewed: Basic Metabolic Panel:  Recent Labs Lab 02/06/13 0210 02/07/13 0420  NA 130* 137  K 3.0* 3.0*  CL 86* 100  CO2 25 26  GLUCOSE 402* 139*  BUN 13 8  CREATININE 0.58 0.51  CALCIUM 10.7* 8.7  MG 1.3*  --   PHOS 4.5  --    Liver Function Tests:  Recent Labs Lab 02/06/13 0210 02/07/13 0420  AST 15 11  ALT 11 6  ALKPHOS 149* 106  BILITOT 0.3 0.3  PROT 8.8* 6.7  ALBUMIN 3.3* 2.3*    Recent Labs Lab 02/06/13 0210  LIPASE 23   No results found for this basename: AMMONIA,  in the last 168 hours CBC:  Recent Labs Lab 02/06/13 0320 02/07/13 0420  WBC 14.1* 12.0*  NEUTROABS 12.2*  --   HGB 13.0 12.4  HCT 37.0 36.7  MCV 85.5 86.6  PLT 219 196   Cardiac Enzymes: No results found for this basename: CKTOTAL, CKMB, CKMBINDEX, TROPONINI,  in the last 168 hours BNP (last 3 results) No results found for this basename: PROBNP,  in the last 8760 hours CBG:  Recent Labs Lab 02/06/13 1656 02/06/13  1939 02/07/13 0003 02/07/13 0353 02/07/13 0733  GLUCAP 227* 165* 160* 135* 148*    No results found for this or any previous visit (from the past 240 hour(s)).   Studies: Ct Abdomen Pelvis Wo Contrast  02/06/2013   *RADIOLOGY REPORT*  Clinical Data: Abdominal discomfort, gastroesophageal reflux, diabetic gastroparesis  CT ABDOMEN AND PELVIS WITHOUT CONTRAST  Technique:  Multidetector CT imaging of the abdomen and pelvis was performed following the standard protocol without intravenous contrast.  Comparison: 12/24/2012  Findings: Lung bases clear.  Normal heart size.  Circumferential wall thickening of the distal esophagus with an associated small hiatal hernia, this can be seen with chronic esophagitis/reflux. No pericardial or pleural effusion.  Abdomen:  The balloon retention gastrojejunostomy is coiled the stomach. Prior cholecystectomy noted.  Liver,  biliary system, pancreas, spleen, adrenal glands, and kidneys demonstrate no acute process by noncontrast imaging.  Negative for bowel obstruction, dilatation, ileus pattern, or free air.  No hydronephrosis or obstructing urinary tract calculus demonstrated.  No evidence of hemorrhage, adenopathy, fluid collection, or abscess.  Atherosclerosis of the aorta without aneurysm.  Postop changes of the bowel in the left upper quadrant noted.  Pelvis:  Moderate distention of the urinary bladder.  Prior hysterectomy evident.  No pelvic free fluid, fluid collection, hemorrhage, abscess, inguinal abnormality, or hernia.  Pelvic calcifications noted consistent with venous phleboliths.  Postop changes of the lumbar spine.  IMPRESSION: Jejunal feeding tube coiled in the stomach.  Thickened distal esophageal wall with an associated hiatal hernia, suspect chronic reflux/esophagitis.  Prior cholecystectomy and hysterectomy  No acute intra-abdominal or pelvic process by noncontrast imaging.  No obstructing urinary tract calculus  Moderate to severe bladder distention   Original Report Authenticated By: Jerilynn Mages. Annamaria Boots, M.D.   US Abdomen Complete  02/06/2013   *RADIOLOGY REPORT*  Clinical Data:  Elevated alkaline phosphatase level, hypertension, diabetes  COMPLETE ABDOMINAL ULTRASOUND  Comparison:  CT abdomen pelvis of 02/06/2013  Findings:  Gallbladder:  The gallbladder has previously been resected.  Common bile duct:  The common bile duct is normal measuring 4.6 mm in diameter.  Liver:  The liver is unremarkable with no focal abnormality noted. No ductal dilatation.  IVC:  Appears normal.  Pancreas:  The midportion of the pancreas appears normal with the head and tail obscured by bowel gas.  Spleen:  Spleen is normal measuring 5.3 cm sagittally.  Right Kidney:  No hydronephrosis is seen.  The right kidney measures 13.3 cm sagittally and does appear to be somewhat echogenic.  Chronic renal medical disease is a consideration.  Left  Kidney:  No hydronephrosis is seen.  The left kidney measures 14.2 cm sagittally and echogenicity of the renal parenchyma appears more normal.  Abdominal aorta:  The abdominal aorta is normal in caliber.  IMPRESSION:  1.  Somewhat echogenic right renal parenchyma of questionable significance.  Correlate with renal laboratory values.  No hydronephrosis. 2.  The liver is unremarkable. 3.  Prior cholecystectomy   Original Report Authenticated By: Ivar Drape, M.D.   Dg Abd Acute W/chest  02/06/2013   *RADIOLOGY REPORT*  Clinical Data: Mid abdominal pain and vomiting for 24 hours.  ACUTE ABDOMEN SERIES (ABDOMEN 2 VIEW & CHEST 1 VIEW)  Comparison: Chest 01/19/2013.  Abdominal series 01/19/2013.  Findings: Slightly shallow inspiration. The heart size and pulmonary vascularity are normal. The lungs appear clear and expanded without focal air space disease or consolidation. No blunting of the costophrenic angles.  Central perihilar interstitial changes suggest chronic bronchitis.  No pneumothorax. Mediastinal contours appear intact.  Scattered gas and stool in the colon.  No small or large bowel distension.  Postoperative changes in the left abdomen consistent with bowel staples. Surgical clips in the right upper quadrant.  No free air in the abdomen.  No abnormal air fluid levels.  Calcified phleboliths in the pelvis.  No radiopaque stones.  Postoperative changes in the lumbar spine.  IMPRESSION: No evidence of active pulmonary disease.  Probable chronic bronchitic changes.  Nonobstructive bowel gas pattern.   Original Report Authenticated By: Lucienne Capers, M.D.    Scheduled Meds: . amLODipine  10 mg Oral Daily  . docusate sodium  100 mg Oral BID  . gabapentin  600 mg Oral TID  . insulin aspart  0-5 Units Subcutaneous QHS  . insulin aspart  0-9 Units Subcutaneous Q4H  . metoCLOPramide (REGLAN) injection  10 mg Intravenous Q6H  . metoprolol succinate  50 mg Oral Daily  . mometasone-formoterol  2 puff  Inhalation BID  . multivitamin with minerals  1 tablet Oral Daily  . pantoprazole (PROTONIX) IV  40 mg Intravenous Q12H  . potassium chloride  10 mEq Intravenous Q1 Hr x 4  . potassium chloride  40 mEq Oral Once  . sodium chloride  3 mL Intravenous Q12H   Continuous Infusions: . 0.9 % NaCl with KCl 20 mEq / L 125 mL/hr at 02/07/13 [id number]    Active Problems:   Gastroparesis   Nausea and vomiting   Dyslipidemia   GERD (gastroesophageal reflux disease)   Hypertension   DM (diabetes mellitus), type 2, uncontrolled   Hypokalemia   Abdominal pain   Hematemesis    Time spent: 35 minutes    Yunus Stoklosa  [facility] Pager [phone]. If 7PM-7AM, please contact night-coverage at [email or web address], password [facility] 02/07/2013, 10:48 AM  LOS: 1 day

## 2013-02-07 NOTE — Progress Notes (Signed)
Inpatient Diabetes Program Recommendations  AACE/ADA: New Consensus Statement on Inpatient Glycemic Control (2013)  Target Ranges:  Prepandial:   less than 140 mg/dL      Peak postprandial:   less than 180 mg/dL (1-2 hours)      Critically ill patients:  140 - 180 mg/dL   Reason for Visit: Results for [name], [name] (MRN [id number]) as of 02/07/2013 11:51  Ref. Range 02/06/2013 16:56 02/06/2013 19:39 02/07/2013 00:03 02/07/2013 03:53 02/07/2013 07:33  Glucose-Capillary Latest Range: 70-99 mg/dL 227 (H) 165 (H) 160 (H) 135 (H) 148 (H)   CBG's improved with home dose of Lantus and Novolog correction q 4 hours.  Consider resuming Lantus 10 units daily to help cover basal insulin needs.

## 2013-02-08 ENCOUNTER — Inpatient Hospital Stay (HOSPITAL_COMMUNITY): Payer: Medicare Other

## 2013-02-08 LAB — BASIC METABOLIC PANEL
CO2: 23 mEq/L (ref 19–32)
Calcium: 8.3 mg/dL — ABNORMAL LOW (ref 8.4–10.5)
GFR calc Af Amer: >90 mL/min (ref >90)
GFR calc non Af Amer: >90 mL/min (ref >90)
Sodium: 132 mEq/L — ABNORMAL LOW (ref 135–145)

## 2013-02-08 LAB — CBC
MCH: 29.7 pg (ref 26.0–34.0)
MCHC: 33.9 g/dL (ref 30.0–36.0)
Platelets: 154 10*3/uL (ref 150–400)
RBC: 3.64 MIL/uL — ABNORMAL LOW (ref 3.87–5.11)
RDW: 13.8 % (ref 11.5–15.5)

## 2013-02-08 LAB — GLUCOSE, CAPILLARY
Glucose-Capillary: 172 mg/dL — ABNORMAL HIGH (ref 70–99)
Glucose-Capillary: 96 mg/dL (ref 70–99)

## 2013-02-08 LAB — MAGNESIUM: Magnesium: 1.7 mg/dL (ref 1.5–2.5)

## 2013-02-08 LAB — TROPONIN I
Troponin I: <0.3 ng/mL (ref <0.30)
Troponin I: <0.3 ng/mL (ref <0.30)

## 2013-02-08 MED ORDER — DOCUSATE SODIUM 50 MG/5ML PO LIQD
100.0000 mg | Freq: Two times a day (BID) | ORAL | Status: DC
  Administered 2013-02-08 – 2013-02-09 (×4): 100 mg
  Filled 2013-02-08 (×6): qty 10

## 2013-02-08 MED ORDER — SODIUM CHLORIDE 0.9 % IJ SOLN: 10.0000 mL | Freq: Two times a day (BID) | INTRAMUSCULAR | Status: DC

## 2013-02-08 MED ORDER — ADULT MULTIVITAMIN LIQUID CH
5.0000 mL | Freq: Every day | ORAL | Status: DC
  Administered 2013-02-09: 5 mL
  Filled 2013-02-08 (×2): qty 5

## 2013-02-08 MED ORDER — SODIUM CHLORIDE 0.9 % IJ SOLN
10.0000 mL | INTRAMUSCULAR | Status: DC | PRN
  Administered 2013-02-08: 20 mL
  Administered 2013-02-09 (×3): 10 mL

## 2013-02-08 MED ORDER — POTASSIUM CHLORIDE 10 MEQ/100ML IV SOLN
10.0000 meq | INTRAVENOUS | Status: AC
  Administered 2013-02-08 (×4): 10 meq via INTRAVENOUS
  Filled 2013-02-08 (×4): qty 100

## 2013-02-08 MED ORDER — METOPROLOL TARTRATE 25 MG/10 ML ORAL SUSPENSION
25.0000 mg | Freq: Two times a day (BID) | ORAL | Status: DC
  Administered 2013-02-08 – 2013-02-09 (×4): 25 mg
  Filled 2013-02-08 (×6): qty 10

## 2013-02-08 MED ORDER — OSMOLITE 1.2 CAL PO LIQD
1000.0000 mL | ORAL | Status: DC
  Administered 2013-02-08 (×2): 1000 mL
  Filled 2013-02-08 (×2): qty 1000

## 2013-02-08 MED ORDER — SUCRALFATE 1 GM/10ML PO SUSP
1.0000 g | Freq: Three times a day (TID) | ORAL | Status: DC
  Administered 2013-02-08 – 2013-02-10 (×7): 1 g via ORAL
  Filled 2013-02-08 (×11): qty 10

## 2013-02-08 MED ORDER — FREE WATER
60.0000 mL | Freq: Four times a day (QID) | Status: DC
  Administered 2013-02-08 – 2013-02-09 (×7): 60 mL

## 2013-02-08 MED ORDER — GABAPENTIN 250 MG/5ML PO SOLN
600.0000 mg | Freq: Three times a day (TID) | ORAL | Status: DC
  Administered 2013-02-08 – 2013-02-09 (×5): 600 mg
  Filled 2013-02-08 (×8): qty 12

## 2013-02-08 MED ORDER — DEXTROSE 50 % IV SOLN
INTRAVENOUS | Status: AC
  Administered 2013-02-08: 25 mL
  Filled 2013-02-08: qty 50

## 2013-02-08 MED ORDER — SUCRALFATE 1 GM/10ML PO SUSP
1.0000 g | Freq: Three times a day (TID) | ORAL | Status: DC
  Filled 2013-02-08 (×3): qty 10

## 2013-02-08 NOTE — Progress Notes (Signed)
Patient continues to call out every 1-2 hours requesting for pain medicine.  The last dose was given at 3:30 and patient was calling out requesting for more pain meds at 4:30.  Flonnie Hailstone, RN

## 2013-02-08 NOTE — Progress Notes (Signed)
Pt called out for every 2 hour pain medication Morphine 2 mg IV. After administration, pt c/o chest pain 10/10. VS were taken 99, 140/102, 100% ra. EKG completed, first nitroglycerin given at 2325. CP continued after 5 mins second Nitroglycerin given 2330, VS 95, 130/83. At 2335 VS 96,132/86, 100 % ra and CP resolved 0/10. M.D. Contacted information reported. Will continue to monitor.

## 2013-02-08 NOTE — Progress Notes (Signed)
[facility] PROGRESS NOTE  [name] [id number] DOB: 10-Mar-1971 DOA: 02/06/2013 PCP: [name], MD  Assessment/Plan:  1-Hematemesis most likely due to recurrent bouts of emesis (suspect Tillman Abide). No more hematemesis.  Hb decrease today. Will repeat in am. Check guaiac stool.    2. Nausea and vomiting/gastroparesis  - Most likely due to gastroparesis given history  - Alk phos is currently elevated.  RUQ ultrasound only show prior cholecystectomy.   -Continue with Reglan and antiemetic.s -still with significant nausea. Will try oral medication for pain.  -Will start low rate tube feeding.   3. Hypokalemia  -Replete wit 4 runs IV.  4. DM  -Continue with SSI. Episode of hypoglycemia. Will start tube feeding today.   5. HTN  - Continue home regimen with PRN IV hydralazine for SBP > 180 or DBP > 110   6. GERD  - continue with  protonix 40 mg IV BID   7. DVT prophylaxis  - Avoid heparin given # 1 will place on SCDs 8-Chest pain: Likely GI origin. Troponin negative. Start Carafate.   Code Status: Full  Family Communication: Care discussed with patient.  Disposition Plan: Home when stable.    Consultants:  IR  Procedures:  Abdominal US:  1. Somewhat echogenic right renal parenchyma of questionable  significance. Correlate with renal laboratory values. No  hydronephrosis.  2. The liver is unremarkable.  3. Prior cholecystectomy   Antibiotics:  none  HPI/Subjective: Still with nausea , vomiting and abdominal pain. She relates water stool today and yesterday.   Objective: Filed Vitals:   02/07/13 2330 02/07/13 2335 02/08/13 0629 02/08/13 0837  BP: 130/83 132/86 157/92   Pulse: 95 96 93   Temp:   99 F (37.2 C)   TempSrc:   Oral   Resp:   20   Height:      Weight:      SpO2: 100% 100% 99% 99%    Intake/Output Summary (Last 24 hours) at 02/08/13 1226 Last data filed at 02/08/13 0745  Gross per 24 hour  Intake   2950 ml  Output   1650  ml  Net   1300 ml   Filed Weights   02/06/13 0824  Weight: 64 kg (141 lb 1.5 oz)    Exam:   General:  No distress.   Cardiovascular: S 1, S 2 RRR  Respiratory: CTA  Abdomen: BS present, soft, generalized tenderness.   Musculoskeletal: no edema  Data Reviewed: Basic Metabolic Panel:  Recent Labs Lab 02/06/13 0210 02/07/13 0420 02/08/13 0155  NA 130* 137 132*  K 3.0* 3.0* 3.3*  CL 86* 100 101  CO2 25 26 23   GLUCOSE 402* 139* 100*  BUN 13 8 8   CREATININE 0.58 0.51 0.56  CALCIUM 10.7* 8.7 8.3*  MG 1.3*  --  1.7  PHOS 4.5  --   --    Liver Function Tests:  Recent Labs Lab 02/06/13 0210 02/07/13 0420  AST 15 11  ALT 11 6  ALKPHOS 149* 106  BILITOT 0.3 0.3  PROT 8.8* 6.7  ALBUMIN 3.3* 2.3*    Recent Labs Lab 02/06/13 0210  LIPASE 23   No results found for this basename: AMMONIA,  in the last 168 hours CBC:  Recent Labs Lab 02/06/13 0320 02/07/13 0420 02/08/13 0155  WBC 14.1* 12.0* 8.0  NEUTROABS 12.2*  --   --   HGB 13.0 12.4 10.8*  HCT 37.0 36.7 31.9*  MCV 85.5 86.6 87.6  PLT 219 196 154  Cardiac Enzymes:  Recent Labs Lab 02/08/13 0155 02/08/13 0900  TROPONINI <0.30 <0.30   BNP (last 3 results) No results found for this basename: PROBNP,  in the last 8760 hours CBG:  Recent Labs Lab 02/08/13 0006 02/08/13 0359 02/08/13 0803 02/08/13 0846 02/08/13 1220  GLUCAP 96 100* 67* 126* 101*    No results found for this or any previous visit (from the past 240 hour(s)).   Studies: US Abdomen Complete  02/06/2013   *RADIOLOGY REPORT*  Clinical Data:  Elevated alkaline phosphatase level, hypertension, diabetes  COMPLETE ABDOMINAL ULTRASOUND  Comparison:  CT abdomen pelvis of 02/06/2013  Findings:  Gallbladder:  The gallbladder has previously been resected.  Common bile duct:  The common bile duct is normal measuring 4.6 mm in diameter.  Liver:  The liver is unremarkable with no focal abnormality noted. No ductal dilatation.  IVC:   Appears normal.  Pancreas:  The midportion of the pancreas appears normal with the head and tail obscured by bowel gas.  Spleen:  Spleen is normal measuring 5.3 cm sagittally.  Right Kidney:  No hydronephrosis is seen.  The right kidney measures 13.3 cm sagittally and does appear to be somewhat echogenic.  Chronic renal medical disease is a consideration.  Left Kidney:  No hydronephrosis is seen.  The left kidney measures 14.2 cm sagittally and echogenicity of the renal parenchyma appears more normal.  Abdominal aorta:  The abdominal aorta is normal in caliber.  IMPRESSION:  1.  Somewhat echogenic right renal parenchyma of questionable significance.  Correlate with renal laboratory values.  No hydronephrosis. 2.  The liver is unremarkable. 3.  Prior cholecystectomy   Original Report Authenticated By: Ivar Drape, M.D.   Ir Replc Duoden/jejuno Tube Percut W/fluoro  02/07/2013   *RADIOLOGY REPORT*  Clinical Data: Malpositioned jejunal feeding tube coiled in the stomach.  FLUOROSCOPIC REPLACEMENT OF THE 18-FRENCH JEJUNAL FEEDING TUBE  Date:  02/07/2013 10:45:00  Radiologist:  M. Daryll Brod, M.D.  Medications:  None.  Guidance:  Fluoroscopic  Fluoroscopy time:  6 minutes  Sedation time:  None.  Contrast volume:  10 ml [id number]  Complications:  No immediate  PROCEDURE/FINDINGS:  Informed consent was obtained from the patient following explanation of the procedure, risks, benefits and alternatives. The patient understands, agrees and consents for the procedure. All questions were addressed.  A time out was performed.  Maximal barrier sterile technique utilized including caps, mask, sterile gowns, sterile gloves, large sterile drape, hand hygiene, and betadine  Under sterile conditions, the existing malpositioned feeding tube was removed.  A C2 catheter and a glidewire were utilized to manipulate the access into the proximal jejunum.  Over the guide wire, a new 18-French balloon retention jejunostomy was advanced.  Tip position confirmed the proximal jejunum with a contrast injection.  Images obtained for documentation.  Retention balloon inflated with 10 ml saline and retracted against anterior gastric wall.  No immediate complication.  The patient tolerated the procedure well.  IMPRESSION: Successful fluoroscopic replacement of the 18-French jejunostomy. Tip in the proximal jejunum ready for use   Original Report Authenticated By: Jerilynn Mages. Shick, M.D.    Scheduled Meds: . amLODipine  10 mg Oral Daily  . docusate  100 mg Per Tube BID  . feeding supplement (OSMOLITE 1.2 CAL)  1,000 mL Per Tube Q24H  . gabapentin  600 mg Per Tube TID  . insulin aspart  0-5 Units Subcutaneous QHS  . insulin aspart  0-9 Units Subcutaneous Q4H  . metoCLOPramide (REGLAN) injection  10 mg Intravenous Q6H  . metoprolol tartrate  25 mg Per Tube BID  . mometasone-formoterol  2 puff Inhalation BID  . [START ON 02/09/2013] multivitamin  5 mL Per Tube Daily  . pantoprazole (PROTONIX) IV  40 mg Intravenous Q12H  . potassium chloride  10 mEq Intravenous Q1 Hr x 4  . potassium chloride  40 mEq Oral Once  . sodium chloride  3 mL Intravenous Q12H   Continuous Infusions: . sodium chloride    . 0.9 % NaCl with KCl 20 mEq / L 125 mL/hr at 02/08/13 [id number]    Active Problems:   Gastroparesis   Nausea and vomiting   Dyslipidemia   GERD (gastroesophageal reflux disease)   Hypertension   DM (diabetes mellitus), type 2, uncontrolled   Hypokalemia   Abdominal pain   Hematemesis    Time spent: 25 minutes    REGALADO,BELKYS  [facility] Pager [phone]. If 7PM-7AM, please contact night-coverage at [email or web address], password [facility] 02/08/2013, 12:26 PM  LOS: 2 days

## 2013-02-08 NOTE — Procedures (Signed)
Procedure:  Right arm PICC placement Access:  Right brachial vein 5 Fr, 39 cm DL PICC placed with tip at cavoatrial junction.

## 2013-02-08 NOTE — Progress Notes (Signed)
NUTRITION FOLLOW UP  Intervention:   - Initiate trickle TF of Osmolite 1.2 at 75ml/hr via J tube. Hopefully as pt's nausea improves, TF can be advanced to goal rate of 33ml/hr.  - Will continue to monitor   Nutrition Dx:   Inadequate oral intake related to inability to eat as evidenced by NPO - ongoing    Goal:   1. No further nausea/vomiting - not met, pt with nausea, no vomiting 2. Resume home TF regimen - not met 3. Advance diet as tolerated to diabetic diet - not met  Monitor:   Weights, labs, TF tolerance, nausea, diet advancement  Assessment:   Noted CT of abdomen showed coiled jejunal feeding tube in stomach, was repositioned yesterday and ready for use. Met with pt who c/o pain, notified RN. Pt said she had some nausea, no vomiting, and some gas.    Discussed with MD pt's nutrition plans and resuming home TF. MD expressed concerned over pt's vomiting, RD discussed pt's problems with ongoing nausea/vomiting and that with J tube feedings would be less likely to contribute to nausea compared to G tube feedings. RN stated pt has not had any vomiting today thinks pt would benefit from getting some TF for nutrition.   Noted pt with low sodium and potassium. Pt getting oral multivitamin.   Height: Ht Readings from Last 1 Encounters:  02/06/13 5\' 5"  (1.651 m)    Weight Status:   Wt Readings from Last 1 Encounters:  02/06/13 141 lb 1.5 oz (64 kg)    Re-estimated needs:  Kcal: 1450-1650 Protein: 60-70g Fluid: 1.4-1.6L/day  Skin: Intact  Diet Order: NPO   Intake/Output Summary (Last 24 hours) at 02/08/13 1016 Last data filed at 02/08/13 0745  Gross per 24 hour  Intake   2950 ml  Output   2150 ml  Net    800 ml    Last BM: 7/9   Labs:   Recent Labs Lab 02/06/13 0210 02/07/13 0420 02/08/13 0155  NA 130* 137 132*  K 3.0* 3.0* 3.3*  CL 86* 100 101  CO2 25 26 23   BUN 13 8 8   CREATININE 0.58 0.51 0.56  CALCIUM 10.7* 8.7 8.3*  MG 1.3*  --  1.7  PHOS 4.5   --   --   GLUCOSE 402* 139* 100*    CBG (last 3)   Recent Labs  02/08/13 0359 02/08/13 0803 02/08/13 0846  GLUCAP 100* 67* 126*    Scheduled Meds: . amLODipine  10 mg Oral Daily  . docusate sodium  100 mg Oral BID  . gabapentin  600 mg Oral TID  . insulin aspart  0-5 Units Subcutaneous QHS  . insulin aspart  0-9 Units Subcutaneous Q4H  . metoCLOPramide (REGLAN) injection  10 mg Intravenous Q6H  . metoprolol tartrate  25 mg Per Tube BID  . mometasone-formoterol  2 puff Inhalation BID  . multivitamin with minerals  1 tablet Oral Daily  . pantoprazole (PROTONIX) IV  40 mg Intravenous Q12H  . potassium chloride  10 mEq Intravenous Q1 Hr x 4  . potassium chloride  40 mEq Oral Once  . sodium chloride  3 mL Intravenous Q12H    Continuous Infusions: . sodium chloride    . 0.9 % NaCl with KCl 20 mEq / L 125 mL/hr at 02/08/13 [place] MS, RD, [place] Pager [phone] After Hours Pager

## 2013-02-08 NOTE — Progress Notes (Signed)
Hypoglycemic Event  CBG: 67  Treatment: D50 IV 25 mL  Symptoms: None  Follow-up CBG: Time 0845 CBG Result:126  Possible Reasons for Event: Inadequate meal intake  Comments/MD notified  Yes     Darrow Bussing D  Remember to initiate Hypoglycemia Order Set & complete

## 2013-02-09 LAB — GLUCOSE, CAPILLARY
Glucose-Capillary: 143 mg/dL — ABNORMAL HIGH (ref 70–99)
Glucose-Capillary: 206 mg/dL — ABNORMAL HIGH (ref 70–99)

## 2013-02-09 LAB — CBC
MCH: 29.4 pg (ref 26.0–34.0)
MCV: 88.6 fL (ref 78.0–100.0)
Platelets: 159 10*3/uL (ref 150–400)
RBC: 3.67 MIL/uL — ABNORMAL LOW (ref 3.87–5.11)
RDW: 13.4 % (ref 11.5–15.5)
WBC: 6.8 10*3/uL (ref 4.0–10.5)

## 2013-02-09 LAB — BASIC METABOLIC PANEL
Calcium: 8.7 mg/dL (ref 8.4–10.5)
Creatinine, Ser: 0.5 mg/dL (ref 0.50–1.10)
GFR calc Af Amer: >90 mL/min (ref >90)
GFR calc non Af Amer: >90 mL/min (ref >90)
Sodium: 133 mEq/L — ABNORMAL LOW (ref 135–145)

## 2013-02-09 MED ORDER — MORPHINE SULFATE 2 MG/ML IJ SOLN
2.0000 mg | INTRAMUSCULAR | Status: DC | PRN
  Administered 2013-02-09 – 2013-02-10 (×6): 2 mg via INTRAVENOUS
  Filled 2013-02-09 (×6): qty 1

## 2013-02-09 MED ORDER — OSMOLITE 1.2 CAL PO LIQD
1000.0000 mL | ORAL | Status: DC
  Filled 2013-02-09 (×2): qty 1000

## 2013-02-09 MED ORDER — PANTOPRAZOLE SODIUM 40 MG PO TBEC
40.0000 mg | DELAYED_RELEASE_TABLET | Freq: Two times a day (BID) | ORAL | Status: DC
  Administered 2013-02-10: 40 mg via ORAL
  Filled 2013-02-09 (×3): qty 1

## 2013-02-09 NOTE — Progress Notes (Signed)
[facility] PROGRESS NOTE  [name] [id number] DOB: October 10, 1970 DOA: 02/06/2013 PCP: [name], MD  Assessment/Plan:  1-Hematemesis most likely due to recurrent bouts of emesis (suspect Tillman Abide). No more hematemesis.  Hb decrease to 10 from 13 on admission. Last month Hb at 9 to 10. Probably component of hemoconcentration on admission.  Marland Kitchen guaiac stool ordered.     2. Nausea and vomiting/gastroparesis  -Improving, feeling better.  - Most likely due to gastroparesis given history  - Alk phos is currently elevated.  RUQ ultrasound only show prior cholecystectomy.   -Continue with Reglan and antiemetic.s -Continue with oral medication for pain.  -Advance  tube feeding.  -Started on Carafate.   3. Hypokalemia  -B-met pending.   4. DM  -Continue with SSI.  -Hypoglycemia, resolved.   5. HTN  - Continue home regimen with PRN IV hydralazine for SBP > 180 or DBP > 110   6. GERD  - continue with  Protonix, change to oral.   7. DVT prophylaxis  - Avoid heparin given # 1 will place on SCDs  8-Chest pain: Likely GI origin. Troponin negative. Resolved.   Code Status: Full  Family Communication: Care discussed with patient.  Disposition Plan: Home  In 1 or 2 days.   Consultants:  IR  Procedures:  Abdominal US:  1. Somewhat echogenic right renal parenchyma of questionable  significance. Correlate with renal laboratory values. No  hydronephrosis.  2. The liver is unremarkable.  3. Prior cholecystectomy   Antibiotics:  none  HPI/Subjective: Feels better overall. Nausea better. Will try clears diet today.    Objective: Filed Vitals:   02/08/13 1929 02/08/13 1959 02/09/13 0629 02/09/13 0814  BP:  155/83 127/83   Pulse:  84 80   Temp:  97.9 F (36.6 C) 98 F (36.7 C)   TempSrc:  Oral Oral   Resp:  20 16   Height:      Weight:      SpO2: 99% 100% 100% 100%    Intake/Output Summary (Last 24 hours) at 02/09/13 0937 Last data filed at 02/09/13  0700  Gross per 24 hour  Intake 4003.75 ml  Output   3300 ml  Net 703.75 ml   Filed Weights   02/06/13 0824  Weight: 64 kg (141 lb 1.5 oz)    Exam:   General:  No distress.   Cardiovascular: S 1, S 2 RRR  Respiratory: CTA  Abdomen: BS present, soft, generalized tenderness.   Musculoskeletal: no edema  Data Reviewed: Basic Metabolic Panel:  Recent Labs Lab 02/06/13 0210 02/07/13 0420 02/08/13 0155  NA 130* 137 132*  K 3.0* 3.0* 3.3*  CL 86* 100 101  CO2 25 26 23   GLUCOSE 402* 139* 100*  BUN 13 8 8   CREATININE 0.58 0.51 0.56  CALCIUM 10.7* 8.7 8.3*  MG 1.3*  --  1.7  PHOS 4.5  --   --    Liver Function Tests:  Recent Labs Lab 02/06/13 0210 02/07/13 0420  AST 15 11  ALT 11 6  ALKPHOS 149* 106  BILITOT 0.3 0.3  PROT 8.8* 6.7  ALBUMIN 3.3* 2.3*    Recent Labs Lab 02/06/13 0210  LIPASE 23   No results found for this basename: AMMONIA,  in the last 168 hours CBC:  Recent Labs Lab 02/06/13 0320 02/07/13 0420 02/08/13 0155 02/09/13 0840  WBC 14.1* 12.0* 8.0 6.8  NEUTROABS 12.2*  --   --   --   HGB 13.0 12.4 10.8*  10.8*  HCT 37.0 36.7 31.9* 32.5*  MCV 85.5 86.6 87.6 88.6  PLT 219 196 154 159   Cardiac Enzymes:  Recent Labs Lab 02/08/13 0155 02/08/13 0900  TROPONINI <0.30 <0.30   BNP (last 3 results) No results found for this basename: PROBNP,  in the last 8760 hours CBG:  Recent Labs Lab 02/08/13 1641 02/08/13 1958 02/08/13 2339 02/09/13 0346 02/09/13 0730  GLUCAP 106* 127* 172* 152* 143*    No results found for this or any previous visit (from the past 240 hour(s)).   Studies: Ir Fluoro Guide Cv Line Right  02/08/2013   *RADIOLOGY REPORT*  Clinical Data: Nausea, vomiting, abdominal pain, diabetic gastroparesis and poor IV access.  PICC LINE PLACEMENT WITH ULTRASOUND AND FLUOROSCOPIC  GUIDANCE  Fluoroscopy Time: 18 seconds  The right arm was prepped with chlorhexidine, draped in the usual sterile fashion using maximum  barrier technique (cap and mask, sterile gown, sterile gloves, large sterile sheet, hand hygiene and cutaneous antisepsis) and infiltrated locally with 1% Lidocaine.  Ultrasound demonstrated patency of the right brachial vein, and this was documented with an image.  Under real-time ultrasound guidance, this vein was accessed with a 21 gauge micropuncture needle and image documentation was performed.  The needle was exchanged over a guidewire for a peel-away sheath through which a 5 [place] dual lumen PICC trimmed to 39 cm was advanced, positioned with its tip at the lower SVC/right atrial junction.  Fluoroscopy during the procedure and fluoro spot radiograph confirms appropriate catheter position.  The catheter was flushed, secured to the skin with Prolene sutures, and covered with a sterile dressing.  Complications:  None  IMPRESSION: Successful right arm PICC line placement with ultrasound and fluoroscopic guidance.  The catheter is ready for use.   Original Report Authenticated By: Aletta Edouard, M.D.   Ir Replc Duoden/jejuno Tube Percut W/fluoro  02/07/2013   *RADIOLOGY REPORT*  Clinical Data: Malpositioned jejunal feeding tube coiled in the stomach.  FLUOROSCOPIC REPLACEMENT OF THE 18-FRENCH JEJUNAL FEEDING TUBE  Date:  02/07/2013 10:45:00  Radiologist:  M. Daryll Brod, M.D.  Medications:  None.  Guidance:  Fluoroscopic  Fluoroscopy time:  6 minutes  Sedation time:  None.  Contrast volume:  10 ml [id number]  Complications:  No immediate  PROCEDURE/FINDINGS:  Informed consent was obtained from the patient following explanation of the procedure, risks, benefits and alternatives. The patient understands, agrees and consents for the procedure. All questions were addressed.  A time out was performed.  Maximal barrier sterile technique utilized including caps, mask, sterile gowns, sterile gloves, large sterile drape, hand hygiene, and betadine  Under sterile conditions, the existing malpositioned feeding tube  was removed.  A C2 catheter and a glidewire were utilized to manipulate the access into the proximal jejunum.  Over the guide wire, a new 18-French balloon retention jejunostomy was advanced. Tip position confirmed the proximal jejunum with a contrast injection.  Images obtained for documentation.  Retention balloon inflated with 10 ml saline and retracted against anterior gastric wall.  No immediate complication.  The patient tolerated the procedure well.  IMPRESSION: Successful fluoroscopic replacement of the 18-French jejunostomy. Tip in the proximal jejunum ready for use   Original Report Authenticated By: Jerilynn Mages. Annamaria Boots, M.D.   Ir US Guide Vasc Access Right  02/08/2013   *RADIOLOGY REPORT*  Clinical Data: Nausea, vomiting, abdominal pain, diabetic gastroparesis and poor IV access.  PICC LINE PLACEMENT WITH ULTRASOUND AND FLUOROSCOPIC  GUIDANCE  Fluoroscopy Time: 18 seconds  The right  arm was prepped with chlorhexidine, draped in the usual sterile fashion using maximum barrier technique (cap and mask, sterile gown, sterile gloves, large sterile sheet, hand hygiene and cutaneous antisepsis) and infiltrated locally with 1% Lidocaine.  Ultrasound demonstrated patency of the right brachial vein, and this was documented with an image.  Under real-time ultrasound guidance, this vein was accessed with a 21 gauge micropuncture needle and image documentation was performed.  The needle was exchanged over a guidewire for a peel-away sheath through which a 5 [place] dual lumen PICC trimmed to 39 cm was advanced, positioned with its tip at the lower SVC/right atrial junction.  Fluoroscopy during the procedure and fluoro spot radiograph confirms appropriate catheter position.  The catheter was flushed, secured to the skin with Prolene sutures, and covered with a sterile dressing.  Complications:  None  IMPRESSION: Successful right arm PICC line placement with ultrasound and fluoroscopic guidance.  The catheter is ready for use.    Original Report Authenticated By: Aletta Edouard, M.D.    Scheduled Meds: . amLODipine  10 mg Oral Daily  . docusate  100 mg Per Tube BID  . feeding supplement (OSMOLITE 1.2 CAL)  1,000 mL Per Tube Q24H  . free water  60 mL Per Tube QID  . gabapentin  600 mg Per Tube TID  . insulin aspart  0-5 Units Subcutaneous QHS  . insulin aspart  0-9 Units Subcutaneous Q4H  . metoCLOPramide (REGLAN) injection  10 mg Intravenous Q6H  . metoprolol tartrate  25 mg Per Tube BID  . mometasone-formoterol  2 puff Inhalation BID  . multivitamin  5 mL Per Tube Daily  . pantoprazole  40 mg Oral BID  . potassium chloride  40 mEq Oral Once  . sodium chloride  10-40 mL Intracatheter Q12H  . sodium chloride  3 mL Intravenous Q12H  . sucralfate  1 g Oral TID WC & HS   Continuous Infusions: . 0.9 % NaCl with KCl 20 mEq / L 125 mL/hr at 02/09/13 [id number]    Active Problems:   Gastroparesis   Nausea and vomiting   Dyslipidemia   GERD (gastroesophageal reflux disease)   Hypertension   DM (diabetes mellitus), type 2, uncontrolled   Hypokalemia   Abdominal pain   Hematemesis    Time spent: 25 minutes    Viola Kinnick  [facility] Pager [phone]. If 7PM-7AM, please contact night-coverage at [email or web address], password [facility] 02/09/2013, 9:37 AM  LOS: 3 days

## 2013-02-09 NOTE — Progress Notes (Signed)
Brief Nutrition Note:   RD paged by RN to advance pt tube feed per MD's request. Chart reviewed, pt's goal rate for TF is 50 ml/hr. Pt is not having any N/V at this time. RD will update orders to advance Osmolite 1.2 by 10 ml q 4 hr to a goal rate of 50 ml/hr as tolerated.   RD will f/u per protocol.  Please consult/page as needed.   Orson Slick RD, LDN Pager [phone] After Hours pager [phone]

## 2013-02-10 LAB — GLUCOSE, CAPILLARY
Glucose-Capillary: 174 mg/dL — ABNORMAL HIGH (ref 70–99)
Glucose-Capillary: 157 mg/dL — ABNORMAL HIGH (ref 70–99)

## 2013-02-10 LAB — BASIC METABOLIC PANEL
BUN: 6 mg/dL (ref 6–23)
Calcium: 8.5 mg/dL (ref 8.4–10.5)
Creatinine, Ser: 0.5 mg/dL (ref 0.50–1.10)
GFR calc Af Amer: >90 mL/min (ref >90)
GFR calc non Af Amer: >90 mL/min (ref >90)
Glucose, Bld: 196 mg/dL — ABNORMAL HIGH (ref 70–99)

## 2013-02-10 MED ORDER — SUCRALFATE 1 GM/10ML PO SUSP: 1.0000 g | Freq: Three times a day (TID) | ORAL | Status: DC

## 2013-02-10 MED ORDER — OXYCODONE HCL 5 MG PO TABS: 5.0000 mg | ORAL_TABLET | Freq: Four times a day (QID) | ORAL | Status: DC | PRN

## 2013-02-10 NOTE — Discharge Summary (Addendum)
Physician Discharge Summary  [name] [id number] DOB: 1971/06/21 DOA: 02/06/2013  PCP: [name], MD  Admit date: 02/06/2013 Discharge date: 02/10/2013  Time spent: 35 minutes  Recommendations for Outpatient Follow-up:  1. Need follow up with primary gastroenterologist for further treatment of gastroparesis.    Discharge Diagnoses:    Exacerbation of Gastroparesis   suspect Mallory Weis tear.    Dyslipidemia   GERD (gastroesophageal reflux disease)   Hypertension   DM (diabetes mellitus), type 2, uncontrolled   Hypokalemia   Abdominal pain   Hematemesis   Discharge Condition: Stable.   Diet recommendation: Clear diet  Filed Weights   02/06/13 0824  Weight: 64 kg (141 lb 1.5 oz)    History of present illness:  [name] is a 42 y.o. female  With history of diabetes, HTN, GERD, and diabetic gastroparesis. Has presented and been admitted to the hospital for recurrent nausea and vomiting due to her diabetic gastroparesis with her last admission on 01/19/13 for the complaints listed above which was felt to be 2ary to gastroparesis and she was treated with Reglan 10 mg IV q 6 hours and pain control with oxycodone.  She reports that for the last 3 days she has had nausea and emesis. Has had more than 10 bouts of emesis reportedly since onset of symptoms 3 days ago. History is limited as patient is uncomfortable and with nausea. Also endorses diffuse abdominal discomfort made worse by consumption of food. This morning she states she had a bout of emesis and noticed some blood tinged emesis   Hospital Course:  1-Hematemesis  Most likely due to recurrent bouts of emesis (suspect Tillman Abide). No more hematemesis.  Hb decrease to 10 from 13 on admission. Last month Hb at 9 to 10. Probably component of hemoconcentration on admission.  Resolved.   2. Nausea and vomiting, abdominal pain/gastroparesis  -She had a CT scan : Jejunal feeding tube coiled in the stomach. Thickened  distalesophageal wall with an associated hiatal hernia, suspectchronicreflux/esophagitis.  Prior cholecystectomy and hysterectomy. No acute intra-abdominal or pelvic process by noncontrast imaging.  -IR review peg tube.  - Most likely due to gastroparesis given history  - Alk phos is currently elevated. RUQ ultrasound only show prior cholecystectomy.  -Continue with Reglan and antiemetic.s  -Continue with oral medication for pain.  -Advance tube feeding.  -Started on Carafate.  3. Hypokalemia  -Resolved. 4. DM  -Continue with SSI.  -Hypoglycemia, resolved. Tube feeding increase.  5. HTN  - Continue home regimen. 6. GERD  - continue with Protonix, change to oral.  7. DVT prophylaxis  - Avoid heparin given # 1 will place on SCDs  8-Chest pain: Likely GI origin. Troponin negative. Improved with Carafate. I gave her refill for opioid # 15 tablets until she follow up with PCP.    Procedures:  Peg tube review by IR.   Consultations:  none  Discharge Exam: Filed Vitals:   02/09/13 1944 02/09/13 2126 02/10/13 0515 02/10/13 0801  BP:  139/92 155/92   Pulse:  88 85   Temp:  98.3 F (36.8 C) 98.2 F (36.8 C)   TempSrc:  Oral Oral   Resp: 20 20 20    Height:      Weight:      SpO2:  100% 100% 100%    General: no distress.  Cardiovascular: S 1, S 2 RRR Respiratory: CTA Abdomen: soft, mild tender.   Discharge Instructions  Discharge Orders   Future Orders Complete By Expires  Increase activity slowly  As directed         Medication List         amLODipine 10 MG tablet  Commonly known as:  NORVASC  Take 1 tablet (10 mg total) by mouth daily.     atorvastatin 40 MG tablet  Commonly known as:  LIPITOR  Take 1 tablet (40 mg total) by mouth daily at 6 PM.     docusate sodium 100 MG capsule  Commonly known as:  COLACE  Take 100 mg by mouth 2 (two) times daily.     esomeprazole 40 MG capsule  Commonly known as:  NEXIUM  Take 1 capsule (40 mg total) by mouth 2  (two) times daily.     feeding supplement (OSMOLITE 1.2 CAL) Liqd  Place 1,000 mLs into feeding tube continuous. Rate:9ml/hr     Fluticasone-Salmeterol 250-50 MCG/DOSE Aepb  Commonly known as:  ADVAIR  Inhale 1 puff into the lungs 2 (two) times daily.     gabapentin 300 MG capsule  Commonly known as:  NEURONTIN  Take 2 capsules (600 mg total) by mouth 3 (three) times daily.     insulin glargine 100 UNIT/ML injection  Commonly known as:  LANTUS  Inject 0.1 mLs (10 Units total) into the skin at bedtime.     insulin lispro 100 UNIT/ML injection  Commonly known as:  HUMALOG  Inject 10-20 Units into the skin 3 (three) times daily before meals. Sliding scale     metoCLOPramide 10 MG tablet  Commonly known as:  REGLAN  Take 10 mg by mouth 4 (four) times daily.     metoprolol succinate 50 MG 24 hr tablet  Commonly known as:  TOPROL XL  Take 1 tablet (50 mg total) by mouth daily. Take with or immediately following a meal.     multivitamin with minerals Tabs  Take 1 tablet by mouth daily.     oxyCODONE 5 MG immediate release tablet  Commonly known as:  Oxy IR/ROXICODONE  Take 1-2 tablets (5-10 mg total) by mouth every 6 (six) hours as needed.     promethazine 25 MG tablet  Commonly known as:  PHENERGAN  Take 1 tablet (25 mg total) by mouth every 6 (six) hours as needed for nausea.     sucralfate 1 GM/10ML suspension  Commonly known as:  CARAFATE  Take 10 mLs (1 g total) by mouth 4 (four) times daily -  with meals and at bedtime.     zolpidem 10 MG tablet  Commonly known as:  AMBIEN  Take 10 mg by mouth at bedtime as needed for sleep.       Allergies  Allergen Reactions  . Penicillins Hives  . Cefadroxil     Not sure of effects-listed as allergy on list from home  . Compazine Hives  . Darvocet (Propoxyphene-Acetaminophen) Other (See Comments)    UNKNOWN  . Nsaids Hives  . Prochlorperazine     Not sure of effects-listed as allergy on list from home  . Zofran Hives   . Zofran (Ondansetron) Hives      The results of significant diagnostics from this hospitalization (including imaging, microbiology, ancillary and laboratory) are listed below for reference.    Significant Diagnostic Studies: Ct Abdomen Pelvis Wo Contrast  02/06/2013   *RADIOLOGY REPORT*  Clinical Data: Abdominal discomfort, gastroesophageal reflux, diabetic gastroparesis  CT ABDOMEN AND PELVIS WITHOUT CONTRAST  Technique:  Multidetector CT imaging of the abdomen and pelvis was performed following the standard protocol  without intravenous contrast.  Comparison: 12/24/2012  Findings: Lung bases clear.  Normal heart size.  Circumferential wall thickening of the distal esophagus with an associated small hiatal hernia, this can be seen with chronic esophagitis/reflux. No pericardial or pleural effusion.  Abdomen:  The balloon retention gastrojejunostomy is coiled the stomach. Prior cholecystectomy noted.  Liver, biliary system, pancreas, spleen, adrenal glands, and kidneys demonstrate no acute process by noncontrast imaging.  Negative for bowel obstruction, dilatation, ileus pattern, or free air.  No hydronephrosis or obstructing urinary tract calculus demonstrated.  No evidence of hemorrhage, adenopathy, fluid collection, or abscess.  Atherosclerosis of the aorta without aneurysm.  Postop changes of the bowel in the left upper quadrant noted.  Pelvis:  Moderate distention of the urinary bladder.  Prior hysterectomy evident.  No pelvic free fluid, fluid collection, hemorrhage, abscess, inguinal abnormality, or hernia.  Pelvic calcifications noted consistent with venous phleboliths.  Postop changes of the lumbar spine.  IMPRESSION: Jejunal feeding tube coiled in the stomach.  Thickened distal esophageal wall with an associated hiatal hernia, suspect chronic reflux/esophagitis.  Prior cholecystectomy and hysterectomy  No acute intra-abdominal or pelvic process by noncontrast imaging.  No obstructing urinary  tract calculus  Moderate to severe bladder distention   Original Report Authenticated By: Jerilynn Mages. Annamaria Boots, M.D.   US Abdomen Complete  02/06/2013   *RADIOLOGY REPORT*  Clinical Data:  Elevated alkaline phosphatase level, hypertension, diabetes  COMPLETE ABDOMINAL ULTRASOUND  Comparison:  CT abdomen pelvis of 02/06/2013  Findings:  Gallbladder:  The gallbladder has previously been resected.  Common bile duct:  The common bile duct is normal measuring 4.6 mm in diameter.  Liver:  The liver is unremarkable with no focal abnormality noted. No ductal dilatation.  IVC:  Appears normal.  Pancreas:  The midportion of the pancreas appears normal with the head and tail obscured by bowel gas.  Spleen:  Spleen is normal measuring 5.3 cm sagittally.  Right Kidney:  No hydronephrosis is seen.  The right kidney measures 13.3 cm sagittally and does appear to be somewhat echogenic.  Chronic renal medical disease is a consideration.  Left Kidney:  No hydronephrosis is seen.  The left kidney measures 14.2 cm sagittally and echogenicity of the renal parenchyma appears more normal.  Abdominal aorta:  The abdominal aorta is normal in caliber.  IMPRESSION:  1.  Somewhat echogenic right renal parenchyma of questionable significance.  Correlate with renal laboratory values.  No hydronephrosis. 2.  The liver is unremarkable. 3.  Prior cholecystectomy   Original Report Authenticated By: Ivar Drape, M.D.   Ir Fluoro Guide Cv Line Right  02/08/2013   *RADIOLOGY REPORT*  Clinical Data: Nausea, vomiting, abdominal pain, diabetic gastroparesis and poor IV access.  PICC LINE PLACEMENT WITH ULTRASOUND AND FLUOROSCOPIC  GUIDANCE  Fluoroscopy Time: 18 seconds  The right arm was prepped with chlorhexidine, draped in the usual sterile fashion using maximum barrier technique (cap and mask, sterile gown, sterile gloves, large sterile sheet, hand hygiene and cutaneous antisepsis) and infiltrated locally with 1% Lidocaine.  Ultrasound demonstrated patency of  the right brachial vein, and this was documented with an image.  Under real-time ultrasound guidance, this vein was accessed with a 21 gauge micropuncture needle and image documentation was performed.  The needle was exchanged over a guidewire for a peel-away sheath through which a 5 [place] dual lumen PICC trimmed to 39 cm was advanced, positioned with its tip at the lower SVC/right atrial junction.  Fluoroscopy during the procedure and fluoro spot  radiograph confirms appropriate catheter position.  The catheter was flushed, secured to the skin with Prolene sutures, and covered with a sterile dressing.  Complications:  None  IMPRESSION: Successful right arm PICC line placement with ultrasound and fluoroscopic guidance.  The catheter is ready for use.   Original Report Authenticated By: Aletta Edouard, M.D.   Ir Replc Duoden/jejuno Tube Percut W/fluoro  02/07/2013   *RADIOLOGY REPORT*  Clinical Data: Malpositioned jejunal feeding tube coiled in the stomach.  FLUOROSCOPIC REPLACEMENT OF THE 18-FRENCH JEJUNAL FEEDING TUBE  Date:  02/07/2013 10:45:00  Radiologist:  M. Daryll Brod, M.D.  Medications:  None.  Guidance:  Fluoroscopic  Fluoroscopy time:  6 minutes  Sedation time:  None.  Contrast volume:  10 ml [id number]  Complications:  No immediate  PROCEDURE/FINDINGS:  Informed consent was obtained from the patient following explanation of the procedure, risks, benefits and alternatives. The patient understands, agrees and consents for the procedure. All questions were addressed.  A time out was performed.  Maximal barrier sterile technique utilized including caps, mask, sterile gowns, sterile gloves, large sterile drape, hand hygiene, and betadine  Under sterile conditions, the existing malpositioned feeding tube was removed.  A C2 catheter and a glidewire were utilized to manipulate the access into the proximal jejunum.  Over the guide wire, a new 18-French balloon retention jejunostomy was advanced. Tip  position confirmed the proximal jejunum with a contrast injection.  Images obtained for documentation.  Retention balloon inflated with 10 ml saline and retracted against anterior gastric wall.  No immediate complication.  The patient tolerated the procedure well.  IMPRESSION: Successful fluoroscopic replacement of the 18-French jejunostomy. Tip in the proximal jejunum ready for use   Original Report Authenticated By: Jerilynn Mages. Annamaria Boots, M.D.   Ir Replc Duoden/jejuno Tube Percut W/fluoro  01/18/2013   *RADIOLOGY REPORT*  Clinical Data:  Malpositioning of the gastrojejunal feeding catheter with expulsion of the jejunal portion of the catheter out of the patient's mouth.  GASTROJEJUNAL FEEDING TUBE EXCHANGE  Contrast:  20 ml Omnipaque-300  Fluoroscopy Time: 16 minutes, 54 seconds.  Procedure:  The procedure, risks, benefits, and alternatives were explained to the patient.  Questions regarding the procedure were encouraged and answered.  The patient understands and consents to the procedure.  The preexisting gastrojejunal catheter was removed.  The gastric skin entry site was prepped with Betadine in a sterile fashion, and a sterile drape was applied covering the operative field.  A sterile gown and sterile gloves were used for the procedure.  A 5-French dilator was advanced into the stomach.  Contrast injection was performed to confirm intraluminal position.  Over various guide wires, 5-French catheter access was then established through the stomach into the duodenum.  A hydrophilic wire was advanced to the level of the proximal jejunum.  The percutaneous tract to the stomach was dilated up to 20-French. An 65 French balloon retention single-lumen gastrojejunal tube was then advanced.  Final catheter position was confirmed with a fluoroscopic spot image obtained after injection of contrast.  Complications:  None  Findings:  Due to tortuosity of the distal stomach and proximal duodenum, it was very difficult to gain durable  guide wire access to the level of the jejunum.  Ultimately, a single-lumen balloon retention jejunostomy catheter was advanced and positioned with the tip in the proximal jejunum.  A standard 18 [place] G J-tube would not advance over a wire due to the tortuous course and friction over the guidewire.  IMPRESSION: Removal of malpositioned gastrojejunal catheter  with placement of an 18-French balloon retention jejunostomy catheter through the stomach and to the level of the proximal jejunum.  This catheter may be used immediately.   Original Report Authenticated By: Aletta Edouard, M.D.   Ir US Guide Vasc Access Right  02/08/2013   *RADIOLOGY REPORT*  Clinical Data: Nausea, vomiting, abdominal pain, diabetic gastroparesis and poor IV access.  PICC LINE PLACEMENT WITH ULTRASOUND AND FLUOROSCOPIC  GUIDANCE  Fluoroscopy Time: 18 seconds  The right arm was prepped with chlorhexidine, draped in the usual sterile fashion using maximum barrier technique (cap and mask, sterile gown, sterile gloves, large sterile sheet, hand hygiene and cutaneous antisepsis) and infiltrated locally with 1% Lidocaine.  Ultrasound demonstrated patency of the right brachial vein, and this was documented with an image.  Under real-time ultrasound guidance, this vein was accessed with a 21 gauge micropuncture needle and image documentation was performed.  The needle was exchanged over a guidewire for a peel-away sheath through which a 5 [place] dual lumen PICC trimmed to 39 cm was advanced, positioned with its tip at the lower SVC/right atrial junction.  Fluoroscopy during the procedure and fluoro spot radiograph confirms appropriate catheter position.  The catheter was flushed, secured to the skin with Prolene sutures, and covered with a sterile dressing.  Complications:  None  IMPRESSION: Successful right arm PICC line placement with ultrasound and fluoroscopic guidance.  The catheter is ready for use.   Original Report Authenticated By: Aletta Edouard, M.D.   Dg Chest Portable 1 View  01/19/2013   *RADIOLOGY REPORT*  Clinical Data: Status post central venous catheter placement  PORTABLE CHEST - 1 VIEW  Comparison: 01/19/2013  Findings: Left IJ catheter has been placed.  The tip is in the right atrium.  Heart size is normal.  No pleural effusion or edema. No airspace consolidation identified.  No pneumothorax identified.  IMPRESSION:  1.  Left IJ catheter tip is in the right atrium.   Original Report Authenticated By: Kerby Moors, M.D.   Dg Chest Portable 1 View  01/18/2013   *RADIOLOGY REPORT*  Clinical Data: Chest pain.  PORTABLE CHEST - 1 VIEW  Comparison: Chest x-ray 03/06/2012.  Findings: Lung volumes are normal.  No consolidative airspace disease.  No pleural effusions.  No pneumothorax.  No pulmonary nodule or mass noted.  Pulmonary vasculature and the cardiomediastinal silhouette are within normal limits.  IMPRESSION: 1. No radiographic evidence of acute cardiopulmonary disease.   Original Report Authenticated By: Vinnie Langton, M.D.   Dg Abd Acute W/chest  02/06/2013   *RADIOLOGY REPORT*  Clinical Data: Mid abdominal pain and vomiting for 24 hours.  ACUTE ABDOMEN SERIES (ABDOMEN 2 VIEW & CHEST 1 VIEW)  Comparison: Chest 01/19/2013.  Abdominal series 01/19/2013.  Findings: Slightly shallow inspiration. The heart size and pulmonary vascularity are normal. The lungs appear clear and expanded without focal air space disease or consolidation. No blunting of the costophrenic angles.  Central perihilar interstitial changes suggest chronic bronchitis.  No pneumothorax. Mediastinal contours appear intact.  Scattered gas and stool in the colon.  No small or large bowel distension.  Postoperative changes in the left abdomen consistent with bowel staples. Surgical clips in the right upper quadrant.  No free air in the abdomen.  No abnormal air fluid levels.  Calcified phleboliths in the pelvis.  No radiopaque stones.  Postoperative changes in the  lumbar spine.  IMPRESSION: No evidence of active pulmonary disease.  Probable chronic bronchitic changes.  Nonobstructive bowel gas pattern.  Original Report Authenticated By: Lucienne Capers, M.D.   Dg Abd Acute W/chest  01/19/2013   *RADIOLOGY REPORT*  Clinical Data: Abdominal pain.  Nausea and vomiting  ACUTE ABDOMEN SERIES (ABDOMEN 2 VIEW & CHEST 1 VIEW)  Comparison: 01/18/2013  Findings: Normal heart size.  No pleural effusion or edema identified.  Percutaneous gastrojejunostomy tube is identified. The bowel gas pattern appears nonobstructive.  There is enteric contrast material noted within the colon.  Cholecystectomy clips are present in the right upper quadrant.  IMPRESSION:  1.  Nonobstructive bowel gas pattern. 2.  Gastrojejunostomy tube in place.   Original Report Authenticated By: Kerby Moors, M.D.   Dg Abd Portable 1v  01/18/2013   *RADIOLOGY REPORT*  Clinical Data: Abdominal pain.  Emesis.  PORTABLE ABDOMEN - 1 VIEW  Comparison: Abdominal radiograph 02/25/2012.  Findings: A percutaneous gastrostomy tube is seen projecting over the left upper quadrant of the abdomen.  Some gas and stool are noted in the splenic flexure of the colon.  No pathologic distension of small bowel.  No gross pneumoperitoneum on this single-view examination.  Surgical clips project over the right upper quadrant of the abdomen, compatible with prior cholecystectomy.  Interbody spacers are noted in the lower lumbar spine.  IMPRESSION: 1.  Nonspecific, nonobstructive bowel gas pattern, as above.   Original Report Authenticated By: Vinnie Langton, M.D.    Microbiology: No results found for this or any previous visit (from the past 240 hour(s)).   Labs: Basic Metabolic Panel:  Recent Labs Lab 02/06/13 0210 02/07/13 0420 02/08/13 0155 02/09/13 0840 02/10/13 0400  NA 130* 137 132* 133* 134*  K 3.0* 3.0* 3.3* 3.9 4.0  CL 86* 100 101 102 102  CO2 25 26 23 26 26   GLUCOSE 402* 139* 100* 150* 196*  BUN 13 8 8  6 6   CREATININE 0.58 0.51 0.56 0.50 0.50  CALCIUM 10.7* 8.7 8.3* 8.7 8.5  MG 1.3*  --  1.7  --   --   PHOS 4.5  --   --   --   --    Liver Function Tests:  Recent Labs Lab 02/06/13 0210 02/07/13 0420  AST 15 11  ALT 11 6  ALKPHOS 149* 106  BILITOT 0.3 0.3  PROT 8.8* 6.7  ALBUMIN 3.3* 2.3*    Recent Labs Lab 02/06/13 0210  LIPASE 23   No results found for this basename: AMMONIA,  in the last 168 hours CBC:  Recent Labs Lab 02/06/13 0320 02/07/13 0420 02/08/13 0155 02/09/13 0840  WBC 14.1* 12.0* 8.0 6.8  NEUTROABS 12.2*  --   --   --   HGB 13.0 12.4 10.8* 10.8*  HCT 37.0 36.7 31.9* 32.5*  MCV 85.5 86.6 87.6 88.6  PLT 219 196 154 159   Cardiac Enzymes:  Recent Labs Lab 02/08/13 0155 02/08/13 0900  TROPONINI <0.30 <0.30   BNP: BNP (last 3 results) No results found for this basename: PROBNP,  in the last 8760 hours CBG:  Recent Labs Lab 02/09/13 1619 02/09/13 2005 02/09/13 2347 02/10/13 0410 02/10/13 0728  GLUCAP 225* 222* 316* 174* 157*       Signed:  Raeli Wiens  [facility] 02/10/2013, 8:40 AM

## 2013-02-10 NOTE — Progress Notes (Signed)
RUA PICC removed per order.  No signs of infection present. Site cleaned with CHG, covered with vaseline guaze and 4x4 dry drsg.  No signs of bleeding present. Verbalizes understanding of site care, signs of infection and to call doctor for any signs of bleeding or infection via teach back method.  No adn.

## 2013-02-12 ENCOUNTER — Emergency Department (HOSPITAL_COMMUNITY): Payer: Medicare Other

## 2013-02-12 ENCOUNTER — Encounter (HOSPITAL_COMMUNITY): Payer: Self-pay | Admitting: Emergency Medicine

## 2013-02-12 ENCOUNTER — Inpatient Hospital Stay (HOSPITAL_COMMUNITY)
Admission: EM | Admit: 2013-02-12 | Discharge: 2013-02-20 | DRG: 074 | Disposition: A | Discharge status: Other Acute Inpatient Hospital | Payer: Medicare Other | Attending: Internal Medicine | Admitting: Internal Medicine

## 2013-02-12 DIAGNOSIS — E1140 Type 2 diabetes mellitus with diabetic neuropathy, unspecified: Secondary | ICD-10-CM

## 2013-02-12 DIAGNOSIS — K3184 Gastroparesis: Secondary | ICD-10-CM

## 2013-02-12 DIAGNOSIS — K567 Ileus, unspecified: Secondary | ICD-10-CM

## 2013-02-12 DIAGNOSIS — E860 Dehydration: Secondary | ICD-10-CM

## 2013-02-12 DIAGNOSIS — F17200 Nicotine dependence, unspecified, uncomplicated: Secondary | ICD-10-CM | POA: Diagnosis present

## 2013-02-12 DIAGNOSIS — IMO0002 Reserved for concepts with insufficient information to code with codable children: Secondary | ICD-10-CM

## 2013-02-12 DIAGNOSIS — Z931 Gastrostomy status: Secondary | ICD-10-CM

## 2013-02-12 DIAGNOSIS — E1142 Type 2 diabetes mellitus with diabetic polyneuropathy: Secondary | ICD-10-CM | POA: Diagnosis present

## 2013-02-12 DIAGNOSIS — R4702 Dysphasia: Secondary | ICD-10-CM

## 2013-02-12 DIAGNOSIS — N390 Urinary tract infection, site not specified: Secondary | ICD-10-CM

## 2013-02-12 DIAGNOSIS — R739 Hyperglycemia, unspecified: Secondary | ICD-10-CM

## 2013-02-12 DIAGNOSIS — K9413 Enterostomy malfunction: Secondary | ICD-10-CM | POA: Diagnosis present

## 2013-02-12 DIAGNOSIS — E871 Hypo-osmolality and hyponatremia: Secondary | ICD-10-CM

## 2013-02-12 DIAGNOSIS — R1310 Dysphagia, unspecified: Secondary | ICD-10-CM | POA: Diagnosis present

## 2013-02-12 DIAGNOSIS — Z87898 Personal history of other specified conditions: Secondary | ICD-10-CM

## 2013-02-12 DIAGNOSIS — N179 Acute kidney failure, unspecified: Secondary | ICD-10-CM

## 2013-02-12 DIAGNOSIS — I10 Essential (primary) hypertension: Secondary | ICD-10-CM

## 2013-02-12 DIAGNOSIS — K9403 Colostomy malfunction: Secondary | ICD-10-CM | POA: Diagnosis present

## 2013-02-12 DIAGNOSIS — K219 Gastro-esophageal reflux disease without esophagitis: Secondary | ICD-10-CM

## 2013-02-12 DIAGNOSIS — I2510 Atherosclerotic heart disease of native coronary artery without angina pectoris: Secondary | ICD-10-CM | POA: Diagnosis present

## 2013-02-12 DIAGNOSIS — R109 Unspecified abdominal pain: Secondary | ICD-10-CM

## 2013-02-12 DIAGNOSIS — E785 Hyperlipidemia, unspecified: Secondary | ICD-10-CM

## 2013-02-12 DIAGNOSIS — K920 Hematemesis: Secondary | ICD-10-CM

## 2013-02-12 DIAGNOSIS — D72829 Elevated white blood cell count, unspecified: Secondary | ICD-10-CM

## 2013-02-12 DIAGNOSIS — D638 Anemia in other chronic diseases classified elsewhere: Secondary | ICD-10-CM | POA: Diagnosis present

## 2013-02-12 DIAGNOSIS — F1120 Opioid dependence, uncomplicated: Secondary | ICD-10-CM | POA: Diagnosis present

## 2013-02-12 DIAGNOSIS — G8929 Other chronic pain: Secondary | ICD-10-CM | POA: Diagnosis present

## 2013-02-12 DIAGNOSIS — R1013 Epigastric pain: Secondary | ICD-10-CM | POA: Diagnosis present

## 2013-02-12 DIAGNOSIS — E1165 Type 2 diabetes mellitus with hyperglycemia: Secondary | ICD-10-CM

## 2013-02-12 DIAGNOSIS — K5900 Constipation, unspecified: Secondary | ICD-10-CM

## 2013-02-12 DIAGNOSIS — E1149 Type 2 diabetes mellitus with other diabetic neurological complication: Principal | ICD-10-CM | POA: Diagnosis present

## 2013-02-12 DIAGNOSIS — E876 Hypokalemia: Secondary | ICD-10-CM

## 2013-02-12 DIAGNOSIS — R112 Nausea with vomiting, unspecified: Secondary | ICD-10-CM

## 2013-02-12 DIAGNOSIS — R1115 Cyclical vomiting syndrome unrelated to migraine: Secondary | ICD-10-CM | POA: Diagnosis present

## 2013-02-12 DIAGNOSIS — K220 Achalasia of cardia: Secondary | ICD-10-CM | POA: Diagnosis present

## 2013-02-12 LAB — URINALYSIS, ROUTINE W REFLEX MICROSCOPIC
Bilirubin Urine: NEGATIVE
Glucose, UA: >1000 mg/dL — AB
Specific Gravity, Urine: 1.021 (ref 1.005–1.030)
pH: 7.5 (ref 5.0–8.0)

## 2013-02-12 LAB — CBC WITH DIFFERENTIAL/PLATELET
Basophils Relative: 0 % (ref 0–1)
HCT: 39 % (ref 36.0–46.0)
Hemoglobin: 13.5 g/dL (ref 12.0–15.0)
Lymphocytes Relative: 18 % (ref 12–46)
Lymphs Abs: 1.8 10*3/uL (ref 0.7–4.0)
MCHC: 34.6 g/dL (ref 30.0–36.0)
Monocytes Absolute: 0.3 10*3/uL (ref 0.1–1.0)
Monocytes Relative: 3 % (ref 3–12)
Neutro Abs: 7.9 10*3/uL — ABNORMAL HIGH (ref 1.7–7.7)
RBC: 4.51 MIL/uL (ref 3.87–5.11)

## 2013-02-12 LAB — GLUCOSE, CAPILLARY
Glucose-Capillary: 337 mg/dL — ABNORMAL HIGH (ref 70–99)
Glucose-Capillary: 306 mg/dL — ABNORMAL HIGH (ref 70–99)
Glucose-Capillary: 271 mg/dL — ABNORMAL HIGH (ref 70–99)
Glucose-Capillary: 180 mg/dL — ABNORMAL HIGH (ref 70–99)
Glucose-Capillary: 132 mg/dL — ABNORMAL HIGH (ref 70–99)

## 2013-02-12 LAB — COMPREHENSIVE METABOLIC PANEL
ALT: 13 U/L (ref 0–35)
Calcium: 9.3 mg/dL (ref 8.4–10.5)
GFR calc Af Amer: >90 mL/min (ref >90)
Glucose, Bld: 408 mg/dL — ABNORMAL HIGH (ref 70–99)
Sodium: 129 mEq/L — ABNORMAL LOW (ref 135–145)
Total Protein: 7.5 g/dL (ref 6.0–8.3)

## 2013-02-12 LAB — LIPASE, BLOOD: Lipase: 25 U/L (ref 11–59)

## 2013-02-12 LAB — URINE MICROSCOPIC-ADD ON

## 2013-02-12 MED ORDER — IOHEXOL 300 MG/ML  SOLN
50.0000 mL | Freq: Once | INTRAMUSCULAR | Status: AC | PRN
  Administered 2013-02-12: 30 mL

## 2013-02-12 MED ORDER — OSMOLITE 1.2 CAL PO LIQD: 1000.0000 mL | ORAL | Status: DC

## 2013-02-12 MED ORDER — MOMETASONE FURO-FORMOTEROL FUM 100-5 MCG/ACT IN AERO
2.0000 | INHALATION_SPRAY | Freq: Two times a day (BID) | RESPIRATORY_TRACT | Status: DC
  Administered 2013-02-12 – 2013-02-20 (×17): 2 via RESPIRATORY_TRACT
  Filled 2013-02-12: qty 8.8

## 2013-02-12 MED ORDER — DEXTROSE 50 % IV SOLN: 25.0000 mL | Freq: Once | INTRAVENOUS | Status: AC | PRN

## 2013-02-12 MED ORDER — METOPROLOL TARTRATE 1 MG/ML IV SOLN
5.0000 mg | Freq: Three times a day (TID) | INTRAVENOUS | Status: DC | PRN
  Filled 2013-02-12: qty 5

## 2013-02-12 MED ORDER — METOCLOPRAMIDE HCL 5 MG/ML IJ SOLN
10.0000 mg | Freq: Once | INTRAMUSCULAR | Status: AC
  Administered 2013-02-12: 10 mg via INTRAVENOUS
  Filled 2013-02-12: qty 2

## 2013-02-12 MED ORDER — ONDANSETRON 8 MG/NS 50 ML IVPB
8.0000 mg | Freq: Once | INTRAVENOUS | Status: AC
  Administered 2013-02-12: 8 mg via INTRAVENOUS
  Filled 2013-02-12: qty 8

## 2013-02-12 MED ORDER — INSULIN ASPART 100 UNIT/ML ~~LOC~~ SOLN
0.0000 [IU] | SUBCUTANEOUS | Status: DC
  Administered 2013-02-12: 2 [IU] via SUBCUTANEOUS
  Administered 2013-02-13 (×5): 1 [IU] via SUBCUTANEOUS
  Administered 2013-02-14: 2 [IU] via SUBCUTANEOUS
  Administered 2013-02-15 (×2): 1 [IU] via SUBCUTANEOUS
  Administered 2013-02-15: 2 [IU] via SUBCUTANEOUS
  Administered 2013-02-15 – 2013-02-17 (×6): 1 [IU] via SUBCUTANEOUS

## 2013-02-12 MED ORDER — OXYCODONE HCL 5 MG PO TABS
5.0000 mg | ORAL_TABLET | Freq: Four times a day (QID) | ORAL | Status: DC | PRN
  Administered 2013-02-14 – 2013-02-15 (×4): 10 mg
  Filled 2013-02-12 (×4): qty 2

## 2013-02-12 MED ORDER — SODIUM CHLORIDE 0.9 % IV SOLN
INTRAVENOUS | Status: DC
  Administered 2013-02-12: 2.4 [IU]/h via INTRAVENOUS
  Filled 2013-02-12: qty 1

## 2013-02-12 MED ORDER — GABAPENTIN 300 MG PO CAPS
600.0000 mg | ORAL_CAPSULE | Freq: Three times a day (TID) | ORAL | Status: DC
  Administered 2013-02-12 – 2013-02-20 (×15): 600 mg
  Filled 2013-02-12 (×26): qty 2

## 2013-02-12 MED ORDER — SODIUM CHLORIDE 0.9 % IV BOLUS (SEPSIS)
1000.0000 mL | Freq: Once | INTRAVENOUS | Status: AC
  Administered 2013-02-12: 1000 mL via INTRAVENOUS

## 2013-02-12 MED ORDER — PROMETHAZINE HCL 25 MG PO TABS: 25.0000 mg | ORAL_TABLET | Freq: Four times a day (QID) | ORAL | Status: DC | PRN

## 2013-02-12 MED ORDER — ONDANSETRON HCL 4 MG/2ML IJ SOLN
4.0000 mg | Freq: Four times a day (QID) | INTRAMUSCULAR | Status: DC | PRN
  Administered 2013-02-12 – 2013-02-13 (×5): 4 mg via INTRAVENOUS
  Filled 2013-02-12 (×6): qty 2

## 2013-02-12 MED ORDER — HYDROMORPHONE HCL PF 1 MG/ML IJ SOLN
0.5000 mg | Freq: Once | INTRAMUSCULAR | Status: AC
  Administered 2013-02-12: 0.5 mg via INTRAVENOUS
  Filled 2013-02-12: qty 1

## 2013-02-12 MED ORDER — DEXTROSE 50 % IV SOLN
25.0000 mL | INTRAVENOUS | Status: DC | PRN
  Administered 2013-02-19 – 2013-02-20 (×5): 25 mL via INTRAVENOUS
  Filled 2013-02-12 (×4): qty 50

## 2013-02-12 MED ORDER — DEXTROSE 50 % IV SOLN: 50.0000 mL | Freq: Once | INTRAVENOUS | Status: AC | PRN

## 2013-02-12 MED ORDER — ONDANSETRON HCL 8 MG PO TABS: 8.0000 mg | ORAL_TABLET | Freq: Four times a day (QID) | ORAL | Status: DC | PRN

## 2013-02-12 MED ORDER — INSULIN REGULAR BOLUS VIA INFUSION
0.0000 [IU] | Freq: Three times a day (TID) | INTRAVENOUS | Status: DC
  Filled 2013-02-12: qty 10

## 2013-02-12 MED ORDER — PROMETHAZINE HCL 25 MG/ML IJ SOLN
25.0000 mg | Freq: Once | INTRAMUSCULAR | Status: AC
Start: 2013-02-12 — End: 2013-02-12
  Administered 2013-02-12: 25 mg via INTRAMUSCULAR
  Filled 2013-02-12: qty 1

## 2013-02-12 MED ORDER — DEXTROSE-NACL 5-0.45 % IV SOLN
INTRAVENOUS | Status: DC
  Administered 2013-02-12 – 2013-02-13 (×2): via INTRAVENOUS

## 2013-02-12 MED ORDER — POTASSIUM CHLORIDE 10 MEQ/100ML IV SOLN
10.0000 meq | INTRAVENOUS | Status: AC
  Administered 2013-02-12 (×5): 10 meq via INTRAVENOUS
  Filled 2013-02-12 (×5): qty 100

## 2013-02-12 MED ORDER — METOPROLOL SUCCINATE ER 50 MG PO TB24
50.0000 mg | ORAL_TABLET | Freq: Every day | ORAL | Status: DC
  Administered 2013-02-14 – 2013-02-19 (×6): 50 mg via ORAL
  Filled 2013-02-12 (×9): qty 1

## 2013-02-12 MED ORDER — PROMETHAZINE HCL 25 MG RE SUPP: 25.0000 mg | Freq: Four times a day (QID) | RECTAL | Status: DC | PRN

## 2013-02-12 MED ORDER — HYDROMORPHONE HCL PF 1 MG/ML IJ SOLN: 0.5000 mg | INTRAMUSCULAR | Status: DC | PRN

## 2013-02-12 MED ORDER — SUCRALFATE 1 GM/10ML PO SUSP
1.0000 g | Freq: Three times a day (TID) | ORAL | Status: DC
  Administered 2013-02-13 – 2013-02-20 (×17): 1 g via ORAL
  Filled 2013-02-12 (×35): qty 10

## 2013-02-12 MED ORDER — PROMETHAZINE HCL 25 MG/ML IJ SOLN
25.0000 mg | Freq: Four times a day (QID) | INTRAMUSCULAR | Status: DC | PRN
  Administered 2013-02-12 – 2013-02-20 (×23): 25 mg via INTRAVENOUS
  Filled 2013-02-12 (×23): qty 1

## 2013-02-12 MED ORDER — POTASSIUM CHLORIDE 20 MEQ/15ML (10%) PO LIQD
40.0000 meq | Freq: Three times a day (TID) | ORAL | Status: DC
  Administered 2013-02-12: 40 meq via ORAL
  Filled 2013-02-12 (×2): qty 30

## 2013-02-12 MED ORDER — PANTOPRAZOLE SODIUM 40 MG IV SOLR
40.0000 mg | Freq: Two times a day (BID) | INTRAVENOUS | Status: DC
  Administered 2013-02-12 – 2013-02-20 (×17): 40 mg via INTRAVENOUS
  Filled 2013-02-12 (×18): qty 40

## 2013-02-12 MED ORDER — SODIUM CHLORIDE 0.9 % IV SOLN
INTRAVENOUS | Status: DC
  Administered 2013-02-12: 14:00:00 via INTRAVENOUS

## 2013-02-12 MED ORDER — HYDROMORPHONE HCL PF 1 MG/ML IJ SOLN
1.0000 mg | INTRAMUSCULAR | Status: DC | PRN
  Administered 2013-02-12 – 2013-02-14 (×16): 1 mg via INTRAVENOUS
  Filled 2013-02-12 (×16): qty 1

## 2013-02-12 MED ORDER — METOCLOPRAMIDE HCL 10 MG PO TABS
10.0000 mg | ORAL_TABLET | Freq: Four times a day (QID) | ORAL | Status: DC
  Administered 2013-02-14 – 2013-02-16 (×8): 10 mg
  Filled 2013-02-12 (×17): qty 1

## 2013-02-12 MED ORDER — AMLODIPINE BESYLATE 10 MG PO TABS
10.0000 mg | ORAL_TABLET | Freq: Every day | ORAL | Status: DC
  Administered 2013-02-14 – 2013-02-20 (×7): 10 mg via ORAL
  Filled 2013-02-12 (×9): qty 1

## 2013-02-12 MED ORDER — PROMETHAZINE HCL 25 MG/ML IJ SOLN
25.0000 mg | Freq: Once | INTRAMUSCULAR | Status: AC
  Administered 2013-02-12: 25 mg via INTRAMUSCULAR
  Filled 2013-02-12: qty 1

## 2013-02-12 MED ORDER — FREE WATER
200.0000 mL | Freq: Four times a day (QID) | Status: AC
  Administered 2013-02-12: 200 mL

## 2013-02-12 MED ORDER — LORAZEPAM 2 MG/ML IJ SOLN
0.5000 mg | Freq: Once | INTRAMUSCULAR | Status: AC
  Administered 2013-02-12: 0.5 mg via INTRAVENOUS
  Filled 2013-02-12: qty 1

## 2013-02-12 MED ORDER — SODIUM CHLORIDE 0.9 % IV SOLN: INTRAVENOUS | Status: DC

## 2013-02-12 NOTE — H&P (Signed)
[facility] History and Physical  [name] [id number] DOB: 04/01/1971 DOA: 02/12/2013  Referring physician: Dr. Sabra Heck PCP: [name], MD  Specialists: none  Chief Complaint: Nausea an vomiting.  HPI: [name] is a 42 y.o. female  Past medical history of poorly controlled diabetes mellitus, hypertension, with diabetic gastroparesis with multiple admissions in the last 4 months intractable nausea and vomiting that comes in for nausea and vomiting for the past 2 days progressively getting worse. She relates she saw some very small streaks of blood in her vomit after 2 days of vomiting. She relates also abdominal pain in the epigastric area nothing makes it better or worse with no radiation no associated symptoms. She relates her last bowel movement was 2 days prior to admission. She relates any medications. She denies any fever chills diarrhea.  In the ED: Complete metabolic panel was done that showed she was hyponatremic hypokalemic hypochloremic with an elevated blood glucose of 400 a mild elevation in her alkaline phosphatase of 123 and albumin of 3.1. A CBC that showed a left shift of 7.9 white count 9.9 abdominal x-ray that showed no evidence of obstruction, with a UA with a blood glucose of greater than 1000 is elevated specific gravity and 3-6 white cells and a few bacteria so we are consulted for further evaluation.   Review of Systems: The patient denies anorexia, fever, weight loss,, vision loss, decreased hearing, hoarseness, chest pain, syncope, dyspnea on exertion, peripheral edema, balance deficits, hemoptysis, melena, hematochezia,  hematuria, incontinence, genital sores, muscle weakness, suspicious skin lesions, transient blindness, difficulty walking, depression, unusual weight change, abnormal bleeding, enlarged lymph nodes, angioedema, and breast masses.    Past Medical History  Diagnosis Date  . Hypertension   . Diabetes mellitus   . Gastroparesis   .  Asthma   . GERD (gastroesophageal reflux disease)   . Coronary artery disease   . h/o seizure   . Neuropathy   . Chronic pancreatitis   . Dyslipidemia   . MI (myocardial infarction)   . COPD (chronic obstructive pulmonary disease)   . Seizures    Past Surgical History  Procedure Laterality Date  . Abdominal hysterectomy    . Cholecystectomy    . Peg tube x 4      feeding jejunostomies with PEG tubes  . Cesarean section    . Colon resection due to diverticulitis    . Back surgery    . Esophagogastroduodenoscopy N/A 12/27/2012    Procedure: ESOPHAGOGASTRODUODENOSCOPY (EGD);  Surgeon: Missy Sabins, MD;  Location: [facility];  Service: Endoscopy;  Laterality: N/A;   Social History:  reports that she has been smoking Cigarettes.  She has a .5 pack-year smoking history. She has never used smokeless tobacco. She reports that she does not drink alcohol or use illicit drugs.   Allergies  Allergen Reactions  . Penicillins Hives  . Cefadroxil     Not sure of effects-listed as allergy on list from home  . Compazine Hives    Tolerates promethazine  . Darvocet (Propoxyphene-Acetaminophen) Other (See Comments)    UNKNOWN  . Nsaids Hives  . Zofran (Ondansetron) Hives    Family History  Problem Relation Age of Onset  . Cancer Mother     Prior to Admission medications   Medication Sig Start Date End Date Taking? Authorizing Provider  amLODipine (NORVASC) 10 MG tablet Take 1 tablet (10 mg total) by mouth daily. 01/22/13   Oswald Hillock, MD  atorvastatin (LIPITOR) 40 MG  tablet Take 1 tablet (40 mg total) by mouth daily at 6 PM. 12/29/12   Belkys A Regalado, MD  docusate sodium (COLACE) 100 MG capsule Take 100 mg by mouth 2 (two) times daily.    Historical Provider, MD  esomeprazole (NEXIUM) 40 MG capsule Take 1 capsule (40 mg total) by mouth 2 (two) times daily. 12/29/12   Belkys A Regalado, MD  Fluticasone-Salmeterol (ADVAIR) 250-50 MCG/DOSE AEPB Inhale 1 puff into the lungs 2 (two) times  daily. 12/02/11   Robbie Lis, MD  gabapentin (NEURONTIN) 300 MG capsule Take 2 capsules (600 mg total) by mouth 3 (three) times daily. 12/29/12   Belkys A Regalado, MD  insulin glargine (LANTUS) 100 UNIT/ML injection Inject 0.1 mLs (10 Units total) into the skin at bedtime. 12/29/12   Belkys A Regalado, MD  insulin lispro (HUMALOG) 100 UNIT/ML injection Inject 10-20 Units into the skin 3 (three) times daily before meals. Sliding scale 12/02/11   Robbie Lis, MD  metoCLOPramide (REGLAN) 10 MG tablet Take 10 mg by mouth 4 (four) times daily.    Historical Provider, MD  metoprolol succinate (TOPROL XL) 50 MG 24 hr tablet Take 1 tablet (50 mg total) by mouth daily. Take with or immediately following a meal. 12/29/12   Belkys A Regalado, MD  Multiple Vitamin (MULITIVITAMIN WITH MINERALS) TABS Take 1 tablet by mouth daily. 12/02/11   Robbie Lis, MD  Nutritional Supplements (FEEDING SUPPLEMENT, OSMOLITE 1.2 CAL,) LIQD Place 1,000 mLs into feeding tube continuous. Rate:76ml/hr    Historical Provider, MD  oxyCODONE (OXY IR/ROXICODONE) 5 MG immediate release tablet Take 1-2 tablets (5-10 mg total) by mouth every 6 (six) hours as needed. 02/10/13   Belkys A Regalado, MD  promethazine (PHENERGAN) 25 MG tablet Take 1 tablet (25 mg total) by mouth every 6 (six) hours as needed for nausea. [id number]   Delora Fuel, MD  sucralfate (CARAFATE) 1 GM/10ML suspension Take 10 mLs (1 g total) by mouth 4 (four) times daily -  with meals and at bedtime. 02/10/13   Belkys A Regalado, MD  zolpidem (AMBIEN) 10 MG tablet Take 10 mg by mouth at bedtime as needed for sleep.    Historical Provider, MD   Physical Exam: Filed Vitals:   02/12/13 0737 02/12/13 1035  BP: 199/110 158/112  Pulse: 139 133  Temp: 98.6 F (37 C) 98.9 F (37.2 C)  TempSrc: Oral Oral  Resp: 22 16  SpO2: 100% 100%    BP 158/112  Pulse 133  Temp(Src) 98.9 F (37.2 C) (Oral)  Resp 16  SpO2 100%  General Appearance:    Alert, cooperative, no distress,  appears stated age  Head:    Normocephalic, without obvious abnormality, atraumatic           Throat:   Lips, mucosa, and tongue dry  Neck:   Supple, symmetrical, trachea midline, no adenopathy;    thyroid:  no enlargement/tenderness/nodules; no carotid   bruit or JVD  Back:     Symmetric, no curvature, ROM normal, no CVA tenderness  Lungs:     Clear to auscultation bilaterally, respirations unlabored  Chest Wall:    No tenderness or deformity   Heart:    Regular rate and rhythm, S1 and S2 normal, no murmur, rub   or gallop     Abdomen:     Soft, mild line scar, tender in the epigastric no rebound, Murphy sign negative.         Extremities:   Extremities normal,  atraumatic, no cyanosis or edema  Pulses:   2+ and symmetric all extremities  Skin:   Skin color, texture, turgor normal, no rashes or lesions  Lymph nodes:   Cervical, supraclavicular, and axillary nodes normal  Neurologic:   CNII-XII intact, normal strength, sensation and reflexes    throughout     Labs on Admission:  Basic Metabolic Panel:  Recent Labs Lab 02/06/13 0210 02/07/13 0420 02/08/13 0155 02/09/13 0840 02/10/13 0400 02/12/13 0818  NA 130* 137 132* 133* 134* 129*  K 3.0* 3.0* 3.3* 3.9 4.0 3.4*  CL 86* 100 101 102 102 92*  CO2 25 26 23 26 26 24   GLUCOSE 402* 139* 100* 150* 196* 408*  BUN 13 8 8 6 6 10   CREATININE 0.58 0.51 0.56 0.50 0.50 0.46*  CALCIUM 10.7* 8.7 8.3* 8.7 8.5 9.3  MG 1.3*  --  1.7  --   --   --   PHOS 4.5  --   --   --   --   --    Liver Function Tests:  Recent Labs Lab 02/06/13 0210 02/07/13 0420 02/12/13 0818  AST 15 11 16   ALT 11 6 13   ALKPHOS 149* 106 123*  BILITOT 0.3 0.3 0.2*  PROT 8.8* 6.7 7.5  ALBUMIN 3.3* 2.3* 3.1*    Recent Labs Lab 02/06/13 0210 02/12/13 0818  LIPASE 23 25   No results found for this basename: AMMONIA,  in the last 168 hours CBC:  Recent Labs Lab 02/06/13 0320 02/07/13 0420 02/08/13 0155 02/09/13 0840 02/12/13 0818  WBC 14.1*  12.0* 8.0 6.8 9.9  NEUTROABS 12.2*  --   --   --  7.9*  HGB 13.0 12.4 10.8* 10.8* 13.5  HCT 37.0 36.7 31.9* 32.5* 39.0  MCV 85.5 86.6 87.6 88.6 86.5  PLT 219 196 154 159 213   Cardiac Enzymes:  Recent Labs Lab 02/08/13 0155 02/08/13 0900  TROPONINI <0.30 <0.30    BNP (last 3 results) No results found for this basename: PROBNP,  in the last 8760 hours CBG:  Recent Labs Lab 02/09/13 2347 02/10/13 0410 02/10/13 0728 02/12/13 0812 02/12/13 1005  GLUCAP 316* 174* 157* 337* 306*    Radiological Exams on Admission: Dg Abd Acute W/chest  02/12/2013   *RADIOLOGY REPORT*  Clinical Data: Nausea and vomiting with distention.  ACUTE ABDOMEN SERIES (ABDOMEN 2 VIEW & CHEST 1 VIEW)  Comparison: None.  Findings: Normal heart size with clear lung fields.  No bony abnormality.  No free air.  Jejunal tube has been injected with dilute contrast and fills the proximal jejunum.  There is no bowel obstruction.  Surgical clips right upper quadrant. Contrast material from a separate procedure opacifies the colon.  Osseous structures unremarkable.  Prior lumbar fusion.  IMPRESSION: No evidence for bowel obstruction.  No active cardiopulmonary disease.  Jejunal tube appears positioned within the proximal small bowel.   Original Report Authenticated By: Rolla Flatten, M.D.    EKG: Independently reviewed. pending  Assessment/Plan Intractable nausea and vomiting/Abdominal pain: - This seems to be most likely an exacerbation of her gastroparesis. She is currently on narcotics which obviously could exacerbate her nausea and vomiting. I will go ahead and start her on IV Protonix, Zofran and Phenergan. We'll check an EKG to measure her QTc interval he try to use Zofran at the beginning if no improvement once her electrolytes are corrected can begin to use Phenergan. To minimize her narcotics. Abdominal x-ray doesn't show any perforation. Her LFTs are within  normal limits, her alkaline phosphatase is elevated  mostly secondary to her nausea and vomiting.  - Also her blood glucose is elevated which could be contributing and to her nausea and vomiting.  - She relates some mild hematemesis, hold heparin we'll use SCDs. Her hemoglobin currently is above normal. She might be slightly hemoconcentrated, so we'll continue to monitor and current hemoglobin.  - Don't see any signs of infections on her chest x-ray or UA. She only has a mild left shift on her CBC she could be stress emargination. Check blood cultures.  - I will treat conservatively for now with IV fluids narcotics and nausea medication and reevaluate in the morning.  Hypertension - Her blood pressure seems to be high this could be a vasovagal response, she seems to be intravascularly depleted we'll hold her blood pressure medications for now.  DM (diabetes mellitus), type 2, uncontrolled - Her blood glucose is greater than 400 we'll go ahead and start him a glucose stabilizer. Once her blood glucose are within normal range can start long-acting insulin for sliding scale insulin.  Hypokalemia: - Repeat check mag is likely secondary to nausea and vomiting.  Hyponatremia - This most likely secondary to decreased intravascular volume, continue IV fluids. We'll also give free water to PEG.  Leukocytosis, unspecified - Most likely stress emargination, she has remained afebrile. - Check a CBC in a.m.  Code Status: full Family Communication: none  Disposition Plan: inpatient  Time spent: 90 minutes  Charlynne Cousins [facility] Pager [phone]  If 7PM-7AM, please contact night-coverage [email or web address] Password [facility] 02/12/2013, 12:01 PM

## 2013-02-12 NOTE — Progress Notes (Signed)
Patient does not want to sign up for My Chart. Lucius Conn BSN, RN-BC Admissions RN  02/12/2013 11:47 AM

## 2013-02-12 NOTE — ED Notes (Signed)
[id number] Expected date:<BR> Expected time:<BR> Means of arrival:<BR> Comments:<BR> EMS/42 yo female with abdominal pain-hx pancreatitis and gastroparesis

## 2013-02-12 NOTE — Progress Notes (Signed)
Pt. Was ordered continuous tube feedings and medications to be given through her J-tube.  Medications were attempted to be given through J-tube when meds were given pt. Stated that she felt like she was going to be sick and the medication contents were then expelled back through the syringe in which they were being administered.  MD was notified and new orders were given to hold all PO medications and tube feedings. Will continue to monitor.

## 2013-02-12 NOTE — ED Notes (Signed)
Patient aware sample is needed for urine. Patient unable to void at this time.

## 2013-02-12 NOTE — ED Notes (Signed)
Per EMS: Pt states that she was released 2 days ago after being admitted for a week.  C/o pain since yesterday in her LLQ and LUQ.  Pt actively vomiting.  Hx of pancreatitis/gastroparesis.

## 2013-02-12 NOTE — ED Provider Notes (Signed)
History    CSN: [id number] Arrival date & time 02/12/13  [id number]  First MD Initiated Contact with Patient 02/12/13 [phone]     Chief Complaint  Patient presents with  . Abdominal Pain  . Nausea  . Emesis   (Consider location/radiation/quality/duration/timing/severity/associated sxs/prior Treatment) HPI Comments: 42 y/o female with hx of diabetic gastroparesis - presents with recurrent n/v and abd pain - she was recently and is frequently admitted to the hospital for same.  She recently had dislodgement of her Jejunal feeding tube into the stomach - had replace by IR during last week stay.  She improved and was d/c home.  She has prior small bowel resection 2/2 bowel obstruction.  She denies distention of her abd.  She has some hematemsis ass'd with n/v.  She denies fevers, swelling, diarrhea, has had decreased UOP and is too nauseated to take oral meds at home.  Sx are severe, persistent, nothing makes better or worse.   Patient is a 42 y.o. female presenting with abdominal pain and vomiting. The history is provided by the patient and medical records. The history is limited by a language barrier.  Abdominal Pain Associated symptoms include abdominal pain.  Emesis Associated symptoms: abdominal pain    Past Medical History  Diagnosis Date  . Hypertension   . Diabetes mellitus   . Gastroparesis   . Asthma   . GERD (gastroesophageal reflux disease)   . Coronary artery disease   . h/o seizure   . Neuropathy   . Chronic pancreatitis   . Dyslipidemia   . MI (myocardial infarction)   . COPD (chronic obstructive pulmonary disease)   . Seizures    Past Surgical History  Procedure Laterality Date  . Abdominal hysterectomy    . Cholecystectomy    . Peg tube x 4      feeding jejunostomies with PEG tubes  . Cesarean section    . Colon resection due to diverticulitis    . Back surgery    . Esophagogastroduodenoscopy N/A 12/27/2012    Procedure: ESOPHAGOGASTRODUODENOSCOPY (EGD);  Surgeon:  Missy Sabins, MD;  Location: [facility];  Service: Endoscopy;  Laterality: N/A;   Family History  Problem Relation Age of Onset  . Cancer Mother    History  Substance Use Topics  . Smoking status: Current Every Day Smoker -- 0.25 packs/day for 2 years    Types: Cigarettes  . Smokeless tobacco: Never Used  . Alcohol Use: No     Comment: hx 1 beer daily -pt states she quit Dec. 2012   OB History   Grav Para Term Preterm Abortions TAB SAB Ect Mult Living                 Review of Systems  Gastrointestinal: Positive for vomiting and abdominal pain.  All other systems reviewed and are negative.    Allergies  Penicillins; Cefadroxil; Compazine; Darvocet; Nsaids; and Zofran  Home Medications   Current Outpatient Rx  Name  Route  Sig  Dispense  Refill  . amLODipine (NORVASC) 10 MG tablet   Oral   Take 1 tablet (10 mg total) by mouth daily.   30 tablet   2   . atorvastatin (LIPITOR) 40 MG tablet   Oral   Take 1 tablet (40 mg total) by mouth daily at 6 PM.   30 tablet   0   . docusate sodium (COLACE) 100 MG capsule   Oral   Take 100 mg by mouth 2 (two) times  daily.         . esomeprazole (NEXIUM) 40 MG capsule   Oral   Take 1 capsule (40 mg total) by mouth 2 (two) times daily.   30 capsule   0   . Fluticasone-Salmeterol (ADVAIR) 250-50 MCG/DOSE AEPB   Inhalation   Inhale 1 puff into the lungs 2 (two) times daily.   60 each   0   . gabapentin (NEURONTIN) 300 MG capsule   Oral   Take 2 capsules (600 mg total) by mouth 3 (three) times daily.   30 capsule   0   . insulin glargine (LANTUS) 100 UNIT/ML injection   Subcutaneous   Inject 0.1 mLs (10 Units total) into the skin at bedtime.   10 mL   12   . insulin lispro (HUMALOG) 100 UNIT/ML injection   Subcutaneous   Inject 10-20 Units into the skin 3 (three) times daily before meals. Sliding scale   10 mL   2   . metoCLOPramide (REGLAN) 10 MG tablet   Oral   Take 10 mg by mouth 4 (four) times  daily.         . metoprolol succinate (TOPROL XL) 50 MG 24 hr tablet   Oral   Take 1 tablet (50 mg total) by mouth daily. Take with or immediately following a meal.   30 tablet   0   . Multiple Vitamin (MULITIVITAMIN WITH MINERALS) TABS   Oral   Take 1 tablet by mouth daily.   30 tablet   0   . Nutritional Supplements (FEEDING SUPPLEMENT, OSMOLITE 1.2 CAL,) LIQD   Per Tube   Place 1,000 mLs into feeding tube continuous. Rate:64ml/hr         . oxyCODONE (OXY IR/ROXICODONE) 5 MG immediate release tablet   Oral   Take 1-2 tablets (5-10 mg total) by mouth every 6 (six) hours as needed.   15 tablet   0   . promethazine (PHENERGAN) 25 MG tablet   Oral   Take 1 tablet (25 mg total) by mouth every 6 (six) hours as needed for nausea.   30 tablet   0   . sucralfate (CARAFATE) 1 GM/10ML suspension   Oral   Take 10 mLs (1 g total) by mouth 4 (four) times daily -  with meals and at bedtime.   420 mL   0   . zolpidem (AMBIEN) 10 MG tablet   Oral   Take 10 mg by mouth at bedtime as needed for sleep.          BP 158/112  Pulse 133  Temp(Src) 98.9 F (37.2 C) (Oral)  Resp 16  SpO2 100% Physical Exam  Nursing note and vitals reviewed. Constitutional: She appears well-developed and well-nourished. She appears distressed.  HENT:  Head: Normocephalic and atraumatic.  Mouth/Throat: Oropharynx is clear and moist. No oropharyngeal exudate.  Eyes: Conjunctivae and EOM are normal. Pupils are equal, round, and reactive to light. Right eye exhibits no discharge. Left eye exhibits no discharge. No scleral icterus.  Neck: Normal range of motion. Neck supple. No JVD present. No thyromegaly present.  Cardiovascular: Regular rhythm, normal heart sounds and intact distal pulses.  Exam reveals no gallop and no friction rub.   No murmur heard. tachy  Pulmonary/Chest: Breath sounds normal. No respiratory distress. She has no wheezes. She has no rales.  Mild tachypnea  Abdominal: Soft.  Bowel sounds are normal. She exhibits no distension and no mass. There is tenderness ( diffuse mild  to moderate ttp, soft, no peritoneal signs, no masses, J tube to skin in place without d/c or redness.  normal BS).  Musculoskeletal: Normal range of motion. She exhibits no edema and no tenderness.  Lymphadenopathy:    She has no cervical adenopathy.  Neurological: She is alert. Coordination normal.  Skin: Skin is warm and dry. No rash noted. No erythema.  Psychiatric: She has a normal mood and affect. Her behavior is normal.    ED Course  Procedures (including critical care time) Labs Reviewed  COMPREHENSIVE METABOLIC PANEL - Abnormal; Notable for the following:    Sodium 129 (*)    Potassium 3.4 (*)    Chloride 92 (*)    Glucose, Bld 408 (*)    Creatinine, Ser 0.46 (*)    Albumin 3.1 (*)    Alkaline Phosphatase 123 (*)    Total Bilirubin 0.2 (*)    All other components within normal limits  CBC WITH DIFFERENTIAL - Abnormal; Notable for the following:    Neutrophils Relative % 80 (*)    Neutro Abs 7.9 (*)    All other components within normal limits  URINALYSIS, ROUTINE W REFLEX MICROSCOPIC - Abnormal; Notable for the following:    APPearance CLOUDY (*)    Glucose, UA >1000 (*)    Hgb urine dipstick SMALL (*)    Protein, ur >300 (*)    All other components within normal limits  GLUCOSE, CAPILLARY - Abnormal; Notable for the following:    Glucose-Capillary 337 (*)    All other components within normal limits  GLUCOSE, CAPILLARY - Abnormal; Notable for the following:    Glucose-Capillary 306 (*)    All other components within normal limits  URINE MICROSCOPIC-ADD ON - Abnormal; Notable for the following:    Squamous Epithelial / LPF FEW (*)    Bacteria, UA FEW (*)    All other components within normal limits  URINE CULTURE  LIPASE, BLOOD   Dg Abd Acute W/chest  02/12/2013   *RADIOLOGY REPORT*  Clinical Data: Nausea and vomiting with distention.  ACUTE ABDOMEN SERIES  (ABDOMEN 2 VIEW & CHEST 1 VIEW)  Comparison: None.  Findings: Normal heart size with clear lung fields.  No bony abnormality.  No free air.  Jejunal tube has been injected with dilute contrast and fills the proximal jejunum.  There is no bowel obstruction.  Surgical clips right upper quadrant. Contrast material from a separate procedure opacifies the colon.  Osseous structures unremarkable.  Prior lumbar fusion.  IMPRESSION: No evidence for bowel obstruction.  No active cardiopulmonary disease.  Jejunal tube appears positioned within the proximal small bowel.   Original Report Authenticated By: Rolla Flatten, M.D.   1. Nausea and vomiting   2. Hyperglycemia     MDM  Pt appears uncomfortable, has possible SBO, more likely gastroparesis, consider DM complication / DKA etc. Recurrent gastroparesis and now having ongoing BRB in emesis - may need endoscopy if continues,  No fever of hypoxia, no rales but with mild tachypnea, due to pain / nausea.  Imaging pending, confirm J tube placement, r/o SBO, symptomatic meds UA to gauge hyudration, fluids recheck.  10:00 AM, patient reevaluated, she has ongoing nausea and pain, requesting more medication. At this time she has a normal mental status, states that she has improved but is still having symptoms.  Laboratory workup shows hyponatremia at 129, hyperglycemia 408, normal renal function, normal blood counts and an acute abdominal series shows that the patient's feeding tube is in the  small bowel appropriately. There is no sign of bowel instructions.  We'll redoes Phenergan and Dilaudid, the patient states that she needs more medication at this time.  The patient reexamined, has ongoing symptoms, will require admission to the hospital.  Johnna Acosta, MD 02/12/13 1144

## 2013-02-13 LAB — GLUCOSE, CAPILLARY
Glucose-Capillary: 126 mg/dL — ABNORMAL HIGH (ref 70–99)
Glucose-Capillary: 128 mg/dL — ABNORMAL HIGH (ref 70–99)
Glucose-Capillary: 155 mg/dL — ABNORMAL HIGH (ref 70–99)
Glucose-Capillary: 139 mg/dL — ABNORMAL HIGH (ref 70–99)

## 2013-02-13 LAB — COMPREHENSIVE METABOLIC PANEL
ALT: 10 U/L (ref 0–35)
AST: 16 U/L (ref 0–37)
CO2: 25 mEq/L (ref 19–32)
Calcium: 8.9 mg/dL (ref 8.4–10.5)
Creatinine, Ser: 0.81 mg/dL (ref 0.50–1.10)
GFR calc non Af Amer: 88 mL/min — ABNORMAL LOW (ref >90)
Sodium: 131 mEq/L — ABNORMAL LOW (ref 135–145)
Total Protein: 6.5 g/dL (ref 6.0–8.3)

## 2013-02-13 LAB — CBC
HCT: 34.2 % — ABNORMAL LOW (ref 36.0–46.0)
Hemoglobin: 11.7 g/dL — ABNORMAL LOW (ref 12.0–15.0)
MCHC: 34.2 g/dL (ref 30.0–36.0)
RBC: 3.9 MIL/uL (ref 3.87–5.11)

## 2013-02-13 LAB — PROTIME-INR
INR: 0.89 (ref 0.00–1.49)
Prothrombin Time: 11.9 seconds (ref 11.6–15.2)

## 2013-02-13 LAB — TSH: TSH: 0.569 u[IU]/mL (ref 0.350–4.500)

## 2013-02-13 LAB — URINE CULTURE: Colony Count: 30000

## 2013-02-13 MED ORDER — LORAZEPAM 2 MG/ML IJ SOLN
0.5000 mg | Freq: Four times a day (QID) | INTRAMUSCULAR | Status: DC | PRN
  Administered 2013-02-13 – 2013-02-20 (×17): 0.5 mg via INTRAVENOUS
  Filled 2013-02-13 (×17): qty 1

## 2013-02-13 MED ORDER — INSULIN GLARGINE 100 UNIT/ML ~~LOC~~ SOLN
5.0000 [IU] | Freq: Every day | SUBCUTANEOUS | Status: DC
  Administered 2013-02-13: 5 [IU] via SUBCUTANEOUS
  Filled 2013-02-13: qty 0.05

## 2013-02-13 MED ORDER — PROMETHAZINE HCL 25 MG PO TABS
25.0000 mg | ORAL_TABLET | Freq: Four times a day (QID) | ORAL | Status: DC | PRN
Start: 2013-02-13 — End: 2013-02-14

## 2013-02-13 NOTE — Progress Notes (Signed)
INITIAL NUTRITION ASSESSMENT  DOCUMENTATION CODES Per approved criteria  -Not Applicable   INTERVENTION: - Antiemetics per MD - Diet advancement per MD - Recommend GI consult  - Recommend, once pt ready to start TF again, initiation of TF of Osmolite 1.2 via J tube start at 2ml/hr increase by 60ml every 8 hours to goal of 73ml/hr. This will provide 1440 calories, 67g protein, 930ml free water meeting 99% estimated calorie needs, 112% estimated protein needs - Will continue to monitor   NUTRITION DIAGNOSIS: Inadequate oral intake related to inability to eat as evidenced by NPO.   Goal: 1. No further nausea/vomiting 2. Resume home TF regimen, advancing slowly to goal as tolerated 3. Advance diet as tolerated to diabetic diet  Monitor:  Weights, labs, diet advancement, nausea/vomiting, TF initiation/advancement/tolerance  Reason for Assessment: Nutrition risk, home TF  42 y.o. female  Admitting Dx: Nausea, vomiting, and abdominal discomfort  ASSESSMENT: Pt known to RD from previous admissions. Pt with history of diabetes, HTN, GERD, and diabetic gastroparesis. Pt asleep during visit. Pt has jejunostomy tube which was repositioned during admission earlier this month. Pt had reported during last admission that she uses at night, 3 cans of Isosource TF. This provides 900 calories, 40g protein, 653ml free water (meets 62% estimated calorie needs, 67% estimated protein needs). During last admission pt was on Osmolite 1.2 at 2ml/hr. This will provide 1440 calories, 67g protein, 958ml free water meeting 99% estimated calorie needs, 112% estimated protein needs.   Per conversation with RN, pt reportedly had projectile vomit from J tube last night and TF was held. RN checked residual from J tube this morning and noted 5-97ml clear drainage. Noted pt's weight down 7 pounds since last month. Noted pt with low sodium, slightly elevated blood sugar, and low magnesium.    Height: Ht Readings  from Last 1 Encounters:  02/12/13 5\' 5"  (1.651 m)    Weight: Wt Readings from Last 1 Encounters:  02/13/13 138 lb 14.4 oz (63.005 kg)    Ideal Body Weight: 125 lb  % Ideal Body Weight: 110%  Wt Readings from Last 10 Encounters:  02/13/13 138 lb 14.4 oz (63.005 kg)  02/06/13 141 lb 1.5 oz (64 kg)  01/19/13 145 lb 4.5 oz (65.9 kg)  12/26/12 152 lb (68.947 kg)  12/26/12 152 lb (68.947 kg)  03/14/12 128 lb 1.4 oz (58.1 kg)  03/14/12 128 lb 1.4 oz (58.1 kg)  03/14/12 128 lb 1.4 oz (58.1 kg)  03/14/12 128 lb 1.4 oz (58.1 kg)  03/14/12 128 lb 1.4 oz (58.1 kg)    Usual Body Weight: 152 lb May 2014  % Usual Body Weight: 91%  BMI:  Body mass index is 23.11 kg/(m^2).  Estimated Nutritional Needs: Kcal: 1450-1650 Protein: 60-70g Fluid: 1.4-1.6L/day  Skin: Intact  Diet Order: NPO  EDUCATION NEEDS: -No education needs identified at this time   Intake/Output Summary (Last 24 hours) at 02/13/13 1518 Last data filed at 02/13/13 1400  Gross per 24 hour  Intake 660.83 ml  Output   1000 ml  Net -339.17 ml    Last BM: 7/14  Labs:   Recent Labs Lab 02/08/13 0155  02/10/13 0400 02/12/13 0818 02/12/13 1420 02/13/13 0200  NA 132*  < > 134* 129*  --  131*  K 3.3*  < > 4.0 3.4*  --  4.3  CL 101  < > 102 92*  --  97  CO2 23  < > 26 24  --  25  BUN 8  < > 6 10  --  11  CREATININE 0.56  < > 0.50 0.46*  --  0.81  CALCIUM 8.3*  < > 8.5 9.3  --  8.9  MG 1.7  --   --   --  1.2*  --   GLUCOSE 100*  < > 196* 408*  --  143*  < > = values in this interval not displayed.  CBG (last 3)   Recent Labs  02/13/13 0001 02/13/13 0412 02/13/13 1149  GLUCAP 146* 126* 128*    Scheduled Meds: . amLODipine  10 mg Oral Daily  . free water  200 mL Per Tube Q6H  . gabapentin  600 mg Per Tube TID  . insulin aspart  0-9 Units Subcutaneous Q4H  . metoCLOPramide  10 mg Per Tube QID  . metoprolol succinate  50 mg Oral Daily  . mometasone-formoterol  2 puff Inhalation BID  .  pantoprazole (PROTONIX) IV  40 mg Intravenous Q12H  . sucralfate  1 g Oral TID WC & HS    Continuous Infusions: . dextrose 5 % and 0.45% NaCl 50 mL/hr at 02/13/13 0756  . feeding supplement (OSMOLITE 1.2 CAL) Stopped (02/12/13 1525)    Past Medical History  Diagnosis Date  . Hypertension   . Diabetes mellitus   . Gastroparesis   . Asthma   . GERD (gastroesophageal reflux disease)   . Coronary artery disease   . h/o seizure   . Neuropathy   . Chronic pancreatitis   . Dyslipidemia   . MI (myocardial infarction)   . COPD (chronic obstructive pulmonary disease)   . Seizures     Past Surgical History  Procedure Laterality Date  . Abdominal hysterectomy    . Cholecystectomy    . Peg tube x 4      feeding jejunostomies with PEG tubes  . Cesarean section    . Colon resection due to diverticulitis    . Back surgery    . Esophagogastroduodenoscopy N/A 12/27/2012    Procedure: ESOPHAGOGASTRODUODENOSCOPY (EGD);  Surgeon: Missy Sabins, MD;  Location: [facility];  Service: Endoscopy;  Laterality: N/A;     Mikey College MS, [place], [place] Pager [phone] After Hours Pager

## 2013-02-13 NOTE — Care Management Note (Signed)
CARE MANAGEMENT NOTE 02/13/2013  Patient:  KAIRA, HAMITER   Account Number:  [id number]  Date Initiated:  02/13/2013  Documentation initiated by:  Lakima Dona  Subjective/Objective Assessment:   42 yo female admitted with intactable N/V. PTA pt from home. PCP: [name], MD     Action/Plan:   Home when stable   Anticipated DC Date:     Anticipated DC Plan:  [place]  CM consult      Choice offered to / List presented to:  NA   DME arranged  NA      DME agency  NA     [place] arranged  NA      [place] agency  NA   Status of service:  In process, will continue to follow Medicare Important Message given?   (If response is "NO", the following Medicare IM given date fields will be blank) Date Medicare IM given:   Date Additional Medicare IM given:    Discharge Disposition:    Per UR Regulation:  Reviewed for med. necessity/level of care/duration of stay  If discussed at [place] of Stay Meetings, dates discussed:    Comments:  02/13/13 [place] Chart reviewed for utilization of services. No needs identified at this time.

## 2013-02-13 NOTE — Progress Notes (Signed)
Inpatient Diabetes Program Recommendations  AACE/ADA: New Consensus Statement on Inpatient Glycemic Control (2013)  Target Ranges:  Prepandial:   less than 140 mg/dL      Peak postprandial:   less than 180 mg/dL (1-2 hours)      Critically ill patients:  140 - 180 mg/dL   Reason for Visit: Hyperglycemia   Results for SHELLANE, HOBBS (MRN [id number]) as of 02/13/2013 17:22  Ref. Range 02/06/2013 03:20  Hemoglobin A1C Latest Range: <5.7 % 8.9 (H)  Results for MICHAELIA, PERMANN (MRN [id number]) as of 02/13/2013 17:22  Ref. Range 02/12/2013 08:12 02/12/2013 10:05 02/12/2013 14:10 02/12/2013 15:20 02/12/2013 16:25 02/12/2013 17:31 02/12/2013 18:34 02/12/2013 19:40 02/12/2013 20:35 02/13/2013 00:01 02/13/2013 04:12 02/13/2013 11:49  Glucose-Capillary Latest Range: 70-99 mg/dL 337 (H) 306 (H) 298 (H) 271 (H) 180 (H) 124 (H) 132 (H) 142 (H) 162 (H) 146 (H) 126 (H) 128 (H)   Please restart Lantus 10 units daily.    Will follow.  Thankl you. Lorenda Peck, RD, LDN, CDE Inpatient Diabetes Coordinator [phone]

## 2013-02-13 NOTE — Progress Notes (Signed)
Patient ID: [name], female   DOB: 06/08/1971, 42 y.o.   MRN: [id number] [facility] PROGRESS NOTE  PAYSLI STANFILL [id number] DOB: 1971/06/15 DOA: 02/12/2013 PCP: [name], MD  Brief narrative: Pt is 42 yo female with past medical history of poorly controlled diabetes mellitus, hypertension, diabetic gastroparesis with multiple admissions in the last 4 months for intractable nausea and vomiting who presented to [facility] ED with main concern of 2-3 days duration of progressively worsening nausea and non bloody vomiting, associated with epigastric pain, intermittent and sharp, 7/10 in severity when present, no specific radiating symptoms, no specific alleviating factors.   Principal Problem:   Intractable nausea and vomiting, abdominal pain - secondary to progressive gastroparesis - now clinically improving, no vomiting recorded per nursing staff - continue supportive care with IVF and antiemetics as needed Active Problems:   Gastroparesis - management as noted above   DM (diabetes mellitus), type 2, uncontrolled - with complications of gastroparesis - continue SSI for now, no significant nutritional intake - once it improves will change insulin regimen as indicated    Hyponatremia - secondary to pre renal etiology, stable ~131 - IVF and repeat BMP in AM   Hypertension - SBP in 120's, reasonable control for now   Hypokalemia - secondary to vomiting, supplemented - within normal limits this AM  Consultants:  None  Procedures/Studies: Dg Abd Acute W/chest 02/12/2013   No evidence for bowel obstruction.  No active cardiopulmonary disease.  Jejunal tube appears positioned within the proximal small bowel.   Antibiotics:  None  Code Status: Full Family Communication: Pt at bedside Disposition Plan: Home when medically stable  HPI/Subjective: No events overnight.   Objective: Filed Vitals:   02/12/13 2037 02/13/13 0500 02/13/13 0843 02/13/13 1503  BP: 140/92 112/80   124/81  Pulse: 112 97  96  Temp: 98.1 F (36.7 C) 97.9 F (36.6 C)  98.4 F (36.9 C)  TempSrc: Oral Oral  Oral  Resp: 21 18  18   Height:      Weight:  63.005 kg (138 lb 14.4 oz)    SpO2: 100% 99% 96% 100%    Intake/Output Summary (Last 24 hours) at 02/13/13 1817 Last data filed at 02/13/13 1718  Gross per 24 hour  Intake 660.83 ml  Output   2000 ml  Net -1339.17 ml    Exam:   General:  Pt is sleepy but easy to arouse, not in acute distress  Cardiovascular: Regular rate and rhythm, S1/S2, no murmurs, no rubs, no gallops  Respiratory: Clear to auscultation bilaterally, no wheezing, no crackles, no rhonchi  Abdomen: Soft, non distended, bowel sounds present, no guarding  Extremities: No edema, pulses DP and PT palpable bilaterally  Neuro: Grossly nonfocal  Data Reviewed: Basic Metabolic Panel:  Recent Labs Lab 02/08/13 0155 02/09/13 0840 02/10/13 0400 02/12/13 0818 02/12/13 1420 02/13/13 0200  NA 132* 133* 134* 129*  --  131*  K 3.3* 3.9 4.0 3.4*  --  4.3  CL 101 102 102 92*  --  97  CO2 23 26 26 24   --  25  GLUCOSE 100* 150* 196* 408*  --  143*  BUN 8 6 6 10   --  11  CREATININE 0.56 0.50 0.50 0.46*  --  0.81  CALCIUM 8.3* 8.7 8.5 9.3  --  8.9  MG 1.7  --   --   --  1.2*  --    Liver Function Tests:  Recent Labs Lab 02/07/13 0420 02/12/13 0818 02/13/13  0200  AST 11 16 16   ALT 6 13 10   ALKPHOS 106 123* 104  BILITOT 0.3 0.2* 0.2*  PROT 6.7 7.5 6.5  ALBUMIN 2.3* 3.1* 2.5*    Recent Labs Lab 02/12/13 0818  LIPASE 25   CBC:  Recent Labs Lab 02/07/13 0420 02/08/13 0155 02/09/13 0840 02/12/13 0818 02/13/13 0443  WBC 12.0* 8.0 6.8 9.9 9.6  NEUTROABS  --   --   --  7.9*  --   HGB 12.4 10.8* 10.8* 13.5 11.7*  HCT 36.7 31.9* 32.5* 39.0 34.2*  MCV 86.6 87.6 88.6 86.5 87.7  PLT 196 154 159 213 175   Cardiac Enzymes:  Recent Labs Lab 02/08/13 0155 02/08/13 0900  TROPONINI <0.30 <0.30   CBG:  Recent Labs Lab 02/12/13 1940  02/12/13 2035 02/13/13 0001 02/13/13 0412 02/13/13 1149  GLUCAP 142* 162* 146* 126* 128*    Recent Results (from the past 240 hour(s))  URINE CULTURE     Status: None   Collection Time    02/12/13 11:03 AM      Result Value Range Status   Specimen Description URINE, CLEAN CATCH   Final   Special Requests NONE   Final   Culture  Setup Time 02/12/2013 14:52   Final   Colony Count 30,000 COLONIES/ML   Final   Culture     Final   Value: Multiple bacterial morphotypes present, none predominant. Suggest appropriate recollection if clinically indicated.   Report Status 02/13/2013 FINAL   Final  CULTURE, BLOOD (ROUTINE X 2)     Status: None   Collection Time    02/12/13  2:20 PM      Result Value Range Status   Specimen Description BLOOD LEFT HAND   Final   Special Requests BOTTLES DRAWN AEROBIC ONLY 2CC   Final   Culture  Setup Time 02/12/2013 21:11   Final   Culture     Final   Value:        BLOOD CULTURE RECEIVED NO GROWTH TO DATE CULTURE WILL BE HELD FOR 5 DAYS BEFORE ISSUING A FINAL NEGATIVE REPORT   Report Status PENDING   Incomplete     Scheduled Meds: . amLODipine  10 mg Oral Daily  . gabapentin  600 mg Per Tube TID  . insulin aspart  0-9 Units Subcutaneous Q4H  . metoCLOPramide  10 mg Per Tube QID  . metoprolol succinate  50 mg Oral Daily  . mometasone-formoterol  2 puff Inhalation BID  . pantoprazole  IV  40 mg Intravenous Q12H  . sucralfate  1 g Oral TID WC & HS   Continuous Infusions: . dextrose 5 % and 0.45% NaCl 50 mL/hr at 02/13/13 0756  . feeding supplement (OSMOLITE 1.2 CAL) Stopped (02/12/13 1525)    [name], MD  [place] Pager [phone]  If 7PM-7AM, please contact night-coverage [email or web address] Password [facility] 02/13/2013, 6:17 PM   LOS: 1 day

## 2013-02-13 NOTE — Progress Notes (Signed)
J Tube unclamped to see if any residual. 5-10 ml clear drainage from J Tube. J Tube relamped.Harlene Ramus

## 2013-02-14 ENCOUNTER — Inpatient Hospital Stay (HOSPITAL_COMMUNITY): Payer: Medicare Other

## 2013-02-14 DIAGNOSIS — E860 Dehydration: Secondary | ICD-10-CM

## 2013-02-14 LAB — GLUCOSE, CAPILLARY
Glucose-Capillary: 119 mg/dL — ABNORMAL HIGH (ref 70–99)
Glucose-Capillary: 156 mg/dL — ABNORMAL HIGH (ref 70–99)
Glucose-Capillary: 67 mg/dL — ABNORMAL LOW (ref 70–99)

## 2013-02-14 LAB — CBC
MCV: 88.7 fL (ref 78.0–100.0)
Platelets: 161 10*3/uL (ref 150–400)
RBC: 3.72 MIL/uL — ABNORMAL LOW (ref 3.87–5.11)
RDW: 13.5 % (ref 11.5–15.5)
WBC: 7.7 10*3/uL (ref 4.0–10.5)

## 2013-02-14 LAB — BASIC METABOLIC PANEL
Calcium: 8.7 mg/dL (ref 8.4–10.5)
Chloride: 98 mEq/L (ref 96–112)
Creatinine, Ser: 0.73 mg/dL (ref 0.50–1.10)
GFR calc Af Amer: >90 mL/min (ref >90)
Sodium: 132 mEq/L — ABNORMAL LOW (ref 135–145)

## 2013-02-14 MED ORDER — DEXTROSE 50 % IV SOLN
25.0000 mL | Freq: Once | INTRAVENOUS | Status: AC | PRN
  Administered 2013-02-14: 25 mL via INTRAVENOUS
  Filled 2013-02-14: qty 50

## 2013-02-14 MED ORDER — POTASSIUM CHLORIDE 10 MEQ/100ML IV SOLN
10.0000 meq | INTRAVENOUS | Status: AC
  Administered 2013-02-14 (×3): 10 meq via INTRAVENOUS
  Filled 2013-02-14 (×3): qty 100

## 2013-02-14 MED ORDER — DEXTROSE-NACL 5-0.9 % IV SOLN
INTRAVENOUS | Status: DC
  Administered 2013-02-14 – 2013-02-15 (×2): via INTRAVENOUS

## 2013-02-14 MED ORDER — PROMETHAZINE HCL 25 MG PO TABS: 25.0000 mg | ORAL_TABLET | ORAL | Status: DC | PRN

## 2013-02-14 MED ORDER — SODIUM CHLORIDE 0.9 % IV SOLN
INTRAVENOUS | Status: DC
  Administered 2013-02-14: 01:00:00 via INTRAVENOUS

## 2013-02-14 MED ORDER — HYDROMORPHONE HCL PF 2 MG/ML IJ SOLN
2.0000 mg | INTRAMUSCULAR | Status: DC | PRN
  Administered 2013-02-14 – 2013-02-15 (×3): 2 mg via INTRAVENOUS
  Administered 2013-02-15: 3 mg via INTRAVENOUS
  Administered 2013-02-15 – 2013-02-16 (×8): 2 mg via INTRAVENOUS
  Administered 2013-02-16 (×4): 3 mg via INTRAVENOUS
  Administered 2013-02-16: 2 mg via INTRAVENOUS
  Administered 2013-02-16 (×2): 3 mg via INTRAVENOUS
  Administered 2013-02-16 – 2013-02-17 (×5): 2 mg via INTRAVENOUS
  Administered 2013-02-17: 3 mg via INTRAVENOUS
  Administered 2013-02-17 – 2013-02-18 (×9): 2 mg via INTRAVENOUS
  Administered 2013-02-18 (×2): 3 mg via INTRAVENOUS
  Administered 2013-02-18 – 2013-02-20 (×19): 2 mg via INTRAVENOUS
  Filled 2013-02-14: qty 2
  Filled 2013-02-14 (×9): qty 1
  Filled 2013-02-14: qty 2
  Filled 2013-02-14: qty 1
  Filled 2013-02-14: qty 2
  Filled 2013-02-14 (×9): qty 1
  Filled 2013-02-14: qty 2
  Filled 2013-02-14 (×9): qty 1
  Filled 2013-02-14 (×3): qty 2
  Filled 2013-02-14: qty 1
  Filled 2013-02-14: qty 2
  Filled 2013-02-14: qty 1
  Filled 2013-02-14: qty 2
  Filled 2013-02-14: qty 1
  Filled 2013-02-14: qty 2
  Filled 2013-02-14 (×14): qty 1

## 2013-02-14 MED ORDER — POTASSIUM CHLORIDE CRYS ER 20 MEQ PO TBCR
40.0000 meq | EXTENDED_RELEASE_TABLET | Freq: Once | ORAL | Status: DC
  Filled 2013-02-14: qty 2

## 2013-02-14 NOTE — Procedures (Signed)
Successful placement of right upper extremity approach dual lumen PICC line with tip at the superior caval-atrial junction.  The PICC line is ready for immediate use.

## 2013-02-14 NOTE — Progress Notes (Addendum)
[facility] PROGRESS NOTE  [name] [id number] DOB: [birth year] DOA: 02/12/2013 PCP: [name], MD  Brief narrative: 42 year old female with past medical history including but not limited to poorly controlled diabetes mellitus adn related diabetic gastroparesis and subsequent multiple admissions for gastroparesis, hypertension  who presented to [facility] ED 02/12/2013 with main concern of 2-3 days duration of progressively worsening nausea and non bloody vomiting, associated with epigastric pain, intermittent and sharp, 7/10 in severity when present, no specific radiating symptoms, no specific alleviating factors.   Assessment and Plan:  Principal Problem:  *Intractable nausea and vomiting, abdominal pain  - secondary to progressive gastroparesis related to poorly controlled diabetes - improving slowly; no further episodes of emesis noted by nursing staff - continue normal saline @ 50 cc/hr - continue tube feeds - phenergan 25 mg Q 6 hours IV PRN or supp PRN *Gastroparesis  - related to poorly controled diabetes - continue IV fluids and antiemetics as above - continue tube feeds - continue protonix 49 mg Q 12 hours IV - continue sucralfate 1 gm PO TID and HS  Active Problems:  DM (diabetes mellitus), type 2, uncontrolled  - with complications of gastroparesis and neuropathy - A1c 02/06/2013 is 8.9 indicating poor glycemic control - hold Lantus due to hypoglycemia  - continue SSI for now, no significant nutritional intake  - once it improves will change insulin regimen as indicated  Diabetic neuropathy - continue gabapentin 600 mg per tube TID Hyponatremia  - secondary to pre renal etiology, stable 131 - 132 - IVF and repeat BMP in AM  Hypertension  - BP 112/79 - continue Norvasc 10 mg daily and metoprolol 50 mg daily Hypokalemia  - secondary to GI losses - vomiting, supplemented  - follow up BMP in am  [place] [id number]   Consultants:   None Procedures/Studies:  Dg Abd Acute W/chest 02/12/2013 No evidence for bowel obstruction. No active cardiopulmonary disease. Jejunal tube appears positioned within the proximal small bowel.  Antibiotics:  None   Code Status: Full  Family Communication: Family not at the bedside  Disposition Plan: Home when medically stable   HPI/Subjective: No events overnight.   Objective: Filed Vitals:   02/13/13 1503 02/13/13 2125 02/14/13 0415 02/14/13 0759  BP: 124/81 119/74 112/79   Pulse: 96 100 85   Temp: 98.4 F (36.9 C) 98.6 F (37 C) 98.1 F (36.7 C)   TempSrc: Oral Oral Oral   Resp: 18 16 15    Height:      Weight:      SpO2: 100% 99% 98% 99%    Intake/Output Summary (Last 24 hours) at 02/14/13 1035 Last data filed at 02/14/13 [id number]  Gross per 24 hour  Intake 884.17 ml  Output   1000 ml  Net -115.83 ml    Exam:   General:  Pt is alert, not in acute distress  Cardiovascular: Regular rate and rhythm, S1/S2, no murmurs, no rubs, no gallops  Respiratory: Clear to auscultation bilaterally, no wheezing, no crackles, no rhonchi  Abdomen: Soft, some tenderness in mid abdomen, non distended, bowel sounds present, no guarding  Extremities: No edema, pulses DP and PT palpable bilaterally  Neuro: Grossly nonfocal  Data Reviewed: Basic Metabolic Panel:  Recent Labs Lab 02/08/13 0155 02/09/13 0840 02/10/13 0400 02/12/13 0818 02/12/13 1420 02/13/13 0200 02/14/13 0645  NA 132* 133* 134* 129*  --  131* 132*  K 3.3* 3.9 4.0 3.4*  --  4.3 3.4*  CL 101 102 102  92*  --  97 98  CO2 23 26 26 24   --  25 25  GLUCOSE 100* 150* 196* 408*  --  143* 72  BUN 8 6 6 10   --  11 12  CREATININE 0.56 0.50 0.50 0.46*  --  0.81 0.73  CALCIUM 8.3* 8.7 8.5 9.3  --  8.9 8.7  MG 1.7  --   --   --  1.2*  --   --    Liver Function Tests:  Recent Labs Lab 02/12/13 0818 02/13/13 0200  AST 16 16  ALT 13 10  ALKPHOS 123* 104  BILITOT 0.2* 0.2*  PROT 7.5 6.5  ALBUMIN 3.1* 2.5*     Recent Labs Lab 02/12/13 0818  LIPASE 25   No results found for this basename: AMMONIA,  in the last 168 hours CBC:  Recent Labs Lab 02/08/13 0155 02/09/13 0840 02/12/13 0818 02/13/13 0443 02/14/13 0645  WBC 8.0 6.8 9.9 9.6 7.7  NEUTROABS  --   --  7.9*  --   --   HGB 10.8* 10.8* 13.5 11.7* 11.1*  HCT 31.9* 32.5* 39.0 34.2* 33.0*  MCV 87.6 88.6 86.5 87.7 88.7  PLT 154 159 213 175 161   Cardiac Enzymes:  Recent Labs Lab 02/08/13 0155 02/08/13 0900  TROPONINI <0.30 <0.30   BNP: No components found with this basename: POCBNP,  CBG:  Recent Labs Lab 02/13/13 1719 02/13/13 2123 02/14/13 0003 02/14/13 0418 02/14/13 0755  GLUCAP 155* 139* 156* 80 67*    URINE CULTURE     Status: None   Collection Time    02/12/13 11:03 AM      Result Value Range Status   Specimen Description URINE, CLEAN CATCH   Final   Colony Count 30,000 COLONIES/ML   Final   Culture     Final   Value: Multiple bacterial morphotypes present, none predominant. Suggest appropriate recollection if clinically indicated.   Report Status 02/13/2013 FINAL   Final  CULTURE, BLOOD (ROUTINE X 2)     Status: None   Collection Time    02/12/13  2:20 PM      Result Value Range Status   Culture     Final   Value:        BLOOD CULTURE RECEIVED NO GROWTH TO DATE    Report Status PENDING   Incomplete     Scheduled Meds: . amLODipine  10 mg Oral Daily  . gabapentin  600 mg Per Tube TID  . insulin aspart  0-9 Units Subcutaneous Q4H  . insulin glargine  5 Units Subcutaneous QHS  . metoCLOPramide  10 mg Per Tube QID  . metoprolol succinate  50 mg Oral Daily  . mometasone-formoterol  2 puff Inhalation BID  . pantoprazole  40 mg Intravenous Q12H  . sucralfate  1 g Oral TID WC & HS   Continuous Infusions: . sodium chloride 50 mL/hr at 02/14/13 0030  . feeding supplement (OSMOLITE 1.2 CAL) Stopped (02/12/13 1525)     [name], MD  [place] Pager [phone]  If 7PM-7AM, please contact  night-coverage [email or web address] Password [email or web address] 02/14/2013, 10:35 AM   LOS: 2 days

## 2013-02-14 NOTE — Progress Notes (Signed)
Hypoglycemic Event  CBG: 67  Treatment: D50 IV 25 mL  Symptoms: None  Follow-up CBG: [id number] CBG Result:119  Possible Reasons for Event: Other: not taking PO's, nauseated  Comments/MD notified : Dr Mallie Darting, Darlina Sicilian  Remember to initiate Hypoglycemia Order Set & complete

## 2013-02-15 DIAGNOSIS — N179 Acute kidney failure, unspecified: Secondary | ICD-10-CM

## 2013-02-15 LAB — BASIC METABOLIC PANEL
BUN: 7 mg/dL (ref 6–23)
CO2: 26 mEq/L (ref 19–32)
Calcium: 8.8 mg/dL (ref 8.4–10.5)
Creatinine, Ser: 0.61 mg/dL (ref 0.50–1.10)
Glucose, Bld: 133 mg/dL — ABNORMAL HIGH (ref 70–99)
Sodium: 133 mEq/L — ABNORMAL LOW (ref 135–145)

## 2013-02-15 LAB — CBC
HCT: 32 % — ABNORMAL LOW (ref 36.0–46.0)
Hemoglobin: 10.7 g/dL — ABNORMAL LOW (ref 12.0–15.0)
MCHC: 33.4 g/dL (ref 30.0–36.0)
MCV: 88.6 fL (ref 78.0–100.0)

## 2013-02-15 LAB — GLUCOSE, CAPILLARY
Glucose-Capillary: 120 mg/dL — ABNORMAL HIGH (ref 70–99)
Glucose-Capillary: 140 mg/dL — ABNORMAL HIGH (ref 70–99)
Glucose-Capillary: 154 mg/dL — ABNORMAL HIGH (ref 70–99)
Glucose-Capillary: 178 mg/dL — ABNORMAL HIGH (ref 70–99)

## 2013-02-15 NOTE — Progress Notes (Signed)
Patient evaluated for community based chronic disease management services with [facility] as a benefit of the COPD Gold Initiative. Services have been accepted and written consents obtained.  Patient will receive a post discharge transition of care call and will be evaluated for monthly home visits for assessments and disease process education. Spoke with patient at bedside to explain [place] [facility] services. Left contact information and [facility] literature at bedside.  Confirmed PCP and home contact information.  Made inpatient Case Manager aware that [facility] following. Of note, [facility] Care Management services does not replace or interfere with any services that are arranged by inpatient case management or social work.  For additional questions or referrals please contact Corliss Blacker BSN RN [facility] Liaison at [phone].

## 2013-02-15 NOTE — Progress Notes (Signed)
Patient ID: [name], female   DOB: [birth year], 42 y.o.   MRN: [id number]  [facility] PROGRESS NOTE  [name] [id number] DOB: 1971-02-13 DOA: 02/12/2013 PCP: [name], MD  Brief narrative:  42 year old female with past medical history including but not limited to poorly controlled diabetes mellitus adn related diabetic gastroparesis and subsequent multiple admissions for gastroparesis, hypertension who presented to [facility] ED 02/12/2013 with main concern of 2-3 days duration of progressively worsening nausea and non bloody vomiting, associated with epigastric pain, intermittent and sharp, 7/10 in severity when present, no specific radiating symptoms, no specific alleviating factors.   Assessment and Plan:  Principal Problem:  *Intractable nausea and vomiting, abdominal pain  - secondary to progressive gastroparesis related to poorly controlled diabetes  - improving slowly; no further episodes of emesis noted by nursing staff  - continue normal saline @ 50 cc/hr  - continue tube feeds  - phenergan 25 mg Q 6 hours IV PRN or supp PRN  *Gastroparesis  - related to poorly controled diabetes  - continue IV fluids and antiemetics as above  - continue tube feeds as pt able to tolerate  - continue protonix 40 mg Q 12 hours IV  - pt refusing sucralfate Active Problems:  DM (diabetes mellitus), type 2, uncontrolled  - with complications of gastroparesis and neuropathy  - A1c 02/06/2013 is 8.9 indicating poor glycemic control  - hold Lantus due to hypoglycemia  - continue SSI for now, no significant nutritional intake  - once it improves will change insulin regimen as indicated  Diabetic neuropathy  - continue gabapentin 600 mg per tube TID  Hyponatremia  - secondary to pre renal etiology, stable overall - IVF and repeat BMP in AM  Hypertension  - BP 112/79  - continue Norvasc 10 mg daily and metoprolol 50 mg daily  Hypokalemia  - secondary to GI losses - vomiting,  supplemented  - follow up BMP in am   [place]  [id number]   Consultants:  None Procedures/Studies:  Dg Abd Acute W/chest 02/12/2013 No evidence for bowel obstruction. No active cardiopulmonary disease. Jejunal tube appears positioned within the proximal small bowel.  Antibiotics:  None  Code Status: Full  Family Communication: Family not at the bedside  Disposition Plan: Home when medically stable  HPI/Subjective: No events overnight.   Objective: Filed Vitals:   02/15/13 0506 02/15/13 0615 02/15/13 0639 02/15/13 0747  BP: 135/91  140/88   Pulse: 90  78   Temp: 97.4 F (36.3 C)     TempSrc: Oral     Resp:   14   Height:      Weight:  63.141 kg (139 lb 3.2 oz)    SpO2: 100%  99% 98%    Intake/Output Summary (Last 24 hours) at 02/15/13 1552 Last data filed at 02/15/13 1355  Gross per 24 hour  Intake   1352 ml  Output   1300 ml  Net     52 ml    Exam:   General:  Pt is alert, follows commands appropriately, not in acute distress  Cardiovascular: Regular rate and rhythm, S1/S2, no murmurs, no rubs, no gallops  Respiratory: Clear to auscultation bilaterally, no wheezing, no crackles, no rhonchi  Abdomen: Soft, non tender, non distended, bowel sounds present, no guarding  Extremities: No edema, pulses DP and PT palpable bilaterally  Neuro: Grossly nonfocal  Data Reviewed: Basic Metabolic Panel:  Recent Labs Lab 02/10/13 0400 02/12/13 0818 02/12/13 1420  02/13/13 0200 02/14/13 0645 02/15/13 0400  NA 134* 129*  --  131* 132* 133*  K 4.0 3.4*  --  4.3 3.4* 3.7  CL 102 92*  --  97 98 100  CO2 26 24  --  25 25 26   GLUCOSE 196* 408*  --  143* 72 133*  BUN 6 10  --  11 12 7   CREATININE 0.50 0.46*  --  0.81 0.73 0.61  CALCIUM 8.5 9.3  --  8.9 8.7 8.8  MG  --   --  1.2*  --   --   --    Liver Function Tests:  Recent Labs Lab 02/12/13 0818 02/13/13 0200  AST 16 16  ALT 13 10  ALKPHOS 123* 104  BILITOT 0.2* 0.2*  PROT 7.5 6.5  ALBUMIN 3.1*  2.5*    Recent Labs Lab 02/12/13 0818  LIPASE 25   No results found for this basename: AMMONIA,  in the last 168 hours CBC:  Recent Labs Lab 02/09/13 0840 02/12/13 0818 02/13/13 0443 02/14/13 0645 02/15/13 0400  WBC 6.8 9.9 9.6 7.7 6.4  NEUTROABS  --  7.9*  --   --   --   HGB 10.8* 13.5 11.7* 11.1* 10.7*  HCT 32.5* 39.0 34.2* 33.0* 32.0*  MCV 88.6 86.5 87.7 88.7 88.6  PLT 159 213 175 161 184   Cardiac Enzymes: No results found for this basename: CKTOTAL, CKMB, CKMBINDEX, TROPONINI,  in the last 168 hours BNP: No components found with this basename: POCBNP,  CBG:  Recent Labs Lab 02/14/13 2335 02/15/13 0350 02/15/13 0645 02/15/13 0816 02/15/13 1304  GLUCAP 100* 111* 154* 178* 140*    Recent Results (from the past 240 hour(s))  URINE CULTURE     Status: None   Collection Time    02/12/13 11:03 AM      Result Value Range Status   Specimen Description URINE, CLEAN CATCH   Final   Special Requests NONE   Final   Culture  Setup Time 02/12/2013 14:52   Final   Colony Count 30,000 COLONIES/ML   Final   Culture     Final   Value: Multiple bacterial morphotypes present, none predominant. Suggest appropriate recollection if clinically indicated.   Report Status 02/13/2013 FINAL   Final  CULTURE, BLOOD (ROUTINE X 2)     Status: None   Collection Time    02/12/13  2:20 PM      Result Value Range Status   Specimen Description BLOOD LEFT HAND   Final   Special Requests BOTTLES DRAWN AEROBIC ONLY 2CC   Final   Culture  Setup Time 02/12/2013 21:11   Final   Culture     Final   Value:        BLOOD CULTURE RECEIVED NO GROWTH TO DATE CULTURE WILL BE HELD FOR 5 DAYS BEFORE ISSUING A FINAL NEGATIVE REPORT   Report Status PENDING   Incomplete  CULTURE, BLOOD (ROUTINE X 2)     Status: None   Collection Time    02/13/13  2:00 AM      Result Value Range Status   Specimen Description BLOOD LEFT HAND   Final   Special Requests BOTTLES DRAWN AEROBIC ONLY  2ML   Final   Culture   Setup Time 02/13/2013 09:26   Final   Culture     Final   Value:        BLOOD CULTURE RECEIVED NO GROWTH TO DATE CULTURE WILL BE HELD FOR  5 DAYS BEFORE ISSUING A FINAL NEGATIVE REPORT   Report Status PENDING   Incomplete     Scheduled Meds: . amLODipine  10 mg Oral Daily  . gabapentin  600 mg Per Tube TID  . insulin aspart  0-9 Units Subcutaneous Q4H  . metoCLOPramide  10 mg Per Tube QID  . metoprolol succinate  50 mg Oral Daily  . mometasone-formoterol  2 puff Inhalation BID  . pantoprazole (PROTONIX) IV  40 mg Intravenous Q12H  . potassium chloride  40 mEq Oral Once  . sucralfate  1 g Oral TID WC & HS   Continuous Infusions: . dextrose 5 % and 0.9% NaCl 50 mL/hr at 02/15/13 1132  . feeding supplement (OSMOLITE 1.2 CAL) Stopped (02/12/13 1525)     [name], MD  [place] Pager [phone]  If 7PM-7AM, please contact night-coverage [email or web address] Password [email or web address] 02/15/2013, 3:52 PM   LOS: 3 days

## 2013-02-16 LAB — CBC
Hemoglobin: 9.5 g/dL — ABNORMAL LOW (ref 12.0–15.0)
MCH: 29.7 pg (ref 26.0–34.0)
RBC: 3.2 MIL/uL — ABNORMAL LOW (ref 3.87–5.11)

## 2013-02-16 LAB — BASIC METABOLIC PANEL
CO2: 26 mEq/L (ref 19–32)
Glucose, Bld: 139 mg/dL — ABNORMAL HIGH (ref 70–99)
Potassium: 3.4 mEq/L — ABNORMAL LOW (ref 3.5–5.1)
Sodium: 133 mEq/L — ABNORMAL LOW (ref 135–145)

## 2013-02-16 LAB — GLUCOSE, CAPILLARY
Glucose-Capillary: 144 mg/dL — ABNORMAL HIGH (ref 70–99)
Glucose-Capillary: 81 mg/dL (ref 70–99)

## 2013-02-16 MED ORDER — POTASSIUM CHLORIDE CRYS ER 20 MEQ PO TBCR
40.0000 meq | EXTENDED_RELEASE_TABLET | Freq: Once | ORAL | Status: AC
  Administered 2013-02-16: 40 meq via ORAL
  Filled 2013-02-16: qty 2

## 2013-02-16 MED ORDER — METOCLOPRAMIDE HCL 5 MG/ML IJ SOLN
10.0000 mg | Freq: Four times a day (QID) | INTRAMUSCULAR | Status: DC
  Administered 2013-02-16 – 2013-02-20 (×15): 10 mg via INTRAVENOUS
  Filled 2013-02-16 (×21): qty 2

## 2013-02-16 NOTE — Progress Notes (Addendum)
[facility] PROGRESS NOTE  [name] [id number] DOB: [birth year] DOA: 02/12/2013 PCP: [name], MD  Brief narrative: 42 year old female with past medical history including but not limited to poorly controlled diabetes mellitus adn related diabetic gastroparesis and subsequent multiple admissions for gastroparesis, hypertension who presented to [facility] ED 02/12/2013 with main concern of 2-3 days duration of progressively worsening nausea and non bloody vomiting, associated with epigastric pain, intermittent and sharp, 7/10 in severity when present, no specific radiating symptoms, no specific alleviating factors.   Assessment and Plan:   Principal Problem:  *Intractable nausea and vomiting, abdominal pain  - secondary to progressive gastroparesis related to poorly controlled diabetes  - patient is improving slowly - emesis resolved - continue tube feeds, osmolyte - continue phenergan 25 mg Q 6 hours IV PRN  - pain regimen with dilaudid 3 mg IV Q 2 hours PRN severe pain and oxycodone 10 mg per tube Q 6 hours PRN moderate pain *Gastroparesis  - related to poorly controled diabetes  - continue antiemetics as above - continue tube feeds osmolyte as pt able to tolerate  - continue protonix 40 mg Q 12 hours IV  - pt refusing sucralfate   Active Problems:  Anemia of chronic disease - slight drop in Hg since admission - overall stable and no signs of acute bleed - CBC in AM DM (diabetes mellitus), type 2, uncontrolled  - with complications of gastroparesis and neuropathy  - A1c 02/06/2013 is 8.9 indicating poor glycemic control  - hold Lantus due to hypoglycemia  - CBG's in past 24 hours: 141, 120 and 144 - continue SSI for now, no significant nutritional intake  - once it improves will change insulin regimen as indicated  Diabetic neuropathy  - continue gabapentin 600 mg per tube TID  Hyponatremia  - secondary to pre renal etiology, stable overall  - sodium 133 Hypertension   - BP 129/81 - continue Norvasc 10 mg daily and metoprolol 50 mg daily  Hypokalemia  - secondary to GI losses - vomiting, supplemented this AM  - follow up BMP in am   Mart Piggs  [facility]  [id number]   Consultants:  None Procedures/Studies:  Dg Abd Acute W/chest 02/12/2013 No evidence for bowel obstruction. No active cardiopulmonary disease. Jejunal tube appears positioned within the proximal small bowel.  Antibiotics:  None   Code Status: Full  Family Communication: Family not at the bedside  Disposition Plan: Home when medically stable   HPI/Subjective: No events overnight.   Objective: Filed Vitals:   02/15/13 0747 02/15/13 1552 02/15/13 2039 02/16/13 0511  BP:  121/94 109/69 129/81  Pulse:  93 87 86  Temp:  98.5 F (36.9 C) 98.6 F (37 C) 98.3 F (36.8 C)  TempSrc:  Oral Oral Oral  Resp:  18 18 18   Height:      Weight:      SpO2: 98% 100% 100% 100%    Intake/Output Summary (Last 24 hours) at 02/16/13 0658 Last data filed at 02/16/13 0503  Gross per 24 hour  Intake      0 ml  Output    650 ml  Net   -650 ml    Exam:   General:  Pt is alert, follows commands appropriately, not in acute distress  Cardiovascular: Regular rate and rhythm, S1/S2, no murmurs, no rubs, no gallops  Respiratory: Clear to auscultation bilaterally, no wheezing, no crackles, no rhonchi  Abdomen: Soft, non tender, non distended, bowel sounds present, no guarding  Extremities: No edema, pulses DP and PT palpable bilaterally  Neuro: Grossly nonfocal  Data Reviewed: Basic Metabolic Panel:  Recent Labs Lab 02/12/13 0818 02/12/13 1420 02/13/13 0200 02/14/13 0645 02/15/13 0400 02/16/13 0600  NA 129*  --  131* 132* 133* 133*  K 3.4*  --  4.3 3.4* 3.7 3.4*  CL 92*  --  97 98 100 99  CO2 24  --  25 25 26 26   GLUCOSE 408*  --  143* 72 133* 139*  BUN 10  --  11 12 7  4*  CREATININE 0.46*  --  0.81 0.73 0.61 0.59  CALCIUM 9.3  --  8.9 8.7 8.8 8.6  MG  --  1.2*  --   --   --    --    Liver Function Tests:  Recent Labs Lab 02/12/13 0818 02/13/13 0200  AST 16 16  ALT 13 10  ALKPHOS 123* 104  BILITOT 0.2* 0.2*  PROT 7.5 6.5  ALBUMIN 3.1* 2.5*    Recent Labs Lab 02/12/13 0818  LIPASE 25   No results found for this basename: AMMONIA,  in the last 168 hours CBC:  Recent Labs Lab 02/09/13 0840 02/12/13 0818 02/13/13 0443 02/14/13 0645 02/15/13 0400  WBC 6.8 9.9 9.6 7.7 6.4  NEUTROABS  --  7.9*  --   --   --   HGB 10.8* 13.5 11.7* 11.1* 10.7*  HCT 32.5* 39.0 34.2* 33.0* 32.0*  MCV 88.6 86.5 87.7 88.7 88.6  PLT 159 213 175 161 184   Cardiac Enzymes: No results found for this basename: CKTOTAL, CKMB, CKMBINDEX, TROPONINI,  in the last 168 hours BNP: No components found with this basename: POCBNP,  CBG:  Recent Labs Lab 02/15/13 1304 02/15/13 1558 02/15/13 1959 02/15/13 2335 02/16/13 0334  GLUCAP 140* 146* 141* 120* 144*    Recent Results (from the past 240 hour(s))  URINE CULTURE     Status: None   Collection Time    02/12/13 11:03 AM      Result Value Range Status   Specimen Description URINE, CLEAN CATCH   Final   Special Requests NONE   Final   Culture  Setup Time 02/12/2013 14:52   Final   Colony Count 30,000 COLONIES/ML   Final   Culture     Final   Value: Multiple bacterial morphotypes present, none predominant. Suggest appropriate recollection if clinically indicated.   Report Status 02/13/2013 FINAL   Final  CULTURE, BLOOD (ROUTINE X 2)     Status: None   Collection Time    02/12/13  2:20 PM      Result Value Range Status   Specimen Description BLOOD LEFT HAND   Final   Special Requests BOTTLES DRAWN AEROBIC ONLY 2CC   Final   Culture  Setup Time 02/12/2013 21:11   Final   Culture     Final   Value:        BLOOD CULTURE RECEIVED NO GROWTH TO DATE CULTURE WILL BE HELD FOR 5 DAYS BEFORE ISSUING A FINAL NEGATIVE REPORT   Report Status PENDING   Incomplete  CULTURE, BLOOD (ROUTINE X 2)     Status: None   Collection Time     02/13/13  2:00 AM      Result Value Range Status   Specimen Description BLOOD LEFT HAND   Final   Special Requests BOTTLES DRAWN AEROBIC ONLY  2ML   Final   Culture  Setup Time 02/13/2013 09:26   Final  Culture     Final   Value:        BLOOD CULTURE RECEIVED NO GROWTH TO DATE CULTURE WILL BE HELD FOR 5 DAYS BEFORE ISSUING A FINAL NEGATIVE REPORT   Report Status PENDING   Incomplete     Scheduled Meds: . amLODipine  10 mg Oral Daily  . gabapentin  600 mg Per Tube TID  . insulin aspart  0-9 Units Subcutaneous Q4H  . metoCLOPramide  10 mg Per Tube QID  . metoprolol succinate  50 mg Oral Daily  . mometasone-formoterol  2 puff Inhalation BID  . pantoprazole (PROTONIX) IV  40 mg Intravenous Q12H  . potassium chloride  40 mEq Oral Once  . sucralfate  1 g Oral TID WC & HS   Continuous Infusions: . dextrose 5 % and 0.9% NaCl 50 mL/hr at 02/15/13 1132  . feeding supplement (OSMOLITE 1.2 CAL) Stopped (02/12/13 1525)     [name], MD  [place] Pager [phone]  If 7PM-7AM, please contact night-coverage [email or web address] Password [email or web address] 02/16/2013, 6:58 AM   LOS: 4 days

## 2013-02-17 DIAGNOSIS — Z87898 Personal history of other specified conditions: Secondary | ICD-10-CM

## 2013-02-17 DIAGNOSIS — E1140 Type 2 diabetes mellitus with diabetic neuropathy, unspecified: Secondary | ICD-10-CM | POA: Diagnosis present

## 2013-02-17 DIAGNOSIS — R4702 Dysphasia: Secondary | ICD-10-CM | POA: Diagnosis present

## 2013-02-17 LAB — CBC
HCT: 31.8 % — ABNORMAL LOW (ref 36.0–46.0)
Hemoglobin: 10.7 g/dL — ABNORMAL LOW (ref 12.0–15.0)
MCH: 29.4 pg (ref 26.0–34.0)
MCV: 87.4 fL (ref 78.0–100.0)
Platelets: 221 10*3/uL (ref 150–400)
RBC: 3.64 MIL/uL — ABNORMAL LOW (ref 3.87–5.11)

## 2013-02-17 LAB — GLUCOSE, CAPILLARY
Glucose-Capillary: 82 mg/dL (ref 70–99)
Glucose-Capillary: 72 mg/dL (ref 70–99)
Glucose-Capillary: 134 mg/dL — ABNORMAL HIGH (ref 70–99)
Glucose-Capillary: 137 mg/dL — ABNORMAL HIGH (ref 70–99)

## 2013-02-17 LAB — BASIC METABOLIC PANEL
BUN: 3 mg/dL — ABNORMAL LOW (ref 6–23)
CO2: 27 mEq/L (ref 19–32)
Calcium: 9.3 mg/dL (ref 8.4–10.5)
Creatinine, Ser: 0.52 mg/dL (ref 0.50–1.10)
Glucose, Bld: 85 mg/dL (ref 70–99)

## 2013-02-17 MED ORDER — POTASSIUM CHLORIDE IN NACL 40-0.9 MEQ/L-% IV SOLN
INTRAVENOUS | Status: DC
  Administered 2013-02-17 – 2013-02-20 (×4): via INTRAVENOUS
  Filled 2013-02-17 (×5): qty 1000

## 2013-02-17 MED ORDER — INSULIN GLARGINE 100 UNIT/ML ~~LOC~~ SOLN: 10.0000 [IU] | Freq: Every day | SUBCUTANEOUS | Status: DC

## 2013-02-17 NOTE — Progress Notes (Signed)
[facility] PROGRESS NOTE  [name] [id number] DOB: [birth year] DOA: 02/12/2013 PCP: [name], MD  Brief narrative: [name] is an 42 y.o. female with a PMH of poorly controlled diabetes, diabetic gastroparesis status post PEG tube, hypertension with multiple prior hospitalizations for treatment of gastroparesis flare was admitted on 02/12/2013 with nausea, vomiting and epigastric pain consistent with her usual gastroparesis flare.  Assessment/Plan: Principal Problem:   Intractable nausea and vomiting secondary to gastroparesis -Secondary to gastroparesis flare. -Currently n.p.o. except for ice chips, will advance diet to clear liquids today. -Continue Reglan. Continue antinausea medications as needed. -Continue tube feeds with Osmolite 1.2, currently at goal of 60 cc/hour. Active Problems:   Hypertension -Controlled on Norvasc and metoprolol.    DM (diabetes mellitus), type 2, uncontrolled -Hemoglobin A1c 8.9% on 02/06/2013. -Seen by diabetes coordinator. -Lantus currently on hold. CBGs 72-146.   Hypokalemia -Add potassium to IV fluids.   Hyponatremia -Likely secondary to dehydration. Resolved with hydration.   Abdominal pain -Patient has some element of chronic pain and is opiate dependent.   Dysphasia -Speech therapy evaluation requested. Patient complains of feeling of food being stuck in her esophagus.   History of solitary pulmonary nodule -Reports a pulmonary nodule was discovered on imaging at [facility]. Will need to obtain records.   Diabetic neuropathy -Continue gabapentin.   Code Status: Full. Family Communication: None at bedside. Disposition Plan: Home when stable.   Medical Consultants:  None.  Other Consultants:  Diabetes coordinator  Anti-infectives:  None.  HPI/Subjective: [name] is feeling a bit better. No active vomiting. She's been sipping on water/ice chips.  Objective: Filed Vitals:   02/16/13 2100 02/17/13 0529  02/17/13 0801 02/17/13 1313  BP: 145/85 140/70  128/91  Pulse: 80 92  95  Temp: 97.3 F (36.3 C) 97.6 F (36.4 C)  97.4 F (36.3 C)  TempSrc: Oral Oral  Oral  Resp: 18 16  18   Height:      Weight:  63.1 kg (139 lb 1.8 oz)    SpO2: 93% 96% 100% 100%    Intake/Output Summary (Last 24 hours) at 02/17/13 1606 Last data filed at 02/17/13 1300  Gross per 24 hour  Intake      0 ml  Output   4100 ml  Net  -4100 ml    Exam: Gen:  NAD Cardiovascular:  RRR, No M/R/G Respiratory:  Lungs CTAB Gastrointestinal:  Abdomen soft, NT/ND, + BS Extremities:  No C/E/C  Data Reviewed: Basic Metabolic Panel:  Recent Labs Lab 02/12/13 1420 02/13/13 0200 02/14/13 0645 02/15/13 0400 02/16/13 0600 02/17/13 0606  NA  --  131* 132* 133* 133* 136  K  --  4.3 3.4* 3.7 3.4* 3.4*  CL  --  97 98 100 99 98  CO2  --  25 25 26 26 27   GLUCOSE  --  143* 72 133* 139* 85  BUN  --  11 12 7  4* 3*  CREATININE  --  0.81 0.73 0.61 0.59 0.52  CALCIUM  --  8.9 8.7 8.8 8.6 9.3  MG 1.2*  --   --   --   --   --    GFR Estimated Creatinine Clearance: 81.6 ml/min (by C-G formula based on Cr of 0.52). Liver Function Tests:  Recent Labs Lab 02/12/13 0818 02/13/13 0200  AST 16 16  ALT 13 10  ALKPHOS 123* 104  BILITOT 0.2* 0.2*  PROT 7.5 6.5  ALBUMIN 3.1* 2.5*    Recent Labs  Lab 02/12/13 0818  LIPASE 25   Coagulation profile  Recent Labs Lab 02/13/13 0443  INR 0.89    CBC:  Recent Labs Lab 02/12/13 0818 02/13/13 0443 02/14/13 0645 02/15/13 0400 02/16/13 0600 02/17/13 0606  WBC 9.9 9.6 7.7 6.4 6.9 7.6  NEUTROABS 7.9*  --   --   --   --   --   HGB 13.5 11.7* 11.1* 10.7* 9.5* 10.7*  HCT 39.0 34.2* 33.0* 32.0* 28.4* 31.8*  MCV 86.5 87.7 88.7 88.6 88.8 87.4  PLT 213 175 161 184 179 221   CBG:  Recent Labs Lab 02/16/13 2054 02/17/13 0034 02/17/13 0358 02/17/13 0755 02/17/13 1154  GLUCAP 121* 146* 82 72 134*   Microbiology Recent Results (from the past 240 hour(s))  URINE  CULTURE     Status: None   Collection Time    02/12/13 11:03 AM      Result Value Range Status   Specimen Description URINE, CLEAN CATCH   Final   Special Requests NONE   Final   Culture  Setup Time 02/12/2013 14:52   Final   Colony Count 30,000 COLONIES/ML   Final   Culture     Final   Value: Multiple bacterial morphotypes present, none predominant. Suggest appropriate recollection if clinically indicated.   Report Status 02/13/2013 FINAL   Final  CULTURE, BLOOD (ROUTINE X 2)     Status: None   Collection Time    02/12/13  2:20 PM      Result Value Range Status   Specimen Description BLOOD LEFT HAND   Final   Special Requests BOTTLES DRAWN AEROBIC ONLY 2CC   Final   Culture  Setup Time 02/12/2013 21:11   Final   Culture     Final   Value:        BLOOD CULTURE RECEIVED NO GROWTH TO DATE CULTURE WILL BE HELD FOR 5 DAYS BEFORE ISSUING A FINAL NEGATIVE REPORT   Report Status PENDING   Incomplete  CULTURE, BLOOD (ROUTINE X 2)     Status: None   Collection Time    02/13/13  2:00 AM      Result Value Range Status   Specimen Description BLOOD LEFT HAND   Final   Special Requests BOTTLES DRAWN AEROBIC ONLY  2ML   Final   Culture  Setup Time 02/13/2013 09:26   Final   Culture     Final   Value:        BLOOD CULTURE RECEIVED NO GROWTH TO DATE CULTURE WILL BE HELD FOR 5 DAYS BEFORE ISSUING A FINAL NEGATIVE REPORT   Report Status PENDING   Incomplete     Procedures and Diagnostic Studies: Ct Abdomen Pelvis Wo Contrast  02/06/2013   *RADIOLOGY REPORT*  Clinical Data: Abdominal discomfort, gastroesophageal reflux, diabetic gastroparesis  CT ABDOMEN AND PELVIS WITHOUT CONTRAST  Technique:  Multidetector CT imaging of the abdomen and pelvis was performed following the standard protocol without intravenous contrast.  Comparison: 12/24/2012  Findings: Lung bases clear.  Normal heart size.  Circumferential wall thickening of the distal esophagus with an associated small hiatal hernia, this can be  seen with chronic esophagitis/reflux. No pericardial or pleural effusion.  Abdomen:  The balloon retention gastrojejunostomy is coiled the stomach. Prior cholecystectomy noted.  Liver, biliary system, pancreas, spleen, adrenal glands, and kidneys demonstrate no acute process by noncontrast imaging.  Negative for bowel obstruction, dilatation, ileus pattern, or free air.  No hydronephrosis or obstructing urinary tract calculus demonstrated.  No  evidence of hemorrhage, adenopathy, fluid collection, or abscess.  Atherosclerosis of the aorta without aneurysm.  Postop changes of the bowel in the left upper quadrant noted.  Pelvis:  Moderate distention of the urinary bladder.  Prior hysterectomy evident.  No pelvic free fluid, fluid collection, hemorrhage, abscess, inguinal abnormality, or hernia.  Pelvic calcifications noted consistent with venous phleboliths.  Postop changes of the lumbar spine.  IMPRESSION: Jejunal feeding tube coiled in the stomach.  Thickened distal esophageal wall with an associated hiatal hernia, suspect chronic reflux/esophagitis.  Prior cholecystectomy and hysterectomy  No acute intra-abdominal or pelvic process by noncontrast imaging.  No obstructing urinary tract calculus  Moderate to severe bladder distention   Original Report Authenticated By: Jerilynn Mages. Annamaria Boots, M.D.   US Abdomen Complete  02/06/2013   *RADIOLOGY REPORT*  Clinical Data:  Elevated alkaline phosphatase level, hypertension, diabetes  COMPLETE ABDOMINAL ULTRASOUND  Comparison:  CT abdomen pelvis of 02/06/2013  Findings:  Gallbladder:  The gallbladder has previously been resected.  Common bile duct:  The common bile duct is normal measuring 4.6 mm in diameter.  Liver:  The liver is unremarkable with no focal abnormality noted. No ductal dilatation.  IVC:  Appears normal.  Pancreas:  The midportion of the pancreas appears normal with the head and tail obscured by bowel gas.  Spleen:  Spleen is normal measuring 5.3 cm sagittally.  Right  Kidney:  No hydronephrosis is seen.  The right kidney measures 13.3 cm sagittally and does appear to be somewhat echogenic.  Chronic renal medical disease is a consideration.  Left Kidney:  No hydronephrosis is seen.  The left kidney measures 14.2 cm sagittally and echogenicity of the renal parenchyma appears more normal.  Abdominal aorta:  The abdominal aorta is normal in caliber.  IMPRESSION:  1.  Somewhat echogenic right renal parenchyma of questionable significance.  Correlate with renal laboratory values.  No hydronephrosis. 2.  The liver is unremarkable. 3.  Prior cholecystectomy   Original Report Authenticated By: Ivar Drape, M.D.   Ir Fluoro Guide Cv Line Right  02/14/2013   *RADIOLOGY REPORT*  Indication: Poor venous access  ULTRASOUND AND FLUORSCOPIC GUIDED PICC LINE INSERTION  Intravenous Medications: None  Contrast: None  Fluoroscopy Time:  6-seconds  Complications: None immediate  Technique / Findings:  The procedure, risks, benefits, and alternatives were explained to the patient and informed written consent was obtained.  A timeout was performed prior to the initiation of the procedure.  The right upper extremity was prepped with chlorhexidine in a sterile fashion, and a sterile drape was applied covering the operative field.  Maximum barrier sterile technique with sterile gowns and gloves were used for the procedure.  A timeout was performed prior to the initiation of the procedure.  Local anesthesia was provided with 1% lidocaine.  Under direct ultrasound guidance, the rightbrachialvein was accessed with a micropuncture kit after the overlying soft tissues were anesthetized with 1% lidocaine.  An ultrasound image was saved for documentation purposes.  A guidewire was advanced to the level of the superior caval-atrial junction for measurement purposes and the PICC line was cut to length.  A peel-away sheath was placed and a 36 cm, 5 [place], dual lumen was inserted to level of the superior  caval-atrial junction.  A post procedure spot fluoroscopic was obtained.  The catheter easily aspirated and flushed and was sutured in place.  A dressing was placed.  The patient tolerated the procedure well without immediate post procedural complication.  Impression:  Successful  ultrasound and fluoroscopic guided placement of a right brachial vein approach, 36 cm, 5 French,dual lumen PICC with tip at the superior caval-atrial junction.  The PICC line is ready for immediate use.   Original Report Authenticated By: Jake Seats, MD   Ir Fluoro Guide Cv Line Right  02/08/2013   *RADIOLOGY REPORT*  Clinical Data: Nausea, vomiting, abdominal pain, diabetic gastroparesis and poor IV access.  PICC LINE PLACEMENT WITH ULTRASOUND AND FLUOROSCOPIC  GUIDANCE  Fluoroscopy Time: 18 seconds  The right arm was prepped with chlorhexidine, draped in the usual sterile fashion using maximum barrier technique (cap and mask, sterile gown, sterile gloves, large sterile sheet, hand hygiene and cutaneous antisepsis) and infiltrated locally with 1% Lidocaine.  Ultrasound demonstrated patency of the right brachial vein, and this was documented with an image.  Under real-time ultrasound guidance, this vein was accessed with a 21 gauge micropuncture needle and image documentation was performed.  The needle was exchanged over a guidewire for a peel-away sheath through which a 5 [place] dual lumen PICC trimmed to 39 cm was advanced, positioned with its tip at the lower SVC/right atrial junction.  Fluoroscopy during the procedure and fluoro spot radiograph confirms appropriate catheter position.  The catheter was flushed, secured to the skin with Prolene sutures, and covered with a sterile dressing.  Complications:  None  IMPRESSION: Successful right arm PICC line placement with ultrasound and fluoroscopic guidance.  The catheter is ready for use.   Original Report Authenticated By: Aletta Edouard, M.D.   Ir Replc Duoden/jejuno Tube Percut  W/fluoro  02/07/2013   *RADIOLOGY REPORT*  Clinical Data: Malpositioned jejunal feeding tube coiled in the stomach.  FLUOROSCOPIC REPLACEMENT OF THE 18-FRENCH JEJUNAL FEEDING TUBE  Date:  02/07/2013 10:45:00  Radiologist:  M. Daryll Brod, M.D.  Medications:  None.  Guidance:  Fluoroscopic  Fluoroscopy time:  6 minutes  Sedation time:  None.  Contrast volume:  10 ml [id number]  Complications:  No immediate  PROCEDURE/FINDINGS:  Informed consent was obtained from the patient following explanation of the procedure, risks, benefits and alternatives. The patient understands, agrees and consents for the procedure. All questions were addressed.  A time out was performed.  Maximal barrier sterile technique utilized including caps, mask, sterile gowns, sterile gloves, large sterile drape, hand hygiene, and betadine  Under sterile conditions, the existing malpositioned feeding tube was removed.  A C2 catheter and a glidewire were utilized to manipulate the access into the proximal jejunum.  Over the guide wire, a new 18-French balloon retention jejunostomy was advanced. Tip position confirmed the proximal jejunum with a contrast injection.  Images obtained for documentation.  Retention balloon inflated with 10 ml saline and retracted against anterior gastric wall.  No immediate complication.  The patient tolerated the procedure well.  IMPRESSION: Successful fluoroscopic replacement of the 18-French jejunostomy. Tip in the proximal jejunum ready for use   Original Report Authenticated By: Jerilynn Mages. Annamaria Boots, M.D.   Ir US Guide Vasc Access Left  02/14/2013   *RADIOLOGY REPORT*  Indication: Poor venous access  ULTRASOUND AND FLUORSCOPIC GUIDED PICC LINE INSERTION  Intravenous Medications: None  Contrast: None  Fluoroscopy Time:  6-seconds  Complications: None immediate  Technique / Findings:  The procedure, risks, benefits, and alternatives were explained to the patient and informed written consent was obtained.  A timeout was  performed prior to the initiation of the procedure.  The right upper extremity was prepped with chlorhexidine in a sterile fashion, and a sterile drape was applied  covering the operative field.  Maximum barrier sterile technique with sterile gowns and gloves were used for the procedure.  A timeout was performed prior to the initiation of the procedure.  Local anesthesia was provided with 1% lidocaine.  Under direct ultrasound guidance, the rightbrachialvein was accessed with a micropuncture kit after the overlying soft tissues were anesthetized with 1% lidocaine.  An ultrasound image was saved for documentation purposes.  A guidewire was advanced to the level of the superior caval-atrial junction for measurement purposes and the PICC line was cut to length.  A peel-away sheath was placed and a 36 cm, 5 [place], dual lumen was inserted to level of the superior caval-atrial junction.  A post procedure spot fluoroscopic was obtained.  The catheter easily aspirated and flushed and was sutured in place.  A dressing was placed.  The patient tolerated the procedure well without immediate post procedural complication.  Impression:  Successful ultrasound and fluoroscopic guided placement of a right brachial vein approach, 36 cm, 5 French,dual lumen PICC with tip at the superior caval-atrial junction.  The PICC line is ready for immediate use.   Original Report Authenticated By: Jake Seats, MD   Ir US Guide Vasc Access Right  02/08/2013   *RADIOLOGY REPORT*  Clinical Data: Nausea, vomiting, abdominal pain, diabetic gastroparesis and poor IV access.  PICC LINE PLACEMENT WITH ULTRASOUND AND FLUOROSCOPIC  GUIDANCE  Fluoroscopy Time: 18 seconds  The right arm was prepped with chlorhexidine, draped in the usual sterile fashion using maximum barrier technique (cap and mask, sterile gown, sterile gloves, large sterile sheet, hand hygiene and cutaneous antisepsis) and infiltrated locally with 1% Lidocaine.  Ultrasound  demonstrated patency of the right brachial vein, and this was documented with an image.  Under real-time ultrasound guidance, this vein was accessed with a 21 gauge micropuncture needle and image documentation was performed.  The needle was exchanged over a guidewire for a peel-away sheath through which a 5 [place] dual lumen PICC trimmed to 39 cm was advanced, positioned with its tip at the lower SVC/right atrial junction.  Fluoroscopy during the procedure and fluoro spot radiograph confirms appropriate catheter position.  The catheter was flushed, secured to the skin with Prolene sutures, and covered with a sterile dressing.  Complications:  None  IMPRESSION: Successful right arm PICC line placement with ultrasound and fluoroscopic guidance.  The catheter is ready for use.   Original Report Authenticated By: Aletta Edouard, M.D.   Dg Chest Portable 1 View  01/19/2013   *RADIOLOGY REPORT*  Clinical Data: Status post central venous catheter placement  PORTABLE CHEST - 1 VIEW  Comparison: 01/19/2013  Findings: Left IJ catheter has been placed.  The tip is in the right atrium.  Heart size is normal.  No pleural effusion or edema. No airspace consolidation identified.  No pneumothorax identified.  IMPRESSION:  1.  Left IJ catheter tip is in the right atrium.   Original Report Authenticated By: Kerby Moors, M.D.   Dg Abd Acute W/chest  02/12/2013   *RADIOLOGY REPORT*  Clinical Data: Nausea and vomiting with distention.  ACUTE ABDOMEN SERIES (ABDOMEN 2 VIEW & CHEST 1 VIEW)  Comparison: None.  Findings: Normal heart size with clear lung fields.  No bony abnormality.  No free air.  Jejunal tube has been injected with dilute contrast and fills the proximal jejunum.  There is no bowel obstruction.  Surgical clips right upper quadrant. Contrast material from a separate procedure opacifies the colon.  Osseous structures unremarkable.  Prior lumbar fusion.  IMPRESSION: No evidence for bowel obstruction.  No active  cardiopulmonary disease.  Jejunal tube appears positioned within the proximal small bowel.   Original Report Authenticated By: Rolla Flatten, M.D.   Dg Abd Acute W/chest  02/06/2013   *RADIOLOGY REPORT*  Clinical Data: Mid abdominal pain and vomiting for 24 hours.  ACUTE ABDOMEN SERIES (ABDOMEN 2 VIEW & CHEST 1 VIEW)  Comparison: Chest 01/19/2013.  Abdominal series 01/19/2013.  Findings: Slightly shallow inspiration. The heart size and pulmonary vascularity are normal. The lungs appear clear and expanded without focal air space disease or consolidation. No blunting of the costophrenic angles.  Central perihilar interstitial changes suggest chronic bronchitis.  No pneumothorax. Mediastinal contours appear intact.  Scattered gas and stool in the colon.  No small or large bowel distension.  Postoperative changes in the left abdomen consistent with bowel staples. Surgical clips in the right upper quadrant.  No free air in the abdomen.  No abnormal air fluid levels.  Calcified phleboliths in the pelvis.  No radiopaque stones.  Postoperative changes in the lumbar spine.  IMPRESSION: No evidence of active pulmonary disease.  Probable chronic bronchitic changes.  Nonobstructive bowel gas pattern.   Original Report Authenticated By: Lucienne Capers, M.D.   Dg Abd Acute W/chest  01/19/2013   *RADIOLOGY REPORT*  Clinical Data: Abdominal pain.  Nausea and vomiting  ACUTE ABDOMEN SERIES (ABDOMEN 2 VIEW & CHEST 1 VIEW)  Comparison: 01/18/2013  Findings: Normal heart size.  No pleural effusion or edema identified.  Percutaneous gastrojejunostomy tube is identified. The bowel gas pattern appears nonobstructive.  There is enteric contrast material noted within the colon.  Cholecystectomy clips are present in the right upper quadrant.  IMPRESSION:  1.  Nonobstructive bowel gas pattern. 2.  Gastrojejunostomy tube in place.   Original Report Authenticated By: Kerby Moors, M.D.    Scheduled Meds: . amLODipine  10 mg Oral Daily   . gabapentin  600 mg Per Tube TID  . insulin aspart  0-9 Units Subcutaneous Q4H  . metoCLOPramide (REGLAN) injection  10 mg Intravenous QID  . metoprolol succinate  50 mg Oral Daily  . mometasone-formoterol  2 puff Inhalation BID  . pantoprazole (PROTONIX) IV  40 mg Intravenous Q12H  . sucralfate  1 g Oral TID WC & HS   Continuous Infusions: . dextrose 5 % and 0.9% NaCl 50 mL/hr at 02/15/13 1132  . feeding supplement (OSMOLITE 1.2 CAL) Stopped (02/12/13 1525)    Time spent: 25 minutes  LOS: 5 days   Arish Redner  [facility] Pager [phone].   *Please note that the hospitalists switch teams on Wednesdays. Please call the flow manager at [phone] if you are having difficulty reaching the hospitalist taking care of this patient as she can update you and provide the most up-to-date pager number of provider caring for the patient. If 8PM-8AM, please contact night-coverage at [email or web address], password [facility]  02/17/2013, 4:06 PM

## 2013-02-18 ENCOUNTER — Inpatient Hospital Stay (HOSPITAL_COMMUNITY): Payer: Medicare Other

## 2013-02-18 LAB — GLUCOSE, CAPILLARY: Glucose-Capillary: 92 mg/dL (ref 70–99)

## 2013-02-18 LAB — CULTURE, BLOOD (ROUTINE X 2): Culture: NO GROWTH

## 2013-02-18 MED ORDER — POTASSIUM CHLORIDE 20 MEQ/15ML (10%) PO LIQD
20.0000 meq | Freq: Three times a day (TID) | ORAL | Status: AC
  Administered 2013-02-18 – 2013-02-19 (×4): 20 meq
  Filled 2013-02-18 (×4): qty 15

## 2013-02-18 NOTE — Consult Note (Signed)
Subjective:   HPI  The patient is a 42 year old female with a history of diabetic gastroparesis who complains of ongoing problems with vomiting for several months. She states that she has recently been vomiting more and noticing a little blood when she vomits. She had a barium swallow done today which showed what appeared to be complete obstruction of the distal esophagus, which was felt to favor achalasia more than a stricture. She has had a couple of EGDs this year showing distal esophagitis but no obvious stricture. She has a PEJ tube.  Review of Systems No chest pain or shortness of breath  Past Medical History  Diagnosis Date  . Hypertension   . Diabetes mellitus   . Gastroparesis   . Asthma   . GERD (gastroesophageal reflux disease)   . Coronary artery disease   . h/o seizure   . Neuropathy   . Chronic pancreatitis   . Dyslipidemia   . MI (myocardial infarction)   . COPD (chronic obstructive pulmonary disease)   . Seizures    Past Surgical History  Procedure Laterality Date  . Abdominal hysterectomy    . Cholecystectomy    . Peg tube x 4      feeding jejunostomies with PEG tubes  . Cesarean section    . Colon resection due to diverticulitis    . Back surgery    . Esophagogastroduodenoscopy N/A 12/27/2012    Procedure: ESOPHAGOGASTRODUODENOSCOPY (EGD);  Surgeon: Missy Sabins, MD;  Location: [facility];  Service: Endoscopy;  Laterality: N/A;   History   Social History  . Marital Status: Divorced    Spouse Name: N/A    Number of Children: N/A  . Years of Education: N/A   Occupational History  . unemployed    Social History Main Topics  . Smoking status: Current Every Day Smoker -- 0.25 packs/day for 2 years    Types: Cigarettes  . Smokeless tobacco: Never Used  . Alcohol Use: No     Comment: hx 1 beer daily -pt states she quit Dec. 2012  . Drug Use: No  . Sexually Active: No   Other Topics Concern  . Not on file   Social History Narrative  . No  narrative on file   family history includes Cancer in her mother. Current facility-administered medications:0.9 % NaCl with KCl 40 mEq / L  infusion, , Intravenous, Continuous, Venetia Maxon Rama, MD, Last Rate: 50 mL/hr at 02/18/13 1205;  amLODipine (NORVASC) tablet 10 mg, 10 mg, Oral, Daily, Charlynne Cousins, MD, 10 mg at 02/18/13 0956;  dextrose 50 % solution 25 mL, 25 mL, Intravenous, PRN, Charlynne Cousins, MD feeding supplement (OSMOLITE 1.2 CAL) liquid 1,000 mL, 1,000 mL, Per Tube, Continuous, Charlynne Cousins, MD;  gabapentin (NEURONTIN) capsule 600 mg, 600 mg, Per Tube, TID, Charlynne Cousins, MD, 600 mg at 02/18/13 1613;  HYDROmorphone (DILAUDID) injection 2-3 mg, 2-3 mg, Intravenous, Q2H PRN, Theodis Blaze, MD, 2 mg at 02/18/13 1613 insulin aspart (novoLOG) injection 0-9 Units, 0-9 Units, Subcutaneous, Q4H, Gardiner Barefoot, NP, 1 Units at 02/17/13 2053;  LORazepam (ATIVAN) injection 0.5 mg, 0.5 mg, Intravenous, Q6H PRN, Theodis Blaze, MD, 0.5 mg at 02/18/13 1626;  metoCLOPramide (REGLAN) injection 10 mg, 10 mg, Intravenous, QID, Theodis Blaze, MD, 10 mg at 02/18/13 1626;  metoprolol (LOPRESSOR) injection 5 mg, 5 mg, Intravenous, Q8H PRN, Charlynne Cousins, MD metoprolol succinate (TOPROL-XL) 24 hr tablet 50 mg, 50 mg, Oral, Daily, Charlynne Cousins, MD,  50 mg at 02/18/13 0956;  mometasone-formoterol (DULERA) 100-5 MCG/ACT inhaler 2 puff, 2 puff, Inhalation, BID, Charlynne Cousins, MD, 2 puff at 02/18/13 [phone];  oxyCODONE (Oxy IR/ROXICODONE) immediate release tablet 5-10 mg, 5-10 mg, Per Tube, Q6H PRN, Charlynne Cousins, MD, 10 mg at 02/15/13 0350 pantoprazole (PROTONIX) injection 40 mg, 40 mg, Intravenous, Q12H, Charlynne Cousins, MD, 40 mg at 02/18/13 0956;  potassium chloride 20 MEQ/15ML (10%) liquid 20 mEq, 20 mEq, Per Tube, TID, Venetia Maxon Rama, MD, 20 mEq at 02/18/13 1613;  promethazine (PHENERGAN) injection 25 mg, 25 mg, Intravenous, Q6H PRN, Charlynne Cousins, MD, 25  mg at 02/18/13 0233;  promethazine (PHENERGAN) suppository 25 mg, 25 mg, Rectal, Q6H PRN, Charlynne Cousins, MD promethazine (PHENERGAN) tablet 25 mg, 25 mg, Per Tube, Q4H PRN, Lezlie Octave Black, NP;  sucralfate (CARAFATE) 1 GM/10ML suspension 1 g, 1 g, Oral, TID WC & HS, Charlynne Cousins, MD, 1 g at 02/18/13 1613 Allergies  Allergen Reactions  . Penicillins Hives  . Cefadroxil     Not sure of effects-listed as allergy on list from home  . Compazine Hives    Tolerates promethazine  . Darvocet (Propoxyphene-Acetaminophen) Other (See Comments)    UNKNOWN  . Nsaids Hives  . Zofran (Ondansetron) Hives     Objective:     BP 104/84  Pulse 90  Temp(Src) 97.8 F (36.6 C) (Oral)  Resp 16  Ht 5\' 5"  (1.651 m)  Wt 63.1 kg (139 lb 1.8 oz)  BMI 23.15 kg/m2  SpO2 98%  She is in no distress  Heart regular rhythm no murmurs  Lungs clear  Abdomen: Bowel sounds normal, soft, nontender  Laboratory No components found with this basename: d1      Assessment:     Vomiting  Hematemesis  Obstruction of distal esophagus, rule out stricture versus achalasia       Plan:     Proceed with EGD to evaluate upper GI tract. After this and depending on the findings we will consider esophageal manometry.

## 2013-02-18 NOTE — Progress Notes (Signed)
[facility] PROGRESS NOTE  [name] [id number] DOB: 12-25-1970 DOA: 02/12/2013 PCP: [name], MD  Brief narrative: [name] is an 42 y.o. female with a PMH of poorly controlled diabetes, diabetic gastroparesis status post PEG tube, hypertension with multiple prior hospitalizations for treatment of gastroparesis flare was admitted on 02/12/2013 with nausea, vomiting and epigastric pain consistent with her usual gastroparesis flare.  Assessment/Plan: Principal Problem:   Intractable nausea and vomiting secondary to gastroparesis -Secondary to gastroparesis flare versus achalasia, status post esophagram 02/18/13. -NPO. -Continue Reglan. Continue antinausea medications as needed. -Refusing tube feeding.  Encouraged to allow RN to administer. Active Problems:   Hypertension -Controlled on Norvasc and metoprolol.    DM (diabetes mellitus), type 2, uncontrolled -Hemoglobin A1c 8.9% on 02/06/2013. -Seen by diabetes coordinator. -Lantus currently on hold. CBGs 61-141.   Hypokalemia -Continue to supplement.   Hyponatremia -Likely secondary to dehydration. Resolved with hydration.   Abdominal pain -Patient has some element of chronic pain and is opiate dependent.   Dysphasia -Speech therapy evaluation requested. Patient complains of feeling of food being stuck in her esophagus.   History of solitary pulmonary nodule -Reports a pulmonary nodule was discovered on imaging at [facility]. Not seen on follow up imaging.   Diabetic neuropathy -Continue gabapentin.   Code Status: Full. Family Communication: None at bedside. Disposition Plan: Home when stable.   Medical Consultants:  None.  Other Consultants:  Diabetes coordinator  Anti-infectives:  None.  HPI/Subjective: [name] is having ongoing abdominal pain and wants her pain medication increased.  No active vomiting, but has been having difficulty handling secretions.  Objective: Filed Vitals:    02/17/13 1313 02/17/13 2112 02/18/13 0438 02/18/13 0731  BP: 128/91 117/95 155/97   Pulse: 95 92 79   Temp: 97.4 F (36.3 C) 97.8 F (36.6 C) 97.5 F (36.4 C)   TempSrc: Oral Oral Oral   Resp: 18 16 15    Height:      Weight:      SpO2: 100% 100% 100% 99%    Intake/Output Summary (Last 24 hours) at 02/18/13 0804 Last data filed at 02/17/13 1900  Gross per 24 hour  Intake  332.5 ml  Output   2100 ml  Net -1767.5 ml    Exam: Gen:  NAD Cardiovascular:  RRR, No M/R/G Respiratory:  Lungs CTAB Gastrointestinal:  Abdomen soft, NT/ND, + BS Extremities:  No C/E/C  Data Reviewed: Basic Metabolic Panel:  Recent Labs Lab 02/12/13 1420 02/13/13 0200 02/14/13 0645 02/15/13 0400 02/16/13 0600 02/17/13 0606  NA  --  131* 132* 133* 133* 136  K  --  4.3 3.4* 3.7 3.4* 3.4*  CL  --  97 98 100 99 98  CO2  --  25 25 26 26 27   GLUCOSE  --  143* 72 133* 139* 85  BUN  --  11 12 7  4* 3*  CREATININE  --  0.81 0.73 0.61 0.59 0.52  CALCIUM  --  8.9 8.7 8.8 8.6 9.3  MG 1.2*  --   --   --   --   --    GFR Estimated Creatinine Clearance: 81.6 ml/min (by C-G formula based on Cr of 0.52). Liver Function Tests:  Recent Labs Lab 02/12/13 0818 02/13/13 0200  AST 16 16  ALT 13 10  ALKPHOS 123* 104  BILITOT 0.2* 0.2*  PROT 7.5 6.5  ALBUMIN 3.1* 2.5*    Recent Labs Lab 02/12/13 0818  LIPASE 25   Coagulation profile  Recent Labs Lab 02/13/13 0443  INR 0.89    CBC:  Recent Labs Lab 02/12/13 0818 02/13/13 0443 02/14/13 0645 02/15/13 0400 02/16/13 0600 02/17/13 0606  WBC 9.9 9.6 7.7 6.4 6.9 7.6  NEUTROABS 7.9*  --   --   --   --   --   HGB 13.5 11.7* 11.1* 10.7* 9.5* 10.7*  HCT 39.0 34.2* 33.0* 32.0* 28.4* 31.8*  MCV 86.5 87.7 88.7 88.6 88.8 87.4  PLT 213 175 161 184 179 221   CBG:  Recent Labs Lab 02/17/13 1629 02/17/13 2002 02/18/13 02/18/13 0358 02/18/13 0540  GLUCAP 141* 137* 91 61* 109*   Microbiology Recent Results (from the past 240 hour(s))   URINE CULTURE     Status: None   Collection Time    02/12/13 11:03 AM      Result Value Range Status   Specimen Description URINE, CLEAN CATCH   Final   Special Requests NONE   Final   Culture  Setup Time 02/12/2013 14:52   Final   Colony Count 30,000 COLONIES/ML   Final   Culture     Final   Value: Multiple bacterial morphotypes present, none predominant. Suggest appropriate recollection if clinically indicated.   Report Status 02/13/2013 FINAL   Final  CULTURE, BLOOD (ROUTINE X 2)     Status: None   Collection Time    02/12/13  2:20 PM      Result Value Range Status   Specimen Description BLOOD LEFT HAND   Final   Special Requests BOTTLES DRAWN AEROBIC ONLY 2CC   Final   Culture  Setup Time 02/12/2013 21:11   Final   Culture     Final   Value:        BLOOD CULTURE RECEIVED NO GROWTH TO DATE CULTURE WILL BE HELD FOR 5 DAYS BEFORE ISSUING A FINAL NEGATIVE REPORT   Report Status PENDING   Incomplete  CULTURE, BLOOD (ROUTINE X 2)     Status: None   Collection Time    02/13/13  2:00 AM      Result Value Range Status   Specimen Description BLOOD LEFT HAND   Final   Special Requests BOTTLES DRAWN AEROBIC ONLY  2ML   Final   Culture  Setup Time 02/13/2013 09:26   Final   Culture     Final   Value:        BLOOD CULTURE RECEIVED NO GROWTH TO DATE CULTURE WILL BE HELD FOR 5 DAYS BEFORE ISSUING A FINAL NEGATIVE REPORT   Report Status PENDING   Incomplete     Procedures and Diagnostic Studies: Ct Abdomen Pelvis Wo Contrast  02/06/2013   *RADIOLOGY REPORT*  Clinical Data: Abdominal discomfort, gastroesophageal reflux, diabetic gastroparesis  CT ABDOMEN AND PELVIS WITHOUT CONTRAST  Technique:  Multidetector CT imaging of the abdomen and pelvis was performed following the standard protocol without intravenous contrast.  Comparison: 12/24/2012  Findings: Lung bases clear.  Normal heart size.  Circumferential wall thickening of the distal esophagus with an associated small hiatal hernia, this  can be seen with chronic esophagitis/reflux. No pericardial or pleural effusion.  Abdomen:  The balloon retention gastrojejunostomy is coiled the stomach. Prior cholecystectomy noted.  Liver, biliary system, pancreas, spleen, adrenal glands, and kidneys demonstrate no acute process by noncontrast imaging.  Negative for bowel obstruction, dilatation, ileus pattern, or free air.  No hydronephrosis or obstructing urinary tract calculus demonstrated.  No evidence of hemorrhage, adenopathy, fluid collection, or abscess.  Atherosclerosis of the  aorta without aneurysm.  Postop changes of the bowel in the left upper quadrant noted.  Pelvis:  Moderate distention of the urinary bladder.  Prior hysterectomy evident.  No pelvic free fluid, fluid collection, hemorrhage, abscess, inguinal abnormality, or hernia.  Pelvic calcifications noted consistent with venous phleboliths.  Postop changes of the lumbar spine.  IMPRESSION: Jejunal feeding tube coiled in the stomach.  Thickened distal esophageal wall with an associated hiatal hernia, suspect chronic reflux/esophagitis.  Prior cholecystectomy and hysterectomy  No acute intra-abdominal or pelvic process by noncontrast imaging.  No obstructing urinary tract calculus  Moderate to severe bladder distention   Original Report Authenticated By: Jerilynn Mages. Annamaria Boots, M.D.   US Abdomen Complete  02/06/2013   *RADIOLOGY REPORT*  Clinical Data:  Elevated alkaline phosphatase level, hypertension, diabetes  COMPLETE ABDOMINAL ULTRASOUND  Comparison:  CT abdomen pelvis of 02/06/2013  Findings:  Gallbladder:  The gallbladder has previously been resected.  Common bile duct:  The common bile duct is normal measuring 4.6 mm in diameter.  Liver:  The liver is unremarkable with no focal abnormality noted. No ductal dilatation.  IVC:  Appears normal.  Pancreas:  The midportion of the pancreas appears normal with the head and tail obscured by bowel gas.  Spleen:  Spleen is normal measuring 5.3 cm sagittally.   Right Kidney:  No hydronephrosis is seen.  The right kidney measures 13.3 cm sagittally and does appear to be somewhat echogenic.  Chronic renal medical disease is a consideration.  Left Kidney:  No hydronephrosis is seen.  The left kidney measures 14.2 cm sagittally and echogenicity of the renal parenchyma appears more normal.  Abdominal aorta:  The abdominal aorta is normal in caliber.  IMPRESSION:  1.  Somewhat echogenic right renal parenchyma of questionable significance.  Correlate with renal laboratory values.  No hydronephrosis. 2.  The liver is unremarkable. 3.  Prior cholecystectomy   Original Report Authenticated By: Ivar Drape, M.D.   Ir Fluoro Guide Cv Line Right  02/14/2013   *RADIOLOGY REPORT*  Indication: Poor venous access  ULTRASOUND AND FLUORSCOPIC GUIDED PICC LINE INSERTION  Intravenous Medications: None  Contrast: None  Fluoroscopy Time:  6-seconds  Complications: None immediate  Technique / Findings:  The procedure, risks, benefits, and alternatives were explained to the patient and informed written consent was obtained.  A timeout was performed prior to the initiation of the procedure.  The right upper extremity was prepped with chlorhexidine in a sterile fashion, and a sterile drape was applied covering the operative field.  Maximum barrier sterile technique with sterile gowns and gloves were used for the procedure.  A timeout was performed prior to the initiation of the procedure.  Local anesthesia was provided with 1% lidocaine.  Under direct ultrasound guidance, the rightbrachialvein was accessed with a micropuncture kit after the overlying soft tissues were anesthetized with 1% lidocaine.  An ultrasound image was saved for documentation purposes.  A guidewire was advanced to the level of the superior caval-atrial junction for measurement purposes and the PICC line was cut to length.  A peel-away sheath was placed and a 36 cm, 5 [place], dual lumen was inserted to level of the superior  caval-atrial junction.  A post procedure spot fluoroscopic was obtained.  The catheter easily aspirated and flushed and was sutured in place.  A dressing was placed.  The patient tolerated the procedure well without immediate post procedural complication.  Impression:  Successful ultrasound and fluoroscopic guided placement of a right brachial vein approach, 36  cm, 5 French,dual lumen PICC with tip at the superior caval-atrial junction.  The PICC line is ready for immediate use.   Original Report Authenticated By: Jake Seats, MD   Ir Fluoro Guide Cv Line Right  02/08/2013   *RADIOLOGY REPORT*  Clinical Data: Nausea, vomiting, abdominal pain, diabetic gastroparesis and poor IV access.  PICC LINE PLACEMENT WITH ULTRASOUND AND FLUOROSCOPIC  GUIDANCE  Fluoroscopy Time: 18 seconds  The right arm was prepped with chlorhexidine, draped in the usual sterile fashion using maximum barrier technique (cap and mask, sterile gown, sterile gloves, large sterile sheet, hand hygiene and cutaneous antisepsis) and infiltrated locally with 1% Lidocaine.  Ultrasound demonstrated patency of the right brachial vein, and this was documented with an image.  Under real-time ultrasound guidance, this vein was accessed with a 21 gauge micropuncture needle and image documentation was performed.  The needle was exchanged over a guidewire for a peel-away sheath through which a 5 [place] dual lumen PICC trimmed to 39 cm was advanced, positioned with its tip at the lower SVC/right atrial junction.  Fluoroscopy during the procedure and fluoro spot radiograph confirms appropriate catheter position.  The catheter was flushed, secured to the skin with Prolene sutures, and covered with a sterile dressing.  Complications:  None  IMPRESSION: Successful right arm PICC line placement with ultrasound and fluoroscopic guidance.  The catheter is ready for use.   Original Report Authenticated By: Aletta Edouard, M.D.   Ir Replc Duoden/jejuno Tube Percut  W/fluoro  02/07/2013   *RADIOLOGY REPORT*  Clinical Data: Malpositioned jejunal feeding tube coiled in the stomach.  FLUOROSCOPIC REPLACEMENT OF THE 18-FRENCH JEJUNAL FEEDING TUBE  Date:  02/07/2013 10:45:00  Radiologist:  M. Daryll Brod, M.D.  Medications:  None.  Guidance:  Fluoroscopic  Fluoroscopy time:  6 minutes  Sedation time:  None.  Contrast volume:  10 ml [id number]  Complications:  No immediate  PROCEDURE/FINDINGS:  Informed consent was obtained from the patient following explanation of the procedure, risks, benefits and alternatives. The patient understands, agrees and consents for the procedure. All questions were addressed.  A time out was performed.  Maximal barrier sterile technique utilized including caps, mask, sterile gowns, sterile gloves, large sterile drape, hand hygiene, and betadine  Under sterile conditions, the existing malpositioned feeding tube was removed.  A C2 catheter and a glidewire were utilized to manipulate the access into the proximal jejunum.  Over the guide wire, a new 18-French balloon retention jejunostomy was advanced. Tip position confirmed the proximal jejunum with a contrast injection.  Images obtained for documentation.  Retention balloon inflated with 10 ml saline and retracted against anterior gastric wall.  No immediate complication.  The patient tolerated the procedure well.  IMPRESSION: Successful fluoroscopic replacement of the 18-French jejunostomy. Tip in the proximal jejunum ready for use   Original Report Authenticated By: Jerilynn Mages. Annamaria Boots, M.D.   Ir US Guide Vasc Access Left  02/14/2013   *RADIOLOGY REPORT*  Indication: Poor venous access  ULTRASOUND AND FLUORSCOPIC GUIDED PICC LINE INSERTION  Intravenous Medications: None  Contrast: None  Fluoroscopy Time:  6-seconds  Complications: None immediate  Technique / Findings:  The procedure, risks, benefits, and alternatives were explained to the patient and informed written consent was obtained.  A timeout was  performed prior to the initiation of the procedure.  The right upper extremity was prepped with chlorhexidine in a sterile fashion, and a sterile drape was applied covering the operative field.  Maximum barrier sterile technique with sterile gowns  and gloves were used for the procedure.  A timeout was performed prior to the initiation of the procedure.  Local anesthesia was provided with 1% lidocaine.  Under direct ultrasound guidance, the rightbrachialvein was accessed with a micropuncture kit after the overlying soft tissues were anesthetized with 1% lidocaine.  An ultrasound image was saved for documentation purposes.  A guidewire was advanced to the level of the superior caval-atrial junction for measurement purposes and the PICC line was cut to length.  A peel-away sheath was placed and a 36 cm, 5 [place], dual lumen was inserted to level of the superior caval-atrial junction.  A post procedure spot fluoroscopic was obtained.  The catheter easily aspirated and flushed and was sutured in place.  A dressing was placed.  The patient tolerated the procedure well without immediate post procedural complication.  Impression:  Successful ultrasound and fluoroscopic guided placement of a right brachial vein approach, 36 cm, 5 French,dual lumen PICC with tip at the superior caval-atrial junction.  The PICC line is ready for immediate use.   Original Report Authenticated By: Jake Seats, MD   Ir US Guide Vasc Access Right  02/08/2013   *RADIOLOGY REPORT*  Clinical Data: Nausea, vomiting, abdominal pain, diabetic gastroparesis and poor IV access.  PICC LINE PLACEMENT WITH ULTRASOUND AND FLUOROSCOPIC  GUIDANCE  Fluoroscopy Time: 18 seconds  The right arm was prepped with chlorhexidine, draped in the usual sterile fashion using maximum barrier technique (cap and mask, sterile gown, sterile gloves, large sterile sheet, hand hygiene and cutaneous antisepsis) and infiltrated locally with 1% Lidocaine.  Ultrasound  demonstrated patency of the right brachial vein, and this was documented with an image.  Under real-time ultrasound guidance, this vein was accessed with a 21 gauge micropuncture needle and image documentation was performed.  The needle was exchanged over a guidewire for a peel-away sheath through which a 5 [place] dual lumen PICC trimmed to 39 cm was advanced, positioned with its tip at the lower SVC/right atrial junction.  Fluoroscopy during the procedure and fluoro spot radiograph confirms appropriate catheter position.  The catheter was flushed, secured to the skin with Prolene sutures, and covered with a sterile dressing.  Complications:  None  IMPRESSION: Successful right arm PICC line placement with ultrasound and fluoroscopic guidance.  The catheter is ready for use.   Original Report Authenticated By: Aletta Edouard, M.D.   Dg Chest Portable 1 View  01/19/2013   *RADIOLOGY REPORT*  Clinical Data: Status post central venous catheter placement  PORTABLE CHEST - 1 VIEW  Comparison: 01/19/2013  Findings: Left IJ catheter has been placed.  The tip is in the right atrium.  Heart size is normal.  No pleural effusion or edema. No airspace consolidation identified.  No pneumothorax identified.  IMPRESSION:  1.  Left IJ catheter tip is in the right atrium.   Original Report Authenticated By: Kerby Moors, M.D.   Dg Abd Acute W/chest  02/12/2013   *RADIOLOGY REPORT*  Clinical Data: Nausea and vomiting with distention.  ACUTE ABDOMEN SERIES (ABDOMEN 2 VIEW & CHEST 1 VIEW)  Comparison: None.  Findings: Normal heart size with clear lung fields.  No bony abnormality.  No free air.  Jejunal tube has been injected with dilute contrast and fills the proximal jejunum.  There is no bowel obstruction.  Surgical clips right upper quadrant. Contrast material from a separate procedure opacifies the colon.  Osseous structures unremarkable.  Prior lumbar fusion.  IMPRESSION: No evidence for bowel obstruction.  No  active  cardiopulmonary disease.  Jejunal tube appears positioned within the proximal small bowel.   Original Report Authenticated By: Rolla Flatten, M.D.   Dg Abd Acute W/chest  02/06/2013   *RADIOLOGY REPORT*  Clinical Data: Mid abdominal pain and vomiting for 24 hours.  ACUTE ABDOMEN SERIES (ABDOMEN 2 VIEW & CHEST 1 VIEW)  Comparison: Chest 01/19/2013.  Abdominal series 01/19/2013.  Findings: Slightly shallow inspiration. The heart size and pulmonary vascularity are normal. The lungs appear clear and expanded without focal air space disease or consolidation. No blunting of the costophrenic angles.  Central perihilar interstitial changes suggest chronic bronchitis.  No pneumothorax. Mediastinal contours appear intact.  Scattered gas and stool in the colon.  No small or large bowel distension.  Postoperative changes in the left abdomen consistent with bowel staples. Surgical clips in the right upper quadrant.  No free air in the abdomen.  No abnormal air fluid levels.  Calcified phleboliths in the pelvis.  No radiopaque stones.  Postoperative changes in the lumbar spine.  IMPRESSION: No evidence of active pulmonary disease.  Probable chronic bronchitic changes.  Nonobstructive bowel gas pattern.   Original Report Authenticated By: Lucienne Capers, M.D.   Dg Abd Acute W/chest  01/19/2013   *RADIOLOGY REPORT*  Clinical Data: Abdominal pain.  Nausea and vomiting  ACUTE ABDOMEN SERIES (ABDOMEN 2 VIEW & CHEST 1 VIEW)  Comparison: 01/18/2013  Findings: Normal heart size.  No pleural effusion or edema identified.  Percutaneous gastrojejunostomy tube is identified. The bowel gas pattern appears nonobstructive.  There is enteric contrast material noted within the colon.  Cholecystectomy clips are present in the right upper quadrant.  IMPRESSION:  1.  Nonobstructive bowel gas pattern. 2.  Gastrojejunostomy tube in place.   Original Report Authenticated By: Kerby Moors, M.D.    Scheduled Meds: . amLODipine  10 mg Oral Daily   . gabapentin  600 mg Per Tube TID  . insulin aspart  0-9 Units Subcutaneous Q4H  . metoCLOPramide (REGLAN) injection  10 mg Intravenous QID  . metoprolol succinate  50 mg Oral Daily  . mometasone-formoterol  2 puff Inhalation BID  . pantoprazole (PROTONIX) IV  40 mg Intravenous Q12H  . sucralfate  1 g Oral TID WC & HS   Continuous Infusions: . 0.9 % NaCl with KCl 40 mEq / L 50 mL/hr at 02/17/13 1709  . feeding supplement (OSMOLITE 1.2 CAL) Stopped (02/12/13 1525)    Time spent: 25 minutes  LOS: 6 days   [facility] Pager [phone].   *Please note that the hospitalists switch teams on Wednesdays. Please call the flow manager at [phone] if you are having difficulty reaching the hospitalist taking care of this patient as she can update you and provide the most up-to-date pager number of provider caring for the patient. If 8PM-8AM, please contact night-coverage at [email or web address], password [facility]  02/18/2013, 8:04 AM

## 2013-02-19 ENCOUNTER — Inpatient Hospital Stay (HOSPITAL_COMMUNITY): Payer: Medicare Other

## 2013-02-19 ENCOUNTER — Encounter (HOSPITAL_COMMUNITY)
Admission: EM | Disposition: A | Discharge status: Nursing Home (Includes ICF) | Payer: Self-pay | Source: Home / Self Care | Attending: Internal Medicine

## 2013-02-19 ENCOUNTER — Encounter (HOSPITAL_COMMUNITY): Payer: Self-pay

## 2013-02-19 HISTORY — PX: ESOPHAGOGASTRODUODENOSCOPY (EGD) WITH ESOPHAGEAL DILATION: SHX5812

## 2013-02-19 HISTORY — PX: BALLOON DILATION: SHX5330

## 2013-02-19 LAB — GLUCOSE, CAPILLARY
Glucose-Capillary: 104 mg/dL — ABNORMAL HIGH (ref 70–99)
Glucose-Capillary: 71 mg/dL (ref 70–99)
Glucose-Capillary: 73 mg/dL (ref 70–99)
Glucose-Capillary: 92 mg/dL (ref 70–99)

## 2013-02-19 LAB — CULTURE, BLOOD (ROUTINE X 2)

## 2013-02-19 SURGERY — ESOPHAGOGASTRODUODENOSCOPY (EGD) WITH ESOPHAGEAL DILATION
Anesthesia: Moderate Sedation

## 2013-02-19 MED ORDER — FENTANYL CITRATE 0.05 MG/ML IJ SOLN
INTRAMUSCULAR | Status: AC
  Filled 2013-02-19: qty 2

## 2013-02-19 MED ORDER — SODIUM CHLORIDE 0.9 % IV SOLN
INTRAVENOUS | Status: DC
  Administered 2013-02-19: 500 mL via INTRAVENOUS

## 2013-02-19 MED ORDER — MIDAZOLAM HCL 10 MG/2ML IJ SOLN
INTRAMUSCULAR | Status: AC
  Filled 2013-02-19: qty 2

## 2013-02-19 MED ORDER — FENTANYL CITRATE 0.05 MG/ML IJ SOLN
INTRAMUSCULAR | Status: DC | PRN
  Administered 2013-02-19 (×3): 25 ug via INTRAVENOUS

## 2013-02-19 MED ORDER — ALTEPLASE 2 MG IJ SOLR
2.0000 mg | Freq: Once | INTRAMUSCULAR | Status: AC
  Administered 2013-02-19: 2 mg
  Filled 2013-02-19: qty 2

## 2013-02-19 MED ORDER — DEXTROSE 50 % IV SOLN: 25.0000 mL | Freq: Once | INTRAVENOUS | Status: AC | PRN

## 2013-02-19 MED ORDER — MIDAZOLAM HCL 10 MG/2ML IJ SOLN
INTRAMUSCULAR | Status: DC | PRN
  Administered 2013-02-19 (×3): 2 mg via INTRAVENOUS

## 2013-02-19 NOTE — Progress Notes (Signed)
Patient transported to endoscopy. Per endoscopy transport staff patient insisted on taking her cell phone down to the procedural area with her.

## 2013-02-19 NOTE — Op Note (Signed)
[facility] [place], [id number]   ENDOSCOPY PROCEDURE REPORT  PATIENT: [name], [name]  MR#: [id number] BIRTHDATE: 1970/12/18 , 42  yrs. old GENDER: Female ENDOSCOPIST: Acquanetta Sit, MD REFERRED BY: PROCEDURE DATE:  02/19/2013 PROCEDURE:   upper endoscopy ASA CLASS: 3 INDICATIONS: dysphagia, abnormal barium swallow MEDICATIONS: fentanyl 75 mcg IV, Versed 6 mg IV TOPICAL ANESTHETIC: Cetacaine spray  DESCRIPTION OF PROCEDURE:   After the risks benefits and alternatives of the procedure were thoroughly explained, informed consent was obtained.  The Pentax Gastroscope [id number]  endoscope was introduced through the mouth and advanced to the stomach      , limited by Without limitations.   The instrument was slowly withdrawn as the mucosa was fully examined.      FINDINGS:  Esophagus: There is no evidence of an esophageal stricture. The esophagogastric junction is photographed on image 004. There is no restriction to passage of the scope into the stomach.  Stomach: A jejunal feeding tube is noted and there is quite a lot of retained gastric feeding. The scope was not advanced to the duodenum.  COMPLICATIONS:none  ENDOSCOPIC IMPRESSION:#1. Gastroparesis  #2. No evidence of distal esophageal stricture. I see nothing on this exam to explain her dysphagia.  #3. There seems to be a lot of retained feeding in her stomach and there was a lot of coiled feeding tube in the stomach itself. This would make me wonder whether or not the jejunal feeding tube has flipped back into the stomach and needs to be repositioned   RECOMMENDATIONS:obtain abdominal x-ray for positioning of feeding tube. Continue Reglan. Consider esophageal manometry should symptoms of dysphagia persist. This could be accomplished as an outpatient.   REPEAT EXAM:   _______________________________ Lorrin MaisAcquanetta Sit, MD 02/19/2013 2:15 PM       PATIENT NAME:  [name], [name] MR#: [id number]

## 2013-02-19 NOTE — Progress Notes (Signed)
[facility] PROGRESS NOTE  [name] [id number] DOB: Apr 14, 1971 DOA: 02/12/2013 PCP: [name], MD  Brief narrative: [name] is an 42 y.o. female with a PMH of poorly controlled diabetes, diabetic gastroparesis status post PEG tube, hypertension with multiple prior hospitalizations for treatment of gastroparesis flare was admitted on 02/12/2013 with nausea, vomiting and epigastric pain consistent with her usual gastroparesis flare. During the course of her hospitalization however, she complained of esophageal dysphasia and an esophagram confirmed a distal esophageal abnormality, possibly achalasia. GI is now following her with plans to perform an upper endoscopy today.  Assessment/Plan: Principal Problem:   Intractable nausea and vomiting secondary to gastroparesis versus achalasia -Secondary to gastroparesis flare versus achalasia, status post esophagram 02/18/13. -NPO. -Continue Reglan. Continue antinausea medications as needed. -Refusing tube feeding.  Encouraged to allow RN to administer. -Status post evaluation by Dr. Penelope Coop with plans to proceed with upper endoscopy today. Active Problems:   Hypertension -Controlled on Norvasc and metoprolol.    DM (diabetes mellitus), type 2, uncontrolled -Hemoglobin A1c 8.9% on 02/06/2013. -Seen by diabetes coordinator. -Lantus currently on hold. CBGs 71-107.   Hypokalemia -Continue to supplement.   Hyponatremia -Likely secondary to dehydration. Resolved with hydration.   Abdominal pain -Patient has some element of chronic pain and is opiate dependent.   Dysphasia -Status post esophagram on 02/18/2013 with findings concerning for achalasia. For upper endoscopy today to further evaluate.   History of solitary pulmonary nodule -Reports a pulmonary nodule was discovered on imaging at [facility]. Not seen on follow up imaging.   Diabetic neuropathy -Continue gabapentin.   Code Status: Full. Family Communication: None at  bedside. Disposition Plan: Home when stable.   Medical Consultants:  Dr. Sandi Mariscal, IR  Dr. Anson Fret, Gastroenterology.  Other Consultants:  Diabetes coordinator  Anti-infectives:  None.  HPI/Subjective: [name] is still complaining of dysphasia and nausea. No active vomiting. No diarrhea. Still with significant abdominal discomfort and pain.  Objective: Filed Vitals:   02/18/13 0731 02/18/13 1414 02/18/13 2211 02/19/13 0615  BP:  104/84 109/65 104/73  Pulse:  90 87 84  Temp:  97.8 F (36.6 C) 98.1 F (36.7 C) 98.1 F (36.7 C)  TempSrc:  Oral Oral Oral  Resp:  16 16 16   Height:      Weight:      SpO2: 99% 98% 100% 100%    Intake/Output Summary (Last 24 hours) at 02/19/13 0802 Last data filed at 02/19/13 [id number]  Gross per 24 hour  Intake 1862.5 ml  Output   1800 ml  Net   62.5 ml    Exam: Gen:  NAD Cardiovascular:  RRR, No M/R/G Respiratory:  Lungs CTAB Gastrointestinal:  Abdomen soft, NT/ND, + BS Extremities:  No C/E/C  Data Reviewed: Basic Metabolic Panel:  Recent Labs Lab 02/12/13 1420 02/13/13 0200 02/14/13 0645 02/15/13 0400 02/16/13 0600 02/17/13 0606  NA  --  131* 132* 133* 133* 136  K  --  4.3 3.4* 3.7 3.4* 3.4*  CL  --  97 98 100 99 98  CO2  --  25 25 26 26 27   GLUCOSE  --  143* 72 133* 139* 85  BUN  --  11 12 7  4* 3*  CREATININE  --  0.81 0.73 0.61 0.59 0.52  CALCIUM  --  8.9 8.7 8.8 8.6 9.3  MG 1.2*  --   --   --   --   --    GFR Estimated Creatinine Clearance: 81.6 ml/min (  by C-G formula based on Cr of 0.52). Liver Function Tests:  Recent Labs Lab 02/12/13 0818 02/13/13 0200  AST 16 16  ALT 13 10  ALKPHOS 123* 104  BILITOT 0.2* 0.2*  PROT 7.5 6.5  ALBUMIN 3.1* 2.5*    Recent Labs Lab 02/12/13 0818  LIPASE 25   Coagulation profile  Recent Labs Lab 02/13/13 0443  INR 0.89    CBC:  Recent Labs Lab 02/12/13 0818 02/13/13 0443 02/14/13 0645 02/15/13 0400 02/16/13 0600 02/17/13 0606  WBC 9.9  9.6 7.7 6.4 6.9 7.6  NEUTROABS 7.9*  --   --   --   --   --   HGB 13.5 11.7* 11.1* 10.7* 9.5* 10.7*  HCT 39.0 34.2* 33.0* 32.0* 28.4* 31.8*  MCV 86.5 87.7 88.7 88.6 88.8 87.4  PLT 213 175 161 184 179 221   CBG:  Recent Labs Lab 02/18/13 2003 02/19/13 0016 02/19/13 0413 02/19/13 0444 02/19/13 0610  GLUCAP 107* 92 71 73 15   Microbiology Recent Results (from the past 240 hour(s))  URINE CULTURE     Status: None   Collection Time    02/12/13 11:03 AM      Result Value Range Status   Specimen Description URINE, CLEAN CATCH   Final   Special Requests NONE   Final   Culture  Setup Time 02/12/2013 14:52   Final   Colony Count 30,000 COLONIES/ML   Final   Culture     Final   Value: Multiple bacterial morphotypes present, none predominant. Suggest appropriate recollection if clinically indicated.   Report Status 02/13/2013 FINAL   Final  CULTURE, BLOOD (ROUTINE X 2)     Status: None   Collection Time    02/12/13  2:20 PM      Result Value Range Status   Specimen Description BLOOD LEFT HAND   Final   Special Requests BOTTLES DRAWN AEROBIC ONLY 2CC   Final   Culture  Setup Time 02/12/2013 21:11   Final   Culture NO GROWTH 5 DAYS   Final   Report Status 02/18/2013 FINAL   Final  CULTURE, BLOOD (ROUTINE X 2)     Status: None   Collection Time    02/13/13  2:00 AM      Result Value Range Status   Specimen Description BLOOD LEFT HAND   Final   Special Requests BOTTLES DRAWN AEROBIC ONLY  2ML   Final   Culture  Setup Time 02/13/2013 09:26   Final   Culture     Final   Value:        BLOOD CULTURE RECEIVED NO GROWTH TO DATE CULTURE WILL BE HELD FOR 5 DAYS BEFORE ISSUING A FINAL NEGATIVE REPORT   Report Status PENDING   Incomplete     Procedures and Diagnostic Studies: Ct Abdomen Pelvis Wo Contrast  02/06/2013   *RADIOLOGY REPORT*  Clinical Data: Abdominal discomfort, gastroesophageal reflux, diabetic gastroparesis  CT ABDOMEN AND PELVIS WITHOUT CONTRAST  Technique:  Multidetector  CT imaging of the abdomen and pelvis was performed following the standard protocol without intravenous contrast.  Comparison: 12/24/2012  Findings: Lung bases clear.  Normal heart size.  Circumferential wall thickening of the distal esophagus with an associated small hiatal hernia, this can be seen with chronic esophagitis/reflux. No pericardial or pleural effusion.  Abdomen:  The balloon retention gastrojejunostomy is coiled the stomach. Prior cholecystectomy noted.  Liver, biliary system, pancreas, spleen, adrenal glands, and kidneys demonstrate no acute process by noncontrast imaging.  Negative for bowel obstruction, dilatation, ileus pattern, or free air.  No hydronephrosis or obstructing urinary tract calculus demonstrated.  No evidence of hemorrhage, adenopathy, fluid collection, or abscess.  Atherosclerosis of the aorta without aneurysm.  Postop changes of the bowel in the left upper quadrant noted.  Pelvis:  Moderate distention of the urinary bladder.  Prior hysterectomy evident.  No pelvic free fluid, fluid collection, hemorrhage, abscess, inguinal abnormality, or hernia.  Pelvic calcifications noted consistent with venous phleboliths.  Postop changes of the lumbar spine.  IMPRESSION: Jejunal feeding tube coiled in the stomach.  Thickened distal esophageal wall with an associated hiatal hernia, suspect chronic reflux/esophagitis.  Prior cholecystectomy and hysterectomy  No acute intra-abdominal or pelvic process by noncontrast imaging.  No obstructing urinary tract calculus  Moderate to severe bladder distention   Original Report Authenticated By: Jerilynn Mages. Annamaria Boots, M.D.   US Abdomen Complete  02/06/2013   *RADIOLOGY REPORT*  Clinical Data:  Elevated alkaline phosphatase level, hypertension, diabetes  COMPLETE ABDOMINAL ULTRASOUND  Comparison:  CT abdomen pelvis of 02/06/2013  Findings:  Gallbladder:  The gallbladder has previously been resected.  Common bile duct:  The common bile duct is normal measuring 4.6 mm  in diameter.  Liver:  The liver is unremarkable with no focal abnormality noted. No ductal dilatation.  IVC:  Appears normal.  Pancreas:  The midportion of the pancreas appears normal with the head and tail obscured by bowel gas.  Spleen:  Spleen is normal measuring 5.3 cm sagittally.  Right Kidney:  No hydronephrosis is seen.  The right kidney measures 13.3 cm sagittally and does appear to be somewhat echogenic.  Chronic renal medical disease is a consideration.  Left Kidney:  No hydronephrosis is seen.  The left kidney measures 14.2 cm sagittally and echogenicity of the renal parenchyma appears more normal.  Abdominal aorta:  The abdominal aorta is normal in caliber.  IMPRESSION:  1.  Somewhat echogenic right renal parenchyma of questionable significance.  Correlate with renal laboratory values.  No hydronephrosis. 2.  The liver is unremarkable. 3.  Prior cholecystectomy   Original Report Authenticated By: Ivar Drape, M.D.   Ir Fluoro Guide Cv Line Right  02/14/2013   *RADIOLOGY REPORT*  Indication: Poor venous access  ULTRASOUND AND FLUORSCOPIC GUIDED PICC LINE INSERTION  Intravenous Medications: None  Contrast: None  Fluoroscopy Time:  6-seconds  Complications: None immediate  Technique / Findings:  The procedure, risks, benefits, and alternatives were explained to the patient and informed written consent was obtained.  A timeout was performed prior to the initiation of the procedure.  The right upper extremity was prepped with chlorhexidine in a sterile fashion, and a sterile drape was applied covering the operative field.  Maximum barrier sterile technique with sterile gowns and gloves were used for the procedure.  A timeout was performed prior to the initiation of the procedure.  Local anesthesia was provided with 1% lidocaine.  Under direct ultrasound guidance, the rightbrachialvein was accessed with a micropuncture kit after the overlying soft tissues were anesthetized with 1% lidocaine.  An ultrasound  image was saved for documentation purposes.  A guidewire was advanced to the level of the superior caval-atrial junction for measurement purposes and the PICC line was cut to length.  A peel-away sheath was placed and a 36 cm, 5 [place], dual lumen was inserted to level of the superior caval-atrial junction.  A post procedure spot fluoroscopic was obtained.  The catheter easily aspirated and flushed and was sutured in place.  A dressing was placed.  The patient tolerated the procedure well without immediate post procedural complication.  Impression:  Successful ultrasound and fluoroscopic guided placement of a right brachial vein approach, 36 cm, 5 French,dual lumen PICC with tip at the superior caval-atrial junction.  The PICC line is ready for immediate use.   Original Report Authenticated By: Jake Seats, MD   Ir Fluoro Guide Cv Line Right  02/08/2013   *RADIOLOGY REPORT*  Clinical Data: Nausea, vomiting, abdominal pain, diabetic gastroparesis and poor IV access.  PICC LINE PLACEMENT WITH ULTRASOUND AND FLUOROSCOPIC  GUIDANCE  Fluoroscopy Time: 18 seconds  The right arm was prepped with chlorhexidine, draped in the usual sterile fashion using maximum barrier technique (cap and mask, sterile gown, sterile gloves, large sterile sheet, hand hygiene and cutaneous antisepsis) and infiltrated locally with 1% Lidocaine.  Ultrasound demonstrated patency of the right brachial vein, and this was documented with an image.  Under real-time ultrasound guidance, this vein was accessed with a 21 gauge micropuncture needle and image documentation was performed.  The needle was exchanged over a guidewire for a peel-away sheath through which a 5 [place] dual lumen PICC trimmed to 39 cm was advanced, positioned with its tip at the lower SVC/right atrial junction.  Fluoroscopy during the procedure and fluoro spot radiograph confirms appropriate catheter position.  The catheter was flushed, secured to the skin with Prolene sutures,  and covered with a sterile dressing.  Complications:  None  IMPRESSION: Successful right arm PICC line placement with ultrasound and fluoroscopic guidance.  The catheter is ready for use.   Original Report Authenticated By: Aletta Edouard, M.D.   Ir Replc Duoden/jejuno Tube Percut W/fluoro  02/07/2013   *RADIOLOGY REPORT*  Clinical Data: Malpositioned jejunal feeding tube coiled in the stomach.  FLUOROSCOPIC REPLACEMENT OF THE 18-FRENCH JEJUNAL FEEDING TUBE  Date:  02/07/2013 10:45:00  Radiologist:  M. Daryll Brod, M.D.  Medications:  None.  Guidance:  Fluoroscopic  Fluoroscopy time:  6 minutes  Sedation time:  None.  Contrast volume:  10 ml [id number]  Complications:  No immediate  PROCEDURE/FINDINGS:  Informed consent was obtained from the patient following explanation of the procedure, risks, benefits and alternatives. The patient understands, agrees and consents for the procedure. All questions were addressed.  A time out was performed.  Maximal barrier sterile technique utilized including caps, mask, sterile gowns, sterile gloves, large sterile drape, hand hygiene, and betadine  Under sterile conditions, the existing malpositioned feeding tube was removed.  A C2 catheter and a glidewire were utilized to manipulate the access into the proximal jejunum.  Over the guide wire, a new 18-French balloon retention jejunostomy was advanced. Tip position confirmed the proximal jejunum with a contrast injection.  Images obtained for documentation.  Retention balloon inflated with 10 ml saline and retracted against anterior gastric wall.  No immediate complication.  The patient tolerated the procedure well.  IMPRESSION: Successful fluoroscopic replacement of the 18-French jejunostomy. Tip in the proximal jejunum ready for use   Original Report Authenticated By: Jerilynn Mages. Annamaria Boots, M.D.   Ir US Guide Vasc Access Left  02/14/2013   *RADIOLOGY REPORT*  Indication: Poor venous access  ULTRASOUND AND FLUORSCOPIC GUIDED PICC LINE  INSERTION  Intravenous Medications: None  Contrast: None  Fluoroscopy Time:  6-seconds  Complications: None immediate  Technique / Findings:  The procedure, risks, benefits, and alternatives were explained to the patient and informed written consent was obtained.  A timeout was performed prior to the initiation of the  procedure.  The right upper extremity was prepped with chlorhexidine in a sterile fashion, and a sterile drape was applied covering the operative field.  Maximum barrier sterile technique with sterile gowns and gloves were used for the procedure.  A timeout was performed prior to the initiation of the procedure.  Local anesthesia was provided with 1% lidocaine.  Under direct ultrasound guidance, the rightbrachialvein was accessed with a micropuncture kit after the overlying soft tissues were anesthetized with 1% lidocaine.  An ultrasound image was saved for documentation purposes.  A guidewire was advanced to the level of the superior caval-atrial junction for measurement purposes and the PICC line was cut to length.  A peel-away sheath was placed and a 36 cm, 5 [place], dual lumen was inserted to level of the superior caval-atrial junction.  A post procedure spot fluoroscopic was obtained.  The catheter easily aspirated and flushed and was sutured in place.  A dressing was placed.  The patient tolerated the procedure well without immediate post procedural complication.  Impression:  Successful ultrasound and fluoroscopic guided placement of a right brachial vein approach, 36 cm, 5 French,dual lumen PICC with tip at the superior caval-atrial junction.  The PICC line is ready for immediate use.   Original Report Authenticated By: Jake Seats, MD   Ir US Guide Vasc Access Right  02/08/2013   *RADIOLOGY REPORT*  Clinical Data: Nausea, vomiting, abdominal pain, diabetic gastroparesis and poor IV access.  PICC LINE PLACEMENT WITH ULTRASOUND AND FLUOROSCOPIC  GUIDANCE  Fluoroscopy Time: 18 seconds  The  right arm was prepped with chlorhexidine, draped in the usual sterile fashion using maximum barrier technique (cap and mask, sterile gown, sterile gloves, large sterile sheet, hand hygiene and cutaneous antisepsis) and infiltrated locally with 1% Lidocaine.  Ultrasound demonstrated patency of the right brachial vein, and this was documented with an image.  Under real-time ultrasound guidance, this vein was accessed with a 21 gauge micropuncture needle and image documentation was performed.  The needle was exchanged over a guidewire for a peel-away sheath through which a 5 [place] dual lumen PICC trimmed to 39 cm was advanced, positioned with its tip at the lower SVC/right atrial junction.  Fluoroscopy during the procedure and fluoro spot radiograph confirms appropriate catheter position.  The catheter was flushed, secured to the skin with Prolene sutures, and covered with a sterile dressing.  Complications:  None  IMPRESSION: Successful right arm PICC line placement with ultrasound and fluoroscopic guidance.  The catheter is ready for use.   Original Report Authenticated By: Aletta Edouard, M.D.   Dg Chest Portable 1 View  01/19/2013   *RADIOLOGY REPORT*  Clinical Data: Status post central venous catheter placement  PORTABLE CHEST - 1 VIEW  Comparison: 01/19/2013  Findings: Left IJ catheter has been placed.  The tip is in the right atrium.  Heart size is normal.  No pleural effusion or edema. No airspace consolidation identified.  No pneumothorax identified.  IMPRESSION:  1.  Left IJ catheter tip is in the right atrium.   Original Report Authenticated By: Kerby Moors, M.D.   Dg Abd Acute W/chest  02/12/2013   *RADIOLOGY REPORT*  Clinical Data: Nausea and vomiting with distention.  ACUTE ABDOMEN SERIES (ABDOMEN 2 VIEW & CHEST 1 VIEW)  Comparison: None.  Findings: Normal heart size with clear lung fields.  No bony abnormality.  No free air.  Jejunal tube has been injected with dilute contrast and fills the  proximal jejunum.  There is no bowel  obstruction.  Surgical clips right upper quadrant. Contrast material from a separate procedure opacifies the colon.  Osseous structures unremarkable.  Prior lumbar fusion.  IMPRESSION: No evidence for bowel obstruction.  No active cardiopulmonary disease.  Jejunal tube appears positioned within the proximal small bowel.   Original Report Authenticated By: Rolla Flatten, M.D.   Dg Abd Acute W/chest  02/06/2013   *RADIOLOGY REPORT*  Clinical Data: Mid abdominal pain and vomiting for 24 hours.  ACUTE ABDOMEN SERIES (ABDOMEN 2 VIEW & CHEST 1 VIEW)  Comparison: Chest 01/19/2013.  Abdominal series 01/19/2013.  Findings: Slightly shallow inspiration. The heart size and pulmonary vascularity are normal. The lungs appear clear and expanded without focal air space disease or consolidation. No blunting of the costophrenic angles.  Central perihilar interstitial changes suggest chronic bronchitis.  No pneumothorax. Mediastinal contours appear intact.  Scattered gas and stool in the colon.  No small or large bowel distension.  Postoperative changes in the left abdomen consistent with bowel staples. Surgical clips in the right upper quadrant.  No free air in the abdomen.  No abnormal air fluid levels.  Calcified phleboliths in the pelvis.  No radiopaque stones.  Postoperative changes in the lumbar spine.  IMPRESSION: No evidence of active pulmonary disease.  Probable chronic bronchitic changes.  Nonobstructive bowel gas pattern.   Original Report Authenticated By: Lucienne Capers, M.D.   Dg Abd Acute W/chest  01/19/2013   *RADIOLOGY REPORT*  Clinical Data: Abdominal pain.  Nausea and vomiting  ACUTE ABDOMEN SERIES (ABDOMEN 2 VIEW & CHEST 1 VIEW)  Comparison: 01/18/2013  Findings: Normal heart size.  No pleural effusion or edema identified.  Percutaneous gastrojejunostomy tube is identified. The bowel gas pattern appears nonobstructive.  There is enteric contrast material noted within the  colon.  Cholecystectomy clips are present in the right upper quadrant.  IMPRESSION:  1.  Nonobstructive bowel gas pattern. 2.  Gastrojejunostomy tube in place.   Original Report Authenticated By: Kerby Moors, M.D.    Scheduled Meds: . amLODipine  10 mg Oral Daily  . gabapentin  600 mg Per Tube TID  . insulin aspart  0-9 Units Subcutaneous Q4H  . metoCLOPramide (REGLAN) injection  10 mg Intravenous QID  . metoprolol succinate  50 mg Oral Daily  . mometasone-formoterol  2 puff Inhalation BID  . pantoprazole (PROTONIX) IV  40 mg Intravenous Q12H  . potassium chloride  20 mEq Per Tube TID  . sucralfate  1 g Oral TID WC & HS   Continuous Infusions: . 0.9 % NaCl with KCl 40 mEq / L 50 mL/hr at 02/19/13 0615  . feeding supplement (OSMOLITE 1.2 CAL) Stopped (02/12/13 1525)    Time spent: 25 minutes  LOS: 7 days   RAMA,CHRISTINA  [facility] Pager [phone].   *Please note that the hospitalists switch teams on Wednesdays. Please call the flow manager at [phone] if you are having difficulty reaching the hospitalist taking care of this patient as she can update you and provide the most up-to-date pager number of provider caring for the patient. If 8PM-8AM, please contact night-coverage at [email or web address], password [facility]  02/19/2013, 8:02 AM

## 2013-02-19 NOTE — Progress Notes (Signed)
Hypoglycemic Event  CBG: 63  Treatment: D50 IV 25 mL  Symptoms: None  Follow-up CBG: Time:1205 CBG Result:113  Possible Reasons for Event: Inadequate meal intake  Comments/MD notified:    Joaquin Courts  Remember to initiate Hypoglycemia Order Set & complete

## 2013-02-19 NOTE — Progress Notes (Signed)
Hypoglycemic Event  CBG: 60  Treatment: D50 IV 25 mL  Symptoms: None  Follow-up CBG: Time: 1554  CBG Result:104  Possible Reasons for Event: Inadequate meal intake  Comments/MD notified:    Joaquin Courts  Remember to initiate Hypoglycemia Order Set & complete

## 2013-02-19 NOTE — Progress Notes (Signed)
NUTRITION FOLLOW UP  Intervention:   - Recommend, once pt ready to start TF again, initiation of TF of Osmolite 1.2 via J tube start at 97ml/hr increase by 61ml every 8 hours to goal of 50ml/hr. This will provide 1440 calories, 67g protein, 958ml free water meeting 99% estimated calorie needs, 112% estimated protein needs - Diet advancement per MD - Will continue to monitor  Nutrition Dx:   Inadequate oral intake related to inability to eat as evidenced by NPO - ongoing   Goal:   1. No further nausea/vomiting - not met 2. Resume home TF regimen, advancing slowly to goal as tolerated  - not met 3. Advance diet as tolerated to diabetic diet - not met   Monitor:   Weights, labs, diet advancement, TF initation  Assessment:   Pt discussed during multidisciplinary rounds.   Last TF given 7/15, noted pt had been refusing TF after that. Pt had c/o to MD about feeling like food was stuck in her esophagus. 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 Noted plans for EGD.   Met with pt who c/o being tired of having ongoing vomiting. Reports having 2 episodes yesterday and 1 so far today, yellow in color. Noted pt with slightly low potassium. Pt's weight down 3 pounds since admission.   Height: Ht Readings from Last 1 Encounters:  02/12/13 5\' 5"  (1.651 m)    Weight Status:   Wt Readings from Last 1 Encounters:  02/17/13 139 lb 1.8 oz (63.1 kg)    Re-estimated needs:  Kcal: 1450-1650 Protein: 60-70g Fluid: 1.4-1.6L/day  Skin: Intact   Diet Order: NPO   Intake/Output Summary (Last 24 hours) at 02/19/13 1229 Last data filed at 02/19/13 0835  Gross per 24 hour  Intake 1922.5 ml  Output   1800 ml  Net  122.5 ml    Last BM: 7/16   Labs:   Recent Labs Lab 02/12/13 1420  02/15/13 0400 02/16/13 0600 02/17/13 0606  NA  --   < > 133* 133* 136  K  --   < > 3.7 3.4* 3.4*  CL  --   < > 100 99 98  CO2  --   < >  26 26 27   BUN  --   < > 7 4* 3*  CREATININE  --   < > 0.61 0.59 0.52  CALCIUM  --   < > 8.8 8.6 9.3  MG 1.2*  --   --   --   --   GLUCOSE  --   < > 133* 139* 85  < > = values in this interval not displayed.  CBG (last 3)   Recent Labs  02/19/13 0744 02/19/13 1132 02/19/13 1204  GLUCAP 89 63* 113*    Scheduled Meds: . amLODipine  10 mg Oral Daily  . gabapentin  600 mg Per Tube TID  . insulin aspart  0-9 Units Subcutaneous Q4H  . metoCLOPramide (REGLAN) injection  10 mg Intravenous QID  . metoprolol succinate  50 mg Oral Daily  . mometasone-formoterol  2 puff Inhalation BID  . pantoprazole (PROTONIX) IV  40 mg Intravenous Q12H  . sucralfate  1 g Oral TID WC & HS    Continuous Infusions: . 0.9 % NaCl with KCl 40 mEq / L 50 mL/hr at 02/19/13 0615  . feeding supplement (OSMOLITE 1.2 CAL) Stopped (02/12/13 1525)     Mikey College MS, RD, [place] Pager  [phone] After Hours Pager  

## 2013-02-20 ENCOUNTER — Encounter (HOSPITAL_COMMUNITY): Payer: Self-pay | Admitting: Gastroenterology

## 2013-02-20 ENCOUNTER — Inpatient Hospital Stay (HOSPITAL_COMMUNITY): Payer: Medicare Other

## 2013-02-20 LAB — CBC
HCT: 29.3 % — ABNORMAL LOW (ref 36.0–46.0)
MCV: 88.8 fL (ref 78.0–100.0)
Platelets: 212 10*3/uL (ref 150–400)
RBC: 3.3 MIL/uL — ABNORMAL LOW (ref 3.87–5.11)
WBC: 7.2 10*3/uL (ref 4.0–10.5)

## 2013-02-20 LAB — BASIC METABOLIC PANEL
CO2: 25 mEq/L (ref 19–32)
Calcium: 9.2 mg/dL (ref 8.4–10.5)
Chloride: 101 mEq/L (ref 96–112)
Sodium: 134 mEq/L — ABNORMAL LOW (ref 135–145)

## 2013-02-20 LAB — GLUCOSE, CAPILLARY
Glucose-Capillary: 64 mg/dL — ABNORMAL LOW (ref 70–99)
Glucose-Capillary: 74 mg/dL (ref 70–99)
Glucose-Capillary: 70 mg/dL (ref 70–99)
Glucose-Capillary: 129 mg/dL — ABNORMAL HIGH (ref 70–99)

## 2013-02-20 MED ORDER — SUCRALFATE 1 GM/10ML PO SUSP: 1.0000 g | Freq: Three times a day (TID) | ORAL | Status: DC

## 2013-02-20 MED ORDER — METOPROLOL TARTRATE 1 MG/ML IV SOLN: 5.0000 mg | Freq: Three times a day (TID) | INTRAVENOUS | Status: DC | PRN

## 2013-02-20 MED ORDER — AMLODIPINE BESYLATE 10 MG PO TABS: 10.0000 mg | ORAL_TABLET | Freq: Every day | ORAL | Status: DC

## 2013-02-20 MED ORDER — INSULIN ASPART 100 UNIT/ML ~~LOC~~ SOLN: 0.0000 [IU] | Freq: Three times a day (TID) | SUBCUTANEOUS | Status: DC

## 2013-02-20 MED ORDER — IOHEXOL 300 MG/ML  SOLN
40.0000 mL | Freq: Once | INTRAMUSCULAR | Status: AC | PRN
  Administered 2013-02-20: 40 mL

## 2013-02-20 MED ORDER — ATORVASTATIN CALCIUM 40 MG PO TABS: 40.0000 mg | ORAL_TABLET | Freq: Every day | ORAL | Status: DC

## 2013-02-20 MED ORDER — HYDROMORPHONE HCL PF 2 MG/ML IJ SOLN: 2.0000 mg | INTRAMUSCULAR | Status: DC | PRN

## 2013-02-20 MED ORDER — POLYETHYLENE GLYCOL 3350 17 G PO PACK
17.0000 g | PACK | Freq: Three times a day (TID) | ORAL | Status: DC
  Filled 2013-02-20 (×4): qty 1

## 2013-02-20 MED ORDER — BISACODYL 10 MG RE SUPP: 10.0000 mg | Freq: Every day | RECTAL | Status: DC

## 2013-02-20 MED ORDER — PANTOPRAZOLE SODIUM 40 MG IV SOLR: 40.0000 mg | Freq: Two times a day (BID) | INTRAVENOUS | Status: DC

## 2013-02-20 MED ORDER — ZOLPIDEM TARTRATE 10 MG PO TABS: 10.0000 mg | ORAL_TABLET | Freq: Every evening | ORAL | Status: DC | PRN

## 2013-02-20 MED ORDER — METOCLOPRAMIDE HCL 5 MG/ML IJ SOLN: 10.0000 mg | Freq: Four times a day (QID) | INTRAMUSCULAR | Status: DC

## 2013-02-20 MED ORDER — DOCUSATE SODIUM 50 MG/5ML PO LIQD: 100.0000 mg | Freq: Two times a day (BID) | ORAL | Status: DC

## 2013-02-20 MED ORDER — BISACODYL 10 MG RE SUPP
10.0000 mg | Freq: Every day | RECTAL | Status: DC
  Filled 2013-02-20: qty 1

## 2013-02-20 MED ORDER — HEPARIN SOD (PORK) LOCK FLUSH 100 UNIT/ML IV SOLN
INTRAVENOUS | Status: AC
  Administered 2013-02-20: 250 [IU]
  Filled 2013-02-20: qty 5

## 2013-02-20 MED ORDER — GABAPENTIN 300 MG PO CAPS: 600.0000 mg | ORAL_CAPSULE | Freq: Three times a day (TID) | ORAL | Status: DC

## 2013-02-20 MED ORDER — ADULT MULTIVITAMIN W/MINERALS CH: 1.0000 | ORAL_TABLET | Freq: Every day | ORAL | Status: DC

## 2013-02-20 MED ORDER — LORAZEPAM 2 MG/ML IJ SOLN: 0.5000 mg | Freq: Four times a day (QID) | INTRAMUSCULAR | Status: DC | PRN

## 2013-02-20 MED ORDER — PROMETHAZINE HCL 25 MG/ML IJ SOLN: 25.0000 mg | Freq: Four times a day (QID) | INTRAMUSCULAR | Status: DC | PRN

## 2013-02-20 MED ORDER — DOCUSATE SODIUM 100 MG PO CAPS
100.0000 mg | ORAL_CAPSULE | Freq: Two times a day (BID) | ORAL | Status: DC
  Filled 2013-02-20 (×2): qty 1

## 2013-02-20 MED ORDER — DOCUSATE SODIUM 50 MG/5ML PO LIQD
100.0000 mg | Freq: Two times a day (BID) | ORAL | Status: DC
  Filled 2013-02-20 (×2): qty 10

## 2013-02-20 MED ORDER — OXYCODONE HCL 5 MG PO TABS: 5.0000 mg | ORAL_TABLET | Freq: Four times a day (QID) | ORAL | Status: DC | PRN

## 2013-02-20 NOTE — Procedures (Signed)
Successful exchange of the GJ tube.  Tip in small bowel.  Ready to use.

## 2013-02-20 NOTE — Progress Notes (Signed)
Hypoglycemic Event  CBG: 67  Treatment: D50 IV 25 mL  Symptoms: None  Follow-up CBG: Time:1718 CBG Result:129 Possible Reasons for Event: Inadequate meal intake  Comments/MD  Dr Juventino Slovak, Lanny Hurst  Remember to initiate Hypoglycemia Order Set & complete

## 2013-02-20 NOTE — Care Management Note (Signed)
Cm spoke with pt's sister Ross Marcus at [phone] with pt's permission at the bedside. Pt's sister expressed concerns of pt's plan of care. Sister informed that pt's acute symptoms are resolved but pt still requiring pain management. IR to replace J tube. Pt states recently relocated from residence where her and 58 other family members lived. Pt states sister was unable to provide care needed at home. Pt appropriate for LTACH. Pt and sister agreeable to transfer. RN ELSIE informed pt assigned to room 401. RN informed to call report and arrange transport when appropriate. Pt' sister provided with address and contact info for [facility]    [place] Kerryn Tennant,RN,BSN [phone]

## 2013-02-20 NOTE — Care Management Note (Signed)
Per MD's request,Cm spoke [facility] at [phone] concerning patient eligibility for possible LTACH placement. Cm faxed pt's facesheet to [phone]. Confirmation received.    Venita Lick Roxine Whittinghill,RN,BSN [phone]

## 2013-02-20 NOTE — Progress Notes (Signed)
Hypoglycemic Event  CBG: 67  Treatment: D50 IV 25 mL  Symptoms: None  Follow-up CBG: Time:0805 CBG Result:138 Possible Reasons for Event: Inadequate meal intake  Comments/MD notified:dr devine    [name], [name]  Remember to initiate Hypoglycemia Order Set & complete

## 2013-02-20 NOTE — Progress Notes (Signed)
No major complaints. No stricture found in esophagus. PEJ tube coiled in stomach. Will ask IR to reposition into jejunum.

## 2013-02-20 NOTE — Discharge Summary (Addendum)
Physician Discharge Summary  [name] [id number] DOB: [birth year] DOA: 02/12/2013  PCP: [name], MD  Admit date: 02/12/2013 Discharge date: 02/20/2013  Recommendations for Outpatient Follow-up:  Please follow the instructions below: 1. Patient may continue tube feeds as tolerated, patient is using osmolite feeds continuous feeds but may be used as patient tolerates it.  2. Please note that all the medications are either given IV or per tube. Patient continues to be NPO.  3. patient used to be on Lantus 10 units at bedtime but this is currently on hold due to episodes of hypoglycemia. While in hospital we would administer D50 for hypoglycemia. Patient can however continue sliding scale insulin only at this time. Adjust Lantus as CBGs normalize.  4. patient may continue taking Protonix 40 mg IV twice daily, Reglan 10 mg 4 times daily IV, phenergan 25 mg IV every 6 hours as needed  5. for blood pressure patient can continue taking Norvasc 10 mg daily per tube. Use metoprolol 5 mg IV every 8 hours as needed if systolic blood pressures of 130. Hold metoprolol if heart rate is less than 60.  6. patient can continue taking Dilaudid 2 mg IV every 2 hours as needed for severe pain and oxycodone 10 mg every 6 hours as needed for moderate pain   7. throughout the hospital stay patient had very slow recovery. Patient underwent upper endoscopy with findings of coiled G tube which was exchanged by interventional radiology prior to discharge. No evidence of strictures on upper endoscopy.  8. hemoglobin is 9.6 at the time of discharge. Continue to monitor CBC at least every 3 days.  9. renal function remains within normal limits  Discharge Diagnoses:  Principal Problem:   Intractable nausea and vomiting Active Problems:   Gastroparesis   Hypertension   DM (diabetes mellitus), type 2, uncontrolled   Hypokalemia   Hyponatremia   Abdominal pain   Dysphasia   History of solitary pulmonary  nodule   Diabetic neuropathy    Discharge Condition: medically stable for discharge to LTAC  Diet recommendation: tube feeds, Osmolite   History of present illness:  42 y.o. female with a PMH of poorly controlled diabetes, diabetic gastroparesis status post PEG tube, hypertension with multiple prior hospitalizations for treatment of gastroparesis flare was admitted on 02/12/2013 with nausea, vomiting and epigastric pain consistent with her usual gastroparesis flare. During the course of her hospitalization however, she complained of esophageal dysphasia and an esophagram confirmed a distal esophageal abnormality, possibly achalasia. Patient underwent upper endoscopy with no findings of strictures. G-tube is coiled in the abdomen and the plan is for interventional radiology to reposition prior to discharge.  Assessment/Plan:   Principal Problem:  Intractable nausea and vomiting secondary to gastroparesis versus achalasia  -Secondary to gastroparesis flare versus achalasia, status post esophagram 02/18/13.  -NPO. Plan to reposition g tube prior to discharge -Continue Reglan. Continue antinausea medications as needed.   - tube feeds as patient tolerates Active Problems:  Hypertension  -Controlled on Norvasc and metoprolol.  DM (diabetes mellitus), type 2, uncontrolled  -Hemoglobin A1c 8.9% on 02/06/2013.  -Seen by diabetes coordinator.  -Lantus currently on hold. May use sliding scale insulin while in LTAC and if CBG's start to trend up then restart Lantus. Hypokalemia  -Continue to supplement as indicated. Potassium is within normal limits prior to discharge Hyponatremia  -Likely secondary to dehydration. Resolved with hydration.  Abdominal pain  -Patient has some element of chronic pain and is opiate dependent.  Dysphasia  -  Status post esophagram on 02/18/2013 with findings concerning for achalasia.  - NPO but may  Continue tube feeds History of solitary pulmonary nodule  -Reports a  pulmonary nodule was discovered on imaging at [facility]. Not seen on follow up imaging.  Diabetic neuropathy  -Continue gabapentin.   Code Status: Full.  Family Communication: None at bedside.    Medical Consultants:  Dr. Sandi Mariscal, IR  Dr. Anson Fret, Gastroenterology. Other Consultants:  Diabetes coordinator Anti-infectives:  None   Discharge Exam: Filed Vitals:   02/20/13 1504  BP: 125/88  Pulse: 86  Temp: 97.6 F (36.4 C)  Resp: 18   Filed Vitals:   02/19/13 2131 02/20/13 0528 02/20/13 0821 02/20/13 1504  BP: 128/89 115/83  125/88  Pulse: 85 87  86  Temp: 97.6 F (36.4 C) 98 F (36.7 C)  97.6 F (36.4 C)  TempSrc: Axillary Oral  Oral  Resp: 16 14  18   Height:      Weight:      SpO2: 96% 95% 100% 96%    General: Pt is sleeping, no acute distress Cardiovascular: Regular rate and rhythm, S1/S2 +, no murmurs, no rubs, no gallops Respiratory: Clear to auscultation bilaterally, no wheezing, no crackles, no rhonchi Abdominal: Soft, tender in mid abdomen, non distended, bowel sounds +, no guarding; (+) peg in place from outside Extremities: no edema, no cyanosis, pulses palpable bilaterally DP and PT Neuro: Grossly nonfocal  Discharge Instructions  Discharge Orders   Future Orders Complete By Expires     Call MD for:  difficulty breathing, headache or visual disturbances  As directed     Call MD for:  persistant dizziness or light-headedness  As directed     Call MD for:  persistant nausea and vomiting  As directed     Call MD for:  severe uncontrolled pain  As directed     Diet - low sodium heart healthy  As directed     Discharge instructions  As directed     Comments:      Please follow the instructions below: 1. Patient may continue tube feeds as tolerated, patient is using osmolite feeds continuous feeds but may be used as patient tolerates it. 2. Please note that all the medications are either given IV or per tube. Patient continues to be NPO. 3.  patient used to be on Lantus 10 units at bedtime but this is currently on hold due to episodes of hypoglycemia. While in hospital we would administer D50 for hypoglycemia. Patient can however continue sliding scale insulin only at this time. Adjust Lantus as CBGs normalize. 4. patient may continue taking Protonix 40 mg IV twice daily, Reglan 10 mg 4 times daily IV, phenergan 25 mg IV every 6 hours as needed 5. for blood pressure patient can continue taking Norvasc 10 mg daily per tube. Use metoprolol 5 mg IV every 8 hours as needed if systolic blood pressures of 130. Hold metoprolol if heart rate is less than 60. 6. patient can continue taking Dilaudid 2 mg IV every 2 hours as needed for severe pain and oxycodone 10 mg every 6 hours as needed for moderate pain her to 7. throughout the hospital stay patient had very slow recovery. Patient underwent upper endoscopy with findings of coiled G tube which was exchanged by interventional radiology prior to discharge. No evidence of strictures on upper endoscopy. 8. hemoglobin is 9.6 at the time of discharge. Continue to monitor CBC at least every 3 days. 9. renal function  remains within normal limits    Increase activity slowly  As directed         Medication List    STOP taking these medications       esomeprazole 40 MG capsule  Commonly known as:  NEXIUM     insulin glargine 100 UNIT/ML injection  Commonly known as:  LANTUS     insulin lispro 100 UNIT/ML injection  Commonly known as:  HUMALOG     metoCLOPramide 10 MG tablet  Commonly known as:  REGLAN  Replaced by:  metoCLOPramide 5 MG/ML injection     metoprolol succinate 50 MG 24 hr tablet  Commonly known as:  TOPROL XL     promethazine 25 MG tablet  Commonly known as:  PHENERGAN  Replaced by:  promethazine 25 MG/ML injection      TAKE these medications       amLODipine 10 MG tablet  Commonly known as:  NORVASC  Place 1 tablet (10 mg total) into feeding tube daily.      atorvastatin 40 MG tablet  Commonly known as:  LIPITOR  Place 1 tablet (40 mg total) into feeding tube daily at 6 PM.     bisacodyl 10 MG suppository  Commonly known as:  DULCOLAX  Place 1 suppository (10 mg total) rectally daily.     docusate 50 MG/5ML liquid  Commonly known as:  COLACE  Place 10 mLs (100 mg total) into feeding tube 2 (two) times daily.     docusate sodium 100 MG capsule  Commonly known as:  COLACE  Take 100 mg by mouth 2 (two) times daily.     feeding supplement (OSMOLITE 1.2 CAL) Liqd  Place 1,000 mLs into feeding tube continuous. Rate:37ml/hr     Fluticasone-Salmeterol 250-50 MCG/DOSE Aepb  Commonly known as:  ADVAIR  Inhale 1 puff into the lungs 2 (two) times daily.     gabapentin 300 MG capsule  Commonly known as:  NEURONTIN  Place 2 capsules (600 mg total) into feeding tube 3 (three) times daily.     HYDROmorphone 2 MG/ML Soln injection  Commonly known as:  DILAUDID  Inject 1-1.5 mLs (2-3 mg total) into the vein every 2 (two) hours as needed.     insulin aspart 100 UNIT/ML injection  Commonly known as:  novoLOG  Inject 0-9 Units into the skin 3 (three) times daily before meals.     LORazepam 2 MG/ML injection  Commonly known as:  ATIVAN  Inject 0.25 mLs (0.5 mg total) into the vein every 6 (six) hours as needed for anxiety.     metoCLOPramide 5 MG/ML injection  Commonly known as:  REGLAN  Inject 2 mLs (10 mg total) into the vein 4 (four) times daily.     metoprolol 1 MG/ML injection  Commonly known as:  LOPRESSOR  Inject 5 mLs (5 mg total) into the vein every 8 (eight) hours as needed (Please use for systolic blood pressure above 130; hold if HR less than 60).     multivitamin with minerals Tabs  Place 1 tablet into feeding tube daily.     oxyCODONE 5 MG immediate release tablet  Commonly known as:  Oxy IR/ROXICODONE  Place 1-2 tablets (5-10 mg total) into feeding tube every 6 (six) hours as needed.     pantoprazole 40 MG injection   Commonly known as:  PROTONIX  Inject 40 mg into the vein every 12 (twelve) hours.     promethazine 25 MG/ML injection  Commonly known as:  PHENERGAN  Inject 1 mL (25 mg total) into the vein every 6 (six) hours as needed for nausea.     sucralfate 1 GM/10ML suspension  Commonly known as:  CARAFATE  Place 10 mLs (1 g total) into feeding tube 4 (four) times daily -  with meals and at bedtime.     zolpidem 10 MG tablet  Commonly known as:  AMBIEN  Take 1 tablet (10 mg total) by mouth at bedtime as needed for sleep.           Follow-up Information   Follow up with [facility], MD. Call in 1 week.   Contact information:   [address] [place] [id number]        The results of significant diagnostics from this hospitalization (including imaging, microbiology, ancillary and laboratory) are listed below for reference.    Significant Diagnostic Studies: Ct Abdomen Pelvis Wo Contrast  02/06/2013   *RADIOLOGY REPORT*  Clinical Data: Abdominal discomfort, gastroesophageal reflux, diabetic gastroparesis  CT ABDOMEN AND PELVIS WITHOUT CONTRAST  Technique:  Multidetector CT imaging of the abdomen and pelvis was performed following the standard protocol without intravenous contrast.  Comparison: 12/24/2012  Findings: Lung bases clear.  Normal heart size.  Circumferential wall thickening of the distal esophagus with an associated small hiatal hernia, this can be seen with chronic esophagitis/reflux. No pericardial or pleural effusion.  Abdomen:  The balloon retention gastrojejunostomy is coiled the stomach. Prior cholecystectomy noted.  Liver, biliary system, pancreas, spleen, adrenal glands, and kidneys demonstrate no acute process by noncontrast imaging.  Negative for bowel obstruction, dilatation, ileus pattern, or free air.  No hydronephrosis or obstructing urinary tract calculus demonstrated.  No evidence of hemorrhage, adenopathy, fluid collection, or abscess.  Atherosclerosis of the aorta  without aneurysm.  Postop changes of the bowel in the left upper quadrant noted.  Pelvis:  Moderate distention of the urinary bladder.  Prior hysterectomy evident.  No pelvic free fluid, fluid collection, hemorrhage, abscess, inguinal abnormality, or hernia.  Pelvic calcifications noted consistent with venous phleboliths.  Postop changes of the lumbar spine.  IMPRESSION: Jejunal feeding tube coiled in the stomach.  Thickened distal esophageal wall with an associated hiatal hernia, suspect chronic reflux/esophagitis.  Prior cholecystectomy and hysterectomy  No acute intra-abdominal or pelvic process by noncontrast imaging.  No obstructing urinary tract calculus  Moderate to severe bladder distention   Original Report Authenticated By: Jerilynn Mages. Annamaria Boots, M.D.   Dg Abd 1 View  02/19/2013   *RADIOLOGY REPORT*  Clinical Data: Jejunostomy tube.  ABDOMEN - 1 VIEW  Comparison: None.  Findings: A tube is identified in the left upper quadrant of the abdomen.  Injected contrast appears to fill the stomach.  Contrast material is present through the colon from prior to injection 02/12/2013.  Gas filled, prominent loops of small and large bowel are identified.  IMPRESSION:  1.  Tube is identified in the left upper quadrant of the abdomen with contrast within the stomach. 2.  Bowel gas pattern suggestive of ileus.   Original Report Authenticated By: Orlean Patten, M.D.   US Abdomen Complete  02/06/2013   *RADIOLOGY REPORT*  Clinical Data:  Elevated alkaline phosphatase level, hypertension, diabetes  COMPLETE ABDOMINAL ULTRASOUND  Comparison:  CT abdomen pelvis of 02/06/2013  Findings:  Gallbladder:  The gallbladder has previously been resected.  Common bile duct:  The common bile duct is normal measuring 4.6 mm in diameter.  Liver:  The liver is unremarkable with no focal abnormality noted. No ductal dilatation.  IVC:  Appears normal.  Pancreas:  The midportion of the pancreas appears normal with the head and tail obscured by bowel  gas.  Spleen:  Spleen is normal measuring 5.3 cm sagittally.  Right Kidney:  No hydronephrosis is seen.  The right kidney measures 13.3 cm sagittally and does appear to be somewhat echogenic.  Chronic renal medical disease is a consideration.  Left Kidney:  No hydronephrosis is seen.  The left kidney measures 14.2 cm sagittally and echogenicity of the renal parenchyma appears more normal.  Abdominal aorta:  The abdominal aorta is normal in caliber.  IMPRESSION:  1.  Somewhat echogenic right renal parenchyma of questionable significance.  Correlate with renal laboratory values.  No hydronephrosis. 2.  The liver is unremarkable. 3.  Prior cholecystectomy   Original Report Authenticated By: Ivar Drape, M.D.   Dg Esophagus  02/18/2013   *RADIOLOGY REPORT*  Clinical Data:Dysphagia.  History of gastric paresis.  History of diabetes.  The patient has a J tube.  She reports nausea, vomiting, weight loss.  Epigastric pain.  ESOPHAGUS/BARIUM SWALLOW/TABLET STUDY  Fluoroscopy Time: 3 minutes, 54 seconds  Comparison: 02/06/2013 acute abdomen series  Findings: With administration of a single swallow of barium, the patient became nauseated.  The gastroesophageal junction remains closed for greater than 5 minutes, only opening briefly to allow small amount of contrast into the stomach.  The brief view of the gastroesophageal opening appears smooth.  Findings favor achalasia over stricture.  Because the patient experienced significant pain and nausea but no vomiting, an orogastric tube was placed and the esophagus was decompressed. The tube was then removed and the patient had symptomatic relief.  There was no evidence for aspiration during exam.  IMPRESSION:  1.  Significant obstruction of the lower esophagus. 2.  The findings favor achalasia over lower esophageal stricture.  The findings were discussed with Dr. Rockne Menghini on 02/18/2013 at 12:42 p.m.   Original Report Authenticated By: Nolon Nations, M.D.   Ir Fluoro Guide Cv  Line Right  02/14/2013   *RADIOLOGY REPORT*  Indication: Poor venous access  ULTRASOUND AND FLUORSCOPIC GUIDED PICC LINE INSERTION  Intravenous Medications: None  Contrast: None  Fluoroscopy Time:  6-seconds  Complications: None immediate  Technique / Findings:  The procedure, risks, benefits, and alternatives were explained to the patient and informed written consent was obtained.  A timeout was performed prior to the initiation of the procedure.  The right upper extremity was prepped with chlorhexidine in a sterile fashion, and a sterile drape was applied covering the operative field.  Maximum barrier sterile technique with sterile gowns and gloves were used for the procedure.  A timeout was performed prior to the initiation of the procedure.  Local anesthesia was provided with 1% lidocaine.  Under direct ultrasound guidance, the rightbrachialvein was accessed with a micropuncture kit after the overlying soft tissues were anesthetized with 1% lidocaine.  An ultrasound image was saved for documentation purposes.  A guidewire was advanced to the level of the superior caval-atrial junction for measurement purposes and the PICC line was cut to length.  A peel-away sheath was placed and a 36 cm, 5 [place], dual lumen was inserted to level of the superior caval-atrial junction.  A post procedure spot fluoroscopic was obtained.  The catheter easily aspirated and flushed and was sutured in place.  A dressing was placed.  The patient tolerated the procedure well without immediate post procedural complication.  Impression:  Successful ultrasound and fluoroscopic guided placement of a right  brachial vein approach, 36 cm, 5 French,dual lumen PICC with tip at the superior caval-atrial junction.  The PICC line is ready for immediate use.   Original Report Authenticated By: Jake Seats, MD   Ir Fluoro Guide Cv Line Right  02/08/2013   *RADIOLOGY REPORT*  Clinical Data: Nausea, vomiting, abdominal pain, diabetic gastroparesis  and poor IV access.  PICC LINE PLACEMENT WITH ULTRASOUND AND FLUOROSCOPIC  GUIDANCE  Fluoroscopy Time: 18 seconds  The right arm was prepped with chlorhexidine, draped in the usual sterile fashion using maximum barrier technique (cap and mask, sterile gown, sterile gloves, large sterile sheet, hand hygiene and cutaneous antisepsis) and infiltrated locally with 1% Lidocaine.  Ultrasound demonstrated patency of the right brachial vein, and this was documented with an image.  Under real-time ultrasound guidance, this vein was accessed with a 21 gauge micropuncture needle and image documentation was performed.  The needle was exchanged over a guidewire for a peel-away sheath through which a 5 [place] dual lumen PICC trimmed to 39 cm was advanced, positioned with its tip at the lower SVC/right atrial junction.  Fluoroscopy during the procedure and fluoro spot radiograph confirms appropriate catheter position.  The catheter was flushed, secured to the skin with Prolene sutures, and covered with a sterile dressing.  Complications:  None  IMPRESSION: Successful right arm PICC line placement with ultrasound and fluoroscopic guidance.  The catheter is ready for use.   Original Report Authenticated By: Aletta Edouard, M.D.   Ir Replc Duoden/jejuno Tube Percut W/fluoro  02/07/2013   *RADIOLOGY REPORT*  Clinical Data: Malpositioned jejunal feeding tube coiled in the stomach.  FLUOROSCOPIC REPLACEMENT OF THE 18-FRENCH JEJUNAL FEEDING TUBE  Date:  02/07/2013 10:45:00  Radiologist:  M. Daryll Brod, M.D.  Medications:  None.  Guidance:  Fluoroscopic  Fluoroscopy time:  6 minutes  Sedation time:  None.  Contrast volume:  10 ml [id number]  Complications:  No immediate  PROCEDURE/FINDINGS:  Informed consent was obtained from the patient following explanation of the procedure, risks, benefits and alternatives. The patient understands, agrees and consents for the procedure. All questions were addressed.  A time out was performed.   Maximal barrier sterile technique utilized including caps, mask, sterile gowns, sterile gloves, large sterile drape, hand hygiene, and betadine  Under sterile conditions, the existing malpositioned feeding tube was removed.  A C2 catheter and a glidewire were utilized to manipulate the access into the proximal jejunum.  Over the guide wire, a new 18-French balloon retention jejunostomy was advanced. Tip position confirmed the proximal jejunum with a contrast injection.  Images obtained for documentation.  Retention balloon inflated with 10 ml saline and retracted against anterior gastric wall.  No immediate complication.  The patient tolerated the procedure well.  IMPRESSION: Successful fluoroscopic replacement of the 18-French jejunostomy. Tip in the proximal jejunum ready for use   Original Report Authenticated By: Jerilynn Mages. Annamaria Boots, M.D.   Ir US Guide Vasc Access Left  02/14/2013   *RADIOLOGY REPORT*  Indication: Poor venous access  ULTRASOUND AND FLUORSCOPIC GUIDED PICC LINE INSERTION  Intravenous Medications: None  Contrast: None  Fluoroscopy Time:  6-seconds  Complications: None immediate  Technique / Findings:  The procedure, risks, benefits, and alternatives were explained to the patient and informed written consent was obtained.  A timeout was performed prior to the initiation of the procedure.  The right upper extremity was prepped with chlorhexidine in a sterile fashion, and a sterile drape was applied covering the operative field.  Maximum barrier sterile  technique with sterile gowns and gloves were used for the procedure.  A timeout was performed prior to the initiation of the procedure.  Local anesthesia was provided with 1% lidocaine.  Under direct ultrasound guidance, the rightbrachialvein was accessed with a micropuncture kit after the overlying soft tissues were anesthetized with 1% lidocaine.  An ultrasound image was saved for documentation purposes.  A guidewire was advanced to the level of the  superior caval-atrial junction for measurement purposes and the PICC line was cut to length.  A peel-away sheath was placed and a 36 cm, 5 [place], dual lumen was inserted to level of the superior caval-atrial junction.  A post procedure spot fluoroscopic was obtained.  The catheter easily aspirated and flushed and was sutured in place.  A dressing was placed.  The patient tolerated the procedure well without immediate post procedural complication.  Impression:  Successful ultrasound and fluoroscopic guided placement of a right brachial vein approach, 36 cm, 5 French,dual lumen PICC with tip at the superior caval-atrial junction.  The PICC line is ready for immediate use.   Original Report Authenticated By: Jake Seats, MD   Ir US Guide Vasc Access Right  02/08/2013   *RADIOLOGY REPORT*  Clinical Data: Nausea, vomiting, abdominal pain, diabetic gastroparesis and poor IV access.  PICC LINE PLACEMENT WITH ULTRASOUND AND FLUOROSCOPIC  GUIDANCE  Fluoroscopy Time: 18 seconds  The right arm was prepped with chlorhexidine, draped in the usual sterile fashion using maximum barrier technique (cap and mask, sterile gown, sterile gloves, large sterile sheet, hand hygiene and cutaneous antisepsis) and infiltrated locally with 1% Lidocaine.  Ultrasound demonstrated patency of the right brachial vein, and this was documented with an image.  Under real-time ultrasound guidance, this vein was accessed with a 21 gauge micropuncture needle and image documentation was performed.  The needle was exchanged over a guidewire for a peel-away sheath through which a 5 [place] dual lumen PICC trimmed to 39 cm was advanced, positioned with its tip at the lower SVC/right atrial junction.  Fluoroscopy during the procedure and fluoro spot radiograph confirms appropriate catheter position.  The catheter was flushed, secured to the skin with Prolene sutures, and covered with a sterile dressing.  Complications:  None  IMPRESSION: Successful right  arm PICC line placement with ultrasound and fluoroscopic guidance.  The catheter is ready for use.   Original Report Authenticated By: Aletta Edouard, M.D.   Dg Abd Acute W/chest  02/12/2013   *RADIOLOGY REPORT*  Clinical Data: Nausea and vomiting with distention.  ACUTE ABDOMEN SERIES (ABDOMEN 2 VIEW & CHEST 1 VIEW)  Comparison: None.  Findings: Normal heart size with clear lung fields.  No bony abnormality.  No free air.  Jejunal tube has been injected with dilute contrast and fills the proximal jejunum.  There is no bowel obstruction.  Surgical clips right upper quadrant. Contrast material from a separate procedure opacifies the colon.  Osseous structures unremarkable.  Prior lumbar fusion.  IMPRESSION: No evidence for bowel obstruction.  No active cardiopulmonary disease.  Jejunal tube appears positioned within the proximal small bowel.   Original Report Authenticated By: Rolla Flatten, M.D.   Dg Abd Acute W/chest  02/06/2013   *RADIOLOGY REPORT*  Clinical Data: Mid abdominal pain and vomiting for 24 hours.  ACUTE ABDOMEN SERIES (ABDOMEN 2 VIEW & CHEST 1 VIEW)  Comparison: Chest 01/19/2013.  Abdominal series 01/19/2013.  Findings: Slightly shallow inspiration. The heart size and pulmonary vascularity are normal. The lungs appear clear and expanded without  focal air space disease or consolidation. No blunting of the costophrenic angles.  Central perihilar interstitial changes suggest chronic bronchitis.  No pneumothorax. Mediastinal contours appear intact.  Scattered gas and stool in the colon.  No small or large bowel distension.  Postoperative changes in the left abdomen consistent with bowel staples. Surgical clips in the right upper quadrant.  No free air in the abdomen.  No abnormal air fluid levels.  Calcified phleboliths in the pelvis.  No radiopaque stones.  Postoperative changes in the lumbar spine.  IMPRESSION: No evidence of active pulmonary disease.  Probable chronic bronchitic changes.   Nonobstructive bowel gas pattern.   Original Report Authenticated By: Lucienne Capers, M.D.    Microbiology: Recent Results (from the past 240 hour(s))  URINE CULTURE     Status: None   Collection Time    02/12/13 11:03 AM      Result Value Range Status   Specimen Description URINE, CLEAN CATCH   Final   Special Requests NONE   Final   Culture  Setup Time 02/12/2013 14:52   Final   Colony Count 30,000 COLONIES/ML   Final   Culture     Final   Value: Multiple bacterial morphotypes present, none predominant. Suggest appropriate recollection if clinically indicated.   Report Status 02/13/2013 FINAL   Final  CULTURE, BLOOD (ROUTINE X 2)     Status: None   Collection Time    02/12/13  2:20 PM      Result Value Range Status   Specimen Description BLOOD LEFT HAND   Final   Special Requests BOTTLES DRAWN AEROBIC ONLY 2CC   Final   Culture  Setup Time 02/12/2013 21:11   Final   Culture NO GROWTH 5 DAYS   Final   Report Status 02/18/2013 FINAL   Final  CULTURE, BLOOD (ROUTINE X 2)     Status: None   Collection Time    02/13/13  2:00 AM      Result Value Range Status   Specimen Description BLOOD LEFT HAND   Final   Special Requests BOTTLES DRAWN AEROBIC ONLY  2ML   Final   Culture  Setup Time 02/13/2013 09:26   Final   Culture NO GROWTH 5 DAYS   Final   Report Status 02/19/2013 FINAL   Final     Labs: Basic Metabolic Panel:  Recent Labs Lab 02/14/13 0645 02/15/13 0400 02/16/13 0600 02/17/13 0606 02/20/13 0600  NA 132* 133* 133* 136 134*  K 3.4* 3.7 3.4* 3.4* 4.7  CL 98 100 99 98 101  CO2 25 26 26 27 25   GLUCOSE 72 133* 139* 85 85  BUN 12 7 4* 3* 4*  CREATININE 0.73 0.61 0.59 0.52 0.71  CALCIUM 8.7 8.8 8.6 9.3 9.2   Liver Function Tests: No results found for this basename: AST, ALT, ALKPHOS, BILITOT, PROT, ALBUMIN,  in the last 168 hours No results found for this basename: LIPASE, AMYLASE,  in the last 168 hours No results found for this basename: AMMONIA,  in the last  168 hours CBC:  Recent Labs Lab 02/14/13 0645 02/15/13 0400 02/16/13 0600 02/17/13 0606 02/20/13 0600  WBC 7.7 6.4 6.9 7.6 7.2  HGB 11.1* 10.7* 9.5* 10.7* 9.6*  HCT 33.0* 32.0* 28.4* 31.8* 29.3*  MCV 88.7 88.6 88.8 87.4 88.8  PLT 161 184 179 221 212   Cardiac Enzymes: No results found for this basename: CKTOTAL, CKMB, CKMBINDEX, TROPONINI,  in the last 168 hours BNP: BNP (last 3 results)  No results found for this basename: PROBNP,  in the last 8760 hours CBG:  Recent Labs Lab 02/20/13 0008 02/20/13 0402 02/20/13 0647 02/20/13 0746 02/20/13 0812  GLUCAP 77 64* 74 67* 138*    Time coordinating discharge: Over 30 minutes  Signed:  Leisa Lenz, MD  [facility]  02/20/2013, 3:25 PM  Pager #: [phone]

## 2013-02-20 NOTE — Progress Notes (Signed)
Patient [id number] F Cieslik      DOB: 10-20-1970      [id number]  Consult Received Earlier today.  Attempted to see patient who was sent down to IR this afternoon to exchange PEG tube.  Patient currently waiting transport to [facility].  I communicated with Nurse at [facility] who will be receiving patient to update on not completing consult.  I will defer consult to [facility] staff to facilitate continuity of care.   Faven Watterson L. Lovena Le, MD MBA The Palliative Medicine Team at [facility] Phone: [phone] Pager: [phone]

## 2013-02-20 NOTE — Progress Notes (Addendum)
Palliative Medicine Team consult for symptom management recommendations received spoke with Dr Charlies Silvers who indicates patient may transfer to [facility] after PEJ tube repositioned though it is unclear whether this will happen tonight or tomorrow, patient seen at bedside, ambulating in room, stated her abdominal pain is 8/10- she states that 'while the pain medication helps it does not last for long' currently has IV Dilaudid and IV ativan orders in place - while writer was in room patient's sister Francee Nodal [phone]) called and patient asked writer to speak with sister- Ms Alveta Heimlich had questions about plans for transfer- this Probation officer informed she would ask the [facility] to call her/patient's sister back to discuss and patient and sister were agreeable to this Venita Lick [facility] contacted and aware of above PMT Dr Lovena Le to see patient later today

## 2013-02-20 NOTE — Care Management Note (Addendum)
[facility] geologist verified pt eligibility for LT [facility] when medically stable. MD made aware. Cm spoke with patient at bedside concerning possible dc to the long term acute care facility. Pt states unable to manage pain and tolerate tube feeds at home. Md order for Palliative Care Consult for symptom management. Pt agreeable to plan of care. Will continue to monitor.    Venita Lick Daviana Haymaker,RN,BSN [phone]

## 2013-03-31 ENCOUNTER — Encounter (HOSPITAL_COMMUNITY): Payer: Self-pay

## 2013-03-31 ENCOUNTER — Emergency Department (HOSPITAL_COMMUNITY)
Admission: EM | Admit: 2013-03-31 | Discharge: 2013-03-31 | Disposition: A | Discharge status: Home / Self Care | Payer: Medicare Other | Attending: Emergency Medicine | Admitting: Emergency Medicine

## 2013-03-31 ENCOUNTER — Emergency Department (HOSPITAL_COMMUNITY): Payer: Medicare Other

## 2013-03-31 DIAGNOSIS — I10 Essential (primary) hypertension: Secondary | ICD-10-CM | POA: Insufficient documentation

## 2013-03-31 DIAGNOSIS — Z880 Allergy status to penicillin: Secondary | ICD-10-CM | POA: Insufficient documentation

## 2013-03-31 DIAGNOSIS — Z794 Long term (current) use of insulin: Secondary | ICD-10-CM | POA: Insufficient documentation

## 2013-03-31 DIAGNOSIS — Z87891 Personal history of nicotine dependence: Secondary | ICD-10-CM | POA: Insufficient documentation

## 2013-03-31 DIAGNOSIS — Z90710 Acquired absence of both cervix and uterus: Secondary | ICD-10-CM | POA: Insufficient documentation

## 2013-03-31 DIAGNOSIS — Z9889 Other specified postprocedural states: Secondary | ICD-10-CM | POA: Insufficient documentation

## 2013-03-31 DIAGNOSIS — I252 Old myocardial infarction: Secondary | ICD-10-CM | POA: Insufficient documentation

## 2013-03-31 DIAGNOSIS — J441 Chronic obstructive pulmonary disease with (acute) exacerbation: Secondary | ICD-10-CM | POA: Insufficient documentation

## 2013-03-31 DIAGNOSIS — K861 Other chronic pancreatitis: Secondary | ICD-10-CM | POA: Insufficient documentation

## 2013-03-31 DIAGNOSIS — E119 Type 2 diabetes mellitus without complications: Secondary | ICD-10-CM | POA: Insufficient documentation

## 2013-03-31 DIAGNOSIS — G8929 Other chronic pain: Secondary | ICD-10-CM | POA: Insufficient documentation

## 2013-03-31 DIAGNOSIS — R109 Unspecified abdominal pain: Secondary | ICD-10-CM | POA: Insufficient documentation

## 2013-03-31 DIAGNOSIS — Z79899 Other long term (current) drug therapy: Secondary | ICD-10-CM | POA: Insufficient documentation

## 2013-03-31 DIAGNOSIS — E785 Hyperlipidemia, unspecified: Secondary | ICD-10-CM | POA: Insufficient documentation

## 2013-03-31 DIAGNOSIS — R1110 Vomiting, unspecified: Secondary | ICD-10-CM | POA: Insufficient documentation

## 2013-03-31 DIAGNOSIS — K219 Gastro-esophageal reflux disease without esophagitis: Secondary | ICD-10-CM | POA: Insufficient documentation

## 2013-03-31 DIAGNOSIS — I2510 Atherosclerotic heart disease of native coronary artery without angina pectoris: Secondary | ICD-10-CM | POA: Insufficient documentation

## 2013-03-31 LAB — COMPREHENSIVE METABOLIC PANEL
ALT: 34 U/L (ref 0–35)
CO2: 28 mEq/L (ref 19–32)
Calcium: 10 mg/dL (ref 8.4–10.5)
Chloride: 98 mEq/L (ref 96–112)
Creatinine, Ser: 0.46 mg/dL — ABNORMAL LOW (ref 0.50–1.10)
GFR calc Af Amer: >90 mL/min (ref >90)
GFR calc non Af Amer: >90 mL/min (ref >90)
Glucose, Bld: 203 mg/dL — ABNORMAL HIGH (ref 70–99)
Total Bilirubin: 0.2 mg/dL — ABNORMAL LOW (ref 0.3–1.2)

## 2013-03-31 LAB — CBC WITH DIFFERENTIAL/PLATELET
Eosinophils Absolute: 0.1 10*3/uL (ref 0.0–0.7)
Eosinophils Relative: 1 % (ref 0–5)
HCT: 36.8 % (ref 36.0–46.0)
Hemoglobin: 12.4 g/dL (ref 12.0–15.0)
Lymphocytes Relative: 24 % (ref 12–46)
Lymphs Abs: 2.3 10*3/uL (ref 0.7–4.0)
MCH: 29.2 pg (ref 26.0–34.0)
MCV: 86.8 fL (ref 78.0–100.0)
Monocytes Relative: 5 % (ref 3–12)
RBC: 4.24 MIL/uL (ref 3.87–5.11)

## 2013-03-31 MED ORDER — SODIUM CHLORIDE 0.9 % IV SOLN
INTRAVENOUS | Status: DC
  Administered 2013-03-31: 21:00:00 via INTRAVENOUS

## 2013-03-31 MED ORDER — SODIUM CHLORIDE 0.9 % IV BOLUS (SEPSIS)
500.0000 mL | Freq: Once | INTRAVENOUS | Status: AC
  Administered 2013-03-31: 500 mL via INTRAVENOUS

## 2013-03-31 NOTE — ED Notes (Signed)
Per EMS patient began having stomach pain last night (hx of gastroparesis). Patient took medication for her pains but vomited. Also has feeding tube. Patient states she is vomiting coffee grounds. 138/102, 90,res 30.

## 2013-03-31 NOTE — ED Notes (Signed)
Bed: WA12 Expected date:  Expected time:  Means of arrival:  Comments: EMS abdominal pain 

## 2013-03-31 NOTE — ED Provider Notes (Signed)
CSN: [id number]     Arrival date & time 03/31/13  1906 History   First MD Initiated Contact with Patient 03/31/13 1946     Chief Complaint  Patient presents with  . Abdominal Pain   (Consider location/radiation/quality/duration/timing/severity/associated sxs/prior Treatment) HPI Comments: [name] is a 42 y.o. Female who presents for evaluation of vomiting and abdominal pain. This has been persistent since she was discharged from the hospital 2 days ago. The problem is recurrent. She states that she cannot tolerate her medications due to the vomiting, even though they're placed through her percutaneous gastrostomy. She denies fever or chills, cough, shortness of breath, or chest pain. There are no other known modifying factors.  Patient is a 42 y.o. female presenting with abdominal pain. The history is provided by the patient.  Abdominal Pain   Past Medical History  Diagnosis Date  . Hypertension   . Diabetes mellitus   . Gastroparesis   . Asthma   . GERD (gastroesophageal reflux disease)   . Coronary artery disease   . h/o seizure   . Neuropathy   . Chronic pancreatitis   . Dyslipidemia   . MI (myocardial infarction)   . COPD (chronic obstructive pulmonary disease)   . Seizures    Past Surgical History  Procedure Laterality Date  . Abdominal hysterectomy    . Cholecystectomy    . Peg tube x 4      feeding jejunostomies with PEG tubes  . Cesarean section    . Colon resection due to diverticulitis    . Back surgery    . Esophagogastroduodenoscopy N/A 12/27/2012    Procedure: ESOPHAGOGASTRODUODENOSCOPY (EGD);  Surgeon: Missy Sabins, MD;  Location: [facility];  Service: Endoscopy;  Laterality: N/A;  . Esophagogastroduodenoscopy (egd) with esophageal dilation N/A 02/19/2013    Procedure: ESOPHAGOGASTRODUODENOSCOPY (EGD) WITH ESOPHAGEAL DILATION;  Surgeon: Wonda Horner, MD;  Location: [facility];  Service: Endoscopy;  Laterality: N/A;  . Balloon dilation N/A 02/19/2013    Procedure: BALLOON DILATION;  Surgeon: Wonda Horner, MD;  Location: [facility];  Service: Endoscopy;  Laterality: N/A;   Family History  Problem Relation Age of Onset  . Cancer Mother    History  Substance Use Topics  . Smoking status: Former Smoker -- 0.25 packs/day for 2 years    Types: Cigarettes  . Smokeless tobacco: Never Used  . Alcohol Use: No     Comment: hx 1 beer daily -pt states she quit Dec. 2012   OB History   Grav Para Term Preterm Abortions TAB SAB Ect Mult Living                 Review of Systems  Gastrointestinal: Positive for abdominal pain.  All other systems reviewed and are negative.    Allergies  Penicillins; Cefadroxil; Compazine; Darvocet; Nsaids; and Zofran  Home Medications   Current Outpatient Rx  Name  Route  Sig  Dispense  Refill  . amLODipine (NORVASC) 10 MG tablet   Per Tube   Place 1 tablet (10 mg total) into feeding tube daily.   30 tablet   2   . atorvastatin (LIPITOR) 40 MG tablet   Per Tube   Place 1 tablet (40 mg total) into feeding tube daily at 6 PM.   30 tablet   0   . bisacodyl (DULCOLAX) 10 MG suppository   Rectal   Place 1 suppository (10 mg total) rectally daily.   12 suppository   0   .  busPIRone (BUSPAR) 5 MG tablet   Per Tube   Place 5 mg into feeding tube 3 (three) times daily.         Marland Kitchen docusate (COLACE) 50 MG/5ML liquid   Per Tube   Place 10 mLs (100 mg total) into feeding tube 2 (two) times daily.   100 mL   0   . Fluticasone-Salmeterol (ADVAIR) 250-50 MCG/DOSE AEPB   Inhalation   Inhale 1 puff into the lungs 2 (two) times daily.   60 each   0   . gabapentin (NEURONTIN) 300 MG capsule   Per Tube   Place 2 capsules (600 mg total) into feeding tube 3 (three) times daily.   30 capsule   0   . HYDROmorphone (DILAUDID) 2 MG/ML SOLN injection   Intravenous   Inject 1-1.5 mLs (2-3 mg total) into the vein every 2 (two) hours as needed.   1 mL   0   . insulin aspart (NOVOLOG) 100 UNIT/ML  injection   Subcutaneous   Inject 0-9 Units into the skin 3 (three) times daily before meals.   1 vial   12   . LORazepam (ATIVAN) 2 MG/ML injection   Intravenous   Inject 0.25 mLs (0.5 mg total) into the vein every 6 (six) hours as needed for anxiety.   1 mL   0   . metoCLOPramide (REGLAN) 5 MG/ML injection   Intravenous   Inject 2 mLs (10 mg total) into the vein 4 (four) times daily.   2 mL   0   . metoprolol (LOPRESSOR) 1 MG/ML injection   Intravenous   Inject 5 mLs (5 mg total) into the vein every 8 (eight) hours as needed (Please use for systolic blood pressure above 130; hold if HR less than 60).   5 mL   0   . Multiple Vitamin (MULTIVITAMIN WITH MINERALS) TABS   Per Tube   Place 1 tablet into feeding tube daily.   30 tablet   0   . Nutritional Supplements (FEEDING SUPPLEMENT, OSMOLITE 1.2 CAL,) LIQD   Per Tube   Place 1,000 mLs into feeding tube continuous. Rate:67ml/hr         . oxyCODONE (OXY IR/ROXICODONE) 5 MG immediate release tablet   Per Tube   Place 1-2 tablets (5-10 mg total) into feeding tube every 6 (six) hours as needed.   15 tablet   0   . pantoprazole (PROTONIX) 40 MG injection   Intravenous   Inject 40 mg into the vein every 12 (twelve) hours.   1 each   0   . promethazine (PHENERGAN) 25 MG/ML injection   Intravenous   Inject 1 mL (25 mg total) into the vein every 6 (six) hours as needed for nausea.   1 mL   0   . sucralfate (CARAFATE) 1 GM/10ML suspension   Per Tube   Place 10 mLs (1 g total) into feeding tube 4 (four) times daily -  with meals and at bedtime.   420 mL   0   . torsemide (DEMADEX) 10 MG tablet   Oral   Take 10 mg by mouth daily as needed (swelling).         . zolpidem (AMBIEN) 10 MG tablet   Oral   Take 1 tablet (10 mg total) by mouth at bedtime as needed for sleep.   30 tablet   0    BP 160/95  Pulse 93  Temp(Src) 98.7 F (37.1 C) (Oral)  Resp  28  SpO2 96% Physical Exam  Nursing note and vitals  reviewed. Constitutional: She is oriented to person, place, and time. She appears well-developed and well-nourished.  HENT:  Head: Normocephalic and atraumatic.  Eyes: Conjunctivae and EOM are normal. Pupils are equal, round, and reactive to light.  Neck: Normal range of motion and phonation normal. Neck supple.  Cardiovascular: Normal rate, regular rhythm and intact distal pulses.   Pulmonary/Chest: Effort normal and breath sounds normal. She exhibits no tenderness.  Abdominal: Soft. She exhibits no distension and no mass. There is tenderness (moderate diffuse tenderness. PEG site, left upper quadrant, appears normal). There is no rebound and no guarding.  The patient sits up easily, without exacerbation of her abdominal discomfort  Musculoskeletal: Normal range of motion.  Neurological: She is alert and oriented to person, place, and time. She has normal strength. She exhibits normal muscle tone.  Skin: Skin is warm and dry.  Psychiatric: She has a normal mood and affect. Her behavior is normal. Judgment and thought content normal.    ED Course  Procedures (including critical care time) Medications  0.9 %  sodium chloride infusion ( Intravenous New Bag/Given 03/31/13 2118)  sodium chloride 0.9 % bolus 500 mL (0 mLs Intravenous Stopped 03/31/13 2118)   9:26 PM Reevaluation with update and discussion. After initial assessment and treatment, an updated evaluation reveals [facility], she was resting comfortably. When I spoke she opened her eyes, and immediately began being tremulous and whining, as if in pain. Kaislyn Gulas L    Patient Vitals for the past 24 hrs:  BP Temp Temp src Pulse Resp SpO2  03/31/13 1909 160/95 mmHg 98.7 F (37.1 C) Oral 93 28 96 %     Labs Review Labs Reviewed  COMPREHENSIVE METABOLIC PANEL - Abnormal; Notable for the following:    Potassium 3.4 (*)    Glucose, Bld 203 (*)    Creatinine, Ser 0.46 (*)    Albumin 3.4 (*)    Alkaline Phosphatase 168 (*)     Total Bilirubin 0.2 (*)    All other components within normal limits  URINE CULTURE  CBC WITH DIFFERENTIAL  LIPASE, BLOOD  URINALYSIS, ROUTINE W REFLEX MICROSCOPIC   Imaging Review Dg Abd Acute W/chest  03/31/2013   *RADIOLOGY REPORT*  Clinical Data: Vomiting.  Abdominal pain throughout the abdomen.  ACUTE ABDOMEN SERIES (ABDOMEN 2 VIEW & CHEST 1 VIEW)  Comparison: 02/19/2013.  Findings: Low volume lungs.  No infiltrate or edema.  No effusion or pneumothorax.  Unchanged density at the first costochondral junctions bilaterally.  No cardiomegaly.  Coronary arterial stenting.  Malpositioned gastrojejunostomy tube, which is looped in the lower esophagus with tip in the upper stomach body, near the retention balloon.  Nonobstructive bowel gas pattern. There is contrast within the colon, presumably from an outside study since tube placement 02/20/2013. No pneumoperitoneum.  No abnormal intra-abdominal mass effect.  Cholecystectomy clips.  L4-5 PLIF.  IMPRESSION:  1.  Malpositioned gastrojejunostomy tube, which loops in the lower esophagus before terminating in the upper stomach. 2.  Nonobstructive bowel gas pattern.   Original Report Authenticated By: Jorje Guild    MDM   1. Abdominal pain   2. Vomiting      Vomiting with abdominal pain, recurrent, without evidence for obstruction. Gastrostomy, jejunostomy tube was replaced 3 days ago. There is no indication for obstruction, significant dehydration, acute infection or pneumonia. She does have chronic abdominal pain with chronic vomiting. She has adequate medications at home to treat these  problems. There's no indication for hospital admission or further treatment, in the emergency department setting.  Nursing Notes Reviewed/ Care Coordinated, and agree without changes. Applicable Imaging Reviewed.  Interpretation of Laboratory Data incorporated into ED treatment   Plan: Home Medications- usual; Home Treatments and Observation- watch for  progressive symptoms; return here if the recommended treatment, does not improve the symptoms; Recommended follow up- follow up with GI physician in [place], [place] as needed      Richarda Blade, MD 03/31/13 2130

## 2013-04-06 ENCOUNTER — Encounter (HOSPITAL_COMMUNITY): Payer: Self-pay

## 2013-04-06 ENCOUNTER — Emergency Department (HOSPITAL_COMMUNITY)
Admission: EM | Admit: 2013-04-06 | Discharge: 2013-04-06 | Disposition: A | Discharge status: Home / Self Care | Payer: Medicare Other | Attending: Emergency Medicine | Admitting: Emergency Medicine

## 2013-04-06 DIAGNOSIS — Z8719 Personal history of other diseases of the digestive system: Secondary | ICD-10-CM | POA: Insufficient documentation

## 2013-04-06 DIAGNOSIS — E785 Hyperlipidemia, unspecified: Secondary | ICD-10-CM | POA: Insufficient documentation

## 2013-04-06 DIAGNOSIS — J449 Chronic obstructive pulmonary disease, unspecified: Secondary | ICD-10-CM | POA: Insufficient documentation

## 2013-04-06 DIAGNOSIS — K3184 Gastroparesis: Secondary | ICD-10-CM | POA: Insufficient documentation

## 2013-04-06 DIAGNOSIS — I2510 Atherosclerotic heart disease of native coronary artery without angina pectoris: Secondary | ICD-10-CM | POA: Insufficient documentation

## 2013-04-06 DIAGNOSIS — Z9889 Other specified postprocedural states: Secondary | ICD-10-CM | POA: Insufficient documentation

## 2013-04-06 DIAGNOSIS — I252 Old myocardial infarction: Secondary | ICD-10-CM | POA: Insufficient documentation

## 2013-04-06 DIAGNOSIS — Z90710 Acquired absence of both cervix and uterus: Secondary | ICD-10-CM | POA: Insufficient documentation

## 2013-04-06 DIAGNOSIS — IMO0001 Reserved for inherently not codable concepts without codable children: Secondary | ICD-10-CM | POA: Insufficient documentation

## 2013-04-06 DIAGNOSIS — Z794 Long term (current) use of insulin: Secondary | ICD-10-CM | POA: Insufficient documentation

## 2013-04-06 DIAGNOSIS — E1165 Type 2 diabetes mellitus with hyperglycemia: Secondary | ICD-10-CM

## 2013-04-06 DIAGNOSIS — J4489 Other specified chronic obstructive pulmonary disease: Secondary | ICD-10-CM | POA: Insufficient documentation

## 2013-04-06 DIAGNOSIS — R109 Unspecified abdominal pain: Secondary | ICD-10-CM

## 2013-04-06 DIAGNOSIS — R112 Nausea with vomiting, unspecified: Secondary | ICD-10-CM | POA: Insufficient documentation

## 2013-04-06 DIAGNOSIS — Z9089 Acquired absence of other organs: Secondary | ICD-10-CM | POA: Insufficient documentation

## 2013-04-06 DIAGNOSIS — G589 Mononeuropathy, unspecified: Secondary | ICD-10-CM | POA: Insufficient documentation

## 2013-04-06 DIAGNOSIS — IMO0002 Reserved for concepts with insufficient information to code with codable children: Secondary | ICD-10-CM | POA: Insufficient documentation

## 2013-04-06 DIAGNOSIS — Z79899 Other long term (current) drug therapy: Secondary | ICD-10-CM | POA: Insufficient documentation

## 2013-04-06 DIAGNOSIS — R1084 Generalized abdominal pain: Secondary | ICD-10-CM | POA: Insufficient documentation

## 2013-04-06 DIAGNOSIS — I10 Essential (primary) hypertension: Secondary | ICD-10-CM | POA: Insufficient documentation

## 2013-04-06 DIAGNOSIS — Z880 Allergy status to penicillin: Secondary | ICD-10-CM | POA: Insufficient documentation

## 2013-04-06 DIAGNOSIS — G40909 Epilepsy, unspecified, not intractable, without status epilepticus: Secondary | ICD-10-CM | POA: Insufficient documentation

## 2013-04-06 DIAGNOSIS — Z87891 Personal history of nicotine dependence: Secondary | ICD-10-CM | POA: Insufficient documentation

## 2013-04-06 LAB — CBC WITH DIFFERENTIAL/PLATELET
Basophils Relative: 0 % (ref 0–1)
Eosinophils Absolute: 0 10*3/uL (ref 0.0–0.7)
Hemoglobin: 14.6 g/dL (ref 12.0–15.0)
MCH: 29.9 pg (ref 26.0–34.0)
MCHC: 35.6 g/dL (ref 30.0–36.0)
Monocytes Relative: 4 % (ref 3–12)
Neutro Abs: 9.5 10*3/uL — ABNORMAL HIGH (ref 1.7–7.7)
Neutrophils Relative %: 82 % — ABNORMAL HIGH (ref 43–77)
Platelets: 189 10*3/uL (ref 150–400)
RBC: 4.89 MIL/uL (ref 3.87–5.11)

## 2013-04-06 LAB — COMPREHENSIVE METABOLIC PANEL
ALT: 27 U/L (ref 0–35)
AST: 15 U/L (ref 0–37)
Albumin: 3 g/dL — ABNORMAL LOW (ref 3.5–5.2)
Alkaline Phosphatase: 155 U/L — ABNORMAL HIGH (ref 39–117)
BUN: 16 mg/dL (ref 6–23)
Chloride: 80 mEq/L — ABNORMAL LOW (ref 96–112)
Potassium: 3.1 mEq/L — ABNORMAL LOW (ref 3.5–5.1)
Sodium: 123 mEq/L — ABNORMAL LOW (ref 135–145)
Total Bilirubin: 0.3 mg/dL (ref 0.3–1.2)
Total Protein: 7.9 g/dL (ref 6.0–8.3)

## 2013-04-06 LAB — LIPASE, BLOOD: Lipase: 32 U/L (ref 11–59)

## 2013-04-06 MED ORDER — HYDROMORPHONE HCL PF 2 MG/ML IJ SOLN
2.0000 mg | Freq: Once | INTRAMUSCULAR | Status: AC
  Administered 2013-04-06: 2 mg via INTRAMUSCULAR
  Filled 2013-04-06: qty 1

## 2013-04-06 MED ORDER — SODIUM CHLORIDE 0.9 % IV BOLUS (SEPSIS): 500.0000 mL | Freq: Once | INTRAVENOUS | Status: DC

## 2013-04-06 MED ORDER — INSULIN ASPART 100 UNIT/ML ~~LOC~~ SOLN
15.0000 [IU] | Freq: Once | SUBCUTANEOUS | Status: AC
  Administered 2013-04-06: 15 [IU] via SUBCUTANEOUS
  Filled 2013-04-06: qty 15

## 2013-04-06 MED ORDER — HYDROMORPHONE HCL PF 1 MG/ML IJ SOLN: 1.0000 mg | Freq: Once | INTRAMUSCULAR | Status: DC

## 2013-04-06 MED ORDER — PROMETHAZINE HCL 25 MG/ML IJ SOLN
25.0000 mg | Freq: Once | INTRAMUSCULAR | Status: DC
  Filled 2013-04-06: qty 1

## 2013-04-06 MED ORDER — PROMETHAZINE HCL 25 MG/ML IJ SOLN
25.0000 mg | Freq: Once | INTRAMUSCULAR | Status: AC
  Administered 2013-04-06: 25 mg via INTRAMUSCULAR
  Filled 2013-04-06: qty 1

## 2013-04-06 MED ORDER — METOCLOPRAMIDE HCL 5 MG/ML IJ SOLN: 10.0000 mg | Freq: Once | INTRAMUSCULAR | Status: DC

## 2013-04-06 MED ORDER — METOCLOPRAMIDE HCL 5 MG/ML IJ SOLN
10.0000 mg | Freq: Once | INTRAMUSCULAR | Status: AC
  Administered 2013-04-06: 10 mg via INTRAMUSCULAR
  Filled 2013-04-06: qty 2

## 2013-04-06 NOTE — ED Notes (Signed)
Bed: [id number] Expected date: 04/06/13 Expected time: 11:56 AM Means of arrival: Ambulance Comments: Abd pain

## 2013-04-06 NOTE — ED Provider Notes (Signed)
CSN: [id number]     Arrival date & time 04/06/13  1206 History   First MD Initiated Contact with Patient 04/06/13 1252     Chief Complaint  Patient presents with  . Abdominal Pain  . Emesis   (Consider location/radiation/quality/duration/timing/severity/associated sxs/prior Treatment) HPI Comments: Patient presents to the ER for evaluation of recurrent abdominal pain with nausea and vomiting. Patient reports a history of gastroparesis with similar symptoms in the past. This has been seen multiple times for this previously. Patient reports that the pain is severe and constant.  Patient is a 42 y.o. female presenting with abdominal pain and vomiting.  Abdominal Pain Associated symptoms: nausea and vomiting   Associated symptoms: no fever   Emesis Associated symptoms: abdominal pain     Past Medical History  Diagnosis Date  . Hypertension   . Diabetes mellitus   . Gastroparesis   . Asthma   . GERD (gastroesophageal reflux disease)   . Coronary artery disease   . h/o seizure   . Neuropathy   . Chronic pancreatitis   . Dyslipidemia   . MI (myocardial infarction)   . COPD (chronic obstructive pulmonary disease)   . Seizures    Past Surgical History  Procedure Laterality Date  . Abdominal hysterectomy    . Cholecystectomy    . Peg tube x 4      feeding jejunostomies with PEG tubes  . Cesarean section    . Colon resection due to diverticulitis    . Back surgery    . Esophagogastroduodenoscopy N/A 12/27/2012    Procedure: ESOPHAGOGASTRODUODENOSCOPY (EGD);  Surgeon: Missy Sabins, MD;  Location: [facility];  Service: Endoscopy;  Laterality: N/A;  . Esophagogastroduodenoscopy (egd) with esophageal dilation N/A 02/19/2013    Procedure: ESOPHAGOGASTRODUODENOSCOPY (EGD) WITH ESOPHAGEAL DILATION;  Surgeon: Wonda Horner, MD;  Location: [facility];  Service: Endoscopy;  Laterality: N/A;  . Balloon dilation N/A 02/19/2013    Procedure: BALLOON DILATION;  Surgeon: Wonda Horner, MD;   Location: [facility];  Service: Endoscopy;  Laterality: N/A;   Family History  Problem Relation Age of Onset  . Cancer Mother    History  Substance Use Topics  . Smoking status: Former Smoker -- 0.25 packs/day for 2 years    Types: Cigarettes  . Smokeless tobacco: Never Used  . Alcohol Use: No     Comment: hx 1 beer daily -pt states she quit Dec. 2012   OB History   Grav Para Term Preterm Abortions TAB SAB Ect Mult Living                 Review of Systems  Constitutional: Negative for fever.  Gastrointestinal: Positive for nausea, vomiting and abdominal pain.  All other systems reviewed and are negative.    Allergies  Penicillins; Cefadroxil; Compazine; Darvocet; Nsaids; and Zofran  Home Medications   Current Outpatient Rx  Name  Route  Sig  Dispense  Refill  . amLODipine (NORVASC) 10 MG tablet   Per Tube   Place 1 tablet (10 mg total) into feeding tube daily.   30 tablet   2   . atorvastatin (LIPITOR) 40 MG tablet   Per Tube   Place 1 tablet (40 mg total) into feeding tube daily at 6 PM.   30 tablet   0   . bisacodyl (DULCOLAX) 10 MG suppository   Rectal   Place 1 suppository (10 mg total) rectally daily.   12 suppository   0   . busPIRone (BUSPAR)  5 MG tablet   Per Tube   Place 5 mg into feeding tube 3 (three) times daily.         Marland Kitchen docusate (COLACE) 50 MG/5ML liquid   Per Tube   Place 10 mLs (100 mg total) into feeding tube 2 (two) times daily.   100 mL   0   . Fluticasone-Salmeterol (ADVAIR) 250-50 MCG/DOSE AEPB   Inhalation   Inhale 1 puff into the lungs 2 (two) times daily.   60 each   0   . gabapentin (NEURONTIN) 300 MG capsule   Per Tube   Place 2 capsules (600 mg total) into feeding tube 3 (three) times daily.   30 capsule   0   . HYDROmorphone (DILAUDID) 2 MG/ML SOLN injection   Intravenous   Inject 1-1.5 mLs (2-3 mg total) into the vein every 2 (two) hours as needed.   1 mL   0   . insulin aspart (NOVOLOG) 100 UNIT/ML  injection   Subcutaneous   Inject 0-9 Units into the skin 3 (three) times daily before meals.   1 vial   12   . lisinopril (PRINIVIL,ZESTRIL) 10 MG tablet      10 mg. 1 tablet (10 mg total) by Per J Tube route daily.         Marland Kitchen LORazepam (ATIVAN) 2 MG/ML injection   Intravenous   Inject 0.25 mLs (0.5 mg total) into the vein every 6 (six) hours as needed for anxiety.   1 mL   0   . metoCLOPramide (REGLAN) 5 MG/ML injection   Intravenous   Inject 2 mLs (10 mg total) into the vein 4 (four) times daily.   2 mL   0   . metoprolol (LOPRESSOR) 1 MG/ML injection   Intravenous   Inject 5 mLs (5 mg total) into the vein every 8 (eight) hours as needed (Please use for systolic blood pressure above 130; hold if HR less than 60).   5 mL   0   . Multiple Vitamin (MULTIVITAMIN WITH MINERALS) TABS   Per Tube   Place 1 tablet into feeding tube daily.   30 tablet   0   . Nutritional Supplements (FEEDING SUPPLEMENT, OSMOLITE 1.2 CAL,) LIQD   Per Tube   Place 1,000 mLs into feeding tube continuous. Rate:40ml/hr         . oxyCODONE (OXY IR/ROXICODONE) 5 MG immediate release tablet   Per Tube   Place 1-2 tablets (5-10 mg total) into feeding tube every 6 (six) hours as needed.   15 tablet   0   . pantoprazole (PROTONIX) 40 MG injection   Intravenous   Inject 40 mg into the vein every 12 (twelve) hours.   1 each   0   . potassium chloride (KLOR-CON) 8 MEQ tablet      8 mEq. Take 1 tablet (8 mEq total) by mouth daily.         . promethazine (PHENERGAN) 25 MG/ML injection   Intravenous   Inject 1 mL (25 mg total) into the vein every 6 (six) hours as needed for nausea.   1 mL   0   . sucralfate (CARAFATE) 1 GM/10ML suspension   Per Tube   Place 10 mLs (1 g total) into feeding tube 4 (four) times daily -  with meals and at bedtime.   420 mL   0   . torsemide (DEMADEX) 10 MG tablet   Oral   Take 10 mg by mouth  daily as needed (swelling).         . zolpidem (AMBIEN) 10  MG tablet   Oral   Take 1 tablet (10 mg total) by mouth at bedtime as needed for sleep.   30 tablet   0    BP 194/97  Pulse 124  Temp(Src) 98.9 F (37.2 C) (Oral)  Resp 24  SpO2 99% Physical Exam  Constitutional: She is oriented to person, place, and time. She appears well-developed and well-nourished. She appears distressed.  HENT:  Head: Normocephalic and atraumatic.  Right Ear: Hearing normal.  Left Ear: Hearing normal.  Nose: Nose normal.  Mouth/Throat: Oropharynx is clear and moist and mucous membranes are normal.  Eyes: Conjunctivae and EOM are normal. Pupils are equal, round, and reactive to light.  Neck: Normal range of motion. Neck supple.  Cardiovascular: Regular rhythm, S1 normal and S2 normal.  Tachycardia present.  Exam reveals no gallop and no friction rub.   No murmur heard. Pulmonary/Chest: Effort normal and breath sounds normal. No respiratory distress. She exhibits no tenderness.  Abdominal: Soft. Normal appearance and bowel sounds are normal. There is no hepatosplenomegaly. There is generalized tenderness. There is no rebound, no guarding, no tenderness at McBurney's point and negative Murphy's sign. No hernia.  Musculoskeletal: Normal range of motion.  Neurological: She is alert and oriented to person, place, and time. She has normal strength. No cranial nerve deficit or sensory deficit. Coordination normal. GCS eye subscore is 4. GCS verbal subscore is 5. GCS motor subscore is 6.  Skin: Skin is warm, dry and intact. No rash noted. No cyanosis.  Psychiatric: She has a normal mood and affect. Her speech is normal and behavior is normal. Thought content normal.    ED Course  Procedures (including critical care time) Labs Review Labs Reviewed  CBC WITH DIFFERENTIAL - Abnormal; Notable for the following:    WBC 11.5 (*)    Neutrophils Relative % 82 (*)    Neutro Abs 9.5 (*)    All other components within normal limits  COMPREHENSIVE METABOLIC PANEL -  Abnormal; Notable for the following:    Sodium 123 (*)    Potassium 3.1 (*)    Chloride 80 (*)    Glucose, Bld 514 (*)    Albumin 3.0 (*)    Alkaline Phosphatase 155 (*)    All other components within normal limits  LIPASE, BLOOD   Imaging Review No results found.  MDM  Diagnosis: 1. Chronic abdominal pain 2. Gastroparesis 3. Uncontrolled diabetes  Patient presents to the ER for evaluation of abdominal pain. Patient said recently with similar complaints. Patient has a  history of chronic abdominal pain secondary to gastroparesis. Patient's blood work was unremarkable other than uncontrolled diabetes without signs of ketosis.  Patient is very difficult to initiate B. Persistent attempt, patient declining any further attempts at this point. She would like I am injections. Cannot administer any IV fluids for elevated blood sugar. Patient administer insulin. She is to continue to watch her sugars at home and use her NovoLog insulin. Patient was given a dose of Dilantin. She was complaining of continued pain, I did agree for one additional shot prior to discharge.    Orpah Greek, MD 04/06/13 [phone]

## 2013-04-06 NOTE — ED Notes (Signed)
Pt escorted to discharge window. Verbalized understanding discharge instructions. In no acute distress.   

## 2013-04-06 NOTE — ED Notes (Addendum)
Per EMS, Pt, from home, c/o constant, sharp and tender abdominal pain x 4 quadrants and emesis since last night.  Pain score 10/10.  Pt took GI medication w/o relief.  Pt has a gastrostomy/enterostomy jejunostomy.  Vitals stable.  CBG 547.  Denies diarrhea.  A & Ox4.  Pt was belching during assessment.

## 2013-04-06 NOTE — ED Notes (Signed)
Charge RN at bedside attempting to start an IV.

## 2013-04-06 NOTE — ED Notes (Signed)
Pt was given a phone to call her ride home.

## 2013-04-10 ENCOUNTER — Encounter (HOSPITAL_COMMUNITY): Payer: Self-pay | Admitting: Emergency Medicine

## 2013-04-10 ENCOUNTER — Inpatient Hospital Stay (HOSPITAL_COMMUNITY)
Admission: EM | Admit: 2013-04-10 | Discharge: 2013-04-16 | DRG: 074 | Disposition: A | Discharge status: Home / Self Care | Payer: Medicare Other | Attending: Internal Medicine | Admitting: Internal Medicine

## 2013-04-10 ENCOUNTER — Emergency Department (HOSPITAL_COMMUNITY): Payer: Medicare Other

## 2013-04-10 DIAGNOSIS — E876 Hypokalemia: Secondary | ICD-10-CM | POA: Diagnosis present

## 2013-04-10 DIAGNOSIS — Z87898 Personal history of other specified conditions: Secondary | ICD-10-CM

## 2013-04-10 DIAGNOSIS — R109 Unspecified abdominal pain: Secondary | ICD-10-CM | POA: Diagnosis present

## 2013-04-10 DIAGNOSIS — Z431 Encounter for attention to gastrostomy: Secondary | ICD-10-CM

## 2013-04-10 DIAGNOSIS — Y849 Medical procedure, unspecified as the cause of abnormal reaction of the patient, or of later complication, without mention of misadventure at the time of the procedure: Secondary | ICD-10-CM | POA: Diagnosis present

## 2013-04-10 DIAGNOSIS — Z79899 Other long term (current) drug therapy: Secondary | ICD-10-CM

## 2013-04-10 DIAGNOSIS — E1140 Type 2 diabetes mellitus with diabetic neuropathy, unspecified: Secondary | ICD-10-CM

## 2013-04-10 DIAGNOSIS — G894 Chronic pain syndrome: Secondary | ICD-10-CM | POA: Diagnosis present

## 2013-04-10 DIAGNOSIS — K3184 Gastroparesis: Secondary | ICD-10-CM | POA: Diagnosis present

## 2013-04-10 DIAGNOSIS — E871 Hypo-osmolality and hyponatremia: Secondary | ICD-10-CM | POA: Diagnosis present

## 2013-04-10 DIAGNOSIS — J4489 Other specified chronic obstructive pulmonary disease: Secondary | ICD-10-CM | POA: Diagnosis present

## 2013-04-10 DIAGNOSIS — Z791 Long term (current) use of non-steroidal anti-inflammatories (NSAID): Secondary | ICD-10-CM

## 2013-04-10 DIAGNOSIS — Z87891 Personal history of nicotine dependence: Secondary | ICD-10-CM

## 2013-04-10 DIAGNOSIS — D72829 Elevated white blood cell count, unspecified: Secondary | ICD-10-CM

## 2013-04-10 DIAGNOSIS — R739 Hyperglycemia, unspecified: Secondary | ICD-10-CM

## 2013-04-10 DIAGNOSIS — K920 Hematemesis: Secondary | ICD-10-CM

## 2013-04-10 DIAGNOSIS — K9429 Other complications of gastrostomy: Secondary | ICD-10-CM | POA: Diagnosis present

## 2013-04-10 DIAGNOSIS — Z794 Long term (current) use of insulin: Secondary | ICD-10-CM

## 2013-04-10 DIAGNOSIS — N390 Urinary tract infection, site not specified: Secondary | ICD-10-CM

## 2013-04-10 DIAGNOSIS — Z931 Gastrostomy status: Secondary | ICD-10-CM

## 2013-04-10 DIAGNOSIS — T85598D Other mechanical complication of other gastrointestinal prosthetic devices, implants and grafts, subsequent encounter: Secondary | ICD-10-CM

## 2013-04-10 DIAGNOSIS — I2510 Atherosclerotic heart disease of native coronary artery without angina pectoris: Secondary | ICD-10-CM | POA: Diagnosis present

## 2013-04-10 DIAGNOSIS — K567 Ileus, unspecified: Secondary | ICD-10-CM

## 2013-04-10 DIAGNOSIS — K861 Other chronic pancreatitis: Secondary | ICD-10-CM | POA: Diagnosis present

## 2013-04-10 DIAGNOSIS — J449 Chronic obstructive pulmonary disease, unspecified: Secondary | ICD-10-CM | POA: Diagnosis present

## 2013-04-10 DIAGNOSIS — R112 Nausea with vomiting, unspecified: Secondary | ICD-10-CM

## 2013-04-10 DIAGNOSIS — E860 Dehydration: Secondary | ICD-10-CM

## 2013-04-10 DIAGNOSIS — IMO0002 Reserved for concepts with insufficient information to code with codable children: Secondary | ICD-10-CM | POA: Diagnosis present

## 2013-04-10 DIAGNOSIS — I10 Essential (primary) hypertension: Secondary | ICD-10-CM | POA: Diagnosis present

## 2013-04-10 DIAGNOSIS — N179 Acute kidney failure, unspecified: Secondary | ICD-10-CM

## 2013-04-10 DIAGNOSIS — E1149 Type 2 diabetes mellitus with other diabetic neurological complication: Principal | ICD-10-CM | POA: Diagnosis present

## 2013-04-10 DIAGNOSIS — E1142 Type 2 diabetes mellitus with diabetic polyneuropathy: Secondary | ICD-10-CM | POA: Diagnosis present

## 2013-04-10 DIAGNOSIS — I252 Old myocardial infarction: Secondary | ICD-10-CM

## 2013-04-10 DIAGNOSIS — E1165 Type 2 diabetes mellitus with hyperglycemia: Secondary | ICD-10-CM

## 2013-04-10 DIAGNOSIS — K219 Gastro-esophageal reflux disease without esophagitis: Secondary | ICD-10-CM

## 2013-04-10 DIAGNOSIS — R4702 Dysphasia: Secondary | ICD-10-CM

## 2013-04-10 DIAGNOSIS — E785 Hyperlipidemia, unspecified: Secondary | ICD-10-CM | POA: Diagnosis present

## 2013-04-10 DIAGNOSIS — K5900 Constipation, unspecified: Secondary | ICD-10-CM

## 2013-04-10 NOTE — ED Notes (Signed)
Bed: WA08 Expected date: 04/10/13 Expected time: 10:46 PM Means of arrival: Ambulance Comments: 41 yo F  abd pain, vomiting

## 2013-04-10 NOTE — ED Notes (Addendum)
Attempted blood draw but unsuccessful.

## 2013-04-10 NOTE — ED Notes (Signed)
Per ems pt here for c/o abd pain x7 days with n /v taken Phenergan no relief

## 2013-04-11 ENCOUNTER — Inpatient Hospital Stay (HOSPITAL_COMMUNITY): Payer: Medicare Other

## 2013-04-11 DIAGNOSIS — E876 Hypokalemia: Secondary | ICD-10-CM

## 2013-04-11 DIAGNOSIS — Z431 Encounter for attention to gastrostomy: Secondary | ICD-10-CM

## 2013-04-11 DIAGNOSIS — E871 Hypo-osmolality and hyponatremia: Secondary | ICD-10-CM

## 2013-04-11 DIAGNOSIS — K3184 Gastroparesis: Secondary | ICD-10-CM

## 2013-04-11 DIAGNOSIS — R109 Unspecified abdominal pain: Secondary | ICD-10-CM

## 2013-04-11 DIAGNOSIS — K219 Gastro-esophageal reflux disease without esophagitis: Secondary | ICD-10-CM

## 2013-04-11 DIAGNOSIS — I10 Essential (primary) hypertension: Secondary | ICD-10-CM

## 2013-04-11 DIAGNOSIS — R1115 Cyclical vomiting syndrome unrelated to migraine: Secondary | ICD-10-CM

## 2013-04-11 LAB — CBC WITH DIFFERENTIAL/PLATELET
Basophils Absolute: 0 10*3/uL (ref 0.0–0.1)
Basophils Relative: 0 % (ref 0–1)
Eosinophils Absolute: 0 10*3/uL (ref 0.0–0.7)
Eosinophils Relative: 0 % (ref 0–5)
HCT: 37.4 % (ref 36.0–46.0)
Hemoglobin: 13 g/dL (ref 12.0–15.0)
MCH: 29.4 pg (ref 26.0–34.0)
MCHC: 34.8 g/dL (ref 30.0–36.0)
MCV: 84.6 fL (ref 78.0–100.0)
Monocytes Absolute: 0.3 10*3/uL (ref 0.1–1.0)
Monocytes Relative: 3 % (ref 3–12)
Neutro Abs: 8.6 10*3/uL — ABNORMAL HIGH (ref 1.7–7.7)
RDW: 13.6 % (ref 11.5–15.5)

## 2013-04-11 LAB — COMPREHENSIVE METABOLIC PANEL
AST: 17 U/L (ref 0–37)
Albumin: 3.2 g/dL — ABNORMAL LOW (ref 3.5–5.2)
BUN: 9 mg/dL (ref 6–23)
Calcium: 9.4 mg/dL (ref 8.4–10.5)
Chloride: 83 mEq/L — ABNORMAL LOW (ref 96–112)
Creatinine, Ser: 0.46 mg/dL — ABNORMAL LOW (ref 0.50–1.10)
GFR calc non Af Amer: >90 mL/min (ref >90)
Total Bilirubin: 0.3 mg/dL (ref 0.3–1.2)

## 2013-04-11 LAB — URINALYSIS, ROUTINE W REFLEX MICROSCOPIC
Ketones, ur: NEGATIVE mg/dL
Leukocytes, UA: NEGATIVE
Nitrite: NEGATIVE
Protein, ur: >300 mg/dL — AB
pH: 8 (ref 5.0–8.0)

## 2013-04-11 LAB — GLUCOSE, CAPILLARY
Glucose-Capillary: 377 mg/dL — ABNORMAL HIGH (ref 70–99)
Glucose-Capillary: 301 mg/dL — ABNORMAL HIGH (ref 70–99)
Glucose-Capillary: 159 mg/dL — ABNORMAL HIGH (ref 70–99)
Glucose-Capillary: 121 mg/dL — ABNORMAL HIGH (ref 70–99)
Glucose-Capillary: 172 mg/dL — ABNORMAL HIGH (ref 70–99)

## 2013-04-11 LAB — URINE MICROSCOPIC-ADD ON

## 2013-04-11 LAB — LIPASE, BLOOD: Lipase: 37 U/L (ref 11–59)

## 2013-04-11 MED ORDER — HYDROMORPHONE HCL PF 2 MG/ML IJ SOLN: 2.0000 mg | INTRAMUSCULAR | Status: DC | PRN

## 2013-04-11 MED ORDER — METOCLOPRAMIDE HCL 5 MG/ML IJ SOLN
10.0000 mg | Freq: Four times a day (QID) | INTRAMUSCULAR | Status: DC
  Administered 2013-04-11: 06:00:00 10 mg via INTRAMUSCULAR
  Filled 2013-04-11: qty 2

## 2013-04-11 MED ORDER — PROMETHAZINE HCL 25 MG/ML IJ SOLN: 25.0000 mg | Freq: Three times a day (TID) | INTRAMUSCULAR | Status: DC | PRN

## 2013-04-11 MED ORDER — SODIUM CHLORIDE 0.9 % IV SOLN
INTRAVENOUS | Status: DC
  Administered 2013-04-11: 16:00:00 1000 mL via INTRAVENOUS
  Administered 2013-04-12 – 2013-04-15 (×4): via INTRAVENOUS

## 2013-04-11 MED ORDER — POTASSIUM CHLORIDE 10 MEQ/100ML IV SOLN
10.0000 meq | INTRAVENOUS | Status: AC
  Administered 2013-04-11 (×6): 10 meq via INTRAVENOUS
  Filled 2013-04-11 (×6): qty 100

## 2013-04-11 MED ORDER — PROMETHAZINE HCL 25 MG/ML IJ SOLN
25.0000 mg | Freq: Once | INTRAMUSCULAR | Status: AC
  Administered 2013-04-11: 25 mg via INTRAMUSCULAR
  Filled 2013-04-11: qty 1

## 2013-04-11 MED ORDER — ENOXAPARIN SODIUM 40 MG/0.4ML ~~LOC~~ SOLN
40.0000 mg | SUBCUTANEOUS | Status: DC
  Administered 2013-04-11 – 2013-04-16 (×6): 40 mg via SUBCUTANEOUS
  Filled 2013-04-11 (×6): qty 0.4

## 2013-04-11 MED ORDER — SODIUM CHLORIDE 0.9 % IJ SOLN
3.0000 mL | Freq: Two times a day (BID) | INTRAMUSCULAR | Status: DC
Start: 2013-04-11 — End: 2013-04-13

## 2013-04-11 MED ORDER — IOHEXOL 300 MG/ML  SOLN
50.0000 mL | Freq: Once | INTRAMUSCULAR | Status: AC | PRN
  Administered 2013-04-11: 14:00:00 10 mL

## 2013-04-11 MED ORDER — HYDROMORPHONE HCL PF 2 MG/ML IJ SOLN
2.0000 mg | Freq: Once | INTRAMUSCULAR | Status: AC
  Administered 2013-04-11: 2 mg via INTRAMUSCULAR
  Filled 2013-04-11: qty 1

## 2013-04-11 MED ORDER — PROMETHAZINE HCL 25 MG/ML IJ SOLN
INTRAMUSCULAR | Status: AC
  Filled 2013-04-11: qty 1

## 2013-04-11 MED ORDER — PROMETHAZINE HCL 25 MG/ML IJ SOLN
25.0000 mg | Freq: Once | INTRAMUSCULAR | Status: AC
  Administered 2013-04-11: 25 mg via INTRAVENOUS

## 2013-04-11 MED ORDER — INSULIN ASPART 100 UNIT/ML ~~LOC~~ SOLN
0.0000 [IU] | SUBCUTANEOUS | Status: DC
  Administered 2013-04-11: 12:00:00 1 [IU] via SUBCUTANEOUS
  Administered 2013-04-11: 9 [IU] via SUBCUTANEOUS
  Administered 2013-04-11: 09:00:00 7 [IU] via SUBCUTANEOUS
  Administered 2013-04-11: 18:00:00 1 [IU] via SUBCUTANEOUS
  Administered 2013-04-11: 2 [IU] via SUBCUTANEOUS
  Administered 2013-04-12 – 2013-04-14 (×6): 1 [IU] via SUBCUTANEOUS
  Administered 2013-04-14: 12:00:00 2 [IU] via SUBCUTANEOUS
  Administered 2013-04-15: 13:00:00 3 [IU] via SUBCUTANEOUS
  Administered 2013-04-15: 2 [IU] via SUBCUTANEOUS
  Administered 2013-04-15: 1 [IU] via SUBCUTANEOUS
  Administered 2013-04-15 – 2013-04-16 (×2): 2 [IU] via SUBCUTANEOUS
  Administered 2013-04-16: 08:00:00 5 [IU] via SUBCUTANEOUS
  Administered 2013-04-16 (×2): 3 [IU] via SUBCUTANEOUS

## 2013-04-11 MED ORDER — SODIUM CHLORIDE 0.9 % IJ SOLN: 3.0000 mL | INTRAMUSCULAR | Status: DC | PRN

## 2013-04-11 MED ORDER — PROMETHAZINE HCL 25 MG/ML IJ SOLN
12.5000 mg | Freq: Four times a day (QID) | INTRAMUSCULAR | Status: DC | PRN
  Administered 2013-04-11 – 2013-04-16 (×17): 12.5 mg via INTRAVENOUS
  Filled 2013-04-11 (×17): qty 1

## 2013-04-11 MED ORDER — CLONIDINE HCL 0.2 MG/24HR TD PTWK
0.2000 mg | MEDICATED_PATCH | TRANSDERMAL | Status: DC
  Administered 2013-04-11: 0.2 mg via TRANSDERMAL
  Filled 2013-04-11: qty 1

## 2013-04-11 MED ORDER — SODIUM CHLORIDE 0.9 % IV SOLN: 250.0000 mL | INTRAVENOUS | Status: DC | PRN

## 2013-04-11 MED ORDER — LIDOCAINE HCL 1 % IJ SOLN
INTRAMUSCULAR | Status: AC
  Filled 2013-04-11: qty 20

## 2013-04-11 MED ORDER — METOCLOPRAMIDE HCL 5 MG/ML IJ SOLN
10.0000 mg | Freq: Four times a day (QID) | INTRAMUSCULAR | Status: DC
  Administered 2013-04-11 – 2013-04-16 (×20): 10 mg via INTRAVENOUS
  Filled 2013-04-11 (×25): qty 2

## 2013-04-11 MED ORDER — HYDROMORPHONE HCL PF 2 MG/ML IJ SOLN
2.0000 mg | INTRAMUSCULAR | Status: DC | PRN
  Administered 2013-04-11: 2 mg via INTRAMUSCULAR
  Filled 2013-04-11: qty 1

## 2013-04-11 MED ORDER — HYDROMORPHONE HCL PF 1 MG/ML IJ SOLN
1.0000 mg | INTRAMUSCULAR | Status: DC | PRN
  Administered 2013-04-11 – 2013-04-16 (×26): 1 mg via INTRAVENOUS
  Filled 2013-04-11 (×27): qty 1

## 2013-04-11 NOTE — Care Management Note (Addendum)
    Page 1 of 1   04/16/2013     2:32:10 PM   CARE MANAGEMENT NOTE 04/16/2013  Patient:  [name], [name]   Account Number:  [id number]  Date Initiated:  04/11/2013  Documentation initiated by:  Dessa Phi  Subjective/Objective Assessment:   42Y/O F ADMITTED W/N/V.HX:GASTROPARESIS,DM,COPD.     Action/Plan:   FROM HOME.GT-TF-INDEP.DECLINED [facility] IN PAST.   Anticipated DC Date:  04/16/2013   Anticipated DC Plan:  [place]  CM consult      Choice offered to / List presented to:             Status of service:  Completed, signed off Medicare Important Message given?   (If response is "NO", the following Medicare IM given date fields will be blank) Date Medicare IM given:   Date Additional Medicare IM given:    Discharge Disposition:  HOME/SELF CARE  Per UR Regulation:  Reviewed for med. necessity/level of care/duration of stay  If discussed at [place] of Stay Meetings, dates discussed:   04/16/2013    Comments:  04/16/13 Markel Mergenthaler RN,BSN NCM [phone] D/C HOME, NO NEEDS OR ORDERS.INDEP W/GT-TF.  [id number] Rosana Hoes, RN, BSN, CCM [phone] Chart Reviewed for discharge and hospital needs. Discharge needs at time of review:  None Review of patient progress due on [id number].   04/11/13 Farhiya Rosten RN,BSN NCM [place].IR CONS.[facility] SERVICES HAVE LEFT INFO IN RM.

## 2013-04-11 NOTE — Progress Notes (Signed)
Patient seen and examined. Was admitted earlier today for n/v. Has gone down to IR for PICC placement. Will order IV KCl as well as IV pain and nausea meds. IR will attempt to fix G-Tube in am.  Domingo Mend, MD [facility] Pager: [phone]

## 2013-04-11 NOTE — Progress Notes (Signed)
Inpatient Diabetes Program Recommendations  AACE/ADA: New Consensus Statement on Inpatient Glycemic Control (2013)  Target Ranges:  Prepandial:   less than 140 mg/dL      Peak postprandial:   less than 180 mg/dL (1-2 hours)      Critically ill patients:  140 - 180 mg/dL   Results for CAILI, KRAUSER (MRN [id number]) as of 04/11/2013 09:55  Ref. Range 04/11/2013 05:38 04/11/2013 08:16  Glucose-Capillary Latest Range: 70-99 mg/dL 377 (H) 301 (H)    Inpatient Diabetes Program Recommendations Insulin - Basal: consider adding low dose basal insulin for fasting >300 Thank you  Raoul Pitch BSN, RN,CDE Inpatient Diabetes Coordinator [phone] (team pager)

## 2013-04-11 NOTE — Progress Notes (Signed)
INITIAL NUTRITION ASSESSMENT  DOCUMENTATION CODES Per approved criteria  -Not Applicable   INTERVENTION: Tube feeding recommendations when pt is medically ready: Recommend Initiating Osmolite 1.2 @ 20 ml/hr via G-tube and increase by 10 ml every 4 hours to goal rate of 65 ml/hr. At goal rate, tube feeding regimen will provide 1872 kcal, 86 grams of protein, and 1279 ml of H2O. Provide 100 ml free water flushes every 4 hours to provide a total of 1879 ml H2O daily.   NUTRITION DIAGNOSIS: Inadequate oral intake related to inability to eat as evidenced by NPO status and G-tube.   Goal: Pt to meet >/= 90% of their estimated nutrition needs   Monitor:  TF initiation/tolerance Weight Labs  Reason for Assessment: Malnutrition Screening Tool, score of 3  42 y.o. female  Admitting Dx: Intractable nausea and vomiting  ASSESSMENT: 42 yo female h/o gastroparesis, dm, s/p gtube placement last month, seizure d/o, copd, chronic pain syndrome comes in with n/v and abd pain today. Has not been able to keep any of her meds down. This is similar to her previous gastroparesis flares. Denies any fevers. Denies any blood in vomit. No diarrhea.  Pt asleep at time of visit. Per RN pt is still complaining of nausea and pt to go to IR today for G-tube repositioning and PICC placement. Per RN hope to start tube feedings later today. Per chart pt was receiving Osmolite 1.2 @ 60 ml/hr which provides 1728 kcal, 80 grams of protein, and 1180 ml of H2O. Per weight history pt has lost 10 lbs (7% wt loss) in less than 3 months.  Height: Ht Readings from Last 1 Encounters:  04/11/13 5\' 4"  (1.626 m)    Weight: Wt Readings from Last 1 Encounters:  04/11/13 135 lb 2.3 oz (61.3 kg)    Ideal Body Weight: 120 lbs  % Ideal Body Weight: 113%  Wt Readings from Last 10 Encounters:  04/11/13 135 lb 2.3 oz (61.3 kg)  02/17/13 139 lb 1.8 oz (63.1 kg)  02/17/13 139 lb 1.8 oz (63.1 kg)  02/06/13 141 lb 1.5 oz (64  kg)  01/19/13 145 lb 4.5 oz (65.9 kg)  12/26/12 152 lb (68.947 kg)  12/26/12 152 lb (68.947 kg)  03/14/12 128 lb 1.4 oz (58.1 kg)  03/14/12 128 lb 1.4 oz (58.1 kg)  03/14/12 128 lb 1.4 oz (58.1 kg)    Usual Body Weight: 152 lbs (May 2014)  % Usual Body Weight: 89%  BMI:  Body mass index is 23.19 kg/(m^2).  Estimated Nutritional Needs: Kcal: 1700-1900 Protein: 90-100 grams Fluid: 1.9-2.1 L/day  Skin: intact  Diet Order: NPO  EDUCATION NEEDS: -No education needs identified at this time   Intake/Output Summary (Last 24 hours) at 04/11/13 1112 Last data filed at 04/11/13 0500  Gross per 24 hour  Intake      0 ml  Output   1200 ml  Net  -1200 ml    Last BM: 9/10  Labs:   Recent Labs Lab 04/06/13 1400 04/11/13 0140  NA 123* 127*  K 3.1* 2.7*  CL 80* 83*  CO2 29 28  BUN 16 9  CREATININE 0.51 0.46*  CALCIUM 9.7 9.4  GLUCOSE 514* 388*    CBG (last 3)   Recent Labs  04/11/13 0538 04/11/13 0816  GLUCAP 377* 301*    Scheduled Meds: . cloNIDine  0.2 mg Transdermal Weekly  . enoxaparin (LOVENOX) injection  40 mg Subcutaneous Q24H  . insulin aspart  0-9 Units Subcutaneous Q4H  .  metoCLOPramide (REGLAN) injection  10 mg Intramuscular Q6H  . sodium chloride  3 mL Intravenous Q12H  . sodium chloride  3 mL Intravenous Q12H    Continuous Infusions:   Past Medical History  Diagnosis Date  . Hypertension   . Diabetes mellitus   . Gastroparesis   . Asthma   . GERD (gastroesophageal reflux disease)   . Coronary artery disease   . h/o seizure   . Neuropathy   . Chronic pancreatitis   . Dyslipidemia   . MI (myocardial infarction)   . COPD (chronic obstructive pulmonary disease)   . Seizures     Past Surgical History  Procedure Laterality Date  . Abdominal hysterectomy    . Cholecystectomy    . Peg tube x 4      feeding jejunostomies with PEG tubes  . Cesarean section    . Colon resection due to diverticulitis    . Back surgery    .  Esophagogastroduodenoscopy N/A 12/27/2012    Procedure: ESOPHAGOGASTRODUODENOSCOPY (EGD);  Surgeon: Missy Sabins, MD;  Location: [facility];  Service: Endoscopy;  Laterality: N/A;  . Esophagogastroduodenoscopy (egd) with esophageal dilation N/A 02/19/2013    Procedure: ESOPHAGOGASTRODUODENOSCOPY (EGD) WITH ESOPHAGEAL DILATION;  Surgeon: Wonda Horner, MD;  Location: [facility];  Service: Endoscopy;  Laterality: N/A;  . Balloon dilation N/A 02/19/2013    Procedure: BALLOON DILATION;  Surgeon: Wonda Horner, MD;  Location: [facility];  Service: Endoscopy;  Laterality: N/A;    Pryor Ochoa RD, LDN Inpatient Clinical Dietitian Pager: [phone] After Hours Pager: [phone]

## 2013-04-11 NOTE — Progress Notes (Signed)
Called in attempt to receive report on this patient at 0411 AM. ED RN currently in patient room, said will call back in a few minutes.

## 2013-04-11 NOTE — ED Notes (Signed)
Vernita RN notified of critical potassium--states will notify Dr Sharol Given

## 2013-04-11 NOTE — ED Provider Notes (Signed)
CSN: [id number]     Arrival date & time 04/10/13  2252 History   First MD Initiated Contact with Patient 04/10/13 2302     Chief Complaint  Patient presents with  . Abdominal Pain   (Consider location/radiation/quality/duration/timing/severity/associated sxs/prior Treatment) HPI 42 year old female presents to emergency department from home via EMS with complaint of persistent abdominal pain and nausea and vomiting.  She reports symptoms have been ongoing "forever" but worse over the last week.  She was seen in emergency apartment on [address].  She has had multiple admissions in the past.  Patient has history of gastroparesis.  She is status post a gastrostomy jejunal tube.  She has been taking her Phenergan without relief of symptoms.  He reports diffuse abdominal pain that is sharp and severe.  No fevers no chills.  No diarrhea Past Medical History  Diagnosis Date  . Hypertension   . Diabetes mellitus   . Gastroparesis   . Asthma   . GERD (gastroesophageal reflux disease)   . Coronary artery disease   . h/o seizure   . Neuropathy   . Chronic pancreatitis   . Dyslipidemia   . MI (myocardial infarction)   . COPD (chronic obstructive pulmonary disease)   . Seizures    Past Surgical History  Procedure Laterality Date  . Abdominal hysterectomy    . Cholecystectomy    . Peg tube x 4      feeding jejunostomies with PEG tubes  . Cesarean section    . Colon resection due to diverticulitis    . Back surgery    . Esophagogastroduodenoscopy N/A 12/27/2012    Procedure: ESOPHAGOGASTRODUODENOSCOPY (EGD);  Surgeon: Missy Sabins, MD;  Location: [facility];  Service: Endoscopy;  Laterality: N/A;  . Esophagogastroduodenoscopy (egd) with esophageal dilation N/A 02/19/2013    Procedure: ESOPHAGOGASTRODUODENOSCOPY (EGD) WITH ESOPHAGEAL DILATION;  Surgeon: Wonda Horner, MD;  Location: [facility];  Service: Endoscopy;  Laterality: N/A;  . Balloon dilation N/A 02/19/2013    Procedure: BALLOON  DILATION;  Surgeon: Wonda Horner, MD;  Location: [facility];  Service: Endoscopy;  Laterality: N/A;   Family History  Problem Relation Age of Onset  . Cancer Mother    History  Substance Use Topics  . Smoking status: Former Smoker -- 0.25 packs/day for 2 years    Types: Cigarettes  . Smokeless tobacco: Never Used  . Alcohol Use: No     Comment: hx 1 beer daily -pt states she quit Dec. 2012   OB History   Grav Para Term Preterm Abortions TAB SAB Ect Mult Living                 Review of Systems  All other systems reviewed and are negative.   other than listed in history of present illness  Allergies  Penicillins; Cefadroxil; Compazine; Darvocet; Nsaids; and Zofran  Home Medications   Current Outpatient Rx  Name  Route  Sig  Dispense  Refill  . amLODipine (NORVASC) 10 MG tablet   Per Tube   Place 1 tablet (10 mg total) into feeding tube daily.   30 tablet   2   . atorvastatin (LIPITOR) 40 MG tablet   Per Tube   Place 1 tablet (40 mg total) into feeding tube daily at 6 PM.   30 tablet   0   . bisacodyl (DULCOLAX) 10 MG suppository   Rectal   Place 1 suppository (10 mg total) rectally daily.   12 suppository   0   .  busPIRone (BUSPAR) 5 MG tablet   Per Tube   Place 5 mg into feeding tube 3 (three) times daily.         Marland Kitchen docusate (COLACE) 50 MG/5ML liquid   Per Tube   Place 10 mLs (100 mg total) into feeding tube 2 (two) times daily.   100 mL   0   . Fluticasone-Salmeterol (ADVAIR) 250-50 MCG/DOSE AEPB   Inhalation   Inhale 1 puff into the lungs 2 (two) times daily.   60 each   0   . gabapentin (NEURONTIN) 300 MG capsule   Per Tube   Place 2 capsules (600 mg total) into feeding tube 3 (three) times daily.   30 capsule   0   . insulin aspart (NOVOLOG) 100 UNIT/ML injection   Subcutaneous   Inject 0-9 Units into the skin 3 (three) times daily before meals.   1 vial   12   . lisinopril (PRINIVIL,ZESTRIL) 10 MG tablet      10 mg. 1 tablet (10  mg total) by Per J Tube route daily.         Marland Kitchen LORazepam (ATIVAN) 2 MG/ML injection   Intravenous   Inject 0.25 mLs (0.5 mg total) into the vein every 6 (six) hours as needed for anxiety.   1 mL   0   . metoCLOPramide (REGLAN) 5 MG/ML injection   Intravenous   Inject 2 mLs (10 mg total) into the vein 4 (four) times daily.   2 mL   0   . metoprolol (LOPRESSOR) 1 MG/ML injection   Intravenous   Inject 5 mLs (5 mg total) into the vein every 8 (eight) hours as needed (Please use for systolic blood pressure above 130; hold if HR less than 60).   5 mL   0   . Multiple Vitamin (MULTIVITAMIN WITH MINERALS) TABS   Per Tube   Place 1 tablet into feeding tube daily.   30 tablet   0   . Nutritional Supplements (FEEDING SUPPLEMENT, OSMOLITE 1.2 CAL,) LIQD   Per Tube   Place 1,000 mLs into feeding tube continuous. Rate:65ml/hr         . oxyCODONE (OXY IR/ROXICODONE) 5 MG immediate release tablet   Per Tube   Place 1-2 tablets (5-10 mg total) into feeding tube every 6 (six) hours as needed.   15 tablet   0   . pantoprazole (PROTONIX) 40 MG injection   Intravenous   Inject 40 mg into the vein every 12 (twelve) hours.   1 each   0   . potassium chloride (KLOR-CON) 8 MEQ tablet      8 mEq. Take 1 tablet (8 mEq total) by mouth daily.         . promethazine (PHENERGAN) 25 MG/ML injection   Intravenous   Inject 1 mL (25 mg total) into the vein every 6 (six) hours as needed for nausea.   1 mL   0   . sucralfate (CARAFATE) 1 GM/10ML suspension   Per Tube   Place 10 mLs (1 g total) into feeding tube 4 (four) times daily -  with meals and at bedtime.   420 mL   0   . torsemide (DEMADEX) 10 MG tablet   Oral   Take 10 mg by mouth daily as needed (swelling).         . zolpidem (AMBIEN) 10 MG tablet   Oral   Take 1 tablet (10 mg total) by mouth at bedtime as  needed for sleep.   30 tablet   0   . HYDROmorphone (DILAUDID) 2 MG/ML SOLN injection   Intravenous   Inject  1-1.5 mLs (2-3 mg total) into the vein every 2 (two) hours as needed.   1 mL   0    BP 171/101  Pulse 103  Temp(Src) 98.3 F (36.8 C) (Oral)  Resp 16  SpO2 96% Physical Exam  Nursing note and vitals reviewed. Constitutional: She is oriented to person, place, and time. She appears distressed (patient is vomiting.  When I amin the room with her.  She appears distressed uncomfortable, agitated).  HENT:  Head: Normocephalic and atraumatic.  Nose: Nose normal.  Dry mucous membranes  Eyes: Conjunctivae and EOM are normal. Pupils are equal, round, and reactive to light.  Neck: Normal range of motion. Neck supple. No JVD present. No tracheal deviation present. No thyromegaly present.  Cardiovascular: Normal rate, regular rhythm, normal heart sounds and intact distal pulses.  Exam reveals no gallop and no friction rub.   No murmur heard. Pulmonary/Chest: Effort normal and breath sounds normal. No stridor. No respiratory distress. She has no wheezes. She has no rales. She exhibits no tenderness.  Abdominal: Soft. Bowel sounds are normal. She exhibits no distension and no mass. There is tenderness (severe tenderness in all quadrants). There is no rebound and no guarding.  Musculoskeletal: Normal range of motion. She exhibits no edema and no tenderness.  Lymphadenopathy:    She has no cervical adenopathy.  Neurological: She is alert and oriented to person, place, and time. She exhibits normal muscle tone. Coordination normal.  Skin: Skin is warm and dry. No rash noted. No erythema. No pallor.  Psychiatric: She has a normal mood and affect. Her behavior is normal. Judgment and thought content normal.    ED Course  Procedures (including critical care time) Labs Review Labs Reviewed  CBC WITH DIFFERENTIAL - Abnormal; Notable for the following:    WBC 10.6 (*)    Neutrophils Relative % 81 (*)    Neutro Abs 8.6 (*)    All other components within normal limits  COMPREHENSIVE METABOLIC PANEL -  Abnormal; Notable for the following:    Sodium 127 (*)    Potassium 2.7 (*)    Chloride 83 (*)    Glucose, Bld 388 (*)    Creatinine, Ser 0.46 (*)    Albumin 3.2 (*)    Alkaline Phosphatase 131 (*)    All other components within normal limits  URINALYSIS, ROUTINE W REFLEX MICROSCOPIC - Abnormal; Notable for the following:    Glucose, UA >1000 (*)    Protein, ur >300 (*)    All other components within normal limits  URINE MICROSCOPIC-ADD ON - Abnormal; Notable for the following:    Squamous Epithelial / LPF MANY (*)    All other components within normal limits  LIPASE, BLOOD   Imaging Review Dg Abd Acute W/chest  04/11/2013   *RADIOLOGY REPORT*  Clinical Data: Upper abdominal pain and vomiting for 5 days.  ACUTE ABDOMEN SERIES (ABDOMEN 2 VIEW & CHEST 1 VIEW)  Comparison: 03/31/2013  Findings: Normal heart size and pulmonary vascularity.  No focal airspace consolidation in the lungs.  Mild peribronchial thickening and central interstitial changes likely representing chronic bronchitis.  No blunting of costophrenic angles.  No pneumothorax. There is a gastrostomy tube which extends up into the mid esophagus in the loops down work with tip in the left upper quadrant.  The course of the tube is  stable since previous study.  Gas and stool in the colon.  No small or large bowel distension. No free intra-abdominal air.  No abnormal air fluid levels. Surgical clips in the right upper quadrant with bowel staples in the mid abdomen bilaterally.  Postoperative changes in the lower lumbar spine.  No radiopaque stones.  Calcified phleboliths in the pelvis.  IMPRESSION: No evidence of active pulmonary disease.  Chronic bronchitic changes.  Nonobstructive bowel gas pattern.  Malpositioned gastrostomy tube is stable since previous study.   Original Report Authenticated By: Lucienne Capers, M.D.    MDM   1. Gastroparesis   2. Hypokalemia   3. Feeding tube dysfunction, subsequent encounter   4.  Hyperglycemia   5. Intractable nausea and vomiting   6. Hypertension   7. DM (diabetes mellitus), type 2, uncontrolled     42 year old female with intractable nausea and vomiting and abdominal pain.  She has history of gastroparesis, hypertension, diabetes.  Patient's gastrostomy tube is malpositioned on the x-ray, she's had this problem in the past, and required INR to manipulate it.  Patient also has difficult IV access, and has required PICC lines in the past.  Will discuss with hospitalist for admission.    Kalman Drape, MD 04/11/13 [id number]

## 2013-04-11 NOTE — Clinical Documentation Improvement (Signed)
THIS DOCUMENT IS NOT A PERMANENT PART OF THE MEDICAL RECORD  Please update your documentation with the medical record to reflect your response to this query. If you need help knowing how to do this please call [phone].  04/11/13   Dear Dr. Jerilee Hoh [facility],  In a better effort to capture your patient's severity of illness, reflect appropriate length of stay and utilization of resources, a review of the patient medical record has revealed the following indicators.    Based on your clinical judgment, please clarify and document in a progress note and/or discharge summary the clinical condition associated with the following supporting information:  In responding to this query please exercise your independent judgment.  The fact that a query is asked, does not imply that any particular answer is desired or expected.   Clarification Needed   Please clarify if there is a link between the gastroparesis and pt's DM and document in pn or d/c summary     Please clarify and specify Diabetes type, control, manifestations, and associated conditions.  Possible Clinical Conditions?   _______Diabetes Type  1 or 2 _______Controlled or uncontrolled  Manifestations:  _______DM retinopathy  _______DM PVD _______DM neuropathy   _______DM nephropathy  Associated conditions: _______DM cellulitis _______DM gangrene _______DM gastroparesis _______DM hyperosmolarity state _______DM ketoacidosis with or without coma _______DM osteomyelitis _______DM skin ulcer  _______Other Condition _______Cannot Clinically determine     Supporting Information:  Risk Factors: Gastroparesis H/O DM Hyponatremia HTN  Signs & Symptoms:  Diagnostics: Component Glucose-Capillary  Latest Ref Rng 70 - 99 mg/dL  04/11/2013 377 (H)   Treatment: Monitoring insulin aspart (novoLOG) injection 0-9 Units     You may use possible, probable, or suspect with inpatient documentation.  possible, probable, suspected diagnoses MUST be documented at the time of discharge  Reviewed:  no additional documentation provided ljh   Thank You,  Heloise Beecham  RN, BSN, MSN/Inf, CCDS Clinical Documentation Specialist [facility] Pager: [phone] / E-mail: [email or web address]  [place]

## 2013-04-11 NOTE — H&P (Signed)
PCP:   [name], MD   Chief Complaint:  n/v  HPI: 42 yo female h/o gastroparesis, dm, s/p gtube placement last month, seizure d/o, copd, chronic pain syndrome comes in with n/v and abd pain today.  Has not been able to keep any of her meds down.  This is similar to her previous gastroparesis flares.  Denies any fevers.  Denies any blood in vomit.  No diarrhea.    Review of Systems:  Positive and negative as per HPI otherwise all other systems are negative  Past Medical History: Past Medical History  Diagnosis Date  . Hypertension   . Diabetes mellitus   . Gastroparesis   . Asthma   . GERD (gastroesophageal reflux disease)   . Coronary artery disease   . h/o seizure   . Neuropathy   . Chronic pancreatitis   . Dyslipidemia   . MI (myocardial infarction)   . COPD (chronic obstructive pulmonary disease)   . Seizures    Past Surgical History  Procedure Laterality Date  . Abdominal hysterectomy    . Cholecystectomy    . Peg tube x 4      feeding jejunostomies with PEG tubes  . Cesarean section    . Colon resection due to diverticulitis    . Back surgery    . Esophagogastroduodenoscopy N/A 12/27/2012    Procedure: ESOPHAGOGASTRODUODENOSCOPY (EGD);  Surgeon: Missy Sabins, MD;  Location: [facility];  Service: Endoscopy;  Laterality: N/A;  . Esophagogastroduodenoscopy (egd) with esophageal dilation N/A 02/19/2013    Procedure: ESOPHAGOGASTRODUODENOSCOPY (EGD) WITH ESOPHAGEAL DILATION;  Surgeon: Wonda Horner, MD;  Location: [facility];  Service: Endoscopy;  Laterality: N/A;  . Balloon dilation N/A 02/19/2013    Procedure: BALLOON DILATION;  Surgeon: Wonda Horner, MD;  Location: [facility];  Service: Endoscopy;  Laterality: N/A;    Medications: Prior to Admission medications   Medication Sig Start Date End Date Taking? Authorizing Provider  amLODipine (NORVASC) 10 MG tablet Place 1 tablet (10 mg total) into feeding tube daily. 02/20/13  Yes Robbie Lis, MD   atorvastatin (LIPITOR) 40 MG tablet Place 1 tablet (40 mg total) into feeding tube daily at 6 PM. 02/20/13  Yes Robbie Lis, MD  bisacodyl (DULCOLAX) 10 MG suppository Place 1 suppository (10 mg total) rectally daily. 02/20/13  Yes Robbie Lis, MD  busPIRone (BUSPAR) 5 MG tablet Place 5 mg into feeding tube 3 (three) times daily.   Yes Historical Provider, MD  docusate (COLACE) 50 MG/5ML liquid Place 10 mLs (100 mg total) into feeding tube 2 (two) times daily. 02/20/13  Yes Robbie Lis, MD  Fluticasone-Salmeterol (ADVAIR) 250-50 MCG/DOSE AEPB Inhale 1 puff into the lungs 2 (two) times daily. 12/02/11  Yes Robbie Lis, MD  gabapentin (NEURONTIN) 300 MG capsule Place 2 capsules (600 mg total) into feeding tube 3 (three) times daily. 02/20/13  Yes Robbie Lis, MD  insulin aspart (NOVOLOG) 100 UNIT/ML injection Inject 0-9 Units into the skin 3 (three) times daily before meals. 02/20/13  Yes Robbie Lis, MD  lisinopril (PRINIVIL,ZESTRIL) 10 MG tablet 10 mg. 1 tablet (10 mg total) by Per J Tube route daily. 03/29/13  Yes Historical Provider, MD  LORazepam (ATIVAN) 2 MG/ML injection Inject 0.25 mLs (0.5 mg total) into the vein every 6 (six) hours as needed for anxiety. 02/20/13  Yes Robbie Lis, MD  metoCLOPramide (REGLAN) 5 MG/ML injection Inject 2 mLs (10 mg total) into the vein 4 (four) times daily.  02/20/13  Yes Robbie Lis, MD  metoprolol (LOPRESSOR) 1 MG/ML injection Inject 5 mLs (5 mg total) into the vein every 8 (eight) hours as needed (Please use for systolic blood pressure above 130; hold if HR less than 60). 02/20/13  Yes Robbie Lis, MD  Multiple Vitamin (MULTIVITAMIN WITH MINERALS) TABS Place 1 tablet into feeding tube daily. 02/20/13  Yes Robbie Lis, MD  Nutritional Supplements (FEEDING SUPPLEMENT, OSMOLITE 1.2 CAL,) LIQD Place 1,000 mLs into feeding tube continuous. Rate:25ml/hr   Yes Historical Provider, MD  oxyCODONE (OXY IR/ROXICODONE) 5 MG immediate release tablet Place 1-2  tablets (5-10 mg total) into feeding tube every 6 (six) hours as needed. 02/20/13  Yes Robbie Lis, MD  pantoprazole (PROTONIX) 40 MG injection Inject 40 mg into the vein every 12 (twelve) hours. 02/20/13  Yes Robbie Lis, MD  potassium chloride (KLOR-CON) 8 MEQ tablet 8 mEq. Take 1 tablet (8 mEq total) by mouth daily. 03/29/13 03/29/14 Yes Historical Provider, MD  promethazine (PHENERGAN) 25 MG/ML injection Inject 1 mL (25 mg total) into the vein every 6 (six) hours as needed for nausea. 02/20/13  Yes Robbie Lis, MD  sucralfate (CARAFATE) 1 GM/10ML suspension Place 10 mLs (1 g total) into feeding tube 4 (four) times daily -  with meals and at bedtime. 02/20/13  Yes Robbie Lis, MD  torsemide (DEMADEX) 10 MG tablet Take 10 mg by mouth daily as needed (swelling).   Yes Historical Provider, MD  zolpidem (AMBIEN) 10 MG tablet Take 1 tablet (10 mg total) by mouth at bedtime as needed for sleep. 02/20/13  Yes Robbie Lis, MD  HYDROmorphone (DILAUDID) 2 MG/ML SOLN injection Inject 1-1.5 mLs (2-3 mg total) into the vein every 2 (two) hours as needed. 02/20/13   Robbie Lis, MD    Allergies:   Allergies  Allergen Reactions  . Penicillins Hives  . Cefadroxil     Not sure of effects-listed as allergy on list from home  . Compazine Hives    Tolerates promethazine  . Darvocet [Propoxyphene-Acetaminophen] Other (See Comments)    UNKNOWN  . Nsaids Hives  . Zofran [Ondansetron] Hives    Social History:  reports that she has quit smoking. Her smoking use included Cigarettes. She has a .5 pack-year smoking history. She has never used smokeless tobacco. She reports that she does not drink alcohol or use illicit drugs.  Family History: Family History  Problem Relation Age of Onset  . Cancer Mother     Physical Exam: Filed Vitals:   04/10/13 2253 04/10/13 2306 04/11/13 0142 04/11/13 0330  BP:  181/114 171/101   Pulse:  122 103 104  Temp:  98.3 F (36.8 C)    TempSrc:  Oral    Resp:  20 16  18   SpO2: 98% 98% 96% 99%   General appearance: alert, cooperative and mild distress Head: Normocephalic, without obvious abnormality, atraumatic Eyes: negative Nose: Nares normal. Septum midline. Mucosa normal. No drainage or sinus tenderness. Neck: no JVD and supple, symmetrical, trachea midline Lungs: clear to auscultation bilaterally Heart: regular rate and rhythm, S1, S2 normal, no murmur, click, rub or gallop Abdomen: diffuse ttp, soft, nd pos bs no r/g nonacute abd Extremities: extremities normal, atraumatic, no cyanosis or edema Pulses: 2+ and symmetric Skin: Skin color, texture, turgor normal. No rashes or lesions Neurologic: Grossly normal    Labs on Admission:   Recent Labs  04/11/13 0140  NA 127*  K 2.7*  CL  83*  CO2 28  GLUCOSE 388*  BUN 9  CREATININE 0.46*  CALCIUM 9.4    Recent Labs  04/11/13 0140  AST 17  ALT 15  ALKPHOS 131*  BILITOT 0.3  PROT 7.7  ALBUMIN 3.2*    Recent Labs  04/11/13 0140  LIPASE 37    Recent Labs  04/11/13 0140  WBC 10.6*  NEUTROABS 8.6*  HGB 13.0  HCT 37.4  MCV 84.6  PLT 172   Radiological Exams on Admission: Dg Abd Acute W/chest  04/11/2013   *RADIOLOGY REPORT*  Clinical Data: Upper abdominal pain and vomiting for 5 days.  ACUTE ABDOMEN SERIES (ABDOMEN 2 VIEW & CHEST 1 VIEW)  Comparison: 03/31/2013  Findings: Normal heart size and pulmonary vascularity.  No focal airspace consolidation in the lungs.  Mild peribronchial thickening and central interstitial changes likely representing chronic bronchitis.  No blunting of costophrenic angles.  No pneumothorax. There is a gastrostomy tube which extends up into the mid esophagus in the loops down work with tip in the left upper quadrant.  The course of the tube is stable since previous study.  Gas and stool in the colon.  No small or large bowel distension. No free intra-abdominal air.  No abnormal air fluid levels. Surgical clips in the right upper quadrant with bowel  staples in the mid abdomen bilaterally.  Postoperative changes in the lower lumbar spine.  No radiopaque stones.  Calcified phleboliths in the pelvis.  IMPRESSION: No evidence of active pulmonary disease.  Chronic bronchitic changes.  Nonobstructive bowel gas pattern.  Malpositioned gastrostomy tube is stable since previous study.   Original Report Authenticated By: Lucienne Capers, M.D.   Assessment/Plan  42 yo female with n/v/abd pain probable gastroparesis uncontrolled Principal Problem:   Intractable nausea and vomiting Active Problems:   Gastroparesis   Hypertension   DM (diabetes mellitus), type 2, uncontrolled   Hypokalemia   Hyponatremia   Abdominal pain   Attention to gastrostomy tube  Her g tube is malpositioned.  Will need to call IR in am to reposition.  Will need PICC line in am also as is very hard iv stick and cannot get access right now after multiple attempts.  im reglan/phenergan/dilaudid.  vss at this time.  Ssi.  Tele.  Full code.  Cheyna Retana A 04/11/2013, 3:41 AM

## 2013-04-11 NOTE — ED Notes (Signed)
Iv team at bedside  

## 2013-04-11 NOTE — Procedures (Signed)
Successful placement of dual lumen PICC line to right brachial vein. Length 43cm Tip at lower SVC/RA No complications Ready for use.  Ascencion Dike PA-C Interventional Radiology 04/11/2013 1:42 PM

## 2013-04-11 NOTE — ED Notes (Signed)
unsuccessful with iv insertion iv team called

## 2013-04-11 NOTE — Procedures (Signed)
GJ tube repositioned from esophagus to stomach. Access into intestine was not possible.

## 2013-04-11 NOTE — ED Notes (Signed)
Blood drawn but still waiting for iv team

## 2013-04-12 LAB — BASIC METABOLIC PANEL
BUN: 22 mg/dL (ref 6–23)
Creatinine, Ser: 1.46 mg/dL — ABNORMAL HIGH (ref 0.50–1.10)
GFR calc Af Amer: 50 mL/min — ABNORMAL LOW (ref >90)
GFR calc non Af Amer: 43 mL/min — ABNORMAL LOW (ref >90)
Potassium: 3.6 mEq/L (ref 3.5–5.1)

## 2013-04-12 LAB — GLUCOSE, CAPILLARY
Glucose-Capillary: 113 mg/dL — ABNORMAL HIGH (ref 70–99)
Glucose-Capillary: 123 mg/dL — ABNORMAL HIGH (ref 70–99)

## 2013-04-12 LAB — CBC
Hemoglobin: 11.9 g/dL — ABNORMAL LOW (ref 12.0–15.0)
MCHC: 33.4 g/dL (ref 30.0–36.0)
Platelets: 191 10*3/uL (ref 150–400)
RDW: 14.2 % (ref 11.5–15.5)

## 2013-04-12 MED ORDER — CHLORHEXIDINE GLUCONATE 0.12 % MT SOLN
15.0000 mL | Freq: Two times a day (BID) | OROMUCOSAL | Status: DC
  Administered 2013-04-12 – 2013-04-16 (×9): 15 mL via OROMUCOSAL
  Filled 2013-04-12 (×12): qty 15

## 2013-04-12 MED ORDER — OSMOLITE 1.2 CAL PO LIQD: 1000.0000 mL | ORAL | Status: DC

## 2013-04-12 MED ORDER — SODIUM CHLORIDE 0.9 % IJ SOLN
10.0000 mL | INTRAMUSCULAR | Status: DC | PRN
  Administered 2013-04-12 – 2013-04-15 (×6): 10 mL

## 2013-04-12 MED ORDER — HYDROMORPHONE HCL PF 1 MG/ML IJ SOLN
1.0000 mg | Freq: Once | INTRAMUSCULAR | Status: AC
  Administered 2013-04-12: 21:00:00 1 mg via INTRAVENOUS
  Filled 2013-04-12: qty 1

## 2013-04-12 MED ORDER — OSMOLITE 1.2 CAL PO LIQD
1000.0000 mL | ORAL | Status: DC
  Administered 2013-04-12 – 2013-04-14 (×2): 1000 mL
  Filled 2013-04-12 (×7): qty 1000

## 2013-04-12 NOTE — Progress Notes (Signed)
Patient was recently picked up by [facility] [employer] services on behalf of [facility] program. However, when patient was called at home by [facility] Coordinator patient declined further services by [facility] [employer]. This writer went to visit patient at bedside yesterday to discuss [facility] [employer] again. However, she did not open her eyes to speak nor did she say a word. Left brochure at bedside again for her to call in the future if she should change her mind. [name], MSN- Ed, [facility] [phone]

## 2013-04-12 NOTE — Progress Notes (Signed)
[facility] PROGRESS NOTE  [name] [id number] DOB: 06-24-1971 DOA: 04/10/2013 PCP: [name], MD  Assessment/Plan: Nausea/Vomiting -2/2 severe gastroparesis plus coiling of G tube in esophagus. -Patient feels better today and is requesting her tube feeds be restarted.  Hypokalemia -Repleted.  DM -Well controlled.  Gastroparesis -2/2 DM.  Code Status: Full code Family Communication: Patient only  Disposition Plan: Home when medically stable; anticipate 24-48 hours.   Consultants:  None   Antibiotics:  None   Subjective: N/v improved today.  Objective: Filed Vitals:   04/11/13 1736 04/11/13 1958 04/12/13 0414 04/12/13 1328  BP:  126/76 105/74 127/84  Pulse:  114 106 95  Temp: 99.6 F (37.6 C) 99.2 F (37.3 C) 98.6 F (37 C) 98.4 F (36.9 C)  TempSrc:  Oral Oral Oral  Resp:  20 16 20   Height:      Weight:      SpO2:  97% 98% 100%    Intake/Output Summary (Last 24 hours) at 04/12/13 1641 Last data filed at 04/12/13 1414  Gross per 24 hour  Intake 1496.25 ml  Output    600 ml  Net 896.25 ml   Filed Weights   04/11/13 0449  Weight: 61.3 kg (135 lb 2.3 oz)    Exam:   General:  AA Ox3  Cardiovascular: RRR  Respiratory: CTA B  Abdomen: S/NT/+BS/G tube present  Extremities: no C/C/E   Neurologic:  Non-focal.  Data Reviewed: Basic Metabolic Panel:  Recent Labs Lab 04/06/13 1400 04/11/13 0140 04/11/13 1840 04/12/13 0505  NA 123* 127*  --  133*  K 3.1* 2.7*  --  3.6  CL 80* 83*  --  94*  CO2 29 28  --  30  GLUCOSE 514* 388*  --  147*  BUN 16 9  --  22  CREATININE 0.51 0.46*  --  1.46*  CALCIUM 9.7 9.4  --  8.8  MG  --   --  1.5  --    Liver Function Tests:  Recent Labs Lab 04/06/13 1400 04/11/13 0140  AST 15 17  ALT 27 15  ALKPHOS 155* 131*  BILITOT 0.3 0.3  PROT 7.9 7.7  ALBUMIN 3.0* 3.2*    Recent Labs Lab 04/06/13 1400 04/11/13 0140  LIPASE 32 37   No results found for this basename: AMMONIA,   in the last 168 hours CBC:  Recent Labs Lab 04/06/13 1400 04/11/13 0140 04/12/13 0505  WBC 11.5* 10.6* 8.9  NEUTROABS 9.5* 8.6*  --   HGB 14.6 13.0 11.9*  HCT 41.0 37.4 35.6*  MCV 83.8 84.6 87.0  PLT 189 172 191   Cardiac Enzymes: No results found for this basename: CKTOTAL, CKMB, CKMBINDEX, TROPONINI,  in the last 168 hours BNP (last 3 results) No results found for this basename: PROBNP,  in the last 8760 hours CBG:  Recent Labs Lab 04/12/13 0028 04/12/13 0412 04/12/13 0730 04/12/13 1134 04/12/13 1554  GLUCAP 140* 132* 133* 121* 104*    No results found for this or any previous visit (from the past 240 hour(s)).   Studies: Ir Fluoro Guide Cv Line Right  04/11/2013   *RADIOLOGY REPORT*  Clinical Data: Poor venous access.  Powered PICC LINE PLACEMENT WITH ULTRASOUND AND FLUOROSCOPIC GUIDANCE  Fluoroscopy Time: 0 minutes, 24 seconds  The right arm was prepped with chlorhexidine, draped in the usual sterile fashion using maximum barrier technique (cap and mask, sterile gown, sterile gloves, large sterile sheet, hand hygiene and cutaneous antisepsis) and infiltrated locally with  1% Lidocaine.  Ultrasound demonstrated patency of the right brachial vein, and this was documented with an image.  Under real-time ultrasound guidance, this vein was accessed with a 21 gauge micropuncture needle and image documentation was performed.  The needle was exchanged over a guidewire for a peel-away sheath through which a five [place] dual lumen PICC trimmed to 43 cm was advanced, positioned with its tip at the lower SVC/right atrial junction. Fluoroscopy during the procedure and fluoro spot radiograph confirms appropriate catheter position.  The catheter was flushed, secured to the skin with Prolene sutures, and covered with a sterile dressing.  Complications:  None  IMPRESSION: Successful right arm poweredPICC line placement with ultrasound and fluoroscopic guidance.  The catheter is ready for use.   Read by: Ascencion Dike, P.A,-C   Original Report Authenticated By: Marybelle Killings, M.D.   Ir Replc Duoden/jejuno Tube Percut W/fluoro  04/11/2013   *RADIOLOGY REPORT*  Clinical Data: Gastrojejunostomy tube looped in the esophagus.  IR REPLACE DUODEN/JEJUNO TUBE PERCUT WITH FLOURO  Procedure:  Initial imaging demonstrates that the gastrojejunal tube is looped in the esophagus.  It was exchanged over a glide wire for a Kumpe catheter.  Access into the duodenum was not possible.  The patient continue to vomit during the examination. The gastrojejunostomy tube was then reinserted over the glide wire and looped in the stomach.  5 ml saline was utilized to inflate the balloon.  It was secured in place.  Contrast was injected.  Findings: The tip of the gastrojejunostomy tube was pulled from the esophagus and is now looped in the stomach.  IMPRESSION: Gastrojejunostomy tube was looped in the stomach.   Original Report Authenticated By: Marybelle Killings, M.D.   Ir US Guide Vasc Access Right  04/11/2013   *RADIOLOGY REPORT*  Clinical Data: Poor venous access.  Powered PICC LINE PLACEMENT WITH ULTRASOUND AND FLUOROSCOPIC GUIDANCE  Fluoroscopy Time: 0 minutes, 24 seconds  The right arm was prepped with chlorhexidine, draped in the usual sterile fashion using maximum barrier technique (cap and mask, sterile gown, sterile gloves, large sterile sheet, hand hygiene and cutaneous antisepsis) and infiltrated locally with 1% Lidocaine.  Ultrasound demonstrated patency of the right brachial vein, and this was documented with an image.  Under real-time ultrasound guidance, this vein was accessed with a 21 gauge micropuncture needle and image documentation was performed.  The needle was exchanged over a guidewire for a peel-away sheath through which a five [place] dual lumen PICC trimmed to 43 cm was advanced, positioned with its tip at the lower SVC/right atrial junction. Fluoroscopy during the procedure and fluoro spot radiograph  confirms appropriate catheter position.  The catheter was flushed, secured to the skin with Prolene sutures, and covered with a sterile dressing.  Complications:  None  IMPRESSION: Successful right arm poweredPICC line placement with ultrasound and fluoroscopic guidance.  The catheter is ready for use.  Read by: Ascencion Dike, P.A,-C   Original Report Authenticated By: Marybelle Killings, M.D.   Dg Abd Acute W/chest  04/11/2013   *RADIOLOGY REPORT*  Clinical Data: Upper abdominal pain and vomiting for 5 days.  ACUTE ABDOMEN SERIES (ABDOMEN 2 VIEW & CHEST 1 VIEW)  Comparison: 03/31/2013  Findings: Normal heart size and pulmonary vascularity.  No focal airspace consolidation in the lungs.  Mild peribronchial thickening and central interstitial changes likely representing chronic bronchitis.  No blunting of costophrenic angles.  No pneumothorax. There is a gastrostomy tube which extends up into the mid esophagus in the  loops down work with tip in the left upper quadrant.  The course of the tube is stable since previous study.  Gas and stool in the colon.  No small or large bowel distension. No free intra-abdominal air.  No abnormal air fluid levels. Surgical clips in the right upper quadrant with bowel staples in the mid abdomen bilaterally.  Postoperative changes in the lower lumbar spine.  No radiopaque stones.  Calcified phleboliths in the pelvis.  IMPRESSION: No evidence of active pulmonary disease.  Chronic bronchitic changes.  Nonobstructive bowel gas pattern.  Malpositioned gastrostomy tube is stable since previous study.   Original Report Authenticated By: Lucienne Capers, M.D.    Scheduled Meds: . chlorhexidine  15 mL Mouth Rinse BID  . cloNIDine  0.2 mg Transdermal Weekly  . enoxaparin (LOVENOX) injection  40 mg Subcutaneous Q24H  . insulin aspart  0-9 Units Subcutaneous Q4H  . metoCLOPramide (REGLAN) injection  10 mg Intravenous Q6H  . sodium chloride  3 mL Intravenous Q12H  . sodium chloride  3 mL  Intravenous Q12H   Continuous Infusions: . sodium chloride 75 mL/hr at 04/12/13 1149  . feeding supplement (OSMOLITE 1.2 CAL) Stopped (04/12/13 1000)    Principal Problem:   Intractable nausea and vomiting Active Problems:   Gastroparesis   Hypertension   DM (diabetes mellitus), type 2, uncontrolled   Hypokalemia   Hyponatremia   Abdominal pain   Attention to gastrostomy tube    Time spent: 35 minutes    [facility] Pager [phone]  If 7PM-7AM, please contact night-coverage at [email or web address], password [email or web address] 04/12/2013, 4:41 PM  LOS: 2 days

## 2013-04-12 NOTE — Progress Notes (Signed)
Tube feeding started at 1830 by daytime RN with instructions to increase feeding by 15 ml/hr q2 to desired dose of 60 ml/hr. At 2100, dose about to be adjusted to 30 ml/hr, but patient experiencing extreme discomfort and nausea she believes to be caused by her tube feed. Residuals checked to be 15 ml. MD on call paged and gave instructions not to increase tube feed past 15 ml/hr for now.1 time dose pain medicine ordered for discomfort. Will continue to monitor. Marita Snellen

## 2013-04-12 NOTE — Progress Notes (Signed)
Inpatient Diabetes Program Recommendations  AACE/ADA: New Consensus Statement on Inpatient Glycemic Control (2013)  Target Ranges:  Prepandial:   less than 140 mg/dL      Peak postprandial:   less than 180 mg/dL (1-2 hours)      Critically ill patients:  140 - 180 mg/dL   Glucose much improved this morning.  May need addition of tube feed coverage Novolog once at goal. Thank you  Raoul Pitch BSN, RN,CDE Inpatient Diabetes Coordinator [phone] (team pager)

## 2013-04-13 LAB — CBC
Hemoglobin: 10 g/dL — ABNORMAL LOW (ref 12.0–15.0)
MCHC: 33 g/dL (ref 30.0–36.0)
RDW: 14.1 % (ref 11.5–15.5)

## 2013-04-13 LAB — BASIC METABOLIC PANEL
BUN: 27 mg/dL — ABNORMAL HIGH (ref 6–23)
Creatinine, Ser: 1.02 mg/dL (ref 0.50–1.10)
GFR calc Af Amer: 77 mL/min — ABNORMAL LOW (ref >90)
GFR calc non Af Amer: 66 mL/min — ABNORMAL LOW (ref >90)
Potassium: 3.2 mEq/L — ABNORMAL LOW (ref 3.5–5.1)

## 2013-04-13 MED ORDER — SODIUM CHLORIDE 0.9 % IV SOLN
250.0000 mg | Freq: Four times a day (QID) | INTRAVENOUS | Status: DC
  Administered 2013-04-13 – 2013-04-16 (×13): 250 mg via INTRAVENOUS
  Filled 2013-04-13 (×17): qty 250

## 2013-04-13 MED ORDER — POTASSIUM CHLORIDE CRYS ER 20 MEQ PO TBCR
40.0000 meq | EXTENDED_RELEASE_TABLET | ORAL | Status: AC
  Administered 2013-04-13: 15:00:00 40 meq via ORAL
  Filled 2013-04-13 (×2): qty 2

## 2013-04-13 MED ORDER — SODIUM CHLORIDE 0.9 % IJ SOLN
10.0000 mL | Freq: Two times a day (BID) | INTRAMUSCULAR | Status: DC
  Administered 2013-04-13 – 2013-04-15 (×3): 10 mL via INTRAVENOUS

## 2013-04-13 NOTE — Progress Notes (Signed)
Patient continuing to have increased discomfort and nausea with the tube feeding at 15 ml/hr. Patient states, "I just can't take it anymore." MD on call paged and said to stop the tube feeding for now. Gastric residual checked to be 20 mL. Will continue to monitor.

## 2013-04-13 NOTE — Progress Notes (Addendum)
[facility] PROGRESS NOTE  AMIAYAH [name] [id number] DOB: 04-Jan-1971 DOA: 04/10/2013 PCP: [name], MD  Assessment/Plan: Nausea/Vomiting -2/2 severe gastroparesis plus coiling of G tube in esophagus. -Tube feeds had to be held last night 2/2 abdominal pain, no actual emesis. -She feels better today and would like to try clears. -Will also start erythromycin, to help with her gastroparesis.  Hypokalemia -Replete PO. -2/2 GI losses.   DM -Well controlled.  Gastroparesis -2/2 DM. -As above.  Code Status: Full code Family Communication: Patient only  Disposition Plan: Home when medically stable; anticipate 24-48 hours.   Consultants:  None   Antibiotics:  None   Subjective: N/v improved today.  Objective: Filed Vitals:   04/12/13 0414 04/12/13 1328 04/12/13 2039 04/13/13 0531  BP: 105/74 127/84 113/71 126/76  Pulse: 106 95 89 81  Temp: 98.6 F (37 C) 98.4 F (36.9 C) 98.5 F (36.9 C) 98.3 F (36.8 C)  TempSrc: Oral Oral Oral Oral  Resp: 16 20 18 18   Height:      Weight:      SpO2: 98% 100% 100% 100%    Intake/Output Summary (Last 24 hours) at 04/13/13 1211 Last data filed at 04/13/13 0900  Gross per 24 hour  Intake    976 ml  Output    550 ml  Net    426 ml   Filed Weights   04/11/13 0449  Weight: 61.3 kg (135 lb 2.3 oz)    Exam:   General:  AA Ox3  Cardiovascular: RRR  Respiratory: CTA B  Abdomen: S/NT/+BS/G tube present  Extremities: no C/C/E   Neurologic:  Non-focal.  Data Reviewed: Basic Metabolic Panel:  Recent Labs Lab 04/06/13 1400 04/11/13 0140 04/11/13 1840 04/12/13 0505 04/13/13 0545  NA 123* 127*  --  133* 130*  K 3.1* 2.7*  --  3.6 3.2*  CL 80* 83*  --  94* 95*  CO2 29 28  --  30 26  GLUCOSE 514* 388*  --  147* 81  BUN 16 9  --  22 27*  CREATININE 0.51 0.46*  --  1.46* 1.02  CALCIUM 9.7 9.4  --  8.8 8.6  MG  --   --  1.5  --   --    Liver Function Tests:  Recent Labs Lab 04/06/13 1400  04/11/13 0140  AST 15 17  ALT 27 15  ALKPHOS 155* 131*  BILITOT 0.3 0.3  PROT 7.9 7.7  ALBUMIN 3.0* 3.2*    Recent Labs Lab 04/06/13 1400 04/11/13 0140  LIPASE 32 37   No results found for this basename: AMMONIA,  in the last 168 hours CBC:  Recent Labs Lab 04/06/13 1400 04/11/13 0140 04/12/13 0505 04/13/13 0545  WBC 11.5* 10.6* 8.9 6.4  NEUTROABS 9.5* 8.6*  --   --   HGB 14.6 13.0 11.9* 10.0*  HCT 41.0 37.4 35.6* 30.3*  MCV 83.8 84.6 87.0 87.8  PLT 189 172 191 167   Cardiac Enzymes: No results found for this basename: CKTOTAL, CKMB, CKMBINDEX, TROPONINI,  in the last 168 hours BNP (last 3 results) No results found for this basename: PROBNP,  in the last 8760 hours CBG:  Recent Labs Lab 04/12/13 2038 04/12/13 2353 04/13/13 0358 04/13/13 0741 04/13/13 1145  GLUCAP 123* 113* 95 82 91    No results found for this or any previous visit (from the past 240 hour(s)).   Studies: Ir Fluoro Guide Cv Line Right  04/11/2013   *RADIOLOGY REPORT*  Clinical Data:  Poor venous access.  Powered PICC LINE PLACEMENT WITH ULTRASOUND AND FLUOROSCOPIC GUIDANCE  Fluoroscopy Time: 0 minutes, 24 seconds  The right arm was prepped with chlorhexidine, draped in the usual sterile fashion using maximum barrier technique (cap and mask, sterile gown, sterile gloves, large sterile sheet, hand hygiene and cutaneous antisepsis) and infiltrated locally with 1% Lidocaine.  Ultrasound demonstrated patency of the right brachial vein, and this was documented with an image.  Under real-time ultrasound guidance, this vein was accessed with a 21 gauge micropuncture needle and image documentation was performed.  The needle was exchanged over a guidewire for a peel-away sheath through which a five [place] dual lumen PICC trimmed to 43 cm was advanced, positioned with its tip at the lower SVC/right atrial junction. Fluoroscopy during the procedure and fluoro spot radiograph confirms appropriate catheter  position.  The catheter was flushed, secured to the skin with Prolene sutures, and covered with a sterile dressing.  Complications:  None  IMPRESSION: Successful right arm poweredPICC line placement with ultrasound and fluoroscopic guidance.  The catheter is ready for use.  Read by: Ascencion Dike, P.A,-C   Original Report Authenticated By: Marybelle Killings, M.D.   Ir Replc Duoden/jejuno Tube Percut W/fluoro  04/11/2013   *RADIOLOGY REPORT*  Clinical Data: Gastrojejunostomy tube looped in the esophagus.  IR REPLACE DUODEN/JEJUNO TUBE PERCUT WITH FLOURO  Procedure:  Initial imaging demonstrates that the gastrojejunal tube is looped in the esophagus.  It was exchanged over a glide wire for a Kumpe catheter.  Access into the duodenum was not possible.  The patient continue to vomit during the examination. The gastrojejunostomy tube was then reinserted over the glide wire and looped in the stomach.  5 ml saline was utilized to inflate the balloon.  It was secured in place.  Contrast was injected.  Findings: The tip of the gastrojejunostomy tube was pulled from the esophagus and is now looped in the stomach.  IMPRESSION: Gastrojejunostomy tube was looped in the stomach.   Original Report Authenticated By: Marybelle Killings, M.D.   Ir US Guide Vasc Access Right  04/11/2013   *RADIOLOGY REPORT*  Clinical Data: Poor venous access.  Powered PICC LINE PLACEMENT WITH ULTRASOUND AND FLUOROSCOPIC GUIDANCE  Fluoroscopy Time: 0 minutes, 24 seconds  The right arm was prepped with chlorhexidine, draped in the usual sterile fashion using maximum barrier technique (cap and mask, sterile gown, sterile gloves, large sterile sheet, hand hygiene and cutaneous antisepsis) and infiltrated locally with 1% Lidocaine.  Ultrasound demonstrated patency of the right brachial vein, and this was documented with an image.  Under real-time ultrasound guidance, this vein was accessed with a 21 gauge micropuncture needle and image documentation was  performed.  The needle was exchanged over a guidewire for a peel-away sheath through which a five [place] dual lumen PICC trimmed to 43 cm was advanced, positioned with its tip at the lower SVC/right atrial junction. Fluoroscopy during the procedure and fluoro spot radiograph confirms appropriate catheter position.  The catheter was flushed, secured to the skin with Prolene sutures, and covered with a sterile dressing.  Complications:  None  IMPRESSION: Successful right arm poweredPICC line placement with ultrasound and fluoroscopic guidance.  The catheter is ready for use.  Read by: Ascencion Dike, P.A,-C   Original Report Authenticated By: Marybelle Killings, M.D.    Scheduled Meds: . chlorhexidine  15 mL Mouth Rinse BID  . cloNIDine  0.2 mg Transdermal Weekly  . enoxaparin (LOVENOX) injection  40 mg Subcutaneous Q24H  .  erythromycin  250 mg Intravenous Q6H  . insulin aspart  0-9 Units Subcutaneous Q4H  . metoCLOPramide (REGLAN) injection  10 mg Intravenous Q6H  . sodium chloride  3 mL Intravenous Q12H  . sodium chloride  3 mL Intravenous Q12H   Continuous Infusions: . sodium chloride 75 mL/hr at 04/13/13 0048  . feeding supplement (OSMOLITE 1.2 CAL) Stopped (04/13/13 0011)    Principal Problem:   Intractable nausea and vomiting Active Problems:   Gastroparesis   Hypertension   DM (diabetes mellitus), type 2, uncontrolled   Hypokalemia   Hyponatremia   Abdominal pain   Attention to gastrostomy tube    Time spent: 35 minutes    [facility] Pager [phone]  If 7PM-7AM, please contact night-coverage at [email or web address], password [facility] 04/13/2013, 12:11 PM  LOS: 3 days

## 2013-04-14 LAB — GLUCOSE, CAPILLARY
Glucose-Capillary: 103 mg/dL — ABNORMAL HIGH (ref 70–99)
Glucose-Capillary: 111 mg/dL — ABNORMAL HIGH (ref 70–99)
Glucose-Capillary: 139 mg/dL — ABNORMAL HIGH (ref 70–99)

## 2013-04-14 MED ORDER — HYDROMORPHONE HCL PF 1 MG/ML IJ SOLN
1.0000 mg | Freq: Once | INTRAMUSCULAR | Status: AC
  Administered 2013-04-14: 21:00:00 1 mg via INTRAVENOUS
  Filled 2013-04-14: qty 1

## 2013-04-14 NOTE — Progress Notes (Signed)
[facility] PROGRESS NOTE  [name] [id number] DOB: [birth year] DOA: 04/10/2013 PCP: [name], MD  Assessment/Plan: Nausea/Vomiting -2/2 severe gastroparesis plus coiling of G tube in esophagus. -Tube feeds had to be held last night 2/2 abdominal pain, no actual emesis. -Still has some nausea/dry heaving and some abdominal pain that she recognizes as being chronic from her gastroparesis. -Is willing to start her tube feeds today.  Hypokalemia -Replete PO. -2/2 GI losses.   DM -Well controlled.  Gastroparesis -2/2 DM. -As above.  Code Status: Full code Family Communication: Patient only  Disposition Plan: Home when medically stable; anticipate 24-48 hours.   Consultants:  None   Antibiotics:  None   Subjective: Still with nausea, but no actual emesis today.  Objective: Filed Vitals:   04/13/13 0531 04/13/13 1413 04/13/13 2124 04/14/13 0430  BP: 126/76 137/78 132/82 171/92  Pulse: 81 86 85 85  Temp: 98.3 F (36.8 C) 98.8 F (37.1 C) 98.4 F (36.9 C) 98.1 F (36.7 C)  TempSrc: Oral Oral Oral Oral  Resp: 18 20 19 18   Height:      Weight:      SpO2: 100% 100% 100% 100%    Intake/Output Summary (Last 24 hours) at 04/14/13 1323 Last data filed at 04/14/13 [id number]  Gross per 24 hour  Intake 2697.5 ml  Output    800 ml  Net 1897.5 ml   Filed Weights   04/11/13 0449  Weight: 61.3 kg (135 lb 2.3 oz)    Exam:   General:  AA Ox3  Cardiovascular: RRR  Respiratory: CTA B  Abdomen: S/NT/+BS/G tube present  Extremities: no C/C/E   Neurologic:  Non-focal.  Data Reviewed: Basic Metabolic Panel:  Recent Labs Lab 04/11/13 0140 04/11/13 1840 04/12/13 0505 04/13/13 0545  NA 127*  --  133* 130*  K 2.7*  --  3.6 3.2*  CL 83*  --  94* 95*  CO2 28  --  30 26  GLUCOSE 388*  --  147* 81  BUN 9  --  22 27*  CREATININE 0.46*  --  1.46* 1.02  CALCIUM 9.4  --  8.8 8.6  MG  --  1.5  --   --    Liver Function Tests:  Recent Labs Lab  04/11/13 0140  AST 17  ALT 15  ALKPHOS 131*  BILITOT 0.3  PROT 7.7  ALBUMIN 3.2*    Recent Labs Lab 04/11/13 0140  LIPASE 37   No results found for this basename: AMMONIA,  in the last 168 hours CBC:  Recent Labs Lab 04/11/13 0140 04/12/13 0505 04/13/13 0545  WBC 10.6* 8.9 6.4  NEUTROABS 8.6*  --   --   HGB 13.0 11.9* 10.0*  HCT 37.4 35.6* 30.3*  MCV 84.6 87.0 87.8  PLT 172 191 167   Cardiac Enzymes: No results found for this basename: CKTOTAL, CKMB, CKMBINDEX, TROPONINI,  in the last 168 hours BNP (last 3 results) No results found for this basename: PROBNP,  in the last 8760 hours CBG:  Recent Labs Lab 04/13/13 2127 04/14/13 0010 04/14/13 0428 04/14/13 0746 04/14/13 1157  GLUCAP 89 108* 103* 111* 139*    No results found for this or any previous visit (from the past 240 hour(s)).   Studies: No results found.  Scheduled Meds: . chlorhexidine  15 mL Mouth Rinse BID  . cloNIDine  0.2 mg Transdermal Weekly  . enoxaparin (LOVENOX) injection  40 mg Subcutaneous Q24H  . erythromycin  250 mg Intravenous Q6H  .  insulin aspart  0-9 Units Subcutaneous Q4H  . metoCLOPramide (REGLAN) injection  10 mg Intravenous Q6H  . sodium chloride  10 mL Intravenous Q12H   Continuous Infusions: . sodium chloride 75 mL/hr at 04/13/13 0048  . feeding supplement (OSMOLITE 1.2 CAL) Stopped (04/13/13 0011)    Principal Problem:   Intractable nausea and vomiting Active Problems:   Gastroparesis   Hypertension   DM (diabetes mellitus), type 2, uncontrolled   Hypokalemia   Hyponatremia   Abdominal pain   Attention to gastrostomy tube    Time spent: 35 minutes    [place] [facility] Pager [phone]  If 7PM-7AM, please contact night-coverage at [email or web address], password [facility] 04/14/2013, 1:23 PM  LOS: 4 days

## 2013-04-15 LAB — GLUCOSE, CAPILLARY
Glucose-Capillary: 130 mg/dL — ABNORMAL HIGH (ref 70–99)
Glucose-Capillary: 106 mg/dL — ABNORMAL HIGH (ref 70–99)
Glucose-Capillary: 210 mg/dL — ABNORMAL HIGH (ref 70–99)

## 2013-04-15 MED ORDER — OXYCODONE HCL 5 MG PO TABS
5.0000 mg | ORAL_TABLET | ORAL | Status: DC | PRN
  Administered 2013-04-15 – 2013-04-16 (×5): 5 mg via ORAL
  Filled 2013-04-15 (×6): qty 1

## 2013-04-15 MED ORDER — HYDROMORPHONE HCL PF 1 MG/ML IJ SOLN
1.0000 mg | Freq: Once | INTRAMUSCULAR | Status: AC
  Administered 2013-04-15: 10:00:00 1 mg via INTRAVENOUS

## 2013-04-15 NOTE — Progress Notes (Signed)
[facility] PROGRESS NOTE  [name] [id number] DOB: Jan 29, 1971 DOA: 04/10/2013 PCP: [name], MD  Assessment/Plan: Nausea/Vomiting -2/2 severe gastroparesis plus coiling of G tube in esophagus. -Still present, but states it is nearing her baseline. -Still has some nausea/dry heaving and some abdominal pain that she recognizes as being chronic from her gastroparesis. -Will start up-titrating her tube feeds today.  Hypokalemia -Replete PO. -2/2 GI losses.  -Recheck BMET in am.  DM -Well controlled.  Gastroparesis -2/2 DM. -As above.  Code Status: Full code Family Communication: Patient only  Disposition Plan: Home when medically stable; anticipate 24-48 hours.   Consultants:  None   Antibiotics:  None   Subjective: Still with nausea, but no actual emesis today.  Objective: Filed Vitals:   04/14/13 1414 04/14/13 1819 04/14/13 2001 04/15/13 0548  BP: 188/105 157/84 147/82 155/87  Pulse: 82 86 82 79  Temp: 98 F (36.7 C)  98.4 F (36.9 C) 97.9 F (36.6 C)  TempSrc: Oral  Oral Oral  Resp: 20  18   Height:      Weight:      SpO2: 100%  100% 100%    Intake/Output Summary (Last 24 hours) at 04/15/13 1032 Last data filed at 04/15/13 0900  Gross per 24 hour  Intake   3026 ml  Output   2350 ml  Net    676 ml   Filed Weights   04/11/13 0449  Weight: 61.3 kg (135 lb 2.3 oz)    Exam:   General:  AA Ox3  Cardiovascular: RRR  Respiratory: CTA B  Abdomen: S/NT/+BS/G tube present  Extremities: no C/C/E   Neurologic:  Non-focal.  Data Reviewed: Basic Metabolic Panel:  Recent Labs Lab 04/11/13 0140 04/11/13 1840 04/12/13 0505 04/13/13 0545  NA 127*  --  133* 130*  K 2.7*  --  3.6 3.2*  CL 83*  --  94* 95*  CO2 28  --  30 26  GLUCOSE 388*  --  147* 81  BUN 9  --  22 27*  CREATININE 0.46*  --  1.46* 1.02  CALCIUM 9.4  --  8.8 8.6  MG  --  1.5  --   --    Liver Function Tests:  Recent Labs Lab 04/11/13 0140  AST 17   ALT 15  ALKPHOS 131*  BILITOT 0.3  PROT 7.7  ALBUMIN 3.2*    Recent Labs Lab 04/11/13 0140  LIPASE 37   No results found for this basename: AMMONIA,  in the last 168 hours CBC:  Recent Labs Lab 04/11/13 0140 04/12/13 0505 04/13/13 0545  WBC 10.6* 8.9 6.4  NEUTROABS 8.6*  --   --   HGB 13.0 11.9* 10.0*  HCT 37.4 35.6* 30.3*  MCV 84.6 87.0 87.8  PLT 172 191 167   Cardiac Enzymes: No results found for this basename: CKTOTAL, CKMB, CKMBINDEX, TROPONINI,  in the last 168 hours BNP (last 3 results) No results found for this basename: PROBNP,  in the last 8760 hours CBG:  Recent Labs Lab 04/14/13 1658 04/14/13 1957 04/14/13 2329 04/15/13 04/15/13 0357  GLUCAP 84 128* 88 111* 130*    No results found for this or any previous visit (from the past 240 hour(s)).   Studies: No results found.  Scheduled Meds: . chlorhexidine  15 mL Mouth Rinse BID  . cloNIDine  0.2 mg Transdermal Weekly  . enoxaparin (LOVENOX) injection  40 mg Subcutaneous Q24H  . erythromycin  250 mg Intravenous Q6H  . insulin  aspart  0-9 Units Subcutaneous Q4H  . metoCLOPramide (REGLAN) injection  10 mg Intravenous Q6H  . sodium chloride  10 mL Intravenous Q12H   Continuous Infusions: . sodium chloride 75 mL/hr at 04/15/13 0205  . feeding supplement (OSMOLITE 1.2 CAL) 1,000 mL (04/14/13 1800)    Principal Problem:   Intractable nausea and vomiting Active Problems:   Gastroparesis   Hypertension   DM (diabetes mellitus), type 2, uncontrolled   Hypokalemia   Hyponatremia   Abdominal pain   Attention to gastrostomy tube    Time spent: 35 minutes    [facility] Pager [phone]  If 7PM-7AM, please contact night-coverage at [email or web address], password [facility] 04/15/2013, 10:32 AM  LOS: 5 days

## 2013-04-15 NOTE — Progress Notes (Signed)
[place], RN, BSN, CCM [phone] Chart Reviewed for discharge and hospital needs. Discharge needs at time of review:  None Review of patient progress due on [id number].

## 2013-04-15 NOTE — Progress Notes (Addendum)
NUTRITION FOLLOW-UP  DOCUMENTATION CODES Per approved criteria  -Not Applicable   INTERVENTION: Recommend increasing Osmolite 1.2 by 10 ml every 4 hours, or as tolerated, to goal rate of 65 ml/hr. At goal rate, tube feeding regimen will provide 1872 kcal, 86 grams of protein, and 1279 ml of H2O.  Recommend 100 ml free water flushes every 4 hours once IVF discontinued. RD to continue to follow nutrition care plan.  NUTRITION DIAGNOSIS: Inadequate oral intake now related to GI distress as evidenced by need for slow diet advancement and pt report.  Goal: Pt to meet >/= 90% of their estimated nutrition needs. Unmet.  Monitor:  TF tolerance, Weight, Labs  ASSESSMENT: 42 yo female h/o gastroparesis, dm, s/p gtube placement last month, seizure d/o, copd, chronic pain syndrome comes in with n/v and abd pain. Has not been able to keep any of her meds down. This is similar to her previous gastroparesis flares. Denies any fevers. Denies any blood in vomit. No diarrhea.  PICC placed 9/11. Underwent G-tube repositioning on 9/11. Per chart review, pt has had ongoing issues with tolerance of nutrition support - requesting tube feeding to be turned off for severe nausea. Currently receiving Osmolite 1.2 at 15 ml/hr per RN. Getting reglan. RN denies emesis. Pt asking for pain meds and nausea meds around the clock. Discussed advancement of rate with RN. RN reports that she is going to call the MD soon to discuss.  Remains on Clear Liquids. Taking small amounts, such as jello.  Per chart pt was receiving Osmolite 1.2 @ 60 ml/hr which provides 1728 kcal, 80 grams of protein, and 1180 ml of H2O.   Potassium is low at 3.2. Blood sugars ranging from 88 - 130.  Height: Ht Readings from Last 1 Encounters:  04/11/13 5\' 4"  (1.626 m)    Weight: Wt Readings from Last 1 Encounters:  04/11/13 135 lb 2.3 oz (61.3 kg)  No new weight.  BMI:  Body mass index is 23.19 kg/(m^2). WNL  Estimated Nutritional  Needs: Kcal: 1700-1900 Protein: 90-100 grams Fluid: 1.9-2.1 L/day  Skin: intact  Diet Order: Clear Liquid  EDUCATION NEEDS: -No education needs identified at this time   Intake/Output Summary (Last 24 hours) at 04/15/13 1153 Last data filed at 04/15/13 0900  Gross per 24 hour  Intake   3026 ml  Output   2350 ml  Net    676 ml    Last BM: 9/13  Labs:   Recent Labs Lab 04/11/13 0140 04/11/13 1840 04/12/13 0505 04/13/13 0545  NA 127*  --  133* 130*  K 2.7*  --  3.6 3.2*  CL 83*  --  94* 95*  CO2 28  --  30 26  BUN 9  --  22 27*  CREATININE 0.46*  --  1.46* 1.02  CALCIUM 9.4  --  8.8 8.6  MG  --  1.5  --   --   GLUCOSE 388*  --  147* 81    CBG (last 3)   Recent Labs  04/14/13 2329 04/15/13 04/15/13 0357  GLUCAP 88 111* 130*    Scheduled Meds: . chlorhexidine  15 mL Mouth Rinse BID  . cloNIDine  0.2 mg Transdermal Weekly  . enoxaparin (LOVENOX) injection  40 mg Subcutaneous Q24H  . erythromycin  250 mg Intravenous Q6H  . insulin aspart  0-9 Units Subcutaneous Q4H  . metoCLOPramide (REGLAN) injection  10 mg Intravenous Q6H  . sodium chloride  10 mL Intravenous Q12H  Continuous Infusions: . sodium chloride 75 mL/hr at 04/15/13 0205  . feeding supplement (OSMOLITE 1.2 CAL) 1,000 mL (04/14/13 1800)   Inda Coke MS, RD, LDN Pager: [phone] After-hours pager: [phone]

## 2013-04-15 NOTE — Progress Notes (Signed)
Patient is having a hard time tolerating the tube feeding. She's always complaining of nausea and constant abdominal pain. She requested two times to turn the feeding off for an hour for severe nausea. No residual noted but unable to increase the rate at this point. Will continue to monitor patient.

## 2013-04-16 LAB — BASIC METABOLIC PANEL
BUN: 8 mg/dL (ref 6–23)
Calcium: 8.6 mg/dL (ref 8.4–10.5)
Creatinine, Ser: 0.6 mg/dL (ref 0.50–1.10)
GFR calc non Af Amer: >90 mL/min (ref >90)
Glucose, Bld: 229 mg/dL — ABNORMAL HIGH (ref 70–99)

## 2013-04-16 LAB — GLUCOSE, CAPILLARY
Glucose-Capillary: 275 mg/dL — ABNORMAL HIGH (ref 70–99)
Glucose-Capillary: 154 mg/dL — ABNORMAL HIGH (ref 70–99)

## 2013-04-16 MED ORDER — PROMETHAZINE HCL 12.5 MG RE SUPP: 12.5000 mg | Freq: Four times a day (QID) | RECTAL | Status: DC | PRN

## 2013-04-16 MED ORDER — OXYCODONE HCL 5 MG PO TABS: 5.0000 mg | ORAL_TABLET | Freq: Four times a day (QID) | ORAL | Status: DC | PRN

## 2013-04-16 NOTE — Progress Notes (Signed)
Inpatient Diabetes Program Recommendations  AACE/ADA: New Consensus Statement on Inpatient Glycemic Control (2013)  Target Ranges:  Prepandial:   less than 140 mg/dL      Peak postprandial:   less than 180 mg/dL (1-2 hours)      Critically ill patients:  140 - 180 mg/dL   Reason for Visit: Hyperglycemia  Results for [name], [name] (MRN [id number]) as of 04/16/2013 11:17  Ref. Range 04/15/2013 23:50 04/16/2013 04:03 04/16/2013 07:25 04/16/2013 11:03  Glucose-Capillary Latest Range: 70-99 mg/dL 205 (H) 231 (H) 275 (H) 154 (H)    Inpatient Diabetes Program Recommendations Insulin - Basal: . Insulin - Meal Coverage: Add Novolog 3 units Q4 hours for TF coverage HgbA1C: 8.9% on 02/06/2013 - uncontrolled  Note: Will continue to follow.  Thank you. Lorenda Peck, RD, LDN, CDE Inpatient Diabetes Coordinator [phone]

## 2013-04-16 NOTE — Discharge Summary (Signed)
Physician Discharge Summary  [name] [id number] DOB: 06/07/1971 DOA: 04/10/2013  PCP: [name], MD  Admit date: 04/10/2013 Discharge date: 04/16/2013  Time spent: 45 minutes  Recommendations for Outpatient Follow-up:  -Advised to follow up with her PCP in 2 weeks.   Discharge Diagnoses:  Principal Problem:   Intractable nausea and vomiting Active Problems:   Gastroparesis   Hypertension   DM (diabetes mellitus), type 2, uncontrolled   Hypokalemia   Hyponatremia   Abdominal pain   Attention to gastrostomy tube   Discharge Condition: Stable and improved  Filed Weights   04/11/13 0449  Weight: 61.3 kg (135 lb 2.3 oz)    History of present illness:  Patient is a 42 yo female h/o gastroparesis, dm, s/p gtube placement last month, seizure d/o, copd, chronic pain syndrome comes in with n/v and abd pain today. Has not been able to keep any of her meds down. This is similar to her previous gastroparesis flares. Denies any fevers. Denies any blood in vomit. No diarrhea. We were asked to admit her for further evaluation and management.   Hospital Course:   Nausea/Vomiting  -2/2 severe gastroparesis plus coiling of G tube in esophagus.  -Still has some nausea/dry heaving and some abdominal pain that she recognizes as being chronic from her gastroparesis.  -Tube feeds have been at goal rate of 60 ml/hr.  Hypokalemia  -Repleted.   DM  -Fair control. -OP follow up for continued care of her DM and medication adjustments.   Gastroparesis  -2/2 DM.  -As above.   Procedures:  None   Consultations:  None   Discharge Instructions  Discharge Orders   Future Orders Complete By Expires   Diet - low sodium heart healthy  As directed    Increase activity slowly  As directed    PICC line removal  As directed        Medication List    STOP taking these medications       docusate 50 MG/5ML liquid  Commonly known as:  COLACE     HYDROmorphone 2 MG/ML Soln  injection  Commonly known as:  DILAUDID     insulin aspart 100 UNIT/ML injection  Commonly known as:  novoLOG     LORazepam 2 MG/ML injection  Commonly known as:  ATIVAN     metoCLOPramide 5 MG/ML injection  Commonly known as:  REGLAN     metoprolol 1 MG/ML injection  Commonly known as:  LOPRESSOR     pantoprazole 40 MG injection  Commonly known as:  PROTONIX     promethazine 25 MG/ML injection  Commonly known as:  PHENERGAN  Replaced by:  promethazine 12.5 MG suppository     torsemide 10 MG tablet  Commonly known as:  DEMADEX      TAKE these medications       amLODipine 10 MG tablet  Commonly known as:  NORVASC  Place 1 tablet (10 mg total) into feeding tube daily.     atorvastatin 40 MG tablet  Commonly known as:  LIPITOR  Place 1 tablet (40 mg total) into feeding tube daily at 6 PM.     bisacodyl 10 MG suppository  Commonly known as:  DULCOLAX  Place 1 suppository (10 mg total) rectally daily.     busPIRone 5 MG tablet  Commonly known as:  BUSPAR  Place 5 mg into feeding tube 3 (three) times daily.     feeding supplement (OSMOLITE 1.2 CAL) Liqd  Place 1,000 mLs into  feeding tube continuous. Rate:35ml/hr     Fluticasone-Salmeterol 250-50 MCG/DOSE Aepb  Commonly known as:  ADVAIR  Inhale 1 puff into the lungs 2 (two) times daily.     gabapentin 300 MG capsule  Commonly known as:  NEURONTIN  Place 2 capsules (600 mg total) into feeding tube 3 (three) times daily.     lisinopril 10 MG tablet  Commonly known as:  PRINIVIL,ZESTRIL  10 mg. 1 tablet (10 mg total) by Per J Tube route daily.     multivitamin with minerals Tabs tablet  Place 1 tablet into feeding tube daily.     oxyCODONE 5 MG immediate release tablet  Commonly known as:  Oxy IR/ROXICODONE  Place 1-2 tablets (5-10 mg total) into feeding tube every 6 (six) hours as needed.     potassium chloride 8 MEQ tablet  Commonly known as:  KLOR-CON  8 mEq. Take 1 tablet (8 mEq total) by mouth daily.      promethazine 12.5 MG suppository  Commonly known as:  PHENERGAN  Place 1 suppository (12.5 mg total) rectally every 6 (six) hours as needed for nausea.     sucralfate 1 GM/10ML suspension  Commonly known as:  CARAFATE  Place 10 mLs (1 g total) into feeding tube 4 (four) times daily -  with meals and at bedtime.     zolpidem 10 MG tablet  Commonly known as:  AMBIEN  Take 1 tablet (10 mg total) by mouth at bedtime as needed for sleep.       Allergies  Allergen Reactions  . Penicillins Hives  . Cefadroxil     Not sure of effects-listed as allergy on list from home  . Compazine Hives    Tolerates promethazine  . Darvocet [Propoxyphene-Acetaminophen] Other (See Comments)    UNKNOWN  . Nsaids Hives  . Zofran [Ondansetron] Hives       Follow-up Information   Follow up with GARBA,LAWAL, MD. Schedule an appointment as soon as possible for a visit in 2 weeks.   Specialty:  Internal Medicine   Contact information:   [address] [place] [address]        The results of significant diagnostics from this hospitalization (including imaging, microbiology, ancillary and laboratory) are listed below for reference.    Significant Diagnostic Studies: Ir Fluoro Guide Cv Line Right  04/11/2013   *RADIOLOGY REPORT*  Clinical Data: Poor venous access.  Powered PICC LINE PLACEMENT WITH ULTRASOUND AND FLUOROSCOPIC GUIDANCE  Fluoroscopy Time: 0 minutes, 24 seconds  The right arm was prepped with chlorhexidine, draped in the usual sterile fashion using maximum barrier technique (cap and mask, sterile gown, sterile gloves, large sterile sheet, hand hygiene and cutaneous antisepsis) and infiltrated locally with 1% Lidocaine.  Ultrasound demonstrated patency of the right brachial vein, and this was documented with an image.  Under real-time ultrasound guidance, this vein was accessed with a 21 gauge micropuncture needle and image documentation was performed.  The needle was exchanged over a  guidewire for a peel-away sheath through which a five [place] dual lumen PICC trimmed to 43 cm was advanced, positioned with its tip at the lower SVC/right atrial junction. Fluoroscopy during the procedure and fluoro spot radiograph confirms appropriate catheter position.  The catheter was flushed, secured to the skin with Prolene sutures, and covered with a sterile dressing.  Complications:  None  IMPRESSION: Successful right arm poweredPICC line placement with ultrasound and fluoroscopic guidance.  The catheter is ready for use.  Read by: Lennette Bihari  Bruning, P.A,-C   Original Report Authenticated By: Marybelle Killings, M.D.   Ir Replc Duoden/jejuno Tube Percut W/fluoro  04/11/2013   *RADIOLOGY REPORT*  Clinical Data: Gastrojejunostomy tube looped in the esophagus.  IR REPLACE DUODEN/JEJUNO TUBE PERCUT WITH FLOURO  Procedure:  Initial imaging demonstrates that the gastrojejunal tube is looped in the esophagus.  It was exchanged over a glide wire for a Kumpe catheter.  Access into the duodenum was not possible.  The patient continue to vomit during the examination. The gastrojejunostomy tube was then reinserted over the glide wire and looped in the stomach.  5 ml saline was utilized to inflate the balloon.  It was secured in place.  Contrast was injected.  Findings: The tip of the gastrojejunostomy tube was pulled from the esophagus and is now looped in the stomach.  IMPRESSION: Gastrojejunostomy tube was looped in the stomach.   Original Report Authenticated By: Marybelle Killings, M.D.   Ir US Guide Vasc Access Right  04/11/2013   *RADIOLOGY REPORT*  Clinical Data: Poor venous access.  Powered PICC LINE PLACEMENT WITH ULTRASOUND AND FLUOROSCOPIC GUIDANCE  Fluoroscopy Time: 0 minutes, 24 seconds  The right arm was prepped with chlorhexidine, draped in the usual sterile fashion using maximum barrier technique (cap and mask, sterile gown, sterile gloves, large sterile sheet, hand hygiene and cutaneous antisepsis) and infiltrated  locally with 1% Lidocaine.  Ultrasound demonstrated patency of the right brachial vein, and this was documented with an image.  Under real-time ultrasound guidance, this vein was accessed with a 21 gauge micropuncture needle and image documentation was performed.  The needle was exchanged over a guidewire for a peel-away sheath through which a five [place] dual lumen PICC trimmed to 43 cm was advanced, positioned with its tip at the lower SVC/right atrial junction. Fluoroscopy during the procedure and fluoro spot radiograph confirms appropriate catheter position.  The catheter was flushed, secured to the skin with Prolene sutures, and covered with a sterile dressing.  Complications:  None  IMPRESSION: Successful right arm poweredPICC line placement with ultrasound and fluoroscopic guidance.  The catheter is ready for use.  Read by: Ascencion Dike, P.A,-C   Original Report Authenticated By: Marybelle Killings, M.D.   Dg Abd Acute W/chest  04/11/2013   *RADIOLOGY REPORT*  Clinical Data: Upper abdominal pain and vomiting for 5 days.  ACUTE ABDOMEN SERIES (ABDOMEN 2 VIEW & CHEST 1 VIEW)  Comparison: 03/31/2013  Findings: Normal heart size and pulmonary vascularity.  No focal airspace consolidation in the lungs.  Mild peribronchial thickening and central interstitial changes likely representing chronic bronchitis.  No blunting of costophrenic angles.  No pneumothorax. There is a gastrostomy tube which extends up into the mid esophagus in the loops down work with tip in the left upper quadrant.  The course of the tube is stable since previous study.  Gas and stool in the colon.  No small or large bowel distension. No free intra-abdominal air.  No abnormal air fluid levels. Surgical clips in the right upper quadrant with bowel staples in the mid abdomen bilaterally.  Postoperative changes in the lower lumbar spine.  No radiopaque stones.  Calcified phleboliths in the pelvis.  IMPRESSION: No evidence of active pulmonary disease.   Chronic bronchitic changes.  Nonobstructive bowel gas pattern.  Malpositioned gastrostomy tube is stable since previous study.   Original Report Authenticated By: Lucienne Capers, M.D.   Dg Abd Acute W/chest  03/31/2013   *RADIOLOGY REPORT*  Clinical Data: Vomiting.  Abdominal pain throughout  the abdomen.  ACUTE ABDOMEN SERIES (ABDOMEN 2 VIEW & CHEST 1 VIEW)  Comparison: 02/19/2013.  Findings: Low volume lungs.  No infiltrate or edema.  No effusion or pneumothorax.  Unchanged density at the first costochondral junctions bilaterally.  No cardiomegaly.  Coronary arterial stenting.  Malpositioned gastrojejunostomy tube, which is looped in the lower esophagus with tip in the upper stomach body, near the retention balloon.  Nonobstructive bowel gas pattern. There is contrast within the colon, presumably from an outside study since tube placement 02/20/2013. No pneumoperitoneum.  No abnormal intra-abdominal mass effect.  Cholecystectomy clips.  L4-5 PLIF.  IMPRESSION:  1.  Malpositioned gastrojejunostomy tube, which loops in the lower esophagus before terminating in the upper stomach. 2.  Nonobstructive bowel gas pattern.   Original Report Authenticated By: Jorje Guild    Microbiology: No results found for this or any previous visit (from the past 240 hour(s)).   Labs: Basic Metabolic Panel:  Recent Labs Lab 04/11/13 0140 04/11/13 1840 04/12/13 0505 04/13/13 0545 04/16/13 0535  NA 127*  --  133* 130* 136  K 2.7*  --  3.6 3.2* 3.4*  CL 83*  --  94* 95* 101  CO2 28  --  30 26 26   GLUCOSE 388*  --  147* 81 229*  BUN 9  --  22 27* 8  CREATININE 0.46*  --  1.46* 1.02 0.60  CALCIUM 9.4  --  8.8 8.6 8.6  MG  --  1.5  --   --   --    Liver Function Tests:  Recent Labs Lab 04/11/13 0140  AST 17  ALT 15  ALKPHOS 131*  BILITOT 0.3  PROT 7.7  ALBUMIN 3.2*    Recent Labs Lab 04/11/13 0140  LIPASE 37   No results found for this basename: AMMONIA,  in the last 168 hours CBC:  Recent  Labs Lab 04/11/13 0140 04/12/13 0505 04/13/13 0545  WBC 10.6* 8.9 6.4  NEUTROABS 8.6*  --   --   HGB 13.0 11.9* 10.0*  HCT 37.4 35.6* 30.3*  MCV 84.6 87.0 87.8  PLT 172 191 167   Cardiac Enzymes: No results found for this basename: CKTOTAL, CKMB, CKMBINDEX, TROPONINI,  in the last 168 hours BNP: BNP (last 3 results) No results found for this basename: PROBNP,  in the last 8760 hours CBG:  Recent Labs Lab 04/15/13 2020 04/15/13 2350 04/16/13 0403 04/16/13 0725 04/16/13 1103  GLUCAP 174* 205* 231* 275* 154*       Signed:  HERNANDEZ ACOSTA,[name]  [facility] Pager: [phone] 04/16/2013, 1:20 PM

## 2013-05-01 ENCOUNTER — Encounter (HOSPITAL_COMMUNITY): Payer: Self-pay

## 2013-05-01 ENCOUNTER — Emergency Department (HOSPITAL_COMMUNITY): Payer: Medicare Other

## 2013-05-01 ENCOUNTER — Inpatient Hospital Stay (HOSPITAL_COMMUNITY): Payer: Medicare Other

## 2013-05-01 ENCOUNTER — Inpatient Hospital Stay (HOSPITAL_COMMUNITY)
Admission: EM | Admit: 2013-05-01 | Discharge: 2013-05-07 | DRG: 637 | Disposition: A | Discharge status: Home / Self Care | Payer: Medicare Other | Attending: Internal Medicine | Admitting: Internal Medicine

## 2013-05-01 DIAGNOSIS — K219 Gastro-esophageal reflux disease without esophagitis: Secondary | ICD-10-CM | POA: Diagnosis present

## 2013-05-01 DIAGNOSIS — E43 Unspecified severe protein-calorie malnutrition: Secondary | ICD-10-CM | POA: Insufficient documentation

## 2013-05-01 DIAGNOSIS — D72829 Elevated white blood cell count, unspecified: Secondary | ICD-10-CM | POA: Diagnosis present

## 2013-05-01 DIAGNOSIS — E871 Hypo-osmolality and hyponatremia: Secondary | ICD-10-CM | POA: Diagnosis present

## 2013-05-01 DIAGNOSIS — Z431 Encounter for attention to gastrostomy: Secondary | ICD-10-CM

## 2013-05-01 DIAGNOSIS — E785 Hyperlipidemia, unspecified: Secondary | ICD-10-CM | POA: Diagnosis present

## 2013-05-01 DIAGNOSIS — J449 Chronic obstructive pulmonary disease, unspecified: Secondary | ICD-10-CM | POA: Diagnosis present

## 2013-05-01 DIAGNOSIS — I2510 Atherosclerotic heart disease of native coronary artery without angina pectoris: Secondary | ICD-10-CM | POA: Diagnosis present

## 2013-05-01 DIAGNOSIS — J4489 Other specified chronic obstructive pulmonary disease: Secondary | ICD-10-CM | POA: Diagnosis present

## 2013-05-01 DIAGNOSIS — R109 Unspecified abdominal pain: Secondary | ICD-10-CM

## 2013-05-01 DIAGNOSIS — R569 Unspecified convulsions: Secondary | ICD-10-CM | POA: Diagnosis present

## 2013-05-01 DIAGNOSIS — Z87891 Personal history of nicotine dependence: Secondary | ICD-10-CM

## 2013-05-01 DIAGNOSIS — K3184 Gastroparesis: Secondary | ICD-10-CM | POA: Diagnosis present

## 2013-05-01 DIAGNOSIS — E1142 Type 2 diabetes mellitus with diabetic polyneuropathy: Secondary | ICD-10-CM | POA: Diagnosis present

## 2013-05-01 DIAGNOSIS — E1010 Type 1 diabetes mellitus with ketoacidosis without coma: Principal | ICD-10-CM | POA: Diagnosis present

## 2013-05-01 DIAGNOSIS — E876 Hypokalemia: Secondary | ICD-10-CM | POA: Diagnosis present

## 2013-05-01 DIAGNOSIS — E1165 Type 2 diabetes mellitus with hyperglycemia: Secondary | ICD-10-CM

## 2013-05-01 DIAGNOSIS — E1049 Type 1 diabetes mellitus with other diabetic neurological complication: Secondary | ICD-10-CM | POA: Diagnosis present

## 2013-05-01 DIAGNOSIS — K861 Other chronic pancreatitis: Secondary | ICD-10-CM | POA: Diagnosis present

## 2013-05-01 DIAGNOSIS — K9423 Gastrostomy malfunction: Secondary | ICD-10-CM | POA: Diagnosis present

## 2013-05-01 DIAGNOSIS — R1110 Vomiting, unspecified: Secondary | ICD-10-CM

## 2013-05-01 DIAGNOSIS — Z79899 Other long term (current) drug therapy: Secondary | ICD-10-CM

## 2013-05-01 DIAGNOSIS — Z794 Long term (current) use of insulin: Secondary | ICD-10-CM

## 2013-05-01 DIAGNOSIS — G8929 Other chronic pain: Secondary | ICD-10-CM | POA: Diagnosis present

## 2013-05-01 DIAGNOSIS — I10 Essential (primary) hypertension: Secondary | ICD-10-CM | POA: Diagnosis present

## 2013-05-01 DIAGNOSIS — E1065 Type 1 diabetes mellitus with hyperglycemia: Secondary | ICD-10-CM | POA: Diagnosis present

## 2013-05-01 DIAGNOSIS — I252 Old myocardial infarction: Secondary | ICD-10-CM

## 2013-05-01 DIAGNOSIS — R739 Hyperglycemia, unspecified: Secondary | ICD-10-CM

## 2013-05-01 LAB — CBC WITH DIFFERENTIAL/PLATELET
Basophils Relative: 0 % (ref 0–1)
Eosinophils Absolute: 0 10*3/uL (ref 0.0–0.7)
Lymphs Abs: 1.7 10*3/uL (ref 0.7–4.0)
MCH: 30.1 pg (ref 26.0–34.0)
Neutro Abs: 10.9 10*3/uL — ABNORMAL HIGH (ref 1.7–7.7)
Neutrophils Relative %: 84 % — ABNORMAL HIGH (ref 43–77)
Platelets: 283 10*3/uL (ref 150–400)
RBC: 4.68 MIL/uL (ref 3.87–5.11)

## 2013-05-01 LAB — GLUCOSE, CAPILLARY
Glucose-Capillary: 420 mg/dL — ABNORMAL HIGH (ref 70–99)
Glucose-Capillary: 392 mg/dL — ABNORMAL HIGH (ref 70–99)
Glucose-Capillary: 238 mg/dL — ABNORMAL HIGH (ref 70–99)
Glucose-Capillary: 181 mg/dL — ABNORMAL HIGH (ref 70–99)
Glucose-Capillary: 129 mg/dL — ABNORMAL HIGH (ref 70–99)

## 2013-05-01 LAB — BASIC METABOLIC PANEL
BUN: 19 mg/dL (ref 6–23)
CO2: 25 mEq/L (ref 19–32)
Calcium: 9.8 mg/dL (ref 8.4–10.5)
Calcium: 9.6 mg/dL (ref 8.4–10.5)
Chloride: 82 mEq/L — ABNORMAL LOW (ref 96–112)
GFR calc Af Amer: 79 mL/min — ABNORMAL LOW (ref >90)
GFR calc non Af Amer: >90 mL/min (ref >90)
GFR calc non Af Amer: 68 mL/min — ABNORMAL LOW (ref >90)
Glucose, Bld: 143 mg/dL — ABNORMAL HIGH (ref 70–99)
Potassium: 3.3 mEq/L — ABNORMAL LOW (ref 3.5–5.1)
Sodium: 126 mEq/L — ABNORMAL LOW (ref 135–145)
Sodium: 136 mEq/L (ref 135–145)

## 2013-05-01 LAB — TROPONIN I: Troponin I: <0.3 ng/mL (ref <0.30)

## 2013-05-01 LAB — MRSA PCR SCREENING: MRSA by PCR: NEGATIVE

## 2013-05-01 LAB — COMPREHENSIVE METABOLIC PANEL
Albumin: 3.5 g/dL (ref 3.5–5.2)
BUN: 14 mg/dL (ref 6–23)
Calcium: 10.4 mg/dL (ref 8.4–10.5)
Creatinine, Ser: 0.56 mg/dL (ref 0.50–1.10)
Total Bilirubin: 0.3 mg/dL (ref 0.3–1.2)
Total Protein: 8.6 g/dL — ABNORMAL HIGH (ref 6.0–8.3)

## 2013-05-01 LAB — HEMOGLOBIN A1C
Hgb A1c MFr Bld: 10.2 % — ABNORMAL HIGH (ref <5.7)
Mean Plasma Glucose: 246 mg/dL — ABNORMAL HIGH (ref <117)

## 2013-05-01 LAB — LIPASE, BLOOD: Lipase: 37 U/L (ref 11–59)

## 2013-05-01 LAB — AMYLASE: Amylase: 78 U/L (ref 0–105)

## 2013-05-01 MED ORDER — SODIUM CHLORIDE 0.9 % IV SOLN
INTRAVENOUS | Status: DC
  Administered 2013-05-01: 14:00:00 via INTRAVENOUS

## 2013-05-01 MED ORDER — METOCLOPRAMIDE HCL 5 MG/ML IJ SOLN
10.0000 mg | Freq: Once | INTRAMUSCULAR | Status: AC
  Administered 2013-05-01: 10 mg via INTRAMUSCULAR
  Filled 2013-05-01: qty 2

## 2013-05-01 MED ORDER — SODIUM CHLORIDE 0.9 % IV SOLN: INTRAVENOUS | Status: DC

## 2013-05-01 MED ORDER — PROMETHAZINE HCL 25 MG RE SUPP: 12.5000 mg | Freq: Four times a day (QID) | RECTAL | Status: DC | PRN

## 2013-05-01 MED ORDER — DEXTROSE-NACL 5-0.45 % IV SOLN
INTRAVENOUS | Status: DC
  Administered 2013-05-01: 20 mL/h via INTRAVENOUS
  Administered 2013-05-01 – 2013-05-02 (×2): 125 mL/h via INTRAVENOUS
  Administered 2013-05-03 – 2013-05-04 (×4): via INTRAVENOUS

## 2013-05-01 MED ORDER — HYDRALAZINE HCL 20 MG/ML IJ SOLN
5.0000 mg | Freq: Four times a day (QID) | INTRAMUSCULAR | Status: DC | PRN
  Administered 2013-05-01: 5 mg via INTRAVENOUS
  Filled 2013-05-01: qty 0.25

## 2013-05-01 MED ORDER — METOPROLOL TARTRATE 1 MG/ML IV SOLN
5.0000 mg | Freq: Four times a day (QID) | INTRAVENOUS | Status: DC
  Filled 2013-05-01 (×4): qty 5

## 2013-05-01 MED ORDER — HYDROMORPHONE HCL PF 2 MG/ML IJ SOLN
2.0000 mg | Freq: Once | INTRAMUSCULAR | Status: AC
  Administered 2013-05-01: 2 mg via INTRAMUSCULAR
  Filled 2013-05-01: qty 1

## 2013-05-01 MED ORDER — OXYCODONE HCL 5 MG PO TABS: 5.0000 mg | ORAL_TABLET | Freq: Four times a day (QID) | ORAL | Status: DC | PRN

## 2013-05-01 MED ORDER — ENOXAPARIN SODIUM 40 MG/0.4ML ~~LOC~~ SOLN
40.0000 mg | SUBCUTANEOUS | Status: DC
  Administered 2013-05-01 – 2013-05-06 (×6): 40 mg via SUBCUTANEOUS
  Filled 2013-05-01 (×7): qty 0.4

## 2013-05-01 MED ORDER — SODIUM CHLORIDE 0.9 % IV SOLN
INTRAVENOUS | Status: DC
  Administered 2013-05-01: 11:00:00 via INTRAVENOUS

## 2013-05-01 MED ORDER — ZOLPIDEM TARTRATE 10 MG PO TABS: 10.0000 mg | ORAL_TABLET | Freq: Every evening | ORAL | Status: DC | PRN

## 2013-05-01 MED ORDER — INSULIN ASPART 100 UNIT/ML ~~LOC~~ SOLN
0.0000 [IU] | SUBCUTANEOUS | Status: DC
Start: 2013-05-01 — End: 2013-05-01
  Administered 2013-05-01: 15 [IU] via SUBCUTANEOUS
  Filled 2013-05-01: qty 1

## 2013-05-01 MED ORDER — INSULIN REGULAR BOLUS VIA INFUSION: 0.0000 [IU] | Freq: Three times a day (TID) | INTRAVENOUS | Status: DC

## 2013-05-01 MED ORDER — LISINOPRIL 10 MG PO TABS
10.0000 mg | ORAL_TABLET | Freq: Every day | ORAL | Status: DC
  Filled 2013-05-01: qty 1

## 2013-05-01 MED ORDER — ZOLPIDEM TARTRATE 5 MG PO TABS: 5.0000 mg | ORAL_TABLET | Freq: Every evening | ORAL | Status: DC | PRN

## 2013-05-01 MED ORDER — IOHEXOL 300 MG/ML  SOLN
20.0000 mL | Freq: Once | INTRAMUSCULAR | Status: AC | PRN
  Administered 2013-05-01: 25 mL

## 2013-05-01 MED ORDER — SUCRALFATE 1 GM/10ML PO SUSP
1.0000 g | Freq: Three times a day (TID) | ORAL | Status: DC
  Administered 2013-05-01: 1 g
  Filled 2013-05-01 (×7): qty 10

## 2013-05-01 MED ORDER — HYDROMORPHONE HCL PF 1 MG/ML IJ SOLN
1.0000 mg | INTRAMUSCULAR | Status: DC | PRN
  Administered 2013-05-01 – 2013-05-04 (×22): 1 mg via INTRAVENOUS
  Filled 2013-05-01 (×22): qty 1

## 2013-05-01 MED ORDER — AMLODIPINE BESYLATE 10 MG PO TABS
10.0000 mg | ORAL_TABLET | Freq: Every day | ORAL | Status: DC
  Filled 2013-05-01 (×2): qty 1

## 2013-05-01 MED ORDER — GABAPENTIN 300 MG PO CAPS
600.0000 mg | ORAL_CAPSULE | Freq: Three times a day (TID) | ORAL | Status: DC
  Filled 2013-05-01 (×5): qty 2

## 2013-05-01 MED ORDER — DEXTROSE 50 % IV SOLN: 25.0000 mL | INTRAVENOUS | Status: DC | PRN

## 2013-05-01 MED ORDER — INSULIN REGULAR BOLUS VIA INFUSION
0.0000 [IU] | Freq: Three times a day (TID) | INTRAVENOUS | Status: DC
  Filled 2013-05-01: qty 10

## 2013-05-01 MED ORDER — INSULIN ASPART 100 UNIT/ML ~~LOC~~ SOLN: 0.0000 [IU] | Freq: Three times a day (TID) | SUBCUTANEOUS | Status: DC

## 2013-05-01 MED ORDER — LABETALOL HCL 5 MG/ML IV SOLN
10.0000 mg | INTRAVENOUS | Status: DC | PRN
  Administered 2013-05-01 (×2): 10 mg via INTRAVENOUS
  Filled 2013-05-01: qty 4

## 2013-05-01 MED ORDER — PROMETHAZINE HCL 25 MG/ML IJ SOLN
25.0000 mg | Freq: Once | INTRAMUSCULAR | Status: AC
  Administered 2013-05-01: 25 mg via INTRAMUSCULAR
  Filled 2013-05-01: qty 1

## 2013-05-01 MED ORDER — PANTOPRAZOLE SODIUM 40 MG IV SOLR
40.0000 mg | INTRAVENOUS | Status: DC
  Administered 2013-05-01 – 2013-05-05 (×5): 40 mg via INTRAVENOUS
  Filled 2013-05-01 (×7): qty 40

## 2013-05-01 MED ORDER — DEXTROSE-NACL 5-0.45 % IV SOLN: INTRAVENOUS | Status: DC

## 2013-05-01 MED ORDER — PROMETHAZINE HCL 25 MG/ML IJ SOLN
12.5000 mg | INTRAMUSCULAR | Status: DC | PRN
  Administered 2013-05-01: 25 mg via INTRAVENOUS
  Administered 2013-05-02 (×2): 12.5 mg via INTRAVENOUS
  Administered 2013-05-03 (×2): 25 mg via INTRAVENOUS
  Administered 2013-05-03: 23:00:00 12.5 mg via INTRAVENOUS
  Administered 2013-05-03: 25 mg via INTRAVENOUS
  Administered 2013-05-03 – 2013-05-04 (×2): 12.5 mg via INTRAVENOUS
  Administered 2013-05-04 (×3): 25 mg via INTRAVENOUS
  Administered 2013-05-05 – 2013-05-06 (×4): 12.5 mg via INTRAVENOUS
  Administered 2013-05-06 – 2013-05-07 (×7): 25 mg via INTRAVENOUS
  Filled 2013-05-01 (×25): qty 1

## 2013-05-01 MED ORDER — INSULIN ASPART 100 UNIT/ML ~~LOC~~ SOLN: 0.0000 [IU] | Freq: Every day | SUBCUTANEOUS | Status: DC

## 2013-05-01 MED ORDER — METOCLOPRAMIDE HCL 5 MG/ML IJ SOLN
5.0000 mg | Freq: Four times a day (QID) | INTRAMUSCULAR | Status: DC
  Administered 2013-05-01 – 2013-05-06 (×21): 5 mg via INTRAVENOUS
  Filled 2013-05-01: qty 2
  Filled 2013-05-01 (×4): qty 1
  Filled 2013-05-01: qty 2
  Filled 2013-05-01 (×6): qty 1
  Filled 2013-05-01 (×2): qty 2
  Filled 2013-05-01 (×3): qty 1
  Filled 2013-05-01: qty 2
  Filled 2013-05-01 (×2): qty 1
  Filled 2013-05-01: qty 2
  Filled 2013-05-01 (×5): qty 1
  Filled 2013-05-01 (×3): qty 2

## 2013-05-01 MED ORDER — BUSPIRONE HCL 5 MG PO TABS
5.0000 mg | ORAL_TABLET | Freq: Three times a day (TID) | ORAL | Status: DC
  Filled 2013-05-01 (×5): qty 1

## 2013-05-01 MED ORDER — MOMETASONE FURO-FORMOTEROL FUM 100-5 MCG/ACT IN AERO
2.0000 | INHALATION_SPRAY | Freq: Two times a day (BID) | RESPIRATORY_TRACT | Status: DC
  Administered 2013-05-02 – 2013-05-07 (×11): 2 via RESPIRATORY_TRACT
  Filled 2013-05-01: qty 8.8

## 2013-05-01 MED ORDER — SODIUM CHLORIDE 0.9 % IV SOLN
INTRAVENOUS | Status: DC
  Administered 2013-05-01: 3.3 [IU]/h via INTRAVENOUS
  Filled 2013-05-01: qty 1

## 2013-05-01 NOTE — ED Notes (Signed)
MD at bedside. 

## 2013-05-01 NOTE — Procedures (Signed)
Malpositioned GJ tube.  Unable to cannulate the distal stomach or small bowel.   Unable to place a GJ tube, therefore, an 46 [place] G-tube was placed.  Consider endoscopic placement.

## 2013-05-01 NOTE — H&P (Addendum)
[facility] History and Physical  [name] [id number] DOB: [birth year] DOA: 05/01/2013  Referring physician: Dr. Betsey Holiday PCP: [name], MD  Specialists: Dr Penelope Coop.   Chief Complaint: intractable nausea and vomiting today.  HPI: [name] is a 42 y.o. female with with h/o DM, recently discharged from the hospital after being treated for similar complaints, comes back for intractable nausea and vomiting and reports the g tube came out of her mouth during an episode of emesis, but was able to swallow it back. abd film showed g tube in the esophagus. Her symptoms improved transiently in the ED. She was also found to be in mild DKA on admission. She was started on glucostabilizer in ED, and her  cbgs' improved as per EDP.  Hence she was initially admitted to med floor on SSI. After arrival to the floor, she continued to vomit, and her CBG'S remained elevated. She was later transferred to step down for further management. IR Consulted for repositioning the G tube. GI consulted for arrange for persistent symptoms, for continued outpatient follow up and for possible need to surgically reposition the PEJ tube.    Review of Systems: The patient denies fever,  vision loss, decreased hearing, , chest pain, syncope, dyspnea on exertion, peripheral edema, balance deficits, hemoptysis, abdominal pain, melena, hematochezia, severe indigestion/heartburn, hematuria, incontinence, genital sores, muscle weakness, suspicious skin lesions, transient blindness, difficulty walking, depression, unusual weight change, abnormal bleeding, enlarged lymph nodes, angioedema, and breast masses.    Past Medical History  Diagnosis Date  . Hypertension   . Diabetes mellitus   . Gastroparesis   . Asthma   . GERD (gastroesophageal reflux disease)   . Coronary artery disease   . h/o seizure   . Neuropathy   . Chronic pancreatitis   . Dyslipidemia   . MI (myocardial infarction)   . COPD (chronic obstructive  pulmonary disease)   . Seizures    Past Surgical History  Procedure Laterality Date  . Abdominal hysterectomy    . Cholecystectomy    . Peg tube x 4      feeding jejunostomies with PEG tubes  . Cesarean section    . Colon resection due to diverticulitis    . Back surgery    . Esophagogastroduodenoscopy N/A 12/27/2012    Procedure: ESOPHAGOGASTRODUODENOSCOPY (EGD);  Surgeon: Missy Sabins, MD;  Location: [facility];  Service: Endoscopy;  Laterality: N/A;  . Esophagogastroduodenoscopy (egd) with esophageal dilation N/A 02/19/2013    Procedure: ESOPHAGOGASTRODUODENOSCOPY (EGD) WITH ESOPHAGEAL DILATION;  Surgeon: Wonda Horner, MD;  Location: [facility];  Service: Endoscopy;  Laterality: N/A;  . Balloon dilation N/A 02/19/2013    Procedure: BALLOON DILATION;  Surgeon: Wonda Horner, MD;  Location: [facility];  Service: Endoscopy;  Laterality: N/A;   Social History:  reports that she has quit smoking. Her smoking use included Cigarettes. She has a .5 pack-year smoking history. She has never used smokeless tobacco. She reports that she does not drink alcohol or use illicit drugs.  where does patient live--home,   Can patient participate in ADLs? YES.  Allergies  Allergen Reactions  . Penicillins Hives  . Cefadroxil     Not sure of effects-listed as allergy on list from home  . Compazine Hives    Tolerates promethazine  . Darvocet [Propoxyphene-Acetaminophen] Other (See Comments)    UNKNOWN  . Nsaids Hives  . Zofran [Ondansetron] Hives    Family History  Problem Relation Age of Onset  . Cancer Mother  Prior to Admission medications   Medication Sig Start Date End Date Taking? Authorizing Provider  amLODipine (NORVASC) 10 MG tablet Place 1 tablet (10 mg total) into feeding tube daily. 02/20/13  Yes Robbie Lis, MD  atorvastatin (LIPITOR) 40 MG tablet Place 1 tablet (40 mg total) into feeding tube daily at 6 PM. 02/20/13  Yes Robbie Lis, MD  bisacodyl (DULCOLAX) 10 MG  suppository Place 1 suppository (10 mg total) rectally daily. 02/20/13  Yes Robbie Lis, MD  busPIRone (BUSPAR) 5 MG tablet Place 5 mg into feeding tube 3 (three) times daily.   Yes Historical Provider, MD  Fluticasone-Salmeterol (ADVAIR) 250-50 MCG/DOSE AEPB Inhale 1 puff into the lungs 2 (two) times daily. 12/02/11  Yes Robbie Lis, MD  gabapentin (NEURONTIN) 300 MG capsule Place 2 capsules (600 mg total) into feeding tube 3 (three) times daily. 02/20/13  Yes Robbie Lis, MD  insulin lispro (HUMALOG) 100 UNIT/ML injection Sliding scale 0-150= 0 units 151-200= 2 units 201-250= 4 units 251-300= 6 units 301-350= 8 units 351-400= 10 units >400 = 911 03/29/13  Yes Historical Provider, MD  lisinopril (PRINIVIL,ZESTRIL) 10 MG tablet 10 mg. 1 tablet (10 mg total) by Per J Tube route daily. 03/29/13  Yes Historical Provider, MD  Multiple Vitamin (MULTIVITAMIN WITH MINERALS) TABS Place 1 tablet into feeding tube daily. 02/20/13  Yes Robbie Lis, MD  Nutritional Supplements (FEEDING SUPPLEMENT, OSMOLITE 1.2 CAL,) LIQD Place 1,000 mLs into feeding tube continuous. Rate:87ml/hr   Yes Historical Provider, MD  oxyCODONE (OXY IR/ROXICODONE) 5 MG immediate release tablet Place 1-2 tablets (5-10 mg total) into feeding tube every 6 (six) hours as needed. 04/16/13  Yes Erline Hau, MD  pantoprazole (PROTONIX) 40 MG tablet Take 40 mg by mouth daily. 03/29/13  Yes Historical Provider, MD  potassium chloride (KLOR-CON) 8 MEQ tablet 8 mEq. Take 1 tablet (8 mEq total) by mouth daily. 03/29/13 03/29/14 Yes Historical Provider, MD  promethazine (PHENERGAN) 12.5 MG suppository Place 1 suppository (12.5 mg total) rectally every 6 (six) hours as needed for nausea. 04/16/13  Yes Estela Leonie Green, MD  sucralfate (CARAFATE) 1 GM/10ML suspension Place 10 mLs (1 g total) into feeding tube 4 (four) times daily -  with meals and at bedtime. 02/20/13  Yes Robbie Lis, MD  torsemide (DEMADEX) 10 MG tablet Take 10 mg by  mouth daily.   Yes Historical Provider, MD  zolpidem (AMBIEN) 10 MG tablet Take 1 tablet (10 mg total) by mouth at bedtime as needed for sleep. 02/20/13  Yes Robbie Lis, MD   Physical Exam: Filed Vitals:   05/01/13 1044  BP: 184/102  Pulse: 121  Temp: 98.3 F (36.8 C)  Resp:     Constitutional: Vital signs reviewed.  Patient is a well-developed and well-nourished  in mild distress but  cooperative with exam. Alert and oriented x3.  Head: Normocephalic and atraumatic Mouth: no erythema or exudates, MMM Eyes: PERRL, EOMI, conjunctivae normal, No scleral icterus.  Neck: Supple, Trachea midline normal ROM, No JVD, mass, thyromegaly, or carotid bruit present.  Cardiovascular: RR tachycardic, S1 normal, S2 normal, no MRG, pulses symmetric and intact bilaterally Pulmonary/Chest: normal respiratory effort, CTAB, no wheezes, rales, or rhonchi Abdominal: [email or web address] tender non-distended, bowel sounds are normal, no masses, organomegaly, or guarding present.  Musculoskeletal: No joint deformities, erythema, or stiffness, ROM full and no nontender.  Neurological: A&O x3, Strength is normal and symmetric bilaterally, cranial nerve II-XII are grossly intact, no focal  motor deficit, sensory intact to light touch bilaterally.  Skin: Warm, dry and intact. No rash, cyanosis, or clubbing.  Psychiatric: Normal mood and affect. speech and behavior is normal.    Labs on Admission:  Basic Metabolic Panel:  Recent Labs Lab 05/01/13 0814  NA 125*  K 4.3  CL 83*  CO2 21  GLUCOSE 504*  BUN 14  CREATININE 0.56  CALCIUM 10.4   Liver Function Tests:  Recent Labs Lab 05/01/13 0814  AST 31  ALT 23  ALKPHOS 159*  BILITOT 0.3  PROT 8.6*  ALBUMIN 3.5    Recent Labs Lab 05/01/13 0814  LIPASE 37  AMYLASE 78   No results found for this basename: AMMONIA,  in the last 168 hours CBC:  Recent Labs Lab 05/01/13 0908  WBC 13.1*  NEUTROABS 10.9*  HGB 14.1  HCT 39.4  MCV 84.2  PLT 283    Cardiac Enzymes:  Recent Labs Lab 05/01/13 0814  TROPONINI <0.30    BNP (last 3 results) No results found for this basename: PROBNP,  in the last 8760 hours CBG:  Recent Labs Lab 05/01/13 1100  GLUCAP 439*    Radiological Exams on Admission: Dg Abd Acute W/chest  05/01/2013   CLINICAL DATA:  Severe abdominal pain this morning, history pancreatitis, hypertension, diabetes, GERD, asthma, coronary artery disease post MI, COPD  EXAM: ACUTE ABDOMEN SERIES (ABDOMEN 2 VIEW & CHEST 1 VIEW)  COMPARISON:  04/11/2013  Correlation: CT abdomen 02/06/2013  FINDINGS: Normal heart size, mediastinal contours, and pulmonary vascularity.  Bronchitic changes without infiltrate, pleural effusion or pneumothorax.  Bones demineralized.  Normal bowel gas pattern.  No bowel dilatation, bowel wall thickening, or free intraperitoneal air.  Evidence of prior bowel surgery in the left upper quadrant.  Catheter projects over the stomach and mid/distal esophagus ; this was coiled within the stomach on a prior CT.  Post laminectomies of L4 and L5 with Ray cage fusion of L4-L5.  Scattered pelvic phleboliths without urinary tract calcification.  IMPRESSION: No acute cardiopulmonary abnormalities.  Normal bowel gas pattern.  Catheter projects over the stomach and mid to distal esophagus ; prior CT suggests this is a gastrostomy tube, the tip of which is now within the esophagus rather than within the stomach, consider repositioning.   Electronically Signed   By: Lavonia Dana M.D.   On: 05/01/2013 10:08    EKG: sinus tachy at 120/min.   Assessment/Plan Active Problems:  1. Intractable Nausea and vomiting:  - transfer pt to stepdown, and start her on glucostabilizer.  - symptomatic management with IV anti emetics, IV fluids,  IV reglan, IV protonix, IV pain control. IV narcotics could also be contributing to the symptoms.  - Gi consulted for further management.  - IR consulted for repositioning the J tube in to the  stomach.   2. Mild DKA:  - possibly from intractable nausea and vomiting.  - started on glucostabilizer.  - repeat BMP  Q12hr.  - bicarb is 25.  3. Leukocytosis;  - possibly reactive.  - UA ordered to evaluate for infection.  - abd film does not show any cardiopulmonary abnormalities.   4. DVT prophylaxis.   5. Hypertensive crisis: started the patient on IV labetolol and IV hydralazine. Continue to monitor.   Code Status: full code Family Communication: none at bedside Disposition Plan: admit to step down.   Time spent: 110 minutes.   [facility] Pager [phone] If 7PM-7AM, please contact night-coverage [email or web address] Password [email or web address] 05/01/2013, 11:16  AM

## 2013-05-01 NOTE — ED Notes (Signed)
Lab at bedside

## 2013-05-01 NOTE — ED Notes (Signed)
Per EMS, Pt, from home, c/o abdominal pain, epigastric pain and emesis starting last night.  Pain score 10/10.  Pt sts "I threw up my feeding tube, but swallowed it back down."  A & Ox4.

## 2013-05-01 NOTE — ED Notes (Signed)
Pt sts "I am an extremely hard stick.  They usually have a hard time starting IVs."

## 2013-05-01 NOTE — Progress Notes (Signed)
After receiving an order to insert a PICC I called ICU and spoke with Delorise Jackson, RN.   I informed her we do not insert her PICCs that IR has to do it.

## 2013-05-01 NOTE — ED Notes (Signed)
Bed: [id number] Expected date: 05/01/13 Expected time: 6:51 AM Means of arrival: Ambulance Comments: Pancreatitis/vomiting

## 2013-05-01 NOTE — ED Notes (Signed)
Patient transported to X-ray 

## 2013-05-01 NOTE — Progress Notes (Signed)
UR Completed.  

## 2013-05-01 NOTE — ED Provider Notes (Signed)
CSN: [id number]     Arrival date & time 05/01/13  0719 History   First MD Initiated Contact with Patient 05/01/13 [id number]     Chief Complaint  Patient presents with  . Abdominal Pain  . Emesis   (Consider location/radiation/quality/duration/timing/severity/associated sxs/prior Treatment) HPI Comments: Patient presents to the ER for evaluation of abdominal pain with nausea and vomiting. Patient reports his symptoms began earlier today. This has a history of gastroparesis. Patient reports severe diffuse abdominal pain. Patient reports that she has vomited multiple times. During one of the bouts of emesis, she reports that her feeding tube came out of her mouth. She swallowed it down, but it is still in the back of her throat.  Patient is a 42 y.o. female presenting with abdominal pain and vomiting.  Abdominal Pain Associated symptoms: vomiting   Emesis Associated symptoms: abdominal pain     Past Medical History  Diagnosis Date  . Hypertension   . Diabetes mellitus   . Gastroparesis   . Asthma   . GERD (gastroesophageal reflux disease)   . Coronary artery disease   . h/o seizure   . Neuropathy   . Chronic pancreatitis   . Dyslipidemia   . MI (myocardial infarction)   . COPD (chronic obstructive pulmonary disease)   . Seizures    Past Surgical History  Procedure Laterality Date  . Abdominal hysterectomy    . Cholecystectomy    . Peg tube x 4      feeding jejunostomies with PEG tubes  . Cesarean section    . Colon resection due to diverticulitis    . Back surgery    . Esophagogastroduodenoscopy N/A 12/27/2012    Procedure: ESOPHAGOGASTRODUODENOSCOPY (EGD);  Surgeon: Missy Sabins, MD;  Location: [facility];  Service: Endoscopy;  Laterality: N/A;  . Esophagogastroduodenoscopy (egd) with esophageal dilation N/A 02/19/2013    Procedure: ESOPHAGOGASTRODUODENOSCOPY (EGD) WITH ESOPHAGEAL DILATION;  Surgeon: Wonda Horner, MD;  Location: [facility];  Service: Endoscopy;  Laterality:  N/A;  . Balloon dilation N/A 02/19/2013    Procedure: BALLOON DILATION;  Surgeon: Wonda Horner, MD;  Location: [facility];  Service: Endoscopy;  Laterality: N/A;   Family History  Problem Relation Age of Onset  . Cancer Mother    History  Substance Use Topics  . Smoking status: Former Smoker -- 0.25 packs/day for 2 years    Types: Cigarettes  . Smokeless tobacco: Never Used  . Alcohol Use: No     Comment: hx 1 beer daily -pt states she quit Dec. 2012   OB History   Grav Para Term Preterm Abortions TAB SAB Ect Mult Living                 Review of Systems  Gastrointestinal: Positive for vomiting and abdominal pain.  All other systems reviewed and are negative.    Allergies  Penicillins; Cefadroxil; Compazine; Darvocet; Nsaids; and Zofran  Home Medications   Current Outpatient Rx  Name  Route  Sig  Dispense  Refill  . amLODipine (NORVASC) 10 MG tablet   Per Tube   Place 1 tablet (10 mg total) into feeding tube daily.   30 tablet   2   . atorvastatin (LIPITOR) 40 MG tablet   Per Tube   Place 1 tablet (40 mg total) into feeding tube daily at 6 PM.   30 tablet   0   . bisacodyl (DULCOLAX) 10 MG suppository   Rectal   Place 1 suppository (10 mg total)  rectally daily.   12 suppository   0   . busPIRone (BUSPAR) 5 MG tablet   Per Tube   Place 5 mg into feeding tube 3 (three) times daily.         . Fluticasone-Salmeterol (ADVAIR) 250-50 MCG/DOSE AEPB   Inhalation   Inhale 1 puff into the lungs 2 (two) times daily.   60 each   0   . gabapentin (NEURONTIN) 300 MG capsule   Per Tube   Place 2 capsules (600 mg total) into feeding tube 3 (three) times daily.   30 capsule   0   . lisinopril (PRINIVIL,ZESTRIL) 10 MG tablet      10 mg. 1 tablet (10 mg total) by Per J Tube route daily.         . Multiple Vitamin (MULTIVITAMIN WITH MINERALS) TABS   Per Tube   Place 1 tablet into feeding tube daily.   30 tablet   0   . Nutritional Supplements (FEEDING  SUPPLEMENT, OSMOLITE 1.2 CAL,) LIQD   Per Tube   Place 1,000 mLs into feeding tube continuous. Rate:44ml/hr         . oxyCODONE (OXY IR/ROXICODONE) 5 MG immediate release tablet   Per Tube   Place 1-2 tablets (5-10 mg total) into feeding tube every 6 (six) hours as needed.   30 tablet   0   . potassium chloride (KLOR-CON) 8 MEQ tablet      8 mEq. Take 1 tablet (8 mEq total) by mouth daily.         . promethazine (PHENERGAN) 12.5 MG suppository   Rectal   Place 1 suppository (12.5 mg total) rectally every 6 (six) hours as needed for nausea.   30 each   0   . sucralfate (CARAFATE) 1 GM/10ML suspension   Per Tube   Place 10 mLs (1 g total) into feeding tube 4 (four) times daily -  with meals and at bedtime.   420 mL   0   . zolpidem (AMBIEN) 10 MG tablet   Oral   Take 1 tablet (10 mg total) by mouth at bedtime as needed for sleep.   30 tablet   0    BP 193/118  Pulse 131  Temp(Src) 98.4 F (36.9 C) (Oral)  Resp 25  SpO2 100% Physical Exam  Constitutional: She is oriented to person, place, and time. She appears well-developed and well-nourished. No distress (actively vomiting).  HENT:  Head: Normocephalic and atraumatic.  Right Ear: Hearing normal.  Left Ear: Hearing normal.  Nose: Nose normal.  Mouth/Throat: Oropharynx is clear and moist and mucous membranes are normal.  Eyes: Conjunctivae and EOM are normal. Pupils are equal, round, and reactive to light.  Neck: Normal range of motion. Neck supple.  Cardiovascular: Regular rhythm, S1 normal and S2 normal.  Exam reveals no gallop and no friction rub.   No murmur heard. Pulmonary/Chest: Effort normal and breath sounds normal. No respiratory distress. She exhibits no tenderness.  Abdominal: Soft. Normal appearance and bowel sounds are normal. There is no hepatosplenomegaly. There is generalized tenderness. There is no rebound, no guarding, no tenderness at McBurney's point and negative Murphy's sign. No hernia.   Musculoskeletal: Normal range of motion.  Neurological: She is alert and oriented to person, place, and time. She has normal strength. No cranial nerve deficit or sensory deficit. Coordination normal. GCS eye subscore is 4. GCS verbal subscore is 5. GCS motor subscore is 6.  Skin: Skin is warm, dry and  intact. No rash noted. No cyanosis.  Psychiatric: She has a normal mood and affect. Her speech is normal and behavior is normal. Thought content normal.    ED Course  Procedures (including critical care time)  EKG:  Date: 05/01/2013  Rate: 129  Rhythm: sinus tachycardia  QRS Axis: left  Intervals: normal  ST/T Wave abnormalities: nonspecific ST/T changes  Conduction Disutrbances:none  Narrative Interpretation:   Old EKG Reviewed: unchanged    Angiocath insertion Performed by: Orpah Greek.  Consent: Verbal consent obtained. Risks and benefits: risks, benefits and alternatives were discussed Time out: Immediately prior to procedure a "time out" was called to verify the correct patient, procedure, equipment, support staff and site/side marked as required.  Preparation: Patient was prepped and draped in the usual sterile fashion.  Vein Location: right antecub  Ultrasound Guided  Gauge: 20  Normal blood return and flush without difficulty. No pulsatile flow. Patient tolerance: Patient tolerated the procedure well with no immediate complications.    Labs Review Labs Reviewed  COMPREHENSIVE METABOLIC PANEL - Abnormal; Notable for the following:    Sodium 125 (*)    Chloride 83 (*)    Glucose, Bld 504 (*)    Total Protein 8.6 (*)    Alkaline Phosphatase 159 (*)    All other components within normal limits  CBC WITH DIFFERENTIAL - Abnormal; Notable for the following:    WBC 13.1 (*)    Neutrophils Relative % 84 (*)    Neutro Abs 10.9 (*)    All other components within normal limits  TROPONIN I  LIPASE, BLOOD  AMYLASE  CBC WITH DIFFERENTIAL   Imaging  Review Dg Abd Acute W/chest  05/01/2013   CLINICAL DATA:  Severe abdominal pain this morning, history pancreatitis, hypertension, diabetes, GERD, asthma, coronary artery disease post MI, COPD  EXAM: ACUTE ABDOMEN SERIES (ABDOMEN 2 VIEW & CHEST 1 VIEW)  COMPARISON:  04/11/2013  Correlation: CT abdomen 02/06/2013  FINDINGS: Normal heart size, mediastinal contours, and pulmonary vascularity.  Bronchitic changes without infiltrate, pleural effusion or pneumothorax.  Bones demineralized.  Normal bowel gas pattern.  No bowel dilatation, bowel wall thickening, or free intraperitoneal air.  Evidence of prior bowel surgery in the left upper quadrant.  Catheter projects over the stomach and mid/distal esophagus ; this was coiled within the stomach on a prior CT.  Post laminectomies of L4 and L5 with Ray cage fusion of L4-L5.  Scattered pelvic phleboliths without urinary tract calcification.  IMPRESSION: No acute cardiopulmonary abnormalities.  Normal bowel gas pattern.  Catheter projects over the stomach and mid to distal esophagus ; prior CT suggests this is a gastrostomy tube, the tip of which is now within the esophagus rather than within the stomach, consider repositioning.   Electronically Signed   By: Lavonia Dana M.D.   On: 05/01/2013 10:08    MDM  Dignsosis: 1. Intractable Vomiting due to Gastroparesis 2. Hyperglycemia 3. J-tube malposition  Patient with a history of gastroparesis presents to the ER with severe abdominal pain and intractable vomiting consistent with previous gastroparesis episodes. Patient reports that symptoms began last night, half continued through the night and into this morning. Patient has a GJ tube that previously has been repositioned into the stomach.  Patient was initially treated with Tylenol for pain and Reglan. She continued vomiting and was given Phenergan. She continues to have significant retching and vomiting. These symptoms are consistent with her gastroparesis flares and  have required hospitalization in the past.  Patient  does have a slight anion gap at 21, bicarbonate is normal on the chemistry however. She might have a very slight DKA, but it is felt that the anion gap is from her vomiting. Hyponatremia is secondary to hyperglycemia, corrected sodium is 131.  X-rays that showed evidence of obstruction. Tip of the GJ tube is in the distal esophagus. I do not anticipate any need for interventional radiology repositioning. Once the patient stops retching and vomiting, the tip likely coiled in the stomach as it was previously positioned.  Case discussed with Doctor Karleen Hampshire. I was considering stabilizer, but Doctor Karleen Hampshire felt the patient could be put on a sliding scale and the patient will be placed in a medical bed.  Orpah Greek, MD 05/01/13 1100

## 2013-05-01 NOTE — ED Notes (Signed)
IV team at bedside 

## 2013-05-01 NOTE — Progress Notes (Signed)
INITIAL NUTRITION ASSESSMENT  Pt meets criteria for severe MALNUTRITION in the context of chronic illness as evidenced by <75% estimated energy intake in the past month with 9.2% weight loss in the past 5 months in addition to pt with visible severe muscle wasting and subcutaneous fat loss in thighs, knees, and calves.  DOCUMENTATION CODES Per approved criteria  -Severe malnutrition in the context of chronic illness   INTERVENTION: - Diet advancement per MD - TF initiation once J tube replaced, consult RD for enteral management - Will continue to monitor   NUTRITION DIAGNOSIS: Inadequate oral intake related to inability to eat as evidenced by NPO.    Goal: Resume TF as appropriate with goal to meet >90% of estimated nutritional needs   Monitor:  Weights, labs, diet advancement, J tube repositioning, TF initiation   Reason for Assessment: Nutrition risk, home TF  42 y.o. female  Admitting Dx: Intractable nausea and vomiting  ASSESSMENT: Pt well known to RD. Pt with history of nausea and vomiting. Has J tube which has been replaced numerous times in the past due to being malpositioned. This time pt reports tube came out of her mouth. Pt also in mild DKA on admission. Most recent d/c earlier this month pt was d/c on Osmolite 1.2 at 7ml/hr which provides 1728 calories, 80g, 1121ml free water, however upon discussing this with pt today, she states she was only running Osmolite 1.2 at 16ml/hr continuously at home which provided 1008 calories, 47g protein, 621ml free water meeting only 59% estimated calorie needs and 52% estimated protein needs. Pt denies eating/drinking by mouth. Pt's weight stable since admission earlier this month but down 14 pounds in the past 5 months. Noted pt with severe subcutaneous fat loss in thighs, knees, and calves.   Lab Results  Component Value Date   HGBA1C 8.9* 02/06/2013    Height: Ht Readings from Last 1 Encounters:  05/01/13 5\' 4"  (1.626 m)     Weight: Wt Readings from Last 1 Encounters:  05/01/13 138 lb (62.596 kg)    Ideal Body Weight: 120 lb   % Ideal Body Weight: 115%  Wt Readings from Last 10 Encounters:  05/01/13 138 lb (62.596 kg)  04/11/13 135 lb 2.3 oz (61.3 kg)  02/17/13 139 lb 1.8 oz (63.1 kg)  02/17/13 139 lb 1.8 oz (63.1 kg)  02/06/13 141 lb 1.5 oz (64 kg)  01/19/13 145 lb 4.5 oz (65.9 kg)  12/26/12 152 lb (68.947 kg)  12/26/12 152 lb (68.947 kg)  03/14/12 128 lb 1.4 oz (58.1 kg)  03/14/12 128 lb 1.4 oz (58.1 kg)    Usual Body Weight: 152 lb in May 2014  % Usual Body Weight: 91%  BMI:  Body mass index is 23.68 kg/(m^2).  Estimated Nutritional Needs: Kcal: 1700-1900  Protein: 90-100 grams  Fluid: 1.9-2.1 L/day  Skin: intact   Diet Order: NPO  EDUCATION NEEDS: -No education needs identified at this time  No intake or output data in the 24 hours ending 05/01/13 1450  Last BM: PTA  Labs:   Recent Labs Lab 05/01/13 0814 05/01/13 1245  NA 125* 126*  K 4.3 3.5  CL 83* 82*  CO2 21 25  BUN 14 15  CREATININE 0.56 0.55  CALCIUM 10.4 9.8  GLUCOSE 504* 471*    CBG (last 3)   Recent Labs  05/01/13 1100 05/01/13 1250  GLUCAP 439* 420*    Scheduled Meds: . amLODipine  10 mg Per Tube Daily  . busPIRone  5  mg Per Tube TID  . enoxaparin (LOVENOX) injection  40 mg Subcutaneous Q24H  . gabapentin  600 mg Per Tube TID  . insulin regular  0-10 Units Intravenous TID WC  . lisinopril  10 mg Oral Daily  . metoCLOPramide (REGLAN) injection  5 mg Intravenous Q6H  . mometasone-formoterol  2 puff Inhalation BID  . pantoprazole (PROTONIX) IV  40 mg Intravenous Q24H  . sucralfate  1 g Per Tube TID WC & HS    Continuous Infusions: . sodium chloride    . dextrose 5 % and 0.45% NaCl    . insulin (NOVOLIN-R) infusion 3.3 Units/hr (05/01/13 1412)    Past Medical History  Diagnosis Date  . Hypertension   . Diabetes mellitus   . Gastroparesis   . Asthma   . GERD (gastroesophageal  reflux disease)   . Coronary artery disease   . h/o seizure   . Neuropathy   . Chronic pancreatitis   . Dyslipidemia   . MI (myocardial infarction)   . COPD (chronic obstructive pulmonary disease)   . Seizures     Past Surgical History  Procedure Laterality Date  . Abdominal hysterectomy    . Cholecystectomy    . Peg tube x 4      feeding jejunostomies with PEG tubes  . Cesarean section    . Colon resection due to diverticulitis    . Back surgery    . Esophagogastroduodenoscopy N/A 12/27/2012    Procedure: ESOPHAGOGASTRODUODENOSCOPY (EGD);  Surgeon: Missy Sabins, MD;  Location: [facility];  Service: Endoscopy;  Laterality: N/A;  . Esophagogastroduodenoscopy (egd) with esophageal dilation N/A 02/19/2013    Procedure: ESOPHAGOGASTRODUODENOSCOPY (EGD) WITH ESOPHAGEAL DILATION;  Surgeon: Wonda Horner, MD;  Location: [facility];  Service: Endoscopy;  Laterality: N/A;  . Balloon dilation N/A 02/19/2013    Procedure: BALLOON DILATION;  Surgeon: Wonda Horner, MD;  Location: [facility];  Service: Endoscopy;  Laterality: N/A;    Mikey College MS, [place], [place] Pager [phone] After Hours Pager

## 2013-05-01 NOTE — Consult Note (Signed)
Referring Provider: Dr. Karleen Hampshire ([facility]) Primary Care Physician:  [name], MD Primary Gastroenterologist:  None (has seen various [facility] gastroenterologists in the hospital in the past, but has not been followed as an outpatient)  Reason for Consultation:  Nausea and vomiting  HPI: [name] is a 42 y.o. female who has been admitted to the hospital repeatedly with abdominal pain, nausea, and vomiting.   She has a remote history of a surgical jejunostomy, apparently placed in [place]. More recently, she has been receiving feedings through a PEJ feeding tube.  She has a history of poorly controlled diabetes and I see at least 1 hemoglobin A1c from the past having been 12.6.   She has a history of chronic pain, previously treated in [place] with narcotics and comes to [place] on oxycodone as an outpatient.   There is apparently a past history of chronic pancreatitis but there is no radiographic evidence from her CT scan several months ago of pancreatic abnormalities.   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 Upper endoscopy in July by Dr. Acquanetta Sit showed a large amount of retained fluid in the stomach and her PEJ tube coiled in the stomach.  At this time, almost all of the history is obtained from the patient and the referring physician, since the patient is moaning in bed and basically non-communicative.    Past Medical History  Diagnosis Date  . Hypertension   . Diabetes mellitus   . Gastroparesis   . Asthma   . GERD (gastroesophageal reflux disease)   . Coronary artery disease   . h/o seizure   . Neuropathy   . Chronic pancreatitis   . Dyslipidemia   . MI (myocardial infarction)   . COPD (chronic obstructive pulmonary disease)   . Seizures     Past Surgical History  Procedure Laterality Date  . Abdominal hysterectomy    .  Cholecystectomy    . Peg tube x 4      feeding jejunostomies with PEG tubes  . Cesarean section    . Colon resection due to diverticulitis    . Back surgery    . Esophagogastroduodenoscopy N/A 12/27/2012    Procedure: ESOPHAGOGASTRODUODENOSCOPY (EGD);  Surgeon: Missy Sabins, MD;  Location: [facility];  Service: Endoscopy;  Laterality: N/A;  . Esophagogastroduodenoscopy (egd) with esophageal dilation N/A 02/19/2013    Procedure: ESOPHAGOGASTRODUODENOSCOPY (EGD) WITH ESOPHAGEAL DILATION;  Surgeon: Wonda Horner, MD;  Location: [facility];  Service: Endoscopy;  Laterality: N/A;  . Balloon dilation N/A 02/19/2013    Procedure: BALLOON DILATION;  Surgeon: Wonda Horner, MD;  Location: [facility];  Service: Endoscopy;  Laterality: N/A;    Prior to Admission medications   Medication Sig Start Date End Date Taking? Authorizing Provider  amLODipine (NORVASC) 10 MG tablet Place 1 tablet (10 mg total) into feeding tube daily. 02/20/13  Yes Robbie Lis, MD  atorvastatin (LIPITOR) 40 MG tablet Place 1 tablet (40 mg total) into feeding tube daily at 6 PM. 02/20/13  Yes Robbie Lis, MD  bisacodyl (DULCOLAX) 10 MG suppository Place 1 suppository (10 mg total) rectally daily. 02/20/13  Yes Robbie Lis, MD  busPIRone (BUSPAR) 5 MG tablet Place 5 mg into feeding tube 3 (three) times daily.   Yes Historical Provider, MD  Fluticasone-Salmeterol (ADVAIR) 250-50 MCG/DOSE AEPB Inhale 1 puff into the lungs 2 (two)  times daily. 12/02/11  Yes Robbie Lis, MD  gabapentin (NEURONTIN) 300 MG capsule Place 2 capsules (600 mg total) into feeding tube 3 (three) times daily. 02/20/13  Yes Robbie Lis, MD  insulin lispro (HUMALOG) 100 UNIT/ML injection Sliding scale 0-150= 0 units 151-200= 2 units 201-250= 4 units 251-300= 6 units 301-350= 8 units 351-400= 10 units >400 = 911 03/29/13  Yes Historical Provider, MD  lisinopril (PRINIVIL,ZESTRIL) 10 MG tablet 10 mg. 1 tablet (10 mg total) by Per J Tube route daily. 03/29/13   Yes Historical Provider, MD  Multiple Vitamin (MULTIVITAMIN WITH MINERALS) TABS Place 1 tablet into feeding tube daily. 02/20/13  Yes Robbie Lis, MD  Nutritional Supplements (FEEDING SUPPLEMENT, OSMOLITE 1.2 CAL,) LIQD Place 1,000 mLs into feeding tube continuous. Rate:46ml/hr   Yes Historical Provider, MD  oxyCODONE (OXY IR/ROXICODONE) 5 MG immediate release tablet Place 1-2 tablets (5-10 mg total) into feeding tube every 6 (six) hours as needed. 04/16/13  Yes Erline Hau, MD  pantoprazole (PROTONIX) 40 MG tablet Take 40 mg by mouth daily. 03/29/13  Yes Historical Provider, MD  potassium chloride (KLOR-CON) 8 MEQ tablet 8 mEq. Take 1 tablet (8 mEq total) by mouth daily. 03/29/13 03/29/14 Yes Historical Provider, MD  promethazine (PHENERGAN) 12.5 MG suppository Place 1 suppository (12.5 mg total) rectally every 6 (six) hours as needed for nausea. 04/16/13  Yes Estela Leonie Green, MD  sucralfate (CARAFATE) 1 GM/10ML suspension Place 10 mLs (1 g total) into feeding tube 4 (four) times daily -  with meals and at bedtime. 02/20/13  Yes Robbie Lis, MD  torsemide (DEMADEX) 10 MG tablet Take 10 mg by mouth daily.   Yes Historical Provider, MD  zolpidem (AMBIEN) 10 MG tablet Take 1 tablet (10 mg total) by mouth at bedtime as needed for sleep. 02/20/13  Yes Robbie Lis, MD    Current Facility-Administered Medications  Medication Dose Route Frequency Provider Last Rate Last Dose  . 0.9 %  sodium chloride infusion   Intravenous Continuous Hosie Poisson, MD      . amLODipine (NORVASC) tablet 10 mg  10 mg Per Tube Daily Hosie Poisson, MD      . busPIRone (BUSPAR) tablet 5 mg  5 mg Per Tube TID Hosie Poisson, MD      . dextrose 5 %-0.45 % sodium chloride infusion   Intravenous Continuous Hosie Poisson, MD      . dextrose 50 % solution 25 mL  25 mL Intravenous PRN Hosie Poisson, MD      . enoxaparin (LOVENOX) injection 40 mg  40 mg Subcutaneous Q24H Hosie Poisson, MD      . gabapentin (NEURONTIN)  capsule 600 mg  600 mg Per Tube TID Hosie Poisson, MD      . hydrALAZINE (APRESOLINE) injection 5 mg  5 mg Intravenous Q6H PRN Hosie Poisson, MD   5 mg at 05/01/13 1155  . HYDROmorphone (DILAUDID) injection 1 mg  1 mg Intravenous Q3H PRN Hosie Poisson, MD   1 mg at 05/01/13 1323  . insulin regular (NOVOLIN R,HUMULIN R) 1 Units/mL in sodium chloride 0.9 % 100 mL infusion   Intravenous Continuous Hosie Poisson, MD 3.3 mL/hr at 05/01/13 1412 3.3 Units/hr at 05/01/13 1412  . insulin regular bolus via infusion 0-10 Units  0-10 Units Intravenous TID WC Hosie Poisson, MD      . labetalol (NORMODYNE,TRANDATE) injection 10 mg  10 mg Intravenous Q3H PRN Hosie Poisson, MD   10 mg at  05/01/13 1409  . lisinopril (PRINIVIL,ZESTRIL) tablet 10 mg  10 mg Oral Daily Hosie Poisson, MD      . metoCLOPramide (REGLAN) injection 5 mg  5 mg Intravenous Q6H Hosie Poisson, MD   5 mg at 05/01/13 1140  . mometasone-formoterol (DULERA) 100-5 MCG/ACT inhaler 2 puff  2 puff Inhalation BID Hosie Poisson, MD      . oxyCODONE (Oxy IR/ROXICODONE) immediate release tablet 5-10 mg  5-10 mg Per Tube Q6H PRN Hosie Poisson, MD      . pantoprazole (PROTONIX) injection 40 mg  40 mg Intravenous Q24H Hosie Poisson, MD      . promethazine (PHENERGAN) injection 12.5-25 mg  12.5-25 mg Intravenous Q4H PRN Hosie Poisson, MD   25 mg at 05/01/13 1323  . promethazine (PHENERGAN) suppository 12.5 mg  12.5 mg Rectal Q6H PRN Hosie Poisson, MD      . sucralfate (CARAFATE) 1 GM/10ML suspension 1 g  1 g Per Tube TID WC & HS Hosie Poisson, MD      . zolpidem (AMBIEN) tablet 10 mg  10 mg Oral QHS PRN Hosie Poisson, MD        Allergies as of 05/01/2013 - Review Complete 05/01/2013  Allergen Reaction Noted  . Penicillins Hives 01/13/2011  . Cefadroxil  12/26/2012  . Compazine Hives 01/13/2011  . Darvocet [propoxyphene-acetaminophen] Other (See Comments) 01/13/2011  . Nsaids Hives 01/13/2011  . Zofran [ondansetron] Hives 12/26/2012    Family History  Problem  Relation Age of Onset  . Cancer Mother     History   Social History  . Marital Status: Divorced    Spouse Name: N/A    Number of Children: N/A  . Years of Education: N/A   Occupational History  . unemployed    Social History Main Topics  . Smoking status: Former Smoker -- 0.25 packs/day for 2 years    Types: Cigarettes  . Smokeless tobacco: Never Used  . Alcohol Use: No     Comment: hx 1 beer daily -pt states she quit Dec. 2012  . Drug Use: No  . Sexual Activity: No   Other Topics Concern  . Not on file   Social History Narrative  . No narrative on file    Review of Systems: Unobtainable at present in view of the patient's condition.  Physical Exam: Vital signs in last 24 hours: Temp:  [97.3 F (36.3 C)-98.7 F (37.1 C)] 98.7 F (37.1 C) (10/01 1400) Pulse Rate:  [121-134] 126 (10/01 1400) Resp:  [17-26] 17 (10/01 1400) BP: (178-200)/(102-149) 185/123 mmHg (10/01 1400) SpO2:  [97 %-100 %] 100 % (10/01 1400) Weight:  [62.596 kg (138 lb)] 62.596 kg (138 lb) (10/01 1400)   General:   Alert,  Well-developed, well-nourished, somewhat obese lying quietly in bed until her abdomen is and examined, then a lot of moaning. Head:  Normocephalic and atraumatic. Lungs:  Clear throughout to auscultation.   No wheezes, crackles, or rhonchi. No evident respiratory distress. Heart:   Regular rate and rhythm; no murmurs, clicks, rubs,  or gallops. Abdomen:  Prominent surgical scars. Nondistended. Bowel sounds quiet. Marked subjective discomfort with a lot of moaning with even mild to moderate palpation. No discrete mass effect, no peritoneal findings. The patient's reaction appear somewhat non-physiologic. Msk:   Symmetrical without gross deformities. Neurologic: Grossly normal neurologically. Psych:   As noted above, withdrawn, anxious, noncooperative  Intake/Output from previous day:   Intake/Output this shift:    Lab Results:  Recent Labs  05/01/13 0908  WBC 13.1*   HGB 14.1  HCT 39.4  PLT 283   BMET  Recent Labs  05/01/13 0814 05/01/13 1245  NA 125* 126*  K 4.3 3.5  CL 83* 82*  CO2 21 25  GLUCOSE 504* 471*  BUN 14 15  CREATININE 0.56 0.55  CALCIUM 10.4 9.8   LFT  Recent Labs  05/01/13 0814  PROT 8.6*  ALBUMIN 3.5  AST 31  ALT 23  ALKPHOS 159*  BILITOT 0.3   PT/INR No results found for this basename: LABPROT, INR,  in the last 72 hours  Studies/Results: Dg Abd Acute W/chest  05/01/2013   CLINICAL DATA:  Severe abdominal pain this morning, history pancreatitis, hypertension, diabetes, GERD, asthma, coronary artery disease post MI, COPD  EXAM: ACUTE ABDOMEN SERIES (ABDOMEN 2 VIEW & CHEST 1 VIEW)  COMPARISON:  04/11/2013  Correlation: CT abdomen 02/06/2013  FINDINGS: Normal heart size, mediastinal contours, and pulmonary vascularity.  Bronchitic changes without infiltrate, pleural effusion or pneumothorax.  Bones demineralized.  Normal bowel gas pattern.  No bowel dilatation, bowel wall thickening, or free intraperitoneal air.  Evidence of prior bowel surgery in the left upper quadrant.  Catheter projects over the stomach and mid/distal esophagus ; this was coiled within the stomach on a prior CT.  Post laminectomies of L4 and L5 with Ray cage fusion of L4-L5.  Scattered pelvic phleboliths without urinary tract calcification.  IMPRESSION: No acute cardiopulmonary abnormalities.  Normal bowel gas pattern.  Catheter projects over the stomach and mid to distal esophagus ; prior CT suggests this is a gastrostomy tube, the tip of which is now within the esophagus rather than within the stomach, consider repositioning.   Electronically Signed   By: Lavonia Dana M.D.   On: 05/01/2013 10:08    Impression: 1. Recurrent nausea, vomiting, and abdominal pain 2. Etiology of pain is unclear. 3. Nausea and vomiting may be related to gastroparesis from diabetic autonomic neuropathy, impairment of gastric motility from narcotic exposure, and/or gastric  dysfunction related to poor glycemic control. 4. Apparent malpositioning of the feeding tube, now apparently in the esophagus  Plan: 1. Agree with plan to try to correct position of PEJ tube 2. This patient would probably benefit from surgical consultation for consideration of another surgical jejunal feeding tube 3. I would consider EUS to look for evidence of chronic pancreatitis. If absent, I would strongly consider trying to get this patient of chronic narcotics, which are probably contributing to a lot of her problems including her impaired gastric emptying. If there is evidence for chronic pancreatitis, referral to a tertiary care center for alternative forms of management may be worthwhile. 4. This patient might need some sort of home case management since it is clear that her glycemic control is very poor.   LOS: 0 days   Lashun Ramseyer V  05/01/2013, 2:31 PM

## 2013-05-02 DIAGNOSIS — K3184 Gastroparesis: Secondary | ICD-10-CM

## 2013-05-02 DIAGNOSIS — Z431 Encounter for attention to gastrostomy: Secondary | ICD-10-CM

## 2013-05-02 LAB — URINE MICROSCOPIC-ADD ON

## 2013-05-02 LAB — BASIC METABOLIC PANEL
BUN: 21 mg/dL (ref 6–23)
BUN: 21 mg/dL (ref 6–23)
CO2: 25 mEq/L (ref 19–32)
CO2: 21 mEq/L (ref 19–32)
Calcium: 8.9 mg/dL (ref 8.4–10.5)
Calcium: 8.8 mg/dL (ref 8.4–10.5)
Chloride: 94 mEq/L — ABNORMAL LOW (ref 96–112)
Creatinine, Ser: 1.02 mg/dL (ref 0.50–1.10)
Creatinine, Ser: 0.87 mg/dL (ref 0.50–1.10)
GFR calc Af Amer: 77 mL/min — ABNORMAL LOW (ref >90)
GFR calc Af Amer: >90 mL/min (ref >90)
GFR calc non Af Amer: 66 mL/min — ABNORMAL LOW (ref >90)
GFR calc non Af Amer: 80 mL/min — ABNORMAL LOW (ref >90)
Glucose, Bld: 174 mg/dL — ABNORMAL HIGH (ref 70–99)
Potassium: 2.8 mEq/L — ABNORMAL LOW (ref 3.5–5.1)
Potassium: 3.9 mEq/L (ref 3.5–5.1)
Sodium: 129 mEq/L — ABNORMAL LOW (ref 135–145)
Sodium: 128 mEq/L — ABNORMAL LOW (ref 135–145)

## 2013-05-02 LAB — BASIC METABOLIC PANEL WITH GFR
BUN: 19 mg/dL (ref 6–23)
CO2: 25 meq/L (ref 19–32)
Calcium: 8.4 mg/dL (ref 8.4–10.5)
Chloride: 92 meq/L — ABNORMAL LOW (ref 96–112)
Creatinine, Ser: 0.84 mg/dL (ref 0.50–1.10)
GFR calc Af Amer: >90 mL/min
GFR calc non Af Amer: 84 mL/min — ABNORMAL LOW
Glucose, Bld: 144 mg/dL — ABNORMAL HIGH (ref 70–99)
Potassium: 3.6 meq/L (ref 3.5–5.1)
Sodium: 130 meq/L — ABNORMAL LOW (ref 135–145)

## 2013-05-02 LAB — CBC
HCT: 34.7 % — ABNORMAL LOW (ref 36.0–46.0)
Hemoglobin: 12.2 g/dL (ref 12.0–15.0)
MCH: 29.8 pg (ref 26.0–34.0)
MCHC: 35.2 g/dL (ref 30.0–36.0)
Platelets: 208 10*3/uL (ref 150–400)
RDW: 14.8 % (ref 11.5–15.5)

## 2013-05-02 LAB — URINALYSIS, ROUTINE W REFLEX MICROSCOPIC
Bilirubin Urine: NEGATIVE
Glucose, UA: >1000 mg/dL — AB
Ketones, ur: NEGATIVE mg/dL
Leukocytes, UA: NEGATIVE
Nitrite: NEGATIVE
Specific Gravity, Urine: 1.033 — ABNORMAL HIGH (ref 1.005–1.030)
pH: 5 (ref 5.0–8.0)

## 2013-05-02 LAB — GLUCOSE, CAPILLARY
Glucose-Capillary: 136 mg/dL — ABNORMAL HIGH (ref 70–99)
Glucose-Capillary: 153 mg/dL — ABNORMAL HIGH (ref 70–99)
Glucose-Capillary: 154 mg/dL — ABNORMAL HIGH (ref 70–99)
Glucose-Capillary: 158 mg/dL — ABNORMAL HIGH (ref 70–99)
Glucose-Capillary: 159 mg/dL — ABNORMAL HIGH (ref 70–99)
Glucose-Capillary: 166 mg/dL — ABNORMAL HIGH (ref 70–99)
Glucose-Capillary: 168 mg/dL — ABNORMAL HIGH (ref 70–99)
Glucose-Capillary: 182 mg/dL — ABNORMAL HIGH (ref 70–99)
Glucose-Capillary: 183 mg/dL — ABNORMAL HIGH (ref 70–99)

## 2013-05-02 LAB — MAGNESIUM: Magnesium: 1.4 mg/dL — ABNORMAL LOW (ref 1.5–2.5)

## 2013-05-02 MED ORDER — INSULIN ASPART 100 UNIT/ML ~~LOC~~ SOLN: 3.0000 [IU] | Freq: Three times a day (TID) | SUBCUTANEOUS | Status: DC

## 2013-05-02 MED ORDER — INSULIN GLARGINE 100 UNIT/ML ~~LOC~~ SOLN
15.0000 [IU] | Freq: Every day | SUBCUTANEOUS | Status: DC
  Administered 2013-05-02 – 2013-05-07 (×6): 15 [IU] via SUBCUTANEOUS
  Filled 2013-05-02 (×7): qty 0.15

## 2013-05-02 MED ORDER — INSULIN ASPART 100 UNIT/ML ~~LOC~~ SOLN
0.0000 [IU] | SUBCUTANEOUS | Status: DC
  Administered 2013-05-02 – 2013-05-03 (×3): 2 [IU] via SUBCUTANEOUS
  Administered 2013-05-03: 1 [IU] via SUBCUTANEOUS
  Administered 2013-05-03: 2 [IU] via SUBCUTANEOUS
  Administered 2013-05-03 (×2): 1 [IU] via SUBCUTANEOUS
  Administered 2013-05-04 (×3): 2 [IU] via SUBCUTANEOUS
  Administered 2013-05-04: 3 [IU] via SUBCUTANEOUS
  Administered 2013-05-05 (×2): 1 [IU] via SUBCUTANEOUS
  Administered 2013-05-05: 2 [IU] via SUBCUTANEOUS
  Administered 2013-05-05: 05:00:00 1 [IU] via SUBCUTANEOUS
  Administered 2013-05-05: 2 [IU] via SUBCUTANEOUS
  Administered 2013-05-05 (×2): 1 [IU] via SUBCUTANEOUS

## 2013-05-02 MED ORDER — INSULIN ASPART 100 UNIT/ML ~~LOC~~ SOLN
0.0000 [IU] | SUBCUTANEOUS | Status: DC
  Administered 2013-05-02: 1 [IU] via SUBCUTANEOUS
  Administered 2013-05-02: 2 [IU] via SUBCUTANEOUS

## 2013-05-02 MED ORDER — MAGNESIUM SULFATE 40 MG/ML IJ SOLN
2.0000 g | Freq: Once | INTRAMUSCULAR | Status: AC
  Administered 2013-05-02: 2 g via INTRAVENOUS
  Filled 2013-05-02: qty 50

## 2013-05-02 MED ORDER — POTASSIUM CHLORIDE 10 MEQ/100ML IV SOLN
10.0000 meq | INTRAVENOUS | Status: AC
  Administered 2013-05-02 (×3): 10 meq via INTRAVENOUS
  Filled 2013-05-02 (×3): qty 100

## 2013-05-02 NOTE — Progress Notes (Signed)
[facility] PROGRESS NOTE  [name] [id number] DOB: 1971-02-11 DOA: 05/01/2013 PCP: [name], MD HPI: [name] is a 42 y.o. female with with h/o DM, recently discharged from the hospital after being treated for similar complaints, comes back for intractable nausea and vomiting and reports the g tube came out of her mouth during an episode of emesis, but was able to swallow it back. abd film showed g tube in the esophagus. Her symptoms improved transiently in the ED. She was also found to be in mild DKA on admission. She was admitted to step down and started her on glucostabilizer. IR consulted for PEJ tube repositioning. Gi consulted for further management. Recommended surgical consultation to put another PEJ tube. Surgery consulted and awaiting possible EUS in the next 24 hours. She remains NPO with IV fluids.   Assessment/Plan: 1. Intractable Nausea and vomiting: improved after IR put a G TUBE . Recommended endoscopic placement vs surgical placement of another PEJ tube.  - resume IV anti emetics, IV fluids and pain control as needed.  - if EUS is not scheduled we will go ahead and start her on clear liquids.  - lipase levels within normal limits.  - liver function tests within normal limits.  - appreciate GI input  2. Mild DKA: Resolved.  - started her on sq lantus and SSI - HGBA1C is 10.2.  3. Leukocytosis: unclear etiology, chest film did not show any cardiopulmonary abnormalities. Will get UA. She is currently afebrile and non toxic looking.   4. Hyponatremia: possibly from emesis. Continue to monitor.   5. Hypokalemia and hypomagnesemia: is being repleted as needed.   6. DVT prophylaxis.   Code Status: full code Family Communication: none atbedside Disposition Plan: pending.    Consultants:  GI  IR  SURGERY  Procedures:  G TUBE PLACEMENT.   Antibiotics:  NONE  HPI/Subjective: Reports abd pain , same as yesterday.   Objective: Filed  Vitals:   05/02/13 1000  BP:   Pulse: 102  Temp:   Resp: 19    Intake/Output Summary (Last 24 hours) at 05/02/13 1218 Last data filed at 05/02/13 1100  Gross per 24 hour  Intake 2912.85 ml  Output    770 ml  Net 2142.85 ml   Filed Weights   05/01/13 1400  Weight: 62.596 kg (138 lb)    Exam:   General:  Afebrile comfortable, sleeping  Cardiovascular: s1s2   Respiratory: ctab  Abdomen: soft Mildly tender, non distended   Musculoskeletal: no pedal edema.   Data Reviewed: Basic Metabolic Panel:  Recent Labs Lab 05/01/13 0814 05/01/13 1245 05/01/13 1958 05/02/13 0340 05/02/13 0852  NA 125* 126* 136 129* 128*  K 4.3 3.5 3.3* 2.8* 3.9  CL 83* 82* 90* 91* 94*  CO2 21 25 30 25 21   GLUCOSE 504* 471* 143* 167* 174*  BUN 14 15 19 21 21   CREATININE 0.56 0.55 1.00 1.02 0.87  CALCIUM 10.4 9.8 9.6 8.9 8.8  MG  --   --   --   --  1.4*   Liver Function Tests:  Recent Labs Lab 05/01/13 0814  AST 31  ALT 23  ALKPHOS 159*  BILITOT 0.3  PROT 8.6*  ALBUMIN 3.5    Recent Labs Lab 05/01/13 0814  LIPASE 37  AMYLASE 78   No results found for this basename: AMMONIA,  in the last 168 hours CBC:  Recent Labs Lab 05/01/13 0908 05/02/13 0340  WBC 13.1* 13.7*  NEUTROABS 10.9*  --  HGB 14.1 12.2  HCT 39.4 34.7*  MCV 84.2 84.6  PLT 283 208   Cardiac Enzymes:  Recent Labs Lab 05/01/13 0814  TROPONINI <0.30   BNP (last 3 results) No results found for this basename: PROBNP,  in the last 8760 hours CBG:  Recent Labs Lab 05/02/13 0355 05/02/13 0458 05/02/13 0602 05/02/13 0708 05/02/13 0813  GLUCAP 182* 139* 155* 168* 158*    Recent Results (from the past 240 hour(s))  MRSA PCR SCREENING     Status: None   Collection Time    05/01/13  2:07 PM      Result Value Range Status   MRSA by PCR NEGATIVE  NEGATIVE Final   Comment:            The GeneXpert MRSA Assay (FDA     approved for NASAL specimens     only), is one component of a      comprehensive MRSA colonization     surveillance program. It is not     intended to diagnose MRSA     infection nor to guide or     monitor treatment for     MRSA infections.     Performed at [facility]     Studies: [place] Gastro/colonic Tube Percut W/fluoro  05/01/2013   CLINICAL DATA:  Malpositioned GJ tube. Jejunal tube has refluxed into the esophagus.  EXAM: GASTROSTOMY CATHETER REPLACEMENT  Physician: Stephan Minister. Anselm Pancoast, MD  MEDICATIONS: None  ANESTHESIA/SEDATION: Moderate sedation time: None  FLUOROSCOPY TIME:  10 min and 18 seconds  PROCEDURE: The anterior abdomen was prepped and draped in a sterile fashion. Maximal barrier sterile technique was utilized including caps, mask, sterile gowns, sterile gloves, sterile drape, hand hygiene and skin antiseptic. The existing tube was completely removed. A C2 catheter and Kumpe catheter were advanced into the stomach. Air was injected in the stomach to identify the distal aspect of the stomach and possibly the small bowel. Neither a catheter or wire could be advanced outside of the stomach. As a result, an 3 French balloon retention gastrostomy tube was placed. Balloon was inflated with 8 mL of saline.  COMPLICATIONS: None  FINDINGS: Unable to cannulate the distal stomach or small bowel. As result, a GJ tube could not be placed. A 18 French balloon retention gastrostomy tube was placed.  IMPRESSION: Unsuccessful attempt at replacing the [place] tube. A new gastrostomy tube was placed. Consider catheter placement with endoscopy.   Electronically Signed   By: Markus Daft M.D.   On: 05/01/2013 17:01   Dg Abd Acute W/chest  05/01/2013   CLINICAL DATA:  Severe abdominal pain this morning, history pancreatitis, hypertension, diabetes, GERD, asthma, coronary artery disease post MI, COPD  EXAM: ACUTE ABDOMEN SERIES (ABDOMEN 2 VIEW & CHEST 1 VIEW)  COMPARISON:  04/11/2013  Correlation: CT abdomen 02/06/2013  FINDINGS: Normal heart size, mediastinal contours, and  pulmonary vascularity.  Bronchitic changes without infiltrate, pleural effusion or pneumothorax.  Bones demineralized.  Normal bowel gas pattern.  No bowel dilatation, bowel wall thickening, or free intraperitoneal air.  Evidence of prior bowel surgery in the left upper quadrant.  Catheter projects over the stomach and mid/distal esophagus ; this was coiled within the stomach on a prior CT.  Post laminectomies of L4 and L5 with Ray cage fusion of L4-L5.  Scattered pelvic phleboliths without urinary tract calcification.  IMPRESSION: No acute cardiopulmonary abnormalities.  Normal bowel gas pattern.  Catheter projects over the stomach and mid to distal  esophagus ; prior CT suggests this is a gastrostomy tube, the tip of which is now within the esophagus rather than within the stomach, consider repositioning.   Electronically Signed   By: Lavonia Dana M.D.   On: 05/01/2013 10:08    Scheduled Meds: . amLODipine  10 mg Per Tube Daily  . busPIRone  5 mg Per Tube TID  . enoxaparin (LOVENOX) injection  40 mg Subcutaneous Q24H  . gabapentin  600 mg Per Tube TID  . insulin aspart  0-9 Units Subcutaneous Q4H  . insulin aspart  3 Units Subcutaneous TID WC  . insulin glargine  15 Units Subcutaneous Daily  . metoCLOPramide (REGLAN) injection  5 mg Intravenous Q6H  . mometasone-formoterol  2 puff Inhalation BID  . pantoprazole (PROTONIX) IV  40 mg Intravenous Q24H  . sucralfate  1 g Per Tube TID WC & HS   Continuous Infusions: . sodium chloride Stopped (05/01/13 1930)  . dextrose 5 % and 0.45% NaCl 125 mL/hr (05/01/13 1940)    Active Problems:   Protein-calorie malnutrition, severe    Time spent: 35 min    [facility] Pager [phone], please contact night-coverage at [email or web address], password [facility] 05/02/2013, 12:18 PM  LOS: 1 day

## 2013-05-02 NOTE — Consult Note (Signed)
Reason for Consult:  Intractable nausea, vomiting, dysphasia, possible achalasia on swallowing study 02/18/13 Referring Physician: Dr. Marko Plume is an 42 y.o. female.  HPI: 42 y/o female who has resided multiple places.  She has a history;  by her account of 4 prior feeding tubes.  Prior to that she has what sounds like small bowel resection in the past which is what she attributes her midline incision to.  She thinks it was done in [place]. I have found [facility] records going back to 03/07/12 where IR place a 20 Fr gastrostomy tube, a second attempt on 03/09/12 the jejunal limb was properly placed.  It was repositioned on 02/07/13, and the was replace on 02/20/13 again in IR.  On 10/1 /14 patient was readmitted with nausea/vomiting, and had displace the feeding tube again.  IR attempted to reposition the tube again but they could not advance wire or catheter outside the stomach.  A new Gastrotomy tube was placed  By IR at that time.  She has been seen by Dr. Cristina Gong, and it was his opinion a GASTRIC Jejunostomy tube was the best plan, and recommended possible surgical jejunal feeding tube.  We are ask to see for surgical evaluation.  Past Medical History  Diagnosis Date  . Hypertension   . Diabetes mellitus    Chronic narcotic use for abdominal pain   . Gastroparesis Dysphagia/achalasia   . Asthma   . GERD (gastroesophageal reflux disease)   . Coronary artery disease   . h/o seizure   . Neuropathy   . Chronic pancreatitis   . Dyslipidemia   . MI (myocardial infarction)   . COPD (chronic obstructive pulmonary disease)   . Seizures     Past Surgical History  Procedure Laterality Date  . Abdominal hysterectomy    . Cholecystectomy    . Peg tube x 4      feeding jejunostomies with PEG tubes  . Cesarean section    . Colon resection due to diverticulitis    . Back surgery    . Esophagogastroduodenoscopy N/A 12/27/2012    Procedure: ESOPHAGOGASTRODUODENOSCOPY (EGD);  Surgeon: Missy Sabins, MD;  Location: [facility];  Service: Endoscopy;  Laterality: N/A;  . Esophagogastroduodenoscopy (egd) with esophageal dilation N/A 02/19/2013    Procedure: ESOPHAGOGASTRODUODENOSCOPY (EGD) WITH ESOPHAGEAL DILATION;  Surgeon: Wonda Horner, MD;  Location: [facility];  Service: Endoscopy;  Laterality: N/A;  . Balloon dilation N/A 02/19/2013    Procedure: BALLOON DILATION;  Surgeon: Wonda Horner, MD;  Location: [facility];  Service: Endoscopy;  Laterality: N/A;    Family History  Problem Relation Age of Onset  . Cancer Mother     Social History:  reports that she has quit smoking. Her smoking use included Cigarettes. She has a .5 pack-year smoking history. She has never used smokeless tobacco. She reports that she does not drink alcohol or use illicit drugs.  Allergies:  Allergies  Allergen Reactions  . Penicillins Hives  . Cefadroxil     Not sure of effects-listed as allergy on list from home  . Compazine Hives    Tolerates promethazine  . Darvocet [Propoxyphene-Acetaminophen] Other (See Comments)    UNKNOWN  . Nsaids Hives  . Zofran [Ondansetron] Hives    Medications:  Prior to Admission:  Prescriptions prior to admission  Medication Sig Dispense Refill  . amLODipine (NORVASC) 10 MG tablet Place 1 tablet (10 mg total) into feeding tube daily.  30 tablet  2  . atorvastatin (  LIPITOR) 40 MG tablet Place 1 tablet (40 mg total) into feeding tube daily at 6 PM.  30 tablet  0  . bisacodyl (DULCOLAX) 10 MG suppository Place 1 suppository (10 mg total) rectally daily.  12 suppository  0  . busPIRone (BUSPAR) 5 MG tablet Place 5 mg into feeding tube 3 (three) times daily.      . Fluticasone-Salmeterol (ADVAIR) 250-50 MCG/DOSE AEPB Inhale 1 puff into the lungs 2 (two) times daily.  60 each  0  . gabapentin (NEURONTIN) 300 MG capsule Place 2 capsules (600 mg total) into feeding tube 3 (three) times daily.  30 capsule  0  . insulin lispro (HUMALOG) 100 UNIT/ML injection Sliding  scale 0-150= 0 units 151-200= 2 units 201-250= 4 units 251-300= 6 units 301-350= 8 units 351-400= 10 units >400 = 911      . lisinopril (PRINIVIL,ZESTRIL) 10 MG tablet 10 mg. 1 tablet (10 mg total) by Per J Tube route daily.      . Multiple Vitamin (MULTIVITAMIN WITH MINERALS) TABS Place 1 tablet into feeding tube daily.  30 tablet  0  . Nutritional Supplements (FEEDING SUPPLEMENT, OSMOLITE 1.2 CAL,) LIQD Place 1,000 mLs into feeding tube continuous. Rate:68ml/hr      . oxyCODONE (OXY IR/ROXICODONE) 5 MG immediate release tablet Place 1-2 tablets (5-10 mg total) into feeding tube every 6 (six) hours as needed.  30 tablet  0  . pantoprazole (PROTONIX) 40 MG tablet Take 40 mg by mouth daily.      . potassium chloride (KLOR-CON) 8 MEQ tablet 8 mEq. Take 1 tablet (8 mEq total) by mouth daily.      . promethazine (PHENERGAN) 12.5 MG suppository Place 1 suppository (12.5 mg total) rectally every 6 (six) hours as needed for nausea.  30 each  0  . sucralfate (CARAFATE) 1 GM/10ML suspension Place 10 mLs (1 g total) into feeding tube 4 (four) times daily -  with meals and at bedtime.  420 mL  0  . torsemide (DEMADEX) 10 MG tablet Take 10 mg by mouth daily.      Marland Kitchen zolpidem (AMBIEN) 10 MG tablet Take 1 tablet (10 mg total) by mouth at bedtime as needed for sleep.  30 tablet  0   Scheduled: . enoxaparin (LOVENOX) injection  40 mg Subcutaneous Q24H  . insulin aspart  0-9 Units Subcutaneous Q4H  . insulin aspart  3 Units Subcutaneous TID WC  . insulin glargine  15 Units Subcutaneous Daily  . magnesium sulfate 1 - 4 g bolus IVPB  2 g Intravenous Once  . metoCLOPramide (REGLAN) injection  5 mg Intravenous Q6H  . mometasone-formoterol  2 puff Inhalation BID  . pantoprazole (PROTONIX) IV  40 mg Intravenous Q24H   Continuous: . sodium chloride Stopped (05/01/13 1930)  . dextrose 5 % and 0.45% NaCl 125 mL/hr (05/01/13 1940)   [id number], hydrALAZINE, HYDROmorphone (DILAUDID) injection, labetalol,  promethazine, promethazine Anti-infectives   None      Results for orders placed during the hospital encounter of 05/01/13 (from the past 48 hour(s))  COMPREHENSIVE METABOLIC PANEL     Status: Abnormal   Collection Time    05/01/13  8:14 AM      Result Value Range   Sodium 125 (*) 135 - 145 mEq/L   Potassium 4.3  3.5 - 5.1 mEq/L   Comment: SLIGHT HEMOLYSIS   Chloride 83 (*) 96 - 112 mEq/L   CO2 21  19 - 32 mEq/L   Glucose, Bld 504 (*) 70 -  99 mg/dL   BUN 14  6 - 23 mg/dL   Creatinine, Ser 0.56  0.50 - 1.10 mg/dL   Calcium 10.4  8.4 - 10.5 mg/dL   Total Protein 8.6 (*) 6.0 - 8.3 g/dL   Albumin 3.5  3.5 - 5.2 g/dL   AST 31  0 - 37 U/L   ALT 23  0 - 35 U/L   Alkaline Phosphatase 159 (*) 39 - 117 U/L   Total Bilirubin 0.3  0.3 - 1.2 mg/dL   GFR calc non Af Amer >90  >90 mL/min   GFR calc Af Amer >90  >90 mL/min   Comment: (NOTE)     The eGFR has been calculated using the CKD EPI equation.     This calculation has not been validated in all clinical situations.     eGFR's persistently <90 mL/min signify possible Chronic Kidney     Disease.  TROPONIN I     Status: None   Collection Time    05/01/13  8:14 AM      Result Value Range   Troponin I <0.30  <0.30 ng/mL   Comment:            Due to the release kinetics of cTnI,     a negative result within the first hours     of the onset of symptoms does not rule out     myocardial infarction with certainty.     If myocardial infarction is still suspected,     repeat the test at appropriate intervals.  LIPASE, BLOOD     Status: None   Collection Time    05/01/13  8:14 AM      Result Value Range   Lipase 37  11 - 59 U/L  AMYLASE     Status: None   Collection Time    05/01/13  8:14 AM      Result Value Range   Amylase 78  0 - 105 U/L  CBC WITH DIFFERENTIAL     Status: Abnormal   Collection Time    05/01/13  9:08 AM      Result Value Range   WBC 13.1 (*) 4.0 - 10.5 K/uL   RBC 4.68  3.87 - 5.11 MIL/uL   Hemoglobin 14.1   12.0 - 15.0 g/dL   HCT 39.4  36.0 - 46.0 %   MCV 84.2  78.0 - 100.0 fL   MCH 30.1  26.0 - 34.0 pg   MCHC 35.8  30.0 - 36.0 g/dL   RDW 14.5  11.5 - 15.5 %   Platelets 283  150 - 400 K/uL   Neutrophils Relative % 84 (*) 43 - 77 %   Neutro Abs 10.9 (*) 1.7 - 7.7 K/uL   Lymphocytes Relative 13  12 - 46 %   Lymphs Abs 1.7  0.7 - 4.0 K/uL   Monocytes Relative 3  3 - 12 %   Monocytes Absolute 0.4  0.1 - 1.0 K/uL   Eosinophils Relative 0  0 - 5 %   Eosinophils Absolute 0.0  0.0 - 0.7 K/uL   Basophils Relative 0  0 - 1 %   Basophils Absolute 0.0  0.0 - 0.1 K/uL  HEMOGLOBIN A1C     Status: Abnormal   Collection Time    05/01/13  9:08 AM      Result Value Range   Hemoglobin A1C 10.2 (*) <5.7 %   Comment: (NOTE)  According to the ADA Clinical Practice Recommendations for 2011, when     HbA1c is used as a screening test:      >=6.5%   Diagnostic of Diabetes Mellitus               (if abnormal result is confirmed)     5.7-6.4%   Increased risk of developing Diabetes Mellitus     References:Diagnosis and Classification of Diabetes Mellitus,Diabetes     [id number] 1):S62-S69 and Standards of Medical Care in             Diabetes - 2011,Diabetes [id number] (Suppl 1):S11-S61.   Mean Plasma Glucose 246 (*) <117 mg/dL   Comment: Performed at [facility], [facility]     Status: Abnormal   Collection Time    05/01/13 11:00 AM      Result Value Range   Glucose-Capillary 439 (*) 70 - 99 mg/dL  BASIC METABOLIC PANEL     Status: Abnormal   Collection Time    05/01/13 12:45 PM      Result Value Range   Sodium 126 (*) 135 - 145 mEq/L   Potassium 3.5  3.5 - 5.1 mEq/L   Comment: REPEATED TO VERIFY     DELTA CHECK NOTED   Chloride 82 (*) 96 - 112 mEq/L   CO2 25  19 - 32 mEq/L   Glucose, Bld 471 (*) 70 - 99 mg/dL   BUN 15  6 - 23 mg/dL   Creatinine, Ser 0.55  0.50 - 1.10 mg/dL   Calcium 9.8  8.4  - 10.5 mg/dL   GFR calc non Af Amer >90  >90 mL/min   GFR calc Af Amer >90  >90 mL/min   Comment: (NOTE)     The eGFR has been calculated using the CKD EPI equation.     This calculation has not been validated in all clinical situations.     eGFR's persistently <90 mL/min signify possible Chronic Kidney     Disease.  GLUCOSE, CAPILLARY     Status: Abnormal   Collection Time    05/01/13 12:50 PM      Result Value Range   Glucose-Capillary 420 (*) 70 - 99 mg/dL  GLUCOSE, CAPILLARY     Status: Abnormal   Collection Time    05/01/13  2:02 PM      Result Value Range   Glucose-Capillary 392 (*) 70 - 99 mg/dL  MRSA PCR SCREENING     Status: None   Collection Time    05/01/13  2:07 PM      Result Value Range   MRSA by PCR NEGATIVE  NEGATIVE   Comment:            The GeneXpert MRSA Assay (FDA     approved for NASAL specimens     only), is one component of a     comprehensive MRSA colonization     surveillance program. It is not     intended to diagnose MRSA     infection nor to guide or     monitor treatment for     MRSA infections.     Performed at [place], [facility]     Status: Abnormal   Collection Time    05/01/13  2:49 PM      Result Value Range   Glucose-Capillary 341 (*) 70 - 99 mg/dL  GLUCOSE, CAPILLARY     Status: Abnormal   Collection Time    05/01/13  4:54 PM      Result Value Range   Glucose-Capillary 238 (*) 70 - 99 mg/dL  GLUCOSE, CAPILLARY     Status: Abnormal   Collection Time    05/01/13  6:00 PM      Result Value Range   Glucose-Capillary 181 (*) 70 - 99 mg/dL  GLUCOSE, CAPILLARY     Status: Abnormal   Collection Time    05/01/13  7:07 PM      Result Value Range   Glucose-Capillary 140 (*) 70 - 99 mg/dL  BASIC METABOLIC PANEL     Status: Abnormal   Collection Time    05/01/13  7:58 PM      Result Value Range   Sodium 136  135 - 145 mEq/L   Comment: DELTA CHECK NOTED     REPEATED TO VERIFY   Potassium 3.3 (*) 3.5 - 5.1 mEq/L    Chloride 90 (*) 96 - 112 mEq/L   Comment: DELTA CHECK NOTED     REPEATED TO VERIFY   CO2 30  19 - 32 mEq/L   Glucose, Bld 143 (*) 70 - 99 mg/dL   BUN 19  6 - 23 mg/dL   Creatinine, Ser 1.00  0.50 - 1.10 mg/dL   Comment: DELTA CHECK NOTED     REPEATED TO VERIFY   Calcium 9.6  8.4 - 10.5 mg/dL   GFR calc non Af Amer 68 (*) >90 mL/min   GFR calc Af Amer 79 (*) >90 mL/min   Comment: (NOTE)     The eGFR has been calculated using the CKD EPI equation.     This calculation has not been validated in all clinical situations.     eGFR's persistently <90 mL/min signify possible Chronic Kidney     Disease.  GLUCOSE, CAPILLARY     Status: Abnormal   Collection Time    05/01/13  8:25 PM      Result Value Range   Glucose-Capillary 131 (*) 70 - 99 mg/dL  GLUCOSE, CAPILLARY     Status: Abnormal   Collection Time    05/01/13  9:28 PM      Result Value Range   Glucose-Capillary 136 (*) 70 - 99 mg/dL   Comment 1 Documented in Chart     Comment 2 Notify RN    GLUCOSE, CAPILLARY     Status: Abnormal   Collection Time    05/01/13 10:30 PM      Result Value Range   Glucose-Capillary 129 (*) 70 - 99 mg/dL  GLUCOSE, CAPILLARY     Status: Abnormal   Collection Time    05/01/13 11:43 PM      Result Value Range   Glucose-Capillary 148 (*) 70 - 99 mg/dL  GLUCOSE, CAPILLARY     Status: Abnormal   Collection Time    05/02/13 12:47 AM      Result Value Range   Glucose-Capillary 166 (*) 70 - 99 mg/dL  GLUCOSE, CAPILLARY     Status: Abnormal   Collection Time    05/02/13  1:45 AM      Result Value Range   Glucose-Capillary 159 (*) 70 - 99 mg/dL  GLUCOSE, CAPILLARY     Status: Abnormal   Collection Time    05/02/13  2:49 AM      Result Value Range   Glucose-Capillary 154 (*) 70 - 99 mg/dL  BASIC METABOLIC PANEL     Status: Abnormal   Collection Time    05/02/13  3:40 AM  Result Value Range   Sodium 129 (*) 135 - 145 mEq/L   Comment: RESULT REPEATED AND VERIFIED     DELTA CHECK NOTED    Potassium 2.8 (*) 3.5 - 5.1 mEq/L   Chloride 91 (*) 96 - 112 mEq/L   CO2 25  19 - 32 mEq/L   Glucose, Bld 167 (*) 70 - 99 mg/dL   BUN 21  6 - 23 mg/dL   Creatinine, Ser 1.02  0.50 - 1.10 mg/dL   Calcium 8.9  8.4 - 10.5 mg/dL   GFR calc non Af Amer 66 (*) >90 mL/min   GFR calc Af Amer 77 (*) >90 mL/min   Comment: (NOTE)     The eGFR has been calculated using the CKD EPI equation.     This calculation has not been validated in all clinical situations.     eGFR's persistently <90 mL/min signify possible Chronic Kidney     Disease.  CBC     Status: Abnormal   Collection Time    05/02/13  3:40 AM      Result Value Range   WBC 13.7 (*) 4.0 - 10.5 K/uL   RBC 4.10  3.87 - 5.11 MIL/uL   Hemoglobin 12.2  12.0 - 15.0 g/dL   HCT 34.7 (*) 36.0 - 46.0 %   MCV 84.6  78.0 - 100.0 fL   MCH 29.8  26.0 - 34.0 pg   MCHC 35.2  30.0 - 36.0 g/dL   RDW 14.8  11.5 - 15.5 %   Platelets 208  150 - 400 K/uL   Comment: DELTA CHECK NOTED     REPEATED TO VERIFY     SPECIMEN CHECKED FOR CLOTS  GLUCOSE, CAPILLARY     Status: Abnormal   Collection Time    05/02/13  3:55 AM      Result Value Range   Glucose-Capillary 182 (*) 70 - 99 mg/dL  GLUCOSE, CAPILLARY     Status: Abnormal   Collection Time    05/02/13  4:58 AM      Result Value Range   Glucose-Capillary 139 (*) 70 - 99 mg/dL   Comment 1 Notify RN    GLUCOSE, CAPILLARY     Status: Abnormal   Collection Time    05/02/13  6:02 AM      Result Value Range   Glucose-Capillary 155 (*) 70 - 99 mg/dL  GLUCOSE, CAPILLARY     Status: Abnormal   Collection Time    05/02/13  7:08 AM      Result Value Range   Glucose-Capillary 168 (*) 70 - 99 mg/dL  GLUCOSE, CAPILLARY     Status: Abnormal   Collection Time    05/02/13  8:13 AM      Result Value Range   Glucose-Capillary 158 (*) 70 - 99 mg/dL  BASIC METABOLIC PANEL     Status: Abnormal   Collection Time    05/02/13  8:52 AM      Result Value Range   Sodium 128 (*) 135 - 145 mEq/L   Potassium 3.9   3.5 - 5.1 mEq/L   Comment: REPEATED TO VERIFY     DELTA CHECK NOTED     NO VISIBLE HEMOLYSIS   Chloride 94 (*) 96 - 112 mEq/L   CO2 21  19 - 32 mEq/L   Glucose, Bld 174 (*) 70 - 99 mg/dL   BUN 21  6 - 23 mg/dL   Creatinine, Ser 0.87  0.50 - 1.10 mg/dL  Calcium 8.8  8.4 - 10.5 mg/dL   GFR calc non Af Amer 80 (*) >90 mL/min   GFR calc Af Amer >90  >90 mL/min   Comment: (NOTE)     The eGFR has been calculated using the CKD EPI equation.     This calculation has not been validated in all clinical situations.     eGFR's persistently <90 mL/min signify possible Chronic Kidney     Disease.  MAGNESIUM     Status: Abnormal   Collection Time    05/02/13  8:52 AM      Result Value Range   Magnesium 1.4 (*) 1.5 - 2.5 mg/dL  BASIC METABOLIC PANEL     Status: Abnormal   Collection Time    05/02/13 11:55 AM      Result Value Range   Sodium 130 (*) 135 - 145 mEq/L   Potassium 3.6  3.5 - 5.1 mEq/L   Chloride 92 (*) 96 - 112 mEq/L   CO2 25  19 - 32 mEq/L   Glucose, Bld 144 (*) 70 - 99 mg/dL   BUN 19  6 - 23 mg/dL   Creatinine, Ser 0.84  0.50 - 1.10 mg/dL   Calcium 8.4  8.4 - 10.5 mg/dL   GFR calc non Af Amer 84 (*) >90 mL/min   GFR calc Af Amer >90  >90 mL/min   Comment: (NOTE)     The eGFR has been calculated using the CKD EPI equation.     This calculation has not been validated in all clinical situations.     eGFR's persistently <90 mL/min signify possible Chronic Kidney     Disease.    Ir Replc Gastro/colonic Tube Percut W/fluoro  05/01/2013   CLINICAL DATA:  Malpositioned GJ tube. Jejunal tube has refluxed into the esophagus.  EXAM: GASTROSTOMY CATHETER REPLACEMENT  Physician: Stephan Minister. Anselm Pancoast, MD  MEDICATIONS: None  ANESTHESIA/SEDATION: Moderate sedation time: None  FLUOROSCOPY TIME:  10 min and 18 seconds  PROCEDURE: The anterior abdomen was prepped and draped in a sterile fashion. Maximal barrier sterile technique was utilized including caps, mask, sterile gowns, sterile gloves,  sterile drape, hand hygiene and skin antiseptic. The existing tube was completely removed. A C2 catheter and Kumpe catheter were advanced into the stomach. Air was injected in the stomach to identify the distal aspect of the stomach and possibly the small bowel. Neither a catheter or wire could be advanced outside of the stomach. As a result, an 81 French balloon retention gastrostomy tube was placed. Balloon was inflated with 8 mL of saline.  COMPLICATIONS: None  FINDINGS: Unable to cannulate the distal stomach or small bowel. As result, a GJ tube could not be placed. A 18 French balloon retention gastrostomy tube was placed.  IMPRESSION: Unsuccessful attempt at replacing the [place] tube. A new gastrostomy tube was placed. Consider catheter placement with endoscopy.   Electronically Signed   By: Markus Daft M.D.   On: 05/01/2013 17:01   Dg Abd Acute W/chest  05/01/2013   CLINICAL DATA:  Severe abdominal pain this morning, history pancreatitis, hypertension, diabetes, GERD, asthma, coronary artery disease post MI, COPD  EXAM: ACUTE ABDOMEN SERIES (ABDOMEN 2 VIEW & CHEST 1 VIEW)  COMPARISON:  04/11/2013  Correlation: CT abdomen 02/06/2013  FINDINGS: Normal heart size, mediastinal contours, and pulmonary vascularity.  Bronchitic changes without infiltrate, pleural effusion or pneumothorax.  Bones demineralized.  Normal bowel gas pattern.  No bowel dilatation, bowel wall thickening, or free intraperitoneal air.  Evidence of prior bowel surgery in the left upper quadrant.  Catheter projects over the stomach and mid/distal esophagus ; this was coiled within the stomach on a prior CT.  Post laminectomies of L4 and L5 with Ray cage fusion of L4-L5.  Scattered pelvic phleboliths without urinary tract calcification.  IMPRESSION: No acute cardiopulmonary abnormalities.  Normal bowel gas pattern.  Catheter projects over the stomach and mid to distal esophagus ; prior CT suggests this is a gastrostomy tube, the tip of which is  now within the esophagus rather than within the stomach, consider repositioning.   Electronically Signed   By: Lavonia Dana M.D.   On: 05/01/2013 10:08    Review of Systems  Unable to perform ROS: other  Constitutional:       Pt doesn't remember a good deal of her history,   Blood pressure 129/92, pulse 102, temperature 98.7 F (37.1 C), temperature source Oral, resp. rate 19, height 5\' 4"  (1.626 m), weight 62.596 kg (138 lb), SpO2 96.00%. Physical Exam  Constitutional: She is oriented to person, place, and time. She appears well-developed and well-nourished.  She is crying or moaning when you talk with her or touch her abdomen.  HENT:  Head: Normocephalic and atraumatic.  Nose: Nose normal.  Eyes: Conjunctivae and EOM are normal. Pupils are equal, round, and reactive to light. Right eye exhibits no discharge. Left eye exhibits no discharge. No scleral icterus.  Neck: Normal range of motion. Neck supple. No JVD present. No tracheal deviation present. No thyromegaly present.  Cardiovascular: Normal rate, regular rhythm, normal heart sounds and intact distal pulses.  Exam reveals no gallop.   No murmur heard. Respiratory: Effort normal and breath sounds normal. No respiratory distress. She has no wheezes. She has no rales. She exhibits no tenderness.  GI: Soft. Bowel sounds are normal. She exhibits no distension (any place you touch her she is tender, on dilaudid for chronic abdominal pain.) and no mass. There is tenderness. There is guarding. There is no rebound.    Musculoskeletal: She exhibits no edema and no tenderness.  Lymphadenopathy:    She has no cervical adenopathy.  Neurological: She is alert and oriented to person, place, and time. No cranial nerve deficit.  Skin: Skin is warm and dry. No rash noted. No erythema. No pallor.  Psychiatric: Her behavior is normal.  She is very sensitive, moans and almost crys when you touch her.  Hard to assess her actual status.     Assessment/Plan: 1.  Intractable Nausea and vomiting, Dysphagia/achalasia, gastroparesis.  S/p multiple Feeding tube placements. 2. Chronic narcotic use for abdominal pain/CHRONIC Pancreatitis 3.  Diabetes insulin dependant  4. Hypertension 5. CAD/hx of prior MI 6. Seizures 7.Neuropaty 8.COPD  Plan:  She has been seen by Dr. Marlou Starks. It is his opinion that an attempt to replace the current G tube, with a GJ tube and have GI endoscopically position it would be one option.  Another option is to  try using the current G tube and decrease narcotics, add motility agents.  A final option is to do this surgically but with her multiple prior procedures this would be a major operation.  With her history of tube failures, there is no reason to think this would be a definitive treatment.   Idalis Hoelting 05/02/2013, 12:35 PM

## 2013-05-02 NOTE — Progress Notes (Signed)
Pt has c/o nausea intermittently today with no emesis , requiring medication with phenergan 12.5 mg x 1. Medicated for pain in abdomen x 3 today. Johnanthony Wilden, Beverly Gust, RN

## 2013-05-03 ENCOUNTER — Telehealth (HOSPITAL_COMMUNITY): Payer: Self-pay | Admitting: *Deleted

## 2013-05-03 ENCOUNTER — Inpatient Hospital Stay (HOSPITAL_COMMUNITY): Payer: Medicare Other

## 2013-05-03 DIAGNOSIS — E46 Unspecified protein-calorie malnutrition: Secondary | ICD-10-CM

## 2013-05-03 LAB — BASIC METABOLIC PANEL
BUN: 16 mg/dL (ref 6–23)
CO2: 25 mEq/L (ref 19–32)
Calcium: 8.3 mg/dL — ABNORMAL LOW (ref 8.4–10.5)
Chloride: 97 mEq/L (ref 96–112)
Creatinine, Ser: 0.73 mg/dL (ref 0.50–1.10)
GFR calc Af Amer: >90 mL/min (ref >90)
GFR calc non Af Amer: >90 mL/min (ref >90)
Glucose, Bld: 149 mg/dL — ABNORMAL HIGH (ref 70–99)
Glucose, Bld: 170 mg/dL — ABNORMAL HIGH (ref 70–99)
Potassium: 3.3 mEq/L — ABNORMAL LOW (ref 3.5–5.1)
Potassium: 3.8 mEq/L (ref 3.5–5.1)
Sodium: 133 mEq/L — ABNORMAL LOW (ref 135–145)

## 2013-05-03 LAB — CBC
HCT: 31.1 % — ABNORMAL LOW (ref 36.0–46.0)
Hemoglobin: 10.5 g/dL — ABNORMAL LOW (ref 12.0–15.0)
MCH: 29.6 pg (ref 26.0–34.0)
MCHC: 33.8 g/dL (ref 30.0–36.0)
MCV: 87.6 fL (ref 78.0–100.0)
Platelets: 175 10*3/uL (ref 150–400)
RBC: 3.55 MIL/uL — ABNORMAL LOW (ref 3.87–5.11)
WBC: 8.4 10*3/uL (ref 4.0–10.5)

## 2013-05-03 LAB — GLUCOSE, CAPILLARY
Glucose-Capillary: 233 mg/dL — ABNORMAL HIGH (ref 70–99)
Glucose-Capillary: 136 mg/dL — ABNORMAL HIGH (ref 70–99)
Glucose-Capillary: 164 mg/dL — ABNORMAL HIGH (ref 70–99)
Glucose-Capillary: 141 mg/dL — ABNORMAL HIGH (ref 70–99)
Glucose-Capillary: 166 mg/dL — ABNORMAL HIGH (ref 70–99)

## 2013-05-03 MED ORDER — POTASSIUM CHLORIDE 10 MEQ/100ML IV SOLN
INTRAVENOUS | Status: AC
  Filled 2013-05-03: qty 100

## 2013-05-03 MED ORDER — ADULT MULTIVITAMIN LIQUID CH
5.0000 mL | Freq: Every day | ORAL | Status: DC
  Administered 2013-05-03 – 2013-05-07 (×5): 5 mL
  Filled 2013-05-03 (×5): qty 5

## 2013-05-03 MED ORDER — ADULT MULTIVITAMIN LIQUID CH: 5.0000 mL | Freq: Every day | ORAL | Status: DC

## 2013-05-03 MED ORDER — POTASSIUM CHLORIDE 10 MEQ/100ML IV SOLN
10.0000 meq | INTRAVENOUS | Status: AC
  Administered 2013-05-03 (×2): 10 meq via INTRAVENOUS

## 2013-05-03 MED ORDER — VITAL AF 1.2 CAL PO LIQD
1000.0000 mL | ORAL | Status: DC
  Administered 2013-05-03 – 2013-05-04 (×2): 1000 mL
  Filled 2013-05-03 (×3): qty 1000

## 2013-05-03 NOTE — Progress Notes (Signed)
Subjective: Complains of abdominal pain and pain in chest  Objective: Vital signs in last 24 hours: Temp:  [98.4 F (36.9 C)-98.8 F (37.1 C)] 98.8 F (37.1 C) (10/03 0400) Pulse Rate:  [95-111] 95 (10/03 0425) Resp:  [14-24] 19 (10/03 0425) BP: (105-137)/(67-92) 137/92 mmHg (10/03 0425) SpO2:  [95 %-99 %] 99 % (10/03 0425) Weight:  [145 lb (65.772 kg)] 145 lb (65.772 kg) (10/03 0500) Last BM Date:  (unknown)  Intake/Output from previous day: 10/02 0701 - 10/03 0700 In: 3140 [I.V.:2890; IV Piggyback:250] Out: 1150 [Urine:1150] Intake/Output this shift: Total I/O In: 125 [I.V.:125] Out: -   GI: diffuse tenderness unchanged  Lab Results:   Recent Labs  05/02/13 0340 05/03/13 0630  WBC 13.7* 8.4  HGB 12.2 10.5*  HCT 34.7* 31.1*  PLT 208 175   BMET  Recent Labs  05/02/13 1155 05/03/13 0001  NA 130* 132*  K 3.6 3.3*  CL 92* 97  CO2 25 25  GLUCOSE 144* 149*  BUN 19 16  CREATININE 0.84 0.78  CALCIUM 8.4 8.4   PT/INR No results found for this basename: LABPROT, INR,  in the last 72 hours ABG No results found for this basename: PHART, PCO2, PO2, HCO3,  in the last 72 hours  Studies/Results: Ir Replc Gastro/colonic Tube Percut W/fluoro  05/01/2013   CLINICAL DATA:  Malpositioned GJ tube. Jejunal tube has refluxed into the esophagus.  EXAM: GASTROSTOMY CATHETER REPLACEMENT  Physician: Stephan Minister. Anselm Pancoast, MD  MEDICATIONS: None  ANESTHESIA/SEDATION: Moderate sedation time: None  FLUOROSCOPY TIME:  10 min and 18 seconds  PROCEDURE: The anterior abdomen was prepped and draped in a sterile fashion. Maximal barrier sterile technique was utilized including caps, mask, sterile gowns, sterile gloves, sterile drape, hand hygiene and skin antiseptic. The existing tube was completely removed. A C2 catheter and Kumpe catheter were advanced into the stomach. Air was injected in the stomach to identify the distal aspect of the stomach and possibly the small bowel. Neither a catheter  or wire could be advanced outside of the stomach. As a result, an 60 French balloon retention gastrostomy tube was placed. Balloon was inflated with 8 mL of saline.  COMPLICATIONS: None  FINDINGS: Unable to cannulate the distal stomach or small bowel. As result, a GJ tube could not be placed. A 18 French balloon retention gastrostomy tube was placed.  IMPRESSION: Unsuccessful attempt at replacing the [place] tube. A new gastrostomy tube was placed. Consider catheter placement with endoscopy.   Electronically Signed   By: Markus Daft M.D.   On: 05/01/2013 17:01   Dg Abd Acute W/chest  05/01/2013   CLINICAL DATA:  Severe abdominal pain this morning, history pancreatitis, hypertension, diabetes, GERD, asthma, coronary artery disease post MI, COPD  EXAM: ACUTE ABDOMEN SERIES (ABDOMEN 2 VIEW & CHEST 1 VIEW)  COMPARISON:  04/11/2013  Correlation: CT abdomen 02/06/2013  FINDINGS: Normal heart size, mediastinal contours, and pulmonary vascularity.  Bronchitic changes without infiltrate, pleural effusion or pneumothorax.  Bones demineralized.  Normal bowel gas pattern.  No bowel dilatation, bowel wall thickening, or free intraperitoneal air.  Evidence of prior bowel surgery in the left upper quadrant.  Catheter projects over the stomach and mid/distal esophagus ; this was coiled within the stomach on a prior CT.  Post laminectomies of L4 and L5 with Ray cage fusion of L4-L5.  Scattered pelvic phleboliths without urinary tract calcification.  IMPRESSION: No acute cardiopulmonary abnormalities.  Normal bowel gas pattern.  Catheter projects over the stomach and mid  to distal esophagus ; prior CT suggests this is a gastrostomy tube, the tip of which is now within the esophagus rather than within the stomach, consider repositioning.   Electronically Signed   By: Lavonia Dana M.D.   On: 05/01/2013 10:08    Anti-infectives: Anti-infectives   None      Assessment/Plan: s/p * No surgery found * Consider feeding stomach with  pro motility agents vs replacing g tube with gj tube and have tip placed endoscopically  LOS: 2 days    TOTH III,Marium Ragan S 05/03/2013

## 2013-05-03 NOTE — Procedures (Signed)
R arm PowerPICC placed under [place] and fluoroscopy No ptx on spot chest radiograph, tip at SVC/RA junction. No complication No blood loss. See complete dictation in [facility].

## 2013-05-03 NOTE — Progress Notes (Signed)
Patient ID: [name], female   DOB: 1970/12/29, 42 y.o.   MRN: [id number] [facility] Gastroenterology Progress Note  [name] 42 y.o. 07/22/1971   Subjective: Complaining of severe abdominal pain; dry mouth and throat; dry heaves; G-tube feeds started just now by nursing  Objective: Vital signs in last 24 hours: Filed Vitals:   05/03/13 0800  BP:   Pulse:   Temp: 98 F (36.7 C)  Resp:     Physical Exam: Gen: severe acute distress with facial grimace, uncomfortable Abd: diffusely tender with guarding to light palpation; soft; nondistended; +BS; G-tube noted  Lab Results:  Recent Labs  05/02/13 0852 05/02/13 1155 05/03/13 0001 05/03/13 0630  NA 128* 130* 132*  --   K 3.9 3.6 3.3*  --   CL 94* 92* 97  --   CO2 21 25 25   --   GLUCOSE 174* 144* 149*  --   BUN 21 19 16   --   CREATININE 0.87 0.84 0.78  --   CALCIUM 8.8 8.4 8.4  --   MG 1.4*  --   --   --   PHOS  --   --   --  3.4    Recent Labs  05/01/13 0814  AST 31  ALT 23  ALKPHOS 159*  BILITOT 0.3  PROT 8.6*  ALBUMIN 3.5    Recent Labs  05/01/13 0908 05/02/13 0340 05/03/13 0630  WBC 13.1* 13.7* 8.4  NEUTROABS 10.9*  --   --   HGB 14.1 12.2 10.5*  HCT 39.4 34.7* 31.1*  MCV 84.2 84.6 87.6  PLT 283 208 175   No results found for this basename: LABPROT, INR,  in the last 72 hours    Assessment/Plan: Chronic abdominal pain and history of gastroparesis - s/p G-tube and unsuccessful radiologic placement of new GJ tube. Surgical note reviewed. Very doubtful that endoscopic placement of J-tube will be helpful due to high chance of it refluxing back during removal of endoscope if attempted. Would favor G-tube feeds as you are doing and prokinetics; I am NOT convinced she needs an EUS as an outpt at this time. Continue supportive care; Will follow.   [place] C. 05/03/2013, 10:53 AM

## 2013-05-03 NOTE — Progress Notes (Signed)
NUTRITION FOLLOW UP  Intervention:   - Will initiate TF of Vital 1.2 AF via G tube at 85ml/hr (will provide 576 calories, 36g protein, meeting 34% estimated calorie needs, 40% estimated protein needs) and leave and this rate until phosphorus level is ordered and found to be WNL. Text paged MD to order phosphorus level.  - Will initiate adult enteral protocol - Will order 34ml liquid multivitamin thru G tube  - Will continue to monitor   Nutrition Dx:   Inadequate oral intake related to inability to eat as evidenced by NPO - ongoing   Goal:   Resume TF as appropriate with goal to meet >90% of estimated nutritional needs - TF initiated but not at goal yet  Monitor:   Weights, labs, nausea, TF tolerance  Assessment:   71 French G tube placed 10/1 as MD was unable to place GJ tube. Pt had intermittent nausea yesterday with no vomiting. Received consult for TF initiation and management. Will select elemental TF formula of Vital 1.2 AF to promote tolerance. Noted pt with slightly low potassium and magnesium and elevated Alk phos. Pt getting IV KCl and magnesium sulfate. Concerned that phosphorus will be low as well, will request MD order to check for refeeding syndrome.   Height: Ht Readings from Last 1 Encounters:  05/01/13 5\' 4"  (1.626 m)    Weight Status:   Wt Readings from Last 1 Encounters:  05/03/13 145 lb (65.772 kg)    Re-estimated needs:  Kcal: 1700-1900  Protein: 90-100 grams  Fluid: 1.9-2.1 L/day   Skin: intact    Diet Order: NPO   Intake/Output Summary (Last 24 hours) at 05/03/13 0932 Last data filed at 05/03/13 0800  Gross per 24 hour  Intake 3002.5 ml  Output   1150 ml  Net 1852.5 ml    Last BM: PTA   Labs:   Recent Labs Lab 05/02/13 0852 05/02/13 1155 05/03/13 0001  NA 128* 130* 132*  K 3.9 3.6 3.3*  CL 94* 92* 97  CO2 21 25 25   BUN 21 19 16   CREATININE 0.87 0.84 0.78  CALCIUM 8.8 8.4 8.4  MG 1.4*  --   --   GLUCOSE 174* 144* 149*    CBG  (last 3)   Recent Labs  05/02/13 2338 05/03/13 0339 05/03/13 0724  GLUCAP 153* 136* 135*    Scheduled Meds: . enoxaparin (LOVENOX) injection  40 mg Subcutaneous Q24H  . insulin aspart  0-9 Units Subcutaneous Q4H  . insulin glargine  15 Units Subcutaneous Daily  . metoCLOPramide (REGLAN) injection  5 mg Intravenous Q6H  . mometasone-formoterol  2 puff Inhalation BID  . pantoprazole (PROTONIX) IV  40 mg Intravenous Q24H    Continuous Infusions: . sodium chloride Stopped (05/01/13 1930)  . dextrose 5 % and 0.45% NaCl 125 mL/hr (05/02/13 1844)     Mikey College MS, RD, LDN [phone] Pager [phone] After Hours Pager

## 2013-05-03 NOTE — Progress Notes (Signed)
[facility] PROGRESS NOTE  [name] [id number] DOB: 1970-08-14 DOA: 05/01/2013 PCP: [name], MD HPI: [name] is a 42 y.o. female with with h/o DM, recently discharged from the hospital after being treated for similar complaints, comes back for intractable nausea and vomiting and reports the g tube came out of her mouth during an episode of emesis, but was able to swallow it back. abd film showed g tube in the esophagus. Her symptoms improved transiently in the ED. She was also found to be in mild DKA on admission. She was admitted to step down and started her on glucostabilizer. IR consulted for PEJ tube repositioning. Gi consulted for further management. Recommended surgical consultation to put another PEJ tube. Surgery consulted , recommended endoscopic placement of GJ tube vs stomach feeds with pro motility agents.  Assessment/Plan: 1. Intractable Nausea and vomiting: improved after IR put a G TUBE . Recommended endoscopic placement vs surgical placement of another PEJ tube.  - resume IV anti emetics, IV fluids and pain control as needed.  - if EUS is not scheduled we will go ahead and start her on g tube feeds.  - lipase levels within normal limits.  - liver function tests within normal limits.  - appreciate GI and surgery input.  - nutrition consult to resume tube feeds at a low dose.   2. Mild DKA: Resolved.  - started her on sq lantus and SSI - HGBA1C is 10.2.  3. Leukocytosis: unclear etiology, chest film did not show any cardiopulmonary abnormalities. She is currently afebrile and non toxic looking. ua no sign of infection.   4. Hyponatremia: possibly from emesis. Improving. Continue to monitor.   5. Hypokalemia and hypomagnesemia: is being repleted as needed.   6. DVT prophylaxis.   Code Status: full code Family Communication: none at bedside Disposition Plan: pending.    Consultants:  GI  IR  SURGERY  Procedures:  G TUBE PLACEMENT.    Antibiotics:  NONE  HPI/Subjective: Moaning as before.   Objective: Filed Vitals:   05/03/13 1200  BP:   Pulse:   Temp: 98.8 F (37.1 C)  Resp:     Intake/Output Summary (Last 24 hours) at 05/03/13 1313 Last data filed at 05/03/13 1100  Gross per 24 hour  Intake   2875 ml  Output    990 ml  Net   1885 ml   Filed Weights   05/01/13 1400 05/03/13 0500  Weight: 62.596 kg (138 lb) 65.772 kg (145 lb)    Exam:   General:  Afebrile comfortable, sleeping  Cardiovascular: s1s2   Respiratory: ctab  Abdomen: soft Mildly tender, non distended   Musculoskeletal: no pedal edema.   Data Reviewed: Basic Metabolic Panel:  Recent Labs Lab 05/02/13 0340 05/02/13 0852 05/02/13 1155 05/03/13 0001 05/03/13 0630 05/03/13 1205  NA 129* 128* 130* 132*  --  133*  K 2.8* 3.9 3.6 3.3*  --  3.8  CL 91* 94* 92* 97  --  98  CO2 25 21 25 25   --  24  GLUCOSE 167* 174* 144* 149*  --  170*  BUN 21 21 19 16   --  12  CREATININE 1.02 0.87 0.84 0.78  --  0.73  CALCIUM 8.9 8.8 8.4 8.4  --  8.3*  MG  --  1.4*  --   --   --   --   PHOS  --   --   --   --  3.4  --  Liver Function Tests:  Recent Labs Lab 05/01/13 0814  AST 31  ALT 23  ALKPHOS 159*  BILITOT 0.3  PROT 8.6*  ALBUMIN 3.5    Recent Labs Lab 05/01/13 0814  LIPASE 37  AMYLASE 78   No results found for this basename: AMMONIA,  in the last 168 hours CBC:  Recent Labs Lab 05/01/13 0908 05/02/13 0340 05/03/13 0630  WBC 13.1* 13.7* 8.4  NEUTROABS 10.9*  --   --   HGB 14.1 12.2 10.5*  HCT 39.4 34.7* 31.1*  MCV 84.2 84.6 87.6  PLT 283 208 175   Cardiac Enzymes:  Recent Labs Lab 05/01/13 0814  TROPONINI <0.30   BNP (last 3 results) No results found for this basename: PROBNP,  in the last 8760 hours CBG:  Recent Labs Lab 05/02/13 1617 05/02/13 1958 05/02/13 2338 05/03/13 0339 05/03/13 0724  GLUCAP 185* 168* 153* 136* 135*    Recent Results (from the past 240 hour(s))  MRSA PCR  SCREENING     Status: None   Collection Time    05/01/13  2:07 PM      Result Value Range Status   MRSA by PCR NEGATIVE  NEGATIVE Final   Comment:            The GeneXpert MRSA Assay (FDA     approved for NASAL specimens     only), is one component of a     comprehensive MRSA colonization     surveillance program. It is not     intended to diagnose MRSA     infection nor to guide or     monitor treatment for     MRSA infections.     Performed at [facility]     Studies: [place] Gastro/colonic Tube Percut W/fluoro  05/01/2013   CLINICAL DATA:  Malpositioned GJ tube. Jejunal tube has refluxed into the esophagus.  EXAM: GASTROSTOMY CATHETER REPLACEMENT  Physician: Stephan Minister. Anselm Pancoast, MD  MEDICATIONS: None  ANESTHESIA/SEDATION: Moderate sedation time: None  FLUOROSCOPY TIME:  10 min and 18 seconds  PROCEDURE: The anterior abdomen was prepped and draped in a sterile fashion. Maximal barrier sterile technique was utilized including caps, mask, sterile gowns, sterile gloves, sterile drape, hand hygiene and skin antiseptic. The existing tube was completely removed. A C2 catheter and Kumpe catheter were advanced into the stomach. Air was injected in the stomach to identify the distal aspect of the stomach and possibly the small bowel. Neither a catheter or wire could be advanced outside of the stomach. As a result, an 18 French balloon retention gastrostomy tube was placed. Balloon was inflated with 8 mL of saline.  COMPLICATIONS: None  FINDINGS: Unable to cannulate the distal stomach or small bowel. As result, a GJ tube could not be placed. A 18 French balloon retention gastrostomy tube was placed.  IMPRESSION: Unsuccessful attempt at replacing the [place] tube. A new gastrostomy tube was placed. Consider catheter placement with endoscopy.   Electronically Signed   By: Markus Daft M.D.   On: 05/01/2013 17:01    Scheduled Meds: . enoxaparin (LOVENOX) injection  40 mg Subcutaneous Q24H  . feeding  supplement (VITAL AF 1.2 CAL)  1,000 mL Per Tube Q24H  . insulin aspart  0-9 Units Subcutaneous Q4H  . insulin glargine  15 Units Subcutaneous Daily  . metoCLOPramide (REGLAN) injection  5 mg Intravenous Q6H  . mometasone-formoterol  2 puff Inhalation BID  . multivitamin  5 mL Per Tube Daily  . pantoprazole (  PROTONIX) IV  40 mg Intravenous Q24H   Continuous Infusions: . sodium chloride Stopped (05/01/13 1930)  . dextrose 5 % and 0.45% NaCl 125 mL/hr at 05/03/13 1045    Active Problems:   Protein-calorie malnutrition, severe    Time spent: 35 min    [place] [facility] Pager [phone], please contact night-coverage at [email or web address], password [facility] 05/03/2013, 1:13 PM  LOS: 2 days

## 2013-05-04 LAB — GLUCOSE, CAPILLARY
Glucose-Capillary: 136 mg/dL — ABNORMAL HIGH (ref 70–99)
Glucose-Capillary: 168 mg/dL — ABNORMAL HIGH (ref 70–99)
Glucose-Capillary: 188 mg/dL — ABNORMAL HIGH (ref 70–99)
Glucose-Capillary: 83 mg/dL (ref 70–99)
Glucose-Capillary: 83 mg/dL (ref 70–99)

## 2013-05-04 LAB — BASIC METABOLIC PANEL
BUN: 8 mg/dL (ref 6–23)
CO2: 25 mEq/L (ref 19–32)
Chloride: 100 mEq/L (ref 96–112)
Chloride: 101 mEq/L (ref 96–112)
GFR calc Af Amer: >90 mL/min (ref >90)
GFR calc non Af Amer: >90 mL/min (ref >90)
Potassium: 3.1 mEq/L — ABNORMAL LOW (ref 3.5–5.1)
Potassium: 3.4 mEq/L — ABNORMAL LOW (ref 3.5–5.1)
Sodium: 133 mEq/L — ABNORMAL LOW (ref 135–145)
Sodium: 135 mEq/L (ref 135–145)

## 2013-05-04 MED ORDER — POTASSIUM CHLORIDE 20 MEQ/15ML (10%) PO LIQD
40.0000 meq | Freq: Two times a day (BID) | ORAL | Status: DC
  Administered 2013-05-04 – 2013-05-06 (×5): 40 meq
  Filled 2013-05-04 (×6): qty 30

## 2013-05-04 MED ORDER — HYDROMORPHONE HCL PF 1 MG/ML IJ SOLN
0.5000 mg | INTRAMUSCULAR | Status: DC | PRN
  Administered 2013-05-04 – 2013-05-07 (×22): 1 mg via INTRAVENOUS
  Filled 2013-05-04 (×22): qty 1

## 2013-05-04 MED ORDER — SODIUM CHLORIDE 0.9 % IJ SOLN
10.0000 mL | INTRAMUSCULAR | Status: DC | PRN
  Administered 2013-05-04 – 2013-05-07 (×5): 10 mL

## 2013-05-04 NOTE — Progress Notes (Signed)
NUTRITION FOLLOW UP  Intervention:   1. Advance Vital AF 1.2 by 10 ml q 8 hr to a goal rate of 60 ml/hr, this EN regimen will provide 1728 kcal, 108 gm protein, and 1168 ml free water daily. Additional free water needs currently being met with IVF.   2. Please monitor magnesium, potassium, and phosphorus daily for at least 3 days, MD to replete as needed, as pt is at risk for refeeding syndrome given severe malnutrition.   Nutrition Dx:   Inadequate oral intake related to inability to eat as evidenced by NPO. ongoing  Goal:   Resume TF as appropriate to meet >/=90% estimated nutrition needs. Progressing.   Monitor:   TF advance, weight trends/tolerance, labs  Assessment:   Pt started on Vital 1.2 yesterday at a rate of 20 ml/hr. Concern for possible refeeding syndrome given 'lyte abnormalities. Phos WNL on 10/3. K+ slightly low this morning. No recent Mag level. RD will start advancement of enteral nutrition. Will advance slowely (q8 hr) given pt's hx of poor tolerance and possible ongoing refeeding. Recommend continue to monitor Phos, Mag and K+ x 3 days and replete as needed.   Height: Ht Readings from Last 1 Encounters:  05/03/13 5\' 4"  (1.626 m)    Weight Status:   Wt Readings from Last 1 Encounters:  05/04/13 147 lb 4.3 oz (66.8 kg)    Re-estimated needs:  Kcal: 1700-1900 Protein: 90-100 gm  Fluid: 1.9-2.1 L   Skin: intact   Diet Order: NPO   Intake/Output Summary (Last 24 hours) at 05/04/13 0833 Last data filed at 05/04/13 [id number]  Gross per 24 hour  Intake 2927.92 ml  Output   2950 ml  Net -22.08 ml    Last BM: 10/4   Labs:   Recent Labs Lab 05/02/13 0852  05/03/13 0001 05/03/13 0630 05/03/13 1205 05/04/13 0100  NA 128*  < > 132*  --  133* 133*  K 3.9  < > 3.3*  --  3.8 3.1*  CL 94*  < > 97  --  98 100  CO2 21  < > 25  --  24 25  BUN 21  < > 16  --  12 8  CREATININE 0.87  < > 0.78  --  0.73 0.63  CALCIUM 8.8  < > 8.4  --  8.3* 8.2*  MG 1.4*  --    --   --   --   --   PHOS  --   --   --  3.4  --   --   GLUCOSE 174*  < > 149*  --  170* 199*  < > = values in this interval not displayed.  CBG (last 3)   Recent Labs  05/04/13 0410 05/04/13 0500 05/04/13 0746  GLUCAP 168* 203* 165*    Scheduled Meds: . enoxaparin (LOVENOX) injection  40 mg Subcutaneous Q24H  . feeding supplement (VITAL AF 1.2 CAL)  1,000 mL Per Tube Q24H  . insulin aspart  0-9 Units Subcutaneous Q4H  . insulin glargine  15 Units Subcutaneous Daily  . metoCLOPramide (REGLAN) injection  5 mg Intravenous Q6H  . mometasone-formoterol  2 puff Inhalation BID  . multivitamin  5 mL Per Tube Daily  . pantoprazole (PROTONIX) IV  40 mg Intravenous Q24H    Continuous Infusions: . sodium chloride Stopped (05/01/13 1930)  . dextrose 5 % and 0.45% NaCl 125 mL/hr at 05/04/13 0207    Pricilla Holm RD, LDN Pager [phone] After Hours  pager [phone]

## 2013-05-04 NOTE — Progress Notes (Signed)
[facility] PROGRESS NOTE  [name] [id number] DOB: 1970-10-06 DOA: 05/01/2013 PCP: [name], MD HPI: [name] is a 42 y.o. female with with h/o DM, recently discharged from the hospital after being treated for similar complaints, comes back for intractable nausea and vomiting and reports the g tube came out of her mouth during an episode of emesis, but was able to swallow it back. abd film showed g tube in the esophagus. Her symptoms improved transiently in the ED. She was also found to be in mild DKA on admission. She was admitted to step down and started her on glucostabilizer. IR consulted for PEJ tube repositioning. Gi consulted for further management. Recommended surgical consultation to put another PEJ tube. Surgery consulted , recommended endoscopic placement of GJ tube vs stomach feeds with pro motility agents.  Assessment/Plan: 1. Intractable Nausea and vomiting: improved after IR put a G TUBE . Recommended endoscopic placement vs surgical placement of another PEJ tube.  - resume IV anti emetics, IV fluids and pain control as needed.  - if EUS is not scheduled we will go ahead and start her on g tube feeds.  - lipase levels within normal limits.  - liver function tests within normal limits.  - appreciate GI and surgery input.  - nutrition consult to resume tube feeds at a low dose.   2. Mild DKA: Resolved.  - started her on sq lantus and SSI - HGBA1C is 10.2.  3. Leukocytosis: unclear etiology, chest film did not show any cardiopulmonary abnormalities. She is currently afebrile and non toxic looking. ua no sign of infection.   4. Hyponatremia: possibly from emesis. Improving. Continue to monitor.   5. Hypokalemia and hypomagnesemia: is being repleted as needed.   6. DVT prophylaxis.   Code Status: full code Family Communication: none at bedside Disposition Plan: pending.    Consultants:  GI  IR  SURGERY  Procedures:  G TUBE PLACEMENT.    Antibiotics:  NONE  HPI/Subjective: Smiling, and she reports feeling better.   Objective: Filed Vitals:   05/04/13 0503  BP: 143/87  Pulse: 91  Temp: 98.4 F (36.9 C)  Resp: 20    Intake/Output Summary (Last 24 hours) at 05/04/13 1500 Last data filed at 05/04/13 1233  Gross per 24 hour  Intake 2247.92 ml  Output   2250 ml  Net  -2.08 ml   Filed Weights   05/01/13 1400 05/03/13 0500 05/04/13 0503  Weight: 62.596 kg (138 lb) 65.772 kg (145 lb) 66.8 kg (147 lb 4.3 oz)    Exam:   General:  Afebrile comfortable, sleeping  Cardiovascular: s1s2   Respiratory: ctab  Abdomen: soft Mildly tender, non distended   Musculoskeletal: no pedal edema.   Data Reviewed: Basic Metabolic Panel:  Recent Labs Lab 05/02/13 0852 05/02/13 1155 05/03/13 0001 05/03/13 0630 05/03/13 1205 05/04/13 0100 05/04/13 1140  NA 128* 130* 132*  --  133* 133* 135  K 3.9 3.6 3.3*  --  3.8 3.1* 3.4*  CL 94* 92* 97  --  98 100 101  CO2 21 25 25   --  24 25 25   GLUCOSE 174* 144* 149*  --  170* 199* 93  BUN 21 19 16   --  12 8 6   CREATININE 0.87 0.84 0.78  --  0.73 0.63 0.58  CALCIUM 8.8 8.4 8.4  --  8.3* 8.2* 8.4  MG 1.4*  --   --   --   --   --   --  PHOS  --   --   --  3.4  --   --   --    Liver Function Tests:  Recent Labs Lab 05/01/13 0814  AST 31  ALT 23  ALKPHOS 159*  BILITOT 0.3  PROT 8.6*  ALBUMIN 3.5    Recent Labs Lab 05/01/13 0814  LIPASE 37  AMYLASE 78   No results found for this basename: AMMONIA,  in the last 168 hours CBC:  Recent Labs Lab 05/01/13 0908 05/02/13 0340 05/03/13 0630  WBC 13.1* 13.7* 8.4  NEUTROABS 10.9*  --   --   HGB 14.1 12.2 10.5*  HCT 39.4 34.7* 31.1*  MCV 84.2 84.6 87.6  PLT 283 208 175   Cardiac Enzymes:  Recent Labs Lab 05/01/13 0814  TROPONINI <0.30   BNP (last 3 results) No results found for this basename: PROBNP,  in the last 8760 hours CBG:  Recent Labs Lab 05/04/13 0008 05/04/13 0410 05/04/13 0500  05/04/13 0746 05/04/13 1158  GLUCAP 188* 168* 203* 165* 89    Recent Results (from the past 240 hour(s))  MRSA PCR SCREENING     Status: None   Collection Time    05/01/13  2:07 PM      Result Value Range Status   MRSA by PCR NEGATIVE  NEGATIVE Final   Comment:            The GeneXpert MRSA Assay (FDA     approved for NASAL specimens     only), is one component of a     comprehensive MRSA colonization     surveillance program. It is not     intended to diagnose MRSA     infection nor to guide or     monitor treatment for     MRSA infections.     Performed at [place]     Status: None   Collection Time    05/02/13  2:49 PM      Result Value Range Status   Specimen Description URINE, CATHETERIZED   Final   Special Requests NONE   Final   Culture  Setup Time     Final   Value: 05/02/2013 21:04     Performed at [facility] PENDING   Incomplete   Culture     Final   Value: Culture reincubated for better growth     Performed at [facility]   Report Status PENDING   Incomplete     Studies: Ir Fluoro Guide Cv Line Right  05/03/2013   CLINICAL DATA:  Gastroparesis. Needs access for TPN.  EXAM: PICC PLACEMENT WITH ULTRASOUND AND FLUOROSCOPY  TECHNIQUE: After written informed consent was obtained, patient was placed in the supine position on angiographic table. Patency of the right basilic vein was confirmed with ultrasound with image documentation. An appropriate skin site was determined. Skin site was marked. Region was prepped using maximum barrier technique including cap and mask, sterile gown, sterile gloves, large sterile sheet, and Chlorhexidine as cutaneous antisepsis. The region was infiltrated locally with 1% lidocaine. Under real-time ultrasound guidance, the right basilic vein was accessed with a 21 gauge micropuncture needle; the needle tip within the vein was confirmed with ultrasound image documentation. Needle  exchanged over a 018 guidewire for a peel-away sheath, through which a 5-French double-lumen power injectable PICC trimmed to 40cm was advanced, positioned with its tip near the cavoatrial junction. Spot chest radiograph confirms appropriate catheter  position. Catheter was flushed per protocol and secured externally with 0-Prolene sutures. The patient tolerated procedure well, with no immediate complication.  FLUOROSCOPY TIME:  Three seconds  IMPRESSION: Technically successful five [place] double lumen power injectable PICC placement   Electronically Signed   By: Arne Cleveland M.D.   On: 05/03/2013 15:34   Ir US Guide Vasc Access Right  05/03/2013   CLINICAL DATA:  Gastroparesis. Needs access for TPN.  EXAM: PICC PLACEMENT WITH ULTRASOUND AND FLUOROSCOPY  TECHNIQUE: After written informed consent was obtained, patient was placed in the supine position on angiographic table. Patency of the right basilic vein was confirmed with ultrasound with image documentation. An appropriate skin site was determined. Skin site was marked. Region was prepped using maximum barrier technique including cap and mask, sterile gown, sterile gloves, large sterile sheet, and Chlorhexidine as cutaneous antisepsis. The region was infiltrated locally with 1% lidocaine. Under real-time ultrasound guidance, the right basilic vein was accessed with a 21 gauge micropuncture needle; the needle tip within the vein was confirmed with ultrasound image documentation. Needle exchanged over a 018 guidewire for a peel-away sheath, through which a 5-French double-lumen power injectable PICC trimmed to 40cm was advanced, positioned with its tip near the cavoatrial junction. Spot chest radiograph confirms appropriate catheter position. Catheter was flushed per protocol and secured externally with 0-Prolene sutures. The patient tolerated procedure well, with no immediate complication.  FLUOROSCOPY TIME:  Three seconds  IMPRESSION: Technically successful  five [place] double lumen power injectable PICC placement   Electronically Signed   By: Arne Cleveland M.D.   On: 05/03/2013 15:34    Scheduled Meds: . enoxaparin (LOVENOX) injection  40 mg Subcutaneous Q24H  . feeding supplement (VITAL AF 1.2 CAL)  1,000 mL Per Tube Q24H  . insulin aspart  0-9 Units Subcutaneous Q4H  . insulin glargine  15 Units Subcutaneous Daily  . metoCLOPramide (REGLAN) injection  5 mg Intravenous Q6H  . mometasone-formoterol  2 puff Inhalation BID  . multivitamin  5 mL Per Tube Daily  . pantoprazole (PROTONIX) IV  40 mg Intravenous Q24H  . potassium chloride  40 mEq Per Tube BID   Continuous Infusions: . sodium chloride Stopped (05/01/13 1930)  . dextrose 5 % and 0.45% NaCl 125 mL/hr at 05/04/13 [id number]    Active Problems:   Protein-calorie malnutrition, severe    Time spent: 35 min    Ronson Hagins  [facility] Pager [phone], please contact night-coverage at [email or web address], password [facility] 05/04/2013, 3:00 PM  LOS: 3 days

## 2013-05-05 LAB — URINE CULTURE

## 2013-05-05 LAB — PHOSPHORUS: Phosphorus: 3.7 mg/dL (ref 2.3–4.6)

## 2013-05-05 LAB — BASIC METABOLIC PANEL
CO2: 24 mEq/L (ref 19–32)
Chloride: 100 mEq/L (ref 96–112)
Sodium: 133 mEq/L — ABNORMAL LOW (ref 135–145)

## 2013-05-05 LAB — GLUCOSE, CAPILLARY

## 2013-05-05 MED ORDER — INSULIN ASPART 100 UNIT/ML ~~LOC~~ SOLN
0.0000 [IU] | SUBCUTANEOUS | Status: DC
  Administered 2013-05-06: 2 [IU] via SUBCUTANEOUS
  Administered 2013-05-06 (×2): 3 [IU] via SUBCUTANEOUS
  Administered 2013-05-07 (×4): 1 [IU] via SUBCUTANEOUS

## 2013-05-05 MED ORDER — VANCOMYCIN HCL IN DEXTROSE 1-5 GM/200ML-% IV SOLN
1000.0000 mg | Freq: Two times a day (BID) | INTRAVENOUS | Status: DC
  Administered 2013-05-05 – 2013-05-07 (×4): 1000 mg via INTRAVENOUS
  Filled 2013-05-05 (×6): qty 200

## 2013-05-05 MED ORDER — HYDROMORPHONE HCL PF 1 MG/ML IJ SOLN
0.5000 mg | Freq: Once | INTRAMUSCULAR | Status: AC
  Administered 2013-05-05: 0.5 mg via INTRAVENOUS
  Filled 2013-05-05: qty 1

## 2013-05-05 MED ORDER — VITAL AF 1.2 CAL PO LIQD
1000.0000 mL | ORAL | Status: DC
  Administered 2013-05-05 – 2013-05-07 (×2): 1000 mL
  Filled 2013-05-05 (×4): qty 1000

## 2013-05-05 NOTE — Progress Notes (Signed)
Patient ID: [name], female   DOB: 07/13/1971, 42 y.o.   MRN: [id number] [facility] Gastroenterology Progress Note  [name] 42 y.o. 09-Nov-1970   Subjective: Lying in bed. Feels better. Abdominal pain at baseline per pt. No vomiting. No residual on 20 cc/hr TFs. TFs increased to 30 cc/hr this morning.   Objective: Vital signs: Filed Vitals:   05/05/13 1300  BP: 136/77  Pulse: 88  Temp: 98.1 F (36.7 C)  Resp: 17    Physical Exam: Gen: alert, no acute distress  Abd: diffuse tenderness with guarding, soft, nondistended, PEG intact  Lab Results:  Recent Labs  05/03/13 0630  05/04/13 1140 05/05/13 0330  NA  --   < > 135 133*  K  --   < > 3.4* 4.1  CL  --   < > 101 100  CO2  --   < > 25 24  GLUCOSE  --   < > 93 174*  BUN  --   < > 6 5*  CREATININE  --   < > 0.58 0.55  CALCIUM  --   < > 8.4 9.2  MG  --   --   --  1.4*  PHOS 3.4  --   --  3.7  < > = values in this interval not displayed. No results found for this basename: AST, ALT, ALKPHOS, BILITOT, PROT, ALBUMIN,  in the last 72 hours  Recent Labs  05/03/13 0630  WBC 8.4  HGB 10.5*  HCT 31.1*  MCV 87.6  PLT 175      Assessment/Plan: Chronic abdominal pain and gastroparesis - advance TFs as tolerated. Supportive care. Agree with Dr. Karleen Hampshire that it is ok to try bolus feeds once rate has been increased further. Will sign off. Call with questions.   Bridgeton C. 05/05/2013, 6:15 PM

## 2013-05-05 NOTE — Progress Notes (Signed)
ANTIBIOTIC CONSULT NOTE - INITIAL  Pharmacy Consult for Vancomycin Indication: enterococcus   Allergies  Allergen Reactions  . Penicillins Hives  . Cefadroxil     Not sure of effects-listed as allergy on list from home  . Compazine Hives    Tolerates promethazine  . Darvocet [Propoxyphene-Acetaminophen] Other (See Comments)    UNKNOWN  . Nsaids Hives  . Zofran [Ondansetron] Hives    Patient Measurements: Height: 5\' 4"  (162.6 cm) Weight: 140 lb 3.2 oz (63.594 kg) IBW/kg (Calculated) : 54.7   Vital Signs: Temp: 98.1 F (36.7 C) (10/05 1300) Temp src: Oral (10/05 1300) BP: 136/77 mmHg (10/05 1300) Pulse Rate: 88 (10/05 1300) Intake/Output from previous day: 10/04 0701 - 10/05 0700 In: 1030 [NG/GT:820] Out: 4650 [Urine:4650] Intake/Output from this shift: Total I/O In: 60 [Other:60] Out: 800 [Urine:800]  Labs:  Recent Labs  05/03/13 0630  05/04/13 0100 05/04/13 1140 05/05/13 0330  WBC 8.4  --   --   --   --   HGB 10.5*  --   --   --   --   PLT 175  --   --   --   --   CREATININE  --   < > 0.63 0.58 0.55  < > = values in this interval not displayed. Estimated Creatinine Clearance: 78.3 ml/min (by C-G formula based on Cr of 0.55). No results found for this basename: VANCOTROUGH, VANCOPEAK, VANCORANDOM, GENTTROUGH, GENTPEAK, GENTRANDOM, TOBRATROUGH, TOBRAPEAK, TOBRARND, AMIKACINPEAK, AMIKACINTROU, AMIKACIN,  in the last 72 hours   Medical History: Past Medical History  Diagnosis Date  . Hypertension   . Diabetes mellitus   . Gastroparesis   . Asthma   . GERD (gastroesophageal reflux disease)   . Coronary artery disease   . h/o seizure   . Neuropathy   . Chronic pancreatitis   . Dyslipidemia   . MI (myocardial infarction)   . COPD (chronic obstructive pulmonary disease)   . Seizures     Assessment: 51 yoF with enteroccocus species in urine culture (sensitivities as below). Patient with PCN allergy (hives) and admitted with intractable N/V.  MD would  like IV abx for now then transition to oral therapy when N/V improves.   10/5 >> Vanc >>  Tmax: afeb WBCs: wnl Renal: Scr 0.55, CG 78, N> 100 (using Scr 0.8)  10/2 Urine >> enterococcus species (S - ampicillin, nitrofurantoin, vancomycin.  R- Levaquin, tetracycline)   Goal of Therapy:  Vancomycin trough level 10-15 mcg/ml  Plan:   Vancomycin 1gm IV q12h  F/u improving in N/V and transition to PO (nitrofurantoin or fosfomycin)  Vanessa Dillon, PharmD, BCPS Pager: [phone] 3:47 PM Pharmacy #: [id number]

## 2013-05-05 NOTE — Progress Notes (Signed)
[facility] PROGRESS NOTE  BRAYLEI ELLENWOOD [id number] DOB: 1971-02-10 DOA: 05/01/2013 PCP: [name], MD HPI: [name] is a 42 y.o. female with with h/o DM, recently discharged from the hospital after being treated for similar complaints, comes back for intractable nausea and vomiting and reports the g tube came out of her mouth during an episode of emesis, but was able to swallow it back. abd film showed g tube in the esophagus. Her symptoms improved transiently in the ED. She was also found to be in mild DKA on admission. She was admitted to step down and started her on glucostabilizer. IR consulted for PEJ tube repositioning. Gi consulted for further management. Recommended surgical consultation to put another PEJ tube. Surgery consulted , recommended endoscopic placement of GJ tube vs stomach feeds with pro motility agents.  Assessment/Plan: 1. Intractable Nausea and vomiting: improved after IR put a G TUBE . Recommended endoscopic placement vs surgical placement of another PEJ tube.  - resume IV anti emetics, IV fluids and pain control as needed.  - if EUS is not scheduled we will go ahead and start her on g tube feeds.  - lipase levels within normal limits.  - liver function tests within normal limits.  - appreciate GI and surgery input.  - nutrition consult to resume tube feeds at a low dose, and she reports slightly nauseated, pain is better controlled. No vomiting.   2. Mild DKA: Resolved.  - started her on sq lantus and SSI - HGBA1C is 10.2. - CBG (last 3)   Recent Labs  05/04/13 1644 05/04/13 2045 05/05/13 0018  GLUCAP 136* 83  83 178*      3. Leukocytosis: unclear etiology, chest film did not show any cardiopulmonary abnormalities. She is currently afebrile and non toxic looking. Urine cultures show enterococcus infection sensitive to ampicillin. We have started her on unasyn.  4. Hyponatremia: possibly from emesis. Improving. Continue to monitor.   5.  Hypokalemia and hypomagnesemia: is being repleted as needed.   6. DVT prophylaxis.   Code Status: full code Family Communication: none at bedside Disposition Plan: pending.    Consultants:  GI  IR  SURGERY  Procedures:  G TUBE PLACEMENT.   Antibiotics:  unasyn.  HPI/Subjective: She reports light nausea, no vomiting. Pain is better controlled.   Objective: Filed Vitals:   05/05/13 1300  BP: 136/77  Pulse: 88  Temp: 98.1 F (36.7 C)  Resp: 17    Intake/Output Summary (Last 24 hours) at 05/05/13 1432 Last data filed at 05/05/13 1216  Gross per 24 hour  Intake    940 ml  Output   4850 ml  Net  -3910 ml   Filed Weights   05/03/13 0500 05/04/13 0503 05/05/13 0601  Weight: 65.772 kg (145 lb) 66.8 kg (147 lb 4.3 oz) 63.594 kg (140 lb 3.2 oz)    Exam:   General:  Afebrile comfortable, sleeping  Cardiovascular: s1s2   Respiratory: ctab  Abdomen: soft Mildly tender, non distended   Musculoskeletal: no pedal edema.   Data Reviewed: Basic Metabolic Panel:  Recent Labs Lab 05/02/13 0852  05/03/13 0001 05/03/13 0630 05/03/13 1205 05/04/13 0100 05/04/13 1140 05/05/13 0330  NA 128*  < > 132*  --  133* 133* 135 133*  K 3.9  < > 3.3*  --  3.8 3.1* 3.4* 4.1  CL 94*  < > 97  --  98 100 101 100  CO2 21  < > 25  --  24  25 25 24   GLUCOSE 174*  < > 149*  --  170* 199* 93 174*  BUN 21  < > 16  --  12 8 6  5*  CREATININE 0.87  < > 0.78  --  0.73 0.63 0.58 0.55  CALCIUM 8.8  < > 8.4  --  8.3* 8.2* 8.4 9.2  MG 1.4*  --   --   --   --   --   --   --   PHOS  --   --   --  3.4  --   --   --   --   < > = values in this interval not displayed. Liver Function Tests:  Recent Labs Lab 05/01/13 0814  AST 31  ALT 23  ALKPHOS 159*  BILITOT 0.3  PROT 8.6*  ALBUMIN 3.5    Recent Labs Lab 05/01/13 0814  LIPASE 37  AMYLASE 78   No results found for this basename: AMMONIA,  in the last 168 hours CBC:  Recent Labs Lab 05/01/13 0908 05/02/13 0340  05/03/13 0630  WBC 13.1* 13.7* 8.4  NEUTROABS 10.9*  --   --   HGB 14.1 12.2 10.5*  HCT 39.4 34.7* 31.1*  MCV 84.2 84.6 87.6  PLT 283 208 175   Cardiac Enzymes:  Recent Labs Lab 05/01/13 0814  TROPONINI <0.30   BNP (last 3 results) No results found for this basename: PROBNP,  in the last 8760 hours CBG:  Recent Labs Lab 05/04/13 0746 05/04/13 1158 05/04/13 1644 05/04/13 2045 05/05/13 0018  GLUCAP 165* 89 136* 83  83 178*    Recent Results (from the past 240 hour(s))  MRSA PCR SCREENING     Status: None   Collection Time    05/01/13  2:07 PM      Result Value Range Status   MRSA by PCR NEGATIVE  NEGATIVE Final   Comment:            The GeneXpert MRSA Assay (FDA     approved for NASAL specimens     only), is one component of a     comprehensive MRSA colonization     surveillance program. It is not     intended to diagnose MRSA     infection nor to guide or     monitor treatment for     MRSA infections.     Performed at [facility]  URINE CULTURE     Status: None   Collection Time    05/02/13  2:49 PM      Result Value Range Status   Specimen Description URINE, CATHETERIZED   Final   Special Requests NONE   Final   Culture  Setup Time     Final   Value: 05/02/2013 21:04     Performed at [place]     Final   Value: >=100,000 COLONIES/ML     Performed at [facility]   Culture     Final   Value: ENTEROCOCCUS SPECIES     LACTOBACILLUS SPECIES     Note: Standardized susceptibility testing for this organism is not available.     Performed at [facility]   Report Status 05/05/2013 FINAL   Final   Organism ID, Bacteria ENTEROCOCCUS SPECIES   Final     Studies: Ir Fluoro Guide Cv Line Right  05/03/2013   CLINICAL DATA:  Gastroparesis. Needs access for TPN.  EXAM: PICC PLACEMENT WITH ULTRASOUND  AND FLUOROSCOPY  TECHNIQUE: After written informed consent was obtained, patient was placed in the supine position  on angiographic table. Patency of the right basilic vein was confirmed with ultrasound with image documentation. An appropriate skin site was determined. Skin site was marked. Region was prepped using maximum barrier technique including cap and mask, sterile gown, sterile gloves, large sterile sheet, and Chlorhexidine as cutaneous antisepsis. The region was infiltrated locally with 1% lidocaine. Under real-time ultrasound guidance, the right basilic vein was accessed with a 21 gauge micropuncture needle; the needle tip within the vein was confirmed with ultrasound image documentation. Needle exchanged over a 018 guidewire for a peel-away sheath, through which a 5-French double-lumen power injectable PICC trimmed to 40cm was advanced, positioned with its tip near the cavoatrial junction. Spot chest radiograph confirms appropriate catheter position. Catheter was flushed per protocol and secured externally with 0-Prolene sutures. The patient tolerated procedure well, with no immediate complication.  FLUOROSCOPY TIME:  Three seconds  IMPRESSION: Technically successful five [place] double lumen power injectable PICC placement   Electronically Signed   By: Arne Cleveland M.D.   On: 05/03/2013 15:34   Ir US Guide Vasc Access Right  05/03/2013   CLINICAL DATA:  Gastroparesis. Needs access for TPN.  EXAM: PICC PLACEMENT WITH ULTRASOUND AND FLUOROSCOPY  TECHNIQUE: After written informed consent was obtained, patient was placed in the supine position on angiographic table. Patency of the right basilic vein was confirmed with ultrasound with image documentation. An appropriate skin site was determined. Skin site was marked. Region was prepped using maximum barrier technique including cap and mask, sterile gown, sterile gloves, large sterile sheet, and Chlorhexidine as cutaneous antisepsis. The region was infiltrated locally with 1% lidocaine. Under real-time ultrasound guidance, the right basilic vein was accessed with a 21  gauge micropuncture needle; the needle tip within the vein was confirmed with ultrasound image documentation. Needle exchanged over a 018 guidewire for a peel-away sheath, through which a 5-French double-lumen power injectable PICC trimmed to 40cm was advanced, positioned with its tip near the cavoatrial junction. Spot chest radiograph confirms appropriate catheter position. Catheter was flushed per protocol and secured externally with 0-Prolene sutures. The patient tolerated procedure well, with no immediate complication.  FLUOROSCOPY TIME:  Three seconds  IMPRESSION: Technically successful five [place] double lumen power injectable PICC placement   Electronically Signed   By: Arne Cleveland M.D.   On: 05/03/2013 15:34    Scheduled Meds: . enoxaparin (LOVENOX) injection  40 mg Subcutaneous Q24H  . feeding supplement (VITAL AF 1.2 CAL)  1,000 mL Per Tube Q24H  . insulin aspart  0-9 Units Subcutaneous Q4H  . insulin glargine  15 Units Subcutaneous Daily  . metoCLOPramide (REGLAN) injection  5 mg Intravenous Q6H  . mometasone-formoterol  2 puff Inhalation BID  . multivitamin  5 mL Per Tube Daily  . pantoprazole (PROTONIX) IV  40 mg Intravenous Q24H  . potassium chloride  40 mEq Per Tube BID   Continuous Infusions:    Active Problems:   Protein-calorie malnutrition, severe    Time spent: 25 min    [facility] Pager [phone], please contact night-coverage at [email or web address], password [facility] 05/05/2013, 2:32 PM  LOS: 4 days

## 2013-05-05 NOTE — Progress Notes (Signed)
Brief Nutrition Note  Patient on Vital AF 1.2 via G tube. Currently running at 30 ml/hr. Patient complains of continued nausea. Patient at risk for refeeding syndrome given electrolyte abnormalities. Phos WNL on 10/3, K+ WNL this AM. No recent Mg. Will continue to advance TF slowly as tolerated.   Intervention:  1. Continue to advance Vital AF 1.2 by 10 ml/hr every 8 hours to goal rate of 60 ml/hr as tolerated to provide 1728 kcal, 108 g protein, 1168 ml free water.   2. Please monitor magnesium, potassium, and phosphorus daily for at least 3 days, MD to replete as needed, as pt is at risk for refeeding syndrome given severe malnutrition.   Sodium  Date/Time Value Range Status  05/05/2013  3:30 AM 133* 135 - 145 mEq/L Final  05/04/2013 11:40 AM 135  135 - 145 mEq/L Final  05/04/2013  1:00 AM 133* 135 - 145 mEq/L Final    Potassium  Date/Time Value Range Status  05/05/2013  3:30 AM 4.1  3.5 - 5.1 mEq/L Final     DELTA CHECK NOTED     REPEATED TO VERIFY     NO VISIBLE HEMOLYSIS  05/04/2013 11:40 AM 3.4* 3.5 - 5.1 mEq/L Final  05/04/2013  1:00 AM 3.1* 3.5 - 5.1 mEq/L Final     DELTA CHECK NOTED    Phosphorus  Date/Time Value Range Status  05/03/2013  6:30 AM 3.4  2.3 - 4.6 mg/dL Final  02/06/2013  2:10 AM 4.5  2.3 - 4.6 mg/dL Final  02/09/2012  8:00 PM 3.1  2.3 - 4.6 mg/dL Final    Magnesium  Date/Time Value Range Status  05/02/2013  8:52 AM 1.4* 1.5 - 2.5 mg/dL Final  04/11/2013  6:40 PM 1.5  1.5 - 2.5 mg/dL Final  02/12/2013  2:20 PM 1.2* 1.5 - 2.5 mg/dL Final     Larey Seat, RD, LDN Pager #: [place] Pager #: [phone]

## 2013-05-06 LAB — CBC
HCT: 34.4 % — ABNORMAL LOW (ref 36.0–46.0)
Hemoglobin: 11.4 g/dL — ABNORMAL LOW (ref 12.0–15.0)
MCV: 88 fL (ref 78.0–100.0)
RDW: 14.5 % (ref 11.5–15.5)
WBC: 8 10*3/uL (ref 4.0–10.5)

## 2013-05-06 LAB — GLUCOSE, CAPILLARY
Glucose-Capillary: 114 mg/dL — ABNORMAL HIGH (ref 70–99)
Glucose-Capillary: 120 mg/dL — ABNORMAL HIGH (ref 70–99)
Glucose-Capillary: 124 mg/dL — ABNORMAL HIGH (ref 70–99)
Glucose-Capillary: 215 mg/dL — ABNORMAL HIGH (ref 70–99)
Glucose-Capillary: 85 mg/dL (ref 70–99)

## 2013-05-06 LAB — BASIC METABOLIC PANEL
BUN: 15 mg/dL (ref 6–23)
CO2: 24 mEq/L (ref 19–32)
Chloride: 100 mEq/L (ref 96–112)
GFR calc non Af Amer: 82 mL/min — ABNORMAL LOW (ref >90)
Glucose, Bld: 119 mg/dL — ABNORMAL HIGH (ref 70–99)
Potassium: 4.8 mEq/L (ref 3.5–5.1)
Sodium: 133 mEq/L — ABNORMAL LOW (ref 135–145)

## 2013-05-06 LAB — MAGNESIUM: Magnesium: 1.8 mg/dL (ref 1.5–2.5)

## 2013-05-06 LAB — PHOSPHORUS: Phosphorus: 4.3 mg/dL (ref 2.3–4.6)

## 2013-05-06 MED ORDER — HYDROMORPHONE HCL PF 1 MG/ML IJ SOLN
1.0000 mg | Freq: Once | INTRAMUSCULAR | Status: AC
  Administered 2013-05-06: 1 mg via INTRAVENOUS
  Filled 2013-05-06: qty 1

## 2013-05-06 MED ORDER — PANTOPRAZOLE SODIUM 40 MG PO PACK
40.0000 mg | PACK | Freq: Every day | ORAL | Status: DC
  Administered 2013-05-06 – 2013-05-07 (×2): 40 mg
  Filled 2013-05-06 (×3): qty 20

## 2013-05-06 MED ORDER — METOCLOPRAMIDE HCL 5 MG/5ML PO SOLN
10.0000 mg | Freq: Four times a day (QID) | ORAL | Status: DC | PRN
  Filled 2013-05-06: qty 10

## 2013-05-06 NOTE — Progress Notes (Signed)
NUTRITION FOLLOW UP  Intervention:   - Continue TF of Vital AF 1.2 at 48ml/hr and increase by 10 ml q 8 hr to a goal rate of 60 ml/hr, this EN regimen will provide 1728 kcal, 108 gm protein, and 1168 ml free water daily, will meet 102% estimated calorie needs, 120% estimated protein needs. Additional free water needs currently being met with IVF.  - Diet advancement per MD - Will continue to monitor    Nutrition Dx:   Inadequate oral intake related to inability to eat as evidenced by NPO. ongoing  Goal:   Resume TF as appropriate to meet >/=90% estimated nutrition needs. Progressing.   Monitor:   TF advance, weight trends/tolerance, labs  Assessment:   Pt getting Vital AF 1.2 at 44ml/hr - provides 864 calories, 54g protein, 598ml free water meeting 51% estimated calorie needs, 60% estimated protein needs. Met with pt who reports ongoing nausea has not improved over the weekend. C/o some dry heaves however no vomiting. Reports the Vital AF 1.2 feels less thick and lighter on her stomach than the Osmolite 1.2 at home. Pt at risk for refeeding syndrome however not showing signs of refeeding syndrome. Potassium, phosphorous, and magnesium WNL. Pt's weight up 2 pounds since admission.    Potassium  Date/Time Value Range Status  05/06/2013  4:44 AM 4.8  3.5 - 5.1 mEq/L Final  05/05/2013  3:30 AM 4.1  3.5 - 5.1 mEq/L Final     DELTA CHECK NOTED     REPEATED TO VERIFY     NO VISIBLE HEMOLYSIS  05/04/2013 11:40 AM 3.4* 3.5 - 5.1 mEq/L Final    Phosphorus  Date/Time Value Range Status  05/06/2013  4:44 AM 4.3  2.3 - 4.6 mg/dL Final  05/05/2013  3:30 AM 3.7  2.3 - 4.6 mg/dL Final  05/03/2013  6:30 AM 3.4  2.3 - 4.6 mg/dL Final    Magnesium  Date/Time Value Range Status  05/06/2013  4:44 AM 1.8  1.5 - 2.5 mg/dL Final  05/05/2013  3:30 AM 1.4* 1.5 - 2.5 mg/dL Final  05/02/2013  8:52 AM 1.4* 1.5 - 2.5 mg/dL Final     Height: Ht Readings from Last 1 Encounters:  05/03/13 5\' 4"  (1.626 m)     Weight Status:   Wt Readings from Last 1 Encounters:  05/05/13 140 lb 3.2 oz (63.594 kg)    Re-estimated needs:  Kcal: 1700-1900 Protein: 90-100 gm  Fluid: 1.9-2.1 L   Skin: intact   Diet Order: NPO   Intake/Output Summary (Last 24 hours) at 05/06/13 1444 Last data filed at 05/06/13 1139  Gross per 24 hour  Intake 770.67 ml  Output    400 ml  Net 370.67 ml    Last BM: 10/5   Labs:   Recent Labs Lab 05/02/13 0852  05/03/13 0630  05/04/13 1140 05/05/13 0330 05/06/13 0444  NA 128*  < >  --   < > 135 133* 133*  K 3.9  < >  --   < > 3.4* 4.1 4.8  CL 94*  < >  --   < > 101 100 100  CO2 21  < >  --   < > 25 24 24   BUN 21  < >  --   < > 6 5* 15  CREATININE 0.87  < >  --   < > 0.58 0.55 0.86  CALCIUM 8.8  < >  --   < > 8.4 9.2 9.6  MG 1.4*  --   --   --   --  1.4* 1.8  PHOS  --   --  3.4  --   --  3.7 4.3  GLUCOSE 174*  < >  --   < > 93 174* 119*  < > = values in this interval not displayed.  CBG (last 3)   Recent Labs  05/05/13 0018 05/06/13 0031 05/06/13 0421  GLUCAP 178* 215* 114*    Scheduled Meds: . enoxaparin (LOVENOX) injection  40 mg Subcutaneous Q24H  . feeding supplement (VITAL AF 1.2 CAL)  1,000 mL Per Tube Q24H  . insulin aspart  0-9 Units Subcutaneous Q4H  . insulin glargine  15 Units Subcutaneous Daily  . mometasone-formoterol  2 puff Inhalation BID  . multivitamin  5 mL Per Tube Daily  . pantoprazole sodium  40 mg Per Tube Daily  . vancomycin  1,000 mg Intravenous Q12H     Mikey College MS, RD, [place] [phone] Pager [phone] After Hours Pager

## 2013-05-06 NOTE — Progress Notes (Signed)
[facility] PROGRESS NOTE  SHENEA MUNSON [id number] DOB: [birth year] DOA: 05/01/2013 PCP: [name], MD HPI: [name] is a 42 y.o. female with with h/o DM, recently discharged from the hospital after being treated for similar complaints, comes back for intractable nausea and vomiting and reports the g tube came out of her mouth during an episode of emesis, but was able to swallow it back. abd film showed g tube in the esophagus. Her symptoms improved transiently in the ED. She was also found to be in mild DKA on admission. She was admitted to step down and started her on glucostabilizer. IR consulted for PEJ tube repositioning. Gi consulted for further management. Recommended surgical consultation to put another PEJ tube. Surgery consulted , recommended endoscopic placement of GJ tube vs stomach feeds with pro motility agents.  Assessment/Plan: 1. Intractable Nausea and vomiting: improved after IR put a G TUBE . Recommended endoscopic placement vs surgical placement of another PEJ tube.  - she was initially started on IV anti emetics, IV fluids and pain control as needed.   EUS is not scheduled we will go ahead and start her on g tube feeds. Her tube feds are going at 14ml/hr, and she has no residuals as per RN. We will continue to slowly increase the rate. She has nausea, but no vomiting and her abd pain is at baseline.  - lipase levels within normal limits.  - liver function tests within normal limits.  - appreciate GI and surgery input.  - nutrition consult to resume tube feeds at a low dose, and she reports slightly nauseated, pain is better controlled. No vomiting.   2. Mild DKA: Resolved.  - started her on sq lantus and SSI - HGBA1C is 10.2. - CBG (last 3)   Recent Labs  05/05/13 0018 05/06/13 0031 05/06/13 0421  GLUCAP 178* 215* 114*      3. Leukocytosis: chest film did not show any cardiopulmonary abnormalities. She is currently afebrile and non toxic looking.  Urine cultures show enterococcus infection sensitive to ampicilln, but she is allergic to penicillins, hence we started her on vancomycin and will change to macrobid on discharge.   4. Hyponatremia: possibly from emesis. Improving. Continue to monitor.   5. Hypokalemia and hypomagnesemia: is being repleted as needed.   6. DVT prophylaxis.   Code Status: full code Family Communication: none at bedside Disposition Plan: home when she is able to tolerate the feeds at 50 to 60ml/hr. Also pending PT evaluation.    Consultants:  GI  IR  SURGERY  Procedures:  G TUBE PLACEMENT.   Antibiotics:vancomycin 10/5 HPI/Subjective: She reports light nausea, no vomiting. Pain is better controlled.   Objective: Filed Vitals:   05/06/13 0423  BP: 152/98  Pulse: 99  Temp: 98.2 F (36.8 C)  Resp: 18    Intake/Output Summary (Last 24 hours) at 05/06/13 1339 Last data filed at 05/06/13 0700  Gross per 24 hour  Intake 670.67 ml  Output    400 ml  Net 270.67 ml   Filed Weights   05/03/13 0500 05/04/13 0503 05/05/13 0601  Weight: 65.772 kg (145 lb) 66.8 kg (147 lb 4.3 oz) 63.594 kg (140 lb 3.2 oz)    Exam:   General:  Afebrile comfortable, sleeping  Cardiovascular: s1s2   Respiratory: ctab  Abdomen: soft Mildly tender, non distended   Musculoskeletal: no pedal edema.   Data Reviewed: Basic Metabolic Panel:  Recent Labs Lab 05/02/13 0852  05/03/13 0630 05/03/13 1205 05/04/13  0100 05/04/13 1140 05/05/13 0330 05/06/13 0444  NA 128*  < >  --  133* 133* 135 133* 133*  K 3.9  < >  --  3.8 3.1* 3.4* 4.1 4.8  CL 94*  < >  --  98 100 101 100 100  CO2 21  < >  --  24 25 25 24 24   GLUCOSE 174*  < >  --  170* 199* 93 174* 119*  BUN 21  < >  --  12 8 6  5* 15  CREATININE 0.87  < >  --  0.73 0.63 0.58 0.55 0.86  CALCIUM 8.8  < >  --  8.3* 8.2* 8.4 9.2 9.6  MG 1.4*  --   --   --   --   --  1.4* 1.8  PHOS  --   --  3.4  --   --   --  3.7 4.3  < > = values in this interval  not displayed. Liver Function Tests:  Recent Labs Lab 05/01/13 0814  AST 31  ALT 23  ALKPHOS 159*  BILITOT 0.3  PROT 8.6*  ALBUMIN 3.5    Recent Labs Lab 05/01/13 0814  LIPASE 37  AMYLASE 78   No results found for this basename: AMMONIA,  in the last 168 hours CBC:  Recent Labs Lab 05/01/13 0908 05/02/13 0340 05/03/13 0630 05/06/13 0444  WBC 13.1* 13.7* 8.4 8.0  NEUTROABS 10.9*  --   --   --   HGB 14.1 12.2 10.5* 11.4*  HCT 39.4 34.7* 31.1* 34.4*  MCV 84.2 84.6 87.6 88.0  PLT 283 208 175 201   Cardiac Enzymes:  Recent Labs Lab 05/01/13 0814  TROPONINI <0.30   BNP (last 3 results) No results found for this basename: PROBNP,  in the last 8760 hours CBG:  Recent Labs Lab 05/04/13 1644 05/04/13 2045 05/05/13 0018 05/06/13 0031 05/06/13 0421  GLUCAP 136* 83  83 178* 215* 114*    Recent Results (from the past 240 hour(s))  MRSA PCR SCREENING     Status: None   Collection Time    05/01/13  2:07 PM      Result Value Range Status   MRSA by PCR NEGATIVE  NEGATIVE Final   Comment:            The GeneXpert MRSA Assay (FDA     approved for NASAL specimens     only), is one component of a     comprehensive MRSA colonization     surveillance program. It is not     intended to diagnose MRSA     infection nor to guide or     monitor treatment for     MRSA infections.     Performed at [facility]  URINE CULTURE     Status: None   Collection Time    05/02/13  2:49 PM      Result Value Range Status   Specimen Description URINE, CATHETERIZED   Final   Special Requests NONE   Final   Culture  Setup Time     Final   Value: 05/02/2013 21:04     Performed at [facility]     Final   Value: >=100,000 COLONIES/ML     Performed at [facility]   Culture     Final   Value: ENTEROCOCCUS SPECIES     LACTOBACILLUS SPECIES     Note: Standardized susceptibility testing for  this organism is not available.     Performed at  [facility]   Report Status 05/05/2013 FINAL   Final   Organism ID, Bacteria ENTEROCOCCUS SPECIES   Final     Studies: No results found.  Scheduled Meds: . enoxaparin (LOVENOX) injection  40 mg Subcutaneous Q24H  . feeding supplement (VITAL AF 1.2 CAL)  1,000 mL Per Tube Q24H  . insulin aspart  0-9 Units Subcutaneous Q4H  . insulin glargine  15 Units Subcutaneous Daily  . mometasone-formoterol  2 puff Inhalation BID  . multivitamin  5 mL Per Tube Daily  . pantoprazole sodium  40 mg Per Tube Daily  . vancomycin  1,000 mg Intravenous Q12H   Continuous Infusions:    Active Problems:   Protein-calorie malnutrition, severe    Time spent: 25 min    [facility] Pager [phone], please contact night-coverage at [email or web address], password [facility] 05/06/2013, 1:39 PM  LOS: 5 days

## 2013-05-06 NOTE — Progress Notes (Signed)
General Surgery [facility]  Discussed with Dr. Lucia Gaskins and Modena Jansky.  Will sign off.  Earnstine Regal, MD, [facility] Office: [phone]

## 2013-05-06 NOTE — Progress Notes (Signed)
  Subjective: Complains of chronic abdominal pain and "feels bloated."  No nausea or vomiting.  Objective: Vital signs in last 24 hours: Temp:  [98.1 F (36.7 C)-98.2 F (36.8 C)] 98.2 F (36.8 C) (10/06 0423) Pulse Rate:  [88-102] 99 (10/06 0423) Resp:  [17-24] 18 (10/06 0423) BP: (134-152)/(77-98) 152/98 mmHg (10/06 0423) SpO2:  [99 %-100 %] 99 % (10/06 0815) Last BM Date: 05/05/13 150 PO and 580 per feeding tube recorded yesterday. Afebrile, VSS, BP up some Labs OK Intake/Output from previous day: 10/05 0701 - 10/06 0700 In: 730.7 [NG/GT:580.7] Out: 1200 [Urine:1200] Intake/Output this shift:    General appearance: alert, cooperative and complains of chronic pain, grimaces with questions. GI: soft, non-tender; bowel sounds normal; no masses,  no organomegaly and BS  Lab Results:   Recent Labs  05/06/13 0444  WBC 8.0  HGB 11.4*  HCT 34.4*  PLT 201    BMET  Recent Labs  05/05/13 0330 05/06/13 0444  NA 133* 133*  K 4.1 4.8  CL 100 100  CO2 24 24  GLUCOSE 174* 119*  BUN 5* 15  CREATININE 0.55 0.86  CALCIUM 9.2 9.6   PT/INR No results found for this basename: LABPROT, INR,  in the last 72 hours   Recent Labs Lab 05/01/13 0814  AST 31  ALT 23  ALKPHOS 159*  BILITOT 0.3  PROT 8.6*  ALBUMIN 3.5     Lipase     Component Value Date/Time   LIPASE 37 05/01/2013 0814     Studies/Results: No results found.  Medications: . enoxaparin (LOVENOX) injection  40 mg Subcutaneous Q24H  . feeding supplement (VITAL AF 1.2 CAL)  1,000 mL Per Tube Q24H  . insulin aspart  0-9 Units Subcutaneous Q4H  . insulin glargine  15 Units Subcutaneous Daily  . metoCLOPramide (REGLAN) injection  5 mg Intravenous Q6H  . mometasone-formoterol  2 puff Inhalation BID  . multivitamin  5 mL Per Tube Daily  . pantoprazole (PROTONIX) IV  40 mg Intravenous Q24H  . potassium chloride  40 mEq Per Tube BID  . vancomycin  1,000 mg Intravenous Q12H    Assessment/Plan 1.  Intractable Nausea and vomiting, Dysphagia/achalasia, gastroparesis. S/p multiple Feeding tube placements.  2. Chronic narcotic use for abdominal pain/CHRONIC Pancreatitis  3. Diabetes insulin dependant  4. Hypertension  5. CAD/hx of prior MI  6. Seizures  7.Neuropaty  8.COPD   Plan:  Medical management, ongoing use of current feeding tube. No new recommendations  Call if we can assist.   LOS: 5 days    [name] 05/06/2013

## 2013-05-07 LAB — GLUCOSE, CAPILLARY
Glucose-Capillary: 146 mg/dL — ABNORMAL HIGH (ref 70–99)
Glucose-Capillary: 136 mg/dL — ABNORMAL HIGH (ref 70–99)
Glucose-Capillary: 193 mg/dL — ABNORMAL HIGH (ref 70–99)
Glucose-Capillary: 188 mg/dL — ABNORMAL HIGH (ref 70–99)
Glucose-Capillary: 201 mg/dL — ABNORMAL HIGH (ref 70–99)
Glucose-Capillary: 139 mg/dL — ABNORMAL HIGH (ref 70–99)
Glucose-Capillary: 143 mg/dL — ABNORMAL HIGH (ref 70–99)

## 2013-05-07 LAB — VANCOMYCIN, TROUGH: Vancomycin Tr: 21.8 ug/mL — ABNORMAL HIGH (ref 10.0–20.0)

## 2013-05-07 MED ORDER — ADULT MULTIVITAMIN LIQUID CH: 5.0000 mL | Freq: Every day | ORAL | Status: DC

## 2013-05-07 MED ORDER — PANTOPRAZOLE SODIUM 40 MG PO PACK: 40.0000 mg | PACK | Freq: Every day | ORAL | Status: DC

## 2013-05-07 MED ORDER — METOCLOPRAMIDE HCL 5 MG/5ML PO SOLN: 5.0000 mg | Freq: Four times a day (QID) | ORAL | Status: DC | PRN

## 2013-05-07 MED ORDER — FREE WATER: 60.0000 mL | Status: DC

## 2013-05-07 MED ORDER — VITAL AF 1.2 CAL PO LIQD
119.0000 mL | ORAL | Status: DC
  Filled 2013-05-07: qty 237

## 2013-05-07 MED ORDER — OXYCODONE HCL 5 MG/5ML PO SOLN: 2.5000 mg | Freq: Four times a day (QID) | ORAL | Status: DC | PRN

## 2013-05-07 MED ORDER — INSULIN GLARGINE 100 UNIT/ML ~~LOC~~ SOLN: 15.0000 [IU] | Freq: Every day | SUBCUTANEOUS | Status: DC

## 2013-05-07 MED ORDER — VITAL AF 1.2 CAL PO LIQD: 1000.0000 mL | ORAL | Status: DC

## 2013-05-07 MED ORDER — VITAL AF 1.2 CAL PO LIQD
1000.0000 mL | ORAL | Status: DC
  Administered 2013-05-07: 1000 mL
  Filled 2013-05-07: qty 1000

## 2013-05-07 MED ORDER — FOSFOMYCIN TROMETHAMINE 3 G PO PACK
3.0000 g | PACK | Freq: Once | ORAL | Status: AC
  Administered 2013-05-07: 3 g via ORAL
  Filled 2013-05-07: qty 3

## 2013-05-07 MED ORDER — VITAL AF 1.2 CAL PO LIQD: 119.0000 mL | ORAL | Status: DC

## 2013-05-07 MED ORDER — INSULIN ASPART 100 UNIT/ML ~~LOC~~ SOLN: 0.0000 [IU] | SUBCUTANEOUS | Status: DC

## 2013-05-07 NOTE — Progress Notes (Signed)
NUTRITION FOLLOW UP  Intervention:   - Per discussion with MD, plan is to increase TF of Vital AF 1.2 to 60ml/hr now and transition to bolus TF in the morning. Vital AF 1.2 at 79ml/hr will provide 1440 calories, 90g protein, 992ml free water (will meet 85% estimated calorie needs, 100% estimated protein needs).  - Starting tomorrow, will order 1/2 can of Vital AF 1.2 via G tube at 0800 and if pt tolerates this, will order 1 can every 4 hours. Goal at d/c would be for pt to get in 6 cans/day total of Vital AF 1.2 which will provide 1704 calories, 107g protein, 1175ml free water (will meet 100% estimated calorie needs, 119% estimated protein needs). Recommend 48ml water flushes before and after each bolus TF administration to meet fluid needs via G tube.  - Diet advancement per MD - Will continue to monitor    Nutrition Dx:   Inadequate oral intake related to inability to eat as evidenced by NPO. ongoing  Goal:   Resume TF as appropriate to meet >/=90% estimated nutrition needs. Progressing.   Monitor:   TF advance, weight trends/tolerance, labs  Assessment:   Pt getting Vital AF 1.2 at 52ml/hr - provides 1152 calories, 72g protein, 76ml free water meeting 68% estimated calorie needs, 80% estimated protein needs. Met with pt who continues to c/o ongoing nausea and dry heaves. States the only thing helping her nausea is Dilaudid. MD spoke with RD about plans to switch TF to bolus once pt able to tolerate Vital 1.2 AF at 52ml/hr. Discussed with pt who is agreeable to this plan. Pt's weight up 4 pounds since admission.    Height: Ht Readings from Last 1 Encounters:  05/03/13 5\' 4"  (1.626 m)    Weight Status:   Wt Readings from Last 1 Encounters:  05/07/13 142 lb 13.7 oz (64.8 kg)    Re-estimated needs:  Kcal: 1700-1900 Protein: 90-100 gm  Fluid: 1.9-2.1 L   Skin: intact   Diet Order: NPO   Intake/Output Summary (Last 24 hours) at 05/07/13 1420 Last data filed at 05/07/13 0600  Gross per 24 hour  Intake   1190 ml  Output      0 ml  Net   1190 ml    Last BM: 10/5   Labs:   Recent Labs Lab 05/02/13 0852  05/03/13 0630  05/04/13 1140 05/05/13 0330 05/06/13 0444  NA 128*  < >  --   < > 135 133* 133*  K 3.9  < >  --   < > 3.4* 4.1 4.8  CL 94*  < >  --   < > 101 100 100  CO2 21  < >  --   < > 25 24 24   BUN 21  < >  --   < > 6 5* 15  CREATININE 0.87  < >  --   < > 0.58 0.55 0.86  CALCIUM 8.8  < >  --   < > 8.4 9.2 9.6  MG 1.4*  --   --   --   --  1.4* 1.8  PHOS  --   --  3.4  --   --  3.7 4.3  GLUCOSE 174*  < >  --   < > 93 174* 119*  < > = values in this interval not displayed.  CBG (last 3)   Recent Labs  05/07/13 0334 05/07/13 0819 05/07/13 1135  GLUCAP 139* 147* 143*    Scheduled  Meds: . enoxaparin (LOVENOX) injection  40 mg Subcutaneous Q24H  . feeding supplement (VITAL AF 1.2 CAL)  1,000 mL Per Tube Q24H  . insulin aspart  0-9 Units Subcutaneous Q4H  . insulin glargine  15 Units Subcutaneous Daily  . mometasone-formoterol  2 puff Inhalation BID  . multivitamin  5 mL Per Tube Daily  . pantoprazole sodium  40 mg Per Tube Daily  . vancomycin  1,000 mg Intravenous Q12H     Mikey College MS, RD, [place] [phone] Pager [phone] After Hours Pager

## 2013-05-07 NOTE — Progress Notes (Signed)
Spoke with Ms Ficke at bedside again about [facility]. More receptive at this visit and states she will be willing to be followed by [facility]. Received referral on her months ago from [facility] program. Consents were signed in the recent past. Will follow up with patient post hospital discharge. She will receive a post hospital discharge call and will be evaluated for monthly home visits. Left brochure and contact information at bedside.  [name], MSN-Ed, RN, BSN- [facility] Liaison782 [phone]

## 2013-05-07 NOTE — Progress Notes (Signed)
ANTIBIOTIC CONSULT NOTE - Follow up  Pharmacy Consult for Vancomycin Indication: enterococcus UTI  Allergies  Allergen Reactions  . Penicillins Hives  . Cefadroxil     Not sure of effects-listed as allergy on list from home  . Compazine Hives    Tolerates promethazine  . Darvocet [Propoxyphene-Acetaminophen] Other (See Comments)    UNKNOWN  . Nsaids Hives  . Zofran [Ondansetron] Hives    Patient Measurements: Height: 5\' 4"  (162.6 cm) Weight: 142 lb 13.7 oz (64.8 kg) IBW/kg (Calculated) : 54.7  Vital Signs: Temp: 98.1 F (36.7 C) (10/07 0526) Temp src: Oral (10/07 0526) BP: 152/98 mmHg (10/07 0526) Pulse Rate: 105 (10/07 0526)  Labs:  Recent Labs  05/04/13 1140 05/05/13 0330 05/06/13 0444  WBC  --   --  8.0  HGB  --   --  11.4*  PLT  --   --  201  CREATININE 0.58 0.55 0.86   Estimated Creatinine Clearance: 72.8 ml/min (by C-G formula based on Cr of 0.86). No results found for this basename: VANCOTROUGH, VANCOPEAK, VANCORANDOM, GENTTROUGH, GENTPEAK, GENTRANDOM, TOBRATROUGH, TOBRAPEAK, TOBRARND, AMIKACINPEAK, AMIKACINTROU, AMIKACIN,  in the last 72 hours   Medical History: Past Medical History  Diagnosis Date  . Hypertension   . Diabetes mellitus   . Gastroparesis   . Asthma   . GERD (gastroesophageal reflux disease)   . Coronary artery disease   . h/o seizure   . Neuropathy   . Chronic pancreatitis   . Dyslipidemia   . MI (myocardial infarction)   . COPD (chronic obstructive pulmonary disease)   . 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 Patient with PCN allergy (hives) and admitted with intractable N/V.  MD would like IV abx for now then transition to oral therapy when N/V improves.   10/5 >> Vanc >>  Tmax: afeb WBCs: wnl Renal: Scr 0.55 >> 0.86, CrCl 72 ml/min CG   10/2 Urine >> enterococcus species (S - ampicillin, nitrofurantoin, vancomycin.  R- Levaquin, tetracycline)   Goal of  Therapy:  Vancomycin trough level 10-15 mcg/ml  Plan:  Continue Vancomycin 1gm IV q12h Measure Vanc trough at steady state, before 1600 dose today. Follow up renal fxn and culture results.  F/u improving in N/V and transition to PO (nitrofurantoin or fosfomycin)  Gretta Arab PharmD, BCPS Pager [phone] 05/07/2013 10:38 AM

## 2013-05-07 NOTE — Progress Notes (Addendum)
Pain management [facility] [phone] called again today. They will call the pt to make appointment, pt's name and telephone numbers left on VM. A call back from [facility] Center revealed that it is best that referral come from PCP.

## 2013-05-07 NOTE — Progress Notes (Signed)
Call to [facility] [phone] to make an appointment for pt. Left VM, waiting for return call to make appointment.

## 2013-05-07 NOTE — Evaluation (Signed)
Physical Therapy Evaluation Patient Details Name: [name] MRN: [id number] DOB: Sep 12, 1970 Today's Date: 05/07/2013 Time: [id number] PT Time Calculation (min): 25 min  PT Assessment / Plan / Recommendation History of Present Illness  [name] is a 42 y.o. female with with h/o DM, recently discharged from the hospital after being treated for similar complaints, comes back for intractable nausea and vomiting and reports the g tube came out of her mouth during an episode of emesis, but was able to swallow it back. abd film showed g tube in the esophagus. Her symptoms improved transiently in the ED. She was also found to be in mild DKA on admission. She was started on glucostabilizer in ED, and her  cbgs' improved as per EDP.  Hence she was initially admitted to med floor on SSI. After arrival to the floor, she continued to vomit, and her CBG'S remained elevated. She was later transferred to step down for further management. IR Consulted for repositioning the G tube. GI consulted for arrange for persistent symptoms, for continued outpatient follow up and for possible need to surgically reposition the PEJ tube.   Clinical Impression  Pt is up ad lib in room, holds onto IV pole but able to walk without. Pt does not require acute PT.    PT Assessment  Patent does not need any further PT services    Follow Up Recommendations  No PT follow up    Does the patient have the potential to tolerate intense rehabilitation      Barriers to Discharge        Equipment Recommendations  None recommended by PT    Recommendations for Other Services     Frequency      Precautions / Restrictions Precautions Precautions: None   Pertinent Vitals/Pain Frequent c/o abdominal pains.       Mobility  Transfers Transfers: Sit to Stand;Stand to Sit Sit to Stand: 7: Independent Stand to Sit: 7: Independent Ambulation/Gait Ambulation/Gait Assistance: 6: Modified independent (Device/Increase  time) Ambulation Distance (Feet): 100 Feet Ambulation/Gait Assistance Details: pt pushed IV pole. At times , stops and bends over due to pain. Gait Pattern: Step-through pattern Gait velocity: slow.    Exercises     PT Diagnosis:    PT Problem List:   PT Treatment Interventions:       PT Goals(Current goals can be found in the care plan section)    Visit Information  Last PT Received On: 05/07/13 History of Present Illness: [name] is a 42 y.o. female with with h/o DM, recently discharged from the hospital after being treated for similar complaints, comes back for intractable nausea and vomiting and reports the g tube came out of her mouth during an episode of emesis, but was able to swallow it back. abd film showed g tube in the esophagus. Her symptoms improved transiently in the ED. She was also found to be in mild DKA on admission. She was started on glucostabilizer in ED, and her  cbgs' improved as per EDP.  Hence she was initially admitted to med floor on SSI. After arrival to the floor, she continued to vomit, and her CBG'S remained elevated. She was later transferred to step down for further management. IR Consulted for repositioning the G tube. GI consulted for arrange for persistent symptoms, for continued outpatient follow up and for possible need to surgically reposition the PEJ tube.        Prior Functioning  Home Living Family/patient expects to be  discharged to:: Private residence Living Arrangements: Other relatives Available Help at Discharge: Family Type of Home: House Home Access: Level entry [place]: One [place]: [place] - single point Prior Function Level of Independence: Independent Communication Communication: No difficulties    Cognition  Cognition Arousal/Alertness: Awake/alert Behavior During Therapy: WFL for tasks assessed/performed Overall Cognitive Status: Within Functional Limits for tasks assessed    Extremity/Trunk  Assessment Upper Extremity Assessment Upper Extremity Assessment: Overall WFL for tasks assessed Lower Extremity Assessment Lower Extremity Assessment: Overall WFL for tasks assessed   Balance    End of Session PT - End of Session Activity Tolerance: Patient tolerated treatment well Patient left: in bed;with call bell/phone within reach  GP     Claretha Cooper 05/07/2013, 2:37 PM Tresa Endo PT [phone]

## 2013-05-07 NOTE — Discharge Summary (Signed)
Physician Discharge Summary  [name] [id number] DOB: 02-Jul-1971 DOA: 05/01/2013  PCP: [name], MD  Admit date: 05/01/2013 Discharge date: 05/07/2013  Time spent: 38 minutes  Recommendations for Outpatient Follow-up:  1. FOLLOW up with PCP in one week  2. Please get referral to pain clinic from PCP 3. FOLLOW up with BMP to check electrolytes  In one week.   Discharge Diagnoses:  Active Problems:   Protein-calorie malnutrition, severe intractable nausea and vomiting Diabetes mellitus Hypertension Hypokalemia G TUBE MALPOSITIONED.  MILD DKA  Discharge Condition: improved.   Diet recommendation: tube feeds as per Nutritionist recommendations.   Filed Weights   05/04/13 0503 05/05/13 0601 05/07/13 0526  Weight: 66.8 kg (147 lb 4.3 oz) 63.594 kg (140 lb 3.2 oz) 64.8 kg (142 lb 13.7 oz)    History of present illness:   [name] is a 42 y.o. female with with h/o DM, recently discharged from the hospital after being treated for similar complaints, comes back for intractable nausea and vomiting and reports the g tube came out of her mouth during an episode of emesis, but was able to swallow it back. abd film showed g tube in the esophagus. Her symptoms improved transiently in the ED. She was also found to be in mild DKA on admission. She was admitted to step down and started her on glucostabilizer. IR consulted for PEJ tube repositioning. Gi consulted for further management. Recommended surgical consultation to put another PEJ tube. Surgery consulted , recommended endoscopic placement of GJ tube vs stomach feeds with pro motility agents.  Assessment/Plan:  1. Intractable Nausea and vomiting: improved after IR put a G TUBE . Recommended endoscopic placement vs surgical placement of another PEJ tube.  - she was initially started on IV anti emetics, IV fluids and pain control as needed.  EUS is not scheduled we will go ahead and start her on g tube feeds. Her tube feds are  going at 92ml/hr, and she has no residuals as per RN. We will continue to slowly increase the rate. She has nausea, but no vomiting and her abd pain is at baseline.  - lipase levels within normal limits.  - liver function tests within normal limits.  - appreciate GI and surgery input.  - nutrition consult to resume tube feeds at a low dose, and she reports slightly nauseated, pain is better controlled. No vomiting. We changed from the continous feedings to small bolus feedings multiple times of the day.   2. Mild DKA: Resolved.  - started her on sq lantus and SSI  - HGBA1C is 10.2.  -  CBG (last 3)   Recent Labs   05/05/13 0018  05/06/13 0031  05/06/13 0421   GLUCAP  178*  215*  114*    3. Leukocytosis: chest film did not show any cardiopulmonary abnormalities. She is currently afebrile and non toxic looking. Urine cultures show enterococcus infection sensitive to ampicilln, but she is allergic to penicillins, hence we started her on vancomycin, she received 2 days and we will give her a dose of fosfomycin before discharge.  4. Hyponatremia: possibly from emesis. Improving.  5. Hypokalemia and hypomagnesemia: is being repleted as needed.     Procedures: G TUBE repositioning by IR  Consultations: GI  IR  SURGERY   Discharge Exam: Filed Vitals:   05/07/13 1437  BP: 146/93  Pulse: 101  Temp: 98 F (36.7 C)  Resp: 16    General: Afebrile comfortable, sleeping  Cardiovascular: s1s2  Respiratory: ctab  Abdomen: soft Mildly tender, non distended  Musculoskeletal: no pedal edema.    Discharge Instructions  Discharge Orders   Future Orders Complete By Expires   Discharge instructions  As directed    Comments:     Follow up withPCP in one week, referral to Pain clinic from PCP.       Medication List    STOP taking these medications       bisacodyl 10 MG suppository  Commonly known as:  DULCOLAX     insulin lispro 100 UNIT/ML injection  Commonly known as:   HUMALOG     multivitamin with minerals Tabs tablet     oxyCODONE 5 MG immediate release tablet  Commonly known as:  Oxy IR/ROXICODONE     pantoprazole 40 MG tablet  Commonly known as:  PROTONIX  Replaced by:  pantoprazole sodium 40 mg/20 mL Pack     potassium chloride 8 MEQ tablet  Commonly known as:  KLOR-CON     torsemide 10 MG tablet  Commonly known as:  DEMADEX      TAKE these medications       amLODipine 10 MG tablet  Commonly known as:  NORVASC  Place 1 tablet (10 mg total) into feeding tube daily.     atorvastatin 40 MG tablet  Commonly known as:  LIPITOR  Place 1 tablet (40 mg total) into feeding tube daily at 6 PM.     busPIRone 5 MG tablet  Commonly known as:  BUSPAR  Place 5 mg into feeding tube 3 (three) times daily.     feeding supplement (VITAL AF 1.2 CAL) Liqd  Place 119-237 mLs into feeding tube every 4 (four) hours.  Start taking on:  05/08/2013     Fluticasone-Salmeterol 250-50 MCG/DOSE Aepb  Commonly known as:  ADVAIR  Inhale 1 puff into the lungs 2 (two) times daily.     free water Soln  Place 60 mLs into feeding tube every 4 (four) hours.  Start taking on:  05/08/2013     gabapentin 300 MG capsule  Commonly known as:  NEURONTIN  Place 2 capsules (600 mg total) into feeding tube 3 (three) times daily.     insulin aspart 100 UNIT/ML injection  Commonly known as:  novoLOG  Inject 0-9 Units into the skin every 4 (four) hours.     insulin glargine 100 UNIT/ML injection  Commonly known as:  LANTUS  Inject 0.15 mLs (15 Units total) into the skin daily.     lisinopril 10 MG tablet  Commonly known as:  PRINIVIL,ZESTRIL  10 mg. 1 tablet (10 mg total) by Per J Tube route daily.     metoCLOPramide 5 MG/5ML solution  Commonly known as:  REGLAN  Take 5 mLs (5 mg total) by mouth every 6 (six) hours as needed.     multivitamin Liqd  Place 5 mLs into feeding tube daily.     pantoprazole sodium 40 mg/20 mL Pack  Commonly known as:  PROTONIX  Place  20 mLs (40 mg total) into feeding tube daily.     promethazine 12.5 MG suppository  Commonly known as:  PHENERGAN  Place 1 suppository (12.5 mg total) rectally every 6 (six) hours as needed for nausea.     sucralfate 1 GM/10ML suspension  Commonly known as:  CARAFATE  Place 10 mLs (1 g total) into feeding tube 4 (four) times daily -  with meals and at bedtime.     zolpidem 10 MG tablet  Commonly known as:  AMBIEN  Take 1 tablet (10 mg total) by mouth at bedtime as needed for sleep.       Allergies  Allergen Reactions  . Penicillins Hives  . Cefadroxil     Not sure of effects-listed as allergy on list from home  . Compazine Hives    Tolerates promethazine  . Darvocet [Propoxyphene-Acetaminophen] Other (See Comments)    UNKNOWN  . Nsaids Hives  . Zofran [Ondansetron] Hives      The results of significant diagnostics from this hospitalization (including imaging, microbiology, ancillary and laboratory) are listed below for reference.    Significant Diagnostic Studies: Ir Fluoro Guide Cv Line Right  05/03/2013   CLINICAL DATA:  Gastroparesis. Needs access for TPN.  EXAM: PICC PLACEMENT WITH ULTRASOUND AND FLUOROSCOPY  TECHNIQUE: After written informed consent was obtained, patient was placed in the supine position on angiographic table. Patency of the right basilic vein was confirmed with ultrasound with image documentation. An appropriate skin site was determined. Skin site was marked. Region was prepped using maximum barrier technique including cap and mask, sterile gown, sterile gloves, large sterile sheet, and Chlorhexidine as cutaneous antisepsis. The region was infiltrated locally with 1% lidocaine. Under real-time ultrasound guidance, the right basilic vein was accessed with a 21 gauge micropuncture needle; the needle tip within the vein was confirmed with ultrasound image documentation. Needle exchanged over a 018 guidewire for a peel-away sheath, through which a 5-French  double-lumen power injectable PICC trimmed to 40cm was advanced, positioned with its tip near the cavoatrial junction. Spot chest radiograph confirms appropriate catheter position. Catheter was flushed per protocol and secured externally with 0-Prolene sutures. The patient tolerated procedure well, with no immediate complication.  FLUOROSCOPY TIME:  Three seconds  IMPRESSION: Technically successful five [place] double lumen power injectable PICC placement   Electronically Signed   By: Arne Cleveland M.D.   On: 05/03/2013 15:34   Ir Fluoro Guide Cv Line Right  04/11/2013   *RADIOLOGY REPORT*  Clinical Data: Poor venous access.  Powered PICC LINE PLACEMENT WITH ULTRASOUND AND FLUOROSCOPIC GUIDANCE  Fluoroscopy Time: 0 minutes, 24 seconds  The right arm was prepped with chlorhexidine, draped in the usual sterile fashion using maximum barrier technique (cap and mask, sterile gown, sterile gloves, large sterile sheet, hand hygiene and cutaneous antisepsis) and infiltrated locally with 1% Lidocaine.  Ultrasound demonstrated patency of the right brachial vein, and this was documented with an image.  Under real-time ultrasound guidance, this vein was accessed with a 21 gauge micropuncture needle and image documentation was performed.  The needle was exchanged over a guidewire for a peel-away sheath through which a five [place] dual lumen PICC trimmed to 43 cm was advanced, positioned with its tip at the lower SVC/right atrial junction. Fluoroscopy during the procedure and fluoro spot radiograph confirms appropriate catheter position.  The catheter was flushed, secured to the skin with Prolene sutures, and covered with a sterile dressing.  Complications:  None  IMPRESSION: Successful right arm poweredPICC line placement with ultrasound and fluoroscopic guidance.  The catheter is ready for use.  Read by: Ascencion Dike, P.A,-C   Original Report Authenticated By: Marybelle Killings, M.D.   Ir Replc Gastro/colonic Tube Percut  W/fluoro  05/01/2013   CLINICAL DATA:  Malpositioned GJ tube. Jejunal tube has refluxed into the esophagus.  EXAM: GASTROSTOMY CATHETER REPLACEMENT  Physician: Stephan Minister. Henn, MD  MEDICATIONS: None  ANESTHESIA/SEDATION: Moderate sedation time: None  FLUOROSCOPY TIME:  10 min and  18 seconds  PROCEDURE: The anterior abdomen was prepped and draped in a sterile fashion. Maximal barrier sterile technique was utilized including caps, mask, sterile gowns, sterile gloves, sterile drape, hand hygiene and skin antiseptic. The existing tube was completely removed. A C2 catheter and Kumpe catheter were advanced into the stomach. Air was injected in the stomach to identify the distal aspect of the stomach and possibly the small bowel. Neither a catheter or wire could be advanced outside of the stomach. As a result, an 42 French balloon retention gastrostomy tube was placed. Balloon was inflated with 8 mL of saline.  COMPLICATIONS: None  FINDINGS: Unable to cannulate the distal stomach or small bowel. As result, a GJ tube could not be placed. A 18 French balloon retention gastrostomy tube was placed.  IMPRESSION: Unsuccessful attempt at replacing the [place] tube. A new gastrostomy tube was placed. Consider catheter placement with endoscopy.   Electronically Signed   By: Markus Daft M.D.   On: 05/01/2013 17:01   Ir Replc Duoden/jejuno Tube Percut W/fluoro  04/11/2013   *RADIOLOGY REPORT*  Clinical Data: Gastrojejunostomy tube looped in the esophagus.  IR REPLACE DUODEN/JEJUNO TUBE PERCUT WITH FLOURO  Procedure:  Initial imaging demonstrates that the gastrojejunal tube is looped in the esophagus.  It was exchanged over a glide wire for a Kumpe catheter.  Access into the duodenum was not possible.  The patient continue to vomit during the examination. The gastrojejunostomy tube was then reinserted over the glide wire and looped in the stomach.  5 ml saline was utilized to inflate the balloon.  It was secured in place.  Contrast was  injected.  Findings: The tip of the gastrojejunostomy tube was pulled from the esophagus and is now looped in the stomach.  IMPRESSION: Gastrojejunostomy tube was looped in the stomach.   Original Report Authenticated By: Marybelle Killings, M.D.   Ir US Guide Vasc Access Right  05/03/2013   CLINICAL DATA:  Gastroparesis. Needs access for TPN.  EXAM: PICC PLACEMENT WITH ULTRASOUND AND FLUOROSCOPY  TECHNIQUE: After written informed consent was obtained, patient was placed in the supine position on angiographic table. Patency of the right basilic vein was confirmed with ultrasound with image documentation. An appropriate skin site was determined. Skin site was marked. Region was prepped using maximum barrier technique including cap and mask, sterile gown, sterile gloves, large sterile sheet, and Chlorhexidine as cutaneous antisepsis. The region was infiltrated locally with 1% lidocaine. Under real-time ultrasound guidance, the right basilic vein was accessed with a 21 gauge micropuncture needle; the needle tip within the vein was confirmed with ultrasound image documentation. Needle exchanged over a 018 guidewire for a peel-away sheath, through which a 5-French double-lumen power injectable PICC trimmed to 40cm was advanced, positioned with its tip near the cavoatrial junction. Spot chest radiograph confirms appropriate catheter position. Catheter was flushed per protocol and secured externally with 0-Prolene sutures. The patient tolerated procedure well, with no immediate complication.  FLUOROSCOPY TIME:  Three seconds  IMPRESSION: Technically successful five [place] double lumen power injectable PICC placement   Electronically Signed   By: Arne Cleveland M.D.   On: 05/03/2013 15:34   Ir US Guide Vasc Access Right  04/11/2013   *RADIOLOGY REPORT*  Clinical Data: Poor venous access.  Powered PICC LINE PLACEMENT WITH ULTRASOUND AND FLUOROSCOPIC GUIDANCE  Fluoroscopy Time: 0 minutes, 24 seconds  The right arm was prepped  with chlorhexidine, draped in the usual sterile fashion using maximum barrier technique (cap and mask, sterile  gown, sterile gloves, large sterile sheet, hand hygiene and cutaneous antisepsis) and infiltrated locally with 1% Lidocaine.  Ultrasound demonstrated patency of the right brachial vein, and this was documented with an image.  Under real-time ultrasound guidance, this vein was accessed with a 21 gauge micropuncture needle and image documentation was performed.  The needle was exchanged over a guidewire for a peel-away sheath through which a five [place] dual lumen PICC trimmed to 43 cm was advanced, positioned with its tip at the lower SVC/right atrial junction. Fluoroscopy during the procedure and fluoro spot radiograph confirms appropriate catheter position.  The catheter was flushed, secured to the skin with Prolene sutures, and covered with a sterile dressing.  Complications:  None  IMPRESSION: Successful right arm poweredPICC line placement with ultrasound and fluoroscopic guidance.  The catheter is ready for use.  Read by: Ascencion Dike, P.A,-C   Original Report Authenticated By: Marybelle Killings, M.D.   Dg Abd Acute W/chest  05/01/2013   CLINICAL DATA:  Severe abdominal pain this morning, history pancreatitis, hypertension, diabetes, GERD, asthma, coronary artery disease post MI, COPD  EXAM: ACUTE ABDOMEN SERIES (ABDOMEN 2 VIEW & CHEST 1 VIEW)  COMPARISON:  04/11/2013  Correlation: CT abdomen 02/06/2013  FINDINGS: Normal heart size, mediastinal contours, and pulmonary vascularity.  Bronchitic changes without infiltrate, pleural effusion or pneumothorax.  Bones demineralized.  Normal bowel gas pattern.  No bowel dilatation, bowel wall thickening, or free intraperitoneal air.  Evidence of prior bowel surgery in the left upper quadrant.  Catheter projects over the stomach and mid/distal esophagus ; this was coiled within the stomach on a prior CT.  Post laminectomies of L4 and L5 with Ray cage fusion of  L4-L5.  Scattered pelvic phleboliths without urinary tract calcification.  IMPRESSION: No acute cardiopulmonary abnormalities.  Normal bowel gas pattern.  Catheter projects over the stomach and mid to distal esophagus ; prior CT suggests this is a gastrostomy tube, the tip of which is now within the esophagus rather than within the stomach, consider repositioning.   Electronically Signed   By: Lavonia Dana M.D.   On: 05/01/2013 10:08   Dg Abd Acute W/chest  04/11/2013   *RADIOLOGY REPORT*  Clinical Data: Upper abdominal pain and vomiting for 5 days.  ACUTE ABDOMEN SERIES (ABDOMEN 2 VIEW & CHEST 1 VIEW)  Comparison: 03/31/2013  Findings: Normal heart size and pulmonary vascularity.  No focal airspace consolidation in the lungs.  Mild peribronchial thickening and central interstitial changes likely representing chronic bronchitis.  No blunting of costophrenic angles.  No pneumothorax. There is a gastrostomy tube which extends up into the mid esophagus in the loops down work with tip in the left upper quadrant.  The course of the tube is stable since previous study.  Gas and stool in the colon.  No small or large bowel distension. No free intra-abdominal air.  No abnormal air fluid levels. Surgical clips in the right upper quadrant with bowel staples in the mid abdomen bilaterally.  Postoperative changes in the lower lumbar spine.  No radiopaque stones.  Calcified phleboliths in the pelvis.  IMPRESSION: No evidence of active pulmonary disease.  Chronic bronchitic changes.  Nonobstructive bowel gas pattern.  Malpositioned gastrostomy tube is stable since previous study.   Original Report Authenticated By: Lucienne Capers, M.D.    Microbiology: Recent Results (from the past 240 hour(s))  MRSA PCR SCREENING     Status: None   Collection Time    05/01/13  2:07 PM  Result Value Range Status   MRSA by PCR NEGATIVE  NEGATIVE Final   Comment:            The GeneXpert MRSA Assay (FDA     approved for NASAL  specimens     only), is one component of a     comprehensive MRSA colonization     surveillance program. It is not     intended to diagnose MRSA     infection nor to guide or     monitor treatment for     MRSA infections.     Performed at [facility]  URINE CULTURE     Status: None   Collection Time    05/02/13  2:49 PM      Result Value Range Status   Specimen Description URINE, CATHETERIZED   Final   Special Requests NONE   Final   Culture  Setup Time     Final   Value: 05/02/2013 21:04     Performed at [place]     Final   Value: >=100,000 COLONIES/ML     Performed at [facility]   Culture     Final   Value: ENTEROCOCCUS SPECIES     LACTOBACILLUS SPECIES     Note: Standardized susceptibility testing for this organism is not available.     Performed at [facility]   Report Status 05/05/2013 FINAL   Final   Organism ID, Bacteria ENTEROCOCCUS SPECIES   Final     Labs: Basic Metabolic Panel:  Recent Labs Lab 05/02/13 0852  05/03/13 0630 05/03/13 1205 05/04/13 0100 05/04/13 1140 05/05/13 0330 05/06/13 0444  NA 128*  < >  --  133* 133* 135 133* 133*  K 3.9  < >  --  3.8 3.1* 3.4* 4.1 4.8  CL 94*  < >  --  98 100 101 100 100  CO2 21  < >  --  24 25 25 24 24   GLUCOSE 174*  < >  --  170* 199* 93 174* 119*  BUN 21  < >  --  12 8 6  5* 15  CREATININE 0.87  < >  --  0.73 0.63 0.58 0.55 0.86  CALCIUM 8.8  < >  --  8.3* 8.2* 8.4 9.2 9.6  MG 1.4*  --   --   --   --   --  1.4* 1.8  PHOS  --   --  3.4  --   --   --  3.7 4.3  < > = values in this interval not displayed. Liver Function Tests:  Recent Labs Lab 05/01/13 0814  AST 31  ALT 23  ALKPHOS 159*  BILITOT 0.3  PROT 8.6*  ALBUMIN 3.5    Recent Labs Lab 05/01/13 0814  LIPASE 37  AMYLASE 78   No results found for this basename: AMMONIA,  in the last 168 hours CBC:  Recent Labs Lab 05/01/13 0908 05/02/13 0340 05/03/13 0630 05/06/13 0444  WBC 13.1*  13.7* 8.4 8.0  NEUTROABS 10.9*  --   --   --   HGB 14.1 12.2 10.5* 11.4*  HCT 39.4 34.7* 31.1* 34.4*  MCV 84.2 84.6 87.6 88.0  PLT 283 208 175 201   Cardiac Enzymes:  Recent Labs Lab 05/01/13 0814  TROPONINI <0.30   BNP: BNP (last 3 results) No results found for this basename: PROBNP,  in the last 8760 hours CBG:  Recent Labs Lab  05/06/13 2008 05/06/13 2351 05/07/13 0334 05/07/13 0819 05/07/13 1135  GLUCAP 124* 120* 139* 147* 143*       Signed:  Elisa Sorlie  [facility] 05/07/2013, 4:46 PM

## 2013-05-11 ENCOUNTER — Emergency Department (HOSPITAL_COMMUNITY): Payer: Medicare Other

## 2013-05-11 ENCOUNTER — Encounter (HOSPITAL_COMMUNITY): Payer: Self-pay | Admitting: Emergency Medicine

## 2013-05-11 ENCOUNTER — Emergency Department (HOSPITAL_COMMUNITY)
Admission: EM | Admit: 2013-05-11 | Discharge: 2013-05-11 | Disposition: A | Discharge status: Home / Self Care | Payer: Medicare Other | Attending: Emergency Medicine | Admitting: Emergency Medicine

## 2013-05-11 DIAGNOSIS — I252 Old myocardial infarction: Secondary | ICD-10-CM | POA: Insufficient documentation

## 2013-05-11 DIAGNOSIS — K219 Gastro-esophageal reflux disease without esophagitis: Secondary | ICD-10-CM | POA: Insufficient documentation

## 2013-05-11 DIAGNOSIS — Z79899 Other long term (current) drug therapy: Secondary | ICD-10-CM | POA: Insufficient documentation

## 2013-05-11 DIAGNOSIS — J4489 Other specified chronic obstructive pulmonary disease: Secondary | ICD-10-CM | POA: Insufficient documentation

## 2013-05-11 DIAGNOSIS — G40909 Epilepsy, unspecified, not intractable, without status epilepticus: Secondary | ICD-10-CM | POA: Insufficient documentation

## 2013-05-11 DIAGNOSIS — R000 Tachycardia, unspecified: Secondary | ICD-10-CM | POA: Insufficient documentation

## 2013-05-11 DIAGNOSIS — I10 Essential (primary) hypertension: Secondary | ICD-10-CM | POA: Insufficient documentation

## 2013-05-11 DIAGNOSIS — J449 Chronic obstructive pulmonary disease, unspecified: Secondary | ICD-10-CM | POA: Insufficient documentation

## 2013-05-11 DIAGNOSIS — R109 Unspecified abdominal pain: Secondary | ICD-10-CM

## 2013-05-11 DIAGNOSIS — E119 Type 2 diabetes mellitus without complications: Secondary | ICD-10-CM | POA: Insufficient documentation

## 2013-05-11 DIAGNOSIS — Z794 Long term (current) use of insulin: Secondary | ICD-10-CM | POA: Insufficient documentation

## 2013-05-11 DIAGNOSIS — R112 Nausea with vomiting, unspecified: Secondary | ICD-10-CM | POA: Insufficient documentation

## 2013-05-11 DIAGNOSIS — Z880 Allergy status to penicillin: Secondary | ICD-10-CM | POA: Insufficient documentation

## 2013-05-11 DIAGNOSIS — I2510 Atherosclerotic heart disease of native coronary artery without angina pectoris: Secondary | ICD-10-CM | POA: Insufficient documentation

## 2013-05-11 DIAGNOSIS — IMO0002 Reserved for concepts with insufficient information to code with codable children: Secondary | ICD-10-CM | POA: Insufficient documentation

## 2013-05-11 DIAGNOSIS — R1084 Generalized abdominal pain: Secondary | ICD-10-CM | POA: Insufficient documentation

## 2013-05-11 DIAGNOSIS — Z87891 Personal history of nicotine dependence: Secondary | ICD-10-CM | POA: Insufficient documentation

## 2013-05-11 DIAGNOSIS — R509 Fever, unspecified: Secondary | ICD-10-CM | POA: Insufficient documentation

## 2013-05-11 DIAGNOSIS — K3184 Gastroparesis: Secondary | ICD-10-CM | POA: Insufficient documentation

## 2013-05-11 DIAGNOSIS — E785 Hyperlipidemia, unspecified: Secondary | ICD-10-CM | POA: Insufficient documentation

## 2013-05-11 LAB — CBC
HCT: 34.2 % — ABNORMAL LOW (ref 36.0–46.0)
Hemoglobin: 11.9 g/dL — ABNORMAL LOW (ref 12.0–15.0)
RBC: 4.01 MIL/uL (ref 3.87–5.11)
WBC: 7 10*3/uL (ref 4.0–10.5)

## 2013-05-11 LAB — COMPREHENSIVE METABOLIC PANEL
Albumin: 2.9 g/dL — ABNORMAL LOW (ref 3.5–5.2)
Alkaline Phosphatase: 95 U/L (ref 39–117)
BUN: 11 mg/dL (ref 6–23)
CO2: 25 mEq/L (ref 19–32)
Chloride: 96 mEq/L (ref 96–112)
Creatinine, Ser: 0.92 mg/dL (ref 0.50–1.10)
GFR calc non Af Amer: 75 mL/min — ABNORMAL LOW (ref >90)
Glucose, Bld: 125 mg/dL — ABNORMAL HIGH (ref 70–99)
Potassium: 3.4 mEq/L — ABNORMAL LOW (ref 3.5–5.1)
Total Bilirubin: 0.3 mg/dL (ref 0.3–1.2)

## 2013-05-11 LAB — URINE MICROSCOPIC-ADD ON

## 2013-05-11 LAB — URINALYSIS, ROUTINE W REFLEX MICROSCOPIC
Bilirubin Urine: NEGATIVE
Ketones, ur: 15 mg/dL — AB
Leukocytes, UA: NEGATIVE
Nitrite: NEGATIVE
pH: 5.5 (ref 5.0–8.0)

## 2013-05-11 LAB — LIPASE, BLOOD: Lipase: 24 U/L (ref 11–59)

## 2013-05-11 MED ORDER — SODIUM CHLORIDE 0.9 % IV BOLUS (SEPSIS)
1000.0000 mL | Freq: Once | INTRAVENOUS | Status: AC
  Administered 2013-05-11: 1000 mL via INTRAVENOUS

## 2013-05-11 MED ORDER — METOCLOPRAMIDE HCL 5 MG/ML IJ SOLN
10.0000 mg | Freq: Once | INTRAMUSCULAR | Status: AC
  Administered 2013-05-11: 10 mg via INTRAVENOUS
  Filled 2013-05-11: qty 2

## 2013-05-11 MED ORDER — SODIUM CHLORIDE 0.9 % IJ SOLN: 10.0000 mL | INTRAMUSCULAR | Status: DC | PRN

## 2013-05-11 MED ORDER — HYDROMORPHONE HCL PF 1 MG/ML IJ SOLN
1.0000 mg | Freq: Once | INTRAMUSCULAR | Status: AC
  Administered 2013-05-11: 1 mg via INTRAVENOUS
  Filled 2013-05-11: qty 1

## 2013-05-11 MED ORDER — HYDROMORPHONE HCL PF 1 MG/ML IJ SOLN: 1.0000 mg | Freq: Once | INTRAMUSCULAR | Status: DC

## 2013-05-11 MED ORDER — PROMETHAZINE HCL 25 MG/ML IJ SOLN
25.0000 mg | Freq: Once | INTRAMUSCULAR | Status: AC
  Administered 2013-05-11: 25 mg via INTRAVENOUS
  Filled 2013-05-11: qty 1

## 2013-05-11 NOTE — ED Notes (Signed)
Patient medicated, see MAR VS updated and stable Patient and pt's family member deny further needs or concerns at this time Side rails up, call bell in reach

## 2013-05-11 NOTE — ED Provider Notes (Signed)
Medical screening examination/treatment/procedure(s) were conducted as a shared visit with non-physician practitioner(s) and myself.  I personally evaluated the patient during the encounter  [name] is a 42 y.o. female hx of gastroparesis, DM here with abdominal pain, vomiting. She has multiple visits for gastroparesis. Vomiting since yesterday and diffuse abdominal pain. She appeared dehydrated. I placed ultrasound guided IV. Mild generalized abdominal tenderness on exam with unremarkable KUB. Labs at baseline. After fluids, antiemetics, and pain meds, felt better. Able to tolerate fluids by mouth. Stable for d/c.   Angiocath insertion Performed by: Shirlyn Goltz  Consent: Verbal consent obtained. Risks and benefits: risks, benefits and alternatives were discussed Time out: Immediately prior to procedure a "time out" was called to verify the correct patient, procedure, equipment, support staff and site/side marked as required.  Preparation: Patient was prepped and draped in the usual sterile fashion.  Vein Location: L brachial  Ultrasound Guided  Gauge: 20 long angiocath   Normal blood return and flush without difficulty Patient tolerance: Patient tolerated the procedure well with no immediate complications.      Wandra Arthurs, MD 05/11/13 2104

## 2013-05-11 NOTE — ED Provider Notes (Signed)
CSN: [id number]     Arrival date & time 05/11/13  1651 History   First MD Initiated Contact with Patient 05/11/13 1656     Chief Complaint  Patient presents with  . Emesis  . Abdominal Pain   (Consider location/radiation/quality/duration/timing/severity/associated sxs/prior Treatment) HPI Comments: 42 year old female medical history of gastroparesis, diabetes, CAD, chronic pancreatitis, MI, COPD and hypertension presents to the emergency department complaining of abdominal pain, nausea and vomiting x1 day, worsening since she woke up this morning. Patient was recently admitted to the hospital on October 1 for the same, states when she was discharged she did not feel much better, began vomiting again this morning. Abdominal pain located in her mid epigastric region, nonradiating, described as burning rated 10 out of 10. She has tried taking oxycodone without relief. She eats through her feeding tube and feels as if the food is being "choked back up". Admits to associated fever of 100 earlier today with chills. Denies diarrhea, increased urinary frequency, urgency or dysuria, vaginal bleeding or discharge. No recent travel.  Patient is a 42 y.o. female presenting with vomiting and abdominal pain. The history is provided by the patient.  Emesis Associated symptoms: abdominal pain and chills   Associated symptoms: no diarrhea   Abdominal Pain Associated symptoms: chills, fever, nausea and vomiting   Associated symptoms: no chest pain, no diarrhea and no shortness of breath     Past Medical History  Diagnosis Date  . Hypertension   . Diabetes mellitus   . Gastroparesis   . Asthma   . GERD (gastroesophageal reflux disease)   . Coronary artery disease   . h/o seizure   . Neuropathy   . Chronic pancreatitis   . Dyslipidemia   . MI (myocardial infarction)   . COPD (chronic obstructive pulmonary disease)   . Seizures    Past Surgical History  Procedure Laterality Date  . Abdominal  hysterectomy    . Cholecystectomy    . Peg tube x 4      feeding jejunostomies with PEG tubes  . Cesarean section    . Colon resection due to diverticulitis    . Back surgery    . Esophagogastroduodenoscopy N/A 12/27/2012    Procedure: ESOPHAGOGASTRODUODENOSCOPY (EGD);  Surgeon: Missy Sabins, MD;  Location: [facility];  Service: Endoscopy;  Laterality: N/A;  . Esophagogastroduodenoscopy (egd) with esophageal dilation N/A 02/19/2013    Procedure: ESOPHAGOGASTRODUODENOSCOPY (EGD) WITH ESOPHAGEAL DILATION;  Surgeon: Wonda Horner, MD;  Location: [facility];  Service: Endoscopy;  Laterality: N/A;  . Balloon dilation N/A 02/19/2013    Procedure: BALLOON DILATION;  Surgeon: Wonda Horner, MD;  Location: [facility];  Service: Endoscopy;  Laterality: N/A;   Family History  Problem Relation Age of Onset  . Cancer Mother    History  Substance Use Topics  . Smoking status: Former Smoker -- 0.25 packs/day for 2 years    Types: Cigarettes  . Smokeless tobacco: Never Used  . Alcohol Use: No     Comment: hx 1 beer daily -pt states she quit Dec. 2012   OB History   Grav Para Term Preterm Abortions TAB SAB Ect Mult Living                 Review of Systems  Constitutional: Positive for fever, chills and appetite change.  Respiratory: Negative for shortness of breath.   Cardiovascular: Negative for chest pain.  Gastrointestinal: Positive for nausea, vomiting and abdominal pain. Negative for diarrhea.  Genitourinary: Negative.  Musculoskeletal: Negative for back pain.  All other systems reviewed and are negative.    Allergies  Penicillins; Cefadroxil; Compazine; Darvocet; Nsaids; and Zofran  Home Medications   Current Outpatient Rx  Name  Route  Sig  Dispense  Refill  . amLODipine (NORVASC) 10 MG tablet   Per Tube   Place 1 tablet (10 mg total) into feeding tube daily.   30 tablet   2   . atorvastatin (LIPITOR) 40 MG tablet   Per Tube   Place 1 tablet (40 mg total) into  feeding tube daily at 6 PM.   30 tablet   0   . busPIRone (BUSPAR) 5 MG tablet   Per Tube   Place 5 mg into feeding tube 3 (three) times daily.         . Fluticasone-Salmeterol (ADVAIR) 250-50 MCG/DOSE AEPB   Inhalation   Inhale 1 puff into the lungs 2 (two) times daily.   60 each   0   . gabapentin (NEURONTIN) 300 MG capsule   Per Tube   Place 2 capsules (600 mg total) into feeding tube 3 (three) times daily.   30 capsule   0   . insulin aspart (NOVOLOG) 100 UNIT/ML injection   Subcutaneous   Inject 0-10 Units into the skin every 4 (four) hours.         . insulin glargine (LANTUS) 100 UNIT/ML injection   Subcutaneous   Inject 0.15 mLs (15 Units total) into the skin daily.   10 mL   5   . lisinopril (PRINIVIL,ZESTRIL) 10 MG tablet      10 mg. 1 tablet (10 mg total) by Per J Tube route daily.         . metoCLOPramide (REGLAN) 5 MG/5ML solution   Oral   Take 5 mLs (5 mg total) by mouth every 6 (six) hours as needed.   120 mL   0   . Multiple Vitamin (MULTIVITAMIN) LIQD   Per Tube   Place 5 mLs into feeding tube daily.   60 mL   1   . Nutritional Supplements (FEEDING SUPPLEMENT, VITAL AF 1.2 CAL,) LIQD   Per Tube   Place 119-237 mLs into feeding tube every 4 (four) hours.   1500 mL   10   . oxyCODONE (ROXICODONE) 5 MG/5ML solution   Per Tube   Place 2.5 mLs (2.5 mg total) into feeding tube every 6 (six) hours as needed for pain.   50 mL   0   . pantoprazole sodium (PROTONIX) 40 mg/20 mL PACK   Per Tube   Place 20 mLs (40 mg total) into feeding tube daily.   30 each   1   . promethazine (PHENERGAN) 12.5 MG suppository   Rectal   Place 1 suppository (12.5 mg total) rectally every 6 (six) hours as needed for nausea.   30 each   0   . sucralfate (CARAFATE) 1 GM/10ML suspension   Per Tube   Place 10 mLs (1 g total) into feeding tube 4 (four) times daily -  with meals and at bedtime.   420 mL   0   . Water For Irrigation, Sterile (FREE WATER)  SOLN   Per Tube   Place 60 mLs into feeding tube every 4 (four) hours.         Marland Kitchen zolpidem (AMBIEN) 10 MG tablet   Tube   Give 10 mg by tube at bedtime as needed for sleep.  BP 146/96  Pulse 116  Temp(Src) 99.2 F (37.3 C) (Oral)  Resp 18  SpO2 99% Physical Exam  Nursing note and vitals reviewed. Constitutional: She is oriented to person, place, and time. She appears well-developed and well-nourished. She appears distressed.  HENT:  Head: Normocephalic and atraumatic.  Mouth/Throat: Oropharynx is clear and moist.  Eyes: Conjunctivae and EOM are normal. Pupils are equal, round, and reactive to light. No scleral icterus.  Neck: Normal range of motion. Neck supple.  Cardiovascular: Regular rhythm, normal heart sounds and intact distal pulses.  Tachycardia present.   Pulmonary/Chest: Effort normal and breath sounds normal. No respiratory distress.  Abdominal: Normal appearance. She exhibits no distension and no mass. Bowel sounds are increased. There is generalized tenderness. There is guarding. There is no rigidity, no rebound and no CVA tenderness.    No peritoneal signs.  Musculoskeletal: Normal range of motion. She exhibits no edema.  Neurological: She is alert and oriented to person, place, and time.  Skin: Skin is warm and dry. She is not diaphoretic.  Psychiatric: She has a normal mood and affect. Her behavior is normal.    ED Course  Procedures (including critical care time) Labs Review Labs Reviewed  CBC - Abnormal; Notable for the following:    Hemoglobin 11.9 (*)    HCT 34.2 (*)    All other components within normal limits  COMPREHENSIVE METABOLIC PANEL - Abnormal; Notable for the following:    Sodium 132 (*)    Potassium 3.4 (*)    Glucose, Bld 125 (*)    Albumin 2.9 (*)    GFR calc non Af Amer 75 (*)    GFR calc Af Amer 87 (*)    All other components within normal limits  URINALYSIS, ROUTINE W REFLEX MICROSCOPIC - Abnormal; Notable for the  following:    Hgb urine dipstick TRACE (*)    Ketones, ur 15 (*)    Protein, ur >300 (*)    All other components within normal limits  LIPASE, BLOOD  URINE MICROSCOPIC-ADD ON   Imaging Review No results found.  EKG Interpretation   None       MDM   1. Abdominal pain   2. Nausea and vomiting   3. Gastroparesis     Patient with abdominal pain, nausea and vomiting. History of gastroparesis, recent admission for the same. Patient appears distressed and uncomfortable, generalized abdominal tenderness. Abdomen is soft, there is guarding. Will give IV fluids, Dilaudid for pain, Reglan for nausea. Labs pending- CBC, CMP, lipase, urinalysis. Will obtain acute abdominal series to rule out any obstruction given her generalized tenderness to light palpation and guarding. 6:56 PM Acute abdominal series without any evidence of bowel obstruction or pneumoperitoneum. Patient reports no pain or nausea improvement with 1 mg Dilaudid and Reglan. Will give Phenergan and another dose of Dilaudid. 8:45 PM Patient reports some improvement of her abdominal pain with above regimen. She is no longer nauseated. PO fluid challenge, patient tolerated, however states she had a sensation of something stuck in her throat. No vomiting. No leukocytosis. She feels well enough to go home, she is stable for discharge. Vitals remained stable. Has Phenergan at home for nausea. Return precautions discussed. Patient states understanding of plan and is agreeable.  Illene Labrador, PA-C 05/11/13 2047

## 2013-05-11 NOTE — ED Notes (Signed)
Patient tolerating PO without issue Patient ready for DC

## 2013-05-11 NOTE — ED Notes (Signed)
Pt is aware of the need for a urine sample. Pt ambulated to the restroom in attempt to provide one, however was unable to void at this time.

## 2013-05-11 NOTE — ED Notes (Signed)
PA at bedside Per PA, patient to have PO challenge

## 2013-05-11 NOTE — ED Notes (Signed)
Pt states she woke up this morning with n/v abd pain and chills. Has not taken anything to relieve symptoms.

## 2013-05-13 ENCOUNTER — Encounter (HOSPITAL_COMMUNITY): Payer: Self-pay | Admitting: Emergency Medicine

## 2013-05-13 ENCOUNTER — Emergency Department (HOSPITAL_COMMUNITY)
Admission: EM | Admit: 2013-05-13 | Discharge: 2013-05-13 | Disposition: A | Discharge status: Home / Self Care | Payer: Medicare Other | Attending: Emergency Medicine | Admitting: Emergency Medicine

## 2013-05-13 DIAGNOSIS — R112 Nausea with vomiting, unspecified: Secondary | ICD-10-CM | POA: Insufficient documentation

## 2013-05-13 DIAGNOSIS — I2510 Atherosclerotic heart disease of native coronary artery without angina pectoris: Secondary | ICD-10-CM | POA: Insufficient documentation

## 2013-05-13 DIAGNOSIS — E109 Type 1 diabetes mellitus without complications: Secondary | ICD-10-CM | POA: Insufficient documentation

## 2013-05-13 DIAGNOSIS — G8929 Other chronic pain: Secondary | ICD-10-CM

## 2013-05-13 DIAGNOSIS — E785 Hyperlipidemia, unspecified: Secondary | ICD-10-CM | POA: Insufficient documentation

## 2013-05-13 DIAGNOSIS — IMO0002 Reserved for concepts with insufficient information to code with codable children: Secondary | ICD-10-CM | POA: Insufficient documentation

## 2013-05-13 DIAGNOSIS — Z87891 Personal history of nicotine dependence: Secondary | ICD-10-CM | POA: Insufficient documentation

## 2013-05-13 DIAGNOSIS — Z8669 Personal history of other diseases of the nervous system and sense organs: Secondary | ICD-10-CM | POA: Insufficient documentation

## 2013-05-13 DIAGNOSIS — J4489 Other specified chronic obstructive pulmonary disease: Secondary | ICD-10-CM | POA: Insufficient documentation

## 2013-05-13 DIAGNOSIS — I10 Essential (primary) hypertension: Secondary | ICD-10-CM | POA: Insufficient documentation

## 2013-05-13 DIAGNOSIS — J449 Chronic obstructive pulmonary disease, unspecified: Secondary | ICD-10-CM | POA: Insufficient documentation

## 2013-05-13 DIAGNOSIS — Z794 Long term (current) use of insulin: Secondary | ICD-10-CM | POA: Insufficient documentation

## 2013-05-13 DIAGNOSIS — K219 Gastro-esophageal reflux disease without esophagitis: Secondary | ICD-10-CM | POA: Insufficient documentation

## 2013-05-13 DIAGNOSIS — Z79899 Other long term (current) drug therapy: Secondary | ICD-10-CM | POA: Insufficient documentation

## 2013-05-13 DIAGNOSIS — Z880 Allergy status to penicillin: Secondary | ICD-10-CM | POA: Insufficient documentation

## 2013-05-13 DIAGNOSIS — I252 Old myocardial infarction: Secondary | ICD-10-CM | POA: Insufficient documentation

## 2013-05-13 DIAGNOSIS — K3184 Gastroparesis: Secondary | ICD-10-CM

## 2013-05-13 LAB — URINALYSIS, ROUTINE W REFLEX MICROSCOPIC
Bilirubin Urine: NEGATIVE
Glucose, UA: >1000 mg/dL — AB
Ketones, ur: 15 mg/dL — AB
Leukocytes, UA: NEGATIVE
Specific Gravity, Urine: 1.021 (ref 1.005–1.030)
Urobilinogen, UA: 0.2 mg/dL (ref 0.0–1.0)
pH: 6 (ref 5.0–8.0)

## 2013-05-13 LAB — CBC WITH DIFFERENTIAL/PLATELET
Basophils Absolute: 0 10*3/uL (ref 0.0–0.1)
HCT: 36.4 % (ref 36.0–46.0)
Hemoglobin: 13 g/dL (ref 12.0–15.0)
Lymphocytes Relative: 22 % (ref 12–46)
Lymphs Abs: 2.7 10*3/uL (ref 0.7–4.0)
MCHC: 35.7 g/dL (ref 30.0–36.0)
Monocytes Absolute: 0.6 10*3/uL (ref 0.1–1.0)
Monocytes Relative: 5 % (ref 3–12)
Neutro Abs: 8.8 10*3/uL — ABNORMAL HIGH (ref 1.7–7.7)
Neutrophils Relative %: 73 % (ref 43–77)
RBC: 4.34 MIL/uL (ref 3.87–5.11)
WBC: 12.1 10*3/uL — ABNORMAL HIGH (ref 4.0–10.5)

## 2013-05-13 LAB — URINE MICROSCOPIC-ADD ON

## 2013-05-13 LAB — COMPREHENSIVE METABOLIC PANEL
Alkaline Phosphatase: 104 U/L (ref 39–117)
BUN: 12 mg/dL (ref 6–23)
CO2: 25 mEq/L (ref 19–32)
Chloride: 92 mEq/L — ABNORMAL LOW (ref 96–112)
Creatinine, Ser: 0.88 mg/dL (ref 0.50–1.10)
GFR calc Af Amer: >90 mL/min (ref >90)
GFR calc non Af Amer: 79 mL/min — ABNORMAL LOW (ref >90)
Glucose, Bld: 310 mg/dL — ABNORMAL HIGH (ref 70–99)
Potassium: 3.2 mEq/L — ABNORMAL LOW (ref 3.5–5.1)
Total Bilirubin: 0.3 mg/dL (ref 0.3–1.2)

## 2013-05-13 LAB — TYPE AND SCREEN
ABO/RH(D): O POS
Antibody Screen: NEGATIVE

## 2013-05-13 LAB — LIPASE, BLOOD: Lipase: 29 U/L (ref 11–59)

## 2013-05-13 LAB — GLUCOSE, CAPILLARY: Glucose-Capillary: 296 mg/dL — ABNORMAL HIGH (ref 70–99)

## 2013-05-13 MED ORDER — SODIUM CHLORIDE 0.9 % IV SOLN: 1000.0000 mL | INTRAVENOUS | Status: DC

## 2013-05-13 MED ORDER — METOCLOPRAMIDE HCL 5 MG/5ML PO SOLN: 5.0000 mg | Freq: Four times a day (QID) | ORAL | Status: DC | PRN

## 2013-05-13 MED ORDER — LORAZEPAM 2 MG/ML IJ SOLN
1.0000 mg | Freq: Once | INTRAMUSCULAR | Status: AC
  Administered 2013-05-13: 1 mg via INTRAVENOUS
  Filled 2013-05-13: qty 1

## 2013-05-13 MED ORDER — SODIUM CHLORIDE 0.9 % IV SOLN
1000.0000 mL | Freq: Once | INTRAVENOUS | Status: AC
  Administered 2013-05-13: 1000 mL via INTRAVENOUS

## 2013-05-13 MED ORDER — METOCLOPRAMIDE HCL 5 MG/ML IJ SOLN
10.0000 mg | Freq: Once | INTRAMUSCULAR | Status: AC
  Administered 2013-05-13: 10 mg via INTRAVENOUS
  Filled 2013-05-13: qty 2

## 2013-05-13 MED ORDER — SUCRALFATE 1 GM/10ML PO SUSP: 1.0000 g | Freq: Three times a day (TID) | ORAL | Status: DC

## 2013-05-13 MED ORDER — PROMETHAZINE HCL 12.5 MG RE SUPP: 12.5000 mg | Freq: Four times a day (QID) | RECTAL | Status: DC | PRN

## 2013-05-13 MED ORDER — FENTANYL CITRATE 0.05 MG/ML IJ SOLN
100.0000 ug | Freq: Once | INTRAMUSCULAR | Status: AC
  Administered 2013-05-13: 100 ug via INTRAVENOUS
  Filled 2013-05-13: qty 2

## 2013-05-13 NOTE — ED Provider Notes (Signed)
CSN: [id number]     Arrival date & time 05/13/13  [id number] History   First MD Initiated Contact with Patient 05/13/13 [id number]     Chief Complaint  Patient presents with  . gastroparesis   . Hyperglycemia    Patient is a 42 y.o. female presenting with hyperglycemia.  Hyperglycemia  patient is a history of gastroparesis and type 1 diabetes.  She's had frequent visits to this emergency department as well as [facility] for recurrent nausea vomiting and abdominal discomfort.  Her last hospitalization at [facility] results of the discharge on October 10th.  During her hospitalization [facility] she was seen by gastroenterology at that time she is complaining of nausea vomiting and blood in her vomit.  They did not proceed with endoscopy and she had no more vomiting episodes while in the hospital for 48 hours.  Her hemoglobin remained stable.  At that time they recommended decreasing her narcotics as they felt as though her narcotics were likely leading to ileus associated nausea and vomiting.  She then came to the emergency department here in this system on October 11 where she was seen and evaluated with labs and treat with IV fluids, IV Dilaudid, IV Phenergan with improvement in her symptoms then returned home.  She states she's had ongoing nausea and vomiting reports ongoing blood-streaked vomit since that time.  She presents to the emergency room with a heart rate of 127.  She reports her pain is severe at this time.  She denies use of pain medications at home.  She's been referred to pain management but does not have an appointment to this point.  She states the only thing that helps her pain is Dilaudid and Phenergan when she gets like this.  No fevers or chills.  No melena or hematochezia.  No diarrhea.  She reports her upper abdominal pain is severe.  Past Medical History  Diagnosis Date  . Hypertension   . Diabetes mellitus   . Gastroparesis   . Asthma   . GERD  (gastroesophageal reflux disease)   . Coronary artery disease   . h/o seizure   . Neuropathy   . Chronic pancreatitis   . Dyslipidemia   . MI (myocardial infarction)   . COPD (chronic obstructive pulmonary disease)   . Seizures    Past Surgical History  Procedure Laterality Date  . Abdominal hysterectomy    . Cholecystectomy    . Peg tube x 4      feeding jejunostomies with PEG tubes  . Cesarean section    . Colon resection due to diverticulitis    . Back surgery    . Esophagogastroduodenoscopy N/A 12/27/2012    Procedure: ESOPHAGOGASTRODUODENOSCOPY (EGD);  Surgeon: Missy Sabins, MD;  Location: [facility];  Service: Endoscopy;  Laterality: N/A;  . Esophagogastroduodenoscopy (egd) with esophageal dilation N/A 02/19/2013    Procedure: ESOPHAGOGASTRODUODENOSCOPY (EGD) WITH ESOPHAGEAL DILATION;  Surgeon: Wonda Horner, MD;  Location: [facility];  Service: Endoscopy;  Laterality: N/A;  . Balloon dilation N/A 02/19/2013    Procedure: BALLOON DILATION;  Surgeon: Wonda Horner, MD;  Location: [facility];  Service: Endoscopy;  Laterality: N/A;   Family History  Problem Relation Age of Onset  . Cancer Mother    History  Substance Use Topics  . Smoking status: Former Smoker -- 0.25 packs/day for 2 years    Types: Cigarettes  . Smokeless tobacco: Never Used  . Alcohol Use: No  Comment: hx 1 beer daily -pt states she quit Dec. 2012   OB History   Grav Para Term Preterm Abortions TAB SAB Ect Mult Living                 Review of Systems  All other systems reviewed and are negative.    Allergies  Penicillins; Cefadroxil; Compazine; Darvocet; Nsaids; and Zofran  Home Medications   Current Outpatient Rx  Name  Route  Sig  Dispense  Refill  . amLODipine (NORVASC) 10 MG tablet   Per Tube   Place 1 tablet (10 mg total) into feeding tube daily.   30 tablet   2   . atorvastatin (LIPITOR) 40 MG tablet   Per Tube   Place 1 tablet (40 mg total) into feeding tube daily at 6  PM.   30 tablet   0   . busPIRone (BUSPAR) 5 MG tablet   Per Tube   Place 5 mg into feeding tube 3 (three) times daily.         . Fluticasone-Salmeterol (ADVAIR) 250-50 MCG/DOSE AEPB   Inhalation   Inhale 1 puff into the lungs 2 (two) times daily.   60 each   0   . gabapentin (NEURONTIN) 300 MG capsule   Per Tube   Place 2 capsules (600 mg total) into feeding tube 3 (three) times daily.   30 capsule   0   . insulin aspart (NOVOLOG) 100 UNIT/ML injection   Subcutaneous   Inject 0-10 Units into the skin every 4 (four) hours.         . insulin glargine (LANTUS) 100 UNIT/ML injection   Subcutaneous   Inject 0.15 mLs (15 Units total) into the skin daily.   10 mL   5   . lisinopril (PRINIVIL,ZESTRIL) 10 MG tablet      10 mg. 1 tablet (10 mg total) by Per J Tube route daily.         . metoCLOPramide (REGLAN) 5 MG/5ML solution   Oral   Take 5 mLs (5 mg total) by mouth every 6 (six) hours as needed.   120 mL   0   . Multiple Vitamin (MULTIVITAMIN) LIQD   Per Tube   Place 5 mLs into feeding tube daily.   60 mL   1   . Nutritional Supplements (FEEDING SUPPLEMENT, VITAL AF 1.2 CAL,) LIQD   Per Tube   Place 119-237 mLs into feeding tube every 4 (four) hours.   1500 mL   10   . oxyCODONE (ROXICODONE) 5 MG/5ML solution   Per Tube   Place 2.5 mLs (2.5 mg total) into feeding tube every 6 (six) hours as needed for pain.   50 mL   0   . pantoprazole sodium (PROTONIX) 40 mg/20 mL PACK   Per Tube   Place 20 mLs (40 mg total) into feeding tube daily.   30 each   1   . promethazine (PHENERGAN) 12.5 MG suppository   Rectal   Place 1 suppository (12.5 mg total) rectally every 6 (six) hours as needed for nausea.   30 each   0   . sucralfate (CARAFATE) 1 GM/10ML suspension   Per Tube   Place 10 mLs (1 g total) into feeding tube 4 (four) times daily -  with meals and at bedtime.   420 mL   0   . Water For Irrigation, Sterile (FREE WATER) SOLN   Per Tube   Place  60 mLs into  feeding tube every 4 (four) hours.         Marland Kitchen zolpidem (AMBIEN) 10 MG tablet   Tube   Give 10 mg by tube at bedtime as needed for sleep.          BP 161/97  Pulse 127  Temp(Src) 98.9 F (37.2 C) (Oral)  Resp 20  SpO2 100% Physical Exam  Nursing note and vitals reviewed. Constitutional: She is oriented to person, place, and time. She appears well-developed and well-nourished. No distress.  HENT:  Head: Normocephalic and atraumatic.  Eyes: EOM are normal.  Neck: Normal range of motion.  Cardiovascular: Regular rhythm and normal heart sounds.   tachy  Pulmonary/Chest: Effort normal and breath sounds normal.  Abdominal: Soft. She exhibits no distension. There is no tenderness.  Musculoskeletal: Normal range of motion.  Neurological: She is alert and oriented to person, place, and time.  Skin: Skin is warm and dry.  Psychiatric: She has a normal mood and affect. Judgment normal.    ED Course  Procedures (including critical care time) Labs Review Labs Reviewed  CBC WITH DIFFERENTIAL - Abnormal; Notable for the following:    WBC 12.1 (*)    Platelets 406 (*)    Neutro Abs 8.8 (*)    All other components within normal limits  COMPREHENSIVE METABOLIC PANEL - Abnormal; Notable for the following:    Sodium 130 (*)    Potassium 3.2 (*)    Chloride 92 (*)    Glucose, Bld 310 (*)    Albumin 3.4 (*)    GFR calc non Af Amer 79 (*)    All other components within normal limits  URINALYSIS, ROUTINE W REFLEX MICROSCOPIC - Abnormal; Notable for the following:    Glucose, UA >1000 (*)    Hgb urine dipstick SMALL (*)    Ketones, ur 15 (*)    Protein, ur >300 (*)    All other components within normal limits  GLUCOSE, CAPILLARY - Abnormal; Notable for the following:    Glucose-Capillary 296 (*)    All other components within normal limits  URINE MICROSCOPIC-ADD ON - Abnormal; Notable for the following:    Casts HYALINE CASTS (*)    All other components within normal  limits  LIPASE, BLOOD  HCG, SERUM, QUALITATIVE  TYPE AND SCREEN  ABO/RH   Imaging Review  I reviewed available ER/hospitalization records through the EMR   EKG Interpretation   None       MDM   1. Chronic abdominal pain   2. Gastroparesis    10:49 AM Patient's heart rate is down to 98 at this time.  She's been hydrated in the emergency department.  She has medications at home to help control her symptoms.  I recommended close followup with her gastroenterologist in [facility] [name].  She understands to return to the ER for new or worsening symptoms.  She has a G-tube and therefore she can give herself fluids and hydration through her G-tube.  Abdomen is soft nontender.  Her hemoglobin is 13 and this is improved from her prior hemoglobin.  She reported blood in her vomit when she was hospitalized at [facility] as well as his had no change in hemoglobin since then.  She's had no vomiting in the emergency department.  When her gastroenterology team saw her several days ago they did not want an endoscopy at that time.  I continue to believe that she does not need an endoscopy.  She may have a small  Mallory-Weiss tear    Hoy Morn, MD 05/13/13 1050

## 2013-05-13 NOTE — ED Notes (Signed)
Bed: [id number] Expected date:  Expected time:  Means of arrival:  Comments: ems- 42 yo, hx of gastroparesis, vomiting blood, HR 130

## 2013-05-13 NOTE — ED Notes (Signed)
Per ems pt is from home. Pt reported she started vomiting dark red blood yesterday, dark in nature with clots. Sharp abdominal pain 10/10. Refused to let EMS start IV.

## 2013-05-27 ENCOUNTER — Inpatient Hospital Stay (HOSPITAL_COMMUNITY)
Admission: EM | Admit: 2013-05-27 | Discharge: 2013-05-29 | DRG: 073 | Disposition: A | Discharge status: Other Acute Inpatient Hospital | Payer: Medicare Other | Attending: Internal Medicine | Admitting: Internal Medicine

## 2013-05-27 ENCOUNTER — Observation Stay (HOSPITAL_COMMUNITY): Payer: Medicare Other

## 2013-05-27 ENCOUNTER — Encounter (HOSPITAL_COMMUNITY): Payer: Self-pay | Admitting: Emergency Medicine

## 2013-05-27 ENCOUNTER — Emergency Department (HOSPITAL_COMMUNITY): Payer: Medicare Other

## 2013-05-27 DIAGNOSIS — Z87891 Personal history of nicotine dependence: Secondary | ICD-10-CM

## 2013-05-27 DIAGNOSIS — Z431 Encounter for attention to gastrostomy: Secondary | ICD-10-CM

## 2013-05-27 DIAGNOSIS — IMO0002 Reserved for concepts with insufficient information to code with codable children: Secondary | ICD-10-CM

## 2013-05-27 DIAGNOSIS — R1013 Epigastric pain: Secondary | ICD-10-CM | POA: Diagnosis present

## 2013-05-27 DIAGNOSIS — Z87898 Personal history of other specified conditions: Secondary | ICD-10-CM

## 2013-05-27 DIAGNOSIS — E878 Other disorders of electrolyte and fluid balance, not elsewhere classified: Secondary | ICD-10-CM | POA: Diagnosis present

## 2013-05-27 DIAGNOSIS — J449 Chronic obstructive pulmonary disease, unspecified: Secondary | ICD-10-CM | POA: Diagnosis present

## 2013-05-27 DIAGNOSIS — R4702 Dysphasia: Secondary | ICD-10-CM

## 2013-05-27 DIAGNOSIS — R109 Unspecified abdominal pain: Secondary | ICD-10-CM

## 2013-05-27 DIAGNOSIS — E871 Hypo-osmolality and hyponatremia: Secondary | ICD-10-CM

## 2013-05-27 DIAGNOSIS — I252 Old myocardial infarction: Secondary | ICD-10-CM

## 2013-05-27 DIAGNOSIS — E785 Hyperlipidemia, unspecified: Secondary | ICD-10-CM | POA: Diagnosis present

## 2013-05-27 DIAGNOSIS — E1140 Type 2 diabetes mellitus with diabetic neuropathy, unspecified: Secondary | ICD-10-CM

## 2013-05-27 DIAGNOSIS — K5900 Constipation, unspecified: Secondary | ICD-10-CM

## 2013-05-27 DIAGNOSIS — Z87440 Personal history of urinary (tract) infections: Secondary | ICD-10-CM

## 2013-05-27 DIAGNOSIS — D649 Anemia, unspecified: Secondary | ICD-10-CM | POA: Diagnosis present

## 2013-05-27 DIAGNOSIS — R197 Diarrhea, unspecified: Secondary | ICD-10-CM | POA: Diagnosis present

## 2013-05-27 DIAGNOSIS — F411 Generalized anxiety disorder: Secondary | ICD-10-CM | POA: Diagnosis present

## 2013-05-27 DIAGNOSIS — I2510 Atherosclerotic heart disease of native coronary artery without angina pectoris: Secondary | ICD-10-CM | POA: Diagnosis present

## 2013-05-27 DIAGNOSIS — K567 Ileus, unspecified: Secondary | ICD-10-CM

## 2013-05-27 DIAGNOSIS — D72829 Elevated white blood cell count, unspecified: Secondary | ICD-10-CM | POA: Diagnosis present

## 2013-05-27 DIAGNOSIS — E43 Unspecified severe protein-calorie malnutrition: Secondary | ICD-10-CM

## 2013-05-27 DIAGNOSIS — E876 Hypokalemia: Secondary | ICD-10-CM

## 2013-05-27 DIAGNOSIS — I10 Essential (primary) hypertension: Secondary | ICD-10-CM | POA: Diagnosis present

## 2013-05-27 DIAGNOSIS — Z79899 Other long term (current) drug therapy: Secondary | ICD-10-CM

## 2013-05-27 DIAGNOSIS — K920 Hematemesis: Secondary | ICD-10-CM

## 2013-05-27 DIAGNOSIS — K219 Gastro-esophageal reflux disease without esophagitis: Secondary | ICD-10-CM

## 2013-05-27 DIAGNOSIS — K861 Other chronic pancreatitis: Secondary | ICD-10-CM | POA: Diagnosis present

## 2013-05-27 DIAGNOSIS — Z931 Gastrostomy status: Secondary | ICD-10-CM

## 2013-05-27 DIAGNOSIS — E1165 Type 2 diabetes mellitus with hyperglycemia: Secondary | ICD-10-CM

## 2013-05-27 DIAGNOSIS — E860 Dehydration: Secondary | ICD-10-CM | POA: Diagnosis present

## 2013-05-27 DIAGNOSIS — N390 Urinary tract infection, site not specified: Secondary | ICD-10-CM

## 2013-05-27 DIAGNOSIS — E1142 Type 2 diabetes mellitus with diabetic polyneuropathy: Secondary | ICD-10-CM | POA: Diagnosis present

## 2013-05-27 DIAGNOSIS — K3184 Gastroparesis: Secondary | ICD-10-CM | POA: Diagnosis present

## 2013-05-27 DIAGNOSIS — R739 Hyperglycemia, unspecified: Secondary | ICD-10-CM

## 2013-05-27 DIAGNOSIS — E1149 Type 2 diabetes mellitus with other diabetic neurological complication: Principal | ICD-10-CM | POA: Diagnosis present

## 2013-05-27 DIAGNOSIS — N179 Acute kidney failure, unspecified: Secondary | ICD-10-CM

## 2013-05-27 DIAGNOSIS — J4489 Other specified chronic obstructive pulmonary disease: Secondary | ICD-10-CM | POA: Diagnosis present

## 2013-05-27 DIAGNOSIS — D6489 Other specified anemias: Secondary | ICD-10-CM | POA: Diagnosis present

## 2013-05-27 DIAGNOSIS — R112 Nausea with vomiting, unspecified: Secondary | ICD-10-CM

## 2013-05-27 DIAGNOSIS — Z794 Long term (current) use of insulin: Secondary | ICD-10-CM

## 2013-05-27 LAB — CBC WITH DIFFERENTIAL/PLATELET
Basophils Relative: 0 % (ref 0–1)
Eosinophils Absolute: 0 10*3/uL (ref 0.0–0.7)
HCT: 38.5 % (ref 36.0–46.0)
Hemoglobin: 13.5 g/dL (ref 12.0–15.0)
Lymphocytes Relative: 12 % (ref 12–46)
Lymphs Abs: 2 10*3/uL (ref 0.7–4.0)
MCH: 29.9 pg (ref 26.0–34.0)
MCHC: 35.1 g/dL (ref 30.0–36.0)
MCV: 85.4 fL (ref 78.0–100.0)
Monocytes Absolute: 0.5 10*3/uL (ref 0.1–1.0)
Monocytes Relative: 3 % (ref 3–12)
Neutro Abs: 14.4 10*3/uL — ABNORMAL HIGH (ref 1.7–7.7)
Neutrophils Relative %: 85 % — ABNORMAL HIGH (ref 43–77)
RBC: 4.51 MIL/uL (ref 3.87–5.11)
WBC: 16.9 10*3/uL — ABNORMAL HIGH (ref 4.0–10.5)

## 2013-05-27 LAB — COMPREHENSIVE METABOLIC PANEL
Albumin: 3.9 g/dL (ref 3.5–5.2)
BUN: 14 mg/dL (ref 6–23)
Chloride: 81 mEq/L — ABNORMAL LOW (ref 96–112)
Creatinine, Ser: 0.75 mg/dL (ref 0.50–1.10)
Total Bilirubin: 0.4 mg/dL (ref 0.3–1.2)

## 2013-05-27 LAB — URINALYSIS W MICROSCOPIC + REFLEX CULTURE
Glucose, UA: >1000 mg/dL — AB
Ketones, ur: NEGATIVE mg/dL
Leukocytes, UA: NEGATIVE
Nitrite: NEGATIVE
Protein, ur: >300 mg/dL — AB
pH: 7.5 (ref 5.0–8.0)

## 2013-05-27 LAB — POCT PREGNANCY, URINE: Preg Test, Ur: NEGATIVE

## 2013-05-27 LAB — OCCULT BLOOD GASTRIC / DUODENUM (SPECIMEN CUP)
Occult Blood, Gastric: NEGATIVE
pH, Gastric: 4

## 2013-05-27 LAB — GLUCOSE, CAPILLARY: Glucose-Capillary: 203 mg/dL — ABNORMAL HIGH (ref 70–99)

## 2013-05-27 LAB — LIPASE, BLOOD: Lipase: 20 U/L (ref 11–59)

## 2013-05-27 MED ORDER — POTASSIUM CHLORIDE 20 MEQ/15ML (10%) PO LIQD
30.0000 meq | Freq: Once | ORAL | Status: AC
  Administered 2013-05-27: 30 meq via ORAL
  Filled 2013-05-27: qty 22.5

## 2013-05-27 MED ORDER — BUSPIRONE HCL 5 MG PO TABS
5.0000 mg | ORAL_TABLET | Freq: Three times a day (TID) | ORAL | Status: DC
  Administered 2013-05-27 – 2013-05-29 (×6): 5 mg
  Filled 2013-05-27 (×7): qty 1

## 2013-05-27 MED ORDER — ATORVASTATIN CALCIUM 40 MG PO TABS
40.0000 mg | ORAL_TABLET | Freq: Every day | ORAL | Status: DC
  Administered 2013-05-28: 40 mg
  Filled 2013-05-27 (×2): qty 1

## 2013-05-27 MED ORDER — SUCRALFATE 1 GM/10ML PO SUSP
1.0000 g | Freq: Three times a day (TID) | ORAL | Status: DC
  Administered 2013-05-27 – 2013-05-29 (×6): 1 g
  Filled 2013-05-27 (×10): qty 10

## 2013-05-27 MED ORDER — INSULIN GLARGINE 100 UNIT/ML ~~LOC~~ SOLN
10.0000 [IU] | Freq: Every day | SUBCUTANEOUS | Status: DC
  Administered 2013-05-28: 10 [IU] via SUBCUTANEOUS
  Filled 2013-05-27 (×3): qty 0.1

## 2013-05-27 MED ORDER — ENOXAPARIN SODIUM 40 MG/0.4ML ~~LOC~~ SOLN
40.0000 mg | SUBCUTANEOUS | Status: DC
  Administered 2013-05-27 – 2013-05-28 (×2): 40 mg via SUBCUTANEOUS
  Filled 2013-05-27 (×3): qty 0.4

## 2013-05-27 MED ORDER — HYDROMORPHONE HCL PF 1 MG/ML IJ SOLN
1.0000 mg | INTRAMUSCULAR | Status: AC
  Administered 2013-05-27: 1 mg via INTRAVENOUS
  Filled 2013-05-27: qty 1

## 2013-05-27 MED ORDER — INSULIN ASPART 100 UNIT/ML ~~LOC~~ SOLN: 0.0000 [IU] | SUBCUTANEOUS | Status: DC

## 2013-05-27 MED ORDER — SODIUM CHLORIDE 0.9 % IV BOLUS (SEPSIS)
1000.0000 mL | INTRAVENOUS | Status: AC
  Administered 2013-05-27: 1000 mL via INTRAVENOUS

## 2013-05-27 MED ORDER — METOCLOPRAMIDE HCL 5 MG/5ML PO SOLN
5.0000 mg | Freq: Four times a day (QID) | ORAL | Status: DC | PRN
  Administered 2013-05-28: 5 mg via ORAL
  Filled 2013-05-27: qty 5

## 2013-05-27 MED ORDER — HYDROMORPHONE HCL PF 1 MG/ML IJ SOLN
1.0000 mg | INTRAMUSCULAR | Status: DC | PRN
  Administered 2013-05-27 – 2013-05-28 (×7): 1 mg via INTRAVENOUS
  Filled 2013-05-27 (×7): qty 1

## 2013-05-27 MED ORDER — LISINOPRIL 10 MG PO TABS
10.0000 mg | ORAL_TABLET | Freq: Every day | ORAL | Status: DC
  Administered 2013-05-27 – 2013-05-29 (×3): 10 mg via ORAL
  Filled 2013-05-27 (×3): qty 1

## 2013-05-27 MED ORDER — INSULIN ASPART 100 UNIT/ML ~~LOC~~ SOLN
0.0000 [IU] | SUBCUTANEOUS | Status: DC
  Administered 2013-05-27: 3 [IU] via SUBCUTANEOUS

## 2013-05-27 MED ORDER — PROMETHAZINE HCL 25 MG RE SUPP
25.0000 mg | Freq: Four times a day (QID) | RECTAL | Status: DC | PRN
  Administered 2013-05-28 (×3): 25 mg via RECTAL
  Filled 2013-05-27 (×4): qty 1

## 2013-05-27 MED ORDER — PANTOPRAZOLE SODIUM 40 MG PO PACK
40.0000 mg | PACK | Freq: Every day | ORAL | Status: DC
  Administered 2013-05-27 – 2013-05-29 (×3): 40 mg
  Filled 2013-05-27 (×3): qty 20

## 2013-05-27 MED ORDER — MOMETASONE FURO-FORMOTEROL FUM 100-5 MCG/ACT IN AERO
2.0000 | INHALATION_SPRAY | Freq: Two times a day (BID) | RESPIRATORY_TRACT | Status: DC
  Administered 2013-05-27 – 2013-05-29 (×4): 2 via RESPIRATORY_TRACT
  Filled 2013-05-27: qty 8.8

## 2013-05-27 MED ORDER — INSULIN GLARGINE 100 UNIT/ML ~~LOC~~ SOLN
5.0000 [IU] | Freq: Once | SUBCUTANEOUS | Status: AC
  Administered 2013-05-27: 5 [IU] via SUBCUTANEOUS
  Filled 2013-05-27: qty 0.05

## 2013-05-27 MED ORDER — METOCLOPRAMIDE HCL 5 MG/ML IJ SOLN
10.0000 mg | Freq: Once | INTRAMUSCULAR | Status: AC
  Administered 2013-05-27: 10 mg via INTRAVENOUS
  Filled 2013-05-27: qty 2

## 2013-05-27 MED ORDER — LORAZEPAM 2 MG/ML IJ SOLN
0.5000 mg | Freq: Four times a day (QID) | INTRAMUSCULAR | Status: DC | PRN
  Administered 2013-05-27 – 2013-05-29 (×4): 0.5 mg via INTRAVENOUS
  Filled 2013-05-27 (×4): qty 1

## 2013-05-27 MED ORDER — SODIUM CHLORIDE 0.9 % IV SOLN
INTRAVENOUS | Status: DC
  Administered 2013-05-27 – 2013-05-29 (×4): via INTRAVENOUS

## 2013-05-27 MED ORDER — AMLODIPINE BESYLATE 10 MG PO TABS
10.0000 mg | ORAL_TABLET | Freq: Every day | ORAL | Status: DC
  Administered 2013-05-28 – 2013-05-29 (×2): 10 mg
  Filled 2013-05-27 (×2): qty 1

## 2013-05-27 MED ORDER — IOHEXOL 300 MG/ML  SOLN
100.0000 mL | Freq: Once | INTRAMUSCULAR | Status: AC | PRN
  Administered 2013-05-27: 100 mL via INTRAVENOUS

## 2013-05-27 MED ORDER — SODIUM CHLORIDE 0.9 % IV SOLN: INTRAVENOUS | Status: DC

## 2013-05-27 MED ORDER — GABAPENTIN 600 MG PO TABS
600.0000 mg | ORAL_TABLET | Freq: Three times a day (TID) | ORAL | Status: DC
  Administered 2013-05-27: 600 mg via ORAL
  Filled 2013-05-27 (×4): qty 1

## 2013-05-27 MED ORDER — POTASSIUM CHLORIDE CRYS ER 20 MEQ PO TBCR
30.0000 meq | EXTENDED_RELEASE_TABLET | Freq: Once | ORAL | Status: DC
  Filled 2013-05-27: qty 1

## 2013-05-27 MED ORDER — INSULIN ASPART 100 UNIT/ML ~~LOC~~ SOLN
5.0000 [IU] | Freq: Once | SUBCUTANEOUS | Status: AC
  Administered 2013-05-27: 5 [IU] via SUBCUTANEOUS

## 2013-05-27 MED ORDER — SODIUM CHLORIDE 0.9 % IJ SOLN
3.0000 mL | Freq: Two times a day (BID) | INTRAMUSCULAR | Status: DC
  Administered 2013-05-29: 10:00:00 3 mL via INTRAVENOUS

## 2013-05-27 MED ORDER — IOHEXOL 300 MG/ML  SOLN
50.0000 mL | Freq: Once | INTRAMUSCULAR | Status: AC | PRN
  Administered 2013-05-27: 50 mL via ORAL

## 2013-05-27 NOTE — ED Notes (Signed)
Pt states started having abdominal pain, nausea and vomiting last night, pt vomiting in restroom once arrived to ED

## 2013-05-27 NOTE — ED Provider Notes (Addendum)
CSN: [id number]     Arrival date & time 05/27/13  [id number] History   First MD Initiated Contact with Patient 05/27/13 [phone]     Chief Complaint  Patient presents with  . Nausea  . Emesis   (Consider location/radiation/quality/duration/timing/severity/associated sxs/prior Treatment) Patient is a 42 y.o. female presenting with abdominal pain. The history is provided by the patient.  Abdominal Pain Pain location:  Epigastric Pain quality: aching and sharp   Pain radiates to:  Does not radiate Pain severity:  Moderate Onset quality:  Gradual Duration:  12 hours Timing:  Constant Progression:  Worsening Chronicity:  Recurrent Context comment:  Hx of gastroparesis Relieved by:  Nothing Worsened by:  Nothing tried Ineffective treatments:  None tried Associated symptoms: nausea and vomiting   Associated symptoms: no chest pain, no cough, no diarrhea, no dysuria, no fatigue, no fever, no hematuria and no shortness of breath   Associated symptoms comment:  Hematemesis    Past Medical History  Diagnosis Date  . Hypertension   . Diabetes mellitus   . Gastroparesis   . Asthma   . GERD (gastroesophageal reflux disease)   . Coronary artery disease   . h/o seizure   . Neuropathy   . Chronic pancreatitis   . Dyslipidemia   . MI (myocardial infarction)   . COPD (chronic obstructive pulmonary disease)   . Seizures    Past Surgical History  Procedure Laterality Date  . Abdominal hysterectomy    . Cholecystectomy    . Peg tube x 4      feeding jejunostomies with PEG tubes  . Cesarean section    . Colon resection due to diverticulitis    . Back surgery    . Esophagogastroduodenoscopy N/A 12/27/2012    Procedure: ESOPHAGOGASTRODUODENOSCOPY (EGD);  Surgeon: Missy Sabins, MD;  Location: [facility];  Service: Endoscopy;  Laterality: N/A;  . Esophagogastroduodenoscopy (egd) with esophageal dilation N/A 02/19/2013    Procedure: ESOPHAGOGASTRODUODENOSCOPY (EGD) WITH ESOPHAGEAL DILATION;   Surgeon: Wonda Horner, MD;  Location: [facility];  Service: Endoscopy;  Laterality: N/A;  . Balloon dilation N/A 02/19/2013    Procedure: BALLOON DILATION;  Surgeon: Wonda Horner, MD;  Location: [facility];  Service: Endoscopy;  Laterality: N/A;   Family History  Problem Relation Age of Onset  . Cancer Mother    History  Substance Use Topics  . Smoking status: Former Smoker -- 0.25 packs/day for 2 years    Types: Cigarettes  . Smokeless tobacco: Never Used  . Alcohol Use: No     Comment: hx 1 beer daily -pt states she quit Dec. 2012   OB History   Grav Para Term Preterm Abortions TAB SAB Ect Mult Living                 Review of Systems  Constitutional: Negative for fever and fatigue.  HENT: Negative for congestion and drooling.   Eyes: Negative for pain.  Respiratory: Negative for cough and shortness of breath.   Cardiovascular: Negative for chest pain.  Gastrointestinal: Positive for nausea, vomiting and abdominal pain. Negative for diarrhea.  Genitourinary: Negative for dysuria and hematuria.  Musculoskeletal: Negative for back pain, gait problem and neck pain.  Skin: Negative for color change.  Neurological: Negative for dizziness and headaches.  Hematological: Negative for adenopathy.  Psychiatric/Behavioral: Negative for behavioral problems.  All other systems reviewed and are negative.    Allergies  Cefadroxil; Darvocet; Compazine; Nsaids; Penicillins; and Zofran  Home Medications   Current Outpatient  Rx  Name  Route  Sig  Dispense  Refill  . amLODipine (NORVASC) 10 MG tablet   Per Tube   Place 1 tablet (10 mg total) into feeding tube daily.   30 tablet   2   . atorvastatin (LIPITOR) 40 MG tablet   Per Tube   Place 1 tablet (40 mg total) into feeding tube daily at 6 PM.   30 tablet   0   . busPIRone (BUSPAR) 5 MG tablet   Per Tube   Place 5 mg into feeding tube 3 (three) times daily.         . Fluticasone-Salmeterol (ADVAIR) 250-50 MCG/DOSE  AEPB   Inhalation   Inhale 1 puff into the lungs 2 (two) times daily.   60 each   0   . gabapentin (NEURONTIN) 300 MG capsule   Per Tube   Place 2 capsules (600 mg total) into feeding tube 3 (three) times daily.   30 capsule   0   . insulin aspart (NOVOLOG) 100 UNIT/ML injection   Subcutaneous   Inject 0-10 Units into the skin every 4 (four) hours.         . insulin glargine (LANTUS) 100 UNIT/ML injection   Subcutaneous   Inject 0.15 mLs (15 Units total) into the skin daily.   10 mL   5   . lisinopril (PRINIVIL,ZESTRIL) 10 MG tablet      10 mg. 1 tablet (10 mg total) by Per J Tube route daily.         . metoCLOPramide (REGLAN) 5 MG/5ML solution   Oral   Take 5 mLs (5 mg total) by mouth every 6 (six) hours as needed.   120 mL   0   . Multiple Vitamin (MULTIVITAMIN) LIQD   Per Tube   Place 5 mLs into feeding tube daily.   60 mL   1   . Nutritional Supplements (FEEDING SUPPLEMENT, VITAL AF 1.2 CAL,) LIQD   Per Tube   Place 119-237 mLs into feeding tube every 4 (four) hours.   1500 mL   10   . oxyCODONE (ROXICODONE) 5 MG/5ML solution   Per Tube   Place 2.5 mLs (2.5 mg total) into feeding tube every 6 (six) hours as needed for pain.   50 mL   0   . pantoprazole sodium (PROTONIX) 40 mg/20 mL PACK   Per Tube   Place 20 mLs (40 mg total) into feeding tube daily.   30 each   1   . promethazine (PHENERGAN) 12.5 MG suppository   Rectal   Place 1 suppository (12.5 mg total) rectally every 6 (six) hours as needed for nausea.   30 each   0   . sucralfate (CARAFATE) 1 GM/10ML suspension   Per Tube   Place 10 mLs (1 g total) into feeding tube 4 (four) times daily -  with meals and at bedtime.   420 mL   0   . Water For Irrigation, Sterile (FREE WATER) SOLN   Per Tube   Place 60 mLs into feeding tube every 4 (four) hours.         Marland Kitchen zolpidem (AMBIEN) 10 MG tablet   Tube   Give 10 mg by tube at bedtime as needed for sleep.          BP 187/113  Pulse  130  Temp(Src) 98.5 F (36.9 C) (Oral)  Resp 23  SpO2 100% Physical Exam  Nursing note and vitals reviewed. Constitutional: She  is oriented to person, place, and time. She appears well-developed and well-nourished.  HENT:  Head: Normocephalic.  Mouth/Throat: Oropharynx is clear and moist. No oropharyngeal exudate.  Eyes: Conjunctivae and EOM are normal. Pupils are equal, round, and reactive to light.  Neck: Normal range of motion. Neck supple.  Cardiovascular: Normal rate, regular rhythm, normal heart sounds and intact distal pulses.  Exam reveals no gallop and no friction rub.   No murmur heard. Pulmonary/Chest: Effort normal and breath sounds normal. No respiratory distress. She has no wheezes.  Abdominal: Soft. Bowel sounds are normal. There is tenderness (moderate epig ttp). There is no rebound and no guarding.  Musculoskeletal: Normal range of motion. She exhibits no edema and no tenderness.  Neurological: She is alert and oriented to person, place, and time.  Skin: Skin is warm and dry.  Psychiatric: She has a normal mood and affect. Her behavior is normal.    ED Course  Procedures (including critical care time) Labs Review Labs Reviewed  CBC WITH DIFFERENTIAL - Abnormal; Notable for the following:    WBC 16.9 (*)    Neutrophils Relative % 85 (*)    Neutro Abs 14.4 (*)    All other components within normal limits  COMPREHENSIVE METABOLIC PANEL - Abnormal; Notable for the following:    Sodium 126 (*)    Chloride 81 (*)    Glucose, Bld 359 (*)    Total Protein 9.1 (*)    Alkaline Phosphatase 138 (*)    All other components within normal limits  URINALYSIS W MICROSCOPIC + REFLEX CULTURE - Abnormal; Notable for the following:    Glucose, UA >1000 (*)    Protein, ur >300 (*)    Casts HYALINE CASTS (*)    All other components within normal limits  LACTATE DEHYDROGENASE - Abnormal; Notable for the following:    LDH 322 (*)    All other components within normal limits   GLUCOSE, CAPILLARY - Abnormal; Notable for the following:    Glucose-Capillary 203 (*)    All other components within normal limits  LIPASE, BLOOD  TROPONIN I  OCCULT BLOOD GASTRIC / DUODENUM (SPECIMEN CUP)  COMPREHENSIVE METABOLIC PANEL  CBC  BASIC METABOLIC PANEL  POCT PREGNANCY, URINE  OCCULT BLOOD, POC DEVICE   Imaging Review Ct Abdomen Pelvis W Contrast  05/27/2013   CLINICAL DATA:  Epigastric pain, vomiting  EXAM: CT ABDOMEN AND PELVIS WITH CONTRAST  TECHNIQUE: Multidetector CT imaging of the abdomen and pelvis was performed using the standard protocol following bolus administration of intravenous contrast.  CONTRAST:  97mL OMNIPAQUE IOHEXOL 300 MG/ML SOLN, 162mL OMNIPAQUE IOHEXOL 300 MG/ML SOLN  COMPARISON:  February 06, 2013  FINDINGS: The patient is status post prior cholecystectomy. There is postsurgical intrahepatic biliary ductal dilatation. There is dilatation of the common bowel duct measuring 11.9 mm postsurgical. The liver is otherwise normal. The spleen, pancreas, adrenal glands and kidneys are normal. There is no hydronephrosis bilaterally. There is atherosclerosis of the abdominal aorta without aneurysmal dilatation. The PEG tube is identified in the stomach. Nasogastric tube is identified. There is a small hiatal hernia. There is no small bowel obstruction or diverticulitis. There is evidence of prior small bowel surgery in the left mid abdomen.  Fluid-filled bladder is normal. The patient is status post prior hysterectomy. Pelvic phleboliths are noted. The lung bases are clear. The patient is status post prior fixation of lower lumbar spine. No acute abnormalities identified within the visualized bones.  IMPRESSION: No acute abnormality identified  in the abdomen and pelvis. There is no evidence of pancreatitis. The patient is status post prior cholecystectomy and hysterectomy.   Electronically Signed   By: Abelardo Diesel M.D.   On: 05/27/2013 16:01   Portable Chest 1  View  05/27/2013   CLINICAL DATA:  Chest pain  EXAM: PORTABLE CHEST - 1 VIEW  COMPARISON:  05/11/2013  FINDINGS: The heart size and mediastinal contours are within normal limits. Both lungs are clear. The visualized skeletal structures are unremarkable.  IMPRESSION: No active disease.   Electronically Signed   By: Daryll Brod M.D.   On: 05/27/2013 17:05    EKG Interpretation     Ventricular Rate:  113 PR Interval:  134 QRS Duration: 72 QT Interval:  341 QTC Calculation: 467 R Axis:   5 Text Interpretation:  Sinus tachycardia Consider right atrial enlargement No significant change since last tracing           Procedure note: Ultrasound Guided Peripheral IV Ultrasound guided 20 g peripheral 1.88 inch angiocath IV placement performed by me. Indications: Nursing unable to place IV. Details: The left antecubital fossa and upper arm was evaluated with a multifrequency linear probe. Several patent brachial veins are noted. 2 attempts were made to cannulate a left basilic vein under realtime US guidance with successful cannulation of the vein and catheter placement. There is return of non-pulsatile dark red blood. The patient tolerated the procedure well without complications.    MDM   1. Epigastric pain   2. Leukocytosis   3. Dehydration   4. Hyperglycemia   5. Hypochloremia    10:13 AM 42 y.o. female with a history of gastroparesis and self reported history of pancreatitis who presents with epigastric pain since yesterday evening. She also notes multiple episodes of vomiting. She states that she has seen blood in her emesis today but cannot quantify it. She is afebrile and tachycardic here. She states that her symptoms are consistent with gastroparesis which she had previously. Will get labs, IV fluid, pain control.  Pt not able to tolerate po, will admit to hospitalist.     Blanchard Kelch, MD 05/27/13 [place], MD 05/28/13 [phone]

## 2013-05-27 NOTE — ED Notes (Signed)
Bed: WA20 Expected date:  Expected time:  Means of arrival:  Comments: NV

## 2013-05-27 NOTE — ED Notes (Signed)
Pt will not stay hooked up to monitor, keeps walking out of room saying "please help me, please help me". Pt instructed she needs to stay in room hooked up to monitor.

## 2013-05-27 NOTE — ED Notes (Signed)
Per EMS pt has hx of gastroparesis, having nausea/vomiting, pt states it has blood in it, vomited on arrival to hospital, dark in color, BP 185/116, HR 125, 100% RA.

## 2013-05-27 NOTE — H&P (Signed)
[facility] History and Physical  ITA HOLTAN [id number] DOB: August 30, 1970 DOA: 05/27/2013  Referring physician: Dr. Aline Brochure PCP: [name], MD  Specialists: none  Chief Complaint: nausea/vomiting/abdominal pain  HPI: [name] is a 42 y.o. female has a past medical history significant for poorly controlled diabetes (last HBA1C 10.2 on Oct 1st), diabetic gastroparesis status post PEG tube, hypertension with multiple prior hospitalizations for treatment of gastroparesis flare. She presents today with a chief complaint of severe abdominal pain with nausea and vomiting for the past 12-24 hours. She states that this is typical of her flare-ups. In the ED patient appears very uncomfortable. She states that she is strict NPO at home and uses her PEG tube at all times. She has a feeding pump. She endorses fever and chills at home (states that she measured 104 last night), endorses chest pain with cough, sputum production. She endorses dysuria. She has a headache, lightheadedness. She has back pain also. In the ED workup significant for hyponatremia. She also complains of watery diarrhea which is new for her.   Review of Systems: as per HPI otherwise negative.   Past Medical History  Diagnosis Date  . Hypertension   . Diabetes mellitus   . Gastroparesis   . Asthma   . GERD (gastroesophageal reflux disease)   . Coronary artery disease   . h/o seizure   . Neuropathy   . Chronic pancreatitis   . Dyslipidemia   . MI (myocardial infarction)   . COPD (chronic obstructive pulmonary disease)   . Seizures    Past Surgical History  Procedure Laterality Date  . Abdominal hysterectomy    . Cholecystectomy    . Peg tube x 4      feeding jejunostomies with PEG tubes  . Cesarean section    . Colon resection due to diverticulitis    . Back surgery    . Esophagogastroduodenoscopy N/A 12/27/2012    Procedure: ESOPHAGOGASTRODUODENOSCOPY (EGD);  Surgeon: Missy Sabins, MD;  Location: [facility];  Service: Endoscopy;  Laterality: N/A;  . Esophagogastroduodenoscopy (egd) with esophageal dilation N/A 02/19/2013    Procedure: ESOPHAGOGASTRODUODENOSCOPY (EGD) WITH ESOPHAGEAL DILATION;  Surgeon: Wonda Horner, MD;  Location: [facility];  Service: Endoscopy;  Laterality: N/A;  . Balloon dilation N/A 02/19/2013    Procedure: BALLOON DILATION;  Surgeon: Wonda Horner, MD;  Location: [facility];  Service: Endoscopy;  Laterality: N/A;   Social History:  reports that she has quit smoking. Her smoking use included Cigarettes. She has a .5 pack-year smoking history. She has never used smokeless tobacco. She reports that she does not drink alcohol or use illicit drugs.  Allergies  Allergen Reactions  . Cefadroxil Other (See Comments)    Not sure of effects-listed as allergy on list from home  . Darvocet [Propoxyphene-Acetaminophen] Other (See Comments)    UNKNOWN  . Compazine Hives    Tolerates promethazine  . Nsaids Hives  . Penicillins Hives  . Zofran [Ondansetron] Hives    Family History  Problem Relation Age of Onset  . Cancer Mother      Prior to Admission medications   Medication Sig Start Date End Date Taking? Authorizing Provider  amLODipine (NORVASC) 10 MG tablet Place 1 tablet (10 mg total) into feeding tube daily. 02/20/13  Yes Robbie Lis, MD  atorvastatin (LIPITOR) 40 MG tablet Place 1 tablet (40 mg total) into feeding tube daily at 6 PM. 02/20/13  Yes Robbie Lis, MD  busPIRone (BUSPAR) 5 MG  tablet Place 5 mg into feeding tube 3 (three) times daily.   Yes Historical Provider, MD  Fluticasone-Salmeterol (ADVAIR) 250-50 MCG/DOSE AEPB Inhale 1 puff into the lungs 2 (two) times daily. 12/02/11  Yes Robbie Lis, MD  gabapentin (NEURONTIN) 600 MG tablet Take 600 mg by mouth 3 (three) times daily.   Yes Historical Provider, MD  HYDROmorphone (DILAUDID) 4 MG tablet Place 4 mg into feeding tube every 6 (six) hours as needed for pain.   Yes Historical Provider, MD   insulin aspart (NOVOLOG) 100 UNIT/ML injection Inject 0-10 Units into the skin every 4 (four) hours.   Yes Historical Provider, MD  insulin glargine (LANTUS) 100 UNIT/ML injection Inject 0.15 mLs (15 Units total) into the skin daily. 05/07/13  Yes Hosie Poisson, MD  lisinopril (PRINIVIL,ZESTRIL) 10 MG tablet 10 mg. 1 tablet (10 mg total) by Per J Tube route daily. 03/29/13  Yes Historical Provider, MD  LORazepam (ATIVAN) 1 MG tablet Take 1 mg by mouth every 6 (six) hours as needed for anxiety.   Yes Historical Provider, MD  metoCLOPramide (REGLAN) 5 MG/5ML solution Take 5 mLs (5 mg total) by mouth every 6 (six) hours as needed. 05/13/13  Yes Hoy Morn, MD  Multiple Vitamin (MULTIVITAMIN) LIQD Place 5 mLs into feeding tube daily. 05/07/13  Yes Hosie Poisson, MD  Nutritional Supplements (FEEDING SUPPLEMENT, VITAL AF 1.2 CAL,) LIQD Place 119-237 mLs into feeding tube every 4 (four) hours. 05/08/13  Yes Hosie Poisson, MD  oxyCODONE (ROXICODONE) 5 MG/5ML solution Place 2.5 mLs (2.5 mg total) into feeding tube every 6 (six) hours as needed for pain. 05/07/13  Yes Hosie Poisson, MD  pantoprazole (PROTONIX) 40 MG tablet Place 40 mg into feeding tube 2 (two) times daily.    Yes Historical Provider, MD  pantoprazole sodium (PROTONIX) 40 mg/20 mL PACK Place 40 mg into feeding tube daily.   Yes Historical Provider, MD  promethazine (PHENERGAN) 25 MG suppository Place 25 mg rectally every 6 (six) hours as needed for nausea.   Yes Historical Provider, MD  sucralfate (CARAFATE) 1 GM/10ML suspension Place 10 mLs (1 g total) into feeding tube 4 (four) times daily -  with meals and at bedtime. 05/13/13  Yes Hoy Morn, MD  Water For Irrigation, Sterile (FREE WATER) SOLN Place 60 mLs into feeding tube every 4 (four) hours. 05/08/13  Yes Hosie Poisson, MD  zolpidem (AMBIEN) 10 MG tablet Place 10 mg into feeding tube at bedtime.    Yes Historical Provider, MD   Physical Exam: Filed Vitals:   05/27/13 0929 05/27/13 1128  05/27/13 1409  BP: 187/113 186/111 172/100  Pulse: 130 110 105  Temp: 98.5 F (36.9 C)    TempSrc: Oral    Resp: 23 23 18   SpO2: 100% 99% 100%     General:  No apparent distress  Eyes: PERRL, EOMI, no scleral icterus  ENT: moist oropharynx  Neck: supple, no JVD  Cardiovascular: regular rate without MRG; 2+ peripheral pulses  Respiratory: CTA biL, good air movement without wheezing, rhonchi or crackled  Abdomen: soft, non tender to palpation, positive bowel sounds, no guarding, no rebound  Skin: no rashes  Musculoskeletal: no peripheral edema  Psychiatric: normal mood and affect  Neurologic: CN 2-12 grossly intact, MS 5/5 in all 4  Labs on Admission:  Basic Metabolic Panel:  Recent Labs Lab 05/27/13 1120  NA 126*  K 3.7  CL 81*  CO2 25  GLUCOSE 359*  BUN 14  CREATININE 0.75  CALCIUM 10.5   Liver Function Tests:  Recent Labs Lab 05/27/13 1120  AST 18  ALT 26  ALKPHOS 138*  BILITOT 0.4  PROT 9.1*  ALBUMIN 3.9    Recent Labs Lab 05/27/13 1120  LIPASE 20   No results found for this basename: AMMONIA,  in the last 168 hours CBC:  Recent Labs Lab 05/27/13 1120  WBC 16.9*  NEUTROABS 14.4*  HGB 13.5  HCT 38.5  MCV 85.4  PLT 395   Cardiac Enzymes:  Recent Labs Lab 05/27/13 1120  TROPONINI <0.30    BNP (last 3 results) No results found for this basename: PROBNP,  in the last 8760 hours CBG: No results found for this basename: GLUCAP,  in the last 168 hours  Radiological Exams on Admission: Ct Abdomen Pelvis W Contrast  05/27/2013   CLINICAL DATA:  Epigastric pain, vomiting  EXAM: CT ABDOMEN AND PELVIS WITH CONTRAST  TECHNIQUE: Multidetector CT imaging of the abdomen and pelvis was performed using the standard protocol following bolus administration of intravenous contrast.  CONTRAST:  67mL OMNIPAQUE IOHEXOL 300 MG/ML SOLN, 177mL OMNIPAQUE IOHEXOL 300 MG/ML SOLN  COMPARISON:  February 06, 2013  FINDINGS: The patient is status post prior  cholecystectomy. There is postsurgical intrahepatic biliary ductal dilatation. There is dilatation of the common bowel duct measuring 11.9 mm postsurgical. The liver is otherwise normal. The spleen, pancreas, adrenal glands and kidneys are normal. There is no hydronephrosis bilaterally. There is atherosclerosis of the abdominal aorta without aneurysmal dilatation. The PEG tube is identified in the stomach. Nasogastric tube is identified. There is a small hiatal hernia. There is no small bowel obstruction or diverticulitis. There is evidence of prior small bowel surgery in the left mid abdomen.  Fluid-filled bladder is normal. The patient is status post prior hysterectomy. Pelvic phleboliths are noted. The lung bases are clear. The patient is status post prior fixation of lower lumbar spine. No acute abnormalities identified within the visualized bones.  IMPRESSION: No acute abnormality identified in the abdomen and pelvis. There is no evidence of pancreatitis. The patient is status post prior cholecystectomy and hysterectomy.   Electronically Signed   By: Abelardo Diesel M.D.   On: 05/27/2013 16:01   EKG: Independently reviewed.  Assessment/Plan Principal Problem:   Intractable nausea and vomiting Active Problems:   Gastroparesis   Dyslipidemia   GERD (gastroesophageal reflux disease)   Hypertension   DM (diabetes mellitus), type 2, uncontrolled   Dehydration   Hyponatremia   Leukocytosis, unspecified   Abdominal pain   Diabetic neuropathy   Protein-calorie malnutrition, severe   Epigastric pain   Leukocytosis   Hyperglycemia   Hypochloremia    Intractable nausea/vomiting - likely due to gastroparesis. Antiemetics, IVF, NPO IDDM with Hyperglycemia  - gap present, but bicarb 25. Last admission needed SDU transfer for insulin gtt, OK for floor for now. 5U Novolog now, plus SSI Q4. Monitor. - consult to diabetic educator.  Hyponatremia - component of pseudo and dehydration - IVF and  monitor. Leukocytosis - likely reactive; patient reports fever. UA without evidence of UTI - obtain CXR - history of UTIs most recent on 05/02/13 with Enterococcus sst to Ampicillin Gastroparesis - NPO, reglan, antiemetics Abdominal pain - due to severe vomiting and gastroparesis. No evidence of pancreatitis.  Protein-calorie malnutrition, severe - nutrition consult GERD - continue PPI Diabetic neuropathy - continue home medications Anxiety - ativan HTN - continue Lisinopril and Norvasc History of hypokalemia - replete K to 4. Repeat BMP tonight.  Diarrhea - check C diff, at risk given multiple hospitalizations.   Diet: NPO Fluids: NS DVT Prophylaxis: Lovenox  Code Status: Full  Family Communication: none  Disposition Plan: inpatient  Time spent: 22  Adison Jerger M. Cruzita Lederer, MD [facility] Pager [phone]  If 7PM-7AM, please contact night-coverage [email or web address] Password [facility] 05/27/2013, 4:54 PM

## 2013-05-27 NOTE — ED Notes (Addendum)
Can not perform EKG patient is standing up and walking around. States she's in pain. Will perform after patient get IV meds.

## 2013-05-27 NOTE — ED Notes (Signed)
IV team paged to start IV d/t pt having hx of hard IV sticks.

## 2013-05-28 DIAGNOSIS — K3184 Gastroparesis: Secondary | ICD-10-CM

## 2013-05-28 DIAGNOSIS — K219 Gastro-esophageal reflux disease without esophagitis: Secondary | ICD-10-CM

## 2013-05-28 LAB — BASIC METABOLIC PANEL
BUN: 12 mg/dL (ref 6–23)
CO2: 25 mEq/L (ref 19–32)
Calcium: 8.2 mg/dL — ABNORMAL LOW (ref 8.4–10.5)
Chloride: 95 mEq/L — ABNORMAL LOW (ref 96–112)
Creatinine, Ser: 0.65 mg/dL (ref 0.50–1.10)
Glucose, Bld: 76 mg/dL (ref 70–99)
Potassium: 3.6 mEq/L (ref 3.5–5.1)

## 2013-05-28 LAB — COMPREHENSIVE METABOLIC PANEL
AST: 14 U/L (ref 0–37)
Albumin: 2.4 g/dL — ABNORMAL LOW (ref 3.5–5.2)
Alkaline Phosphatase: 76 U/L (ref 39–117)
Chloride: 97 mEq/L (ref 96–112)
Creatinine, Ser: 0.78 mg/dL (ref 0.50–1.10)
Glucose, Bld: 95 mg/dL (ref 70–99)
Potassium: 4.2 mEq/L (ref 3.5–5.1)
Sodium: 131 mEq/L — ABNORMAL LOW (ref 135–145)
Total Bilirubin: 0.2 mg/dL — ABNORMAL LOW (ref 0.3–1.2)

## 2013-05-28 LAB — CBC
Hemoglobin: 9 g/dL — ABNORMAL LOW (ref 12.0–15.0)
MCH: 29.9 pg (ref 26.0–34.0)
MCV: 86 fL (ref 78.0–100.0)
Platelets: 257 10*3/uL (ref 150–400)
RBC: 3.14 MIL/uL — ABNORMAL LOW (ref 3.87–5.11)
RBC: 2.97 MIL/uL — ABNORMAL LOW (ref 3.87–5.11)
WBC: 6.6 10*3/uL (ref 4.0–10.5)

## 2013-05-28 LAB — GLUCOSE, CAPILLARY
Glucose-Capillary: 225 mg/dL — ABNORMAL HIGH (ref 70–99)
Glucose-Capillary: 77 mg/dL (ref 70–99)
Glucose-Capillary: 89 mg/dL (ref 70–99)
Glucose-Capillary: 90 mg/dL (ref 70–99)
Glucose-Capillary: 94 mg/dL (ref 70–99)
Glucose-Capillary: 96 mg/dL (ref 70–99)

## 2013-05-28 MED ORDER — TRAMADOL HCL 50 MG PO TABS
50.0000 mg | ORAL_TABLET | Freq: Four times a day (QID) | ORAL | Status: DC | PRN
  Administered 2013-05-29: 100 mg via ORAL
  Filled 2013-05-28: qty 2

## 2013-05-28 MED ORDER — ACETAMINOPHEN 500 MG PO TABS: 1000.0000 mg | ORAL_TABLET | Freq: Three times a day (TID) | ORAL | Status: DC

## 2013-05-28 MED ORDER — INSULIN ASPART 100 UNIT/ML ~~LOC~~ SOLN
0.0000 [IU] | SUBCUTANEOUS | Status: DC
  Administered 2013-05-28: 20:00:00 3 [IU] via SUBCUTANEOUS
  Administered 2013-05-29: 13:00:00 via SUBCUTANEOUS
  Administered 2013-05-29 (×2): 1 [IU] via SUBCUTANEOUS

## 2013-05-28 MED ORDER — INSULIN ASPART 100 UNIT/ML ~~LOC~~ SOLN: 0.0000 [IU] | Freq: Four times a day (QID) | SUBCUTANEOUS | Status: DC

## 2013-05-28 MED ORDER — GABAPENTIN 300 MG PO CAPS
600.0000 mg | ORAL_CAPSULE | Freq: Three times a day (TID) | ORAL | Status: DC
  Administered 2013-05-28 – 2013-05-29 (×4): 600 mg via ORAL
  Filled 2013-05-28 (×6): qty 2

## 2013-05-28 MED ORDER — METOCLOPRAMIDE HCL 5 MG/5ML PO SOLN
10.0000 mg | Freq: Three times a day (TID) | ORAL | Status: DC
  Administered 2013-05-28 – 2013-05-29 (×3): 10 mg via ORAL
  Filled 2013-05-28 (×5): qty 10

## 2013-05-28 MED ORDER — VITAL AF 1.2 CAL PO LIQD
237.0000 mL | ORAL | Status: DC
  Administered 2013-05-28 – 2013-05-29 (×4): 237 mL
  Filled 2013-05-28 (×8): qty 237

## 2013-05-28 MED ORDER — ACETAMINOPHEN 160 MG/5ML PO SOLN
650.0000 mg | Freq: Three times a day (TID) | ORAL | Status: DC
  Administered 2013-05-28 – 2013-05-29 (×3): 650 mg
  Filled 2013-05-28 (×3): qty 20.3

## 2013-05-28 MED ORDER — HYDROMORPHONE HCL PF 1 MG/ML IJ SOLN
0.5000 mg | INTRAMUSCULAR | Status: DC | PRN
  Administered 2013-05-28 – 2013-05-29 (×5): 1 mg via INTRAVENOUS
  Filled 2013-05-28 (×5): qty 1

## 2013-05-28 NOTE — Progress Notes (Signed)
[facility] PROGRESS NOTE  [name] [id number] DOB: February 15, 1971 DOA: 05/27/2013 PCP: [name], MD  Assessment/Plan  Intractable nausea/vomiting.  BUN:Cr wnl.  Per nursing staff, resolving.  -  Restart tube feeds  IDDM with Hyperglycemia  - FS low normal   - Continue q4h SSI until she is tolerating tube feeds again - consult to diabetic educator.   Hyponatremia, resolving and was partly due to dehydration - Continue IVF   Leukocytosis due to dehydration and resolved with IVF.  UA and CXR neg.  CT abd/pelvis unremarkable.  Gastroparesis  -  Increase and schedule reglan.  -  Restart tube feeds -  Continue antiemetics -  If not tolerating tube feeds, consider IV erythromycin  -  Followed by Dr. Macario Golds et al.  Can consult if not improving.    Abdominal pain likely due to vomiting and gastroparesis. No evidence of pancreatitis.  -  Minimize narcotics as these worsen gastroparesis  Protein-calorie malnutrition, resolving.  Patient gaining weight since Sept.  Appreciate nutrition assistance.  GERD - continue PPI  Diabetic neuropathy - continue home medications  Anxiety - ativan  HTN - continue Lisinopril and Norvasc  History of hypokalemia - resolved. Diarrhea - C. Diff not drawn yet Normocytic anemia, hemoglobin trending down.  Denies melena and rectal bleeding.  - Repeat CBC in AM.   - Occult stool   Diet:  NPO and restart tube feeds Access:  PIV and may need PICC  IVF:  yes Proph:  lovenox  Code Status: full Family Communication: patient alone Disposition Plan: pending tolerating tube feeds   Consultants:  None  Procedures:  CT abd/pelvis  Antibiotics:  None   HPI/Subjective:  Patient was resting comfortably before I entered the room.  After I introduced myself, she started moaning and complaining of abdominal pain.  She states her vomiting is improved.  No BMs since admission.    Objective: Filed Vitals:   05/28/13 0413  05/28/13 0755 05/28/13 0945 05/28/13 1556  BP: 110/67  124/70 118/68  Pulse: 96  96 88  Temp: 98.8 F (37.1 C)   98.7 F (37.1 C)  TempSrc: Oral   Oral  Resp: 20  16 18   Height:      Weight:      SpO2: 100% 98% 100% 100%    Intake/Output Summary (Last 24 hours) at 05/28/13 1628 Last data filed at 05/28/13 1446  Gross per 24 hour  Intake 2256.67 ml  Output      0 ml  Net 2256.67 ml   Filed Weights   05/27/13 1730  Weight: 64.3 kg (141 lb 12.1 oz)    Exam:   General:  AAF, No acute distress  HEENT:  NCAT, MMM  Cardiovascular:  RRR, nl S1, S2 no mrg, 2+ pulses, warm extremities  Respiratory:  CTAB, no increased WOB  Abdomen:   Hypoactive BS, soft, voluntary guarding throughout and refuses to relax for me to examine.    MSK:   Normal tone and bulk, no LEE  Neuro:  Grossly intact  Data Reviewed: Basic Metabolic Panel:  Recent Labs Lab 05/27/13 1120 05/27/13 2235 05/28/13 0510  NA 126* 131* 131*  K 3.7 3.6 4.2  CL 81* 95* 97  CO2 25 25 23   GLUCOSE 359* 76 95  BUN 14 12 14   CREATININE 0.75 0.65 0.78  CALCIUM 10.5 8.2* 8.2*   Liver Function Tests:  Recent Labs Lab 05/27/13 1120 05/28/13 0510  AST 18 14  ALT 26 14  ALKPHOS  138* 76  BILITOT 0.4 0.2*  PROT 9.1* 5.8*  ALBUMIN 3.9 2.4*    Recent Labs Lab 05/27/13 1120  LIPASE 20   No results found for this basename: AMMONIA,  in the last 168 hours CBC:  Recent Labs Lab 05/27/13 1120 05/28/13 0510 05/28/13 1332  WBC 16.9* 8.1 6.6  NEUTROABS 14.4*  --   --   HGB 13.5 9.4* 9.0*  HCT 38.5 27.0* 26.0*  MCV 85.4 86.0 87.5  PLT 395 257 222   Cardiac Enzymes:  Recent Labs Lab 05/27/13 1120  TROPONINI <0.30   BNP (last 3 results) No results found for this basename: PROBNP,  in the last 8760 hours CBG:  Recent Labs Lab 05/27/13 2026 05/28/13 0011 05/28/13 0405 05/28/13 0722 05/28/13 1218  GLUCAP 78 77 89 94 96    No results found for this or any previous visit (from the past  240 hour(s)).   Studies: Ct Abdomen Pelvis W Contrast  05/27/2013   CLINICAL DATA:  Epigastric pain, vomiting  EXAM: CT ABDOMEN AND PELVIS WITH CONTRAST  TECHNIQUE: Multidetector CT imaging of the abdomen and pelvis was performed using the standard protocol following bolus administration of intravenous contrast.  CONTRAST:  25mL OMNIPAQUE IOHEXOL 300 MG/ML SOLN, 172mL OMNIPAQUE IOHEXOL 300 MG/ML SOLN  COMPARISON:  February 06, 2013  FINDINGS: The patient is status post prior cholecystectomy. There is postsurgical intrahepatic biliary ductal dilatation. There is dilatation of the common bowel duct measuring 11.9 mm postsurgical. The liver is otherwise normal. The spleen, pancreas, adrenal glands and kidneys are normal. There is no hydronephrosis bilaterally. There is atherosclerosis of the abdominal aorta without aneurysmal dilatation. The PEG tube is identified in the stomach. Nasogastric tube is identified. There is a small hiatal hernia. There is no small bowel obstruction or diverticulitis. There is evidence of prior small bowel surgery in the left mid abdomen.  Fluid-filled bladder is normal. The patient is status post prior hysterectomy. Pelvic phleboliths are noted. The lung bases are clear. The patient is status post prior fixation of lower lumbar spine. No acute abnormalities identified within the visualized bones.  IMPRESSION: No acute abnormality identified in the abdomen and pelvis. There is no evidence of pancreatitis. The patient is status post prior cholecystectomy and hysterectomy.   Electronically Signed   By: Abelardo Diesel M.D.   On: 05/27/2013 16:01   Portable Chest 1 View  05/27/2013   CLINICAL DATA:  Chest pain  EXAM: PORTABLE CHEST - 1 VIEW  COMPARISON:  05/11/2013  FINDINGS: The heart size and mediastinal contours are within normal limits. Both lungs are clear. The visualized skeletal structures are unremarkable.  IMPRESSION: No active disease.   Electronically Signed   By: Daryll Brod  M.D.   On: 05/27/2013 17:05    Scheduled Meds: . acetaminophen (TYLENOL) oral liquid 160 mg/5 mL  650 mg Per Tube TID  . amLODipine  10 mg Per Tube Daily  . atorvastatin  40 mg Per Tube q1800  . busPIRone  5 mg Per Tube TID  . enoxaparin (LOVENOX) injection  40 mg Subcutaneous Q24H  . gabapentin  600 mg Oral TID  . [START ON 05/29/2013] insulin aspart  0-9 Units Subcutaneous Q6H  . insulin glargine  10 Units Subcutaneous QHS  . lisinopril  10 mg Oral Daily  . metoCLOPramide  10 mg Oral TID AC  . mometasone-formoterol  2 puff Inhalation BID  . pantoprazole sodium  40 mg Per Tube Daily  . sodium chloride  3  mL Intravenous Q12H  . sucralfate  1 g Per Tube TID WC & HS   Continuous Infusions: . sodium chloride 100 mL/hr at 05/28/13 1347    Principal Problem:   Intractable nausea and vomiting Active Problems:   Gastroparesis   Dyslipidemia   GERD (gastroesophageal reflux disease)   Hypertension   DM (diabetes mellitus), type 2, uncontrolled   Dehydration   Hyponatremia   Leukocytosis, unspecified   Abdominal pain   Diabetic neuropathy   Protein-calorie malnutrition, severe   Epigastric pain   Leukocytosis   Hyperglycemia   Hypochloremia    Time spent: 30 min    Aquarius Tremper, [place] [facility] Pager [phone]. If 7PM-7AM, please contact night-coverage at [email or web address], password [facility] 05/28/2013, 4:28 PM  LOS: 1 day

## 2013-05-28 NOTE — Progress Notes (Addendum)
This RN explained to patient that her nurse has sustained a needle stick from patients insulin syringe. Patient denies any history of HIV or hepatitis. Patient informed of need to test her blood for these labs and she is agreeable to this taking place. Night A/C Venia Carbon notified and she will be bringing paperwork to floor shortly.  Lemar Livings

## 2013-05-28 NOTE — Care Management Note (Addendum)
    Page 1 of 1   05/29/2013     3:07:35 PM   CARE MANAGEMENT NOTE 05/29/2013  Patient:  [name], [name]   Account Number:  [id number]  Date Initiated:  05/28/2013  Documentation initiated by:  Dessa Phi  Subjective/Objective Assessment:   42 Y/O F ADMITTED W/ABD PAIN/N/V.DM GASTROPARESIS.READMIT 10/1-10/7/14-GASTROPARESIS.HX:PEG.     Action/Plan:   FROM HOME.HAS PCP,PHARMACY.   Anticipated DC Date:  05/29/2013   Anticipated DC Plan:  LONG TERM ACUTE CARE (LTAC)      DC Planning Services  CM consult      Choice offered to / List presented to:  C-1 Patient           Status of service:  Completed, signed off Medicare Important Message given?   (If response is "NO", the following Medicare IM given date fields will be blank) Date Medicare IM given:   Date Additional Medicare IM given:    Discharge Disposition:  LONG TERM ACUTE CARE (LTAC)  Per UR Regulation:  Reviewed for med. necessity/level of care/duration of stay  If discussed at Long Length of Stay Meetings, dates discussed:    Comments:  05/29/13 Kathaleen Dudziak RN,BSN NCM [place] AGREED TO LTAC,THEN DECIDED THAT SHE DID NOT WANT TO GO,BUT WANTED TO GO HOME,BUT NO AVAILABLE CAREGIVER FOR HOME.MD UPDATED.FINALLY AGREED TO [facility] SHE HAS GONE BEFORE.LIASON MIKE FOR [facility] W/PATIENT,INFORMED HER THAT SHE WOULD HAVE THE SAME MD-DR.CROWLEY.[facility] IS ON FLOOR TO TAKE PATIENT.NURSE HAS CALLED REPORT.ALL FORMS COMPLETED.TRANSFER VIA [facility].  05/27/13 Jaspal Pultz RN,BSN NCM [phone]

## 2013-05-28 NOTE — Progress Notes (Signed)
INITIAL NUTRITION ASSESSMENT  DOCUMENTATION CODES Per approved criteria  -Not Applicable   INTERVENTION: Recommend providing 6 cans of Vital AF 1.2 daily. This will provide 1704 kcal, 107 grams protein, and 1152 ml of H2O. Recommend 80 ml free water flushes before and after each can.   NUTRITION DIAGNOSIS: Inadequate oral intake related to gastroparesis, nausea, vomiting as evidenced by pt with chronic PEG.   Goal: Pt to meet >/= 90% of their estimated nutrition needs   Monitor:  TF initiation/ tolerance Labs Weight  Reason for Assessment: Consult to Assess  42 y.o. female  Admitting Dx: Intractable nausea and vomiting  ASSESSMENT: 42 y.o. female has a past medical history significant for poorly controlled diabetes (last HBA1C 10.2 on Oct 1st), diabetic gastroparesis status post PEG tube, hypertension with multiple prior hospitalizations for treatment of gastroparesis flare. She presents today with a chief complaint of severe abdominal pain with nausea and vomiting for the past 12-24 hours. Pt awake upon entering the room but, pt closed her eyes and became nonverbal upon assessment. Pt nodded yes or no to a few questions. Pt confirms she was getting 6 cans of Vital AF 1.2 daily PTA. Pt also reports nausea going on for a while. Weight history shows pt's weight has been stable this past month and pt gained weight since September.   Height: Ht Readings from Last 1 Encounters:  05/27/13 5\' 6"  (1.676 m)    Weight: Wt Readings from Last 1 Encounters:  05/27/13 141 lb 12.1 oz (64.3 kg)    Ideal Body Weight: 130 lbs  % Ideal Body Weight: 108%  Wt Readings from Last 10 Encounters:  05/27/13 141 lb 12.1 oz (64.3 kg)  05/07/13 142 lb 13.7 oz (64.8 kg)  04/11/13 135 lb 2.3 oz (61.3 kg)  02/17/13 139 lb 1.8 oz (63.1 kg)  02/17/13 139 lb 1.8 oz (63.1 kg)  02/06/13 141 lb 1.5 oz (64 kg)  01/19/13 145 lb 4.5 oz (65.9 kg)  12/26/12 152 lb (68.947 kg)  12/26/12 152 lb (68.947 kg)   03/14/12 128 lb 1.4 oz (58.1 kg)    Usual Body Weight: 152 lbs (May 2014)  % Usual Body Weight: 93%  BMI:  Body mass index is 22.89 kg/(m^2).  Estimated Nutritional Needs: Kcal: 1700-1900 Protein: 90-110 grams Fluid: 2.1 L/day  Skin: intact  Diet Order: NPO  EDUCATION NEEDS: -No education needs identified at this time   Intake/Output Summary (Last 24 hours) at 05/28/13 1216 Last data filed at 05/28/13 1002  Gross per 24 hour  Intake   1420 ml  Output      0 ml  Net   1420 ml    Last BM: 10/26   Labs:   Recent Labs Lab 05/27/13 1120 05/27/13 2235 05/28/13 0510  NA 126* 131* 131*  K 3.7 3.6 4.2  CL 81* 95* 97  CO2 25 25 23   BUN 14 12 14   CREATININE 0.75 0.65 0.78  CALCIUM 10.5 8.2* 8.2*  GLUCOSE 359* 76 95    CBG (last 3)   Recent Labs  05/28/13 0011 05/28/13 0405 05/28/13 0722  GLUCAP 77 89 94    Scheduled Meds: . sodium chloride   Intravenous STAT  . amLODipine  10 mg Per Tube Daily  . atorvastatin  40 mg Per Tube q1800  . busPIRone  5 mg Per Tube TID  . enoxaparin (LOVENOX) injection  40 mg Subcutaneous Q24H  . gabapentin  600 mg Oral TID  . insulin aspart  0-9  Units Subcutaneous Q4H  . insulin glargine  10 Units Subcutaneous QHS  . lisinopril  10 mg Oral Daily  . mometasone-formoterol  2 puff Inhalation BID  . pantoprazole sodium  40 mg Per Tube Daily  . sodium chloride  3 mL Intravenous Q12H  . sucralfate  1 g Per Tube TID WC & HS    Continuous Infusions: . sodium chloride 100 mL/hr at 05/28/13 0441    Past Medical History  Diagnosis Date  . Hypertension   . Diabetes mellitus   . Gastroparesis   . Asthma   . GERD (gastroesophageal reflux disease)   . Coronary artery disease   . h/o seizure   . Neuropathy   . Chronic pancreatitis   . Dyslipidemia   . MI (myocardial infarction)   . COPD (chronic obstructive pulmonary disease)   . Seizures     Past Surgical History  Procedure Laterality Date  . Abdominal hysterectomy     . Cholecystectomy    . Peg tube x 4      feeding jejunostomies with PEG tubes  . Cesarean section    . Colon resection due to diverticulitis    . Back surgery    . Esophagogastroduodenoscopy N/A 12/27/2012    Procedure: ESOPHAGOGASTRODUODENOSCOPY (EGD);  Surgeon: Missy Sabins, MD;  Location: [facility];  Service: Endoscopy;  Laterality: N/A;  . Esophagogastroduodenoscopy (egd) with esophageal dilation N/A 02/19/2013    Procedure: ESOPHAGOGASTRODUODENOSCOPY (EGD) WITH ESOPHAGEAL DILATION;  Surgeon: Wonda Horner, MD;  Location: [facility];  Service: Endoscopy;  Laterality: N/A;  . Balloon dilation N/A 02/19/2013    Procedure: BALLOON DILATION;  Surgeon: Wonda Horner, MD;  Location: [facility];  Service: Endoscopy;  Laterality: N/A;    Pryor Ochoa RD, LDN Inpatient Clinical Dietitian Pager: [phone] After Hours Pager: [phone]

## 2013-05-28 NOTE — Progress Notes (Signed)
Patient's peripheral IV which had been placed using ultrasound infiltrated early this morning. Patient has very poor venous access and has had to have PICC lines placed in the past. IV team RN was able to get a 24g IV in the left hand, but says that a PICC line would be beneficial for this patient. Notified [facility] midlevel on call, no new orders. Will continue to monitor patient and try to preserve current IV site.   Marita Snellen

## 2013-05-28 NOTE — Progress Notes (Signed)
Inpatient Diabetes Program Recommendations  AACE/ADA: New Consensus Statement on Inpatient Glycemic Control (2013)  Target Ranges:  Prepandial:   less than 140 mg/dL      Peak postprandial:   less than 180 mg/dL (1-2 hours)      Critically ill patients:  140 - 180 mg/dL   Reason for Visit: Diabetes Consult for Uncontrolled Blood Sugars  42 y.o. female has a past medical history significant for poorly controlled diabetes (last HBA1C 10.2 on Oct 1st), diabetic gastroparesis status post PEG tube, hypertension with multiple prior hospitalizations for treatment of gastroparesis flare. She presents today with a chief complaint of severe abdominal pain with nausea and vomiting for the past 12-24 hours. She states that this is typical of her flare-ups. In the ED patient appears very uncomfortable. She states that she is strict NPO at home and uses her PEG tube at all times. She has a feeding pump.  Very familiar with pt from previous hospitalizations.  Results for DENNIS, LUGAR (MRN [id number]) as of 05/28/2013 09:10  Ref. Range 05/27/2013 17:41 05/27/2013 20:26 05/28/2013 00:11 05/28/2013 04:05 05/28/2013 07:22  Glucose-Capillary Latest Range: 70-99 mg/dL 203 (H) 78 77 89 94  Results for DARIELLE, SPADY (MRN [id number]) as of 05/28/2013 09:10  Ref. Range 05/27/2013 11:20 05/27/2013 22:35 05/28/2013 05:10  Glucose Latest Range: 70-99 mg/dL 359 (H) 76 95   Blood sugars much improved from yesterday.  Ketones negative.  Lantus 10 units QHS ordered to start today. (Pt on Lantus 15 units QD at home) Will likely need TF coverage - Novolog 3 units Q4.   Will follow daily. Thank you. Lorenda Peck, RD, LDN, CDE Inpatient Diabetes Coordinator [phone]

## 2013-05-29 DIAGNOSIS — N179 Acute kidney failure, unspecified: Secondary | ICD-10-CM

## 2013-05-29 DIAGNOSIS — E860 Dehydration: Secondary | ICD-10-CM

## 2013-05-29 DIAGNOSIS — R109 Unspecified abdominal pain: Secondary | ICD-10-CM

## 2013-05-29 DIAGNOSIS — Z431 Encounter for attention to gastrostomy: Secondary | ICD-10-CM

## 2013-05-29 LAB — CBC
Hemoglobin: 9.1 g/dL — ABNORMAL LOW (ref 12.0–15.0)
MCV: 87.5 fL (ref 78.0–100.0)
Platelets: 188 10*3/uL (ref 150–400)
RBC: 3.04 MIL/uL — ABNORMAL LOW (ref 3.87–5.11)
RDW: 14.9 % (ref 11.5–15.5)
WBC: 6.2 10*3/uL (ref 4.0–10.5)

## 2013-05-29 LAB — HEPATITIS C ANTIBODY: HCV Ab: NEGATIVE

## 2013-05-29 LAB — HIV RAPID SCREEN (BLD OR BODY FLD EXPOSURE): Rapid HIV Screen: NONREACTIVE

## 2013-05-29 LAB — BASIC METABOLIC PANEL
CO2: 25 mEq/L (ref 19–32)
Chloride: 103 mEq/L (ref 96–112)
GFR calc Af Amer: >90 mL/min (ref >90)
Potassium: 3.5 mEq/L (ref 3.5–5.1)
Sodium: 136 mEq/L (ref 135–145)

## 2013-05-29 LAB — GLUCOSE, CAPILLARY: Glucose-Capillary: 131 mg/dL — ABNORMAL HIGH (ref 70–99)

## 2013-05-29 MED ORDER — VITAL AF 1.2 CAL PO LIQD: 237.0000 mL | ORAL | Status: DC

## 2013-05-29 MED ORDER — HYDROMORPHONE HCL PF 1 MG/ML IJ SOLN: 0.5000 mg | INTRAMUSCULAR | Status: DC | PRN

## 2013-05-29 MED ORDER — ACETAMINOPHEN 160 MG/5ML PO SOLN: 650.0000 mg | Freq: Three times a day (TID) | ORAL | Status: DC

## 2013-05-29 MED ORDER — METOCLOPRAMIDE HCL 5 MG/5ML PO SOLN: 10.0000 mg | Freq: Three times a day (TID) | ORAL | Status: DC

## 2013-05-29 MED ORDER — ENOXAPARIN SODIUM 40 MG/0.4ML ~~LOC~~ SOLN: 40.0000 mg | SUBCUTANEOUS | Status: DC

## 2013-05-29 MED ORDER — SODIUM CHLORIDE 0.9 % IV SOLN: 100.0000 mL | INTRAVENOUS | Status: DC

## 2013-05-29 MED ORDER — INSULIN ASPART 100 UNIT/ML ~~LOC~~ SOLN: 0.0000 [IU] | SUBCUTANEOUS | Status: DC

## 2013-05-29 MED ORDER — TRAMADOL HCL 50 MG PO TABS: 50.0000 mg | ORAL_TABLET | Freq: Four times a day (QID) | ORAL | Status: DC | PRN

## 2013-05-29 MED ORDER — LORAZEPAM 2 MG/ML IJ SOLN: 0.5000 mg | Freq: Four times a day (QID) | INTRAMUSCULAR | Status: DC | PRN

## 2013-05-29 MED ORDER — INSULIN GLARGINE 100 UNIT/ML ~~LOC~~ SOLN: 10.0000 [IU] | Freq: Every day | SUBCUTANEOUS | Status: DC

## 2013-05-29 NOTE — Discharge Summary (Signed)
Physician Discharge Summary  [name] [id number] DOB: 05-14-1971 DOA: 05/27/2013  PCP: [name], MD  Admit date: 05/27/2013 Discharge date: 05/29/2013  Recommendations for Outpatient Follow-up:  1. Patient currently takes Dilaudid 0.5-1 mg every 3 hours IV as needed for pain control which does provide pain relief. We have temporarily stopped PO pain medications. 2. May continue Norvasc and lisinopril for blood pressure 3. Continue tube feeds as prescribed 4. May continue Protonix supplementation daily 5. Hemoglobin is 9.1, stable at the time of discharge 6. Electrolytes are all within normal limits at the time of discharge. Kidney function is within normal limits.  Discharge Diagnoses:  Principal Problem:   Intractable nausea and vomiting Active Problems:   Gastroparesis   Dyslipidemia   GERD (gastroesophageal reflux disease)   Hypertension   DM (diabetes mellitus), type 2, uncontrolled   Dehydration   Hyponatremia   Leukocytosis, unspecified   Abdominal pain   Diabetic neuropathy   Protein-calorie malnutrition, severe   Epigastric pain   Leukocytosis   Hyperglycemia   Hypochloremia    Discharge Condition: Patient is medically stable for transfer to LTAC  Diet recommendation: peg tube feedings   History of present illness:  42 y.o. female has a past medical history significant for poorly controlled diabetes (last HBA1C 10.2 on Oct 1st), diabetic gastroparesis status post PEG tube, hypertension with multiple prior hospitalizations for treatment of gastroparesis flare. She presented 05/27/2013 with a chief complaint of severe abdominal pain with nausea and vomiting that started 2 days prior to this admission. She reported that this was typical of her flare-ups. In the ED patient appeared very uncomfortable and [facility] asked to admit for further management.   Assessment/Plan:   Principal Problem:  Intractable nausea and vomiting  - secondary to gastroparesis  -  since pt slightly improving, will hold off on GI consult for now  - keep NPO, continue analgesia and antiemetics as needed  Active Problems:  Gastroparesis  - management as noted above, continue Reglan  GERD (gastroesophageal reflux disease)  - continue protonix per tube QD  Hypertension  - reasonable inpatient control  - continue Lisinopril and Norvasc  DM (diabetes mellitus), type 2, uncontrolled  - reasonable inpatient control  - continue Lantus and SSI  Dehydration  - evident by clinical status and hemoconcentration  - continuing IVF, Hg now at baseline and pt euvolemic on exam  Hyponatremia  - secondary to pre renal etiology and dehydration  - now resolved with IVF  - repeat BMP in AM  Leukocytosis, unspecified  - secondary to demargination and stress reaction, no signs of an infectious etiology  - now resolved, continue to monitor CBC  - pt afebrile over 24 hours  Diabetic neuropathy  - continue neurontin  Protein-calorie malnutrition, severe  - secondary to severe gastroparesis  - appreciate nutritionist recommendations   Code Status: Full  Family Communication: Pt at bedside   Leisa Lenz, MD  [facility]  Pager [phone]   Consultants:  GI Procedures:  Ct Abdomen Pelvis W Contrast 05/27/2013 No acute abnormality identified in the abdomen and pelvis. There is no evidence of pancreatitis. T  Portable Chest 1 View 05/27/2013 No active disease.  Antibiotics:  None   Signed:  Leisa Lenz, MD  [facility] 05/29/2013, 12:38 PM  Pager #: [phone]    Discharge Exam: Filed Vitals:   05/29/13 1232  BP: 120/75  Pulse: 80  Temp: 98.7 F (37.1 C)  Resp: 18   Filed Vitals:   05/28/13 2105  05/29/13 0500 05/29/13 0831 05/29/13 1232  BP: 116/70 120/75  120/75  Pulse: 79 80  80  Temp: 98.8 F (37.1 C) 98.7 F (37.1 C)  98.7 F (37.1 C)  TempSrc: Oral Oral    Resp: 18 18  18   Height:      Weight:      SpO2: 99% 99% 98% 98%     General: Pt is not in acute distress Cardiovascular: Regular rate and rhythm, S1/S2 appreciated Respiratory: Clear to auscultation bilaterally, no wheezing, no crackles, no rhonchi Abdominal: Soft, peg in place, very tender upper and mid abdomen even to slight palpation Extremities: no edema, no cyanosis, pulses palpable bilaterally DP and PT Neuro: Grossly nonfocal  Discharge Instructions  Discharge Orders   Future Orders Complete By Expires   Call MD for:  difficulty breathing, headache or visual disturbances  As directed    Call MD for:  persistant dizziness or light-headedness  As directed    Call MD for:  persistant nausea and vomiting  As directed    Call MD for:  severe uncontrolled pain  As directed    Diet - low sodium heart healthy  As directed    Discharge instructions  As directed    Comments:     1. Patient currently takes Dilaudid 0.5-1 mg every 3 hours IV as needed for pain control which does provide pain relief. We have temporarily stopped PO pain medications. 2. May continue Norvasc and lisinopril for blood pressure 3. Continue tube feeds as prescribed 4. May continue Protonix supplementation daily 5. Hemoglobin is 9.1, stable at the time of discharge 6. Electrolytes are all within normal limits at the time of discharge. Kidney function is within normal limits.   Increase activity slowly  As directed        Medication List    STOP taking these medications       HYDROmorphone 4 MG tablet  Commonly known as:  DILAUDID  Replaced by:  HYDROmorphone 1 MG/ML Soln injection     LORazepam 1 MG tablet  Commonly known as:  ATIVAN  Replaced by:  LORazepam 2 MG/ML injection     oxyCODONE 5 MG/5ML solution  Commonly known as:  ROXICODONE     zolpidem 10 MG tablet  Commonly known as:  AMBIEN      TAKE these medications       acetaminophen 160 MG/5ML solution  Commonly known as:  TYLENOL  Place 20.3 mLs (650 mg total) into feeding tube 3 (three) times daily.      amLODipine 10 MG tablet  Commonly known as:  NORVASC  Place 1 tablet (10 mg total) into feeding tube daily.     atorvastatin 40 MG tablet  Commonly known as:  LIPITOR  Place 1 tablet (40 mg total) into feeding tube daily at 6 PM.     busPIRone 5 MG tablet  Commonly known as:  BUSPAR  Place 5 mg into feeding tube 3 (three) times daily.     enoxaparin 40 MG/0.4ML injection  Commonly known as:  LOVENOX  Inject 0.4 mLs (40 mg total) into the skin daily.     feeding supplement (VITAL AF 1.2 CAL) Liqd  Place 119-237 mLs into feeding tube every 4 (four) hours.     feeding supplement (VITAL AF 1.2 CAL) Liqd  Place 237 mLs into feeding tube every 3 (three) hours while awake.     Fluticasone-Salmeterol 250-50 MCG/DOSE Aepb  Commonly known as:  ADVAIR  Inhale 1 puff  into the lungs 2 (two) times daily.     free water Soln  Place 60 mLs into feeding tube every 4 (four) hours.     gabapentin 600 MG tablet  Commonly known as:  NEURONTIN  Take 600 mg by mouth 3 (three) times daily.     HYDROmorphone 1 MG/ML Soln injection  Commonly known as:  DILAUDID  Inject 0.5-1 mLs (0.5-1 mg total) into the vein every 3 (three) hours as needed for severe pain.     insulin aspart 100 UNIT/ML injection  Commonly known as:  novoLOG  Inject 0-9 Units into the skin every 4 (four) hours.     insulin glargine 100 UNIT/ML injection  Commonly known as:  LANTUS  Inject 0.1 mLs (10 Units total) into the skin at bedtime.     lisinopril 10 MG tablet  Commonly known as:  PRINIVIL,ZESTRIL  10 mg. 1 tablet (10 mg total) by Per J Tube route daily.     LORazepam 2 MG/ML injection  Commonly known as:  ATIVAN  Inject 0.25 mLs (0.5 mg total) into the vein every 6 (six) hours as needed for anxiety (nausea).     metoCLOPramide 5 MG/5ML solution  Commonly known as:  REGLAN  Take 10 mLs (10 mg total) by mouth 3 (three) times daily before meals.     multivitamin Liqd  Place 5 mLs into feeding tube daily.      pantoprazole sodium 40 mg/20 mL Pack  Commonly known as:  PROTONIX  Place 40 mg into feeding tube daily.     promethazine 25 MG suppository  Commonly known as:  PHENERGAN  Place 25 mg rectally every 6 (six) hours as needed for nausea.     sodium chloride 0.9 % infusion  Inject 100 mLs into the vein continuous.     sucralfate 1 GM/10ML suspension  Commonly known as:  CARAFATE  Place 10 mLs (1 g total) into feeding tube 4 (four) times daily -  with meals and at bedtime.     traMADol 50 MG tablet  Commonly known as:  ULTRAM  Take 1-2 tablets (50-100 mg total) by mouth every 6 (six) hours as needed.           Follow-up Information   Follow up with [name], MD.   Specialty:  Internal Medicine   Contact information:   [address] [place] [address]        The results of significant diagnostics from this hospitalization (including imaging, microbiology, ancillary and laboratory) are listed below for reference.    Significant Diagnostic Studies: Ct Abdomen Pelvis W Contrast  05/27/2013   CLINICAL DATA:  Epigastric pain, vomiting  EXAM: CT ABDOMEN AND PELVIS WITH CONTRAST  TECHNIQUE: Multidetector CT imaging of the abdomen and pelvis was performed using the standard protocol following bolus administration of intravenous contrast.  CONTRAST:  43mL OMNIPAQUE IOHEXOL 300 MG/ML SOLN, 181mL OMNIPAQUE IOHEXOL 300 MG/ML SOLN  COMPARISON:  February 06, 2013  FINDINGS: The patient is status post prior cholecystectomy. There is postsurgical intrahepatic biliary ductal dilatation. There is dilatation of the common bowel duct measuring 11.9 mm postsurgical. The liver is otherwise normal. The spleen, pancreas, adrenal glands and kidneys are normal. There is no hydronephrosis bilaterally. There is atherosclerosis of the abdominal aorta without aneurysmal dilatation. The PEG tube is identified in the stomach. Nasogastric tube is identified. There is a small hiatal hernia. There is no small  bowel obstruction or diverticulitis. There is evidence of prior small bowel surgery  in the left mid abdomen.  Fluid-filled bladder is normal. The patient is status post prior hysterectomy. Pelvic phleboliths are noted. The lung bases are clear. The patient is status post prior fixation of lower lumbar spine. No acute abnormalities identified within the visualized bones.  IMPRESSION: No acute abnormality identified in the abdomen and pelvis. There is no evidence of pancreatitis. The patient is status post prior cholecystectomy and hysterectomy.   Electronically Signed   By: Abelardo Diesel M.D.   On: 05/27/2013 16:01   Ir Fluoro Guide Cv Line Right  05/03/2013   CLINICAL DATA:  Gastroparesis. Needs access for TPN.  EXAM: PICC PLACEMENT WITH ULTRASOUND AND FLUOROSCOPY  TECHNIQUE: After written informed consent was obtained, patient was placed in the supine position on angiographic table. Patency of the right basilic vein was confirmed with ultrasound with image documentation. An appropriate skin site was determined. Skin site was marked. Region was prepped using maximum barrier technique including cap and mask, sterile gown, sterile gloves, large sterile sheet, and Chlorhexidine as cutaneous antisepsis. The region was infiltrated locally with 1% lidocaine. Under real-time ultrasound guidance, the right basilic vein was accessed with a 21 gauge micropuncture needle; the needle tip within the vein was confirmed with ultrasound image documentation. Needle exchanged over a 018 guidewire for a peel-away sheath, through which a 5-French double-lumen power injectable PICC trimmed to 40cm was advanced, positioned with its tip near the cavoatrial junction. Spot chest radiograph confirms appropriate catheter position. Catheter was flushed per protocol and secured externally with 0-Prolene sutures. The patient tolerated procedure well, with no immediate complication.  FLUOROSCOPY TIME:  Three seconds  IMPRESSION: Technically  successful five [place] double lumen power injectable PICC placement   Electronically Signed   By: Arne Cleveland M.D.   On: 05/03/2013 15:34   Ir Replc Gastro/colonic Tube Percut W/fluoro  05/01/2013   CLINICAL DATA:  Malpositioned GJ tube. Jejunal tube has refluxed into the esophagus.  EXAM: GASTROSTOMY CATHETER REPLACEMENT  Physician: Stephan Minister. Anselm Pancoast, MD  MEDICATIONS: None  ANESTHESIA/SEDATION: Moderate sedation time: None  FLUOROSCOPY TIME:  10 min and 18 seconds  PROCEDURE: The anterior abdomen was prepped and draped in a sterile fashion. Maximal barrier sterile technique was utilized including caps, mask, sterile gowns, sterile gloves, sterile drape, hand hygiene and skin antiseptic. The existing tube was completely removed. A C2 catheter and Kumpe catheter were advanced into the stomach. Air was injected in the stomach to identify the distal aspect of the stomach and possibly the small bowel. Neither a catheter or wire could be advanced outside of the stomach. As a result, an 40 French balloon retention gastrostomy tube was placed. Balloon was inflated with 8 mL of saline.  COMPLICATIONS: None  FINDINGS: Unable to cannulate the distal stomach or small bowel. As result, a GJ tube could not be placed. A 18 French balloon retention gastrostomy tube was placed.  IMPRESSION: Unsuccessful attempt at replacing the [place] tube. A new gastrostomy tube was placed. Consider catheter placement with endoscopy.   Electronically Signed   By: Markus Daft M.D.   On: 05/01/2013 17:01   Ir US Guide Vasc Access Right  05/03/2013   CLINICAL DATA:  Gastroparesis. Needs access for TPN.  EXAM: PICC PLACEMENT WITH ULTRASOUND AND FLUOROSCOPY  TECHNIQUE: After written informed consent was obtained, patient was placed in the supine position on angiographic table. Patency of the right basilic vein was confirmed with ultrasound with image documentation. An appropriate skin site was determined. Skin site was marked.  Region was prepped using  maximum barrier technique including cap and mask, sterile gown, sterile gloves, large sterile sheet, and Chlorhexidine as cutaneous antisepsis. The region was infiltrated locally with 1% lidocaine. Under real-time ultrasound guidance, the right basilic vein was accessed with a 21 gauge micropuncture needle; the needle tip within the vein was confirmed with ultrasound image documentation. Needle exchanged over a 018 guidewire for a peel-away sheath, through which a 5-French double-lumen power injectable PICC trimmed to 40cm was advanced, positioned with its tip near the cavoatrial junction. Spot chest radiograph confirms appropriate catheter position. Catheter was flushed per protocol and secured externally with 0-Prolene sutures. The patient tolerated procedure well, with no immediate complication.  FLUOROSCOPY TIME:  Three seconds  IMPRESSION: Technically successful five [place] double lumen power injectable PICC placement   Electronically Signed   By: Arne Cleveland M.D.   On: 05/03/2013 15:34   Portable Chest 1 View  05/27/2013   CLINICAL DATA:  Chest pain  EXAM: PORTABLE CHEST - 1 VIEW  COMPARISON:  05/11/2013  FINDINGS: The heart size and mediastinal contours are within normal limits. Both lungs are clear. The visualized skeletal structures are unremarkable.  IMPRESSION: No active disease.   Electronically Signed   By: Daryll Brod M.D.   On: 05/27/2013 17:05   Dg Abd Acute W/chest  05/11/2013   CLINICAL DATA:  Abdominal pain with nausea and vomiting.  EXAM: ACUTE ABDOMEN SERIES (ABDOMEN 2 VIEW & CHEST 1 VIEW)  COMPARISON:  05/01/2013 and prior radiographs  FINDINGS: The cardiomediastinal silhouette is unremarkable.  There is no evidence of focal airspace disease, pulmonary edema, suspicious pulmonary nodule/mass, pleural effusion, or pneumothorax. No acute bony abnormalities are identified.  A few air-fluid levels are noted within the right colon, but without distention.  No dilated small bowel loops  are present.  There is no evidence of pneumoperitoneum.  A PEG tube and cholecystectomy clips are noted.  No suspicious calcifications are identified.  Lower lumbar surgical changes are identified.  IMPRESSION: Air-fluid levels within the right colon which could represent a diarrheal state. Otherwise unremarkable bowel gas pattern. No evidence of bowel obstruction or pneumoperitoneum.  No evidence of acute cardiopulmonary disease.   Electronically Signed   By: Hassan Rowan M.D.   On: 05/11/2013 18:49   Dg Abd Acute W/chest  05/01/2013   CLINICAL DATA:  Severe abdominal pain this morning, history pancreatitis, hypertension, diabetes, GERD, asthma, coronary artery disease post MI, COPD  EXAM: ACUTE ABDOMEN SERIES (ABDOMEN 2 VIEW & CHEST 1 VIEW)  COMPARISON:  04/11/2013  Correlation: CT abdomen 02/06/2013  FINDINGS: Normal heart size, mediastinal contours, and pulmonary vascularity.  Bronchitic changes without infiltrate, pleural effusion or pneumothorax.  Bones demineralized.  Normal bowel gas pattern.  No bowel dilatation, bowel wall thickening, or free intraperitoneal air.  Evidence of prior bowel surgery in the left upper quadrant.  Catheter projects over the stomach and mid/distal esophagus ; this was coiled within the stomach on a prior CT.  Post laminectomies of L4 and L5 with Ray cage fusion of L4-L5.  Scattered pelvic phleboliths without urinary tract calcification.  IMPRESSION: No acute cardiopulmonary abnormalities.  Normal bowel gas pattern.  Catheter projects over the stomach and mid to distal esophagus ; prior CT suggests this is a gastrostomy tube, the tip of which is now within the esophagus rather than within the stomach, consider repositioning.   Electronically Signed   By: Lavonia Dana M.D.   On: 05/01/2013 10:08    Microbiology: No  results found for this or any previous visit (from the past 240 hour(s)).   Labs: Basic Metabolic Panel:  Recent Labs Lab 05/27/13 1120 05/27/13 2235  05/28/13 0510 05/29/13 0523  NA 126* 131* 131* 136  K 3.7 3.6 4.2 3.5  CL 81* 95* 97 103  CO2 25 25 23 25   GLUCOSE 359* 76 95 85  BUN 14 12 14 13   CREATININE 0.75 0.65 0.78 0.67  CALCIUM 10.5 8.2* 8.2* 7.9*   Liver Function Tests:  Recent Labs Lab 05/27/13 1120 05/28/13 0510  AST 18 14  ALT 26 14  ALKPHOS 138* 76  BILITOT 0.4 0.2*  PROT 9.1* 5.8*  ALBUMIN 3.9 2.4*    Recent Labs Lab 05/27/13 1120  LIPASE 20   No results found for this basename: AMMONIA,  in the last 168 hours CBC:  Recent Labs Lab 05/27/13 1120 05/28/13 0510 05/28/13 1332 05/29/13 0523  WBC 16.9* 8.1 6.6 6.2  NEUTROABS 14.4*  --   --   --   HGB 13.5 9.4* 9.0* 9.1*  HCT 38.5 27.0* 26.0* 26.6*  MCV 85.4 86.0 87.5 87.5  PLT 395 257 222 188   Cardiac Enzymes:  Recent Labs Lab 05/27/13 1120  TROPONINI <0.30   BNP: BNP (last 3 results) No results found for this basename: PROBNP,  in the last 8760 hours CBG:  Recent Labs Lab 05/28/13 1945 05/29/13 0040 05/29/13 0413 05/29/13 0806 05/29/13 1214  GLUCAP 225* 138* 92 131* 156*    Time coordinating discharge: Over 30 minutes

## 2013-05-29 NOTE — Progress Notes (Signed)
Called [facility] and gave report to [place], Therapist, sports.  [facility] called to transport patient.

## 2013-05-29 NOTE — Progress Notes (Signed)
Patient ID: [name], female   DOB: [birth year], 42 y.o.   MRN: [id number] [facility] PROGRESS NOTE  LATANZA MECKLEY [id number] DOB: April 25, 1971 DOA: 05/27/2013 PCP: [name], MD  Brief narrative: 42 y.o. female has a past medical history significant for poorly controlled diabetes (last HBA1C 10.2 on Oct 1st), diabetic gastroparesis status post PEG tube, hypertension with multiple prior hospitalizations for treatment of gastroparesis flare. She presented 05/27/2013 with a chief complaint of severe abdominal pain with nausea and vomiting that started 2 days prior to this admission. She reported that this was typical of her flare-ups. In the ED patient appeared very uncomfortable and [facility] asked to admit for further management.   Assessment/Plan:  Principal Problem:   Intractable nausea and vomiting - secondary to gastroparesis - since pt slightly improving, will hold off on GI consult for now  - keep NPO, continue analgesia and antiemetics as needed  Active Problems:   Gastroparesis - management as noted above, continue Reglan     GERD (gastroesophageal reflux disease) - continue protonix per tube QD   Hypertension - reasonable inpatient control - continue Lisinopril and Norvasc    DM (diabetes mellitus), type 2, uncontrolled - reasonable inpatient control  - continue Lantus and SSI    Dehydration - evident by clinical status and hemoconcentration - continuing IVF, Hg now at baseline and pt euvolemic on exam    Hyponatremia - secondary to pre renal etiology and dehydration - now resolved with IVF - repeat BMP in AM   Leukocytosis, unspecified - secondary to demargination and stress reaction, no signs of an infectious etiology - now resolved, continue to monitor CBC  - pt afebrile over 24 hours    Diabetic neuropathy - continue neurontin    Protein-calorie malnutrition, severe - secondary to severe gastroparesis - appreciate nutritionist recommendations  Code  Status: Full Family Communication: Pt at bedside  Disposition Plan: Inpatient for now   Leisa Lenz, MD  [facility] Pager [phone]  If 7PM-7AM, please contact night-coverage [email or web address] Password [email or web address] 05/29/2013, 10:28 AM   LOS: 2 days   Consultants:  GI  Procedures:  Ct Abdomen Pelvis W Contrast  05/27/2013  No acute abnormality identified in the abdomen and pelvis. There is no evidence of pancreatitis. T  Portable Chest 1 View   05/27/2013  No active disease.     Antibiotics:  None  HPI/Subjective: No events over 24 hours, pt reports feeling slightly better this AM.   Objective: Filed Vitals:   05/28/13 1556 05/28/13 2105 05/29/13 0500 05/29/13 0831  BP: 118/68 116/70 120/75   Pulse: 88 79 80   Temp: 98.7 F (37.1 C) 98.8 F (37.1 C) 98.7 F (37.1 C)   TempSrc: Oral Oral Oral   Resp: 18 18 18    Height:      Weight:      SpO2: 100% 99% 99% 98%    Intake/Output Summary (Last 24 hours) at 05/29/13 1028 Last data filed at 05/29/13 0846  Gross per 24 hour  Intake 3840.67 ml  Output      0 ml  Net 3840.67 ml    Exam:   General:  Pt is alert, follows commands appropriately, not in acute distress  Cardiovascular: Regular rate and rhythm, S1/S2, no murmurs, no rubs, no gallops  Respiratory: Clear to auscultation bilaterally, no wheezing, no crackles, no rhonchi  Abdomen: Soft, tender in epigastric area, non distended, bowel sounds present, no guarding  Extremities: No edema, pulses DP and PT palpable bilaterally  Neuro: Grossly nonfocal  Data Reviewed: Basic Metabolic Panel:  Recent Labs Lab 05/27/13 1120 05/27/13 2235 05/28/13 0510 05/29/13 0523  NA 126* 131* 131* 136  K 3.7 3.6 4.2 3.5  CL 81* 95* 97 103  CO2 25 25 23 25   GLUCOSE 359* 76 95 85  BUN 14 12 14 13   CREATININE 0.75 0.65 0.78 0.67  CALCIUM 10.5 8.2* 8.2* 7.9*   Liver Function Tests:  Recent Labs Lab 05/27/13 1120 05/28/13 0510  AST 18 14  ALT 26 14   ALKPHOS 138* 76  BILITOT 0.4 0.2*  PROT 9.1* 5.8*  ALBUMIN 3.9 2.4*    Recent Labs Lab 05/27/13 1120  LIPASE 20   CBC:  Recent Labs Lab 05/27/13 1120 05/28/13 0510 05/28/13 1332 05/29/13 0523  WBC 16.9* 8.1 6.6 6.2  NEUTROABS 14.4*  --   --   --   HGB 13.5 9.4* 9.0* 9.1*  HCT 38.5 27.0* 26.0* 26.6*  MCV 85.4 86.0 87.5 87.5  PLT 395 257 222 188   Cardiac Enzymes:  Recent Labs Lab 05/27/13 1120  TROPONINI <0.30   CBG:  Recent Labs Lab 05/28/13 1711 05/28/13 1945 05/29/13 0040 05/29/13 0413 05/29/13 0806  GLUCAP 90 225* 138* 92 131*   Scheduled Meds: . acetaminophen (TYLENOL) oral liquid 160 mg/5 mL  650 mg Per Tube TID  . amLODipine  10 mg Per Tube Daily  . atorvastatin  40 mg Per Tube q1800  . busPIRone  5 mg Per Tube TID  . enoxaparin (LOVENOX) injection  40 mg Subcutaneous Q24H  . feeding supplement (VITAL AF 1.2 CAL)  237 mL Per Tube Q3H while awake  . gabapentin  600 mg Oral TID  . insulin aspart  0-9 Units Subcutaneous Q4H  . insulin glargine  10 Units Subcutaneous QHS  . lisinopril  10 mg Oral Daily  . metoCLOPramide  10 mg Oral TID AC  . mometasone-formoterol  2 puff Inhalation BID  . pantoprazole sodium  40 mg Per Tube Daily  . sodium chloride  3 mL Intravenous Q12H  . sucralfate  1 g Per Tube TID WC & HS   Continuous Infusions: . sodium chloride 100 mL/hr at 05/29/13 0046

## 2013-05-29 NOTE — Progress Notes (Signed)
Came to visit patient at bedside on behalf [facility]. She is a COPD GOLD patient that attempts had been made after last hospital discharge to reach patient. Was unable to reach her at the number that she provided. Nevertheless, spoke with [name] on her way out with [facility] to go to [employer]. Confirmed her contact number to call once she does return home as [phone].  [name], MSN- Ed, [facility] [phone]

## 2013-07-30 ENCOUNTER — Inpatient Hospital Stay (HOSPITAL_COMMUNITY)
Admission: EM | Admit: 2013-07-30 | Discharge: 2013-08-03 | DRG: 073 | Disposition: A | Discharge status: Home / Self Care | Payer: Medicare Other | Attending: Internal Medicine | Admitting: Internal Medicine

## 2013-07-30 ENCOUNTER — Encounter (HOSPITAL_COMMUNITY): Payer: Self-pay | Admitting: Emergency Medicine

## 2013-07-30 ENCOUNTER — Inpatient Hospital Stay (HOSPITAL_COMMUNITY): Payer: Medicare Other

## 2013-07-30 ENCOUNTER — Emergency Department (HOSPITAL_COMMUNITY): Payer: Medicare Other

## 2013-07-30 DIAGNOSIS — Z880 Allergy status to penicillin: Secondary | ICD-10-CM

## 2013-07-30 DIAGNOSIS — K219 Gastro-esophageal reflux disease without esophagitis: Secondary | ICD-10-CM | POA: Diagnosis present

## 2013-07-30 DIAGNOSIS — Z79899 Other long term (current) drug therapy: Secondary | ICD-10-CM

## 2013-07-30 DIAGNOSIS — F1193 Opioid use, unspecified with withdrawal: Secondary | ICD-10-CM

## 2013-07-30 DIAGNOSIS — F1123 Opioid dependence with withdrawal: Secondary | ICD-10-CM | POA: Diagnosis present

## 2013-07-30 DIAGNOSIS — Z9049 Acquired absence of other specified parts of digestive tract: Secondary | ICD-10-CM

## 2013-07-30 DIAGNOSIS — Z87891 Personal history of nicotine dependence: Secondary | ICD-10-CM

## 2013-07-30 DIAGNOSIS — K920 Hematemesis: Secondary | ICD-10-CM

## 2013-07-30 DIAGNOSIS — K3184 Gastroparesis: Secondary | ICD-10-CM

## 2013-07-30 DIAGNOSIS — IMO0002 Reserved for concepts with insufficient information to code with codable children: Secondary | ICD-10-CM

## 2013-07-30 DIAGNOSIS — E43 Unspecified severe protein-calorie malnutrition: Secondary | ICD-10-CM | POA: Diagnosis present

## 2013-07-30 DIAGNOSIS — D72829 Elevated white blood cell count, unspecified: Secondary | ICD-10-CM

## 2013-07-30 DIAGNOSIS — R110 Nausea: Secondary | ICD-10-CM

## 2013-07-30 DIAGNOSIS — Z6826 Body mass index (BMI) 26.0-26.9, adult: Secondary | ICD-10-CM

## 2013-07-30 DIAGNOSIS — E860 Dehydration: Secondary | ICD-10-CM

## 2013-07-30 DIAGNOSIS — R1110 Vomiting, unspecified: Secondary | ICD-10-CM

## 2013-07-30 DIAGNOSIS — K567 Ileus, unspecified: Secondary | ICD-10-CM

## 2013-07-30 DIAGNOSIS — R739 Hyperglycemia, unspecified: Secondary | ICD-10-CM

## 2013-07-30 DIAGNOSIS — J4489 Other specified chronic obstructive pulmonary disease: Secondary | ICD-10-CM | POA: Diagnosis present

## 2013-07-30 DIAGNOSIS — E1140 Type 2 diabetes mellitus with diabetic neuropathy, unspecified: Secondary | ICD-10-CM

## 2013-07-30 DIAGNOSIS — R112 Nausea with vomiting, unspecified: Secondary | ICD-10-CM | POA: Diagnosis present

## 2013-07-30 DIAGNOSIS — E785 Hyperlipidemia, unspecified: Secondary | ICD-10-CM

## 2013-07-30 DIAGNOSIS — I10 Essential (primary) hypertension: Secondary | ICD-10-CM

## 2013-07-30 DIAGNOSIS — Z794 Long term (current) use of insulin: Secondary | ICD-10-CM

## 2013-07-30 DIAGNOSIS — Z765 Malingerer [conscious simulation]: Secondary | ICD-10-CM

## 2013-07-30 DIAGNOSIS — I252 Old myocardial infarction: Secondary | ICD-10-CM

## 2013-07-30 DIAGNOSIS — E1165 Type 2 diabetes mellitus with hyperglycemia: Secondary | ICD-10-CM

## 2013-07-30 DIAGNOSIS — R1013 Epigastric pain: Secondary | ICD-10-CM

## 2013-07-30 DIAGNOSIS — R109 Unspecified abdominal pain: Secondary | ICD-10-CM

## 2013-07-30 DIAGNOSIS — Z931 Gastrostomy status: Secondary | ICD-10-CM

## 2013-07-30 DIAGNOSIS — E871 Hypo-osmolality and hyponatremia: Secondary | ICD-10-CM

## 2013-07-30 DIAGNOSIS — F1920 Other psychoactive substance dependence, uncomplicated: Secondary | ICD-10-CM | POA: Diagnosis present

## 2013-07-30 DIAGNOSIS — E1149 Type 2 diabetes mellitus with other diabetic neurological complication: Principal | ICD-10-CM | POA: Diagnosis present

## 2013-07-30 DIAGNOSIS — G589 Mononeuropathy, unspecified: Secondary | ICD-10-CM | POA: Diagnosis present

## 2013-07-30 DIAGNOSIS — F19939 Other psychoactive substance use, unspecified with withdrawal, unspecified: Secondary | ICD-10-CM | POA: Diagnosis not present

## 2013-07-30 DIAGNOSIS — K861 Other chronic pancreatitis: Secondary | ICD-10-CM | POA: Diagnosis present

## 2013-07-30 DIAGNOSIS — I2510 Atherosclerotic heart disease of native coronary artery without angina pectoris: Secondary | ICD-10-CM | POA: Diagnosis present

## 2013-07-30 DIAGNOSIS — J449 Chronic obstructive pulmonary disease, unspecified: Secondary | ICD-10-CM | POA: Diagnosis present

## 2013-07-30 DIAGNOSIS — R042 Hemoptysis: Secondary | ICD-10-CM | POA: Diagnosis not present

## 2013-07-30 DIAGNOSIS — E876 Hypokalemia: Secondary | ICD-10-CM

## 2013-07-30 LAB — CBC WITH DIFFERENTIAL/PLATELET
Basophils Absolute: 0 10*3/uL (ref 0.0–0.1)
Eosinophils Relative: 0 % (ref 0–5)
Hemoglobin: 13.8 g/dL (ref 12.0–15.0)
Lymphocytes Relative: 11 % — ABNORMAL LOW (ref 12–46)
MCV: 86.9 fL (ref 78.0–100.0)
Monocytes Relative: 4 % (ref 3–12)
Neutrophils Relative %: 85 % — ABNORMAL HIGH (ref 43–77)
Platelets: 341 10*3/uL (ref 150–400)
RBC: 4.51 MIL/uL (ref 3.87–5.11)
RDW: 14 % (ref 11.5–15.5)
WBC: 19.5 10*3/uL — ABNORMAL HIGH (ref 4.0–10.5)

## 2013-07-30 LAB — COMPREHENSIVE METABOLIC PANEL
ALT: 9 U/L (ref 0–35)
AST: 16 U/L (ref 0–37)
Albumin: 3.3 g/dL — ABNORMAL LOW (ref 3.5–5.2)
CO2: 21 mEq/L (ref 19–32)
Chloride: 85 mEq/L — ABNORMAL LOW (ref 96–112)
Creatinine, Ser: 0.76 mg/dL (ref 0.50–1.10)
GFR calc Af Amer: >90 mL/min (ref >90)
GFR calc non Af Amer: >90 mL/min (ref >90)
Potassium: 4 mEq/L (ref 3.7–5.3)
Sodium: 129 mEq/L — ABNORMAL LOW (ref 137–147)
Total Bilirubin: 0.3 mg/dL (ref 0.3–1.2)
Total Protein: 8.4 g/dL — ABNORMAL HIGH (ref 6.0–8.3)

## 2013-07-30 LAB — URINE MICROSCOPIC-ADD ON

## 2013-07-30 LAB — URINALYSIS, ROUTINE W REFLEX MICROSCOPIC
Leukocytes, UA: NEGATIVE
Nitrite: NEGATIVE
Protein, ur: >300 mg/dL — AB
Specific Gravity, Urine: 1.025 (ref 1.005–1.030)
Urobilinogen, UA: 0.2 mg/dL (ref 0.0–1.0)
pH: 6 (ref 5.0–8.0)

## 2013-07-30 LAB — GLUCOSE, CAPILLARY: Glucose-Capillary: 410 mg/dL — ABNORMAL HIGH (ref 70–99)

## 2013-07-30 LAB — HEMOGLOBIN A1C: Mean Plasma Glucose: 146 mg/dL — ABNORMAL HIGH (ref <117)

## 2013-07-30 LAB — OCCULT BLOOD GASTRIC / DUODENUM (SPECIMEN CUP): pH, Gastric: 1

## 2013-07-30 MED ORDER — SODIUM CHLORIDE 0.9 % IV SOLN
INTRAVENOUS | Status: DC
  Administered 2013-07-30 – 2013-08-01 (×3): via INTRAVENOUS

## 2013-07-30 MED ORDER — IOHEXOL 300 MG/ML  SOLN
50.0000 mL | Freq: Once | INTRAMUSCULAR | Status: AC | PRN
  Administered 2013-07-30: 50 mL via ORAL

## 2013-07-30 MED ORDER — PROMETHAZINE HCL 25 MG/ML IJ SOLN
12.5000 mg | INTRAMUSCULAR | Status: DC | PRN
  Administered 2013-07-30 – 2013-07-31 (×2): 12.5 mg via INTRAVENOUS
  Filled 2013-07-30 (×4): qty 1

## 2013-07-30 MED ORDER — SUCRALFATE 1 GM/10ML PO SUSP
1.0000 g | Freq: Three times a day (TID) | ORAL | Status: DC
  Administered 2013-07-30 – 2013-08-03 (×13): 1 g
  Filled 2013-07-30 (×19): qty 10

## 2013-07-30 MED ORDER — LABETALOL HCL 5 MG/ML IV SOLN
5.0000 mg | Freq: Four times a day (QID) | INTRAVENOUS | Status: DC | PRN
  Filled 2013-07-30: qty 4

## 2013-07-30 MED ORDER — FREE WATER
60.0000 mL | Status: DC
  Administered 2013-07-30 – 2013-07-31 (×5): 60 mL

## 2013-07-30 MED ORDER — HYDROMORPHONE HCL PF 1 MG/ML IJ SOLN
1.0000 mg | Freq: Once | INTRAMUSCULAR | Status: AC
  Administered 2013-07-30: 1 mg via INTRAVENOUS
  Filled 2013-07-30: qty 1

## 2013-07-30 MED ORDER — SODIUM CHLORIDE 0.9 % IV BOLUS (SEPSIS)
1000.0000 mL | Freq: Once | INTRAVENOUS | Status: AC
  Administered 2013-07-30: 1000 mL via INTRAVENOUS

## 2013-07-30 MED ORDER — HYDROMORPHONE HCL PF 1 MG/ML IJ SOLN
1.0000 mg | Freq: Once | INTRAMUSCULAR | Status: AC
  Administered 2013-07-30: 1 mg via INTRAMUSCULAR

## 2013-07-30 MED ORDER — LORAZEPAM 2 MG/ML IJ SOLN: 0.5000 mg | Freq: Four times a day (QID) | INTRAMUSCULAR | Status: DC | PRN

## 2013-07-30 MED ORDER — ADULT MULTIVITAMIN LIQUID CH
5.0000 mL | Freq: Every day | ORAL | Status: DC
  Administered 2013-07-30 – 2013-08-03 (×5): 5 mL
  Filled 2013-07-30 (×5): qty 5

## 2013-07-30 MED ORDER — HYDROMORPHONE HCL PF 1 MG/ML IJ SOLN
1.0000 mg | Freq: Once | INTRAMUSCULAR | Status: DC
  Filled 2013-07-30: qty 1

## 2013-07-30 MED ORDER — VITAL AF 1.2 CAL PO LIQD
237.0000 mL | ORAL | Status: DC
  Administered 2013-07-30: 237 mL
  Filled 2013-07-30 (×8): qty 237

## 2013-07-30 MED ORDER — LISINOPRIL 10 MG PO TABS
10.0000 mg | ORAL_TABLET | Freq: Every day | ORAL | Status: DC
  Administered 2013-07-30 – 2013-08-03 (×5): 10 mg
  Filled 2013-07-30 (×5): qty 1

## 2013-07-30 MED ORDER — ENOXAPARIN SODIUM 40 MG/0.4ML ~~LOC~~ SOLN
40.0000 mg | SUBCUTANEOUS | Status: DC
  Administered 2013-07-30 – 2013-08-02 (×4): 40 mg via SUBCUTANEOUS
  Filled 2013-07-30 (×7): qty 0.4

## 2013-07-30 MED ORDER — PROMETHAZINE HCL 25 MG/ML IJ SOLN
25.0000 mg | Freq: Once | INTRAMUSCULAR | Status: DC
  Filled 2013-07-30: qty 1

## 2013-07-30 MED ORDER — MIRTAZAPINE 15 MG PO TABS
15.0000 mg | ORAL_TABLET | Freq: Every day | ORAL | Status: DC
  Administered 2013-07-30 – 2013-08-01 (×4): 15 mg
  Filled 2013-07-30 (×8): qty 1

## 2013-07-30 MED ORDER — MOMETASONE FURO-FORMOTEROL FUM 100-5 MCG/ACT IN AERO
2.0000 | INHALATION_SPRAY | Freq: Two times a day (BID) | RESPIRATORY_TRACT | Status: DC
  Administered 2013-07-30 – 2013-08-03 (×7): 2 via RESPIRATORY_TRACT
  Filled 2013-07-30: qty 8.8

## 2013-07-30 MED ORDER — BUSPIRONE HCL 5 MG PO TABS
5.0000 mg | ORAL_TABLET | Freq: Three times a day (TID) | ORAL | Status: DC
  Administered 2013-07-30 – 2013-08-03 (×11): 5 mg
  Filled 2013-07-30 (×14): qty 1

## 2013-07-30 MED ORDER — AMLODIPINE BESYLATE 10 MG PO TABS
10.0000 mg | ORAL_TABLET | Freq: Every day | ORAL | Status: DC
  Administered 2013-07-30 – 2013-07-31 (×2): 10 mg
  Filled 2013-07-30 (×2): qty 1

## 2013-07-30 MED ORDER — HYDROMORPHONE HCL PF 1 MG/ML IJ SOLN
1.0000 mg | INTRAMUSCULAR | Status: DC | PRN
  Administered 2013-07-30 – 2013-08-01 (×6): 1 mg via INTRAVENOUS
  Filled 2013-07-30 (×6): qty 1

## 2013-07-30 MED ORDER — IOHEXOL 300 MG/ML  SOLN
100.0000 mL | Freq: Once | INTRAMUSCULAR | Status: AC | PRN
  Administered 2013-07-30: 100 mL via INTRAVENOUS

## 2013-07-30 MED ORDER — ACETAMINOPHEN 160 MG/5ML PO SOLN
650.0000 mg | Freq: Three times a day (TID) | ORAL | Status: DC
  Administered 2013-07-30 – 2013-08-03 (×10): 650 mg
  Filled 2013-07-30 (×12): qty 20.3

## 2013-07-30 MED ORDER — INSULIN ASPART 100 UNIT/ML ~~LOC~~ SOLN
0.0000 [IU] | Freq: Three times a day (TID) | SUBCUTANEOUS | Status: DC
  Administered 2013-07-31: 08:00:00 8 [IU] via SUBCUTANEOUS
  Administered 2013-07-31: 13:00:00 5 [IU] via SUBCUTANEOUS

## 2013-07-30 MED ORDER — ZOLPIDEM TARTRATE 10 MG PO TABS
10.0000 mg | ORAL_TABLET | Freq: Every day | ORAL | Status: DC
  Administered 2013-07-30 – 2013-08-02 (×4): 10 mg
  Filled 2013-07-30 (×4): qty 1

## 2013-07-30 MED ORDER — METHADONE HCL 10 MG/ML PO CONC: 10.0000 mg | Freq: Two times a day (BID) | ORAL | Status: DC | PRN

## 2013-07-30 MED ORDER — INSULIN GLARGINE 100 UNIT/ML ~~LOC~~ SOLN
15.0000 [IU] | Freq: Every day | SUBCUTANEOUS | Status: DC
  Administered 2013-07-30 – 2013-08-02 (×4): 15 [IU] via SUBCUTANEOUS
  Filled 2013-07-30 (×5): qty 0.15

## 2013-07-30 MED ORDER — PROMETHAZINE HCL 25 MG/ML IJ SOLN
25.0000 mg | Freq: Once | INTRAMUSCULAR | Status: AC
  Administered 2013-07-30: 25 mg via INTRAVENOUS
  Filled 2013-07-30: qty 1

## 2013-07-30 MED ORDER — PROMETHAZINE HCL 25 MG/ML IJ SOLN
25.0000 mg | Freq: Once | INTRAMUSCULAR | Status: AC
  Administered 2013-07-30: 25 mg via INTRAMUSCULAR
  Filled 2013-07-30: qty 1

## 2013-07-30 MED ORDER — GABAPENTIN 300 MG PO CAPS
600.0000 mg | ORAL_CAPSULE | Freq: Three times a day (TID) | ORAL | Status: DC
  Administered 2013-07-30 – 2013-08-03 (×11): 600 mg
  Filled 2013-07-30 (×15): qty 2

## 2013-07-30 MED ORDER — TORSEMIDE 20 MG PO TABS
20.0000 mg | ORAL_TABLET | Freq: Every day | ORAL | Status: DC
  Administered 2013-07-30 – 2013-08-03 (×5): 20 mg
  Filled 2013-07-30 (×5): qty 1

## 2013-07-30 MED ORDER — METOCLOPRAMIDE HCL 5 MG/ML IJ SOLN
10.0000 mg | Freq: Three times a day (TID) | INTRAMUSCULAR | Status: DC
  Administered 2013-07-30 (×2): 10 mg via INTRAVENOUS
  Filled 2013-07-30 (×5): qty 2

## 2013-07-30 MED ORDER — HYDRALAZINE HCL 20 MG/ML IJ SOLN
5.0000 mg | Freq: Four times a day (QID) | INTRAMUSCULAR | Status: DC | PRN
  Filled 2013-07-30: qty 1

## 2013-07-30 NOTE — ED Notes (Signed)
Charge RN at bedside to attempt US guided IV

## 2013-07-30 NOTE — ED Provider Notes (Signed)
Medical screening examination/treatment/procedure(s) were performed by non-physician practitioner and as supervising physician I was immediately available for consultation/collaboration.  EKG Interpretation   None         Delice Bison Ward, DO 07/30/13 1625

## 2013-07-30 NOTE — ED Notes (Signed)
Patient transported to CT 

## 2013-07-30 NOTE — Progress Notes (Signed)
Patient arrived to floor.  Patient HR 130s, BP 195/109.  Patient has a g-tube located in left upper quadrant of abdomen.  Surrounding area is reddish pink with irritated skin.  Dr. Karleen Hampshire notified, is in with patient now.  Will continue to monitor.

## 2013-07-30 NOTE — ED Notes (Signed)
Per EMS. Pt from PCP. Pt complains of n/v/d since this am. Pt went to PCP then told PCP she wanted to go to hospital. Pt told ems she saw some blood in emesis. But no blood in emesis seen by EMS

## 2013-07-30 NOTE — ED Notes (Signed)
Bed: WA21 Expected date:  Expected time:  Means of arrival:  Comments: N/V 

## 2013-07-30 NOTE — ED Notes (Signed)
Pt also complains of upper abd pain

## 2013-07-30 NOTE — ED Provider Notes (Signed)
CSN: [id number]     Arrival date & time 07/30/13  1124 History   First MD Initiated Contact with Patient 07/30/13 1126     Chief Complaint  Patient presents with  . Emesis  . Diarrhea  . Abdominal Pain   (Consider location/radiation/quality/duration/timing/severity/associated sxs/prior Treatment) HPI Comments: Patient presents to the ED with a chief complaint of n/v/d and abdominal pain that started last night.  The pain has progressively worsened.  She states that she has a history of gastroparesis and currently has a feeding tube.  She states that she is having vomiting that is bloody and very painful.  She states the pain is 10/10.  Nothing makes the symptoms better or worse.  The history is provided by the patient. No language interpreter was used.    Past Medical History  Diagnosis Date  . Hypertension   . Diabetes mellitus   . Gastroparesis   . Asthma   . GERD (gastroesophageal reflux disease)   . Coronary artery disease   . h/o seizure   . Neuropathy   . Chronic pancreatitis   . Dyslipidemia   . MI (myocardial infarction)   . COPD (chronic obstructive pulmonary disease)   . Seizures    Past Surgical History  Procedure Laterality Date  . Abdominal hysterectomy    . Cholecystectomy    . Peg tube x 4      feeding jejunostomies with PEG tubes  . Cesarean section    . Colon resection due to diverticulitis    . Back surgery    . Esophagogastroduodenoscopy N/A 12/27/2012    Procedure: ESOPHAGOGASTRODUODENOSCOPY (EGD);  Surgeon: Missy Sabins, MD;  Location: [facility];  Service: Endoscopy;  Laterality: N/A;  . Esophagogastroduodenoscopy (egd) with esophageal dilation N/A 02/19/2013    Procedure: ESOPHAGOGASTRODUODENOSCOPY (EGD) WITH ESOPHAGEAL DILATION;  Surgeon: Wonda Horner, MD;  Location: [facility];  Service: Endoscopy;  Laterality: N/A;  . Balloon dilation N/A 02/19/2013    Procedure: BALLOON DILATION;  Surgeon: Wonda Horner, MD;  Location: [facility];  Service:  Endoscopy;  Laterality: N/A;   Family History  Problem Relation Age of Onset  . Cancer Mother    History  Substance Use Topics  . Smoking status: Former Smoker -- 0.25 packs/day for 2 years    Types: Cigarettes  . Smokeless tobacco: Never Used  . Alcohol Use: No     Comment: hx 1 beer daily -pt states she quit Dec. 2012   OB History   Grav Para Term Preterm Abortions TAB SAB Ect Mult Living                 Review of Systems  All other systems reviewed and are negative.    Allergies  Cefadroxil; Darvocet; Compazine; Nsaids; Penicillins; and Zofran  Home Medications   Current Outpatient Rx  Name  Route  Sig  Dispense  Refill  . acetaminophen (TYLENOL) 160 MG/5ML solution   Per Tube   Place 20.3 mLs (650 mg total) into feeding tube 3 (three) times daily.   120 mL   0   . amLODipine (NORVASC) 10 MG tablet   Per Tube   Place 1 tablet (10 mg total) into feeding tube daily.   30 tablet   2   . atorvastatin (LIPITOR) 40 MG tablet   Per Tube   Place 1 tablet (40 mg total) into feeding tube daily at 6 PM.   30 tablet   0   . busPIRone (BUSPAR) 5 MG tablet  Per Tube   Place 5 mg into feeding tube 3 (three) times daily.         Marland Kitchen enoxaparin (LOVENOX) 40 MG/0.4ML injection   Subcutaneous   Inject 0.4 mLs (40 mg total) into the skin daily.   1 Syringe   1   . Fluticasone-Salmeterol (ADVAIR) 250-50 MCG/DOSE AEPB   Inhalation   Inhale 1 puff into the lungs 2 (two) times daily.   60 each   0   . gabapentin (NEURONTIN) 600 MG tablet   Oral   Take 600 mg by mouth 3 (three) times daily.         Marland Kitchen HYDROmorphone (DILAUDID) 1 MG/ML SOLN injection   Intravenous   Inject 0.5-1 mLs (0.5-1 mg total) into the vein every 3 (three) hours as needed for severe pain.   10 mL   0   . insulin aspart (NOVOLOG) 100 UNIT/ML injection   Subcutaneous   Inject 0-9 Units into the skin every 4 (four) hours.   1 vial   12   . insulin glargine (LANTUS) 100 UNIT/ML injection    Subcutaneous   Inject 0.1 mLs (10 Units total) into the skin at bedtime.   10 mL   12   . lisinopril (PRINIVIL,ZESTRIL) 10 MG tablet      10 mg. 1 tablet (10 mg total) by Per J Tube route daily.         Marland Kitchen LORazepam (ATIVAN) 2 MG/ML injection   Intravenous   Inject 0.25 mLs (0.5 mg total) into the vein every 6 (six) hours as needed for anxiety (nausea).   1 mL   0   . metoCLOPramide (REGLAN) 5 MG/5ML solution   Oral   Take 10 mLs (10 mg total) by mouth 3 (three) times daily before meals.   120 mL   0   . Multiple Vitamin (MULTIVITAMIN) LIQD   Per Tube   Place 5 mLs into feeding tube daily.   60 mL   1   . Nutritional Supplements (FEEDING SUPPLEMENT, VITAL AF 1.2 CAL,) LIQD   Per Tube   Place 119-237 mLs into feeding tube every 4 (four) hours.   1500 mL   10   . Nutritional Supplements (FEEDING SUPPLEMENT, VITAL AF 1.2 CAL,) LIQD   Per Tube   Place 237 mLs into feeding tube every 3 (three) hours while awake.   1 Can   1   . pantoprazole sodium (PROTONIX) 40 mg/20 mL PACK   Per Tube   Place 40 mg into feeding tube daily.         . promethazine (PHENERGAN) 25 MG suppository   Rectal   Place 25 mg rectally every 6 (six) hours as needed for nausea.         . sodium chloride 0.9 % infusion   Intravenous   Inject 100 mLs into the vein continuous.   100 mL   0   . sucralfate (CARAFATE) 1 GM/10ML suspension   Per Tube   Place 10 mLs (1 g total) into feeding tube 4 (four) times daily -  with meals and at bedtime.   420 mL   0   . traMADol (ULTRAM) 50 MG tablet   Oral   Take 1-2 tablets (50-100 mg total) by mouth every 6 (six) hours as needed.   30 tablet   0   . Water For Irrigation, Sterile (FREE WATER) SOLN   Per Tube   Place 60 mLs into feeding tube every 4 (  four) hours.          BP 174/119  Pulse 130  Temp(Src) 98 F (36.7 C) (Oral)  Resp 20  SpO2 100% Physical Exam  Nursing note and vitals reviewed. Constitutional: She is oriented to  person, place, and time. She appears well-developed and well-nourished.  HENT:  Head: Normocephalic and atraumatic.  Eyes: Conjunctivae and EOM are normal. Pupils are equal, round, and reactive to light.  Neck: Normal range of motion. Neck supple.  Cardiovascular: Normal rate and regular rhythm.  Exam reveals no gallop and no friction rub.   No murmur heard. Pulmonary/Chest: Effort normal. No respiratory distress. She has no wheezes. She has no rales. She exhibits no tenderness.  Abdominal: Soft. She exhibits no distension and no mass. There is tenderness. There is no rebound and no guarding.  Feeding tube in place in left upper quadrant, abdomen is diffusely tender to palpation, but no fluid wave  Musculoskeletal: Normal range of motion. She exhibits no edema and no tenderness.  Neurological: She is alert and oriented to person, place, and time.  Skin: Skin is warm and dry.  Psychiatric: She has a normal mood and affect. Her behavior is normal. Judgment and thought content normal.    ED Course  Procedures (including critical care time) Results for orders placed during the hospital encounter of 07/30/13  CBC WITH DIFFERENTIAL      Result Value Range   WBC 19.5 (*) 4.0 - 10.5 K/uL   RBC 4.51  3.87 - 5.11 MIL/uL   Hemoglobin 13.8  12.0 - 15.0 g/dL   HCT 39.2  36.0 - 46.0 %   MCV 86.9  78.0 - 100.0 fL   MCH 30.6  26.0 - 34.0 pg   MCHC 35.2  30.0 - 36.0 g/dL   RDW 14.0  11.5 - 15.5 %   Platelets 341  150 - 400 K/uL   Neutrophils Relative % 85 (*) 43 - 77 %   Lymphocytes Relative 11 (*) 12 - 46 %   Monocytes Relative 4  3 - 12 %   Eosinophils Relative 0  0 - 5 %   Basophils Relative 0  0 - 1 %   Neutro Abs 16.6 (*) 1.7 - 7.7 K/uL   Lymphs Abs 2.1  0.7 - 4.0 K/uL   Monocytes Absolute 0.8  0.1 - 1.0 K/uL   Eosinophils Absolute 0.0  0.0 - 0.7 K/uL   Basophils Absolute 0.0  0.0 - 0.1 K/uL   WBC Morphology VACUOLATED NEUTROPHILS    COMPREHENSIVE METABOLIC PANEL      Result Value Range    Sodium 129 (*) 137 - 147 mEq/L   Potassium 4.0  3.7 - 5.3 mEq/L   Chloride 85 (*) 96 - 112 mEq/L   CO2 21  19 - 32 mEq/L   Glucose, Bld 342 (*) 70 - 99 mg/dL   BUN 21  6 - 23 mg/dL   Creatinine, Ser 0.76  0.50 - 1.10 mg/dL   Calcium 9.9  8.4 - 10.5 mg/dL   Total Protein 8.4 (*) 6.0 - 8.3 g/dL   Albumin 3.3 (*) 3.5 - 5.2 g/dL   AST 16  0 - 37 U/L   ALT 9  0 - 35 U/L   Alkaline Phosphatase 127 (*) 39 - 117 U/L   Total Bilirubin 0.3  0.3 - 1.2 mg/dL   GFR calc non Af Amer >90  >90 mL/min   GFR calc Af Amer >90  >90 mL/min  LIPASE,  BLOOD      Result Value Range   Lipase 14  11 - 59 U/L  OCCULT BLOOD GASTRIC / DUODENUM (SPECIMEN CUP)      Result Value Range   pH, Gastric 1     Occult Blood, Gastric POSITIVE (*) NEGATIVE  POCT PREGNANCY, URINE      Result Value Range   Preg Test, Ur NEGATIVE  NEGATIVE   Ct Abdomen Pelvis W Contrast  07/30/2013   CLINICAL DATA:  Abdominal pain, nausea, vomiting, diarrhea, history of hysterectomy, PEG tube, cholecystectomy, partial colon resection, diverticulitis  EXAM: CT ABDOMEN AND PELVIS WITH CONTRAST  TECHNIQUE: Multidetector CT imaging of the abdomen and pelvis was performed using the standard protocol following bolus administration of intravenous contrast.  CONTRAST:  142mL OMNIPAQUE IOHEXOL 300 MG/ML  SOLN  COMPARISON:  05/27/2013  FINDINGS: Visualized portions of lung bases clear.  Study is effectively performed without contrast, as there was IV infiltration of the injected dose. The patient's referring physician and nurse were notified. There is also no oral contrast.  Allowing for the absence of contrast, there is grossly normal liver, spleen, and pancreas. There is a small hiatal hernia. The patient is status post cholecystectomy. Hip common bile duct is mildly prominent. This is likely due to prior cholecystectomy. There is a percutaneous gastrostomy tube with tip in the stomach. Adrenal glands are normal. Kidneys are normal.  There is a  nonobstructive bowel gas pattern. There is left upper quadrant colon anastomosis. There is no free air or fluid in the abdomen or pelvis. There is no evidence of focal inflammatory process.  Pelvic organs appear to be absent. The bladder is distended. There are no acute musculoskeletal findings.  IMPRESSION: No acute abnormality. Study is effectively performed without contrast given intravenous infiltration of the contrast does as described above.   Electronically Signed   By: Skipper Cliche M.D.   On: 07/30/2013 14:11     EKG Interpretation   None       MDM   1. Gastroparesis   2. Nausea   3. Vomiting   4. Abdominal pain    Patient with history of gastroparesis and intractable nausea and vomiting. Will check basic labs, treat pain, nausea, and give fluids.  Labs are remarkable for mild dehydration. Patient still not tolerating oral intake, and is still in a lot of pain. CT is remarkable as above, study was limited due to contrast infiltration.  Compresses applied to arm for contrast infiltration.  Patient discussed with Dr. Leonides Schanz, who recommends admission for intractable nausea and vomiting, and pain.  Discussed the patient with the hospitalist team, who will admit the patient for symptom control.     Montine Circle, PA-C 07/30/13 [phone]

## 2013-07-30 NOTE — Progress Notes (Signed)
UR completed 

## 2013-07-30 NOTE — H&P (Signed)
[facility] History and Physical  LYNISE LAVANWAY [id number] DOB: 1970-10-11 DOA: 07/30/2013  Referring physician: EDP PCP: [name], MD   Chief Complaint: persistent nausea and vomiting since am.  HPI: [name] is a 42 y.o. female with prior h/o uncontrolled DM, diabetic gastroparesis, s/p PEG tube, hypertension, hospitalizations every month for the above symptoms, comes intoday for persistent nausea , vomiting over the last 12 hours. On arrival to ED, she was tachycardic, hypertensive and in mild distress from abdominal pain. She also reports having watery diarrhea since this am. She denies fever , has chills, no sob, chest pain, orthopnea, pnd, syncope or pedal edema. Her labs revealed hyponatremia, leukocytosis. CT abd and plevis was done without contrast did not reveal any intraabdominal pathology. She was referred to hospitalist for admission for her persistent nausea, vomiting and elevated BP,.    Review of Systems:  Constitutional:  No weight loss, night sweats, Fevers, chills, fatigue.  HEENT:  No headaches, Difficulty swallowing,Tooth/dental problems,Sore throat,  No sneezing, itching, ear ache, nasal congestion, post nasal drip,  Cardio-vascular:  No chest pain, Orthopnea, PND, swelling in lower extremities, anasarca, dizziness, palpitations  GI:  NAUSEA, vomiting and abdominal pain, diarrhea.  Resp:  No shortness of breath with exertion or at rest. No excess mucus, no productive cough, No non-productive cough, No coughing up of blood.No change in color of mucus.No wheezing.No chest wall deformity  Skin:  no rash or lesions.  GU:  no dysuria, change in color of urine, no urgency or frequency. No flank pain.  Musculoskeletal:  No joint pain or swelling. No decreased range of motion. No back pai.  Past Medical History  Diagnosis Date  . Hypertension   . Diabetes mellitus   . Gastroparesis   . Asthma   . GERD (gastroesophageal reflux disease)   . Coronary  artery disease   . h/o seizure   . Neuropathy   . Chronic pancreatitis   . Dyslipidemia   . MI (myocardial infarction)   . COPD (chronic obstructive pulmonary disease)   . Seizures    Past Surgical History  Procedure Laterality Date  . Abdominal hysterectomy    . Cholecystectomy    . Peg tube x 4      feeding jejunostomies with PEG tubes  . Cesarean section    . Colon resection due to diverticulitis    . Back surgery    . Esophagogastroduodenoscopy N/A 12/27/2012    Procedure: ESOPHAGOGASTRODUODENOSCOPY (EGD);  Surgeon: Missy Sabins, MD;  Location: [facility];  Service: Endoscopy;  Laterality: N/A;  . Esophagogastroduodenoscopy (egd) with esophageal dilation N/A 02/19/2013    Procedure: ESOPHAGOGASTRODUODENOSCOPY (EGD) WITH ESOPHAGEAL DILATION;  Surgeon: Wonda Horner, MD;  Location: [facility];  Service: Endoscopy;  Laterality: N/A;  . Balloon dilation N/A 02/19/2013    Procedure: BALLOON DILATION;  Surgeon: Wonda Horner, MD;  Location: [facility];  Service: Endoscopy;  Laterality: N/A;   Social History:  reports that she has quit smoking. Her smoking use included Cigarettes. She has a .5 pack-year smoking history. She has never used smokeless tobacco. She reports that she does not drink alcohol or use illicit drugs.  Allergies  Allergen Reactions  . Cefadroxil Other (See Comments)    Not sure of effects-listed as allergy on list from home  . Darvocet [Propoxyphene N-Acetaminophen] Other (See Comments)    UNKNOWN  . Compazine Hives    Tolerates promethazine  . Nsaids Hives  . Penicillins Hives  . Zofran [Ondansetron] Hives  Family History  Problem Relation Age of Onset  . Cancer Mother      Prior to Admission medications   Medication Sig Start Date End Date Taking? Authorizing Provider  acetaminophen (TYLENOL) 160 MG/5ML solution Place 20.3 mLs (650 mg total) into feeding tube 3 (three) times daily. 05/29/13  Yes Robbie Lis, MD  amLODipine (NORVASC) 10 MG  tablet Place 1 tablet (10 mg total) into feeding tube daily. 02/20/13  Yes Robbie Lis, MD  atorvastatin (LIPITOR) 40 MG tablet Place 1 tablet (40 mg total) into feeding tube daily at 6 PM. 02/20/13  Yes Robbie Lis, MD  busPIRone (BUSPAR) 5 MG tablet Place 5 mg into feeding tube 3 (three) times daily.   Yes Historical Provider, MD  cefpodoxime (VANTIN) 200 MG tablet Place 200 mg into feeding tube 2 (two) times daily. 07/23/13 08/01/13 Yes Historical Provider, MD  enoxaparin (LOVENOX) 40 MG/0.4ML injection Inject 0.4 mLs (40 mg total) into the skin daily. 05/29/13  Yes Robbie Lis, MD  Fluticasone-Salmeterol (ADVAIR) 250-50 MCG/DOSE AEPB Inhale 1 puff into the lungs 2 (two) times daily. 12/02/11  Yes Robbie Lis, MD  gabapentin (NEURONTIN) 600 MG tablet Place 600 mg into feeding tube 3 (three) times daily.    Yes Historical Provider, MD  HYDROmorphone (DILAUDID) 1 MG/ML SOLN injection Inject 0.5-1 mLs (0.5-1 mg total) into the vein every 3 (three) hours as needed for severe pain. 05/29/13  Yes Robbie Lis, MD  insulin aspart (NOVOLOG) 100 UNIT/ML injection Inject 0-9 Units into the skin every 4 (four) hours. 05/29/13  Yes Robbie Lis, MD  insulin glargine (LANTUS) 100 UNIT/ML injection Inject 15 Units into the skin at bedtime.   Yes Historical Provider, MD  lisinopril (PRINIVIL,ZESTRIL) 10 MG tablet Place 10 mg into feeding tube daily.  03/29/13  Yes Historical Provider, MD  LORazepam (ATIVAN) 2 MG/ML injection Inject 0.25 mLs (0.5 mg total) into the vein every 6 (six) hours as needed for anxiety (nausea). 05/29/13  Yes Robbie Lis, MD  magnesium oxide (MAG-OX) 400 MG tablet Place 400 mg into feeding tube daily.   Yes Historical Provider, MD  methadone (DOLOPHINE) 5 MG/5ML solution Place 10 mg into feeding tube 2 (two) times daily as needed for severe pain.  07/23/13 08/05/13 Yes Historical Provider, MD  metoCLOPramide (REGLAN) 5 MG/5ML solution Take 10 mLs (10 mg total) by mouth 3 (three) times  daily before meals. 05/29/13  Yes Robbie Lis, MD  mirtazapine (REMERON) 15 MG tablet Place 15 mg into feeding tube at bedtime.   Yes Historical Provider, MD  Multiple Vitamin (MULTIVITAMIN) LIQD Place 5 mLs into feeding tube daily. 05/07/13  Yes Hosie Poisson, MD  Nutritional Supplements (FEEDING SUPPLEMENT, VITAL AF 1.2 CAL,) LIQD Place 237 mLs into feeding tube every 3 (three) hours while awake. 05/29/13  Yes Robbie Lis, MD  pantoprazole sodium (PROTONIX) 40 mg/20 mL PACK Place 40 mg into feeding tube daily.   Yes Historical Provider, MD  promethazine (PHENERGAN) 25 MG suppository Place 25 mg rectally every 6 (six) hours as needed for nausea.   Yes Historical Provider, MD  sodium chloride 0.9 % infusion Inject 100 mLs into the vein continuous. 05/29/13  Yes Robbie Lis, MD  sucralfate (CARAFATE) 1 GM/10ML suspension Place 10 mLs (1 g total) into feeding tube 4 (four) times daily -  with meals and at bedtime. 05/13/13  Yes Hoy Morn, MD  torsemide (DEMADEX) 20 MG tablet Place 20 mg into  feeding tube daily.   Yes Historical Provider, MD  Water For Irrigation, Sterile (FREE WATER) SOLN Place 60 mLs into feeding tube every 4 (four) hours. 05/08/13  Yes Hosie Poisson, MD  zolpidem (AMBIEN) 10 MG tablet Place 10 mg into feeding tube at bedtime. 05/22/13  Yes Historical Provider, MD   Physical Exam: Filed Vitals:   07/30/13 1653  BP: 195/109  Pulse: 130  Temp: 99.1 F (37.3 C)  Resp: 18    BP 195/109  Pulse 130  Temp(Src) 99.1 F (37.3 C) (Oral)  Resp 18  Ht 5\' 4"  (1.626 m)  Wt 67.7 kg (149 lb 4 oz)  BMI 25.61 kg/m2  SpO2 100%  General:  Appears calm and in mild distress.  Eyes: PERRL, normal lids, irises & conjunctiva Neck: no LAD, masses or thyromegaly Cardiovascular: RRR, no m/r/g. No LE edema. Respiratory: CTA bilaterally, no w/r/r. Normal respiratory effort. Abdomen: soft, mild generalized tenderness, peg site slightly erythematous.  Skin: no rash or induration seen on  limited exam Musculoskeletal: grossly normal tone BUE/BLE Neurologic: grossly non-focal.          Labs on Admission:  Basic Metabolic Panel:  Recent Labs Lab 07/30/13 1234  NA 129*  K 4.0  CL 85*  CO2 21  GLUCOSE 342*  BUN 21  CREATININE 0.76  CALCIUM 9.9   Liver Function Tests:  Recent Labs Lab 07/30/13 1234  AST 16  ALT 9  ALKPHOS 127*  BILITOT 0.3  PROT 8.4*  ALBUMIN 3.3*    Recent Labs Lab 07/30/13 1234  LIPASE 14   No results found for this basename: AMMONIA,  in the last 168 hours CBC:  Recent Labs Lab 07/30/13 1234  WBC 19.5*  NEUTROABS 16.6*  HGB 13.8  HCT 39.2  MCV 86.9  PLT 341   Cardiac Enzymes: No results found for this basename: CKTOTAL, CKMB, CKMBINDEX, TROPONINI,  in the last 168 hours  BNP (last 3 results) No results found for this basename: PROBNP,  in the last 8760 hours CBG: No results found for this basename: GLUCAP,  in the last 168 hours  Radiological Exams on Admission: Ct Abdomen Pelvis W Contrast  07/30/2013   CLINICAL DATA:  Abdominal pain, nausea, vomiting, diarrhea, history of hysterectomy, PEG tube, cholecystectomy, partial colon resection, diverticulitis  EXAM: CT ABDOMEN AND PELVIS WITH CONTRAST  TECHNIQUE: Multidetector CT imaging of the abdomen and pelvis was performed using the standard protocol following bolus administration of intravenous contrast.  CONTRAST:  153mL OMNIPAQUE IOHEXOL 300 MG/ML  SOLN  COMPARISON:  05/27/2013  FINDINGS: Visualized portions of lung bases clear.  Study is effectively performed without contrast, as there was IV infiltration of the injected dose. The patient's referring physician and nurse were notified. There is also no oral contrast.  Allowing for the absence of contrast, there is grossly normal liver, spleen, and pancreas. There is a small hiatal hernia. The patient is status post cholecystectomy. Hip common bile duct is mildly prominent. This is likely due to prior cholecystectomy. There  is a percutaneous gastrostomy tube with tip in the stomach. Adrenal glands are normal. Kidneys are normal.  There is a nonobstructive bowel gas pattern. There is left upper quadrant colon anastomosis. There is no free air or fluid in the abdomen or pelvis. There is no evidence of focal inflammatory process.  Pelvic organs appear to be absent. The bladder is distended. There are no acute musculoskeletal findings.  IMPRESSION: No acute abnormality. Study is effectively performed without contrast given intravenous  infiltration of the contrast does as described above.   Electronically Signed   By: Skipper Cliche M.D.   On: 07/30/2013 14:11    EKG: sinus tachy at 125/min  Assessment/Plan Active Problems:   Gastroparesis   Intractable nausea and vomiting   GERD (gastroesophageal reflux disease)   DM (diabetes mellitus), type 2, uncontrolled   Dehydration   Leukocytosis   Nausea & vomiting  Persistent Nausea and vomiting, diarrhea.  could be secondary to gastroparesis, vs viral gastroenteritis.  Symptomatic management with IV fluids, iv anti emetics,  IV reglan, pain control.  NPO  Nutrition consult for initiation of tube feeds at a slow rate.  At home she is on osmolite 1.5 at 40ml/hr. We will have to start with 98ml/hr.  CT abd and plevis did not show any abd pathology.  c diff pcr to evaluate diarrhea for infection, as she was on antibiotics recently.    Hyponatremia: Possibly from dehydration.  Hydrate and repeat in am.   Leukocytosis Probably reactive, UA negative. CXR ordered to evaluate for aspiration. Will also order procalcitonin levels.   Hypertensive urgency: Probably from the persistent nausea and vomiting and not being able to keep her medications down.  IV hydralazine ordered prn for BP>180/140mmhg.  IV Labetolol also ordered.   Diabetes Mellitus:  Uncontrolled.  hgba1c ordered.  SSI. Marland Kitchen Lantus.    Severe protein energy malnutrition - nutrition consulted.   DVT  prophylaxis.     Code Status: full code presumed Family Communication: none atbedside Disposition Plan: pending.   Time spent: 75 min  [facility] Pager [phone]

## 2013-07-30 NOTE — ED Notes (Signed)
Gave pt ginger ale.  

## 2013-07-31 ENCOUNTER — Inpatient Hospital Stay (HOSPITAL_COMMUNITY): Payer: Medicare Other

## 2013-07-31 DIAGNOSIS — R7309 Other abnormal glucose: Secondary | ICD-10-CM

## 2013-07-31 DIAGNOSIS — E785 Hyperlipidemia, unspecified: Secondary | ICD-10-CM

## 2013-07-31 DIAGNOSIS — R109 Unspecified abdominal pain: Secondary | ICD-10-CM

## 2013-07-31 DIAGNOSIS — R112 Nausea with vomiting, unspecified: Secondary | ICD-10-CM

## 2013-07-31 DIAGNOSIS — D72829 Elevated white blood cell count, unspecified: Secondary | ICD-10-CM

## 2013-07-31 DIAGNOSIS — R1013 Epigastric pain: Secondary | ICD-10-CM

## 2013-07-31 DIAGNOSIS — E871 Hypo-osmolality and hyponatremia: Secondary | ICD-10-CM

## 2013-07-31 DIAGNOSIS — R1115 Cyclical vomiting syndrome unrelated to migraine: Secondary | ICD-10-CM

## 2013-07-31 LAB — BASIC METABOLIC PANEL
BUN: 31 mg/dL — ABNORMAL HIGH (ref 6–23)
CO2: 21 mEq/L (ref 19–32)
Calcium: 9 mg/dL (ref 8.4–10.5)
Creatinine, Ser: 0.98 mg/dL (ref 0.50–1.10)
Glucose, Bld: 336 mg/dL — ABNORMAL HIGH (ref 70–99)
Potassium: 3.7 mEq/L (ref 3.7–5.3)

## 2013-07-31 LAB — HEPATIC FUNCTION PANEL
ALT: 8 U/L (ref 0–35)
AST: 18 U/L (ref 0–37)
Alkaline Phosphatase: 93 U/L (ref 39–117)
Bilirubin, Direct: <0.2 mg/dL (ref 0.0–0.3)
Total Bilirubin: <0.2 mg/dL — ABNORMAL LOW (ref 0.3–1.2)
Total Protein: 6.8 g/dL (ref 6.0–8.3)

## 2013-07-31 LAB — CBC WITH DIFFERENTIAL/PLATELET
Basophils Absolute: 0 10*3/uL (ref 0.0–0.1)
Basophils Relative: 0 % (ref 0–1)
Eosinophils Absolute: 0 10*3/uL (ref 0.0–0.7)
Hemoglobin: 12.7 g/dL (ref 12.0–15.0)
Lymphocytes Relative: 13 % (ref 12–46)
MCH: 30.7 pg (ref 26.0–34.0)
MCHC: 35.9 g/dL (ref 30.0–36.0)
Monocytes Absolute: 0.8 10*3/uL (ref 0.1–1.0)
Monocytes Relative: 5 % (ref 3–12)
Neutrophils Relative %: 82 % — ABNORMAL HIGH (ref 43–77)
Platelets: ADEQUATE 10*3/uL (ref 150–400)
RDW: 14.2 % (ref 11.5–15.5)

## 2013-07-31 LAB — GLUCOSE, CAPILLARY
Glucose-Capillary: 271 mg/dL — ABNORMAL HIGH (ref 70–99)
Glucose-Capillary: 110 mg/dL — ABNORMAL HIGH (ref 70–99)
Glucose-Capillary: 105 mg/dL — ABNORMAL HIGH (ref 70–99)

## 2013-07-31 LAB — MAGNESIUM: Magnesium: 1.6 mg/dL (ref 1.5–2.5)

## 2013-07-31 MED ORDER — VITAL AF 1.2 CAL PO LIQD
1000.0000 mL | ORAL | Status: DC
  Filled 2013-07-31 (×7): qty 1000

## 2013-07-31 MED ORDER — LABETALOL HCL 100 MG PO TABS
100.0000 mg | ORAL_TABLET | Freq: Two times a day (BID) | ORAL | Status: DC
  Administered 2013-08-01 – 2013-08-03 (×5): 100 mg
  Filled 2013-07-31 (×8): qty 1

## 2013-07-31 MED ORDER — PROMETHAZINE HCL 12.5 MG PO TABS
12.5000 mg | ORAL_TABLET | Freq: Once | ORAL | Status: AC
  Administered 2013-07-31: 12.5 mg
  Filled 2013-07-31: qty 1

## 2013-07-31 MED ORDER — PROMETHAZINE HCL 25 MG/ML IJ SOLN
25.0000 mg | INTRAMUSCULAR | Status: DC | PRN
  Administered 2013-08-01 – 2013-08-03 (×12): 25 mg via INTRAVENOUS
  Filled 2013-07-31 (×12): qty 1

## 2013-07-31 MED ORDER — VITAL AF 1.2 CAL PO LIQD: 1000.0000 mL | ORAL | Status: DC

## 2013-07-31 MED ORDER — HYDROMORPHONE HCL PF 1 MG/ML IJ SOLN
1.0000 mg | Freq: Once | INTRAMUSCULAR | Status: AC
  Administered 2013-07-31: 05:00:00 1 mg via INTRAMUSCULAR
  Filled 2013-07-31: qty 1

## 2013-07-31 MED ORDER — METOCLOPRAMIDE HCL 5 MG/ML IJ SOLN
10.0000 mg | Freq: Three times a day (TID) | INTRAMUSCULAR | Status: DC
  Administered 2013-07-31 – 2013-08-01 (×2): 10 mg via INTRAVENOUS
  Filled 2013-07-31 (×3): qty 2

## 2013-07-31 MED ORDER — HYDROMORPHONE HCL 2 MG PO TABS
1.0000 mg | ORAL_TABLET | Freq: Once | ORAL | Status: AC
  Administered 2013-07-31: 13:00:00 1 mg
  Filled 2013-07-31: qty 1

## 2013-07-31 MED ORDER — JEVITY 1.2 CAL PO LIQD: 1000.0000 mL | ORAL | Status: DC

## 2013-07-31 MED ORDER — PROMETHAZINE HCL 25 MG/ML IJ SOLN
12.5000 mg | Freq: Once | INTRAMUSCULAR | Status: AC
  Administered 2013-07-31: 12.5 mg via INTRAMUSCULAR
  Filled 2013-07-31: qty 1

## 2013-07-31 NOTE — Progress Notes (Signed)
Patient refused evening medications stating her 'stomach is too full and cannot take anything right now'.  Spoke with Dr. Sherral Hammers and will refrain from starting tube feeding until patient's nausea is better controlled now that patient has IV access.  Patient is asleep and stable.

## 2013-07-31 NOTE — Progress Notes (Signed)
INITIAL NUTRITION ASSESSMENT  DOCUMENTATION CODES Per approved criteria  -Not Applicable   INTERVENTION: Initiate Vital AF 1.2 @ 10 ml/hr via PEG and increase by 10 ml every 8 hours to goal rate of 60 ml/hr.  At goal rate, tube feeding regimen will provide 1728 kcal, 108 grams of protein, and 1168 ml of H2O.    NUTRITION DIAGNOSIS: Inadequate oral intake related to inability to eat as evidenced by NPO status.   Goal: Pt to meet >/= 90% of their estimated nutrition needs   Monitor:  TF tolerance, GI profile total protein/energy intake, Labs, Weights  Reason for Assessment: Enteral nutrition Consult  42 y.o. female  Admitting Dx: <principal problem not specified>  ASSESSMENT: [name] is a 42 y.o. female with prior h/o uncontrolled DM, diabetic gastroparesis, s/p PEG tube, hypertension, hospitalizations every month for the above symptoms, comes intoday for persistent nausea , vomiting over the last 12 hours. On arrival to ED, she was tachycardic, hypertensive and in mild distress from abdominal pain. She also reports having watery diarrhea since this am. She denies fever , has chills, no sob, chest pain, orthopnea, pnd, syncope or pedal edema. Her labs revealed hyponatremia, leukocytosis. CT abd and plevis was done without contrast did not reveal any intraabdominal pathology. She was referred to hospitalist for admission for her persistent nausea, vomiting and elevated BP,.   -Pt reported home nocturnal tube feeding regimen of Osmolite 1.5 at 45 ml/hr for 14 hours. This provided approximately 945 kcal, 41 gram protein -Has lost approximately 7 lbs in past month -Hyponatremic -Had been recommended to receive Vital AF 1.2 at 6 cans/daily per previous RD assessment in 10.2014. Per discussion with RN, pt only able to tolerate half can bolus of Vital AF 1.2 yesterday d/t feelings of satiety -Will recommend to continue with Vital AF 1.2 to assist with GI tolerance; however, will  transition to continuous as pt may benefit from continuous vs bolus  Height: Ht Readings from Last 1 Encounters:  07/30/13 5\' 4"  (1.626 m)    Weight: Wt Readings from Last 1 Encounters:  07/30/13 149 lb 4 oz (67.7 kg)    Ideal Body Weight: 120 lbs  % Ideal Body Weight: 124%  Wt Readings from Last 10 Encounters:  07/30/13 149 lb 4 oz (67.7 kg)  05/27/13 141 lb 12.1 oz (64.3 kg)  05/07/13 142 lb 13.7 oz (64.8 kg)  04/11/13 135 lb 2.3 oz (61.3 kg)  02/17/13 139 lb 1.8 oz (63.1 kg)  02/17/13 139 lb 1.8 oz (63.1 kg)  02/06/13 141 lb 1.5 oz (64 kg)  01/19/13 145 lb 4.5 oz (65.9 kg)  12/26/12 152 lb (68.947 kg)  12/26/12 152 lb (68.947 kg)    Usual Body Weight: 157 lbs  % Usual Body Weight: 95%  BMI:  Body mass index is 25.61 kg/(m^2).  Estimated Nutritional Needs: Kcal: 1600-1800 Protein: 80-95 gram Fluid: 2100 ml//daily  Skin: Red, pink irritated around PEG site  Diet Order: NPO  EDUCATION NEEDS: -No education needs identified at this time   Intake/Output Summary (Last 24 hours) at 07/31/13 0931 Last data filed at 07/31/13 0400  Gross per 24 hour  Intake    830 ml  Output      0 ml  Net    830 ml    Last BM: 12/29  Labs:   Recent Labs Lab 07/30/13 1234 07/31/13 0425  NA 129* 128*  K 4.0 3.7  CL 85* 89*  CO2 21 21  BUN 21  31*  CREATININE 0.76 0.98  CALCIUM 9.9 9.0  GLUCOSE 342* 336*    CBG (last 3)   Recent Labs  07/30/13 2217 07/31/13 0748  GLUCAP 410* 271*    Scheduled Meds: . acetaminophen  650 mg Per Tube TID  . amLODipine  10 mg Per Tube Daily  . busPIRone  5 mg Per Tube TID  . enoxaparin  40 mg Subcutaneous Q24H  . feeding supplement (VITAL AF 1.2 CAL)  237 mL Per Tube Q3H while awake  . free water  60 mL Per Tube Q4H  . gabapentin  600 mg Per Tube TID  . insulin aspart  0-15 Units Subcutaneous TID WC  . insulin glargine  15 Units Subcutaneous QHS  . lisinopril  10 mg Per Tube Daily  . metoCLOPramide (REGLAN) injection  10  mg Intravenous Q8H  . mirtazapine  15 mg Per Tube QHS  . mometasone-formoterol  2 puff Inhalation BID  . multivitamin  5 mL Per Tube Daily  . sucralfate  1 g Per Tube TID WC & HS  . torsemide  20 mg Per Tube Daily  . zolpidem  10 mg Per Tube QHS    Continuous Infusions: . sodium chloride Stopped (07/31/13 0015)    Past Medical History  Diagnosis Date  . Hypertension   . Diabetes mellitus   . Gastroparesis   . Asthma   . GERD (gastroesophageal reflux disease)   . Coronary artery disease   . h/o seizure   . Neuropathy   . Chronic pancreatitis   . Dyslipidemia   . MI (myocardial infarction)   . COPD (chronic obstructive pulmonary disease)   . Seizures     Past Surgical History  Procedure Laterality Date  . Abdominal hysterectomy    . Cholecystectomy    . Peg tube x 4      feeding jejunostomies with PEG tubes  . Cesarean section    . Colon resection due to diverticulitis    . Back surgery    . Esophagogastroduodenoscopy N/A 12/27/2012    Procedure: ESOPHAGOGASTRODUODENOSCOPY (EGD);  Surgeon: Missy Sabins, MD;  Location: [facility];  Service: Endoscopy;  Laterality: N/A;  . Esophagogastroduodenoscopy (egd) with esophageal dilation N/A 02/19/2013    Procedure: ESOPHAGOGASTRODUODENOSCOPY (EGD) WITH ESOPHAGEAL DILATION;  Surgeon: Wonda Horner, MD;  Location: [facility];  Service: Endoscopy;  Laterality: N/A;  . Balloon dilation N/A 02/19/2013    Procedure: BALLOON DILATION;  Surgeon: Wonda Horner, MD;  Location: [facility];  Service: Endoscopy;  Laterality: N/A;    Atlee Abide MS RD LDN Clinical Dietitian [id number]

## 2013-07-31 NOTE — Procedures (Signed)
Placement of right arm PICC.  Tip in lower SVC and ready to use.  No immediate complication.

## 2013-07-31 NOTE — Progress Notes (Signed)
Patient had a CT abdomen and pelvis 12/30 with an infiltrated 80 ml of Omnipaque 300 to her right arm IV, the IV was removed and the patient was evaluated in the ED. She has since been admitted. Upon interview today she denies any swelling, pain or redness at previous right arm IV site. She denies any numbness in her right hand and states her arm feels better. She was instructed to make her RN aware if she started to notice any increased pain, swelling, redness, or numbness of her right arm. She states understanding.   Tsosie Billing PA-C Interventional Radiology  07/31/13  10:51 AM

## 2013-07-31 NOTE — Progress Notes (Signed)
Inpatient Diabetes Program Recommendations  AACE/ADA: New Consensus Statement on Inpatient Glycemic Control (2013)  Target Ranges:  Prepandial:   less than 140 mg/dL      Peak postprandial:   less than 180 mg/dL (1-2 hours)      Critically ill patients:  140 - 180 mg/dL     Results for GARNITA, PESHLAKAI (MRN [id number]) as of 07/31/2013 15:06  Ref. Range 07/31/2013 07:48 07/31/2013 11:34  Glucose-Capillary Latest Range: 70-99 mg/dL 271 (H) 248 (H)    **MD- Please change Novolog Moderate SSI to Q4 hour coverage (currently ordered as tid and patient will eventually be receiving tube feeds)   Will follow. Wyn Quaker RN, MSN, CDE Diabetes Coordinator Inpatient Diabetes Program Team Pager: [phone] (8a-10p)

## 2013-07-31 NOTE — Progress Notes (Signed)
PT Cancellation Note  Patient Details Name: [name] MRN: [id number] DOB: 02/24/1971   Cancelled Treatment:    Reason Eval/Treat Not Completed: Pain limiting ability to participate--pt declined to participate at this time. Requested PT check back on tomorrow.    Weston Anna, MPT Pager: [phone]

## 2013-07-31 NOTE — Progress Notes (Addendum)
[facility] PROGRESS NOTE  [name] [id number] DOB: 1970/10/07 DOA: 07/30/2013 PCP: [name], MD  Assessment/Plan:  Diabetes type 2 uncontrolled -07/30/2013 HgA1C= 6.7 her indices within ADA guidelines) -Continue SSI for control of CBG while hospitalized   Diabetic gastroparesis -Patient now has a PICC line placed will initiate IV Reglan and Phenergan -Hold PEG tube feedings until nausea and vomiting better controlled -Continue to hydrate aggressively -After N./V. better control will with patient about possibly decreasing some of her pain medication which can exacerbate her condition  Intractable nausea and vomiting -see diabetic gastroparesis  HLD -Obtain lipid panel  HTN -Currently within ADA guidelines continue current medication  Hyponatremia -Should correct with hydration  Leukocytosis -Most likely secondary to stress, should correct with resolution of N./V. and hydration   Code Status: Full Family Communication: No family members available Disposition Plan: Resolution of nausea and vomiting   Consultants:    Procedures: Placement PICC right arm 07/31/2013   Antibiotics:  HPI/Subjective: 42 y.o. BF PMHx uncontrolled DM, diabetic gastroparesis, s/p PEG tube, hypertension, hospitalizations every month for the above symptoms, comes intoday for persistent nausea , vomiting over the last 12 hours. On arrival to ED, she was tachycardic, hypertensive and in mild distress from abdominal pain. She also reports having watery diarrhea since this am. She denies fever , has chills, no sob, chest pain, orthopnea, pnd, syncope or pedal edema. Her labs revealed hyponatremia, leukocytosis. CT abd and plevis was done without contrast did not reveal any intraabdominal pathology. 07/31/2013 after unsuccessful attempt to place central venous line at bedside, IR was again contacted and agreed to squeeze patient to schedule in order to place a multilumen PICC line.  Patient complains of abdominal pain, nausea not relieved with medication via PEG tube.    Objective: Filed Vitals:   07/30/13 2218 07/31/13 0703 07/31/13 0908 07/31/13 1347  BP: 157/86 129/79  110/62  Pulse: 120 109  98  Temp: 98.7 F (37.1 C) 99.3 F (37.4 C)  99.3 F (37.4 C)  TempSrc: Oral Oral  Oral  Resp: 20 20  20   Height:      Weight:      SpO2: 100% 100% 100% 100%    Intake/Output Summary (Last 24 hours) at 07/31/13 1455 Last data filed at 07/31/13 0400  Gross per 24 hour  Intake    830 ml  Output      0 ml  Net    830 ml   Filed Weights   07/30/13 1653  Weight: 67.7 kg (149 lb 4 oz)    Exam:   General:  A./O. x4, moderate distress secondary to diabetic gastroparesis  Cardiovascular: Regular in rate, negative murmurs rubs or gallops, DP/PT pulse one plus bilateral  Respiratory: Clear to auscultation bilateral  Abdomen: Diffuse abdominal pain to palpation, PEG tube present negative sign of infection  Musculoskeletal: Negative pedal edema   Data Reviewed: Basic Metabolic Panel:  Recent Labs Lab 07/30/13 1234 07/31/13 0425 07/31/13 0900  NA 129* 128*  --   K 4.0 3.7  --   CL 85* 89*  --   CO2 21 21  --   GLUCOSE 342* 336*  --   BUN 21 31*  --   CREATININE 0.76 0.98  --   CALCIUM 9.9 9.0  --   MG  --   --  1.6   Liver Function Tests:  Recent Labs Lab 07/30/13 1234 07/31/13 0900  AST 16 18  ALT 9 8  ALKPHOS 127* 93  BILITOT 0.3 <0.2*  PROT 8.4* 6.8  ALBUMIN 3.3* 2.8*    Recent Labs Lab 07/30/13 1234  LIPASE 14   No results found for this basename: AMMONIA,  in the last 168 hours CBC:  Recent Labs Lab 07/30/13 1234 07/31/13 0900  WBC 19.5* 16.4*  NEUTROABS 16.6* 13.5*  HGB 13.8 12.7  HCT 39.2 35.4*  MCV 86.9 85.5  PLT 341 PLATELET CLUMPS NOTED ON SMEAR, COUNT APPEARS ADEQUATE   Cardiac Enzymes: No results found for this basename: CKTOTAL, CKMB, CKMBINDEX, TROPONINI,  in the last 168 hours BNP (last 3 results) No  results found for this basename: PROBNP,  in the last 8760 hours CBG:  Recent Labs Lab 07/30/13 2217 07/31/13 0748 07/31/13 1134  GLUCAP 410* 271* 248*    No results found for this or any previous visit (from the past 240 hour(s)).   Studies: Ct Abdomen Pelvis W Contrast  07/30/2013   CLINICAL DATA:  Abdominal pain, nausea, vomiting, diarrhea, history of hysterectomy, PEG tube, cholecystectomy, partial colon resection, diverticulitis  EXAM: CT ABDOMEN AND PELVIS WITH CONTRAST  TECHNIQUE: Multidetector CT imaging of the abdomen and pelvis was performed using the standard protocol following bolus administration of intravenous contrast.  CONTRAST:  152mL OMNIPAQUE IOHEXOL 300 MG/ML  SOLN  COMPARISON:  05/27/2013  FINDINGS: Visualized portions of lung bases clear.  Study is effectively performed without contrast, as there was IV infiltration of the injected dose. The patient's referring physician and nurse were notified. There is also no oral contrast.  Allowing for the absence of contrast, there is grossly normal liver, spleen, and pancreas. There is a small hiatal hernia. The patient is status post cholecystectomy. Hip common bile duct is mildly prominent. This is likely due to prior cholecystectomy. There is a percutaneous gastrostomy tube with tip in the stomach. Adrenal glands are normal. Kidneys are normal.  There is a nonobstructive bowel gas pattern. There is left upper quadrant colon anastomosis. There is no free air or fluid in the abdomen or pelvis. There is no evidence of focal inflammatory process.  Pelvic organs appear to be absent. The bladder is distended. There are no acute musculoskeletal findings.  IMPRESSION: No acute abnormality. Study is effectively performed without contrast given intravenous infiltration of the contrast does as described above.   Electronically Signed   By: Skipper Cliche M.D.   On: 07/30/2013 14:11   Dg Chest Port 1 View  07/30/2013   CLINICAL DATA:   Vomiting, evaluate for aspiration  EXAM: PORTABLE CHEST - 1 VIEW  COMPARISON:  05/27/2013  FINDINGS: The heart size and mediastinal contours are within normal limits. Both lungs are clear. The visualized skeletal structures are unremarkable.  IMPRESSION: No active disease.   Electronically Signed   By: Skipper Cliche M.D.   On: 07/30/2013 18:19    Scheduled Meds: . acetaminophen  650 mg Per Tube TID  . amLODipine  10 mg Per Tube Daily  . busPIRone  5 mg Per Tube TID  . enoxaparin  40 mg Subcutaneous Q24H  . free water  60 mL Per Tube Q4H  . gabapentin  600 mg Per Tube TID  . insulin aspart  0-15 Units Subcutaneous TID WC  . insulin glargine  15 Units Subcutaneous QHS  . lisinopril  10 mg Per Tube Daily  . metoCLOPramide (REGLAN) injection  10 mg Intravenous Q8H  . mirtazapine  15 mg Per Tube QHS  . mometasone-formoterol  2 puff Inhalation BID  . multivitamin  5 mL  Per Tube Daily  . sucralfate  1 g Per Tube TID WC & HS  . torsemide  20 mg Per Tube Daily  . zolpidem  10 mg Per Tube QHS   Continuous Infusions: . sodium chloride Stopped (07/31/13 0015)  . feeding supplement (VITAL AF 1.2 CAL)      Active Problems:   Gastroparesis   Intractable nausea and vomiting   GERD (gastroesophageal reflux disease)   DM (diabetes mellitus), type 2, uncontrolled   Dehydration   Leukocytosis   Nausea & vomiting    Time spent: 90 minutes    WOODS, CURTIS, J  [facility] Pager [phone]. If 7PM-7AM, please contact night-coverage at [email or web address], password [facility] 07/31/2013, 2:55 PM  LOS: 1 day

## 2013-07-31 NOTE — Progress Notes (Signed)
Picked patient up from previous RN at 11 pm.  Patient requested nausea medication but IV was out upon inspection.  Notified IV team who was unable to gain access.  IV nurse recommends PICC line per IR.  Notified NP on call.  No new orders at this time.  Will continue to monitor patient.

## 2013-07-31 NOTE — Progress Notes (Signed)
Patient had a large amount of dark brown emesis.  Consistently complaining of nausea.  Spoke with dietician and will hold feeding until IV has been placed and emesis is controlled.

## 2013-08-01 ENCOUNTER — Inpatient Hospital Stay (HOSPITAL_COMMUNITY): Payer: Medicare Other

## 2013-08-01 DIAGNOSIS — F19939 Other psychoactive substance use, unspecified with withdrawal, unspecified: Secondary | ICD-10-CM

## 2013-08-01 DIAGNOSIS — F1193 Opioid use, unspecified with withdrawal: Secondary | ICD-10-CM | POA: Diagnosis present

## 2013-08-01 DIAGNOSIS — F1120 Opioid dependence, uncomplicated: Secondary | ICD-10-CM

## 2013-08-01 DIAGNOSIS — E876 Hypokalemia: Secondary | ICD-10-CM

## 2013-08-01 DIAGNOSIS — F1123 Opioid dependence with withdrawal: Secondary | ICD-10-CM | POA: Diagnosis present

## 2013-08-01 LAB — LIPASE, BLOOD: LIPASE: 13 U/L (ref 11–59)

## 2013-08-01 LAB — GLUCOSE, CAPILLARY
GLUCOSE-CAPILLARY: 136 mg/dL — AB (ref 70–99)
Glucose-Capillary: 65 mg/dL — ABNORMAL LOW (ref 70–99)
Glucose-Capillary: 123 mg/dL — ABNORMAL HIGH (ref 70–99)
Glucose-Capillary: 184 mg/dL — ABNORMAL HIGH (ref 70–99)
Glucose-Capillary: 101 mg/dL — ABNORMAL HIGH (ref 70–99)

## 2013-08-01 LAB — LIPID PANEL
Cholesterol: 179 mg/dL (ref 0–200)
HDL: 29 mg/dL — ABNORMAL LOW (ref >39)
LDL Cholesterol: 103 mg/dL — ABNORMAL HIGH (ref 0–99)
Total CHOL/HDL Ratio: 6.2 RATIO
Triglycerides: 236 mg/dL — ABNORMAL HIGH (ref <150)
VLDL: 47 mg/dL — ABNORMAL HIGH (ref 0–40)

## 2013-08-01 LAB — COMPREHENSIVE METABOLIC PANEL
ALT: 7 U/L (ref 0–35)
AST: 15 U/L (ref 0–37)
Albumin: 2.6 g/dL — ABNORMAL LOW (ref 3.5–5.2)
Alkaline Phosphatase: 76 U/L (ref 39–117)
BUN: 34 mg/dL — ABNORMAL HIGH (ref 6–23)
CO2: 25 mEq/L (ref 19–32)
Calcium: 8.5 mg/dL (ref 8.4–10.5)
Chloride: 99 mEq/L (ref 96–112)
Creatinine, Ser: 1.3 mg/dL — ABNORMAL HIGH (ref 0.50–1.10)
GFR calc Af Amer: 57 mL/min — ABNORMAL LOW (ref >90)
GFR calc non Af Amer: 49 mL/min — ABNORMAL LOW (ref >90)
Glucose, Bld: 76 mg/dL (ref 70–99)
Potassium: 3 mEq/L — ABNORMAL LOW (ref 3.7–5.3)
Sodium: 135 mEq/L — ABNORMAL LOW (ref 137–147)
Total Bilirubin: <0.2 mg/dL — ABNORMAL LOW (ref 0.3–1.2)
Total Protein: 6.1 g/dL (ref 6.0–8.3)

## 2013-08-01 LAB — CBC WITH DIFFERENTIAL/PLATELET
Basophils Absolute: 0 10*3/uL (ref 0.0–0.1)
Basophils Relative: 0 % (ref 0–1)
Eosinophils Absolute: 0 10*3/uL (ref 0.0–0.7)
Eosinophils Relative: 0 % (ref 0–5)
HCT: 32.2 % — ABNORMAL LOW (ref 36.0–46.0)
Hemoglobin: 10.7 g/dL — ABNORMAL LOW (ref 12.0–15.0)
Lymphocytes Relative: 29 % (ref 12–46)
Lymphs Abs: 3.1 10*3/uL (ref 0.7–4.0)
MCH: 29.4 pg (ref 26.0–34.0)
MCHC: 33.2 g/dL (ref 30.0–36.0)
MCV: 88.5 fL (ref 78.0–100.0)
Monocytes Absolute: 0.8 10*3/uL (ref 0.1–1.0)
Monocytes Relative: 8 % (ref 3–12)
Neutro Abs: 7 10*3/uL (ref 1.7–7.7)
Neutrophils Relative %: 64 % (ref 43–77)
Platelets: 227 10*3/uL (ref 150–400)
RBC: 3.64 MIL/uL — ABNORMAL LOW (ref 3.87–5.11)
RDW: 14.3 % (ref 11.5–15.5)
WBC: 11 10*3/uL — ABNORMAL HIGH (ref 4.0–10.5)

## 2013-08-01 LAB — LACTIC ACID, PLASMA: LACTIC ACID, VENOUS: 0.9 mmol/L (ref 0.5–2.2)

## 2013-08-01 LAB — AMYLASE: Amylase: 27 U/L (ref 0–105)

## 2013-08-01 LAB — CK TOTAL AND CKMB (NOT AT ARMC)
CK, MB: 1 ng/mL (ref 0.3–4.0)
Relative Index: 0.7 (ref 0.0–2.5)
Total CK: 139 U/L (ref 7–177)

## 2013-08-01 LAB — MAGNESIUM: Magnesium: 1.6 mg/dL (ref 1.5–2.5)

## 2013-08-01 LAB — LACTATE DEHYDROGENASE: LDH: 163 U/L (ref 94–250)

## 2013-08-01 LAB — SEDIMENTATION RATE: Sed Rate: 72 mm/hr — ABNORMAL HIGH (ref 0–22)

## 2013-08-01 MED ORDER — DEXTROSE 50 % IV SOLN
INTRAVENOUS | Status: AC
  Administered 2013-08-01: 08:00:00 25 mL
  Filled 2013-08-01: qty 50

## 2013-08-01 MED ORDER — METHADONE HCL 10 MG/ML PO CONC
10.0000 mg | Freq: Two times a day (BID) | ORAL | Status: DC
  Administered 2013-08-01 – 2013-08-03 (×4): 10 mg
  Filled 2013-08-01 (×4): qty 1

## 2013-08-01 MED ORDER — METHADONE HCL 10 MG/ML PO CONC: 10.0000 mg | Freq: Two times a day (BID) | ORAL | Status: DC

## 2013-08-01 MED ORDER — DEXTROSE 50 % IV SOLN: 25.0000 mL | Freq: Once | INTRAVENOUS | Status: AC | PRN

## 2013-08-01 MED ORDER — DEXTROSE-NACL 5-0.9 % IV SOLN
INTRAVENOUS | Status: DC
  Administered 2013-08-01 – 2013-08-03 (×3): via INTRAVENOUS

## 2013-08-01 MED ORDER — SODIUM CHLORIDE 0.9 % IJ SOLN
10.0000 mL | INTRAMUSCULAR | Status: DC | PRN
  Administered 2013-08-01 – 2013-08-02 (×2): 10 mL
  Administered 2013-08-02: 20 mL
  Administered 2013-08-03: 05:00:00 10 mL

## 2013-08-01 MED ORDER — POTASSIUM CHLORIDE 10 MEQ/100ML IV SOLN
10.0000 meq | INTRAVENOUS | Status: AC
  Administered 2013-08-01 (×3): 10 meq via INTRAVENOUS
  Filled 2013-08-01 (×3): qty 100

## 2013-08-01 MED ORDER — MORPHINE SULFATE 2 MG/ML IJ SOLN: 2.0000 mg | INTRAMUSCULAR | Status: DC | PRN

## 2013-08-01 MED ORDER — BISACODYL 5 MG PO TBEC
5.0000 mg | DELAYED_RELEASE_TABLET | Freq: Two times a day (BID) | ORAL | Status: DC
  Administered 2013-08-01 (×2): 5 mg via ORAL
  Filled 2013-08-01 (×2): qty 1

## 2013-08-01 MED ORDER — INSULIN ASPART 100 UNIT/ML ~~LOC~~ SOLN
0.0000 [IU] | SUBCUTANEOUS | Status: DC
Start: 2013-08-01 — End: 2013-08-03
  Administered 2013-08-01: 13:00:00 3 [IU] via SUBCUTANEOUS
  Administered 2013-08-01: 2 [IU] via SUBCUTANEOUS
  Administered 2013-08-02: 3 [IU] via SUBCUTANEOUS
  Administered 2013-08-02: 17:00:00 2 [IU] via SUBCUTANEOUS
  Administered 2013-08-02: 06:00:00 via SUBCUTANEOUS
  Administered 2013-08-02: 3 [IU] via SUBCUTANEOUS
  Administered 2013-08-02 (×2): 2 [IU] via SUBCUTANEOUS
  Administered 2013-08-03 (×2): 3 [IU] via SUBCUTANEOUS

## 2013-08-01 MED ORDER — METOCLOPRAMIDE HCL 5 MG/ML IJ SOLN
10.0000 mg | Freq: Four times a day (QID) | INTRAMUSCULAR | Status: DC
  Administered 2013-08-01 – 2013-08-03 (×9): 10 mg via INTRAVENOUS
  Filled 2013-08-01 (×15): qty 2

## 2013-08-01 NOTE — Progress Notes (Signed)
[facility] PROGRESS NOTE  OVAL BIRT [id number] DOB: [birth year] DOA: 07/30/2013 PCP: [name], MD  Assessment/Plan:  Diabetes type 2 uncontrolled -07/30/2013 HgA1C= 6.7 her indices within ADA guidelines) -Continue SSI for control of CBG while hospitalized -Patient had hypoglycemic episode overnight down to 65 was treated with D50 25 no and CBG return to 123   Diabetic gastroparesis -Patient now has a PICC line placed -Patient continue to have nausea and abdominal pain increase Reglan IV to Reglan QID, Phenergan max dose allowable for IV -Start trickle feeds through PEG tube.  -Change normal saline to D5 normal saline to prevent another hypoglycemic episode -Physical therapy consult and ensure patient participates with ROM, strengthening, ambulation exercises while hospitalized which will increase her gastric motility.  - D/C Narcotic medication  today which is most likely exacerbating patient's gastroparesis.  -Obtain acute abdominal series to R/O ileus  Vs SBO  -Obtain LDH, CK to R/O Bowel ischemia (unlikely) -Obtain lipase to R/O acute pancreatitis -If no improvement in the a.m. after restarting patient's methadone, will consult GI for additional recommendations  Intractable nausea and vomiting -see diabetic gastroparesis  HLD -Obtain lipid panel  HTN -Currently within ADA guidelines continue current medication  Hyponatremia -Should correct with hydration  Hypokalemia -Will replete IV,  Leukocytosis -Most likely secondary to stress, should correct with resolution of N./V. and hydration  Narcotic withdrawal -Patient exhibiting all the signs symptoms consistent with acute narcotic withdrawal. -Further review of her PTA medications reveals that patient was on methadone BID and had not received her methadone dose since admission. -Will restart methadone 10 mg BID -DC'd morphine and Dilaudid as this is most likely exacerbating patient's  gastroparesis   Code Status: Full Family Communication: No family members available Disposition Plan: Resolution of nausea and vomiting   Consultants:    Procedures: Placement PICC right arm 07/31/2013   Antibiotics:  HPI/Subjective: 43 y.o. BF PMHx uncontrolled DM, diabetic gastroparesis, s/p PEG tube, hypertension, hospitalizations every month for the above symptoms, comes intoday for persistent nausea , vomiting over the last 12 hours. On arrival to ED, she was tachycardic, hypertensive and in mild distress from abdominal pain. She also reports having watery diarrhea since this am. She denies fever , has chills, no sob, chest pain, orthopnea, pnd, syncope or pedal edema. Her labs revealed hyponatremia, leukocytosis. CT abd and plevis was done without contrast did not reveal any intraabdominal pathology. 07/31/2013 after unsuccessful attempt to place central venous line at bedside, IR was again contacted and agreed to squeeze patient to schedule in order to place a multilumen PICC line. Patient complains of abdominal pain, nausea not relieved with medication via PEG tube. 08/01/2012 patient in moderate to severe abdominal pain rated 8/10, requesting additional pain medication. Patient diaphoretic, tachypnea, tachycardic.    Objective: Filed Vitals:   07/31/13 1347 07/31/13 1727 07/31/13 2021 08/01/13 0534  BP: 110/62  138/76 113/67  Pulse: 98  101 96  Temp: 99.3 F (37.4 C)  99.1 F (37.3 C) 98.7 F (37.1 C)  TempSrc: Oral  Oral Oral  Resp: 20  16 16   Height:      Weight:  69.854 kg (154 lb)    SpO2: 100%  100% 95%    Intake/Output Summary (Last 24 hours) at 08/01/13 0806 Last data filed at 08/01/13 0600  Gross per 24 hour  Intake 1562.5 ml  Output    800 ml  Net  762.5 ml   Filed Weights   07/30/13 1653 07/31/13 1727  Weight: 67.7  kg (149 lb 4 oz) 69.854 kg (154 lb)    Exam:   General:  A./O. x4, moderate distress secondary to diabetic gastroparesis, narcotic  withdrawal  Cardiovascular: Tachycardic, regular rhythm, negative murmurs rubs or gallops, DP/PT pulse one plus bilateral  Respiratory: Clear to auscultation bilateral  Abdomen: Diffuse abdominal pain to mild palpation (patient guarding and will not allow full exam), PEG tube present negative sign of infection  Musculoskeletal: Negative pedal edema   Data Reviewed: Basic Metabolic Panel:  Recent Labs Lab 07/30/13 1234 07/31/13 0425 07/31/13 0900 08/01/13 0500  NA 129* 128*  --  135*  K 4.0 3.7  --  3.0*  CL 85* 89*  --  99  CO2 21 21  --  25  GLUCOSE 342* 336*  --  76  BUN 21 31*  --  34*  CREATININE 0.76 0.98  --  1.30*  CALCIUM 9.9 9.0  --  8.5  MG  --   --  1.6 1.6   Liver Function Tests:  Recent Labs Lab 07/30/13 1234 07/31/13 0900 08/01/13 0500  AST 16 18 15   ALT 9 8 7   ALKPHOS 127* 93 76  BILITOT 0.3 <0.2* <0.2*  PROT 8.4* 6.8 6.1  ALBUMIN 3.3* 2.8* 2.6*    Recent Labs Lab 07/30/13 1234  LIPASE 14   No results found for this basename: AMMONIA,  in the last 168 hours CBC:  Recent Labs Lab 07/30/13 1234 07/31/13 0900 08/01/13 0500  WBC 19.5* 16.4* 11.0*  NEUTROABS 16.6* 13.5* 7.0  HGB 13.8 12.7 10.7*  HCT 39.2 35.4* 32.2*  MCV 86.9 85.5 88.5  PLT 341 PLATELET CLUMPS NOTED ON SMEAR, COUNT APPEARS ADEQUATE 227   Cardiac Enzymes: No results found for this basename: CKTOTAL, CKMB, CKMBINDEX, TROPONINI,  in the last 168 hours BNP (last 3 results) No results found for this basename: PROBNP,  in the last 8760 hours CBG:  Recent Labs Lab 07/30/13 2217 07/31/13 0748 07/31/13 1134 07/31/13 1703 07/31/13 2150  GLUCAP 410* 271* 248* 110* 105*    No results found for this or any previous visit (from the past 240 hour(s)).   Studies: Ct Abdomen Pelvis W Contrast  07/30/2013   CLINICAL DATA:  Abdominal pain, nausea, vomiting, diarrhea, history of hysterectomy, PEG tube, cholecystectomy, partial colon resection, diverticulitis  EXAM: CT ABDOMEN  AND PELVIS WITH CONTRAST  TECHNIQUE: Multidetector CT imaging of the abdomen and pelvis was performed using the standard protocol following bolus administration of intravenous contrast.  CONTRAST:  136mL OMNIPAQUE IOHEXOL 300 MG/ML  SOLN  COMPARISON:  05/27/2013  FINDINGS: Visualized portions of lung bases clear.  Study is effectively performed without contrast, as there was IV infiltration of the injected dose. The patient's referring physician and nurse were notified. There is also no oral contrast.  Allowing for the absence of contrast, there is grossly normal liver, spleen, and pancreas. There is a small hiatal hernia. The patient is status post cholecystectomy. Hip common bile duct is mildly prominent. This is likely due to prior cholecystectomy. There is a percutaneous gastrostomy tube with tip in the stomach. Adrenal glands are normal. Kidneys are normal.  There is a nonobstructive bowel gas pattern. There is left upper quadrant colon anastomosis. There is no free air or fluid in the abdomen or pelvis. There is no evidence of focal inflammatory process.  Pelvic organs appear to be absent. The bladder is distended. There are no acute musculoskeletal findings.  IMPRESSION: No acute abnormality. Study is effectively performed without contrast  given intravenous infiltration of the contrast does as described above.   Electronically Signed   By: Skipper Cliche M.D.   On: 07/30/2013 14:11   Ir Fluoro Guide Cv Line Right  07/31/2013   CLINICAL DATA:  43 year old needs IV access.  EXAM: POWER INJECTABLE PICC LINE PLACEMENT WITH ULTRASOUND AND FLUOROSCOPIC GUIDANCE  FLUOROSCOPY TIME:  6 seconds  PROCEDURE: The patient was advised of the possible risks and complications and agreed to undergo the procedure. The patient was then brought to the angiographic suite for the procedure.  The right arm was prepped with chlorhexidine, draped in the usual sterile fashion using maximum barrier technique (cap and mask, sterile  gown, sterile gloves, large sterile sheet, hand hygiene and cutaneous antisepsis) and infiltrated locally with 1% Lidocaine.  Ultrasound demonstrated patency of the right brachial vein, and this was documented with an image. Under real-time ultrasound guidance, this vein was accessed with a 21 gauge micropuncture needle and image documentation was performed. A 0.018 wire was introduced into the vein. A peel-away sheath was placed. A 5 French dual lumen power injectable PICC was advanced to the lower SVC. Fluoroscopy during the procedure and fluoro spot radiograph confirms appropriate catheter position. Catheter was sutured to the skin. The catheter was flushed and covered with a sterile dressing.  Complications: None  IMPRESSION: Successful right arm power injectable PICC line placement with ultrasound and fluoroscopic guidance. The catheter is ready for use.   Electronically Signed   By: Markus Daft M.D.   On: 07/31/2013 16:32   Ir US Guide Vasc Access Right  07/31/2013   CLINICAL DATA:  43 year old needs IV access.  EXAM: POWER INJECTABLE PICC LINE PLACEMENT WITH ULTRASOUND AND FLUOROSCOPIC GUIDANCE  FLUOROSCOPY TIME:  6 seconds  PROCEDURE: The patient was advised of the possible risks and complications and agreed to undergo the procedure. The patient was then brought to the angiographic suite for the procedure.  The right arm was prepped with chlorhexidine, draped in the usual sterile fashion using maximum barrier technique (cap and mask, sterile gown, sterile gloves, large sterile sheet, hand hygiene and cutaneous antisepsis) and infiltrated locally with 1% Lidocaine.  Ultrasound demonstrated patency of the right brachial vein, and this was documented with an image. Under real-time ultrasound guidance, this vein was accessed with a 21 gauge micropuncture needle and image documentation was performed. A 0.018 wire was introduced into the vein. A peel-away sheath was placed. A 5 French dual lumen power  injectable PICC was advanced to the lower SVC. Fluoroscopy during the procedure and fluoro spot radiograph confirms appropriate catheter position. Catheter was sutured to the skin. The catheter was flushed and covered with a sterile dressing.  Complications: None  IMPRESSION: Successful right arm power injectable PICC line placement with ultrasound and fluoroscopic guidance. The catheter is ready for use.   Electronically Signed   By: Markus Daft M.D.   On: 07/31/2013 16:32   Dg Chest Port 1 View  07/30/2013   CLINICAL DATA:  Vomiting, evaluate for aspiration  EXAM: PORTABLE CHEST - 1 VIEW  COMPARISON:  05/27/2013  FINDINGS: The heart size and mediastinal contours are within normal limits. Both lungs are clear. The visualized skeletal structures are unremarkable.  IMPRESSION: No active disease.   Electronically Signed   By: Skipper Cliche M.D.   On: 07/30/2013 18:19    Scheduled Meds: . acetaminophen  650 mg Per Tube TID  . busPIRone  5 mg Per Tube TID  . enoxaparin  40 mg  Subcutaneous Q24H  . gabapentin  600 mg Per Tube TID  . insulin aspart  0-15 Units Subcutaneous TID WC  . insulin glargine  15 Units Subcutaneous QHS  . labetalol  100 mg Per Tube BID  . lisinopril  10 mg Per Tube Daily  . metoCLOPramide (REGLAN) injection  10 mg Intravenous Q8H  . mirtazapine  15 mg Per Tube QHS  . mometasone-formoterol  2 puff Inhalation BID  . multivitamin  5 mL Per Tube Daily  . sucralfate  1 g Per Tube TID WC & HS  . torsemide  20 mg Per Tube Daily  . zolpidem  10 mg Per Tube QHS   Continuous Infusions: . sodium chloride 125 mL/hr at 08/01/13 0112  . feeding supplement (VITAL AF 1.2 CAL)      Active Problems:   Gastroparesis   Intractable nausea and vomiting   Dyslipidemia   GERD (gastroesophageal reflux disease)   Hypertension   DM (diabetes mellitus), type 2, uncontrolled   Dehydration   Hyponatremia   Leukocytosis   Nausea & vomiting    Time spent: 90 minutes    Byrant Valent,  J  [facility] Pager [phone]. If 7PM-7AM, please contact night-coverage at [email or web address], password [facility] 08/01/2013, 8:06 AM  LOS: 2 days

## 2013-08-01 NOTE — Progress Notes (Signed)
Patient has not had a bowel movement since PTA.  Will d/c enteric precautions per protocol and scope of practice.

## 2013-08-01 NOTE — Progress Notes (Signed)
Hypoglycemic Event  CBG: 65  Treatment: D50 IV 25 mL  Symptoms: None  Follow-up CBG: Time:758 CBG Result:123  Possible Reasons for Event: Inadequate meal intake  Comments/MD notified:Dr. Ok Edwards D  Remember to initiate Hypoglycemia Order Set & complete

## 2013-08-01 NOTE — Progress Notes (Signed)
Patient refusing tube feed.  Dr. Sherral Hammers notified.  D5NS infusion started and CBG/SSI coverage changed to Q4 coverage.

## 2013-08-01 NOTE — Evaluation (Signed)
Physical Therapy Evaluation Patient Details Name: [name] MRN: [id number] DOB: [birth year] Today's Date: 08/01/2013 Time: 0752-0809 PT Time Calculation (min): 17 min  PT Assessment / Plan / Recommendation History of Present Illness  [name] is a 43 y.o. female with prior h/o uncontrolled DM, diabetic gastroparesis, s/p PEG tube, hypertension, hospitalizations every month for the above symptoms, comes intoday for persistent nausea , vomiting over the last 12 hours. On arrival to ED, she was tachycardic, hypertensive and in mild distress from abdominal pain. She also reports having watery diarrhea since this am.  Clinical Impression  Pt moving well despite nausea and abdominal pain.  Will continue to follow to ensure safety and increased activity.      PT Assessment  Patient needs continued PT services    Follow Up Recommendations  Home health PT;Supervision - Intermittent    Does the patient have the potential to tolerate intense rehabilitation      Barriers to Discharge        Equipment Recommendations  None recommended by PT    Recommendations for Other Services     Frequency Min 3X/week    Precautions / Restrictions Precautions Precautions: Fall Restrictions Weight Bearing Restrictions: No   Pertinent Vitals/Pain "I feel awful."  RN made aware.        Mobility  Bed Mobility Bed Mobility: Supine to Sit;Sitting - Scoot to Edge of Bed;Sit to Supine Supine to Sit: 6: Modified independent (Device/Increase time);With rails;HOB flat Sitting - Scoot to Edge of Bed: 6: Modified independent (Device/Increase time) Sit to Supine: 6: Modified independent (Device/Increase time);HOB flat Details for Bed Mobility Assistance: pt moves slowly and needs use of rails to complete without A.   Transfers Transfers: Sit to Stand;Stand to Sit Sit to Stand: 5: Supervision;With upper extremity assist;From bed;From toilet Stand to Sit: 5: Supervision;With upper extremity assist;To  toilet;To bed Details for Transfer Assistance: pt again moving slowly and relies on UEs to A with mobility.   Ambulation/Gait Ambulation/Gait Assistance: 4: Min guard Ambulation Distance (Feet): 12 Feet (x2) Assistive device: None Ambulation/Gait Assistance Details: pt with staggered gait and flexed posture 2/2 abdominal pain and nausea.   Gait Pattern: Step-through pattern;Decreased stride length;Trunk flexed Stairs: No Wheelchair Mobility Wheelchair Mobility: No    Exercises     PT Diagnosis: Difficulty walking  PT Problem List: Decreased activity tolerance;Decreased balance;Decreased mobility;Decreased knowledge of use of DME;Pain PT Treatment Interventions: DME instruction;Gait training;Functional mobility training;Therapeutic activities;Therapeutic exercise;Balance training;Patient/family education     PT Goals(Current goals can be found in the care plan section) Acute Rehab PT Goals Patient Stated Goal: Feel better PT Goal Formulation: With patient Time For Goal Achievement: 08/15/13 Potential to Achieve Goals: Good  Visit Information  Last PT Received On: 08/01/13 Assistance Needed: +1 History of Present Illness: [name] is a 43 y.o. female with prior h/o uncontrolled DM, diabetic gastroparesis, s/p PEG tube, hypertension, hospitalizations every month for the above symptoms, comes intoday for persistent nausea , vomiting over the last 12 hours. On arrival to ED, she was tachycardic, hypertensive and in mild distress from abdominal pain. She also reports having watery diarrhea since this am.       Prior [place] expects to be discharged to:: Private residence Living Arrangements: Other relatives (Sister) Available Help at Discharge: Family;Available 24 hours/day Type of Home: House Home Access: Level entry Home Layout: One level Home Equipment: Cane - single point Prior Function Level of Independence:  Independent Communication Communication: No  difficulties    Cognition  Cognition Arousal/Alertness: Awake/alert Behavior During Therapy: WFL for tasks assessed/performed Overall Cognitive Status: Within Functional Limits for tasks assessed    Extremity/Trunk Assessment Upper Extremity Assessment Upper Extremity Assessment: Overall WFL for tasks assessed Lower Extremity Assessment Lower Extremity Assessment: Overall WFL for tasks assessed   Balance Balance Balance Assessed: No  End of Session PT - End of Session Activity Tolerance: Patient limited by pain Patient left: in bed;with call bell/phone within reach;with bed alarm set Nurse Communication: Mobility status  GP     Catarina Hartshorn, [place] 08/01/2013, 8:17 AM

## 2013-08-02 DIAGNOSIS — Z765 Malingerer [conscious simulation]: Secondary | ICD-10-CM | POA: Diagnosis present

## 2013-08-02 DIAGNOSIS — K560 Paralytic ileus: Secondary | ICD-10-CM

## 2013-08-02 DIAGNOSIS — F1910 Other psychoactive substance abuse, uncomplicated: Secondary | ICD-10-CM

## 2013-08-02 LAB — CBC WITH DIFFERENTIAL/PLATELET
BASOS PCT: 0 % (ref 0–1)
Basophils Absolute: 0 10*3/uL (ref 0.0–0.1)
EOS ABS: 0 10*3/uL (ref 0.0–0.7)
EOS PCT: 0 % (ref 0–5)
HCT: 29 % — ABNORMAL LOW (ref 36.0–46.0)
Hemoglobin: 9.8 g/dL — ABNORMAL LOW (ref 12.0–15.0)
Lymphocytes Relative: 21 % (ref 12–46)
Lymphs Abs: 2.3 10*3/uL (ref 0.7–4.0)
MCH: 29.8 pg (ref 26.0–34.0)
MCHC: 33.8 g/dL (ref 30.0–36.0)
MCV: 88.1 fL (ref 78.0–100.0)
Monocytes Absolute: 0.7 10*3/uL (ref 0.1–1.0)
Monocytes Relative: 6 % (ref 3–12)
NEUTROS PCT: 73 % (ref 43–77)
Neutro Abs: 8 10*3/uL — ABNORMAL HIGH (ref 1.7–7.7)
Platelets: 191 10*3/uL (ref 150–400)
RBC: 3.29 MIL/uL — ABNORMAL LOW (ref 3.87–5.11)
RDW: 13.9 % (ref 11.5–15.5)
WBC: 11 10*3/uL — ABNORMAL HIGH (ref 4.0–10.5)

## 2013-08-02 LAB — COMPREHENSIVE METABOLIC PANEL
ALK PHOS: 67 U/L (ref 39–117)
ALT: 8 U/L (ref 0–35)
AST: 14 U/L (ref 0–37)
Albumin: 2.4 g/dL — ABNORMAL LOW (ref 3.5–5.2)
BILIRUBIN TOTAL: 0.2 mg/dL — AB (ref 0.3–1.2)
BUN: 15 mg/dL (ref 6–23)
CHLORIDE: 101 meq/L (ref 96–112)
CO2: 24 mEq/L (ref 19–32)
CREATININE: 0.81 mg/dL (ref 0.50–1.10)
Calcium: 8 mg/dL — ABNORMAL LOW (ref 8.4–10.5)
GFR calc Af Amer: >90 mL/min (ref >90)
GFR, EST NON AFRICAN AMERICAN: 88 mL/min — AB (ref >90)
Glucose, Bld: 142 mg/dL — ABNORMAL HIGH (ref 70–99)
POTASSIUM: 3 meq/L — AB (ref 3.7–5.3)
Sodium: 136 mEq/L — ABNORMAL LOW (ref 137–147)
Total Protein: 5.7 g/dL — ABNORMAL LOW (ref 6.0–8.3)

## 2013-08-02 LAB — GLUCOSE, CAPILLARY
GLUCOSE-CAPILLARY: 129 mg/dL — AB (ref 70–99)
GLUCOSE-CAPILLARY: 148 mg/dL — AB (ref 70–99)
GLUCOSE-CAPILLARY: 162 mg/dL — AB (ref 70–99)
Glucose-Capillary: 172 mg/dL — ABNORMAL HIGH (ref 70–99)
Glucose-Capillary: 143 mg/dL — ABNORMAL HIGH (ref 70–99)
Glucose-Capillary: 198 mg/dL — ABNORMAL HIGH (ref 70–99)
Glucose-Capillary: 128 mg/dL — ABNORMAL HIGH (ref 70–99)

## 2013-08-02 LAB — C-REACTIVE PROTEIN

## 2013-08-02 LAB — MAGNESIUM
MAGNESIUM: 1.3 mg/dL — AB (ref 1.5–2.5)
Magnesium: 1.8 mg/dL (ref 1.5–2.5)

## 2013-08-02 LAB — POTASSIUM: Potassium: 3.2 mEq/L — ABNORMAL LOW (ref 3.7–5.3)

## 2013-08-02 MED ORDER — MAGNESIUM SULFATE 50 % IJ SOLN
3.0000 g | Freq: Once | INTRAVENOUS | Status: AC
  Administered 2013-08-02: 3 g via INTRAVENOUS
  Filled 2013-08-02: qty 6

## 2013-08-02 MED ORDER — MAGNESIUM SULFATE 50 % IJ SOLN
3.0000 g | Freq: Once | INTRAVENOUS | Status: AC
  Administered 2013-08-02: 12:00:00 3 g via INTRAVENOUS
  Filled 2013-08-02: qty 6

## 2013-08-02 MED ORDER — POTASSIUM CHLORIDE 10 MEQ/100ML IV SOLN
10.0000 meq | INTRAVENOUS | Status: AC
  Administered 2013-08-02 – 2013-08-03 (×4): 10 meq via INTRAVENOUS
  Filled 2013-08-02 (×4): qty 100

## 2013-08-02 MED ORDER — BISACODYL 10 MG RE SUPP
10.0000 mg | Freq: Every day | RECTAL | Status: DC
  Filled 2013-08-02: qty 1

## 2013-08-02 MED ORDER — POTASSIUM CHLORIDE 10 MEQ/100ML IV SOLN
10.0000 meq | INTRAVENOUS | Status: AC
  Administered 2013-08-02 (×3): 10 meq via INTRAVENOUS
  Filled 2013-08-02 (×3): qty 100

## 2013-08-02 NOTE — Progress Notes (Addendum)
[facility] PROGRESS NOTE  [name] [id number] DOB: October 02, 1970 DOA: 07/30/2013 PCP: [name], MD  Assessment/Plan:  Diabetes type 2 uncontrolled -07/30/2013 HgA1C= 6.7 her indices within ADA guidelines) -Continue SSI for control of CBG while hospitalized -Well controlled overnight continue D5 normal saline 119ml/hr secondary to patient refusing to feeding per PEG   Diabetic gastroparesis -Patient now has a PICC line placed -Patient continue to have nausea and abdominal pain increase Reglan IV to Reglan QID, Phenergan max dose allowable for IV -PT REFUSES TO START trickle feeds through PEG tube.  -Physical therapy recommends home health  - PT REFUSES to ambulate while in the hospital other than to the bathroom - Pt on home dose of narcotic medication (methadone 10 mg BID); all other Narcotic medication has been DC'd; spent considerable time explaining to patient that these medications were likely exacerbating her gastroparesis.  -acute abdominal series; shows possible ileus, most likely secondary to large chronic narcotic use and Hx  poorly controlled diabetes -Obtain lipase; WNL  -Patient refuses to participate in plan of care. Patient refuses to allow trickle feeds. Patient with drug-seeking behavior, continues to request additional narcotics even though patient is on her home dose of methadone, and a large amount of Phenergan, and maximum dose of Reglan. Patient has no measurable signs of pain and is clearly seeking additional drug medication.   Intractable nausea and vomiting -see diabetic gastroparesis  HLD -Obtain lipid panel  HTN -Currently within ADA guidelines continue current medication  Hyponatremia -Should correct with hydration  Hypokalemia -Will replete IV,  Hypomagnesemia -Will replete IV  Leukocytosis -Most likely secondary to stress (narcotic withdrawal), should correct with resolution of N./V. and hydration  Narcotic withdrawal -Patient  signs and symptoms were of narcotic withdrawal have resolved with patient being placed back on home dose of methadone methadone 10 mg BID -DC'd morphine and Dilaudid as this is most likely exacerbating patient's gastroparesis  Drug-seeking behavior -Patient clearly seeking more narcotics without exhibiting signs and symptoms of the need. -Patient is refusing further care without additional narcotic medication, if this continues overnight will discharge in the a.m.   Code Status: Full Family Communication: No family members available Disposition Plan: Resolution of nausea and vomiting   Consultants:    Procedures: Acute abdominal series 08/01/2013 Abnormal but nonspecific bowel gas pattern with air-fluid levels in  the descending colon and in nondilated loops of left-sided small  bowel. Appearance could reflect ileus, diarrheal process, or less  likely orally obstruction. Right abdomen is gasless.  Placement PICC right arm 07/31/2013    Antibiotics:  HPI/Subjective: 43 y.o. BF PMHx uncontrolled DM, diabetic gastroparesis, s/p PEG tube, hypertension, hospitalizations every month for the above symptoms, comes intoday for persistent nausea , vomiting over the last 12 hours. On arrival to ED, she was tachycardic, hypertensive and in mild distress from abdominal pain. She also reports having watery diarrhea since this am. She denies fever , has chills, no sob, chest pain, orthopnea, pnd, syncope or pedal edema. Her labs revealed hyponatremia, leukocytosis. CT abd and plevis was done without contrast did not reveal any intraabdominal pathology. 07/31/2013 after unsuccessful attempt to place central venous line at bedside, IR was again contacted and agreed to squeeze patient to schedule in order to place a multilumen PICC line. Patient complains of abdominal pain, nausea not relieved with medication via PEG tube. 08/01/2012 patient in moderate to severe abdominal pain rated 8/10, requesting  additional pain medication. Patient diaphoretic, tachypnea, tachycardic. 08/02/2013 patient sitting comfortably in bed  and negative diaphoresis, negative SOB, negative tachycardic, negative tachypnea. Demanding additional narcotic medication    Objective: Filed Vitals:   08/01/13 1700 08/01/13 2008 08/01/13 2052 08/02/13 0414  BP:  139/89  97/52  Pulse:  90  94  Temp:  99 F (37.2 C)  99.4 F (37.4 C)  TempSrc:  Oral  Oral  Resp:  18  18  Height:      Weight: 69 kg (152 lb 1.9 oz)   69.4 kg (153 lb)  SpO2:  100% 97% 95%    Intake/Output Summary (Last 24 hours) at 08/02/13 0845 Last data filed at 08/02/13 0755  Gross per 24 hour  Intake 3062.5 ml  Output   3301 ml  Net -238.5 ml   Filed Weights   07/31/13 1727 08/01/13 1700 08/02/13 0414  Weight: 69.854 kg (154 lb) 69 kg (152 lb 1.9 oz) 69.4 kg (153 lb)    Exam:   General:  A./O. x4, NAD.  Cardiovascular: Patient refused exam   Respiratory: Patient refused exam l  Abdomen: Patient refused exam   Musculoskeletal: Patient refused exam   Data Reviewed: Basic Metabolic Panel:  Recent Labs Lab 07/30/13 1234 07/31/13 0425 07/31/13 0900 08/01/13 0500 08/02/13 0606  NA 129* 128*  --  135* 136*  K 4.0 3.7  --  3.0* 3.0*  CL 85* 89*  --  99 101  CO2 21 21  --  25 24  GLUCOSE 342* 336*  --  76 142*  BUN 21 31*  --  34* 15  CREATININE 0.76 0.98  --  1.30* 0.81  CALCIUM 9.9 9.0  --  8.5 8.0*  MG  --   --  1.6 1.6 1.3*   Liver Function Tests:  Recent Labs Lab 07/30/13 1234 07/31/13 0900 08/01/13 0500 08/02/13 0606  AST 16 18 15 14   ALT 9 8 7 8   ALKPHOS 127* 93 76 67  BILITOT 0.3 <0.2* <0.2* 0.2*  PROT 8.4* 6.8 6.1 5.7*  ALBUMIN 3.3* 2.8* 2.6* 2.4*    Recent Labs Lab 07/30/13 1234 08/01/13 1705 08/01/13 1725  LIPASE 14 13  --   AMYLASE  --   --  27   No results found for this basename: AMMONIA,  in the last 168 hours CBC:  Recent Labs Lab 07/30/13 1234 07/31/13 0900 08/01/13 0500  08/02/13 0606  WBC 19.5* 16.4* 11.0* 11.0*  NEUTROABS 16.6* 13.5* 7.0 8.0*  HGB 13.8 12.7 10.7* 9.8*  HCT 39.2 35.4* 32.2* 29.0*  MCV 86.9 85.5 88.5 88.1  PLT 341 PLATELET CLUMPS NOTED ON SMEAR, COUNT APPEARS ADEQUATE 227 191   Cardiac Enzymes:  Recent Labs Lab 08/01/13 1705  CKTOTAL 139  CKMB 1.0   BNP (last 3 results) No results found for this basename: PROBNP,  in the last 8760 hours CBG:  Recent Labs Lab 08/01/13 1147 08/01/13 1653 08/01/13 2007 08/02/13 0127 08/02/13 0413  GLUCAP 184* 136* 101* 172* 143*    No results found for this or any previous visit (from the past 240 hour(s)).   Studies: Ir Fluoro Guide Cv Line Right  07/31/2013   CLINICAL DATA:  43 year old needs IV access.  EXAM: POWER INJECTABLE PICC LINE PLACEMENT WITH ULTRASOUND AND FLUOROSCOPIC GUIDANCE  FLUOROSCOPY TIME:  6 seconds  PROCEDURE: The patient was advised of the possible risks and complications and agreed to undergo the procedure. The patient was then brought to the angiographic suite for the procedure.  The right arm was prepped with chlorhexidine, draped in the usual  sterile fashion using maximum barrier technique (cap and mask, sterile gown, sterile gloves, large sterile sheet, hand hygiene and cutaneous antisepsis) and infiltrated locally with 1% Lidocaine.  Ultrasound demonstrated patency of the right brachial vein, and this was documented with an image. Under real-time ultrasound guidance, this vein was accessed with a 21 gauge micropuncture needle and image documentation was performed. A 0.018 wire was introduced into the vein. A peel-away sheath was placed. A 5 French dual lumen power injectable PICC was advanced to the lower SVC. Fluoroscopy during the procedure and fluoro spot radiograph confirms appropriate catheter position. Catheter was sutured to the skin. The catheter was flushed and covered with a sterile dressing.  Complications: None  IMPRESSION: Successful right arm power injectable  PICC line placement with ultrasound and fluoroscopic guidance. The catheter is ready for use.   Electronically Signed   By: Markus Daft M.D.   On: 07/31/2013 16:32   Ir US Guide Vasc Access Right  07/31/2013   CLINICAL DATA:  43 year old needs IV access.  EXAM: POWER INJECTABLE PICC LINE PLACEMENT WITH ULTRASOUND AND FLUOROSCOPIC GUIDANCE  FLUOROSCOPY TIME:  6 seconds  PROCEDURE: The patient was advised of the possible risks and complications and agreed to undergo the procedure. The patient was then brought to the angiographic suite for the procedure.  The right arm was prepped with chlorhexidine, draped in the usual sterile fashion using maximum barrier technique (cap and mask, sterile gown, sterile gloves, large sterile sheet, hand hygiene and cutaneous antisepsis) and infiltrated locally with 1% Lidocaine.  Ultrasound demonstrated patency of the right brachial vein, and this was documented with an image. Under real-time ultrasound guidance, this vein was accessed with a 21 gauge micropuncture needle and image documentation was performed. A 0.018 wire was introduced into the vein. A peel-away sheath was placed. A 5 French dual lumen power injectable PICC was advanced to the lower SVC. Fluoroscopy during the procedure and fluoro spot radiograph confirms appropriate catheter position. Catheter was sutured to the skin. The catheter was flushed and covered with a sterile dressing.  Complications: None  IMPRESSION: Successful right arm power injectable PICC line placement with ultrasound and fluoroscopic guidance. The catheter is ready for use.   Electronically Signed   By: Markus Daft M.D.   On: 07/31/2013 16:32   Dg Abd Acute W/chest  08/01/2013   CLINICAL DATA:  Abdominal pain.  Nausea and vomiting.  EXAM: ACUTE ABDOMEN SERIES (ABDOMEN 2 VIEW & CHEST 1 VIEW)  COMPARISON:  Multiple exams, including 07/30/2013  FINDINGS: Right PICC line tip: SVC. Cardiac and mediastinal margins appear normal. The lungs appear  clear.  No free intraperitoneal gas observed beneath the hemidiaphragms. There are some air-fluid levels in the proximal descending colon is and in nondilated left-sided loops of small bowel. The right abdomen is gasless. Peg tube noted.  Prior low lower lumbar spine scratch have postoperative findings in the lower lumbar spine.  IMPRESSION: Abnormal but nonspecific bowel gas pattern with air-fluid levels in the descending colon and in nondilated loops of left-sided small bowel. Appearance could reflect ileus, diarrheal process, or less likely orally obstruction. Right abdomen is gasless.   Electronically Signed   By: Sherryl Barters M.D.   On: 08/01/2013 16:27    Scheduled Meds: . acetaminophen  650 mg Per Tube TID  . bisacodyl  5 mg Oral BID  . busPIRone  5 mg Per Tube TID  . enoxaparin  40 mg Subcutaneous Q24H  . gabapentin  600 mg Per  Tube TID  . insulin aspart  0-15 Units Subcutaneous Q4H  . insulin glargine  15 Units Subcutaneous QHS  . labetalol  100 mg Per Tube BID  . lisinopril  10 mg Per Tube Daily  . methadone  10 mg Per Tube Q12H  . metoCLOPramide (REGLAN) injection  10 mg Intravenous Q6H  . mirtazapine  15 mg Per Tube QHS  . mometasone-formoterol  2 puff Inhalation BID  . multivitamin  5 mL Per Tube Daily  . sucralfate  1 g Per Tube TID WC & HS  . torsemide  20 mg Per Tube Daily  . zolpidem  10 mg Per Tube QHS   Continuous Infusions: . dextrose 5 % and 0.9% NaCl 125 mL/hr at 08/01/13 0930  . feeding supplement (VITAL AF 1.2 CAL)      Active Problems:   Gastroparesis   Intractable nausea and vomiting   Dyslipidemia   GERD (gastroesophageal reflux disease)   Hypertension   DM (diabetes mellitus), type 2, uncontrolled   Dehydration   Hyponatremia   Leukocytosis   Nausea & vomiting   Narcotic withdrawal    Time spent: 60 minutes    Zakhi Dupre, J  [facility] Pager [phone]. If 7PM-7AM, please contact night-coverage at [email or web address], password  [facility] 08/02/2013, 8:45 AM  LOS: 3 days

## 2013-08-02 NOTE — Progress Notes (Signed)
Patient has small BM in commode, reminded to use the graduate next time--BM small formed dark stool. SRP, RN

## 2013-08-03 DIAGNOSIS — E1149 Type 2 diabetes mellitus with other diabetic neurological complication: Secondary | ICD-10-CM

## 2013-08-03 DIAGNOSIS — K920 Hematemesis: Secondary | ICD-10-CM

## 2013-08-03 DIAGNOSIS — E1142 Type 2 diabetes mellitus with diabetic polyneuropathy: Secondary | ICD-10-CM

## 2013-08-03 DIAGNOSIS — R042 Hemoptysis: Secondary | ICD-10-CM | POA: Diagnosis present

## 2013-08-03 LAB — COMPREHENSIVE METABOLIC PANEL
ALBUMIN: 2.3 g/dL — AB (ref 3.5–5.2)
ALT: 7 U/L (ref 0–35)
AST: 14 U/L (ref 0–37)
Alkaline Phosphatase: 74 U/L (ref 39–117)
BILIRUBIN TOTAL: 0.2 mg/dL — AB (ref 0.3–1.2)
BUN: 9 mg/dL (ref 6–23)
CHLORIDE: 100 meq/L (ref 96–112)
CO2: 20 mEq/L (ref 19–32)
Calcium: 8.2 mg/dL — ABNORMAL LOW (ref 8.4–10.5)
Creatinine, Ser: 0.73 mg/dL (ref 0.50–1.10)
GFR calc Af Amer: >90 mL/min (ref >90)
GFR calc non Af Amer: >90 mL/min (ref >90)
GLUCOSE: 78 mg/dL (ref 70–99)
POTASSIUM: 3.4 meq/L — AB (ref 3.7–5.3)
SODIUM: 133 meq/L — AB (ref 137–147)
Total Protein: 5.8 g/dL — ABNORMAL LOW (ref 6.0–8.3)

## 2013-08-03 LAB — CBC WITH DIFFERENTIAL/PLATELET
BASOS ABS: 0 10*3/uL (ref 0.0–0.1)
Basophils Relative: 0 % (ref 0–1)
EOS ABS: 0.1 10*3/uL (ref 0.0–0.7)
Eosinophils Relative: 0 % (ref 0–5)
HCT: 28.7 % — ABNORMAL LOW (ref 36.0–46.0)
Hemoglobin: 9.4 g/dL — ABNORMAL LOW (ref 12.0–15.0)
LYMPHS PCT: 20 % (ref 12–46)
Lymphs Abs: 2.4 10*3/uL (ref 0.7–4.0)
MCH: 29.3 pg (ref 26.0–34.0)
MCHC: 32.8 g/dL (ref 30.0–36.0)
MCV: 89.4 fL (ref 78.0–100.0)
MONO ABS: 0.8 10*3/uL (ref 0.1–1.0)
Monocytes Relative: 7 % (ref 3–12)
Neutro Abs: 8.7 10*3/uL — ABNORMAL HIGH (ref 1.7–7.7)
Neutrophils Relative %: 73 % (ref 43–77)
PLATELETS: 152 10*3/uL (ref 150–400)
RBC: 3.21 MIL/uL — ABNORMAL LOW (ref 3.87–5.11)
RDW: 14.1 % (ref 11.5–15.5)
WBC: 12 10*3/uL — AB (ref 4.0–10.5)

## 2013-08-03 LAB — MAGNESIUM: MAGNESIUM: 2.5 mg/dL (ref 1.5–2.5)

## 2013-08-03 LAB — GLUCOSE, CAPILLARY
Glucose-Capillary: 88 mg/dL (ref 70–99)
Glucose-Capillary: 101 mg/dL — ABNORMAL HIGH (ref 70–99)
Glucose-Capillary: 156 mg/dL — ABNORMAL HIGH (ref 70–99)

## 2013-08-03 MED ORDER — LABETALOL HCL 100 MG PO TABS: 100.0000 mg | ORAL_TABLET | Freq: Two times a day (BID) | ORAL | Status: DC

## 2013-08-03 NOTE — Progress Notes (Signed)
Pt is a COPD Gold pt, but follow up appt cannot be made at time of discharge, because it is Saturday.

## 2013-08-03 NOTE — Discharge Summary (Signed)
Physician Discharge Summary  [name] [id number] DOB: 09-01-1970 DOA: 07/30/2013  PCP: [name], MD  Admit date: 07/30/2013 Discharge date: 08/03/2013  Time spent: 87minutes  Recommendations for Outpatient Follow-up:  Drug-seeking behavior  -Patient clearly seeking more narcotics without exhibiting signs and symptoms of the need.  -Patient is refusing further care without additional narcotic medication. Discharge home to followup with PCP. -Patient on methadone solution 10 mg BID via PEG, chronically. Again attempt is counseled patient that long-term use of this medication would continue to exacerbate her signs and symptoms of nausea/vomiting/abdominal pain secondary to its side effect of decreasing gastric/intestinal motility. Patient WAS NOT receptive to this explanation of her symptoms.  -Counseled patient on speaking with her PCP concerning her methadone dependency and seeking a plan to titrate off this medication. Patient WAS NOT receptive to this plan of treatment   Diabetes type 2 uncontrolled  -07/30/2013 HgA1C= 6.7 her indices within ADA guidelines)  -Continue SSI for control of CBG while hospitalized  -Well controlled overnight continue D5 normal saline 121ml/hr secondary to patient refusing to feeding per PEG   Diabetic gastroparesis  -Patient now has a PICC line placed  -Patient continue to have nausea and abdominal pain with Reglan IV 10 mg QID, and Phenergan IV 25 mg every 4hrs  -PT REFUSES TO START trickle feeds through PEG tube.  - PT REFUSES to ambulate while in the hospital other than to the bathroom  - Pt on home dose of narcotic medication (methadone 10 mg BID); all other Narcotic medication had  been DC'd; again spent considerable time explaining to patient that these medications were likely exacerbating her gastroparesis.  -acute abdominal series; shows possible ileus, (although patient claims to have had several BMs). most likely secondary to large chronic  narcotic use and Hx poorly controlled diabetes  -Lipase; WNL  -Patient refuses to participate in plan of care. Patient refuses to allow trickle feeds. Patient with drug-seeking behavior, continues to request additional narcotics even though patient is on her home dose of methadone, a large amount of Phenergan, and maximum dose of Reglan.  -Patient has no measurable signs of pain and is clearly seeking additional drug medication.   Intractable nausea and vomiting  -see diabetic gastroparesis   HLD  -Obtain lipid panel   HTN  -Currently within ADA guidelines continue current medication   Hyponatremia  -Still slightly hyponatremic, however has improved. Would improve with feedings however patient refuses.    Hypokalemia  -Slightly low however has improved would improve with feedings however patient refuses    Hypomagnesemia  -Resolved   Leukocytosis  -Most likely secondary to stress (narcotic withdrawal), should correct with resolution of N./V. and hydration -Negative left shift/bands, negative fever, therefore low suspicion for an infectious process  Narcotic withdrawal  -Patient signs and symptoms were of narcotic withdrawal have resolved with patient being placed back on home dose of methadone methadone 10 mg BID  -DC'd morphine and Dilaudid as this is most likely exacerbating patient's gastroparesis; NOTE patient continues to request more narcotics and nausea medication.  Hemoptysis -Patient refused to allow me to examine her mouth or throat -Recommended that patient remain and we would obtain sputum culture, blood culture, to ensure no infection (low suspicion), however patient refused and stated she wished to be discharged      Discharge Diagnoses:  Active Problems:   Gastroparesis   Intractable nausea and vomiting   Dyslipidemia   GERD (gastroesophageal reflux disease)   Hypertension   DM (diabetes mellitus),  type 2, uncontrolled   Hypokalemia   Dehydration    Hypomagnesemia   Hyponatremia   Leukocytosis   Nausea & vomiting   Narcotic withdrawal   Drug-seeking behavior   Hemoptysis   Discharge Condition: Stable  Diet recommendation: Resume tube feeds. Counseled patient to start with trickle feeding which she has refused here at the facility.  Filed Weights   08/01/13 1700 08/02/13 0414 08/03/13 0407  Weight: 69 kg (152 lb 1.9 oz) 69.4 kg (153 lb) 69.1 kg (152 lb 5.4 oz)    History of present illness:  43 y.o. BF PMHx uncontrolled DM, diabetic gastroparesis, s/p PEG tube, hypertension, hospitalizations every month for the above symptoms, comes intoday for persistent nausea , vomiting over the last 12 hours. On arrival to ED, she was tachycardic, hypertensive and in mild distress from abdominal pain. She also reports having watery diarrhea since this am. She denies fever , has chills, no sob, chest pain, orthopnea, pnd, syncope or pedal edema. Her labs revealed hyponatremia, leukocytosis. CT abd and plevis was done without contrast did not reveal any intraabdominal pathology. 07/31/2013 after unsuccessful attempt to place central venous line at bedside, IR was again contacted and agreed to squeeze patient to schedule in order to place a multilumen PICC line. Patient complains of abdominal pain, nausea not relieved with medication via PEG tube. 08/01/2012 patient in moderate to severe abdominal pain rated 8/10, requesting additional pain medication. Patient diaphoretic, tachypnea, tachycardic. 08/02/2013 patient sitting comfortably in bed and negative diaphoresis, negative SOB, negative tachycardic, negative tachypnea. Demanding additional narcotic medication. 08/03/2013 Patient is a frequent flyer who's stay during this hospitalization was complicated initially by narcotic withdrawal, until her scheduled methadone was restarted at which time signs and symptoms resolved. Even after resolution of withdrawal signs and symptoms patient continued to request more  narcotic and antiemetic medication. Patient continued to refuse to anticipate with physical therapy (i.e. ambulation around ward), which nurse's daily encourage patient to participate in, in order to stimulate bowel motility. Today patient c/o continued nausea, abdominal pain and new onset hemoptysis. Stated unsure when her hemoptysis started, most have been last night. Requested to examine patient's mouth and throat and patient refused. Patient again states her desire to be discharged to immediately   Procedures:  Acute abdominal series 08/01/2013  Abnormal but nonspecific bowel gas pattern with air-fluid levels in  the descending colon and in nondilated loops of left-sided small  bowel. Appearance could reflect ileus, diarrheal process, or less  likely orally obstruction. Right abdomen is gasless.   Placement PICC right arm 07/31/2013  Discharge Exam: Filed Vitals:   08/02/13 2003 08/02/13 2020 08/03/13 0407 08/03/13 1008  BP:  117/63 117/69   Pulse:  97 83   Temp:  100 F (37.8 C) 98.7 F (37.1 C)   TempSrc:  Oral Oral   Resp:  20 18   Height:      Weight:   69.1 kg (152 lb 5.4 oz)   SpO2: 98% 97% 100% 98%    General: A./O. x4, complaints of nausea/abdominal pain far out of proportion to her quantifiable symptoms (i.e. HR, BP, RR, temperature) Cardiovascular: Regular rhythm and rate, negative murmurs rubs or gallops Respiratory: Clear to auscultation bilateral  Discharge Instructions     Medication List    ASK your doctor about these medications       acetaminophen 160 MG/5ML solution  Commonly known as:  TYLENOL  Place 20.3 mLs (650 mg total) into feeding tube 3 (three) times daily.  amLODipine 10 MG tablet  Commonly known as:  NORVASC  Place 1 tablet (10 mg total) into feeding tube daily.     atorvastatin 40 MG tablet  Commonly known as:  LIPITOR  Place 1 tablet (40 mg total) into feeding tube daily at 6 PM.     busPIRone 5 MG tablet  Commonly known as:   BUSPAR  Place 5 mg into feeding tube 3 (three) times daily.     cefpodoxime 200 MG tablet  Commonly known as:  VANTIN  Place 200 mg into feeding tube 2 (two) times daily.     enoxaparin 40 MG/0.4ML injection  Commonly known as:  LOVENOX  Inject 0.4 mLs (40 mg total) into the skin daily.     feeding supplement (VITAL AF 1.2 CAL) Liqd  Place 237 mLs into feeding tube every 3 (three) hours while awake.     Fluticasone-Salmeterol 250-50 MCG/DOSE Aepb  Commonly known as:  ADVAIR  Inhale 1 puff into the lungs 2 (two) times daily.     free water Soln  Place 60 mLs into feeding tube every 4 (four) hours.     gabapentin 600 MG tablet  Commonly known as:  NEURONTIN  Place 600 mg into feeding tube 3 (three) times daily.     HYDROmorphone 1 MG/ML Soln injection  Commonly known as:  DILAUDID  Inject 0.5-1 mLs (0.5-1 mg total) into the vein every 3 (three) hours as needed for severe pain.     insulin aspart 100 UNIT/ML injection  Commonly known as:  novoLOG  Inject 0-9 Units into the skin every 4 (four) hours.     insulin glargine 100 UNIT/ML injection  Commonly known as:  LANTUS  Inject 15 Units into the skin at bedtime.     lisinopril 10 MG tablet  Commonly known as:  PRINIVIL,ZESTRIL  Place 10 mg into feeding tube daily.     LORazepam 2 MG/ML injection  Commonly known as:  ATIVAN  Inject 0.25 mLs (0.5 mg total) into the vein every 6 (six) hours as needed for anxiety (nausea).     magnesium oxide 400 MG tablet  Commonly known as:  MAG-OX  Place 400 mg into feeding tube daily.     methadone 5 MG/5ML solution  Commonly known as:  DOLOPHINE  Place 10 mg into feeding tube 2 (two) times daily as needed for severe pain.     metoCLOPramide 5 MG/5ML solution  Commonly known as:  REGLAN  Take 10 mLs (10 mg total) by mouth 3 (three) times daily before meals.     mirtazapine 15 MG tablet  Commonly known as:  REMERON  Place 15 mg into feeding tube at bedtime.     multivitamin  Liqd  Place 5 mLs into feeding tube daily.     pantoprazole sodium 40 mg/20 mL Pack  Commonly known as:  PROTONIX  Place 40 mg into feeding tube daily.     promethazine 25 MG suppository  Commonly known as:  PHENERGAN  Place 25 mg rectally every 6 (six) hours as needed for nausea.     sodium chloride 0.9 % infusion  Inject 100 mLs into the vein continuous.     sucralfate 1 GM/10ML suspension  Commonly known as:  CARAFATE  Place 10 mLs (1 g total) into feeding tube 4 (four) times daily -  with meals and at bedtime.     torsemide 20 MG tablet  Commonly known as:  DEMADEX  Place 20 mg into feeding tube  daily.     zolpidem 10 MG tablet  Commonly known as:  AMBIEN  Place 10 mg into feeding tube at bedtime.       Allergies  Allergen Reactions  . Cefadroxil Other (See Comments)    Not sure of effects-listed as allergy on list from home  . Darvocet [Propoxyphene N-Acetaminophen] Other (See Comments)    UNKNOWN  . Compazine Hives    Tolerates promethazine  . Nsaids Hives  . Penicillins Hives  . Zofran [Ondansetron] Hives      The results of significant diagnostics from this hospitalization (including imaging, microbiology, ancillary and laboratory) are listed below for reference.    Significant Diagnostic Studies: Ct Abdomen Pelvis W Contrast  07/30/2013   CLINICAL DATA:  Abdominal pain, nausea, vomiting, diarrhea, history of hysterectomy, PEG tube, cholecystectomy, partial colon resection, diverticulitis  EXAM: CT ABDOMEN AND PELVIS WITH CONTRAST  TECHNIQUE: Multidetector CT imaging of the abdomen and pelvis was performed using the standard protocol following bolus administration of intravenous contrast.  CONTRAST:  167mL OMNIPAQUE IOHEXOL 300 MG/ML  SOLN  COMPARISON:  05/27/2013  FINDINGS: Visualized portions of lung bases clear.  Study is effectively performed without contrast, as there was IV infiltration of the injected dose. The patient's referring physician and nurse  were notified. There is also no oral contrast.  Allowing for the absence of contrast, there is grossly normal liver, spleen, and pancreas. There is a small hiatal hernia. The patient is status post cholecystectomy. Hip common bile duct is mildly prominent. This is likely due to prior cholecystectomy. There is a percutaneous gastrostomy tube with tip in the stomach. Adrenal glands are normal. Kidneys are normal.  There is a nonobstructive bowel gas pattern. There is left upper quadrant colon anastomosis. There is no free air or fluid in the abdomen or pelvis. There is no evidence of focal inflammatory process.  Pelvic organs appear to be absent. The bladder is distended. There are no acute musculoskeletal findings.  IMPRESSION: No acute abnormality. Study is effectively performed without contrast given intravenous infiltration of the contrast does as described above.   Electronically Signed   By: Skipper Cliche M.D.   On: 07/30/2013 14:11   Ir Fluoro Guide Cv Line Right  07/31/2013   CLINICAL DATA:  43 year old needs IV access.  EXAM: POWER INJECTABLE PICC LINE PLACEMENT WITH ULTRASOUND AND FLUOROSCOPIC GUIDANCE  FLUOROSCOPY TIME:  6 seconds  PROCEDURE: The patient was advised of the possible risks and complications and agreed to undergo the procedure. The patient was then brought to the angiographic suite for the procedure.  The right arm was prepped with chlorhexidine, draped in the usual sterile fashion using maximum barrier technique (cap and mask, sterile gown, sterile gloves, large sterile sheet, hand hygiene and cutaneous antisepsis) and infiltrated locally with 1% Lidocaine.  Ultrasound demonstrated patency of the right brachial vein, and this was documented with an image. Under real-time ultrasound guidance, this vein was accessed with a 21 gauge micropuncture needle and image documentation was performed. A 0.018 wire was introduced into the vein. A peel-away sheath was placed. A 5 French dual lumen  power injectable PICC was advanced to the lower SVC. Fluoroscopy during the procedure and fluoro spot radiograph confirms appropriate catheter position. Catheter was sutured to the skin. The catheter was flushed and covered with a sterile dressing.  Complications: None  IMPRESSION: Successful right arm power injectable PICC line placement with ultrasound and fluoroscopic guidance. The catheter is ready for use.   Electronically Signed  By: Markus Daft M.D.   On: 07/31/2013 16:32   Ir US Guide Vasc Access Right  07/31/2013   CLINICAL DATA:  43 year old needs IV access.  EXAM: POWER INJECTABLE PICC LINE PLACEMENT WITH ULTRASOUND AND FLUOROSCOPIC GUIDANCE  FLUOROSCOPY TIME:  6 seconds  PROCEDURE: The patient was advised of the possible risks and complications and agreed to undergo the procedure. The patient was then brought to the angiographic suite for the procedure.  The right arm was prepped with chlorhexidine, draped in the usual sterile fashion using maximum barrier technique (cap and mask, sterile gown, sterile gloves, large sterile sheet, hand hygiene and cutaneous antisepsis) and infiltrated locally with 1% Lidocaine.  Ultrasound demonstrated patency of the right brachial vein, and this was documented with an image. Under real-time ultrasound guidance, this vein was accessed with a 21 gauge micropuncture needle and image documentation was performed. A 0.018 wire was introduced into the vein. A peel-away sheath was placed. A 5 French dual lumen power injectable PICC was advanced to the lower SVC. Fluoroscopy during the procedure and fluoro spot radiograph confirms appropriate catheter position. Catheter was sutured to the skin. The catheter was flushed and covered with a sterile dressing.  Complications: None  IMPRESSION: Successful right arm power injectable PICC line placement with ultrasound and fluoroscopic guidance. The catheter is ready for use.   Electronically Signed   By: Markus Daft M.D.   On:  07/31/2013 16:32   Dg Chest Port 1 View  07/30/2013   CLINICAL DATA:  Vomiting, evaluate for aspiration  EXAM: PORTABLE CHEST - 1 VIEW  COMPARISON:  05/27/2013  FINDINGS: The heart size and mediastinal contours are within normal limits. Both lungs are clear. The visualized skeletal structures are unremarkable.  IMPRESSION: No active disease.   Electronically Signed   By: Skipper Cliche M.D.   On: 07/30/2013 18:19   Dg Abd Acute W/chest  08/01/2013   CLINICAL DATA:  Abdominal pain.  Nausea and vomiting.  EXAM: ACUTE ABDOMEN SERIES (ABDOMEN 2 VIEW & CHEST 1 VIEW)  COMPARISON:  Multiple exams, including 07/30/2013  FINDINGS: Right PICC line tip: SVC. Cardiac and mediastinal margins appear normal. The lungs appear clear.  No free intraperitoneal gas observed beneath the hemidiaphragms. There are some air-fluid levels in the proximal descending colon is and in nondilated left-sided loops of small bowel. The right abdomen is gasless. Peg tube noted.  Prior low lower lumbar spine scratch have postoperative findings in the lower lumbar spine.  IMPRESSION: Abnormal but nonspecific bowel gas pattern with air-fluid levels in the descending colon and in nondilated loops of left-sided small bowel. Appearance could reflect ileus, diarrheal process, or less likely orally obstruction. Right abdomen is gasless.   Electronically Signed   By: Sherryl Barters M.D.   On: 08/01/2013 16:27    Microbiology: No results found for this or any previous visit (from the past 240 hour(s)).   Labs: Basic Metabolic Panel:  Recent Labs Lab 07/30/13 1234 07/31/13 0425 07/31/13 0900 08/01/13 0500 08/02/13 0606 08/02/13 1755 08/03/13 0455  NA 129* 128*  --  135* 136*  --  133*  K 4.0 3.7  --  3.0* 3.0* 3.2* 3.4*  CL 85* 89*  --  99 101  --  100  CO2 21 21  --  25 24  --  20  GLUCOSE 342* 336*  --  76 142*  --  78  BUN 21 31*  --  34* 15  --  9  CREATININE 0.76  0.98  --  1.30* 0.81  --  0.73  CALCIUM 9.9 9.0  --  8.5  8.0*  --  8.2*  MG  --   --  1.6 1.6 1.3* 1.8 2.5   Liver Function Tests:  Recent Labs Lab 07/30/13 1234 07/31/13 0900 08/01/13 0500 08/02/13 0606 08/03/13 0455  AST 16 18 15 14 14   ALT 9 8 7 8 7   ALKPHOS 127* 93 76 67 74  BILITOT 0.3 <0.2* <0.2* 0.2* 0.2*  PROT 8.4* 6.8 6.1 5.7* 5.8*  ALBUMIN 3.3* 2.8* 2.6* 2.4* 2.3*    Recent Labs Lab 07/30/13 1234 08/01/13 1705 08/01/13 1725  LIPASE 14 13  --   AMYLASE  --   --  27   No results found for this basename: AMMONIA,  in the last 168 hours CBC:  Recent Labs Lab 07/30/13 1234 07/31/13 0900 08/01/13 0500 08/02/13 0606 08/03/13 0455  WBC 19.5* 16.4* 11.0* 11.0* 12.0*  NEUTROABS 16.6* 13.5* 7.0 8.0* 8.7*  HGB 13.8 12.7 10.7* 9.8* 9.4*  HCT 39.2 35.4* 32.2* 29.0* 28.7*  MCV 86.9 85.5 88.5 88.1 89.4  PLT 341 PLATELET CLUMPS NOTED ON SMEAR, COUNT APPEARS ADEQUATE 227 191 152   Cardiac Enzymes:  Recent Labs Lab 08/01/13 1705  CKTOTAL 139  CKMB 1.0   BNP: BNP (last 3 results) No results found for this basename: PROBNP,  in the last 8760 hours CBG:  Recent Labs Lab 08/02/13 1614 08/02/13 2016 08/02/13 2351 08/03/13 0406 08/03/13 0752  GLUCAP 128* 148* 162* 88 101*       Signed:  Dia Crawford, MD [facility] [phone] pager

## 2013-08-13 ENCOUNTER — Inpatient Hospital Stay (HOSPITAL_COMMUNITY): Payer: Medicare Other

## 2013-08-13 ENCOUNTER — Encounter (HOSPITAL_COMMUNITY): Payer: Self-pay | Admitting: Emergency Medicine

## 2013-08-13 ENCOUNTER — Inpatient Hospital Stay (HOSPITAL_COMMUNITY)
Admission: EM | Admit: 2013-08-13 | Discharge: 2013-08-19 | DRG: 073 | Disposition: A | Discharge status: Home / Self Care | Payer: Medicare Other | Attending: Internal Medicine | Admitting: Internal Medicine

## 2013-08-13 DIAGNOSIS — I10 Essential (primary) hypertension: Secondary | ICD-10-CM

## 2013-08-13 DIAGNOSIS — I2510 Atherosclerotic heart disease of native coronary artery without angina pectoris: Secondary | ICD-10-CM | POA: Diagnosis present

## 2013-08-13 DIAGNOSIS — F1920 Other psychoactive substance dependence, uncomplicated: Secondary | ICD-10-CM | POA: Diagnosis present

## 2013-08-13 DIAGNOSIS — R1013 Epigastric pain: Secondary | ICD-10-CM | POA: Diagnosis present

## 2013-08-13 DIAGNOSIS — Z931 Gastrostomy status: Secondary | ICD-10-CM

## 2013-08-13 DIAGNOSIS — K861 Other chronic pancreatitis: Secondary | ICD-10-CM | POA: Diagnosis present

## 2013-08-13 DIAGNOSIS — E1143 Type 2 diabetes mellitus with diabetic autonomic (poly)neuropathy: Secondary | ICD-10-CM | POA: Diagnosis present

## 2013-08-13 DIAGNOSIS — E1140 Type 2 diabetes mellitus with diabetic neuropathy, unspecified: Secondary | ICD-10-CM

## 2013-08-13 DIAGNOSIS — Z87891 Personal history of nicotine dependence: Secondary | ICD-10-CM

## 2013-08-13 DIAGNOSIS — Z765 Malingerer [conscious simulation]: Secondary | ICD-10-CM

## 2013-08-13 DIAGNOSIS — Z880 Allergy status to penicillin: Secondary | ICD-10-CM

## 2013-08-13 DIAGNOSIS — K219 Gastro-esophageal reflux disease without esophagitis: Secondary | ICD-10-CM

## 2013-08-13 DIAGNOSIS — K3184 Gastroparesis: Secondary | ICD-10-CM

## 2013-08-13 DIAGNOSIS — R1110 Vomiting, unspecified: Secondary | ICD-10-CM

## 2013-08-13 DIAGNOSIS — Z79899 Other long term (current) drug therapy: Secondary | ICD-10-CM

## 2013-08-13 DIAGNOSIS — E1165 Type 2 diabetes mellitus with hyperglycemia: Secondary | ICD-10-CM

## 2013-08-13 DIAGNOSIS — Z9049 Acquired absence of other specified parts of digestive tract: Secondary | ICD-10-CM

## 2013-08-13 DIAGNOSIS — IMO0002 Reserved for concepts with insufficient information to code with codable children: Secondary | ICD-10-CM

## 2013-08-13 DIAGNOSIS — E785 Hyperlipidemia, unspecified: Secondary | ICD-10-CM

## 2013-08-13 DIAGNOSIS — E43 Unspecified severe protein-calorie malnutrition: Secondary | ICD-10-CM

## 2013-08-13 DIAGNOSIS — R109 Unspecified abdominal pain: Secondary | ICD-10-CM

## 2013-08-13 DIAGNOSIS — R042 Hemoptysis: Secondary | ICD-10-CM

## 2013-08-13 DIAGNOSIS — IMO0001 Reserved for inherently not codable concepts without codable children: Secondary | ICD-10-CM

## 2013-08-13 DIAGNOSIS — Z794 Long term (current) use of insulin: Secondary | ICD-10-CM

## 2013-08-13 DIAGNOSIS — K920 Hematemesis: Secondary | ICD-10-CM

## 2013-08-13 DIAGNOSIS — R112 Nausea with vomiting, unspecified: Secondary | ICD-10-CM

## 2013-08-13 DIAGNOSIS — R1115 Cyclical vomiting syndrome unrelated to migraine: Secondary | ICD-10-CM

## 2013-08-13 DIAGNOSIS — I252 Old myocardial infarction: Secondary | ICD-10-CM

## 2013-08-13 DIAGNOSIS — R569 Unspecified convulsions: Secondary | ICD-10-CM | POA: Diagnosis present

## 2013-08-13 DIAGNOSIS — E1142 Type 2 diabetes mellitus with diabetic polyneuropathy: Secondary | ICD-10-CM | POA: Diagnosis present

## 2013-08-13 DIAGNOSIS — E1149 Type 2 diabetes mellitus with other diabetic neurological complication: Principal | ICD-10-CM | POA: Diagnosis present

## 2013-08-13 DIAGNOSIS — Z431 Encounter for attention to gastrostomy: Secondary | ICD-10-CM

## 2013-08-13 DIAGNOSIS — D72829 Elevated white blood cell count, unspecified: Secondary | ICD-10-CM

## 2013-08-13 DIAGNOSIS — J4489 Other specified chronic obstructive pulmonary disease: Secondary | ICD-10-CM | POA: Diagnosis present

## 2013-08-13 DIAGNOSIS — J449 Chronic obstructive pulmonary disease, unspecified: Secondary | ICD-10-CM | POA: Diagnosis present

## 2013-08-13 DIAGNOSIS — E876 Hypokalemia: Secondary | ICD-10-CM

## 2013-08-13 LAB — CBC WITH DIFFERENTIAL/PLATELET
Basophils Absolute: 0 10*3/uL (ref 0.0–0.1)
Basophils Relative: 0 % (ref 0–1)
Eosinophils Absolute: 0 10*3/uL (ref 0.0–0.7)
Eosinophils Relative: 0 % (ref 0–5)
HEMATOCRIT: 39.5 % (ref 36.0–46.0)
HEMOGLOBIN: 14 g/dL (ref 12.0–15.0)
LYMPHS ABS: 2.9 10*3/uL (ref 0.7–4.0)
Lymphocytes Relative: 23 % (ref 12–46)
MCH: 30.2 pg (ref 26.0–34.0)
MCHC: 35.4 g/dL (ref 30.0–36.0)
MCV: 85.3 fL (ref 78.0–100.0)
MONOS PCT: 4 % (ref 3–12)
Monocytes Absolute: 0.5 10*3/uL (ref 0.1–1.0)
NEUTROS ABS: 9.1 10*3/uL — AB (ref 1.7–7.7)
Neutrophils Relative %: 73 % (ref 43–77)
Platelets: 353 10*3/uL (ref 150–400)
RBC: 4.63 MIL/uL (ref 3.87–5.11)
RDW: 13.9 % (ref 11.5–15.5)
WBC: 12.6 10*3/uL — ABNORMAL HIGH (ref 4.0–10.5)

## 2013-08-13 LAB — COMPREHENSIVE METABOLIC PANEL
ALBUMIN: 3.6 g/dL (ref 3.5–5.2)
ALK PHOS: 120 U/L — AB (ref 39–117)
ALT: 16 U/L (ref 0–35)
AST: 21 U/L (ref 0–37)
BILIRUBIN TOTAL: 0.2 mg/dL — AB (ref 0.3–1.2)
BUN: 18 mg/dL (ref 6–23)
CHLORIDE: 93 meq/L — AB (ref 96–112)
CO2: 25 mEq/L (ref 19–32)
Calcium: 9.5 mg/dL (ref 8.4–10.5)
Creatinine, Ser: 0.62 mg/dL (ref 0.50–1.10)
GFR calc Af Amer: >90 mL/min (ref >90)
GLUCOSE: 267 mg/dL — AB (ref 70–99)
Potassium: 4 mEq/L (ref 3.7–5.3)
Sodium: 134 mEq/L — ABNORMAL LOW (ref 137–147)
Total Protein: 8.6 g/dL — ABNORMAL HIGH (ref 6.0–8.3)

## 2013-08-13 LAB — URINALYSIS W MICROSCOPIC + REFLEX CULTURE
BILIRUBIN URINE: NEGATIVE
Glucose, UA: >1000 mg/dL — AB
KETONES UR: NEGATIVE mg/dL
Leukocytes, UA: NEGATIVE
Nitrite: NEGATIVE
PH: 6 (ref 5.0–8.0)
Protein, ur: >300 mg/dL — AB
Specific Gravity, Urine: 1.042 — ABNORMAL HIGH (ref 1.005–1.030)
Urobilinogen, UA: 0.2 mg/dL (ref 0.0–1.0)

## 2013-08-13 LAB — GLUCOSE, CAPILLARY
GLUCOSE-CAPILLARY: 162 mg/dL — AB (ref 70–99)
GLUCOSE-CAPILLARY: 183 mg/dL — AB (ref 70–99)

## 2013-08-13 LAB — MAGNESIUM: Magnesium: 1.5 mg/dL (ref 1.5–2.5)

## 2013-08-13 LAB — PHOSPHORUS: Phosphorus: 2.8 mg/dL (ref 2.3–4.6)

## 2013-08-13 LAB — LIPASE, BLOOD: LIPASE: 16 U/L (ref 11–59)

## 2013-08-13 LAB — HEMOGLOBIN A1C
Hgb A1c MFr Bld: 7.1 % — ABNORMAL HIGH (ref <5.7)
Mean Plasma Glucose: 157 mg/dL — ABNORMAL HIGH (ref <117)

## 2013-08-13 MED ORDER — INSULIN ASPART 100 UNIT/ML ~~LOC~~ SOLN
0.0000 [IU] | Freq: Every day | SUBCUTANEOUS | Status: DC
  Administered 2013-08-17: 2 [IU] via SUBCUTANEOUS
  Administered 2013-08-18: 3 [IU] via SUBCUTANEOUS

## 2013-08-13 MED ORDER — HYDROMORPHONE HCL PF 1 MG/ML IJ SOLN: 1.0000 mg | INTRAMUSCULAR | Status: DC | PRN

## 2013-08-13 MED ORDER — SODIUM CHLORIDE 0.9 % IV SOLN: INTRAVENOUS | Status: AC

## 2013-08-13 MED ORDER — GABAPENTIN 250 MG/5ML PO SOLN
600.0000 mg | Freq: Three times a day (TID) | ORAL | Status: DC
  Administered 2013-08-14 – 2013-08-19 (×14): 600 mg
  Filled 2013-08-13 (×24): qty 12

## 2013-08-13 MED ORDER — METOCLOPRAMIDE HCL 5 MG/5ML PO SOLN
10.0000 mg | Freq: Three times a day (TID) | ORAL | Status: DC
  Administered 2013-08-14: 10 mg via ORAL
  Filled 2013-08-13 (×4): qty 10

## 2013-08-13 MED ORDER — SUCRALFATE 1 GM/10ML PO SUSP
1.0000 g | Freq: Three times a day (TID) | ORAL | Status: DC
  Administered 2013-08-14 – 2013-08-18 (×17): 1 g
  Filled 2013-08-13 (×26): qty 10

## 2013-08-13 MED ORDER — HYDROMORPHONE HCL PF 1 MG/ML IJ SOLN
0.5000 mg | INTRAMUSCULAR | Status: DC | PRN
  Administered 2013-08-13: 0.5 mg via INTRAVENOUS
  Filled 2013-08-13: qty 1

## 2013-08-13 MED ORDER — ACETAMINOPHEN 160 MG/5ML PO SOLN
650.0000 mg | Freq: Three times a day (TID) | ORAL | Status: DC
  Administered 2013-08-14 – 2013-08-18 (×13): 650 mg
  Filled 2013-08-13 (×13): qty 20.3

## 2013-08-13 MED ORDER — PROMETHAZINE HCL 25 MG/ML IJ SOLN
12.5000 mg | Freq: Four times a day (QID) | INTRAMUSCULAR | Status: DC | PRN
  Administered 2013-08-13 – 2013-08-15 (×6): 25 mg via INTRAVENOUS
  Administered 2013-08-16: 12.5 mg via INTRAVENOUS
  Administered 2013-08-16 (×2): 25 mg via INTRAVENOUS
  Administered 2013-08-16 – 2013-08-18 (×5): 12.5 mg via INTRAVENOUS
  Administered 2013-08-18 (×2): 25 mg via INTRAVENOUS
  Administered 2013-08-18: 12.5 mg via INTRAVENOUS
  Filled 2013-08-13 (×18): qty 1

## 2013-08-13 MED ORDER — MAGNESIUM OXIDE 400 MG PO TABS: 400.0000 mg | ORAL_TABLET | Freq: Every day | ORAL | Status: DC

## 2013-08-13 MED ORDER — MAGNESIUM OXIDE 400 (241.3 MG) MG PO TABS
400.0000 mg | ORAL_TABLET | Freq: Every day | ORAL | Status: DC
  Administered 2013-08-15 – 2013-08-18 (×4): 400 mg
  Filled 2013-08-13 (×7): qty 1

## 2013-08-13 MED ORDER — DEXTROSE-NACL 5-0.9 % IV SOLN
INTRAVENOUS | Status: DC
  Administered 2013-08-13 – 2013-08-17 (×7): via INTRAVENOUS
  Administered 2013-08-17: 100 mL/h via INTRAVENOUS
  Administered 2013-08-17: 12:00:00 via INTRAVENOUS

## 2013-08-13 MED ORDER — LISINOPRIL 10 MG PO TABS
10.0000 mg | ORAL_TABLET | Freq: Every day | ORAL | Status: DC
  Administered 2013-08-14 – 2013-08-18 (×5): 10 mg
  Filled 2013-08-13 (×7): qty 1

## 2013-08-13 MED ORDER — VITAL AF 1.2 CAL PO LIQD
237.0000 mL | ORAL | Status: DC
  Filled 2013-08-13 (×20): qty 237

## 2013-08-13 MED ORDER — INSULIN ASPART 100 UNIT/ML ~~LOC~~ SOLN
0.0000 [IU] | Freq: Three times a day (TID) | SUBCUTANEOUS | Status: DC
  Administered 2013-08-14 – 2013-08-15 (×2): 2 [IU] via SUBCUTANEOUS
  Administered 2013-08-15 (×2): 3 [IU] via SUBCUTANEOUS
  Administered 2013-08-16: 2 [IU] via SUBCUTANEOUS
  Administered 2013-08-16 – 2013-08-17 (×3): 3 [IU] via SUBCUTANEOUS
  Administered 2013-08-18: 2 [IU] via SUBCUTANEOUS
  Administered 2013-08-18 (×2): 3 [IU] via SUBCUTANEOUS
  Administered 2013-08-19: 8 [IU] via SUBCUTANEOUS

## 2013-08-13 MED ORDER — PANTOPRAZOLE SODIUM 40 MG PO PACK
40.0000 mg | PACK | Freq: Two times a day (BID) | ORAL | Status: DC
  Filled 2013-08-13 (×3): qty 20

## 2013-08-13 MED ORDER — MOMETASONE FURO-FORMOTEROL FUM 100-5 MCG/ACT IN AERO
2.0000 | INHALATION_SPRAY | Freq: Two times a day (BID) | RESPIRATORY_TRACT | Status: DC
  Administered 2013-08-13 – 2013-08-18 (×11): 2 via RESPIRATORY_TRACT
  Filled 2013-08-13: qty 8.8

## 2013-08-13 MED ORDER — OXYCODONE HCL 5 MG PO TABS
5.0000 mg | ORAL_TABLET | ORAL | Status: DC | PRN
  Administered 2013-08-14 – 2013-08-19 (×13): 5 mg via ORAL
  Filled 2013-08-13 (×14): qty 1

## 2013-08-13 MED ORDER — HYDROMORPHONE HCL PF 1 MG/ML IJ SOLN
1.0000 mg | INTRAMUSCULAR | Status: AC
  Administered 2013-08-13: 1 mg via INTRAVENOUS
  Filled 2013-08-13: qty 1

## 2013-08-13 MED ORDER — SODIUM CHLORIDE 0.9 % IV BOLUS (SEPSIS)
1000.0000 mL | INTRAVENOUS | Status: AC
  Administered 2013-08-13: 1000 mL via INTRAVENOUS

## 2013-08-13 MED ORDER — BUSPIRONE HCL 5 MG PO TABS
5.0000 mg | ORAL_TABLET | Freq: Three times a day (TID) | ORAL | Status: DC
  Administered 2013-08-14 – 2013-08-18 (×13): 5 mg
  Filled 2013-08-13 (×19): qty 1

## 2013-08-13 MED ORDER — MIRTAZAPINE 15 MG PO TABS
15.0000 mg | ORAL_TABLET | Freq: Every day | ORAL | Status: DC
  Administered 2013-08-14 – 2013-08-18 (×5): 15 mg
  Filled 2013-08-13 (×7): qty 1

## 2013-08-13 MED ORDER — ATORVASTATIN CALCIUM 40 MG PO TABS
40.0000 mg | ORAL_TABLET | Freq: Every day | ORAL | Status: DC
  Administered 2013-08-14 – 2013-08-18 (×4): 40 mg
  Filled 2013-08-13 (×7): qty 1

## 2013-08-13 MED ORDER — HYDROMORPHONE HCL PF 1 MG/ML IJ SOLN
1.0000 mg | INTRAMUSCULAR | Status: DC | PRN
  Administered 2013-08-13 – 2013-08-14 (×4): 1 mg via INTRAMUSCULAR
  Filled 2013-08-13 (×6): qty 1

## 2013-08-13 MED ORDER — SODIUM CHLORIDE 0.9 % IV BOLUS (SEPSIS)
1000.0000 mL | Freq: Once | INTRAVENOUS | Status: AC
  Administered 2013-08-13: 1000 mL via INTRAVENOUS

## 2013-08-13 MED ORDER — ADULT MULTIVITAMIN LIQUID CH
5.0000 mL | Freq: Every day | ORAL | Status: DC
  Administered 2013-08-15 – 2013-08-18 (×4): 5 mL
  Filled 2013-08-13 (×7): qty 5

## 2013-08-13 MED ORDER — GABAPENTIN 600 MG PO TABS: 600.0000 mg | ORAL_TABLET | Freq: Three times a day (TID) | ORAL | Status: DC

## 2013-08-13 MED ORDER — INSULIN GLARGINE 100 UNIT/ML ~~LOC~~ SOLN
7.0000 [IU] | Freq: Every day | SUBCUTANEOUS | Status: DC
  Administered 2013-08-13 – 2013-08-18 (×6): 7 [IU] via SUBCUTANEOUS
  Filled 2013-08-13 (×6): qty 0.07

## 2013-08-13 MED ORDER — PROMETHAZINE HCL 25 MG RE SUPP
12.5000 mg | Freq: Four times a day (QID) | RECTAL | Status: DC | PRN
  Administered 2013-08-13 – 2013-08-14 (×2): 12.5 mg via RECTAL
  Filled 2013-08-13 (×2): qty 1

## 2013-08-13 MED ORDER — HEPARIN SODIUM (PORCINE) 5000 UNIT/ML IJ SOLN
5000.0000 [IU] | Freq: Three times a day (TID) | INTRAMUSCULAR | Status: DC
  Administered 2013-08-13 – 2013-08-19 (×17): 5000 [IU] via SUBCUTANEOUS
  Filled 2013-08-13 (×20): qty 1

## 2013-08-13 MED ORDER — LABETALOL HCL 100 MG PO TABS
100.0000 mg | ORAL_TABLET | Freq: Two times a day (BID) | ORAL | Status: DC
  Administered 2013-08-14 – 2013-08-17 (×6): 100 mg
  Filled 2013-08-13 (×9): qty 1

## 2013-08-13 MED ORDER — ZOLPIDEM TARTRATE 10 MG PO TABS
10.0000 mg | ORAL_TABLET | Freq: Every day | ORAL | Status: DC
  Administered 2013-08-14 – 2013-08-18 (×5): 10 mg
  Filled 2013-08-13 (×5): qty 1

## 2013-08-13 MED ORDER — PROMETHAZINE HCL 25 MG/ML IJ SOLN
25.0000 mg | Freq: Once | INTRAMUSCULAR | Status: AC
  Administered 2013-08-13: 25 mg via INTRAVENOUS
  Filled 2013-08-13: qty 1

## 2013-08-13 NOTE — ED Provider Notes (Signed)
CSN: [id number]     Arrival date & time 08/13/13  [id number] History   First MD Initiated Contact with Patient 08/13/13 [phone]     Chief Complaint  Patient presents with  . Abdominal Pain  . Emesis   (Consider location/radiation/quality/duration/timing/severity/associated sxs/prior Treatment) Patient is a 43 y.o. female presenting with abdominal pain and vomiting. The history is provided by the patient.  Abdominal Pain Pain location:  Epigastric Pain quality: sharp   Pain radiates to:  Back Pain severity:  Severe Onset quality:  Sudden Duration:  12 hours Timing:  Constant Progression:  Worsening Chronicity:  Chronic Context comment:  At rest Relieved by:  Nothing Worsened by:  Nothing tried Ineffective treatments:  None tried Associated symptoms: nausea and vomiting   Associated symptoms: no chest pain, no cough, no diarrhea, no dysuria, no fatigue, no fever, no hematuria and no shortness of breath   Emesis Associated symptoms: abdominal pain   Associated symptoms: no diarrhea and no headaches     Past Medical History  Diagnosis Date  . Hypertension   . Diabetes mellitus   . Gastroparesis   . Asthma   . GERD (gastroesophageal reflux disease)   . Coronary artery disease   . h/o seizure   . Neuropathy   . Chronic pancreatitis   . Dyslipidemia   . MI (myocardial infarction)   . COPD (chronic obstructive pulmonary disease)   . Seizures    Past Surgical History  Procedure Laterality Date  . Abdominal hysterectomy    . Cholecystectomy    . Peg tube x 4      feeding jejunostomies with PEG tubes  . Cesarean section    . Colon resection due to diverticulitis    . Back surgery    . Esophagogastroduodenoscopy N/A 12/27/2012    Procedure: ESOPHAGOGASTRODUODENOSCOPY (EGD);  Surgeon: Missy Sabins, MD;  Location: [facility];  Service: Endoscopy;  Laterality: N/A;  . Esophagogastroduodenoscopy (egd) with esophageal dilation N/A 02/19/2013    Procedure: ESOPHAGOGASTRODUODENOSCOPY  (EGD) WITH ESOPHAGEAL DILATION;  Surgeon: Wonda Horner, MD;  Location: [facility];  Service: Endoscopy;  Laterality: N/A;  . Balloon dilation N/A 02/19/2013    Procedure: BALLOON DILATION;  Surgeon: Wonda Horner, MD;  Location: [facility];  Service: Endoscopy;  Laterality: N/A;   Family History  Problem Relation Age of Onset  . Cancer Mother    History  Substance Use Topics  . Smoking status: Former Smoker -- 0.25 packs/day for 2 years    Types: Cigarettes  . Smokeless tobacco: Never Used  . Alcohol Use: No   OB History   Grav Para Term Preterm Abortions TAB SAB Ect Mult Living                 Review of Systems  Constitutional: Negative for fever and fatigue.  HENT: Negative for congestion and drooling.   Eyes: Negative for pain.  Respiratory: Negative for cough and shortness of breath.   Cardiovascular: Negative for chest pain.  Gastrointestinal: Positive for nausea, vomiting and abdominal pain. Negative for diarrhea.  Genitourinary: Negative for dysuria and hematuria.       Streaks of blood in emesis.   Musculoskeletal: Negative for back pain, gait problem and neck pain.  Skin: Negative for color change.  Neurological: Negative for dizziness and headaches.  Hematological: Negative for adenopathy.  Psychiatric/Behavioral: Negative for behavioral problems.  All other systems reviewed and are negative.    Allergies  Cefadroxil; Darvocet; Compazine; Nsaids; Penicillins; and Zofran  Home  Medications   Current Outpatient Rx  Name  Route  Sig  Dispense  Refill  . acetaminophen (TYLENOL) 160 MG/5ML solution   Per Tube   Place 20.3 mLs (650 mg total) into feeding tube 3 (three) times daily.   120 mL   0   . atorvastatin (LIPITOR) 40 MG tablet   Per Tube   Place 1 tablet (40 mg total) into feeding tube daily at 6 PM.   30 tablet   0   . busPIRone (BUSPAR) 5 MG tablet   Per Tube   Place 5 mg into feeding tube 3 (three) times daily.         .  Fluticasone-Salmeterol (ADVAIR) 250-50 MCG/DOSE AEPB   Inhalation   Inhale 1 puff into the lungs 2 (two) times daily.   60 each   0   . gabapentin (NEURONTIN) 600 MG tablet   Per Tube   Place 600 mg into feeding tube 3 (three) times daily.          . insulin aspart (NOVOLOG) 100 UNIT/ML injection   Subcutaneous   Inject 0-9 Units into the skin every 4 (four) hours.   1 vial   12   . insulin glargine (LANTUS) 100 UNIT/ML injection   Subcutaneous   Inject 15 Units into the skin at bedtime.         Marland Kitchen labetalol (NORMODYNE) 100 MG tablet   Per Tube   Place 1 tablet (100 mg total) into feeding tube 2 (two) times daily.   60 tablet   0   . lisinopril (PRINIVIL,ZESTRIL) 10 MG tablet   Per Tube   Place 10 mg into feeding tube daily.          . magnesium oxide (MAG-OX) 400 MG tablet   Per Tube   Place 400 mg into feeding tube daily.         . metoCLOPramide (REGLAN) 5 MG/5ML solution   Oral   Take 10 mLs (10 mg total) by mouth 3 (three) times daily before meals.   120 mL   0   . mirtazapine (REMERON) 15 MG tablet   Per Tube   Place 15 mg into feeding tube at bedtime.         . Multiple Vitamin (MULTIVITAMIN) LIQD   Per Tube   Place 5 mLs into feeding tube daily.   60 mL   1   . Nutritional Supplements (FEEDING SUPPLEMENT, VITAL AF 1.2 CAL,) LIQD   Per Tube   Place 237 mLs into feeding tube every 3 (three) hours while awake.   1 Can   1   . pantoprazole sodium (PROTONIX) 40 mg/20 mL PACK   Per Tube   Place 40 mg into feeding tube daily.         . promethazine (PHENERGAN) 25 MG suppository   Rectal   Place 25 mg rectally every 6 (six) hours as needed for nausea.         . sucralfate (CARAFATE) 1 GM/10ML suspension   Per Tube   Place 10 mLs (1 g total) into feeding tube 4 (four) times daily -  with meals and at bedtime.   420 mL   0   . torsemide (DEMADEX) 20 MG tablet   Per Tube   Place 20 mg into feeding tube daily.         Marland Kitchen zolpidem  (AMBIEN) 10 MG tablet   Per Tube   Place 10 mg into feeding tube at  bedtime.          BP 168/103  Pulse 117  Temp(Src) 98 F (36.7 C) (Oral)  Resp 18  SpO2 98% Physical Exam  Nursing note and vitals reviewed. Constitutional: She is oriented to person, place, and time. She appears well-developed and well-nourished.  HENT:  Head: Normocephalic.  Mouth/Throat: Oropharynx is clear and moist. No oropharyngeal exudate.  Eyes: Conjunctivae and EOM are normal. Pupils are equal, round, and reactive to light.  Neck: Normal range of motion. Neck supple.  Cardiovascular: Normal rate, regular rhythm, normal heart sounds and intact distal pulses.  Exam reveals no gallop and no friction rub.   No murmur heard. Pulmonary/Chest: Effort normal and breath sounds normal. No respiratory distress. She has no wheezes.  Abdominal: Soft. Bowel sounds are normal. There is tenderness (diffuse tenderness to palpation, worse in the epigastric area.). There is no rebound and no guarding.  Mild maceration under her left breast and surrounding G-tube in left upper quadrant.  Genitourinary:  Normal rectal exam. Brown stool. hemeoccult neg.   Musculoskeletal: Normal range of motion. She exhibits no edema and no tenderness.  Neurological: She is alert and oriented to person, place, and time.  Skin: Skin is warm and dry.  Psychiatric: She has a normal mood and affect. Her behavior is normal.    ED Course  Procedures (including critical care time) Labs Review Labs Reviewed  CBC WITH DIFFERENTIAL - Abnormal; Notable for the following:    WBC 12.6 (*)    Neutro Abs 9.1 (*)    All other components within normal limits  COMPREHENSIVE METABOLIC PANEL - Abnormal; Notable for the following:    Sodium 134 (*)    Chloride 93 (*)    Glucose, Bld 267 (*)    Total Protein 8.6 (*)    Alkaline Phosphatase 120 (*)    Total Bilirubin 0.2 (*)    All other components within normal limits  LIPASE, BLOOD  URINALYSIS W  MICROSCOPIC + REFLEX CULTURE   Imaging Review No results found.  EKG Interpretation   None      Procedure note: Ultrasound Guided Peripheral IV Ultrasound guided 20 g peripheral 1.88 inch angiocath IV placement performed by me. Indications: Nursing unable to place IV. Details: The right antecubital fossa and upper arm was evaluated with a multifrequency linear probe. Several patent brachial veins are noted. 2 attempts were made to cannulate a right basilic vein under realtime US guidance with successful cannulation of the vein and catheter placement. There is return of non-pulsatile dark red blood. The patient tolerated the procedure well without complications.  Procedure note: Ultrasound Guided Peripheral IV Ultrasound guided 20 g peripheral 1.88 inch angiocath IV placement performed by me. Indications: Nursing unable to place IV, initial IV infiltrated. Details: The left antecubital fossa and upper arm was evaluated with a multifrequency linear probe. Several patent brachial veins are noted. 2 attempts were made to cannulate an antecubital vein under realtime US guidance with successful cannulation of the vein and catheter placement. There is return of non-pulsatile dark red blood. The patient tolerated the procedure well without complications.     MDM   1. Epigastric pain   2. Vomiting    9:42 AM 43 y.o. female with a history of diabetes, gastroparesis, chronic pancreatitis who presents with severe epigastric sharp pains and vomiting which began yesterday evening. She also had several episodes of emesis with bright red streaks of blood in them. She denies any fevers or other associated symptoms. She  states that her symptoms are consistent with her chronic pancreatitis. She is afebrile and tachycardic here. She has diffuse tenderness to palpation of the abdomen worse in the epigastric area. Will get screening labs, IV fluid, and symptom control.  Brown stool. Hemeoccult neg on my exam.    Discussed case w/ [facility] hospital[facility]. Will admit for sx control.    Blanchard Kelch, MD 08/13/13 1324

## 2013-08-13 NOTE — ED Notes (Signed)
Patient states she doesn't have to urinate. Will call when she does

## 2013-08-13 NOTE — ED Notes (Signed)
Urine isnt complete yet.

## 2013-08-13 NOTE — ED Notes (Signed)
Bed: WA20 Expected date:  Expected time:  Means of arrival:  Comments: pancreatitis

## 2013-08-13 NOTE — H&P (Addendum)
[facility] History and Physical  [name] [id number] DOB: [birth year] DOA: 08/13/2013  Referring physician:  PCP: [name], MD   Chief Complaint: Nausea and abdominal discomfort  HPI: [name] is a 43 y.o. female  With history of chronic pancreatitis, diabetes on insulin therapy at home and diabetic gastroparesis status post G-tube placement followed by Dr. Lacinda Axon and Adrian Blackwater. She is presenting to the ED complaining of the above complaints which started since yesterday. The problems have been persistent since onset. Nothing she is aware of other than pain medicine and antiemetics here help make it better. Otherwise food makes it worse. Patient denies any fevers or blood per rectum. Patient also states that she has been having nausea and has vomited throughout the night.  While in the ED patient was found to have an elevated white blood cell count of 12.6, sodium level on 134, chloride level of 93, hyperglycemia of 286 on BMP, with normal lipase and liver enzymes. We were subsequently consult at for pain management as well as further evaluation and recommendations. While in the ED she was given Dilaudid for her abdominal discomfort. Because of recent CT scan of her abdomen which reportedly was unrevealing per my discussion with the ED physician he decided not to repeat CT scan  Review of Systems:  Constitutional:  No weight loss, night sweats, Fevers, chills, fatigue.  HEENT:  No headaches, Difficulty swallowing,Tooth/dental problems,Sore throat,  No sneezing, itching, ear ache, nasal congestion, post nasal drip,  Cardio-vascular:  No chest pain, Orthopnea, PND, swelling in lower extremities, anasarca, dizziness, palpitations  GI:  No heartburn, indigestion, + abdominal pain, + nausea, + vomiting, diarrhea, change in bowel habits, + loss of appetite  Resp:  No shortness of breath with exertion or at rest. No excess mucus, no productive cough, No non-productive cough, No  coughing up of blood.No change in color of mucus.No wheezing.No chest wall deformity  Skin:  no rash or lesions.  GU:  no dysuria, change in color of urine, no urgency or frequency. No flank pain.  Musculoskeletal:  No joint pain or swelling. No decreased range of motion. No back pain.  Psych:  No change in mood or affect. No depression or anxiety. No memory loss.   Past Medical History  Diagnosis Date  . Hypertension   . Diabetes mellitus   . Gastroparesis   . Asthma   . GERD (gastroesophageal reflux disease)   . Coronary artery disease   . h/o seizure   . Neuropathy   . Chronic pancreatitis   . Dyslipidemia   . MI (myocardial infarction)   . COPD (chronic obstructive pulmonary disease)   . Seizures    Past Surgical History  Procedure Laterality Date  . Abdominal hysterectomy    . Cholecystectomy    . Peg tube x 4      feeding jejunostomies with PEG tubes  . Cesarean section    . Colon resection due to diverticulitis    . Back surgery    . Esophagogastroduodenoscopy N/A 12/27/2012    Procedure: ESOPHAGOGASTRODUODENOSCOPY (EGD);  Surgeon: Missy Sabins, MD;  Location: [facility];  Service: Endoscopy;  Laterality: N/A;  . Esophagogastroduodenoscopy (egd) with esophageal dilation N/A 02/19/2013    Procedure: ESOPHAGOGASTRODUODENOSCOPY (EGD) WITH ESOPHAGEAL DILATION;  Surgeon: Wonda Horner, MD;  Location: [facility];  Service: Endoscopy;  Laterality: N/A;  . Balloon dilation N/A 02/19/2013    Procedure: BALLOON DILATION;  Surgeon: Wonda Horner, MD;  Location: [facility];  Service:  Endoscopy;  Laterality: N/A;   Social History:  reports that she has quit smoking. Her smoking use included Cigarettes. She has a .5 pack-year smoking history. She has never used smokeless tobacco. She reports that she does not drink alcohol or use illicit drugs.  Allergies  Allergen Reactions  . Cefadroxil Other (See Comments)    Not sure of effects-listed as allergy on list from home  .  Darvocet [Propoxyphene N-Acetaminophen] Other (See Comments)    UNKNOWN  . Compazine Hives    Tolerates promethazine  . Nsaids Hives  . Penicillins Hives  . Zofran [Ondansetron] Hives    Family History  Problem Relation Age of Onset  . Cancer Mother      Prior to Admission medications   Medication Sig Start Date End Date Taking? Authorizing Provider  acetaminophen (TYLENOL) 160 MG/5ML solution Place 20.3 mLs (650 mg total) into feeding tube 3 (three) times daily. 05/29/13  Yes Robbie Lis, MD  atorvastatin (LIPITOR) 40 MG tablet Place 1 tablet (40 mg total) into feeding tube daily at 6 PM. 02/20/13  Yes Robbie Lis, MD  busPIRone (BUSPAR) 5 MG tablet Place 5 mg into feeding tube 3 (three) times daily.   Yes Historical Provider, MD  Fluticasone-Salmeterol (ADVAIR) 250-50 MCG/DOSE AEPB Inhale 1 puff into the lungs 2 (two) times daily. 12/02/11  Yes Robbie Lis, MD  gabapentin (NEURONTIN) 600 MG tablet Place 600 mg into feeding tube 3 (three) times daily.    Yes Historical Provider, MD  insulin aspart (NOVOLOG) 100 UNIT/ML injection Inject 0-9 Units into the skin every 4 (four) hours. 05/29/13  Yes Robbie Lis, MD  insulin glargine (LANTUS) 100 UNIT/ML injection Inject 15 Units into the skin at bedtime.   Yes Historical Provider, MD  labetalol (NORMODYNE) 100 MG tablet Place 1 tablet (100 mg total) into feeding tube 2 (two) times daily. 08/03/13  Yes Allie Bossier, MD  lisinopril (PRINIVIL,ZESTRIL) 10 MG tablet Place 10 mg into feeding tube daily.  03/29/13  Yes Historical Provider, MD  magnesium oxide (MAG-OX) 400 MG tablet Place 400 mg into feeding tube daily.   Yes Historical Provider, MD  metoCLOPramide (REGLAN) 5 MG/5ML solution Take 10 mLs (10 mg total) by mouth 3 (three) times daily before meals. 05/29/13  Yes Robbie Lis, MD  mirtazapine (REMERON) 15 MG tablet Place 15 mg into feeding tube at bedtime.   Yes Historical Provider, MD  Multiple Vitamin (MULTIVITAMIN) LIQD Place 5  mLs into feeding tube daily. 05/07/13  Yes Hosie Poisson, MD  Nutritional Supplements (FEEDING SUPPLEMENT, VITAL AF 1.2 CAL,) LIQD Place 237 mLs into feeding tube every 3 (three) hours while awake. 05/29/13  Yes Robbie Lis, MD  pantoprazole sodium (PROTONIX) 40 mg/20 mL PACK Place 40 mg into feeding tube daily.   Yes Historical Provider, MD  promethazine (PHENERGAN) 25 MG suppository Place 25 mg rectally every 6 (six) hours as needed for nausea.   Yes Historical Provider, MD  sucralfate (CARAFATE) 1 GM/10ML suspension Place 10 mLs (1 g total) into feeding tube 4 (four) times daily -  with meals and at bedtime. 05/13/13  Yes Hoy Morn, MD  torsemide (DEMADEX) 20 MG tablet Place 20 mg into feeding tube daily.   Yes Historical Provider, MD  zolpidem (AMBIEN) 10 MG tablet Place 10 mg into feeding tube at bedtime. 05/22/13  Yes Historical Provider, MD   Physical Exam: Filed Vitals:   08/13/13 1614  BP: 157/97  Pulse: 102  Temp:  98.1 F (36.7 C)  Resp: 19    BP 157/97  Pulse 102  Temp(Src) 98.1 F (36.7 C) (Oral)  Resp 19  SpO2 100%  General:   patient appears uncomfortable, but in no acute distress, nontoxic  Eyes: PERRL, normal lids, irises & conjunctiva ENT: grossly normal hearing, lips & tongue, Dry mucous membranes  Neck: no LAD, masses or thyromegaly Cardiovascular: RRR, no m/r/g. No LE edema. Telemetry: SR, no arrhythmias  Respiratory: CTA bilaterally, no w/r/r. Normal respiratory effort. Abdomen:  soft, discomfort with palpation generalized, G-tube visible with no surrounding cellulitis, nondistended, hypoactive bowel  Skin: no rash or induration seen on limited exam Musculoskeletal: grossly normal tone BUE/BLE Psychiatric: grossly normal mood and affect, speech fluent and appropriate Neurologic:  patient answers questions appropriately, moves extremities equally           Labs on Admission:  Basic Metabolic Panel:  Recent Labs Lab 08/13/13 0950  NA 134*  K 4.0   CL 93*  CO2 25  GLUCOSE 267*  BUN 18  CREATININE 0.62  CALCIUM 9.5   Liver Function Tests:  Recent Labs Lab 08/13/13 0950  AST 21  ALT 16  ALKPHOS 120*  BILITOT 0.2*  PROT 8.6*  ALBUMIN 3.6    Recent Labs Lab 08/13/13 0950  LIPASE 16   No results found for this basename: AMMONIA,  in the last 168 hours CBC:  Recent Labs Lab 08/13/13 0950  WBC 12.6*  NEUTROABS 9.1*  HGB 14.0  HCT 39.5  MCV 85.3  PLT 353   Cardiac Enzymes: No results found for this basename: CKTOTAL, CKMB, CKMBINDEX, TROPONINI,  in the last 168 hours  BNP (last 3 results) No results found for this basename: PROBNP,  in the last 8760 hours CBG: No results found for this basename: GLUCAP,  in the last 168 hours  Radiological Exams on Admission: No results found.   Assessment/Plan Active Problems:   Epigastric pain/Diabetic gastroparesis (principal problems) -Normal lipase normal liver enzymes. At this point suspect diabetic gastroparesis as cause. We'll continue home regimen. - Advance diet as tolerated but will start a clear - Supportive therapy with pain medication and anti-emetics. Of note patient reports being allergic to Zofran - Will obtain abdominal KUB - Should patient continue to have abdominal discomfort without much amelioration next a.m. would consider consulting gastroenterology in the a.m. Patient would be unassigned since her gastroenterologist is from [facility]   Hypertension - We'll continue home regimen and continue to assess  Nausea and vomiting - Most likely secondary to diabetic gastroparesis - Anti-emetics and supportive therapy - Will place on maintenance IV fluids  Protein calorie malnutrition - Continue home regimen and most likely secondary to principal problems listed above  Diabetes mellitus type 2 - Will decrease home Lantus dose by half and place on sliding scale insulin - Routine CBG monitoring - Obtain hemoglobin A1c  GERD - We'll increase  Protonix to 40 mg by mouth twice a day as this could be contributing to nausea and/or discomfort.   Code Status: Full code  Family Communication: Discussed with patient and family member at bedside  Disposition Plan: Pending improvement of abdominal discomfort  Time spent: > 70 minutes  Kumar Falwell, [facility] Pager [phone]  Addendum: Given history not entirely convinced that this is infectious as far as etiologies concern. Given history a would suspect most likely diabetic gastroparesis. We'll hold on antibiotic administration. As I suspect the leukocytosis is secondary to stress reaction. Reassess next a.m.

## 2013-08-13 NOTE — ED Notes (Signed)
Per EMS- Patient c/o of Left Upper and left lower abdominal pain. Patient c/o of throwing up blood since last night. EMS reported that they did not see any blood in he emesis. Patient was discharged from [facility] on 07/05/13 with pancreatitis. Patient has a feeding tube.

## 2013-08-13 NOTE — Progress Notes (Signed)
UR completed 

## 2013-08-14 ENCOUNTER — Inpatient Hospital Stay (HOSPITAL_COMMUNITY): Payer: Medicare Other

## 2013-08-14 DIAGNOSIS — E1142 Type 2 diabetes mellitus with diabetic polyneuropathy: Secondary | ICD-10-CM

## 2013-08-14 DIAGNOSIS — F1910 Other psychoactive substance abuse, uncomplicated: Secondary | ICD-10-CM

## 2013-08-14 DIAGNOSIS — I10 Essential (primary) hypertension: Secondary | ICD-10-CM

## 2013-08-14 DIAGNOSIS — R042 Hemoptysis: Secondary | ICD-10-CM

## 2013-08-14 LAB — GLUCOSE, CAPILLARY
GLUCOSE-CAPILLARY: 158 mg/dL — AB (ref 70–99)
GLUCOSE-CAPILLARY: 117 mg/dL — AB (ref 70–99)
Glucose-Capillary: 146 mg/dL — ABNORMAL HIGH (ref 70–99)
Glucose-Capillary: 93 mg/dL (ref 70–99)
Glucose-Capillary: 193 mg/dL — ABNORMAL HIGH (ref 70–99)

## 2013-08-14 LAB — URINE CULTURE
COLONY COUNT: NO GROWTH
CULTURE: NO GROWTH

## 2013-08-14 MED ORDER — METOCLOPRAMIDE HCL 5 MG/ML IJ SOLN
10.0000 mg | Freq: Three times a day (TID) | INTRAMUSCULAR | Status: DC
  Administered 2013-08-14 – 2013-08-18 (×13): 10 mg via INTRAVENOUS
  Filled 2013-08-14 (×16): qty 2

## 2013-08-14 MED ORDER — HYDROMORPHONE HCL PF 1 MG/ML IJ SOLN
1.0000 mg | INTRAMUSCULAR | Status: DC | PRN
  Administered 2013-08-14 – 2013-08-18 (×24): 1 mg via INTRAVENOUS
  Filled 2013-08-14 (×23): qty 1

## 2013-08-14 MED ORDER — GLUCERNA SHAKE PO LIQD
237.0000 mL | Freq: Two times a day (BID) | ORAL | Status: DC
  Filled 2013-08-14 (×5): qty 237

## 2013-08-14 MED ORDER — PANTOPRAZOLE SODIUM 40 MG IV SOLR
40.0000 mg | Freq: Two times a day (BID) | INTRAVENOUS | Status: DC
  Administered 2013-08-14 – 2013-08-17 (×6): 40 mg via INTRAVENOUS
  Filled 2013-08-14 (×7): qty 40

## 2013-08-14 NOTE — Procedures (Signed)
Successful placement of dual lumen power injectable PICC line to right brachial vein. Length 41cm Tip at lower SVC/RA No complications Ready for use.  Tsosie Billing PA-C Interventional Radiology  08/14/13  1:53 PM

## 2013-08-14 NOTE — Consult Note (Signed)
Referring Provider: Dr. Sherral Hammers Primary Care Physician:  [name], MD Primary Gastroenterologist:  UNASSIGNED  Reason for Consultation:  Hematemesis; Nausea; Vomiting  HPI: [name] is a 43 y.o. female with gastroparesis and chronic pain who has a PEG tube for feedings reports having nausea for the past week and then 2 days ago vomited up yellowish fluid once followed by multiple episodes of bright red blood in her vomitus that day and night. Reports filling up an emesis basin with blood that night. Denies having any further bleeding since that night. Reports having black stools that day as well but has not had any since then. Chronically has abdominal pain but reports a worsening of this pain after the vomiting started stating it is mainly on the left-side. Has had heartburn for weeks. Last EGD in July 2014 that was done due to dysphagia and was unrevealing for any esophageal abnormalities. She reports eating ok until 2 days ago when the vomiting started.   Past Medical History  Diagnosis Date  . Hypertension   . Diabetes mellitus   . Gastroparesis   . Asthma   . GERD (gastroesophageal reflux disease)   . Coronary artery disease   . h/o seizure   . Neuropathy   . Chronic pancreatitis   . Dyslipidemia   . MI (myocardial infarction)   . COPD (chronic obstructive pulmonary disease)   . Seizures     Past Surgical History  Procedure Laterality Date  . Abdominal hysterectomy    . Cholecystectomy    . Peg tube x 4      feeding jejunostomies with PEG tubes  . Cesarean section    . Colon resection due to diverticulitis    . Back surgery    . Esophagogastroduodenoscopy N/A 12/27/2012    Procedure: ESOPHAGOGASTRODUODENOSCOPY (EGD);  Surgeon: Missy Sabins, MD;  Location: [facility];  Service: Endoscopy;  Laterality: N/A;  . Esophagogastroduodenoscopy (egd) with esophageal dilation N/A 02/19/2013    Procedure: ESOPHAGOGASTRODUODENOSCOPY (EGD) WITH ESOPHAGEAL DILATION;  Surgeon: Wonda Horner, MD;  Location: [facility];  Service: Endoscopy;  Laterality: N/A;  . Balloon dilation N/A 02/19/2013    Procedure: BALLOON DILATION;  Surgeon: Wonda Horner, MD;  Location: [facility];  Service: Endoscopy;  Laterality: N/A;    Prior to Admission medications   Medication Sig Start Date End Date Taking? Authorizing Provider  acetaminophen (TYLENOL) 160 MG/5ML solution Place 20.3 mLs (650 mg total) into feeding tube 3 (three) times daily. 05/29/13  Yes Robbie Lis, MD  atorvastatin (LIPITOR) 40 MG tablet Place 1 tablet (40 mg total) into feeding tube daily at 6 PM. 02/20/13  Yes Robbie Lis, MD  busPIRone (BUSPAR) 5 MG tablet Place 5 mg into feeding tube 3 (three) times daily.   Yes Historical Provider, MD  Fluticasone-Salmeterol (ADVAIR) 250-50 MCG/DOSE AEPB Inhale 1 puff into the lungs 2 (two) times daily. 12/02/11  Yes Robbie Lis, MD  gabapentin (NEURONTIN) 600 MG tablet Place 600 mg into feeding tube 3 (three) times daily.    Yes Historical Provider, MD  insulin aspart (NOVOLOG) 100 UNIT/ML injection Inject 0-9 Units into the skin every 4 (four) hours. 05/29/13  Yes Robbie Lis, MD  insulin glargine (LANTUS) 100 UNIT/ML injection Inject 15 Units into the skin at bedtime.   Yes Historical Provider, MD  labetalol (NORMODYNE) 100 MG tablet Place 1 tablet (100 mg total) into feeding tube 2 (two) times daily. 08/03/13  Yes Allie Bossier, MD  lisinopril (PRINIVIL,ZESTRIL)  10 MG tablet Place 10 mg into feeding tube daily.  03/29/13  Yes Historical Provider, MD  magnesium oxide (MAG-OX) 400 MG tablet Place 400 mg into feeding tube daily.   Yes Historical Provider, MD  metoCLOPramide (REGLAN) 5 MG/5ML solution Take 10 mLs (10 mg total) by mouth 3 (three) times daily before meals. 05/29/13  Yes Robbie Lis, MD  mirtazapine (REMERON) 15 MG tablet Place 15 mg into feeding tube at bedtime.   Yes Historical Provider, MD  Multiple Vitamin (MULTIVITAMIN) LIQD Place 5 mLs into feeding tube  daily. 05/07/13  Yes Hosie Poisson, MD  Nutritional Supplements (FEEDING SUPPLEMENT, VITAL AF 1.2 CAL,) LIQD Place 237 mLs into feeding tube every 3 (three) hours while awake. 05/29/13  Yes Robbie Lis, MD  pantoprazole sodium (PROTONIX) 40 mg/20 mL PACK Place 40 mg into feeding tube daily.   Yes Historical Provider, MD  promethazine (PHENERGAN) 25 MG suppository Place 25 mg rectally every 6 (six) hours as needed for nausea.   Yes Historical Provider, MD  sucralfate (CARAFATE) 1 GM/10ML suspension Place 10 mLs (1 g total) into feeding tube 4 (four) times daily -  with meals and at bedtime. 05/13/13  Yes Hoy Morn, MD  torsemide (DEMADEX) 20 MG tablet Place 20 mg into feeding tube daily.   Yes Historical Provider, MD  zolpidem (AMBIEN) 10 MG tablet Place 10 mg into feeding tube at bedtime. 05/22/13  Yes Historical Provider, MD    Scheduled Meds: . acetaminophen  650 mg Per Tube TID  . atorvastatin  40 mg Per Tube q1800  . busPIRone  5 mg Per Tube TID  . feeding supplement (GLUCERNA SHAKE)  237 mL Oral BID BM  . feeding supplement (VITAL AF 1.2 CAL)  237 mL Per Tube Q3H while awake  . gabapentin  600 mg Per Tube Q8H  . heparin  5,000 Units Subcutaneous Q8H  . insulin aspart  0-15 Units Subcutaneous TID WC  . insulin aspart  0-5 Units Subcutaneous QHS  . insulin glargine  7 Units Subcutaneous QHS  . labetalol  100 mg Per Tube BID  . lisinopril  10 mg Per Tube Daily  . magnesium oxide  400 mg Per Tube Daily  . metoCLOPramide  10 mg Oral TID AC  . mirtazapine  15 mg Per Tube QHS  . mometasone-formoterol  2 puff Inhalation BID  . multivitamin  5 mL Per Tube Daily  . pantoprazole sodium  40 mg Per Tube BID  . sucralfate  1 g Per Tube TID WC & HS  . zolpidem  10 mg Per Tube QHS   Continuous Infusions: . dextrose 5 % and 0.9% NaCl 100 mL/hr at 08/14/13 1456   PRN Meds:.HYDROmorphone (DILAUDID) injection, oxyCODONE, promethazine, promethazine  Allergies as of 08/13/2013 - Review  Complete 08/13/2013  Allergen Reaction Noted  . Cefadroxil Other (See Comments) 12/26/2012  . Darvocet [propoxyphene n-acetaminophen] Other (See Comments) 01/13/2011  . Compazine Hives 01/13/2011  . Nsaids Hives 01/13/2011  . Penicillins Hives 01/13/2011  . Zofran [ondansetron] Hives 12/26/2012    Family History  Problem Relation Age of Onset  . Cancer Mother     History   Social History  . Marital Status: Divorced    Spouse Name: N/A    Number of Children: N/A  . Years of Education: N/A   Occupational History  . unemployed    Social History Main Topics  . Smoking status: Former Smoker -- 0.25 packs/day for 2 years  Types: Cigarettes  . Smokeless tobacco: Never Used  . Alcohol Use: No  . Drug Use: No  . Sexual Activity: No   Other Topics Concern  . Not on file   Social History Narrative  . No narrative on file    Review of Systems: All negative except as stated above in HPI.  Physical Exam: Vital signs: Filed Vitals:   08/14/13 1429  BP: 142/98  Pulse: 94  Temp: 99 F (37.2 C)  Resp: 18   Last BM Date: 09/08/13 General:   Alert,  Well-developed, well-nourished, pleasant and cooperative in NAD Lungs:  Clear throughout to auscultation.   No wheezes, crackles, or rhonchi. No acute distress. Heart:  Regular rate and rhythm; no murmurs, clicks, rubs,  or gallops. Abdomen: diffusely tender with voluntary guarding to light palpation, button PEG intact, soft, nondistended, +BS  Rectal:  Deferred Ext: no edema  GI:  Lab Results:  Recent Labs  September 08, 2013 0950  WBC 12.6*  HGB 14.0  HCT 39.5  PLT 353   BMET  Recent Labs  2013-09-08 0950  NA 134*  K 4.0  CL 93*  CO2 25  GLUCOSE 267*  BUN 18  CREATININE 0.62  CALCIUM 9.5   LFT  Recent Labs  Sep 08, 2013 0950  PROT 8.6*  ALBUMIN 3.6  AST 21  ALT 16  ALKPHOS 120*  BILITOT 0.2*   PT/INR No results found for this basename: LABPROT, INR,  in the last 72 hours   Studies/Results: Dg Abd 1  View  09-08-13   CLINICAL DATA:  Abdominal pain, nausea and vomiting.  EXAM: ABDOMEN - 1 VIEW  COMPARISON:  Chest in two views abdomen 08/01/2013.  FINDINGS: The bowel gas pattern is unremarkable. No abnormal abdominal calcification is seen. Feeding tube is noted. Cholecystectomy clips and postoperative change of lower lumbar fusion also noted  IMPRESSION: No acute finding.   Electronically Signed   By: Inge Rise M.D.   On: 09/08/2013 17:55   Ir Fluoro Guide Cv Line Right  08/14/2013   CLINICAL DATA:  Poor venous access, request for image guided PICC placement.  EXAM: DUAL LUMEN POWER INJECTABLE PICC LINE PLACEMENT WITH ULTRASOUND AND FLUOROSCOPIC GUIDANCE  FLUOROSCOPY TIME:  0.3 minutes  PROCEDURE: The patient was advised of the possible risks and complications and agreed to undergo the procedure. The patient was then brought to the angiographic suite for the procedure.  The right arm was prepped with chlorhexidine, draped in the usual sterile fashion using maximum barrier technique (cap and mask, sterile gown, sterile gloves, large sterile sheet, hand hygiene and cutaneous antisepsis) and infiltrated locally with 1% Lidocaine.  Ultrasound demonstrated patency of the right brachial vein, and this was documented with an image. Under real-time ultrasound guidance, this vein was accessed with a 21 gauge micropuncture needle and image documentation was performed. A 0.018 wire was introduced in to the vein. Over this, a 5 [place] dual lumen power-injectable PICC was advanced to the lower SVC/right atrial junction. Fluoroscopy during the procedure and fluoro spot radiograph confirms appropriate catheter position. The catheter was flushed and covered with a sterile dressing.  Complications: none.  IMPRESSION: Successful right arm Power injectable PICC line placement with ultrasound and fluoroscopic guidance. The catheter is ready for use.  Read By:  Tsosie Billing PA-C   Electronically Signed   By: Markus Daft  M.D.   On: 08/14/2013 16:07   Ir US Guide Vasc Access Right  08/14/2013   CLINICAL DATA:  Poor venous access,  request for image guided PICC placement.  EXAM: DUAL LUMEN POWER INJECTABLE PICC LINE PLACEMENT WITH ULTRASOUND AND FLUOROSCOPIC GUIDANCE  FLUOROSCOPY TIME:  0.3 minutes  PROCEDURE: The patient was advised of the possible risks and complications and agreed to undergo the procedure. The patient was then brought to the angiographic suite for the procedure.  The right arm was prepped with chlorhexidine, draped in the usual sterile fashion using maximum barrier technique (cap and mask, sterile gown, sterile gloves, large sterile sheet, hand hygiene and cutaneous antisepsis) and infiltrated locally with 1% Lidocaine.  Ultrasound demonstrated patency of the right brachial vein, and this was documented with an image. Under real-time ultrasound guidance, this vein was accessed with a 21 gauge micropuncture needle and image documentation was performed. A 0.018 wire was introduced in to the vein. Over this, a 5 [place] dual lumen power-injectable PICC was advanced to the lower SVC/right atrial junction. Fluoroscopy during the procedure and fluoro spot radiograph confirms appropriate catheter position. The catheter was flushed and covered with a sterile dressing.  Complications: none.  IMPRESSION: Successful right arm Power injectable PICC line placement with ultrasound and fluoroscopic guidance. The catheter is ready for use.  Read By:  Tsosie Billing PA-C   Electronically Signed   By: Markus Daft M.D.   On: 08/14/2013 16:07    Impression/Plan: 43 yo with known gastroparesis and chronic pain with chronic narcotic pain meds reports 1 day history of hematemesis and melena 2 days prior to admit with normal Hgb and BUN on admit. She could have had a Mallory Weiss tear with having nonbloody emesis prior to the onset of the bright red blood emesis. No recurrence and would recommend supportive care with IV PPI Q 12  hours, liquid diet (advance as tolerated), IVFs. Agree with Carafate. Her nausea is chronic and likely related to her gastroparesis and pain meds. I do not think another EGD is needed at this time and would manage conservatively.    LOS: 1 day   Tatamy C.  08/14/2013, 6:25 PM

## 2013-08-14 NOTE — Progress Notes (Signed)
INITIAL NUTRITION ASSESSMENT  DOCUMENTATION CODES Per approved criteria  -Not Applicable   INTERVENTION: - Encouraged increased meal intake as nausea resolves - Glucerna shakes BID - If plan is for pt to meet nutritional needs via TF, recommend Osmolite 1.5 via G tube, start at 35m/hr and increase by 174mevery 4 hours to goal of 5019mr run continuously which will provide 1800 calories, 75g protein, and 914m36mee water which will meet 100% of estimated calorie needs and 94% estimated protein needs. If IVF d/c, recommend 150ml7mer flushes 6 times/day.  - RD to continue to monitor   NUTRITION DIAGNOSIS: Inadequate oral intake related to poor appetite, nausea as evidenced by 0% meal intake.   Goal: 1. Resolution of nausea 2. Pt to consume >90% of meals/supplements 3. Resume TF if pt eating <50% of meals  Monitor:  Weights, labs, intake, nausea, TF   Reason for Assessment: Malnutrition screening tool, home TF  42 y.70 female  Admitting Dx: Nausea and abdominal discomfort   ASSESSMENT: Admitted with c/o throwing up blood. Pt history of DM, G tube, gastroparesis, chronic pancreatitis who presents with severe epigastric sharp pains and vomiting which began evening of 08/13/13. She also had several episodes of emesis with bright red streaks of blood in them. She denies any fevers or other associated symptoms. She states that her symptoms are consistent with her chronic pancreatitis.  Met with pt who reports she was using TF via PEG of Osmolite 1.5 at 60ml/74m 14 hours at night and eating 1 meal/day - typically some pretzels and ginger-ale. Says she has been eating this way for the past week. Thinks she has lost 5 pounds recently - weight trend shows pt's weight down 5 pounds in the past 2 weeks. C/o nausea today but no vomiting, only dry heaves. Current TF ordered is 1 can of Vital AF 1.2 every 3 hours while awake, however pt refused morning can.   Home TF of Osmolite 1.5 at 60ml/h59m 14 hours provides - 1260 calories, 53g protein, 640ml fr55mater and was meeting 79% estimated calorie needs, 66% estimated protein needs   Height: Ht Readings from Last 1 Encounters:  08/13/13 _0  (1.626 m)    Weight: Wt Readings from Last 1 Encounters:  08/14/13 144 lb 4.8 oz (65.454 kg)    Ideal Body Weight: 120 lb  % Ideal Body Weight: 120%  Wt Readings from Last 10 Encounters:  08/14/13 144 lb 4.8 oz (65.454 kg)  08/03/13 152 lb 5.4 oz (69.1 kg)  05/27/13 141 lb 12.1 oz (64.3 kg)  05/07/13 142 lb 13.7 oz (64.8 kg)  04/11/13 135 lb 2.3 oz (61.3 kg)  02/17/13 139 lb 1.8 oz (63.1 kg)  02/17/13 139 lb 1.8 oz (63.1 kg)  02/06/13 141 lb 1.5 oz (64 kg)  01/19/13 145 lb 4.5 oz (65.9 kg)  12/26/12 152 lb (68.947 kg)    Usual Body Weight: 149 lb on 07/30/13 per RD note  % Usual Body Weight: 97%  BMI:  Body mass index is 24.76 kg/(m^2).  Estimated Nutritional Needs: Kcal: 1600-1800  Protein: 80-95 gram  Fluid: 2100 ml//daily  Skin: PTA  Diet Order: Full Liquid  EDUCATION NEEDS: -No education needs identified at this time  No intake or output data in the 24 hours ending 08/14/13 1029  Last BM: 1/13  Labs:   Recent Labs Lab 08/13/13 0950 08/13/13 1909  NA 134*  --   K 4.0  --   CL 93*  --  CO2 25  --   BUN 18  --   CREATININE 0.62  --   CALCIUM 9.5  --   MG  --  1.5  PHOS  --  2.8  GLUCOSE 267*  --     CBG (last 3)   Recent Labs  08/13/13 2219 08/14/13 0405 08/14/13 0812  GLUCAP 183* 158* 146*    Scheduled Meds: . sodium chloride   Intravenous STAT  . acetaminophen  650 mg Per Tube TID  . atorvastatin  40 mg Per Tube q1800  . busPIRone  5 mg Per Tube TID  . feeding supplement (VITAL AF 1.2 CAL)  237 mL Per Tube Q3H while awake  . gabapentin  600 mg Per Tube Q8H  . heparin  5,000 Units Subcutaneous Q8H  . insulin aspart  0-15 Units Subcutaneous TID WC  . insulin aspart  0-5 Units Subcutaneous QHS  . insulin glargine  7 Units  Subcutaneous QHS  . labetalol  100 mg Per Tube BID  . lisinopril  10 mg Per Tube Daily  . magnesium oxide  400 mg Per Tube Daily  . metoCLOPramide  10 mg Oral TID AC  . mirtazapine  15 mg Per Tube QHS  . mometasone-formoterol  2 puff Inhalation BID  . multivitamin  5 mL Per Tube Daily  . pantoprazole sodium  40 mg Per Tube BID  . sucralfate  1 g Per Tube TID WC & HS  . zolpidem  10 mg Per Tube QHS    Continuous Infusions: . dextrose 5 % and 0.9% NaCl 100 mL/hr at 08/13/13 1103    Past Medical History  Diagnosis Date  . Hypertension   . Diabetes mellitus   . Gastroparesis   . Asthma   . GERD (gastroesophageal reflux disease)   . Coronary artery disease   . h/o seizure   . Neuropathy   . Chronic pancreatitis   . Dyslipidemia   . MI (myocardial infarction)   . COPD (chronic obstructive pulmonary disease)   . Seizures     Past Surgical History  Procedure Laterality Date  . Abdominal hysterectomy    . Cholecystectomy    . Peg tube x 4      feeding jejunostomies with PEG tubes  . Cesarean section    . Colon resection due to diverticulitis    . Back surgery    . Esophagogastroduodenoscopy N/A 12/27/2012    Procedure: ESOPHAGOGASTRODUODENOSCOPY (EGD);  Surgeon: Missy Sabins, MD;  Location: [facility];  Service: Endoscopy;  Laterality: N/A;  . Esophagogastroduodenoscopy (egd) with esophageal dilation N/A 02/19/2013    Procedure: ESOPHAGOGASTRODUODENOSCOPY (EGD) WITH ESOPHAGEAL DILATION;  Surgeon: Wonda Horner, MD;  Location: [facility];  Service: Endoscopy;  Laterality: N/A;  . Balloon dilation N/A 02/19/2013    Procedure: BALLOON DILATION;  Surgeon: Wonda Horner, MD;  Location: [facility];  Service: Endoscopy;  Laterality: N/A;    Mikey College MS, [place], [place] Pager [phone] After Hours Pager

## 2013-08-14 NOTE — Progress Notes (Signed)
[facility] PROGRESS NOTE  [name] [id number] DOB: [birth year] DOA: 08/13/2013 PCP: [name], MD  Assessment/Plan:  Epigastric pain/Diabetic gastroparesis   -Normal lipase normal liver enzymes. At this point suspect diabetic gastroparesis as cause.  -Patient has acutely stopped her pain regimen on 08/03/2013. Although 10 days since patient had been on large dose narcotics (per patient) for an extended period time the bowel  May not have yet recovered which could be contributing to her gastroparesis.  -Will continue n.p.o. until GI decides if they will conduct EGD; patient has never been seen in [place] [facility]. Had been seen by a gastroenterologist in [place] (name unknown) -Supportive therapy with pain medication and anti-emetics. Of note patient reports being allergic to Zofran and Compazine  - Acute abdominal series obtained; see results below  -Will switch patient's Reglan from by mouth to IV -Consulted ([facility]) doctor schooler [phone]; await his recommendations -Obtain UDS -Obtain occult blood card/Gastroccult card    Hypertension  - Patient on labetalol and lisinopril for BP control but has refused secondary to   Nausea and vomiting  - Most likely secondary to diabetic gastroparesis  - Anti-emetics and supportive therapy  - Continue maintenance IV fluids   Protein calorie malnutrition  - Continue home regimen and most likely secondary to principal problems listed above   Diabetes mellitus type 2  - Will decrease home Lantus dose by half and place on sliding scale insulin  - Routine CBG monitoring  - Obtain hemoglobin A1c   GERD  - Continue IV Protonix to 40 mg BID.        Code Status: Full Family Communication: [name] present for discussion of plan of care Disposition Plan: Per GI   Consultants: Doctor [name] [facility])   Procedures: 08/14/2013 dual lumen power injectable PICC line to right brachial vein.  Acute abdominal  series 08/13/2013 No acute finding    Antibiotics:    HPI/Subjective: [name] is a 43 y.o. Female BF PMHx DRUG SEEKING BEHAVIOR (outside physician prescribes liquid methadone per PEG), Hx chronic pancreatitis, diabetes on insulin therapy, diabetic gastroparesis, S/P G-tube placement followed by Dr. Lacinda [name] and [name], HTN, HLD, chronic leukocytosis. ,. She is presenting to the ED complaining of the above complaints which started since yesterday. The problems have been persistent since onset. Nothing she is aware of other than pain medicine and antiemetics here help make it better. Otherwise food makes it worse. Patient denies any fevers or blood per rectum. Patient also states that she has been having nausea and has vomited throughout the night.  While in the ED patient was found to have an elevated white blood cell count of 12.6, sodium level on 134, chloride level of 93, hyperglycemia of 286 on BMP, with normal lipase and liver enzymes. We were subsequently consult at for pain management as well as further evaluation and recommendations. While in the ED she was given Dilaudid for her abdominal discomfort. Because of recent CT scan of her abdomen which reportedly was unrevealing per my discussion with the ED physician he decided not to repeat CT scan 08/14/2013 review of patient's labs on admission show that lipase within normal limit, glucose elevated to 267, CBC mildly elevated without left shift or bands. States that starting 2 days ago  throwing up frank blood. Also states after discharge from hospital on 08/03/2013 she decided she needed to stop her pain medications so stopped methadone and other narcotics on her own. Currently rates abdominal pain at 8/10LUQ/LLQ> and her  of abdomen    Objective: Filed Vitals:   08/13/13 2216 08/13/13 2223 08/14/13 0540 08/14/13 0745  BP:  137/87 154/93   Pulse:  102 98   Temp:  98.4 F (36.9 C) 99.1 F (37.3 C)   TempSrc:  Oral Oral   Resp:  20 20    Height:      Weight:   65.454 kg (144 lb 4.8 oz)   SpO2: 99% 99% 99% 97%   No intake or output data in the 24 hours ending 08/14/13 0823 Filed Weights   08/13/13 1815 08/14/13 0540  Weight: 64.411 kg (142 lb) 65.454 kg (144 lb 4.8 oz)    Exam:   General:  A./O. x4, moderate pain abdominal rate at 8/10  Cardiovascular: Regular rhythm and rate, negative murmurs rubs gallops, DP/PT pulse +2  Respiratory: Clear to auscultation bilateral  Abdomen: Diffuse abdominal pain LUQ/LLQ> RLQ/LUQ  Musculoskeletal: Negative pedal edema, negative cyanosis   Data Reviewed: Basic Metabolic Panel:  Recent Labs Lab 08/13/13 0950 08/13/13 1909  NA 134*  --   K 4.0  --   CL 93*  --   CO2 25  --   GLUCOSE 267*  --   BUN 18  --   CREATININE 0.62  --   CALCIUM 9.5  --   MG  --  1.5  PHOS  --  2.8   Liver Function Tests:  Recent Labs Lab 08/13/13 0950  AST 21  ALT 16  ALKPHOS 120*  BILITOT 0.2*  PROT 8.6*  ALBUMIN 3.6    Recent Labs Lab 08/13/13 0950  LIPASE 16   No results found for this basename: AMMONIA,  in the last 168 hours CBC:  Recent Labs Lab 08/13/13 0950  WBC 12.6*  NEUTROABS 9.1*  HGB 14.0  HCT 39.5  MCV 85.3  PLT 353   Cardiac Enzymes: No results found for this basename: CKTOTAL, CKMB, CKMBINDEX, TROPONINI,  in the last 168 hours BNP (last 3 results) No results found for this basename: PROBNP,  in the last 8760 hours CBG:  Recent Labs Lab 08/13/13 1747 08/13/13 2219 08/14/13 0405 08/14/13 0812  GLUCAP 162* 183* 158* 146*    No results found for this or any previous visit (from the past 240 hour(s)).   Studies: Dg Abd 1 View  08/13/2013   CLINICAL DATA:  Abdominal pain, nausea and vomiting.  EXAM: ABDOMEN - 1 VIEW  COMPARISON:  Chest in two views abdomen 08/01/2013.  FINDINGS: The bowel gas pattern is unremarkable. No abnormal abdominal calcification is seen. Feeding tube is noted. Cholecystectomy clips and postoperative change of lower  lumbar fusion also noted  IMPRESSION: No acute finding.   Electronically Signed   By: Inge Rise M.D.   On: 08/13/2013 17:55    Scheduled Meds: . sodium chloride   Intravenous STAT  . acetaminophen  650 mg Per Tube TID  . atorvastatin  40 mg Per Tube q1800  . busPIRone  5 mg Per Tube TID  . feeding supplement (VITAL AF 1.2 CAL)  237 mL Per Tube Q3H while awake  . gabapentin  600 mg Per Tube Q8H  . heparin  5,000 Units Subcutaneous Q8H  . insulin aspart  0-15 Units Subcutaneous TID WC  . insulin aspart  0-5 Units Subcutaneous QHS  . insulin glargine  7 Units Subcutaneous QHS  . labetalol  100 mg Per Tube BID  . lisinopril  10 mg Per Tube Daily  . magnesium oxide  400 mg Per  Tube Daily  . metoCLOPramide  10 mg Oral TID AC  . mirtazapine  15 mg Per Tube QHS  . mometasone-formoterol  2 puff Inhalation BID  . multivitamin  5 mL Per Tube Daily  . pantoprazole sodium  40 mg Per Tube BID  . sucralfate  1 g Per Tube TID WC & HS  . zolpidem  10 mg Per Tube QHS   Continuous Infusions: . dextrose 5 % and 0.9% NaCl 100 mL/hr at 08/13/13 1828    Active Problems:   GERD (gastroesophageal reflux disease)   Hypertension   DM (diabetes mellitus), type 2, uncontrolled   Protein-calorie malnutrition, severe   Epigastric pain   Nausea & vomiting   Diabetic gastroparesis    Time spent: 45 minutes    WOODS, CURTIS, J  [facility] Pager [phone]. If 7PM-7AM, please contact night-coverage at [email or web address], password [place] 08/14/2013, 8:23 AM  LOS: 1 day

## 2013-08-15 ENCOUNTER — Inpatient Hospital Stay (HOSPITAL_COMMUNITY): Payer: Medicare Other

## 2013-08-15 DIAGNOSIS — R112 Nausea with vomiting, unspecified: Secondary | ICD-10-CM

## 2013-08-15 LAB — COMPREHENSIVE METABOLIC PANEL
ALT: 15 U/L (ref 0–35)
AST: 17 U/L (ref 0–37)
Albumin: 2.3 g/dL — ABNORMAL LOW (ref 3.5–5.2)
Alkaline Phosphatase: 74 U/L (ref 39–117)
BUN: 16 mg/dL (ref 6–23)
CO2: 22 meq/L (ref 19–32)
CREATININE: 0.84 mg/dL (ref 0.50–1.10)
Calcium: 8 mg/dL — ABNORMAL LOW (ref 8.4–10.5)
Chloride: 100 mEq/L (ref 96–112)
GFR calc non Af Amer: 84 mL/min — ABNORMAL LOW (ref >90)
Glucose, Bld: 223 mg/dL — ABNORMAL HIGH (ref 70–99)
POTASSIUM: 3.4 meq/L — AB (ref 3.7–5.3)
Sodium: 134 mEq/L — ABNORMAL LOW (ref 137–147)
Total Protein: 5.4 g/dL — ABNORMAL LOW (ref 6.0–8.3)

## 2013-08-15 LAB — CBC WITH DIFFERENTIAL/PLATELET
BASOS ABS: 0 10*3/uL (ref 0.0–0.1)
Basophils Relative: 1 % (ref 0–1)
Eosinophils Absolute: 0.2 10*3/uL (ref 0.0–0.7)
Eosinophils Relative: 4 % (ref 0–5)
HCT: 28 % — ABNORMAL LOW (ref 36.0–46.0)
Hemoglobin: 9.5 g/dL — ABNORMAL LOW (ref 12.0–15.0)
LYMPHS PCT: 50 % — AB (ref 12–46)
Lymphs Abs: 2 10*3/uL (ref 0.7–4.0)
MCH: 29.7 pg (ref 26.0–34.0)
MCHC: 33.9 g/dL (ref 30.0–36.0)
MCV: 87.5 fL (ref 78.0–100.0)
Monocytes Absolute: 0.3 10*3/uL (ref 0.1–1.0)
Monocytes Relative: 7 % (ref 3–12)
NEUTROS ABS: 1.6 10*3/uL — AB (ref 1.7–7.7)
NEUTROS PCT: 38 % — AB (ref 43–77)
PLATELETS: 175 10*3/uL (ref 150–400)
RBC: 3.2 MIL/uL — AB (ref 3.87–5.11)
RDW: 14 % (ref 11.5–15.5)
WBC: 4.1 10*3/uL (ref 4.0–10.5)

## 2013-08-15 LAB — GLUCOSE, CAPILLARY
GLUCOSE-CAPILLARY: 147 mg/dL — AB (ref 70–99)
GLUCOSE-CAPILLARY: 174 mg/dL — AB (ref 70–99)
GLUCOSE-CAPILLARY: 203 mg/dL — AB (ref 70–99)
Glucose-Capillary: 203 mg/dL — ABNORMAL HIGH (ref 70–99)
Glucose-Capillary: 188 mg/dL — ABNORMAL HIGH (ref 70–99)
Glucose-Capillary: 133 mg/dL — ABNORMAL HIGH (ref 70–99)

## 2013-08-15 LAB — LIPASE, BLOOD: LIPASE: 13 U/L (ref 11–59)

## 2013-08-15 LAB — RAPID URINE DRUG SCREEN, HOSP PERFORMED
Amphetamines: NOT DETECTED
Barbiturates: NOT DETECTED
Benzodiazepines: NOT DETECTED
Cocaine: NOT DETECTED
OPIATES: POSITIVE — AB
TETRAHYDROCANNABINOL: POSITIVE — AB

## 2013-08-15 LAB — OCCULT BLOOD X 1 CARD TO LAB, STOOL: FECAL OCCULT BLD: NEGATIVE

## 2013-08-15 MED ORDER — IOHEXOL 300 MG/ML  SOLN: 25.0000 mL | INTRAMUSCULAR | Status: DC

## 2013-08-15 MED ORDER — IOHEXOL 300 MG/ML  SOLN
100.0000 mL | Freq: Once | INTRAMUSCULAR | Status: AC | PRN
  Administered 2013-08-15: 100 mL via INTRAVENOUS

## 2013-08-15 NOTE — Progress Notes (Signed)
[facility] PROGRESS NOTE  BRANDE RAKER [id number] DOB: 10/04/1970 DOA: 08/13/2013 PCP: [name], MD  Assessment/Plan:  Epigastric pain/Diabetic gastroparesis   -Normal lipase normal liver enzymes. At this point suspect diabetic gastroparesis as cause.  -Patient has acutely stopped her pain regimen on 08/03/2013. Although 10 days since patient had been on large dose narcotics (per patient) for an extended period time the bowel  May not have yet recovered which could be contributing to her gastroparesis.  -Will continue n.p.o. until GI decides if they will conduct EGD; patient has never been seen in [place] [facility]. Had been seen by a gastroenterologist in [place] (name unknown) -Supportive therapy with pain medication and anti-emetics. Of note patient reports being allergic to Zofran and Compazine  - Acute abdominal series obtained; see results below  -Will switch patient's Reglan from by mouth to IV -Consulted ([facility]) doctor schooler [phone]; await his recommendations -UDS; positive for marijuana (patient states sister gave her cookies with marijuana and she did not notice), positive for opiates -Obtain occult blood card/Gastroccult card  -Abdominal CT per GI pending -Change diet to n.p.o.   Hypertension  - Patient on labetalol and lisinopril for BP; within ADA guidelines  - Continue maintenance IV fluids   Protein calorie malnutrition  - Continue home regimen and most likely secondary to principal problems listed above   Diabetes mellitus type 2  - Will decrease home Lantus dose by half and place on sliding scale insulin  - Routine CBG monitoring  - Obtain hemoglobin A1c   GERD  - Continue IV Protonix to 40 mg BID.   Pain control -Again explained to patient why we shouldn't increase her narcotic medication -See gastroparesis     Code Status: Full Family Communication:  Disposition Plan: Per GI   Consultants: Doctor Michail Sermon [facility]  GI)   Procedures: 08/14/2013 dual lumen power injectable PICC line to right brachial vein.  Acute abdominal series 08/13/2013 No acute finding    Antibiotics:    HPI/Subjective: [name] is a 43 y.o. Female BF PMHx DRUG SEEKING BEHAVIOR (outside physician prescribes liquid methadone per PEG), Hx chronic pancreatitis, diabetes on insulin therapy, diabetic gastroparesis, S/P G-tube placement followed by Dr. Lacinda Axon and Adrian Blackwater, HTN, HLD, chronic leukocytosis. ,. She is presenting to the ED complaining of the above complaints which started since yesterday. The problems have been persistent since onset. Nothing she is aware of other than pain medicine and antiemetics here help make it better. Otherwise food makes it worse. Patient denies any fevers or blood per rectum. Patient also states that she has been having nausea and has vomited throughout the night.  While in the ED patient was found to have an elevated white blood cell count of 12.6, sodium level on 134, chloride level of 93, hyperglycemia of 286 on BMP, with normal lipase and liver enzymes. We were subsequently consult at for pain management as well as further evaluation and recommendations. While in the ED she was given Dilaudid for her abdominal discomfort. Because of recent CT scan of her abdomen which reportedly was unrevealing per my discussion with the ED physician he decided not to repeat CT scan 08/14/2013 review of patient's labs on admission show that lipase within normal limit, glucose elevated to 267, CBC mildly elevated without left shift or bands. States that starting 2 days ago  throwing up frank blood. Also states after discharge from hospital on 08/03/2013 she decided she needed to stop her pain medications so stopped methadone and other  narcotics on her own. Currently rates abdominal pain at 8/10LUQ/LLQ> and her of abdomen. 08/15/2013 states pain decreased, has had bowel movement, however still continues to have abdominal  discomfort which she believes she needs increased narcotic medication for her. States had some nausea when she tried liquid diet. States has not been taken down for abdominal CT yet.    Objective: Filed Vitals:   08/14/13 1429 08/14/13 2115 08/15/13 0510 08/15/13 0824  BP: 142/98 95/69 107/68   Pulse: 94 102 86   Temp: 99 F (37.2 C) 98.2 F (36.8 C) 97.9 F (36.6 C)   TempSrc: Oral Oral Oral   Resp: 18 16 16    Height:      Weight:      SpO2: 100% 100% 100% 99%    Intake/Output Summary (Last 24 hours) at 08/15/13 1529 Last data filed at 08/15/13 1400  Gross per 24 hour  Intake    507 ml  Output   1050 ml  Net   -543 ml   Filed Weights   08/13/13 1815 08/14/13 0540  Weight: 64.411 kg (142 lb) 65.454 kg (144 lb 4.8 oz)    Exam:   General:  A./O. x4, moderate pain abdominal rate at 8/10  Cardiovascular: Regular rhythm and rate, negative murmurs rubs gallops, DP/PT pulse +2  Respiratory: Clear to auscultation bilateral  Abdomen: Diffuse abdominal pain LUQ/LLQ> RLQ/LUQ  Musculoskeletal: Negative pedal edema, negative cyanosis   Data Reviewed: Basic Metabolic Panel:  Recent Labs Lab 08/13/13 0950 08/13/13 1909 08/15/13 0540  NA 134*  --  134*  K 4.0  --  3.4*  CL 93*  --  100  CO2 25  --  22  GLUCOSE 267*  --  223*  BUN 18  --  16  CREATININE 0.62  --  0.84  CALCIUM 9.5  --  8.0*  MG  --  1.5  --   PHOS  --  2.8  --    Liver Function Tests:  Recent Labs Lab 08/13/13 0950 08/15/13 0540  AST 21 17  ALT 16 15  ALKPHOS 120* 74  BILITOT 0.2* <0.2*  PROT 8.6* 5.4*  ALBUMIN 3.6 2.3*    Recent Labs Lab 08/13/13 0950 08/15/13 0540  LIPASE 16 13   No results found for this basename: AMMONIA,  in the last 168 hours CBC:  Recent Labs Lab 08/13/13 0950 08/15/13 0540  WBC 12.6* 4.1  NEUTROABS 9.1* 1.6*  HGB 14.0 9.5*  HCT 39.5 28.0*  MCV 85.3 87.5  PLT 353 175   Cardiac Enzymes: No results found for this basename: CKTOTAL, CKMB,  CKMBINDEX, TROPONINI,  in the last 168 hours BNP (last 3 results) No results found for this basename: PROBNP,  in the last 8760 hours CBG:  Recent Labs Lab 08/14/13 2013 08/15/13 0015 08/15/13 0408 08/15/13 0750 08/15/13 1158  GLUCAP 193* 203* 203* 174* 147*    Recent Results (from the past 240 hour(s))  URINE CULTURE     Status: None   Collection Time    08/13/13  1:51 PM      Result Value Range Status   Specimen Description URINE, CLEAN CATCH   Final   Special Requests NONE   Final   Culture  Setup Time     Final   Value: 08/13/2013 23:17     Performed at [place]     Final   Value: NO GROWTH     Performed at [facility]  Culture     Final   Value: NO GROWTH     Performed at [facility]   Report Status 08/14/2013 FINAL   Final     Studies: Dg Abd 1 View  08/13/2013   CLINICAL DATA:  Abdominal pain, nausea and vomiting.  EXAM: ABDOMEN - 1 VIEW  COMPARISON:  Chest in two views abdomen 08/01/2013.  FINDINGS: The bowel gas pattern is unremarkable. No abnormal abdominal calcification is seen. Feeding tube is noted. Cholecystectomy clips and postoperative change of lower lumbar fusion also noted  IMPRESSION: No acute finding.   Electronically Signed   By: Inge Rise M.D.   On: 08/13/2013 17:55   Ir Fluoro Guide Cv Line Right  08/14/2013   CLINICAL DATA:  Poor venous access, request for image guided PICC placement.  EXAM: DUAL LUMEN POWER INJECTABLE PICC LINE PLACEMENT WITH ULTRASOUND AND FLUOROSCOPIC GUIDANCE  FLUOROSCOPY TIME:  0.3 minutes  PROCEDURE: The patient was advised of the possible risks and complications and agreed to undergo the procedure. The patient was then brought to the angiographic suite for the procedure.  The right arm was prepped with chlorhexidine, draped in the usual sterile fashion using maximum barrier technique (cap and mask, sterile gown, sterile gloves, large sterile sheet, hand hygiene and cutaneous  antisepsis) and infiltrated locally with 1% Lidocaine.  Ultrasound demonstrated patency of the right brachial vein, and this was documented with an image. Under real-time ultrasound guidance, this vein was accessed with a 21 gauge micropuncture needle and image documentation was performed. A 0.018 wire was introduced in to the vein. Over this, a 5 [place] dual lumen power-injectable PICC was advanced to the lower SVC/right atrial junction. Fluoroscopy during the procedure and fluoro spot radiograph confirms appropriate catheter position. The catheter was flushed and covered with a sterile dressing.  Complications: none.  IMPRESSION: Successful right arm Power injectable PICC line placement with ultrasound and fluoroscopic guidance. The catheter is ready for use.  Read By:  Tsosie Billing PA-C   Electronically Signed   By: Markus Daft M.D.   On: 08/14/2013 16:07   Ir US Guide Vasc Access Right  08/14/2013   CLINICAL DATA:  Poor venous access, request for image guided PICC placement.  EXAM: DUAL LUMEN POWER INJECTABLE PICC LINE PLACEMENT WITH ULTRASOUND AND FLUOROSCOPIC GUIDANCE  FLUOROSCOPY TIME:  0.3 minutes  PROCEDURE: The patient was advised of the possible risks and complications and agreed to undergo the procedure. The patient was then brought to the angiographic suite for the procedure.  The right arm was prepped with chlorhexidine, draped in the usual sterile fashion using maximum barrier technique (cap and mask, sterile gown, sterile gloves, large sterile sheet, hand hygiene and cutaneous antisepsis) and infiltrated locally with 1% Lidocaine.  Ultrasound demonstrated patency of the right brachial vein, and this was documented with an image. Under real-time ultrasound guidance, this vein was accessed with a 21 gauge micropuncture needle and image documentation was performed. A 0.018 wire was introduced in to the vein. Over this, a 5 [place] dual lumen power-injectable PICC was advanced to the lower SVC/right  atrial junction. Fluoroscopy during the procedure and fluoro spot radiograph confirms appropriate catheter position. The catheter was flushed and covered with a sterile dressing.  Complications: none.  IMPRESSION: Successful right arm Power injectable PICC line placement with ultrasound and fluoroscopic guidance. The catheter is ready for use.  Read By:  Tsosie Billing PA-C   Electronically Signed   By: Scherrie Gerlach.D.  On: 08/14/2013 16:07    Scheduled Meds: . acetaminophen  650 mg Per Tube TID  . atorvastatin  40 mg Per Tube q1800  . busPIRone  5 mg Per Tube TID  . feeding supplement (GLUCERNA SHAKE)  237 mL Oral BID BM  . feeding supplement (VITAL AF 1.2 CAL)  237 mL Per Tube Q3H while awake  . gabapentin  600 mg Per Tube Q8H  . heparin  5,000 Units Subcutaneous Q8H  . insulin aspart  0-15 Units Subcutaneous TID WC  . insulin aspart  0-5 Units Subcutaneous QHS  . insulin glargine  7 Units Subcutaneous QHS  . labetalol  100 mg Per Tube BID  . lisinopril  10 mg Per Tube Daily  . magnesium oxide  400 mg Per Tube Daily  . metoCLOPramide (REGLAN) injection  10 mg Intravenous TID  . mirtazapine  15 mg Per Tube QHS  . mometasone-formoterol  2 puff Inhalation BID  . multivitamin  5 mL Per Tube Daily  . pantoprazole (PROTONIX) IV  40 mg Intravenous Q12H  . sucralfate  1 g Per Tube TID WC & HS  . zolpidem  10 mg Per Tube QHS   Continuous Infusions: . dextrose 5 % and 0.9% NaCl 100 mL/hr at 08/15/13 0023    Active Problems:   GERD (gastroesophageal reflux disease)   Hypertension   DM (diabetes mellitus), type 2, uncontrolled   Protein-calorie malnutrition, severe   Epigastric pain   Nausea & vomiting   Diabetic gastroparesis    Time spent: 45 minutes    Nur Rabold, J  [facility] Pager [phone]. If 7PM-7AM, please contact night-coverage at [email or web address], password [facility] 08/15/2013, 3:29 PM  LOS: 2 days

## 2013-08-15 NOTE — Progress Notes (Signed)
Patient ID: AAKRITI GOANS, female   DOB: 22-Oct-1970, 43 y.o.   MRN: [id number] [facility] Gastroenterology Progress Note  YASMEEN EGERER 43 y.o. 11-Jan-1971   Subjective: Reports gagging today. Passed green stool. Denies hematemesis or melena/hematochezia.  Objective: Vital signs in last 24 hours: Filed Vitals:   08/15/13 0510  BP: 107/68  Pulse: 86  Temp: 97.9 F (36.6 C)  Resp: 16    Physical Exam: Gen: lethargic, uncomfortable Abd: diffusely tender with guarding, high-pitched bowel sounds  Lab Results:  Recent Labs  08/13/13 0950 08/13/13 1909 08/15/13 0540  NA 134*  --  134*  K 4.0  --  3.4*  CL 93*  --  100  CO2 25  --  22  GLUCOSE 267*  --  223*  BUN 18  --  16  CREATININE 0.62  --  0.84  CALCIUM 9.5  --  8.0*  MG  --  1.5  --   PHOS  --  2.8  --     Recent Labs  08/13/13 0950 08/15/13 0540  AST 21 17  ALT 16 15  ALKPHOS 120* 74  BILITOT 0.2* <0.2*  PROT 8.6* 5.4*  ALBUMIN 3.6 2.3*    Recent Labs  08/13/13 0950 08/15/13 0540  WBC 12.6* 4.1  NEUTROABS 9.1* 1.6*  HGB 14.0 9.5*  HCT 39.5 28.0*  MCV 85.3 87.5  PLT 353 175   No results found for this basename: LABPROT, INR,  in the last 72 hours    Assessment/Plan: 43 yo with gastroparesis who has chronic abdominal pain and had episodes of hematemesis at home without any recurrence here. She started dry heaving after my exam. Due to her high-pitched bowel sounds will get a CT scan to further evaluate. Would not recommend an EGD at this time. Continue supportive care.   Fenton C. 08/15/2013, 3:09 PM

## 2013-08-16 DIAGNOSIS — K920 Hematemesis: Secondary | ICD-10-CM

## 2013-08-16 DIAGNOSIS — Z431 Encounter for attention to gastrostomy: Secondary | ICD-10-CM

## 2013-08-16 DIAGNOSIS — E785 Hyperlipidemia, unspecified: Secondary | ICD-10-CM

## 2013-08-16 DIAGNOSIS — E876 Hypokalemia: Secondary | ICD-10-CM

## 2013-08-16 LAB — CBC WITH DIFFERENTIAL/PLATELET
Basophils Absolute: 0 10*3/uL (ref 0.0–0.1)
Basophils Relative: 1 % (ref 0–1)
EOS ABS: 0.2 10*3/uL (ref 0.0–0.7)
Eosinophils Relative: 4 % (ref 0–5)
HCT: 28.6 % — ABNORMAL LOW (ref 36.0–46.0)
Hemoglobin: 9.8 g/dL — ABNORMAL LOW (ref 12.0–15.0)
Lymphocytes Relative: 51 % — ABNORMAL HIGH (ref 12–46)
Lymphs Abs: 2 10*3/uL (ref 0.7–4.0)
MCH: 30 pg (ref 26.0–34.0)
MCHC: 34.3 g/dL (ref 30.0–36.0)
MCV: 87.5 fL (ref 78.0–100.0)
Monocytes Absolute: 0.3 10*3/uL (ref 0.1–1.0)
Monocytes Relative: 8 % (ref 3–12)
Neutro Abs: 1.4 10*3/uL — ABNORMAL LOW (ref 1.7–7.7)
Neutrophils Relative %: 36 % — ABNORMAL LOW (ref 43–77)
PLATELETS: 174 10*3/uL (ref 150–400)
RBC: 3.27 MIL/uL — ABNORMAL LOW (ref 3.87–5.11)
RDW: 14 % (ref 11.5–15.5)
WBC: 3.9 10*3/uL — ABNORMAL LOW (ref 4.0–10.5)

## 2013-08-16 LAB — COMPREHENSIVE METABOLIC PANEL
ALBUMIN: 2.7 g/dL — AB (ref 3.5–5.2)
ALT: 16 U/L (ref 0–35)
AST: 16 U/L (ref 0–37)
Alkaline Phosphatase: 83 U/L (ref 39–117)
BUN: 6 mg/dL (ref 6–23)
CO2: 22 mEq/L (ref 19–32)
Calcium: 8.3 mg/dL — ABNORMAL LOW (ref 8.4–10.5)
Chloride: 106 mEq/L (ref 96–112)
Creatinine, Ser: 0.58 mg/dL (ref 0.50–1.10)
GFR calc Af Amer: >90 mL/min (ref >90)
GFR calc non Af Amer: >90 mL/min (ref >90)
Glucose, Bld: 141 mg/dL — ABNORMAL HIGH (ref 70–99)
POTASSIUM: 3.4 meq/L — AB (ref 3.7–5.3)
Sodium: 141 mEq/L (ref 137–147)
Total Bilirubin: <0.2 mg/dL — ABNORMAL LOW (ref 0.3–1.2)
Total Protein: 6 g/dL (ref 6.0–8.3)

## 2013-08-16 LAB — GLUCOSE, CAPILLARY
GLUCOSE-CAPILLARY: 162 mg/dL — AB (ref 70–99)
Glucose-Capillary: 152 mg/dL — ABNORMAL HIGH (ref 70–99)
Glucose-Capillary: 129 mg/dL — ABNORMAL HIGH (ref 70–99)
Glucose-Capillary: 97 mg/dL (ref 70–99)
Glucose-Capillary: 164 mg/dL — ABNORMAL HIGH (ref 70–99)
Glucose-Capillary: 76 mg/dL (ref 70–99)

## 2013-08-16 LAB — LIPASE, BLOOD: LIPASE: 12 U/L (ref 11–59)

## 2013-08-16 MED ORDER — VITAL AF 1.2 CAL PO LIQD
1000.0000 mL | ORAL | Status: DC
  Administered 2013-08-16 – 2013-08-18 (×3): 1000 mL
  Filled 2013-08-16 (×5): qty 1000

## 2013-08-16 NOTE — Progress Notes (Signed)
Patient ID: [name], female   DOB: June 22, 1971, 43 y.o.   MRN: [id number] [facility] Gastroenterology Progress Note  [name] 43 y.o. 04/07/1971   Subjective: Lying in bed complaining of nausea. Green stool overnight. Nurse in room. Complaining of ongoing abdominal pain.   Objective: Vital signs: Filed Vitals:   08/16/13 0859  BP: 154/82  Pulse: 89  Temp: 97.8  Resp: 18    Physical Exam: Gen: lethargic, no acute distress Abd: diffusely tender with guarding, nondistended, +BS  Lab Results:  Recent Labs  08/13/13 1909 08/15/13 0540 08/16/13 0530  NA  --  134* 141  K  --  3.4* 3.4*  CL  --  100 106  CO2  --  22 22  GLUCOSE  --  223* 141*  BUN  --  16 6  CREATININE  --  0.84 0.58  CALCIUM  --  8.0* 8.3*  MG 1.5  --   --   PHOS 2.8  --   --     Recent Labs  08/15/13 0540 08/16/13 0530  AST 17 16  ALT 15 16  ALKPHOS 74 83  BILITOT <0.2* <0.2*  PROT 5.4* 6.0  ALBUMIN 2.3* 2.7*    Recent Labs  08/15/13 0540 08/16/13 0530  WBC 4.1 3.9*  NEUTROABS 1.6* 1.4*  HGB 9.5* 9.8*  HCT 28.0* 28.6*  MCV 87.5 87.5  PLT 175 174      Assessment/Plan: 43 yo with gastroparesis who has chronic abdominal pain and had episodes of hematemesis at home without any recurrence here. Hgb stable. CT negative for acute findings. Patient not willing to try anything by mouth except small amounts of clears. PEG tube intact. Would not recommend an endoscopy. No further recs from a GI standpoint. Continue supportive care. Will sign off. Call if questions. Dr. Cristina Gong available to see over weekend if necessary. F/U with GI prn.   Vilas C. 08/16/2013, 11:15 AM

## 2013-08-16 NOTE — Progress Notes (Addendum)
[facility] PROGRESS NOTE  [name] [id number] DOB: [birth year] DOA: 08/13/2013 PCP: [name], MD  Assessment/Plan:  Epigastric pain/Diabetic gastroparesis   -Normal lipase normal liver enzymes. At this point suspect diabetic gastroparesis as cause.  -Patient has acutely stopped her pain regimen on 08/03/2013. Although 10 days since patient had been on large dose narcotics (per patient) for an extended period time the bowel  May not have yet recovered which could be contributing to her gastroparesis.  -Pt able to tolerate sips of Ginger Ale and Tube feedings (Vital AF 1.2 @ 20 ml/hr), advanced to clear liquids in the a.m. 1/17; reviewed critically menu with patient's and gave recommendations on what she should order.   -Of note patient reports being allergic to Zofran and Compazine  - Acute abdominal series obtained; see results below  -Continue Reglan IV -Consulted ([facility]) doctor schooler [phone]; recommendations; does not recommend endoscopy -UDS; positive for marijuana (patient states sister gave her cookies with marijuana and she did not notice), positive for opiates -Obtain occult blood card 1/15; negative -Gastroccult card; needs to be collected  -Abdominal CT per GI;  see results below   Hypertension  - Patient on labetalol and lisinopril for BP; within ADA guidelines  - Continue maintenance IV fluids   Protein calorie malnutrition  - Continue home regimen and most likely secondary to principal problems listed above   Diabetes mellitus type 2  - Will decrease home Lantus dose by half and place on sliding scale insulin  - Routine CBG monitoring  - Obtain hemoglobin A1c   GERD  - Continue IV Protonix to 40 mg BID.   Pain control -Again explained to patient why we shouldn't increase her narcotic medication -See gastroparesis     Code Status: Full Family Communication:  Disposition Plan: Per GI   Consultants: Doctor Michail Sermon [facility]  GI)   Procedures: CT abdomen pelvis with contrast 08/15/2013 1. No acute process in the abdomen or pelvis.  2. Fluid within the colon, suggesting a diarrheal state.  3. Distal esophageal wall thickening is chronic and suspicious for  esophagitis.  4. Urinary bladder wall thickening, suspicious for a component of  bladder outlet obstruction. Cystitis felt less likely, given absence  of surrounding edema.  5. Gastrostomy tube looped in stomach with tip at proximal duodenum.  08/14/2013 dual lumen power injectable PICC line to right brachial vein.  Acute abdominal series 08/13/2013 No acute finding    Antibiotics:    HPI/Subjective: [name] is a 43 y.o. Female BF PMHx DRUG SEEKING BEHAVIOR (outside physician prescribes liquid methadone per PEG), Hx chronic pancreatitis, diabetes on insulin therapy, diabetic gastroparesis, S/P G-tube placement followed by Dr. Lacinda [name] and [name], HTN, HLD, chronic leukocytosis. ,. She is presenting to the ED complaining of the above complaints which started since yesterday. The problems have been persistent since onset. Nothing she is aware of other than pain medicine and antiemetics here help make it better. Otherwise food makes it worse. Patient denies any fevers or blood per rectum. Patient also states that she has been having nausea and has vomited throughout the night.  While in the ED patient was found to have an elevated white blood cell count of 12.6, sodium level on 134, chloride level of 93, hyperglycemia of 286 on BMP, with normal lipase and liver enzymes. We were subsequently consult at for pain management as well as further evaluation and recommendations. While in the ED she was given Dilaudid for her abdominal discomfort. Because of recent  CT scan of her abdomen which reportedly was unrevealing per my discussion with the ED physician he decided not to repeat CT scan 08/14/2013 review of patient's labs on admission show that lipase within normal  limit, glucose elevated to 267, CBC mildly elevated without left shift or bands. States that starting 2 days ago  throwing up frank blood. Also states after discharge from hospital on 08/03/2013 she decided she needed to stop her pain medications so stopped methadone and other narcotics on her own. Currently rates abdominal pain at 8/10LUQ/LLQ> and her of abdomen. 08/15/2013 states pain decreased, has had bowel movement, however still continues to have abdominal discomfort which she believes she needs increased narcotic medication for her. States had some nausea when she tried liquid diet. States has not been taken down for abdominal CT yet. 08/16/2013 patient feeling much improved. Able to tolerate tube feedings. States now is hungry. Having some abdominal pain but greatly reduced.    Objective: Filed Vitals:   08/16/13 0859 08/16/13 1400 08/16/13 1916 08/16/13 2040  BP:  120/82  142/94  Pulse: 89 86  89  Temp:  98.5 F (36.9 C)  97.3 F (36.3 C)  TempSrc:  Oral  Axillary  Resp: 18 16  16   Height:      Weight:      SpO2: 100% 100% 98% 99%    Intake/Output Summary (Last 24 hours) at 08/16/13 2135 Last data filed at 08/16/13 1720  Gross per 24 hour  Intake    480 ml  Output   3100 ml  Net  -2620 ml   Filed Weights   08/13/13 1815 08/14/13 0540  Weight: 64.411 kg (142 lb) 65.454 kg (144 lb 4.8 oz)    Exam:   General:  A./O. x4, moderate pain abdominal rate at 8/10  Cardiovascular: Regular rhythm and rate, negative murmurs rubs gallops, DP/PT pulse +2  Respiratory: Clear to auscultation bilateral  Abdomen: Diffuse abdominal pain LUQ/LLQ> RLQ/LUQ (reduced from 1/14)  Musculoskeletal: Negative pedal edema, negative cyanosis   Data Reviewed: Basic Metabolic Panel:  Recent Labs Lab 08/13/13 0950 08/13/13 1909 08/15/13 0540 08/16/13 0530  NA 134*  --  134* 141  K 4.0  --  3.4* 3.4*  CL 93*  --  100 106  CO2 25  --  22 22  GLUCOSE 267*  --  223* 141*  BUN 18  --  16 6   CREATININE 0.62  --  0.84 0.58  CALCIUM 9.5  --  8.0* 8.3*  MG  --  1.5  --   --   PHOS  --  2.8  --   --    Liver Function Tests:  Recent Labs Lab 08/13/13 0950 08/15/13 0540 08/16/13 0530  AST 21 17 16   ALT 16 15 16   ALKPHOS 120* 74 83  BILITOT 0.2* <0.2* <0.2*  PROT 8.6* 5.4* 6.0  ALBUMIN 3.6 2.3* 2.7*    Recent Labs Lab 08/13/13 0950 08/15/13 0540 08/16/13 0530  LIPASE 16 13 12    No results found for this basename: AMMONIA,  in the last 168 hours CBC:  Recent Labs Lab 08/13/13 0950 08/15/13 0540 08/16/13 0530  WBC 12.6* 4.1 3.9*  NEUTROABS 9.1* 1.6* 1.4*  HGB 14.0 9.5* 9.8*  HCT 39.5 28.0* 28.6*  MCV 85.3 87.5 87.5  PLT 353 175 174   Cardiac Enzymes: No results found for this basename: CKTOTAL, CKMB, CKMBINDEX, TROPONINI,  in the last 168 hours BNP (last 3 results) No results found for this  basename: PROBNP,  in the last 8760 hours CBG:  Recent Labs Lab 08/16/13 0429 08/16/13 0802 08/16/13 1150 08/16/13 1713 08/16/13 2037  GLUCAP 152* 129* 97 164* 76    Recent Results (from the past 240 hour(s))  URINE CULTURE     Status: None   Collection Time    08/13/13  1:51 PM      Result Value Range Status   Specimen Description URINE, CLEAN CATCH   Final   Special Requests NONE   Final   Culture  Setup Time     Final   Value: 08/13/2013 23:17     Performed at [facility]     Final   Value: NO GROWTH     Performed at [facility]   Culture     Final   Value: NO GROWTH     Performed at [facility]   Report Status 08/14/2013 FINAL   Final     Studies: Ct Abdomen Pelvis W Contrast  08/15/2013   CLINICAL DATA:  Abdominal pain for 2 weeks. Nausea and vomiting. Gastroesophageal reflux disease.  EXAM: CT ABDOMEN AND PELVIS WITH CONTRAST  TECHNIQUE: Multidetector CT imaging of the abdomen and pelvis was performed using the standard protocol following bolus administration of intravenous contrast.  CONTRAST:   161mL OMNIPAQUE IOHEXOL 300 MG/ML  SOLN  COMPARISON:  DG ABDOMEN 1V dated 08/13/2013; CT ABD/PELVIS W CM dated 07/30/2013; CT ABD/PELVIS W CM dated 05/27/2013; CT ABD/PELV WO CM dated 02/06/2013  FINDINGS: Lower Chest: Clear lung bases. Normal heart size without pericardial or pleural effusion. Stents in the LAD and a diagonal branch. Incompletely imaged central line. Distal esophageal wall thickening.  Abdomen/Pelvis: Normal liver, spleen. A gastrostomy tube is identified. This is looped in the stomach, with the tip in the proximal duodenum. Stomach is partially decompressed.  Normal pancreas. Cholecystectomy, without biliary ductal dilatation. Normal adrenal glands and kidneys  Advanced aortic atherosclerosis for age. No retroperitoneal or retrocrural adenopathy. Portions of the colon are fluid-filled, suggesting a diarrheal state. Normal terminal ileum. Surgical sutures likely identified within the jejunum. No bowel obstruction or ascites. No pneumatosis or free intraperitoneal air.  No pelvic adenopathy. Mildly thick-walled urinary bladder. Hysterectomy. No adnexal mass or significant free fluid.  Bones/Musculoskeletal: Osteopenia. Prior lumbar laminectomies. L4-5 fixation. Mild wedging of the T11 vertebral body which is chronic.  IMPRESSION: 1.  No acute process in the abdomen or pelvis. 2. Fluid within the colon, suggesting a diarrheal state. 3. Distal esophageal wall thickening is chronic and suspicious for esophagitis. 4. Urinary bladder wall thickening, suspicious for a component of bladder outlet obstruction. Cystitis felt less likely, given absence of surrounding edema. 5. Gastrostomy tube looped in stomach with tip at proximal duodenum.   Electronically Signed   By: Abigail Miyamoto M.D.   On: 08/15/2013 18:51    Scheduled Meds: . acetaminophen  650 mg Per Tube TID  . atorvastatin  40 mg Per Tube q1800  . busPIRone  5 mg Per Tube TID  . gabapentin  600 mg Per Tube Q8H  . heparin  5,000 Units  Subcutaneous Q8H  . insulin aspart  0-15 Units Subcutaneous TID WC  . insulin aspart  0-5 Units Subcutaneous QHS  . insulin glargine  7 Units Subcutaneous QHS  . labetalol  100 mg Per Tube BID  . lisinopril  10 mg Per Tube Daily  . magnesium oxide  400 mg Per Tube Daily  . metoCLOPramide (REGLAN) injection  10 mg Intravenous TID  . mirtazapine  15 mg Per Tube QHS  . mometasone-formoterol  2 puff Inhalation BID  . multivitamin  5 mL Per Tube Daily  . pantoprazole (PROTONIX) IV  40 mg Intravenous Q12H  . sucralfate  1 g Per Tube TID WC & HS  . zolpidem  10 mg Per Tube QHS   Continuous Infusions: . dextrose 5 % and 0.9% NaCl 100 mL/hr at 08/16/13 1508  . feeding supplement (VITAL AF 1.2 CAL) 1,000 mL (08/16/13 1526)    Active Problems:   GERD (gastroesophageal reflux disease)   Hypertension   DM (diabetes mellitus), type 2, uncontrolled   Protein-calorie malnutrition, severe   Epigastric pain   Nausea & vomiting   Diabetic gastroparesis    Time spent: 45 minutes    Monicia Tse, J  [facility] Pager [phone]. If 7PM-7AM, please contact night-coverage at [email or web address], password [facility] 08/16/2013, 9:35 PM  LOS: 3 days

## 2013-08-16 NOTE — Progress Notes (Addendum)
NUTRITION FOLLOW UP  Intervention:   - Initiate Vital AF 1.2 @ 20 ml/hr via PEG and increase by 10 ml every 8 hours to goal rate of 55 ml/hr.  At goal rate, tube feeding regimen will provide 1584 kcal, 99 grams of protein, and 1071 ml of H2O.  -Consider PEJ placement d/t pt's gastroparesis and continued inability to tolerate TF   Nutrition Dx:   Inadequate oral intake related to poor appetite, nausea as evidenced by 0% meal intake.    Goal:   1. Resolution of nausea 2.  Pt to meet >/= 90% of their estimated nutrition needs via PO intake and TF   Monitor:   Weights, labs, intake, nausea, TF    Assessment:   1/14: Met with pt who reports she was using TF via PEG of Osmolite 1.5 at 19ml/hr x 14 hours at night and eating 1 meal/day - typically some pretzels and ginger-ale. Says she has been eating this way for the past week. Thinks she has lost 5 pounds recently - weight trend shows pt's weight down 5 pounds in the past 2 weeks. C/o nausea today but no vomiting, only dry heaves. Current TF ordered is 1 can of Vital AF 1.2 every 3 hours while awake, however pt refused morning can. Home TF of Osmolite 1.5 at 82ml/hr x 14 hours provides - 1260 calories, 53g protein, 620ml free water and was meeting 79% estimated calorie needs, 66% estimated protein needs  1/16:  -Pt continues to have nausea and gagging. One episode of green stools. MD ordered pt to be NPO -Reglan to be administered IV per MD -Per discussion with RN, pt has been unable to tolerate Vital AF 1.2 bolus feeds since admission d/t gastroparesis and nausea. Feels full even after taking pills -Pt may benefit from modifying TF regimen to continuous feeds. Pt reported that Osmolite 1.5 felt "heavy" and requested to continue with Vital AF 1.2 tube feed. Agreed to trial continuous feeds. Relayed to RN. Will recommend gradual advancement of TF to goal rate. -Uncontrolled DM2. Recommend Glucerna shakes BID and Carb Modified diet as diet  advancement tolerated  Height: Ht Readings from Last 1 Encounters:  08/13/13 $RemoveB'5\' 4"'SPOaviXf$  (1.626 m)    Weight Status:   Wt Readings from Last 1 Encounters:  08/14/13 144 lb 4.8 oz (65.454 kg)    Re-estimated needs:  Kcal: 1600-1800  Protein: 80-95 gram  Fluid: 2100 ml//daily   Skin: WDL  Diet Order: NPO   Intake/Output Summary (Last 24 hours) at 08/16/13 1215 Last data filed at 08/16/13 1150  Gross per 24 hour  Intake    987 ml  Output   2625 ml  Net  -1638 ml    Last BM: 1/14   Labs:   Recent Labs Lab 08/13/13 0950 08/13/13 1909 08/15/13 0540 08/16/13 0530  NA 134*  --  134* 141  K 4.0  --  3.4* 3.4*  CL 93*  --  100 106  CO2 25  --  22 22  BUN 18  --  16 6  CREATININE 0.62  --  0.84 0.58  CALCIUM 9.5  --  8.0* 8.3*  MG  --  1.5  --   --   PHOS  --  2.8  --   --   GLUCOSE 267*  --  223* 141*    CBG (last 3)   Recent Labs  08/16/13 0429 08/16/13 0802 08/16/13 1150  GLUCAP 152* 129* 97    Scheduled Meds: .  acetaminophen  650 mg Per Tube TID  . atorvastatin  40 mg Per Tube q1800  . busPIRone  5 mg Per Tube TID  . feeding supplement (GLUCERNA SHAKE)  237 mL Oral BID BM  . feeding supplement (VITAL AF 1.2 CAL)  237 mL Per Tube Q3H while awake  . gabapentin  600 mg Per Tube Q8H  . heparin  5,000 Units Subcutaneous Q8H  . insulin aspart  0-15 Units Subcutaneous TID WC  . insulin aspart  0-5 Units Subcutaneous QHS  . insulin glargine  7 Units Subcutaneous QHS  . labetalol  100 mg Per Tube BID  . lisinopril  10 mg Per Tube Daily  . magnesium oxide  400 mg Per Tube Daily  . metoCLOPramide (REGLAN) injection  10 mg Intravenous TID  . mirtazapine  15 mg Per Tube QHS  . mometasone-formoterol  2 puff Inhalation BID  . multivitamin  5 mL Per Tube Daily  . pantoprazole (PROTONIX) IV  40 mg Intravenous Q12H  . sucralfate  1 g Per Tube TID WC & HS  . zolpidem  10 mg Per Tube QHS    Continuous Infusions: . dextrose 5 % and 0.9% NaCl 100 mL/hr at  08/16/13 [place] LDN Clinical Dietitian [phone]

## 2013-08-17 LAB — COMPREHENSIVE METABOLIC PANEL
ALBUMIN: 2.3 g/dL — AB (ref 3.5–5.2)
ALK PHOS: 90 U/L (ref 39–117)
ALT: 15 U/L (ref 0–35)
AST: 19 U/L (ref 0–37)
BUN: 5 mg/dL — ABNORMAL LOW (ref 6–23)
CO2: 21 mEq/L (ref 19–32)
Calcium: 8 mg/dL — ABNORMAL LOW (ref 8.4–10.5)
Chloride: 106 mEq/L (ref 96–112)
Creatinine, Ser: 0.57 mg/dL (ref 0.50–1.10)
GFR calc Af Amer: >90 mL/min (ref >90)
GFR calc non Af Amer: >90 mL/min (ref >90)
GLUCOSE: 250 mg/dL — AB (ref 70–99)
POTASSIUM: 3.4 meq/L — AB (ref 3.7–5.3)
Sodium: 139 mEq/L (ref 137–147)
TOTAL PROTEIN: 5.3 g/dL — AB (ref 6.0–8.3)
Total Bilirubin: <0.2 mg/dL — ABNORMAL LOW (ref 0.3–1.2)

## 2013-08-17 LAB — CBC WITH DIFFERENTIAL/PLATELET
BASOS ABS: 0 10*3/uL (ref 0.0–0.1)
Basophils Relative: 0 % (ref 0–1)
EOS PCT: 5 % (ref 0–5)
Eosinophils Absolute: 0.2 10*3/uL (ref 0.0–0.7)
HEMATOCRIT: 26 % — AB (ref 36.0–46.0)
HEMOGLOBIN: 8.6 g/dL — AB (ref 12.0–15.0)
Lymphocytes Relative: 54 % — ABNORMAL HIGH (ref 12–46)
Lymphs Abs: 1.9 10*3/uL (ref 0.7–4.0)
MCH: 29.5 pg (ref 26.0–34.0)
MCHC: 33.1 g/dL (ref 30.0–36.0)
MCV: 89 fL (ref 78.0–100.0)
MONO ABS: 0.2 10*3/uL (ref 0.1–1.0)
MONOS PCT: 7 % (ref 3–12)
Neutro Abs: 1.2 10*3/uL — ABNORMAL LOW (ref 1.7–7.7)
Neutrophils Relative %: 35 % — ABNORMAL LOW (ref 43–77)
Platelets: 172 10*3/uL (ref 150–400)
RBC: 2.92 MIL/uL — ABNORMAL LOW (ref 3.87–5.11)
RDW: 14.1 % (ref 11.5–15.5)
WBC: 3.5 10*3/uL — ABNORMAL LOW (ref 4.0–10.5)

## 2013-08-17 LAB — GLUCOSE, CAPILLARY
GLUCOSE-CAPILLARY: 112 mg/dL — AB (ref 70–99)
GLUCOSE-CAPILLARY: 140 mg/dL — AB (ref 70–99)
GLUCOSE-CAPILLARY: 175 mg/dL — AB (ref 70–99)
GLUCOSE-CAPILLARY: 180 mg/dL — AB (ref 70–99)
GLUCOSE-CAPILLARY: 242 mg/dL — AB (ref 70–99)
Glucose-Capillary: 193 mg/dL — ABNORMAL HIGH (ref 70–99)

## 2013-08-17 LAB — LIPASE, BLOOD: Lipase: 11 U/L (ref 11–59)

## 2013-08-17 MED ORDER — PANTOPRAZOLE SODIUM 40 MG PO PACK
40.0000 mg | PACK | Freq: Two times a day (BID) | ORAL | Status: DC
  Administered 2013-08-17 – 2013-08-18 (×3): 40 mg
  Filled 2013-08-17 (×6): qty 20

## 2013-08-17 MED ORDER — LABETALOL HCL 200 MG PO TABS
200.0000 mg | ORAL_TABLET | Freq: Two times a day (BID) | ORAL | Status: DC
  Administered 2013-08-17 – 2013-08-18 (×3): 200 mg
  Filled 2013-08-17 (×5): qty 1

## 2013-08-17 NOTE — Progress Notes (Signed)
[facility] PROGRESS NOTE  [name] [id number] DOB: 10-07-1970 DOA: 08/13/2013 PCP: [name], MD  Assessment/Plan:  Epigastric pain/Diabetic gastroparesis   -Normal lipase normal liver enzymes. At this point suspect diabetic gastroparesis as cause.  -Patient has acutely stopped her pain regimen on 08/03/2013. Although 10 days since patient had been on large dose narcotics (per patient) for an extended period time the bowel  May not have yet recovered which could be contributing to her gastroparesis.  -Pt able to tolerate sips of Ginger Ale and Tube feedings (Vital AF 1.2 @ 20 ml/hr), advanced to clear liquids in the a.m. 1/17; reviewed critically menu with patient's and gave recommendations on what she should order.   -Of note patient reports being allergic to Zofran and Compazine  - Acute abdominal series obtained; see results below  -Continue Reglan IV -Consulted ([facility]) doctor schooler [phone]; recommendations; does not recommend endoscopy -UDS; positive for marijuana (patient states sister gave her cookies with marijuana and she did not notice), positive for opiates -Obtain occult blood card 1/15; negative -Gastroccult card; needs to be collected  -Abdominal CT per GI;  see results below -1/17 doing well advance diet to [facility]   Hypertension   - not within ADA guidelines - Inc Labetalol to 200 mg BID -Continue lisinopril  10 mg QD - Continue maintenance IV fluids   Protein calorie malnutrition  - Continue home regimen and most likely secondary to principal problems listed above - Advance diet to [facility] - Per Nutrition Continue Vital AF 1.2 @ 20 ml/hr via PEG and increase by 10 ml every 8 hours to goal rate of 55 ml/hr. At goal rate, tube feeding regimen will provide 1584 kcal, 99 grams of protein, and 1071 ml of H2O.  - Pt was up to 40 ml/hr when stopped at 0730 on 1/17 2dary to Inc pain; Resicdual 42ml   Diabetes mellitus type 2  - Will decrease home Lantus  dose by half and place on sliding scale insulin  - Routine CBG monitoring  - Obtain hemoglobin A1c   GERD  - Continue IV Protonix to 40 mg BID.   Pain control -Again explained to patient why we shouldn't increase her narcotic medication -See gastroparesis     Code Status: Full Family Communication:  Disposition Plan: Per GI   Consultants: Doctor Michail Sermon [facility])   Procedures: CT abdomen pelvis with contrast 08/15/2013 1. No acute process in the abdomen or pelvis.  2. Fluid within the colon, suggesting a diarrheal state.  3. Distal esophageal wall thickening is chronic and suspicious for  esophagitis.  4. Urinary bladder wall thickening, suspicious for a component of  bladder outlet obstruction. Cystitis felt less likely, given absence  of surrounding edema.  5. Gastrostomy tube looped in stomach with tip at proximal duodenum.  08/14/2013 dual lumen power injectable PICC line to right brachial vein.  Acute abdominal series 08/13/2013 No acute finding    Antibiotics:    HPI/Subjective: [name] is a 43 y.o. Female BF PMHx DRUG SEEKING BEHAVIOR (outside physician prescribes liquid methadone per PEG), Hx chronic pancreatitis, diabetes on insulin therapy, diabetic gastroparesis, S/P G-tube placement followed by Dr. Lacinda Axon and Adrian Blackwater, HTN, HLD, chronic leukocytosis. ,. She is presenting to the ED complaining of the above complaints which started since yesterday. The problems have been persistent since onset. Nothing she is aware of other than pain medicine and antiemetics here help make it better. Otherwise food makes it worse. Patient denies any fevers or blood  per rectum. Patient also states that she has been having nausea and has vomited throughout the night.  While in the ED patient was found to have an elevated white blood cell count of 12.6, sodium level on 134, chloride level of 93, hyperglycemia of 286 on BMP, with normal lipase and liver enzymes. We were  subsequently consult at for pain management as well as further evaluation and recommendations. While in the ED she was given Dilaudid for her abdominal discomfort. Because of recent CT scan of her abdomen which reportedly was unrevealing per my discussion with the ED physician he decided not to repeat CT scan 08/14/2013 review of patient's labs on admission show that lipase within normal limit, glucose elevated to 267, CBC mildly elevated without left shift or bands. States that starting 2 days ago  throwing up frank blood. Also states after discharge from hospital on 08/03/2013 she decided she needed to stop her pain medications so stopped methadone and other narcotics on her own. Currently rates abdominal pain at 8/10LUQ/LLQ> and her of abdomen. 08/15/2013 states pain decreased, has had bowel movement, however still continues to have abdominal discomfort which she believes she needs increased narcotic medication for her. States had some nausea when she tried liquid diet. States has not been taken down for abdominal CT yet. 08/16/2013 patient feeling much improved. Able to tolerate tube feedings. States now is hungry. Having some abdominal pain but greatly reduced.    Objective: Filed Vitals:   08/16/13 2040 08/17/13 0410 08/17/13 1019 08/17/13 1400  BP: 142/94 122/78  168/91  Pulse: 89 96  88  Temp: 97.3 F (36.3 C) 97.7 F (36.5 C)  98.2 F (36.8 C)  TempSrc: Axillary Oral  Oral  Resp: 16 18  16   Height:      Weight:      SpO2: 99% 100% 100% 100%    Intake/Output Summary (Last 24 hours) at 08/17/13 1832 Last data filed at 08/17/13 1426  Gross per 24 hour  Intake   2030 ml  Output   1250 ml  Net    780 ml   Filed Weights   08/13/13 1815 08/14/13 0540  Weight: 64.411 kg (142 lb) 65.454 kg (144 lb 4.8 oz)    Exam:   General:  A./O. x4, moderate pain abdominal rate at 8/10  Cardiovascular: Regular rhythm and rate, negative murmurs rubs gallops, DP/PT pulse +2  Respiratory: Clear to  auscultation bilateral  Abdomen: Diffuse abdominal pain LUQ/LLQ> RLQ/LUQ (reduced from 1/14)  Musculoskeletal: Negative pedal edema, negative cyanosis   Data Reviewed: Basic Metabolic Panel:  Recent Labs Lab 08/13/13 0950 08/13/13 1909 08/15/13 0540 08/16/13 0530 08/17/13 0455  NA 134*  --  134* 141 139  K 4.0  --  3.4* 3.4* 3.4*  CL 93*  --  100 106 106  CO2 25  --  22 22 21   GLUCOSE 267*  --  223* 141* 250*  BUN 18  --  16 6 5*  CREATININE 0.62  --  0.84 0.58 0.57  CALCIUM 9.5  --  8.0* 8.3* 8.0*  MG  --  1.5  --   --   --   PHOS  --  2.8  --   --   --    Liver Function Tests:  Recent Labs Lab 08/13/13 0950 08/15/13 0540 08/16/13 0530 08/17/13 0455  AST 21 17 16 19   ALT 16 15 16 15   ALKPHOS 120* 74 83 90  BILITOT 0.2* <0.2* <0.2* <0.2*  PROT  8.6* 5.4* 6.0 5.3*  ALBUMIN 3.6 2.3* 2.7* 2.3*    Recent Labs Lab 08/13/13 0950 08/15/13 0540 08/16/13 0530 08/17/13 0455  LIPASE 16 13 12 11    No results found for this basename: AMMONIA,  in the last 168 hours CBC:  Recent Labs Lab 08/13/13 0950 08/15/13 0540 08/16/13 0530 08/17/13 0455  WBC 12.6* 4.1 3.9* 3.5*  NEUTROABS 9.1* 1.6* 1.4* 1.2*  HGB 14.0 9.5* 9.8* 8.6*  HCT 39.5 28.0* 28.6* 26.0*  MCV 85.3 87.5 87.5 89.0  PLT 353 175 174 172   Cardiac Enzymes: No results found for this basename: CKTOTAL, CKMB, CKMBINDEX, TROPONINI,  in the last 168 hours BNP (last 3 results) No results found for this basename: PROBNP,  in the last 8760 hours CBG:  Recent Labs Lab 08/17/13 0018 08/17/13 0407 08/17/13 0737 08/17/13 1133 08/17/13 1615  GLUCAP 140* 193* 175* 112* 180*    Recent Results (from the past 240 hour(s))  URINE CULTURE     Status: None   Collection Time    08/13/13  1:51 PM      Result Value Range Status   Specimen Description URINE, CLEAN CATCH   Final   Special Requests NONE   Final   Culture  Setup Time     Final   Value: 08/13/2013 23:17     Performed at [place]     Final   Value: NO GROWTH     Performed at [facility]   Culture     Final   Value: NO GROWTH     Performed at [facility]   Report Status 08/14/2013 FINAL   Final     Studies: No results found.  Scheduled Meds: . acetaminophen  650 mg Per Tube TID  . atorvastatin  40 mg Per Tube q1800  . busPIRone  5 mg Per Tube TID  . gabapentin  600 mg Per Tube Q8H  . heparin  5,000 Units Subcutaneous Q8H  . insulin aspart  0-15 Units Subcutaneous TID WC  . insulin aspart  0-5 Units Subcutaneous QHS  . insulin glargine  7 Units Subcutaneous QHS  . labetalol  100 mg Per Tube BID  . lisinopril  10 mg Per Tube Daily  . magnesium oxide  400 mg Per Tube Daily  . metoCLOPramide (REGLAN) injection  10 mg Intravenous TID  . mirtazapine  15 mg Per Tube QHS  . mometasone-formoterol  2 puff Inhalation BID  . multivitamin  5 mL Per Tube Daily  . pantoprazole sodium  40 mg Per Tube BID  . sucralfate  1 g Per Tube TID WC & HS  . zolpidem  10 mg Per Tube QHS   Continuous Infusions: . dextrose 5 % and 0.9% NaCl 100 mL/hr at 08/17/13 1224  . feeding supplement (VITAL AF 1.2 CAL) Stopped (08/17/13 1130)    Active Problems:   GERD (gastroesophageal reflux disease)   Hypertension   DM (diabetes mellitus), type 2, uncontrolled   Protein-calorie malnutrition, severe   Epigastric pain   Nausea & vomiting   Diabetic gastroparesis    Time spent: 45 minutes    Halla Chopp, J  [facility] Pager [phone]. If 7PM-7AM, please contact night-coverage at [email or web address], password [facility] 08/17/2013, 6:32 PM  LOS: 4 days

## 2013-08-17 NOTE — Progress Notes (Signed)
Patient requested to have continuous tube feeding turned off for a period of time due to complaints of pain in abdomen and some nausea.  Gastric residual 10cc. MD notified.

## 2013-08-18 LAB — GLUCOSE, CAPILLARY
GLUCOSE-CAPILLARY: 144 mg/dL — AB (ref 70–99)
GLUCOSE-CAPILLARY: 188 mg/dL — AB (ref 70–99)
GLUCOSE-CAPILLARY: 270 mg/dL — AB (ref 70–99)
Glucose-Capillary: 154 mg/dL — ABNORMAL HIGH (ref 70–99)
Glucose-Capillary: 170 mg/dL — ABNORMAL HIGH (ref 70–99)
Glucose-Capillary: 194 mg/dL — ABNORMAL HIGH (ref 70–99)
Glucose-Capillary: 199 mg/dL — ABNORMAL HIGH (ref 70–99)

## 2013-08-18 LAB — COMPREHENSIVE METABOLIC PANEL
ALK PHOS: 117 U/L (ref 39–117)
ALT: 17 U/L (ref 0–35)
AST: 16 U/L (ref 0–37)
Albumin: 2.5 g/dL — ABNORMAL LOW (ref 3.5–5.2)
BUN: 7 mg/dL (ref 6–23)
CALCIUM: 7.9 mg/dL — AB (ref 8.4–10.5)
CO2: 23 mEq/L (ref 19–32)
Chloride: 104 mEq/L (ref 96–112)
Creatinine, Ser: 0.59 mg/dL (ref 0.50–1.10)
GFR calc Af Amer: >90 mL/min (ref >90)
GFR calc non Af Amer: >90 mL/min (ref >90)
GLUCOSE: 200 mg/dL — AB (ref 70–99)
POTASSIUM: 3.2 meq/L — AB (ref 3.7–5.3)
Sodium: 139 mEq/L (ref 137–147)
TOTAL PROTEIN: 5.6 g/dL — AB (ref 6.0–8.3)
Total Bilirubin: <0.2 mg/dL — ABNORMAL LOW (ref 0.3–1.2)

## 2013-08-18 LAB — CBC WITH DIFFERENTIAL/PLATELET
BASOS PCT: 1 % (ref 0–1)
Basophils Absolute: 0 10*3/uL (ref 0.0–0.1)
Eosinophils Absolute: 0.2 10*3/uL (ref 0.0–0.7)
Eosinophils Relative: 4 % (ref 0–5)
HEMATOCRIT: 26.5 % — AB (ref 36.0–46.0)
Hemoglobin: 8.9 g/dL — ABNORMAL LOW (ref 12.0–15.0)
LYMPHS PCT: 53 % — AB (ref 12–46)
Lymphs Abs: 2.1 10*3/uL (ref 0.7–4.0)
MCH: 29.6 pg (ref 26.0–34.0)
MCHC: 33.6 g/dL (ref 30.0–36.0)
MCV: 88 fL (ref 78.0–100.0)
MONO ABS: 0.2 10*3/uL (ref 0.1–1.0)
Monocytes Relative: 6 % (ref 3–12)
NEUTROS ABS: 1.4 10*3/uL — AB (ref 1.7–7.7)
NEUTROS PCT: 36 % — AB (ref 43–77)
PLATELETS: 152 10*3/uL (ref 150–400)
RBC: 3.01 MIL/uL — ABNORMAL LOW (ref 3.87–5.11)
RDW: 14 % (ref 11.5–15.5)
WBC: 3.9 10*3/uL — AB (ref 4.0–10.5)

## 2013-08-18 LAB — LIPASE, BLOOD: LIPASE: 18 U/L (ref 11–59)

## 2013-08-18 MED ORDER — PROMETHAZINE HCL 25 MG PO TABS
25.0000 mg | ORAL_TABLET | Freq: Four times a day (QID) | ORAL | Status: DC | PRN
  Administered 2013-08-18 – 2013-08-19 (×2): 25 mg
  Filled 2013-08-18 (×2): qty 1

## 2013-08-18 NOTE — Discharge Summary (Addendum)
Physician Discharge Summary  [name] [id number] DOB: 1970/10/07 DOA: 08/13/2013  PCP: [name], MD  Admit date: 08/13/2013 Discharge date: 08/18/2013  Time spent: 40 minutes  Recommendations for Outpatient Follow-up:   Epigastric pain/Diabetic gastroparesis  -Normal lipase normal liver enzymes. Diabetic gastroparesis/narcotic dependence is cause.  -Patient has acutely stopped her pain regimen on 08/03/2013. Patient's gastroparesis symptoms have greatly improved from this hospitalization. - Acute abdominal series obtained; see results below  -DC Reglan IV, discharged on per tube Reglan -Consulted ([facility]) doctor schooler [phone]; recommendations; does not recommend endoscopy; patient to followup with Dr. Michail Sermon in 4 weeks -UDS; positive for marijuana (patient states sister gave her cookies with marijuana and she did not notice), positive for opiates.   -Occult blood card 1/15; negative  -Abdominal CT per GI; see results below  -1/19 doing well on bland diet, will discharge on this diet    Hypertension  - not within ADA guidelines  - Discharge on Labetalol to 200 mg BID  -Discharge on lisinopril 10 mg QD   Protein calorie malnutrition  - Continue home regimen and most likely secondary to principal problems listed above  - Discharge on Bland diet - Per Nutrition will discharge on Vital AF 1.2, and 37 mls per tube q. 3 hours while awake      Diabetes mellitus type 2  - Restart home Lantus regimen     GERD  - Discharge on per tube Protonix to 40 mg BID.   Pain control  -Again explained to patient why we shouldn't increase her narcotic medication. Counseled patient she would need to see her PCP or be referred to pain specialist for long-term pain control  -See gastroparesis     Discharge Diagnoses:  Active Problems:   GERD (gastroesophageal reflux disease)   Hypertension   DM (diabetes mellitus), type 2, uncontrolled   Protein-calorie malnutrition, severe    Epigastric pain   Nausea & vomiting   Diabetic gastroparesis   Discharge Condition: Stable  Diet recommendation: Diabetic  Filed Weights   08/13/13 1815 08/14/13 0540  Weight: 64.411 kg (142 lb) 65.454 kg (144 lb 4.8 oz)    History of present illness:  [name] is a 43 y.o. Female BF PMHx DRUG SEEKING BEHAVIOR (outside physician prescribes liquid methadone per PEG), Hx chronic pancreatitis, diabetes on insulin therapy, diabetic gastroparesis, S/P G-tube placement followed by Dr. Lacinda Axon and Adrian Blackwater, HTN, HLD, chronic leukocytosis. ,. She is presenting to the ED complaining of the above complaints which started since yesterday. The problems have been persistent since onset. Nothing she is aware of other than pain medicine and antiemetics here help make it better. Otherwise food makes it worse. Patient denies any fevers or blood per rectum. Patient also states that she has been having nausea and has vomited throughout the night.  While in the ED patient was found to have an elevated white blood cell count of 12.6, sodium level on 134, chloride level of 93, hyperglycemia of 286 on BMP, with normal lipase and liver enzymes. We were subsequently consult at for pain management as well as further evaluation and recommendations. While in the ED she was given Dilaudid for her abdominal discomfort. Because of recent CT scan of her abdomen which reportedly was unrevealing per my discussion with the ED physician he decided not to repeat CT scan 08/14/2013 review of patient's labs on admission show that lipase within normal limit, glucose elevated to 267, CBC mildly elevated without left shift or bands. States that  starting 2 days ago throwing up frank blood. Also states after discharge from hospital on 08/03/2013 she decided she needed to stop her pain medications so stopped methadone and other narcotics on her own. Currently rates abdominal pain at 8/10LUQ/LLQ> and her of abdomen. 08/15/2013 states pain  decreased, has had bowel movement, however still continues to have abdominal discomfort which she believes she needs increased narcotic medication for her. States had some nausea when she tried liquid diet. States has not been taken down for abdominal CT yet. 08/16/2013 patient feeling much improved. Able to tolerate tube feedings. States now is hungry. Having some abdominal pain but greatly reduced. 08/18/2013 patient ate bland diet for breakfast and lunch with just mild increase abdominal pain, negative N./V. ready for discharge    Consultants:  Doctor Michail Sermon [facility])    Procedures:  CT abdomen pelvis with contrast 08/15/2013  1. No acute process in the abdomen or pelvis.  2. Fluid within the colon, suggesting a diarrheal state.  3. Distal esophageal wall thickening is chronic and suspicious for  esophagitis.  4. Urinary bladder wall thickening, suspicious for a component of  bladder outlet obstruction. Cystitis felt less likely, given absence  of surrounding edema.  5. Gastrostomy tube looped in stomach with tip at proximal duodenum.  08/14/2013  dual lumen power injectable PICC line to right brachial vein.  Acute abdominal series 08/13/2013  No acute finding    Discharge Exam: Filed Vitals:   08/17/13 1928 08/17/13 2046 08/18/13 0500 08/18/13 1047  BP:  138/76 149/86   Pulse:  94 97   Temp:  98.7 F (37.1 C) 98.5 F (36.9 C)   TempSrc:  Oral Oral   Resp:  16 16   Height:      Weight:      SpO2: 100% 100% 100% 98%   General: A./O. x4, mild pain abdominal rate at 3/10 (8 bland breakfast without N./V) Cardiovascular: Regular rhythm and rate, negative murmurs rubs gallops, DP/PT pulse +2  Respiratory: Clear to auscultation bilateral  Abdomen: Mild Diffuse abdominal pain LUQ/LLQ> RLQ/LUQ significantly improved from admission   Musculoskeletal: Negative pedal edema, negative cyanosis   Discharge Instructions     Medication List    ASK your doctor about these  medications       acetaminophen 160 MG/5ML solution  Commonly known as:  TYLENOL  Place 20.3 mLs (650 mg total) into feeding tube 3 (three) times daily.     atorvastatin 40 MG tablet  Commonly known as:  LIPITOR  Place 1 tablet (40 mg total) into feeding tube daily at 6 PM.     busPIRone 5 MG tablet  Commonly known as:  BUSPAR  Place 5 mg into feeding tube 3 (three) times daily.     feeding supplement (VITAL AF 1.2 CAL) Liqd  Place 237 mLs into feeding tube every 3 (three) hours while awake.     Fluticasone-Salmeterol 250-50 MCG/DOSE Aepb  Commonly known as:  ADVAIR  Inhale 1 puff into the lungs 2 (two) times daily.     gabapentin 600 MG tablet  Commonly known as:  NEURONTIN  Place 600 mg into feeding tube 3 (three) times daily.     insulin aspart 100 UNIT/ML injection  Commonly known as:  novoLOG  Inject 0-9 Units into the skin every 4 (four) hours.     insulin glargine 100 UNIT/ML injection  Commonly known as:  LANTUS  Inject 15 Units into the skin at bedtime.     labetalol 100  MG tablet  Commonly known as:  NORMODYNE  Place 1 tablet (100 mg total) into feeding tube 2 (two) times daily.     lisinopril 10 MG tablet  Commonly known as:  PRINIVIL,ZESTRIL  Place 10 mg into feeding tube daily.     magnesium oxide 400 MG tablet  Commonly known as:  MAG-OX  Place 400 mg into feeding tube daily.     metoCLOPramide 5 MG/5ML solution  Commonly known as:  REGLAN  Take 10 mLs (10 mg total) by mouth 3 (three) times daily before meals.     mirtazapine 15 MG tablet  Commonly known as:  REMERON  Place 15 mg into feeding tube at bedtime.     multivitamin Liqd  Place 5 mLs into feeding tube daily.     pantoprazole sodium 40 mg/20 mL Pack  Commonly known as:  PROTONIX  Place 40 mg into feeding tube daily.     promethazine 25 MG suppository  Commonly known as:  PHENERGAN  Place 25 mg rectally every 6 (six) hours as needed for nausea.     sucralfate 1 GM/10ML suspension   Commonly known as:  CARAFATE  Place 10 mLs (1 g total) into feeding tube 4 (four) times daily -  with meals and at bedtime.     torsemide 20 MG tablet  Commonly known as:  DEMADEX  Place 20 mg into feeding tube daily.     zolpidem 10 MG tablet  Commonly known as:  AMBIEN  Place 10 mg into feeding tube at bedtime.       Allergies  Allergen Reactions  . Cefadroxil Other (See Comments)    Not sure of effects-listed as allergy on list from home  . Darvocet [Propoxyphene N-Acetaminophen] Other (See Comments)    UNKNOWN  . Compazine Hives    Tolerates promethazine  . Nsaids Hives  . Penicillins Hives  . Zofran [Ondansetron] Hives      The results of significant diagnostics from this hospitalization (including imaging, microbiology, ancillary and laboratory) are listed below for reference.    Significant Diagnostic Studies: Dg Abd 1 View  08/13/2013   CLINICAL DATA:  Abdominal pain, nausea and vomiting.  EXAM: ABDOMEN - 1 VIEW  COMPARISON:  Chest in two views abdomen 08/01/2013.  FINDINGS: The bowel gas pattern is unremarkable. No abnormal abdominal calcification is seen. Feeding tube is noted. Cholecystectomy clips and postoperative change of lower lumbar fusion also noted  IMPRESSION: No acute finding.   Electronically Signed   By: Inge Rise M.D.   On: 08/13/2013 17:55   Ct Abdomen Pelvis W Contrast  08/15/2013   CLINICAL DATA:  Abdominal pain for 2 weeks. Nausea and vomiting. Gastroesophageal reflux disease.  EXAM: CT ABDOMEN AND PELVIS WITH CONTRAST  TECHNIQUE: Multidetector CT imaging of the abdomen and pelvis was performed using the standard protocol following bolus administration of intravenous contrast.  CONTRAST:  168mL OMNIPAQUE IOHEXOL 300 MG/ML  SOLN  COMPARISON:  DG ABDOMEN 1V dated 08/13/2013; CT ABD/PELVIS W CM dated 07/30/2013; CT ABD/PELVIS W CM dated 05/27/2013; CT ABD/PELV WO CM dated 02/06/2013  FINDINGS: Lower Chest: Clear lung bases. Normal heart size without  pericardial or pleural effusion. Stents in the LAD and a diagonal branch. Incompletely imaged central line. Distal esophageal wall thickening.  Abdomen/Pelvis: Normal liver, spleen. A gastrostomy tube is identified. This is looped in the stomach, with the tip in the proximal duodenum. Stomach is partially decompressed.  Normal pancreas. Cholecystectomy, without biliary ductal dilatation. Normal adrenal glands  and kidneys  Advanced aortic atherosclerosis for age. No retroperitoneal or retrocrural adenopathy. Portions of the colon are fluid-filled, suggesting a diarrheal state. Normal terminal ileum. Surgical sutures likely identified within the jejunum. No bowel obstruction or ascites. No pneumatosis or free intraperitoneal air.  No pelvic adenopathy. Mildly thick-walled urinary bladder. Hysterectomy. No adnexal mass or significant free fluid.  Bones/Musculoskeletal: Osteopenia. Prior lumbar laminectomies. L4-5 fixation. Mild wedging of the T11 vertebral body which is chronic.  IMPRESSION: 1.  No acute process in the abdomen or pelvis. 2. Fluid within the colon, suggesting a diarrheal state. 3. Distal esophageal wall thickening is chronic and suspicious for esophagitis. 4. Urinary bladder wall thickening, suspicious for a component of bladder outlet obstruction. Cystitis felt less likely, given absence of surrounding edema. 5. Gastrostomy tube looped in stomach with tip at proximal duodenum.   Electronically Signed   By: Abigail Miyamoto M.D.   On: 08/15/2013 18:51   Ct Abdomen Pelvis W Contrast  07/30/2013   CLINICAL DATA:  Abdominal pain, nausea, vomiting, diarrhea, history of hysterectomy, PEG tube, cholecystectomy, partial colon resection, diverticulitis  EXAM: CT ABDOMEN AND PELVIS WITH CONTRAST  TECHNIQUE: Multidetector CT imaging of the abdomen and pelvis was performed using the standard protocol following bolus administration of intravenous contrast.  CONTRAST:  172mL OMNIPAQUE IOHEXOL 300 MG/ML  SOLN   COMPARISON:  05/27/2013  FINDINGS: Visualized portions of lung bases clear.  Study is effectively performed without contrast, as there was IV infiltration of the injected dose. The patient's referring physician and nurse were notified. There is also no oral contrast.  Allowing for the absence of contrast, there is grossly normal liver, spleen, and pancreas. There is a small hiatal hernia. The patient is status post cholecystectomy. Hip common bile duct is mildly prominent. This is likely due to prior cholecystectomy. There is a percutaneous gastrostomy tube with tip in the stomach. Adrenal glands are normal. Kidneys are normal.  There is a nonobstructive bowel gas pattern. There is left upper quadrant colon anastomosis. There is no free air or fluid in the abdomen or pelvis. There is no evidence of focal inflammatory process.  Pelvic organs appear to be absent. The bladder is distended. There are no acute musculoskeletal findings.  IMPRESSION: No acute abnormality. Study is effectively performed without contrast given intravenous infiltration of the contrast does as described above.   Electronically Signed   By: Skipper Cliche M.D.   On: 07/30/2013 14:11   Ir Fluoro Guide Cv Line Right  08/14/2013   CLINICAL DATA:  Poor venous access, request for image guided PICC placement.  EXAM: DUAL LUMEN POWER INJECTABLE PICC LINE PLACEMENT WITH ULTRASOUND AND FLUOROSCOPIC GUIDANCE  FLUOROSCOPY TIME:  0.3 minutes  PROCEDURE: The patient was advised of the possible risks and complications and agreed to undergo the procedure. The patient was then brought to the angiographic suite for the procedure.  The right arm was prepped with chlorhexidine, draped in the usual sterile fashion using maximum barrier technique (cap and mask, sterile gown, sterile gloves, large sterile sheet, hand hygiene and cutaneous antisepsis) and infiltrated locally with 1% Lidocaine.  Ultrasound demonstrated patency of the right brachial vein, and this  was documented with an image. Under real-time ultrasound guidance, this vein was accessed with a 21 gauge micropuncture needle and image documentation was performed. A 0.018 wire was introduced in to the vein. Over this, a 5 [place] dual lumen power-injectable PICC was advanced to the lower SVC/right atrial junction. Fluoroscopy during the procedure and fluoro  spot radiograph confirms appropriate catheter position. The catheter was flushed and covered with a sterile dressing.  Complications: none.  IMPRESSION: Successful right arm Power injectable PICC line placement with ultrasound and fluoroscopic guidance. The catheter is ready for use.  Read By:  Tsosie Billing PA-C   Electronically Signed   By: Markus Daft M.D.   On: 08/14/2013 16:07   Ir Fluoro Guide Cv Line Right  07/31/2013   CLINICAL DATA:  43 year old needs IV access.  EXAM: POWER INJECTABLE PICC LINE PLACEMENT WITH ULTRASOUND AND FLUOROSCOPIC GUIDANCE  FLUOROSCOPY TIME:  6 seconds  PROCEDURE: The patient was advised of the possible risks and complications and agreed to undergo the procedure. The patient was then brought to the angiographic suite for the procedure.  The right arm was prepped with chlorhexidine, draped in the usual sterile fashion using maximum barrier technique (cap and mask, sterile gown, sterile gloves, large sterile sheet, hand hygiene and cutaneous antisepsis) and infiltrated locally with 1% Lidocaine.  Ultrasound demonstrated patency of the right brachial vein, and this was documented with an image. Under real-time ultrasound guidance, this vein was accessed with a 21 gauge micropuncture needle and image documentation was performed. A 0.018 wire was introduced into the vein. A peel-away sheath was placed. A 5 French dual lumen power injectable PICC was advanced to the lower SVC. Fluoroscopy during the procedure and fluoro spot radiograph confirms appropriate catheter position. Catheter was sutured to the skin. The catheter was  flushed and covered with a sterile dressing.  Complications: None  IMPRESSION: Successful right arm power injectable PICC line placement with ultrasound and fluoroscopic guidance. The catheter is ready for use.   Electronically Signed   By: Markus Daft M.D.   On: 07/31/2013 16:32   Ir US Guide Vasc Access Right  08/14/2013   CLINICAL DATA:  Poor venous access, request for image guided PICC placement.  EXAM: DUAL LUMEN POWER INJECTABLE PICC LINE PLACEMENT WITH ULTRASOUND AND FLUOROSCOPIC GUIDANCE  FLUOROSCOPY TIME:  0.3 minutes  PROCEDURE: The patient was advised of the possible risks and complications and agreed to undergo the procedure. The patient was then brought to the angiographic suite for the procedure.  The right arm was prepped with chlorhexidine, draped in the usual sterile fashion using maximum barrier technique (cap and mask, sterile gown, sterile gloves, large sterile sheet, hand hygiene and cutaneous antisepsis) and infiltrated locally with 1% Lidocaine.  Ultrasound demonstrated patency of the right brachial vein, and this was documented with an image. Under real-time ultrasound guidance, this vein was accessed with a 21 gauge micropuncture needle and image documentation was performed. A 0.018 wire was introduced in to the vein. Over this, a 5 [place] dual lumen power-injectable PICC was advanced to the lower SVC/right atrial junction. Fluoroscopy during the procedure and fluoro spot radiograph confirms appropriate catheter position. The catheter was flushed and covered with a sterile dressing.  Complications: none.  IMPRESSION: Successful right arm Power injectable PICC line placement with ultrasound and fluoroscopic guidance. The catheter is ready for use.  Read By:  Tsosie Billing PA-C   Electronically Signed   By: Markus Daft M.D.   On: 08/14/2013 16:07   Ir US Guide Vasc Access Right  07/31/2013   CLINICAL DATA:  43 year old needs IV access.  EXAM: POWER INJECTABLE PICC LINE PLACEMENT WITH  ULTRASOUND AND FLUOROSCOPIC GUIDANCE  FLUOROSCOPY TIME:  6 seconds  PROCEDURE: The patient was advised of the possible risks and complications and agreed to undergo the procedure. The  patient was then brought to the angiographic suite for the procedure.  The right arm was prepped with chlorhexidine, draped in the usual sterile fashion using maximum barrier technique (cap and mask, sterile gown, sterile gloves, large sterile sheet, hand hygiene and cutaneous antisepsis) and infiltrated locally with 1% Lidocaine.  Ultrasound demonstrated patency of the right brachial vein, and this was documented with an image. Under real-time ultrasound guidance, this vein was accessed with a 21 gauge micropuncture needle and image documentation was performed. A 0.018 wire was introduced into the vein. A peel-away sheath was placed. A 5 French dual lumen power injectable PICC was advanced to the lower SVC. Fluoroscopy during the procedure and fluoro spot radiograph confirms appropriate catheter position. Catheter was sutured to the skin. The catheter was flushed and covered with a sterile dressing.  Complications: None  IMPRESSION: Successful right arm power injectable PICC line placement with ultrasound and fluoroscopic guidance. The catheter is ready for use.   Electronically Signed   By: Markus Daft M.D.   On: 07/31/2013 16:32   Dg Chest Port 1 View  07/30/2013   CLINICAL DATA:  Vomiting, evaluate for aspiration  EXAM: PORTABLE CHEST - 1 VIEW  COMPARISON:  05/27/2013  FINDINGS: The heart size and mediastinal contours are within normal limits. Both lungs are clear. The visualized skeletal structures are unremarkable.  IMPRESSION: No active disease.   Electronically Signed   By: Skipper Cliche M.D.   On: 07/30/2013 18:19   Dg Abd Acute W/chest  08/01/2013   CLINICAL DATA:  Abdominal pain.  Nausea and vomiting.  EXAM: ACUTE ABDOMEN SERIES (ABDOMEN 2 VIEW & CHEST 1 VIEW)  COMPARISON:  Multiple exams, including 07/30/2013   FINDINGS: Right PICC line tip: SVC. Cardiac and mediastinal margins appear normal. The lungs appear clear.  No free intraperitoneal gas observed beneath the hemidiaphragms. There are some air-fluid levels in the proximal descending colon is and in nondilated left-sided loops of small bowel. The right abdomen is gasless. Peg tube noted.  Prior low lower lumbar spine scratch have postoperative findings in the lower lumbar spine.  IMPRESSION: Abnormal but nonspecific bowel gas pattern with air-fluid levels in the descending colon and in nondilated loops of left-sided small bowel. Appearance could reflect ileus, diarrheal process, or less likely orally obstruction. Right abdomen is gasless.   Electronically Signed   By: Sherryl Barters M.D.   On: 08/01/2013 16:27    Microbiology: Recent Results (from the past 240 hour(s))  URINE CULTURE     Status: None   Collection Time    08/13/13  1:51 PM      Result Value Range Status   Specimen Description URINE, CLEAN CATCH   Final   Special Requests NONE   Final   Culture  Setup Time     Final   Value: 08/13/2013 23:17     Performed at [facility]     Final   Value: NO GROWTH     Performed at [facility]   Culture     Final   Value: NO GROWTH     Performed at [facility]   Report Status 08/14/2013 FINAL   Final     Labs: Basic Metabolic Panel:  Recent Labs Lab 08/13/13 0950 08/13/13 1909 08/15/13 0540 08/16/13 0530 08/17/13 0455 08/18/13 0530  NA 134*  --  134* 141 139 139  K 4.0  --  3.4* 3.4* 3.4* 3.2*  CL 93*  --  100 106 106 104  CO2 25  --  22 22 21 23   GLUCOSE 267*  --  223* 141* 250* 200*  BUN 18  --  16 6 5* 7  CREATININE 0.62  --  0.84 0.58 0.57 0.59  CALCIUM 9.5  --  8.0* 8.3* 8.0* 7.9*  MG  --  1.5  --   --   --   --   PHOS  --  2.8  --   --   --   --    Liver Function Tests:  Recent Labs Lab 08/13/13 0950 08/15/13 0540 08/16/13 0530 08/17/13 0455 08/18/13 0530  AST 21 17  16 19 16   ALT 16 15 16 15 17   ALKPHOS 120* 74 83 90 117  BILITOT 0.2* <0.2* <0.2* <0.2* <0.2*  PROT 8.6* 5.4* 6.0 5.3* 5.6*  ALBUMIN 3.6 2.3* 2.7* 2.3* 2.5*    Recent Labs Lab 08/13/13 0950 08/15/13 0540 08/16/13 0530 08/17/13 0455 08/18/13 0530  LIPASE 16 13 12 11 18    No results found for this basename: AMMONIA,  in the last 168 hours CBC:  Recent Labs Lab 08/13/13 0950 08/15/13 0540 08/16/13 0530 08/17/13 0455 08/18/13 0530  WBC 12.6* 4.1 3.9* 3.5* 3.9*  NEUTROABS 9.1* 1.6* 1.4* 1.2* 1.4*  HGB 14.0 9.5* 9.8* 8.6* 8.9*  HCT 39.5 28.0* 28.6* 26.0* 26.5*  MCV 85.3 87.5 87.5 89.0 88.0  PLT 353 175 174 172 152   Cardiac Enzymes: No results found for this basename: CKTOTAL, CKMB, CKMBINDEX, TROPONINI,  in the last 168 hours BNP: BNP (last 3 results) No results found for this basename: PROBNP,  in the last 8760 hours CBG:  Recent Labs Lab 08/17/13 1934 08/18/13 0002 08/18/13 0359 08/18/13 0724 08/18/13 1058  GLUCAP 242* 188* 194* 199* 170*       Signed:  Dia Crawford, MD [facility] [phone] pager

## 2013-08-19 LAB — GLUCOSE, CAPILLARY
GLUCOSE-CAPILLARY: 176 mg/dL — AB (ref 70–99)
Glucose-Capillary: 284 mg/dL — ABNORMAL HIGH (ref 70–99)

## 2013-08-19 LAB — CBC WITH DIFFERENTIAL/PLATELET
Basophils Absolute: 0 10*3/uL (ref 0.0–0.1)
Basophils Relative: 0 % (ref 0–1)
EOS ABS: 0.2 10*3/uL (ref 0.0–0.7)
Eosinophils Relative: 3 % (ref 0–5)
HCT: 27.1 % — ABNORMAL LOW (ref 36.0–46.0)
HEMOGLOBIN: 9.3 g/dL — AB (ref 12.0–15.0)
Lymphocytes Relative: 33 % (ref 12–46)
Lymphs Abs: 1.8 10*3/uL (ref 0.7–4.0)
MCH: 30.2 pg (ref 26.0–34.0)
MCHC: 34.3 g/dL (ref 30.0–36.0)
MCV: 88 fL (ref 78.0–100.0)
MONO ABS: 0.3 10*3/uL (ref 0.1–1.0)
MONOS PCT: 6 % (ref 3–12)
NEUTROS ABS: 3.1 10*3/uL (ref 1.7–7.7)
Neutrophils Relative %: 58 % (ref 43–77)
Platelets: 168 10*3/uL (ref 150–400)
RBC: 3.08 MIL/uL — ABNORMAL LOW (ref 3.87–5.11)
RDW: 14.4 % (ref 11.5–15.5)
WBC: 5.3 10*3/uL (ref 4.0–10.5)

## 2013-08-19 LAB — COMPREHENSIVE METABOLIC PANEL
ALT: 14 U/L (ref 0–35)
AST: 16 U/L (ref 0–37)
Albumin: 2.5 g/dL — ABNORMAL LOW (ref 3.5–5.2)
Alkaline Phosphatase: 122 U/L — ABNORMAL HIGH (ref 39–117)
BUN: 10 mg/dL (ref 6–23)
CALCIUM: 8.3 mg/dL — AB (ref 8.4–10.5)
CO2: 26 mEq/L (ref 19–32)
Chloride: 104 mEq/L (ref 96–112)
Creatinine, Ser: 0.55 mg/dL (ref 0.50–1.10)
GFR calc Af Amer: >90 mL/min (ref >90)
GFR calc non Af Amer: >90 mL/min (ref >90)
Glucose, Bld: 224 mg/dL — ABNORMAL HIGH (ref 70–99)
Potassium: 3.3 mEq/L — ABNORMAL LOW (ref 3.7–5.3)
SODIUM: 142 meq/L (ref 137–147)
TOTAL PROTEIN: 5.8 g/dL — AB (ref 6.0–8.3)
Total Bilirubin: <0.2 mg/dL — ABNORMAL LOW (ref 0.3–1.2)

## 2013-08-19 LAB — LIPASE, BLOOD: Lipase: 19 U/L (ref 11–59)

## 2013-08-19 MED ORDER — OXYCODONE HCL 5 MG PO TABS: 5.0000 mg | ORAL_TABLET | ORAL | Status: DC | PRN

## 2013-08-19 MED ORDER — PANTOPRAZOLE SODIUM 40 MG PO PACK: 40.0000 mg | PACK | Freq: Two times a day (BID) | ORAL | Status: DC

## 2013-08-19 MED ORDER — LABETALOL HCL 200 MG PO TABS: 200.0000 mg | ORAL_TABLET | Freq: Two times a day (BID) | ORAL | Status: DC

## 2013-08-19 MED ORDER — MAGNESIUM OXIDE 400 (241.3 MG) MG PO TABS: 400.0000 mg | ORAL_TABLET | Freq: Every day | ORAL | Status: DC

## 2013-08-19 MED ORDER — GABAPENTIN 250 MG/5ML PO SOLN: 600.0000 mg | Freq: Three times a day (TID) | ORAL | Status: DC

## 2013-08-19 MED ORDER — PROMETHAZINE HCL 25 MG PO TABS
25.0000 mg | ORAL_TABLET | Freq: Four times a day (QID) | ORAL | Status: DC | PRN
Start: 2013-08-19 — End: 2013-10-26

## 2013-08-19 NOTE — Progress Notes (Addendum)
Patient discharged home, all discharge medications and instructions reviewed. Patient provided with a prescription for pain medication.  Patient refused wheelchair assistance to vehicle.

## 2013-09-13 ENCOUNTER — Encounter (HOSPITAL_COMMUNITY): Payer: Self-pay | Admitting: Emergency Medicine

## 2013-09-13 ENCOUNTER — Encounter (HOSPITAL_COMMUNITY)
Admission: EM | Disposition: A | Discharge status: Home Health | Payer: Self-pay | Source: Home / Self Care | Attending: Internal Medicine

## 2013-09-13 ENCOUNTER — Inpatient Hospital Stay (HOSPITAL_COMMUNITY): Payer: Medicare Other

## 2013-09-13 ENCOUNTER — Inpatient Hospital Stay (HOSPITAL_COMMUNITY)
Admission: EM | Admit: 2013-09-13 | Discharge: 2013-09-25 | DRG: 391 | Disposition: A | Discharge status: Home Health | Payer: Medicare Other | Attending: Internal Medicine | Admitting: Internal Medicine

## 2013-09-13 ENCOUNTER — Emergency Department (HOSPITAL_COMMUNITY): Payer: Medicare Other

## 2013-09-13 DIAGNOSIS — Z881 Allergy status to other antibiotic agents status: Secondary | ICD-10-CM

## 2013-09-13 DIAGNOSIS — K861 Other chronic pancreatitis: Secondary | ICD-10-CM | POA: Diagnosis present

## 2013-09-13 DIAGNOSIS — L02219 Cutaneous abscess of trunk, unspecified: Secondary | ICD-10-CM | POA: Diagnosis present

## 2013-09-13 DIAGNOSIS — Z765 Malingerer [conscious simulation]: Secondary | ICD-10-CM

## 2013-09-13 DIAGNOSIS — IMO0002 Reserved for concepts with insufficient information to code with codable children: Secondary | ICD-10-CM

## 2013-09-13 DIAGNOSIS — Z833 Family history of diabetes mellitus: Secondary | ICD-10-CM

## 2013-09-13 DIAGNOSIS — E871 Hypo-osmolality and hyponatremia: Secondary | ICD-10-CM | POA: Diagnosis present

## 2013-09-13 DIAGNOSIS — E1049 Type 1 diabetes mellitus with other diabetic neurological complication: Secondary | ICD-10-CM | POA: Diagnosis present

## 2013-09-13 DIAGNOSIS — I2510 Atherosclerotic heart disease of native coronary artery without angina pectoris: Secondary | ICD-10-CM | POA: Diagnosis present

## 2013-09-13 DIAGNOSIS — Z794 Long term (current) use of insulin: Secondary | ICD-10-CM

## 2013-09-13 DIAGNOSIS — K228 Other specified diseases of esophagus: Secondary | ICD-10-CM | POA: Diagnosis present

## 2013-09-13 DIAGNOSIS — E8729 Other acidosis: Secondary | ICD-10-CM

## 2013-09-13 DIAGNOSIS — Z6826 Body mass index (BMI) 26.0-26.9, adult: Secondary | ICD-10-CM

## 2013-09-13 DIAGNOSIS — K3184 Gastroparesis: Principal | ICD-10-CM

## 2013-09-13 DIAGNOSIS — R569 Unspecified convulsions: Secondary | ICD-10-CM | POA: Diagnosis present

## 2013-09-13 DIAGNOSIS — K920 Hematemesis: Secondary | ICD-10-CM

## 2013-09-13 DIAGNOSIS — Z79899 Other long term (current) drug therapy: Secondary | ICD-10-CM

## 2013-09-13 DIAGNOSIS — J4489 Other specified chronic obstructive pulmonary disease: Secondary | ICD-10-CM | POA: Diagnosis present

## 2013-09-13 DIAGNOSIS — Z791 Long term (current) use of non-steroidal anti-inflammatories (NSAID): Secondary | ICD-10-CM

## 2013-09-13 DIAGNOSIS — E1142 Type 2 diabetes mellitus with diabetic polyneuropathy: Secondary | ICD-10-CM | POA: Diagnosis present

## 2013-09-13 DIAGNOSIS — I252 Old myocardial infarction: Secondary | ICD-10-CM

## 2013-09-13 DIAGNOSIS — K9423 Gastrostomy malfunction: Secondary | ICD-10-CM | POA: Diagnosis present

## 2013-09-13 DIAGNOSIS — A0472 Enterocolitis due to Clostridium difficile, not specified as recurrent: Secondary | ICD-10-CM

## 2013-09-13 DIAGNOSIS — Z885 Allergy status to narcotic agent status: Secondary | ICD-10-CM

## 2013-09-13 DIAGNOSIS — E876 Hypokalemia: Secondary | ICD-10-CM | POA: Diagnosis not present

## 2013-09-13 DIAGNOSIS — E872 Acidosis: Secondary | ICD-10-CM

## 2013-09-13 DIAGNOSIS — K219 Gastro-esophageal reflux disease without esophagitis: Secondary | ICD-10-CM

## 2013-09-13 DIAGNOSIS — E860 Dehydration: Secondary | ICD-10-CM

## 2013-09-13 DIAGNOSIS — E1143 Type 2 diabetes mellitus with diabetic autonomic (poly)neuropathy: Secondary | ICD-10-CM

## 2013-09-13 DIAGNOSIS — R1013 Epigastric pain: Secondary | ICD-10-CM

## 2013-09-13 DIAGNOSIS — E878 Other disorders of electrolyte and fluid balance, not elsewhere classified: Secondary | ICD-10-CM | POA: Diagnosis present

## 2013-09-13 DIAGNOSIS — Z8249 Family history of ischemic heart disease and other diseases of the circulatory system: Secondary | ICD-10-CM

## 2013-09-13 DIAGNOSIS — E1140 Type 2 diabetes mellitus with diabetic neuropathy, unspecified: Secondary | ICD-10-CM

## 2013-09-13 DIAGNOSIS — L03319 Cellulitis of trunk, unspecified: Secondary | ICD-10-CM

## 2013-09-13 DIAGNOSIS — Z9089 Acquired absence of other organs: Secondary | ICD-10-CM

## 2013-09-13 DIAGNOSIS — IMO0001 Reserved for inherently not codable concepts without codable children: Secondary | ICD-10-CM

## 2013-09-13 DIAGNOSIS — R112 Nausea with vomiting, unspecified: Secondary | ICD-10-CM

## 2013-09-13 DIAGNOSIS — Z87891 Personal history of nicotine dependence: Secondary | ICD-10-CM

## 2013-09-13 DIAGNOSIS — Z431 Encounter for attention to gastrostomy: Secondary | ICD-10-CM

## 2013-09-13 DIAGNOSIS — J449 Chronic obstructive pulmonary disease, unspecified: Secondary | ICD-10-CM | POA: Diagnosis present

## 2013-09-13 DIAGNOSIS — R042 Hemoptysis: Secondary | ICD-10-CM

## 2013-09-13 DIAGNOSIS — N179 Acute kidney failure, unspecified: Secondary | ICD-10-CM

## 2013-09-13 DIAGNOSIS — E1065 Type 1 diabetes mellitus with hyperglycemia: Secondary | ICD-10-CM | POA: Diagnosis present

## 2013-09-13 DIAGNOSIS — K208 Other esophagitis without bleeding: Secondary | ICD-10-CM | POA: Diagnosis present

## 2013-09-13 DIAGNOSIS — D72829 Elevated white blood cell count, unspecified: Secondary | ICD-10-CM | POA: Diagnosis present

## 2013-09-13 DIAGNOSIS — Z888 Allergy status to other drugs, medicaments and biological substances status: Secondary | ICD-10-CM

## 2013-09-13 DIAGNOSIS — R109 Unspecified abdominal pain: Secondary | ICD-10-CM

## 2013-09-13 DIAGNOSIS — E1165 Type 2 diabetes mellitus with hyperglycemia: Secondary | ICD-10-CM | POA: Diagnosis present

## 2013-09-13 DIAGNOSIS — E1149 Type 2 diabetes mellitus with other diabetic neurological complication: Secondary | ICD-10-CM

## 2013-09-13 DIAGNOSIS — K449 Diaphragmatic hernia without obstruction or gangrene: Secondary | ICD-10-CM | POA: Diagnosis present

## 2013-09-13 DIAGNOSIS — R739 Hyperglycemia, unspecified: Secondary | ICD-10-CM | POA: Diagnosis present

## 2013-09-13 DIAGNOSIS — I10 Essential (primary) hypertension: Secondary | ICD-10-CM | POA: Diagnosis present

## 2013-09-13 DIAGNOSIS — K2289 Other specified disease of esophagus: Secondary | ICD-10-CM | POA: Diagnosis present

## 2013-09-13 DIAGNOSIS — G8929 Other chronic pain: Secondary | ICD-10-CM | POA: Diagnosis present

## 2013-09-13 DIAGNOSIS — H547 Unspecified visual loss: Secondary | ICD-10-CM | POA: Diagnosis present

## 2013-09-13 DIAGNOSIS — E785 Hyperlipidemia, unspecified: Secondary | ICD-10-CM

## 2013-09-13 DIAGNOSIS — Z880 Allergy status to penicillin: Secondary | ICD-10-CM

## 2013-09-13 DIAGNOSIS — E46 Unspecified protein-calorie malnutrition: Secondary | ICD-10-CM | POA: Diagnosis present

## 2013-09-13 HISTORY — DX: Diverticulosis of intestine, part unspecified, without perforation or abscess without bleeding: K57.90

## 2013-09-13 HISTORY — DX: Gastroparesis: K31.84

## 2013-09-13 HISTORY — DX: Type 2 diabetes mellitus with diabetic neuropathy, unspecified: E11.40

## 2013-09-13 HISTORY — DX: Type 2 diabetes mellitus with diabetic autonomic (poly)neuropathy: E11.43

## 2013-09-13 HISTORY — PX: ESOPHAGOGASTRODUODENOSCOPY: SHX5428

## 2013-09-13 HISTORY — DX: Type 1 diabetes mellitus with other diabetic neurological complication: E10.49

## 2013-09-13 LAB — BLOOD GAS, ARTERIAL
ACID-BASE EXCESS: 5.9 mmol/L — AB (ref 0.0–2.0)
Bicarbonate: 28.9 mEq/L — ABNORMAL HIGH (ref 20.0–24.0)
Drawn by: 257701
FIO2: 0.21 %
O2 SAT: 96 %
PCO2 ART: 36.9 mmHg (ref 35.0–45.0)
PO2 ART: 77.3 mmHg — AB (ref 80.0–100.0)
Patient temperature: 98.6
TCO2: 25.6 mmol/L (ref 0–100)
pH, Arterial: 7.505 — ABNORMAL HIGH (ref 7.350–7.450)

## 2013-09-13 LAB — CBC WITH DIFFERENTIAL/PLATELET
BASOS ABS: 0 10*3/uL (ref 0.0–0.1)
BASOS PCT: 0 % (ref 0–1)
Eosinophils Absolute: 0 10*3/uL (ref 0.0–0.7)
Eosinophils Relative: 0 % (ref 0–5)
HCT: 36.5 % (ref 36.0–46.0)
Hemoglobin: 12.5 g/dL (ref 12.0–15.0)
LYMPHS PCT: 14 % (ref 12–46)
Lymphs Abs: 2.4 10*3/uL (ref 0.7–4.0)
MCH: 29.3 pg (ref 26.0–34.0)
MCHC: 34.2 g/dL (ref 30.0–36.0)
MCV: 85.5 fL (ref 78.0–100.0)
MONOS PCT: 2 % — AB (ref 3–12)
Monocytes Absolute: 0.3 10*3/uL (ref 0.1–1.0)
NEUTROS PCT: 83 % — AB (ref 43–77)
Neutro Abs: 13.8 10*3/uL — ABNORMAL HIGH (ref 1.7–7.7)
Platelets: 352 10*3/uL (ref 150–400)
RBC: 4.27 MIL/uL (ref 3.87–5.11)
RDW: 15.2 % (ref 11.5–15.5)
WBC: 16.5 10*3/uL — AB (ref 4.0–10.5)

## 2013-09-13 LAB — URINE MICROSCOPIC-ADD ON

## 2013-09-13 LAB — COMPREHENSIVE METABOLIC PANEL
ALT: 12 U/L (ref 0–35)
AST: 18 U/L (ref 0–37)
Albumin: 3.1 g/dL — ABNORMAL LOW (ref 3.5–5.2)
Alkaline Phosphatase: 136 U/L — ABNORMAL HIGH (ref 39–117)
BUN: 14 mg/dL (ref 6–23)
CALCIUM: 9.7 mg/dL (ref 8.4–10.5)
CO2: 24 mEq/L (ref 19–32)
Chloride: 84 mEq/L — ABNORMAL LOW (ref 96–112)
Creatinine, Ser: 0.54 mg/dL (ref 0.50–1.10)
GLUCOSE: 489 mg/dL — AB (ref 70–99)
Potassium: 3.7 mEq/L (ref 3.7–5.3)
Sodium: 129 mEq/L — ABNORMAL LOW (ref 137–147)
Total Bilirubin: <0.2 mg/dL — ABNORMAL LOW (ref 0.3–1.2)
Total Protein: 8.8 g/dL — ABNORMAL HIGH (ref 6.0–8.3)

## 2013-09-13 LAB — URINALYSIS, ROUTINE W REFLEX MICROSCOPIC
Bilirubin Urine: NEGATIVE
Glucose, UA: >1000 mg/dL — AB
Ketones, ur: NEGATIVE mg/dL
Leukocytes, UA: NEGATIVE
NITRITE: NEGATIVE
PH: 7 (ref 5.0–8.0)
Protein, ur: >300 mg/dL — AB
SPECIFIC GRAVITY, URINE: 1.035 — AB (ref 1.005–1.030)
Urobilinogen, UA: 0.2 mg/dL (ref 0.0–1.0)

## 2013-09-13 LAB — RAPID URINE DRUG SCREEN, HOSP PERFORMED
Amphetamines: NOT DETECTED
BENZODIAZEPINES: POSITIVE — AB
Barbiturates: NOT DETECTED
COCAINE: NOT DETECTED
Opiates: POSITIVE — AB
Tetrahydrocannabinol: POSITIVE — AB

## 2013-09-13 LAB — GLUCOSE, CAPILLARY
GLUCOSE-CAPILLARY: 280 mg/dL — AB (ref 70–99)
GLUCOSE-CAPILLARY: 258 mg/dL — AB (ref 70–99)
Glucose-Capillary: 398 mg/dL — ABNORMAL HIGH (ref 70–99)
Glucose-Capillary: 166 mg/dL — ABNORMAL HIGH (ref 70–99)

## 2013-09-13 LAB — KETONES, QUALITATIVE: Acetone, Bld: NEGATIVE

## 2013-09-13 LAB — LACTIC ACID, PLASMA: LACTIC ACID, VENOUS: 1.7 mmol/L (ref 0.5–2.2)

## 2013-09-13 LAB — LIPASE, BLOOD: Lipase: 31 U/L (ref 11–59)

## 2013-09-13 SURGERY — EGD (ESOPHAGOGASTRODUODENOSCOPY)
Anesthesia: Moderate Sedation

## 2013-09-13 MED ORDER — FENTANYL CITRATE 0.05 MG/ML IJ SOLN
INTRAMUSCULAR | Status: AC
  Filled 2013-09-13: qty 4

## 2013-09-13 MED ORDER — METOCLOPRAMIDE HCL 5 MG/ML IJ SOLN
10.0000 mg | Freq: Three times a day (TID) | INTRAMUSCULAR | Status: DC
  Administered 2013-09-13 – 2013-09-14 (×5): 10 mg via INTRAVENOUS
  Filled 2013-09-13 (×9): qty 2

## 2013-09-13 MED ORDER — METOCLOPRAMIDE HCL 5 MG/ML IJ SOLN
10.0000 mg | Freq: Three times a day (TID) | INTRAMUSCULAR | Status: DC | PRN
  Administered 2013-09-15: 10 mg via INTRAVENOUS

## 2013-09-13 MED ORDER — INSULIN ASPART 100 UNIT/ML ~~LOC~~ SOLN
9.0000 [IU] | Freq: Once | SUBCUTANEOUS | Status: DC
  Filled 2013-09-13: qty 1

## 2013-09-13 MED ORDER — SODIUM CHLORIDE 0.9 % IV SOLN
25.0000 mg | Freq: Four times a day (QID) | INTRAVENOUS | Status: DC | PRN
  Administered 2013-09-13 – 2013-09-21 (×25): 25 mg via INTRAVENOUS
  Filled 2013-09-13 (×28): qty 1

## 2013-09-13 MED ORDER — SODIUM CHLORIDE 0.9 % IV BOLUS (SEPSIS)
500.0000 mL | Freq: Once | INTRAVENOUS | Status: AC
  Administered 2013-09-13: 500 mL via INTRAVENOUS

## 2013-09-13 MED ORDER — HYDROMORPHONE HCL PF 2 MG/ML IJ SOLN
2.0000 mg | Freq: Once | INTRAMUSCULAR | Status: AC
  Administered 2013-09-13: 2 mg via INTRAMUSCULAR
  Filled 2013-09-13: qty 1

## 2013-09-13 MED ORDER — METOCLOPRAMIDE HCL 5 MG/ML IJ SOLN: 10.0000 mg | Freq: Once | INTRAMUSCULAR | Status: DC

## 2013-09-13 MED ORDER — SODIUM CHLORIDE 0.9 % IJ SOLN
3.0000 mL | Freq: Two times a day (BID) | INTRAMUSCULAR | Status: DC
  Administered 2013-09-15 – 2013-09-23 (×4): 3 mL via INTRAVENOUS

## 2013-09-13 MED ORDER — INSULIN ASPART 100 UNIT/ML ~~LOC~~ SOLN
0.0000 [IU] | Freq: Three times a day (TID) | SUBCUTANEOUS | Status: DC
  Administered 2013-09-13: 5 [IU] via SUBCUTANEOUS
  Administered 2013-09-14: 2 [IU] via SUBCUTANEOUS

## 2013-09-13 MED ORDER — SODIUM CHLORIDE 0.9 % IV SOLN
INTRAVENOUS | Status: DC
  Administered 2013-09-13: 125 mL/h via INTRAVENOUS
  Administered 2013-09-13: 22:00:00 via INTRAVENOUS
  Administered 2013-09-14: 1000 mL via INTRAVENOUS
  Administered 2013-09-15 (×3): via INTRAVENOUS

## 2013-09-13 MED ORDER — HYDROMORPHONE HCL PF 1 MG/ML IJ SOLN
1.0000 mg | Freq: Once | INTRAMUSCULAR | Status: AC
  Administered 2013-09-13: 1 mg via INTRAVENOUS
  Filled 2013-09-13: qty 1

## 2013-09-13 MED ORDER — MIDAZOLAM HCL 10 MG/2ML IJ SOLN
INTRAMUSCULAR | Status: AC
  Filled 2013-09-13: qty 4

## 2013-09-13 MED ORDER — INSULIN ASPART 100 UNIT/ML ~~LOC~~ SOLN
0.0000 [IU] | SUBCUTANEOUS | Status: DC
  Administered 2013-09-13: 9 [IU] via SUBCUTANEOUS

## 2013-09-13 MED ORDER — PANTOPRAZOLE SODIUM 40 MG IV SOLR
40.0000 mg | Freq: Two times a day (BID) | INTRAVENOUS | Status: DC
  Administered 2013-09-13 – 2013-09-16 (×7): 40 mg via INTRAVENOUS
  Filled 2013-09-13 (×9): qty 40

## 2013-09-13 MED ORDER — METOCLOPRAMIDE HCL 5 MG/ML IJ SOLN
10.0000 mg | Freq: Once | INTRAMUSCULAR | Status: AC
  Administered 2013-09-13: 10 mg via INTRAVENOUS
  Filled 2013-09-13: qty 2

## 2013-09-13 MED ORDER — MOMETASONE FURO-FORMOTEROL FUM 100-5 MCG/ACT IN AERO
2.0000 | INHALATION_SPRAY | Freq: Two times a day (BID) | RESPIRATORY_TRACT | Status: DC
  Administered 2013-09-14 – 2013-09-24 (×18): 2 via RESPIRATORY_TRACT
  Filled 2013-09-13: qty 8.8

## 2013-09-13 MED ORDER — INSULIN GLARGINE 100 UNIT/ML ~~LOC~~ SOLN
10.0000 [IU] | Freq: Every day | SUBCUTANEOUS | Status: DC
  Administered 2013-09-14 – 2013-09-24 (×12): 10 [IU] via SUBCUTANEOUS
  Filled 2013-09-13 (×13): qty 0.1

## 2013-09-13 MED ORDER — HYDROMORPHONE HCL PF 1 MG/ML IJ SOLN
1.0000 mg | INTRAMUSCULAR | Status: AC | PRN
  Administered 2013-09-13 (×2): 1 mg via INTRAVENOUS
  Filled 2013-09-13 (×3): qty 1

## 2013-09-13 MED ORDER — METOPROLOL TARTRATE 1 MG/ML IV SOLN: 10.0000 mg | Freq: Once | INTRAVENOUS | Status: DC

## 2013-09-13 MED ORDER — DIPHENHYDRAMINE HCL 50 MG/ML IJ SOLN
INTRAMUSCULAR | Status: AC
  Filled 2013-09-13: qty 1

## 2013-09-13 MED ORDER — BUTAMBEN-TETRACAINE-BENZOCAINE 2-2-14 % EX AERO
INHALATION_SPRAY | CUTANEOUS | Status: DC | PRN
  Administered 2013-09-13: 2 via TOPICAL

## 2013-09-13 MED ORDER — FENTANYL CITRATE 0.05 MG/ML IJ SOLN
INTRAMUSCULAR | Status: DC | PRN
  Administered 2013-09-13 (×2): 25 ug via INTRAVENOUS

## 2013-09-13 MED ORDER — MIDAZOLAM HCL 10 MG/2ML IJ SOLN
INTRAMUSCULAR | Status: DC | PRN
  Administered 2013-09-13 (×2): 1 mg via INTRAVENOUS
  Administered 2013-09-13 (×2): 2 mg via INTRAVENOUS

## 2013-09-13 MED ORDER — FLEET ENEMA 7-19 GM/118ML RE ENEM: 1.0000 | ENEMA | Freq: Once | RECTAL | Status: AC | PRN

## 2013-09-13 MED ORDER — INSULIN ASPART 100 UNIT/ML ~~LOC~~ SOLN: 0.0000 [IU] | Freq: Every day | SUBCUTANEOUS | Status: DC

## 2013-09-13 MED ORDER — BISACODYL 10 MG RE SUPP: 10.0000 mg | Freq: Every day | RECTAL | Status: DC | PRN

## 2013-09-13 MED ORDER — SODIUM CHLORIDE 0.9 % IV SOLN: INTRAVENOUS | Status: DC

## 2013-09-13 NOTE — Progress Notes (Signed)
Endoscopy was well tolerated. Please see separate op note for findings and recommendations, which I have discussed with Dr. Sheran Fava.    This patient appears to be at low risk for further bleeding of any clinical significance.  She does not currently have a J-tube in place (see note by interventional radiology) so this may be a good time to consider surgical jejunostomy placement and/or permanent vascular access such as a Port-A-Cath, given her very challenging vascular access and tendency for frequent hospitalizations.  Cleotis Nipper, M.D. [phone]

## 2013-09-13 NOTE — ED Notes (Signed)
IV team was unable to get IV, searched surface veins as well as used [place] to search deep veins.  EDP at the bedside and aware of this.  Await further orders.  Pt given emesis bag.

## 2013-09-13 NOTE — ED Notes (Signed)
Pt states that she took her BP meds last night but vomited them back up.  BP elevated.  Pt attempted to give urine sample but was unable. She is aware that urine has been ordered

## 2013-09-13 NOTE — Progress Notes (Signed)
INITIAL NUTRITION ASSESSMENT  DOCUMENTATION CODES Per approved criteria  -Not Applicable   INTERVENTION: -As tolerated, Initiate Vital AF 1.2 @ 20 ml/hr via  and increase by 10 ml every 4 hours to goal rate of 50 ml/hr. At goal rate, tube feeding regimen will provide 1440 kcal, 90 grams of protein, and 973 ml of H2O.  -Regimen will provide 90% est kcal needs, 100% est protein needs -Recommend Glucerna shakes PRN -Diet advancement per MD -Will continue to monitor  NUTRITION DIAGNOSIS: Inadequate oral intake related to inability to eat as evidenced by NPO status.   Goal: Pt to meet >/= 90% of their estimated nutrition needs    Monitor:  TF order/tolerance, total protein/energy intake, GI profile, labs, weights  Reason for Assessment: MST  43 y.o. female  Admitting Dx: <principal problem not specified>  ASSESSMENT: The patient is a 43 y.o. year-old female with history of T1DM with peripheral neuropathy and diabetic gastroparesis status post gastrostomy tube placement, coronary artery disease status post MI and stent placement, hypertension, hyperlipidemia, COPD who presents with nausea, vomiting , and abdominal pain. The patient has been hospitalized twice in the last month and half, and this is her third admission. The patient was last at their baseline health about 4 days ago. She states that she started developing nausea with vomiting, occurring approximately every hour with burgundy blood intermittently. She denies bilious emesis. Her last BM was soft, not watery, and was last night. She has also had some associated upper abdominal pain that is worse with vomiting and some lower chest pain which is also exacerbated by vomiting  -Pt with hx of TF intolerance d/t nausea/vomiting and   refusing bolus tube feeding. Per previous medical records, pt may be more willing to try continuous feedings over home bolus regimen -Pt was receiving Vital 1.2 AF at 237 ml Q3H. Pt was in endo during  time of assessment; was unable to confirm if pt complying with home TF regimen and/or if pt had made adjustments in dosage/frequency -Radiology has recommended placement of surgical jejunostomy d/t recurring problems with jejunal tube dislodgment -Per RN, no plans for initiating TF today d/t nausea. Will place recommendations to implement as warranted -Sees gastroparesis specialist at [facility] -Usual weight of 149 lbs per previous RD assessment -Drinks Glucerna shakes occasionally. Allow as tolerated   Height: Ht Readings from Last 1 Encounters:  09/13/13 5\' 4"  (1.626 m)    Weight: Wt Readings from Last 1 Encounters:  09/13/13 146 lb 6.2 oz (66.4 kg)    Ideal Body Weight: 120 lbs  % Ideal Body Weight: 122%  Wt Readings from Last 10 Encounters:  09/13/13 146 lb 6.2 oz (66.4 kg)  09/13/13 146 lb 6.2 oz (66.4 kg)  08/14/13 144 lb 4.8 oz (65.454 kg)  08/03/13 152 lb 5.4 oz (69.1 kg)  05/27/13 141 lb 12.1 oz (64.3 kg)  05/07/13 142 lb 13.7 oz (64.8 kg)  04/11/13 135 lb 2.3 oz (61.3 kg)  02/17/13 139 lb 1.8 oz (63.1 kg)  02/17/13 139 lb 1.8 oz (63.1 kg)  02/06/13 141 lb 1.5 oz (64 kg)    Usual Body Weight: 149 lbs  % Usual Body Weight: 98%  BMI:  Body mass index is 25.11 kg/(m^2).  Estimated Nutritional Needs: Kcal: 1600-1800  Protein: 80-95 gram  Fluid: 2100 ml//daily   Skin: WDL  Diet Order: NPO  EDUCATION NEEDS: -No education needs identified at this time   Intake/Output Summary (Last 24 hours) at 09/13/13 1449 Last data  filed at 09/13/13 1300  Gross per 24 hour  Intake      0 ml  Output   1600 ml  Net  -1600 ml    Last BM: PTA   Labs:   Recent Labs Lab 09/13/13 0805  NA 129*  K 3.7  CL 84*  CO2 24  BUN 14  CREATININE 0.54  CALCIUM 9.7  GLUCOSE 489*    CBG (last 3)   Recent Labs  09/13/13 1101 09/13/13 1432  GLUCAP 398* 280*    Scheduled Meds: . insulin aspart  0-5 Units Subcutaneous QHS  . insulin aspart  0-9 Units  Subcutaneous TID WC  . insulin glargine  10 Units Subcutaneous QHS  . metoCLOPramide (REGLAN) injection  10 mg Intravenous Once  . metoCLOPramide (REGLAN) injection  10 mg Intravenous 3 times per day  . metoprolol  10 mg Intravenous Once  . mometasone-formoterol  2 puff Inhalation BID  . pantoprazole (PROTONIX) IV  40 mg Intravenous BID  . sodium chloride  3 mL Intravenous Q12H    Continuous Infusions: . sodium chloride 125 mL/hr (09/13/13 1200)  . sodium chloride      Past Medical History  Diagnosis Date  . Hypertension   . 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 Gastroparesis due to DM   . GERD (gastroesophageal reflux disease)   . Coronary artery disease   . Diabetic neuropathy, painful   . Chronic pancreatitis   . Dyslipidemia   . MI (myocardial infarction)   . COPD (chronic obstructive pulmonary disease)   . Seizures   . Diverticulosis     s/p partial bowel resection    Past Surgical History  Procedure Laterality Date  . Abdominal hysterectomy    . Cholecystectomy    . Peg tube x 4      feeding jejunostomies with PEG tubes  . Cesarean section    . Colon resection due to diverticulitis    . Back surgery    . Esophagogastroduodenoscopy N/A 12/27/2012    Procedure: ESOPHAGOGASTRODUODENOSCOPY (EGD);  Surgeon: Missy Sabins, MD;  Location: [facility];  Service: Endoscopy;  Laterality: N/A;  . Esophagogastroduodenoscopy (egd) with esophageal dilation N/A 02/19/2013    Procedure: ESOPHAGOGASTRODUODENOSCOPY (EGD) WITH ESOPHAGEAL DILATION;  Surgeon: Wonda Horner, MD;  Location: [facility];  Service: Endoscopy;  Laterality: N/A;  . Balloon dilation N/A 02/19/2013    Procedure: BALLOON DILATION;  Surgeon: Wonda Horner, MD;  Location: [facility];  Service: Endoscopy;  Laterality: N/A;  . Coronary stent placement      2 [place]    Atlee Abide [place] LDN Clinical Dietitian [id number]

## 2013-09-13 NOTE — ED Notes (Signed)
IV team notified for IV access, MD aware

## 2013-09-13 NOTE — ED Notes (Signed)
Admitting MD at the bedside.  

## 2013-09-13 NOTE — ED Notes (Signed)
Patient transported to X-ray 

## 2013-09-13 NOTE — ED Notes (Signed)
Pt with hx of pancreatitis is here for nausea, vomiting and abdominal pain which began yesterday, per EMS pancreatitis flare up

## 2013-09-13 NOTE — ED Notes (Signed)
Pt attempted to get urine sample again.  Blood drawn but IV unable to advance.  Will attempt second time

## 2013-09-13 NOTE — H&P (Addendum)
[facility] History and Physical  [name] [id number] DOB: [birth year] DOA: 09/13/2013  Referring physician:  Leonard Schwartz PCP:  [name], MD   Chief Complaint:  Nausea, vomiting, and abdominal pain  HPI:  The patient is a 43 y.o. year-old female with history of T1DM with peripheral neuropathy and diabetic gastroparesis status post gastrostomy tube placement, coronary artery disease status post MI and stent placement, hypertension, hyperlipidemia, COPD who presents with nausea, vomiting , and abdominal pain.  The patient has been hospitalized twice in the last month and half, and this is her third admission. The patient was last at their baseline health about 4 days ago.  She states that she started developing nausea with vomiting, occurring approximately every hour with burgundy blood intermittently. She denies bilious emesis.  Her last BM was soft, not watery, and was last night.  She has also had some associated upper abdominal pain that is worse with vomiting and some lower chest pain which is also exacerbated by vomiting.  She denies shortness of breath but has had a mild cough. Denies sinus congestion rhinorrhea, dysuria. She states that she was unable to take her medications this morning but she did take her long-acting insulin last night. She came to the emergency department because she felt she was getting hydrated.  In the emergency department, vital signs were notable for a blood pressure of up to 219/127 which came down spontaneously to 182/104, heart rate in the 110s to 120s, afebrile breathing comfortably on room air. Her labs were notable for white blood cell count of 16.5, sodium 129, chloride 84, glucose 489. Chest and abdominal x-rays were notable for gastrostomy catheter which was looped into the mid to distal esophagus with the tip lying within the stomach, and no pneumonia or bowel abnormalities. She had an ultrasound-guided ID placed in the emergency department. She  was given IV Protonix, normal saline bolus, metoprolol, Reglan, and insulin in the emergency department.    Review of Systems:  General:  Positive chills, weight loss or gain HEENT:  Denies changes to hearing and vision, rhinorrhea, sinus congestion, sore throat CV:  Denies palpitations, lower extremity edema.  PULM:  Denies SOB, wheezing GI:  Denies nausea, vomiting, constipation, diarrhea.   GU:  Denies dysuria, frequency, urgency ENDO:  Denies polyuria, polydipsia.   HEME:  Denies blood in stools, melena, abnormal bruising or bleeding.  LYMPH:  Denies lymphadenopathy.   MSK:  Denies arthralgias, myalgias.   DERM:  Denies skin rash or ulcer.  Lanced boil which is healing on her left breast NEURO:  Denies focal numbness, weakness, slurred speech, confusion, facial droop.  PSYCH:  Denies anxiety and depression.    Past Medical History  Diagnosis Date  . Hypertension   . 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 Gastroparesis due to DM   . GERD (gastroesophageal reflux disease)   . Coronary artery disease   . Diabetic neuropathy, painful   . Chronic pancreatitis   . Dyslipidemia   . MI (myocardial infarction)   . COPD (chronic obstructive pulmonary disease)   . Seizures   . Diverticulosis     s/p partial bowel resection   Past Surgical History  Procedure Laterality Date  . Abdominal hysterectomy    . Cholecystectomy    . Peg tube x 4      feeding jejunostomies with PEG tubes  . Cesarean section    . Colon resection due to diverticulitis    . Back surgery    .  Esophagogastroduodenoscopy N/A 12/27/2012    Procedure: ESOPHAGOGASTRODUODENOSCOPY (EGD);  Surgeon: Missy Sabins, MD;  Location: [facility];  Service: Endoscopy;  Laterality: N/A;  . Esophagogastroduodenoscopy (egd) with esophageal dilation N/A 02/19/2013    Procedure: ESOPHAGOGASTRODUODENOSCOPY (EGD) WITH ESOPHAGEAL DILATION;  Surgeon: Wonda Horner, MD;  Location: [facility];  Service:  Endoscopy;  Laterality: N/A;  . Balloon dilation N/A 02/19/2013    Procedure: BALLOON DILATION;  Surgeon: Wonda Horner, MD;  Location: [facility];  Service: Endoscopy;  Laterality: N/A;  . Coronary stent placement      2 [place]   Social History:  reports that she quit smoking about 6 weeks ago. Her smoking use included Cigarettes and Cigars. She has a 1.5 pack-year smoking history. She has never used smokeless tobacco. She reports that she does not drink alcohol or use illicit drugs. LIves with her sister.  She is disability.  Does not use assist device.  [facility] RN and aid.    Allergies  Allergen Reactions  . Cefadroxil Other (See Comments)    Not sure of effects-listed as allergy on list from home  . Compazine [Prochlorperazine Edisylate] Hives  . Darvocet [Propoxyphene N-Acetaminophen] Other (See Comments)    UNKNOWN  . Zofran [Ondansetron Hcl] Hives  . Compazine Hives    Tolerates promethazine  . Nsaids Hives  . Penicillins Hives  . Zofran [Ondansetron] Hives    Family History  Problem Relation Age of Onset  . Endometrial cancer Mother   . Diabetes Mellitus II Maternal Grandmother   . Diabetes Mellitus II Mother   . Diabetes Mellitus II Sister   . Emphysema Mother   . Heart attack Father      Prior to Admission medications   Medication Sig Start Date End Date Taking? Authorizing Provider  acetaminophen (TYLENOL) 160 MG/5ML solution Place 20.3 mLs (650 mg total) into feeding tube 3 (three) times daily. 05/29/13  Yes Robbie Lis, MD  atorvastatin (LIPITOR) 40 MG tablet Place 1 tablet (40 mg total) into feeding tube daily at 6 PM. 02/20/13  Yes Robbie Lis, MD  busPIRone (BUSPAR) 5 MG tablet Place 5 mg into feeding tube 3 (three) times daily.   Yes Historical Provider, MD  Fluticasone-Salmeterol (ADVAIR) 250-50 MCG/DOSE AEPB Inhale 1 puff into the lungs 2 (two) times daily. 12/02/11  Yes Robbie Lis, MD  gabapentin (NEURONTIN) 250 MG/5ML solution Place 12 mLs (600  mg total) into feeding tube every 8 (eight) hours. 08/19/13  Yes Allie Bossier, MD  insulin aspart (NOVOLOG) 100 UNIT/ML injection Inject 0-9 Units into the skin every 4 (four) hours. On sliding scale 05/29/13  Yes Robbie Lis, MD  insulin glargine (LANTUS) 100 UNIT/ML injection Inject 10 Units into the skin at bedtime.    Yes Historical Provider, MD  labetalol (NORMODYNE) 200 MG tablet Place 1 tablet (200 mg total) into feeding tube 2 (two) times daily. 08/19/13  Yes Allie Bossier, MD  lisinopril (PRINIVIL,ZESTRIL) 10 MG tablet Place 10 mg into feeding tube daily.  03/29/13  Yes Historical Provider, MD  magnesium oxide (MAG-OX) 400 (241.3 MG) MG tablet Place 1 tablet (400 mg total) into feeding tube daily. 08/19/13  Yes Allie Bossier, MD  metoCLOPramide (REGLAN) 5 MG/5ML solution Take 10 mLs (10 mg total) by mouth 3 (three) times daily before meals. 05/29/13  Yes Robbie Lis, MD  mirtazapine (REMERON) 15 MG tablet Place 15 mg into feeding tube at bedtime.   Yes Historical Provider, MD  Multiple Vitamin (MULTIVITAMIN) LIQD Place 5 mLs into feeding tube daily. 05/07/13  Yes Hosie Poisson, MD  Nutritional Supplements (FEEDING SUPPLEMENT, VITAL AF 1.2 CAL,) LIQD Place 237 mLs into feeding tube every 3 (three) hours while awake. 05/29/13  Yes Robbie Lis, MD  oxyCODONE (OXY IR/ROXICODONE) 5 MG immediate release tablet Take 1 tablet (5 mg total) by mouth every 4 (four) hours as needed for moderate pain. 08/19/13  Yes Allie Bossier, MD  pantoprazole sodium (PROTONIX) 40 mg/20 mL PACK Place 20 mLs (40 mg total) into feeding tube 2 (two) times daily. 08/19/13  Yes Allie Bossier, MD  promethazine (PHENERGAN) 25 MG tablet Place 1 tablet (25 mg total) into feeding tube every 6 (six) hours as needed for nausea or vomiting. 08/19/13  Yes Allie Bossier, MD  sucralfate (CARAFATE) 1 GM/10ML suspension Place 10 mLs (1 g total) into feeding tube 4 (four) times daily -  with meals and at bedtime. 05/13/13  Yes Hoy Morn, MD  zolpidem (AMBIEN) 10 MG tablet Place 10 mg into feeding tube at bedtime. 05/22/13  Yes Historical Provider, MD   Physical Exam: Filed Vitals:   09/13/13 0751 09/13/13 0845 09/13/13 0958 09/13/13 1107  BP: 219/127 208/119 177/107 182/104  Pulse: 125 117 118 116  Temp: 99 F (37.2 C)  98.4 F (36.9 C) 98.9 F (37.2 C)  TempSrc: Oral  Oral Oral  Resp: 24  20 20   SpO2: 100% 98% 97% 98%     General:  AAF, NAD  Eyes:  PERRL, anicteric, non-injected.  ENT:  Nares clear.  OP clear, non-erythematous without plaques or exudates.  MMM.  Neck:  Supple without TM or JVD.    Lymph:  No cervical, supraclavicular, or submandibular LAD.  Cardiovascular:  Tachycardic, RR, normal S1, S2, without m/r/g.  2+ pulses, warm extremities  Respiratory:  CTA bilaterally without increased WOB.  Abdomen:  NABS.  Soft, ND, TTP in the bilateral upper quadrants and epigastric area without rebound or guarding.  Gastrostomy tube intact, no discharge surrounding and no blood in tube  Skin:  Left chest abscess dressing c/d/i  Musculoskeletal:  Normal bulk and tone.  No LE edema.  Psychiatric:  A & O x 4.  Appropriate affect.  Neurologic:  CN 3-12 intact.  5/5 strength.  Sensation intact.  Labs on Admission:  Basic Metabolic Panel:  Recent Labs Lab 09/13/13 0805  NA 129*  K 3.7  CL 84*  CO2 24  GLUCOSE 489*  BUN 14  CREATININE 0.54  CALCIUM 9.7   Liver Function Tests:  Recent Labs Lab 09/13/13 0805  AST 18  ALT 12  ALKPHOS 136*  BILITOT <0.2*  PROT 8.8*  ALBUMIN 3.1*    Recent Labs Lab 09/13/13 0805  LIPASE 31   No results found for this basename: AMMONIA,  in the last 168 hours CBC:  Recent Labs Lab 09/13/13 0805  WBC 16.5*  NEUTROABS 13.8*  HGB 12.5  HCT 36.5  MCV 85.5  PLT 352   Cardiac Enzymes: No results found for this basename: CKTOTAL, CKMB, CKMBINDEX, TROPONINI,  in the last 168 hours  BNP (last 3 results) No results found for this basename:  PROBNP,  in the last 8760 hours CBG:  Recent Labs Lab 09/13/13 1101  GLUCAP 398*    Radiological Exams on Admission: Dg Abd Acute W/chest  09/13/2013   CLINICAL DATA:  Cough and congestion  EXAM: ACUTE ABDOMEN SERIES (ABDOMEN 2 VIEW & CHEST 1 VIEW)  COMPARISON:  08/15/2013  FINDINGS: Cardiac shadow is within normal limits. The lungs are well aerated without focal infiltrate. The previously seen right-sided PICC line is been removed in the interval. The patient again shows a gastrostomy catheter with a small bowel extension although the extension is now coiled within the mid to distal esophagus. Scattered large and small bowel gas is noted. Postoperative changes are again seen. No obstructive changes are noted.  IMPRESSION: Gastrostomy catheter with a small bowel extension which is looped within the mid to distal esophagus. The tip lies within the stomach.  No other focal abnormality is seen.   Electronically Signed   By: Inez Catalina M.D.   On: 09/13/2013 09:33    EKG: Independently reviewed. pending  Assessment/Plan Active Problems:   Intractable nausea and vomiting   Dyslipidemia   GERD (gastroesophageal reflux disease)   Hypertension   DM (diabetes mellitus), type 2, uncontrolled   Dehydration   Hyponatremia   Leukocytosis, unspecified   Abdominal pain   Diabetic neuropathy   Hyperglycemia   Hypochloremia   Hemoptysis   Diabetic gastroparesis  ---  Intractable nausea and vomiting likely secondary to recurrent gastroparesis  Other differential includes pancreatitis. Her lipase was normal, but this can be low normal even in the setting of a pancreatitis flare.  Her symptoms may be exacerbated by the displacement of her gastrostomy tube.  Hematemesis could be from a Mallory-Weiss tear, gastritis, erosions secondary to displaced gastrostomy tube. -  N.p.o. -  Protonix twice a day -  Interventional radiology consult for replacement of her feeding tube:  G-tube only -  General  surgery consult for surgical J-tube placement (and possible port-o-cath) - NOT called yet as it is after 5pm and this is not emergency, but consult placed in computer.  Please call consult in AM.   -  Gastroenterology consult for her gastroparesis flare and hematemesis, last endoscopy was last year -  Schedule Reglan -  Phenergan when necessary -  Consider erythromycin if symptoms not improving in next few days -  Hemoglobin is normal currently so will repeat in the morning  HTN, likely due to nausea and pain -  Start scheduled metoprolol -  Start hydralazine prn -  Control pain and nausea -  Restart home medications when able  T1DM  A1c 7.1 1 month ago -  Continue lantus 10 units -  Low dose SSI with HS insulin  Coronary artery disease with chest pain, however chest pain is atypical it is more likely related to displaced gastrostomy tube or Mallory-Weiss tear  -   Telemetry  -  Cycle troponins  -  IV beta blocker  -  Hold oral beta blocker and statin -  Consider plavix once tolerating PO   Abdominal pain, likely secondary to muscle strain from recurrent vomiting -  As needed IV Dilaudid until tolerating oral medications or until feeding tube is replaced  Hyponatremia, hypochloremia are likely secondary to dehydration from vomiting -  IV fluids - Repeat electrolytes in the morning  Diabetic neuropathy  -  Hold gabapentin until feeding tube replaced or repositioned   COPD, breathing is stable on RA -  Continue ICS/LABA  Leukocytosis, likely due to dehydration, CXR neg,  -  IVF and repeat in AM -  F/u UA  Left chest abscess, healing, continue dressing changes  Diet:  NPO Access:  PIV - lab unable to obtain BW.  Has received many PICC lines and now only able to get them by  IR.  Given frequency of hospitalization, consider Port-o-cath.  Will consult Gen Surg IVF:  yes Proph:  SCDs  Code Status: Full Family Communication: patient alone Disposition Plan: Admit to  telemetry  Time spent: 60 min Madason Rauls, [facility] Pager [phone]  If 7PM-7AM, please contact night-coverage [email or web address] Password [facility] 09/13/2013, 11:37 AM

## 2013-09-13 NOTE — ED Provider Notes (Signed)
CSN: [id number]     Arrival date & time 09/13/13  [id number] History   First MD Initiated Contact with Patient 09/13/13 0745     Chief Complaint  Patient presents with  . Pancreatitis      HPI Patient presents with long chronic history of on persistent nausea and vomiting and gastroparesis.  She has an indwelling gastric tube.  She's had numerous IVs and PICC line was placed in the past.  Denies any fever or chills.  Says her, pain associated with nausea and vomiting began yesterday.  She has history of pancreatitis.  She denies drinking. Past Medical History  Diagnosis Date  . Hypertension   . Diabetes mellitus   . Gastroparesis   . Asthma   . GERD (gastroesophageal reflux disease)   . Coronary artery disease   . h/o seizure   . Neuropathy   . Chronic pancreatitis   . Dyslipidemia   . MI (myocardial infarction)   . COPD (chronic obstructive pulmonary disease)   . Seizures    Past Surgical History  Procedure Laterality Date  . Abdominal hysterectomy    . Cholecystectomy    . Peg tube x 4      feeding jejunostomies with PEG tubes  . Cesarean section    . Colon resection due to diverticulitis    . Back surgery    . Esophagogastroduodenoscopy N/A 12/27/2012    Procedure: ESOPHAGOGASTRODUODENOSCOPY (EGD);  Surgeon: Missy Sabins, MD;  Location: [facility];  Service: Endoscopy;  Laterality: N/A;  . Esophagogastroduodenoscopy (egd) with esophageal dilation N/A 02/19/2013    Procedure: ESOPHAGOGASTRODUODENOSCOPY (EGD) WITH ESOPHAGEAL DILATION;  Surgeon: Wonda Horner, MD;  Location: [facility];  Service: Endoscopy;  Laterality: N/A;  . Balloon dilation N/A 02/19/2013    Procedure: BALLOON DILATION;  Surgeon: Wonda Horner, MD;  Location: [facility];  Service: Endoscopy;  Laterality: N/A;   Family History  Problem Relation Age of Onset  . Cancer Mother    History  Substance Use Topics  . Smoking status: Former Smoker -- 0.25 packs/day for 2 years    Types: Cigarettes  .  Smokeless tobacco: Never Used  . Alcohol Use: No   OB History   Grav Para Term Preterm Abortions TAB SAB Ect Mult Living                 Review of Systems  All other systems reviewed and are negative  Allergies  Cefadroxil; Compazine; Darvocet; Zofran; Compazine; Nsaids; Penicillins; and Zofran  Home Medications   Current Outpatient Rx  Name  Route  Sig  Dispense  Refill  . acetaminophen (TYLENOL) 160 MG/5ML solution   Per Tube   Place 20.3 mLs (650 mg total) into feeding tube 3 (three) times daily.   120 mL   0   . atorvastatin (LIPITOR) 40 MG tablet   Per Tube   Place 1 tablet (40 mg total) into feeding tube daily at 6 PM.   30 tablet   0   . busPIRone (BUSPAR) 5 MG tablet   Per Tube   Place 5 mg into feeding tube 3 (three) times daily.         . Fluticasone-Salmeterol (ADVAIR) 250-50 MCG/DOSE AEPB   Inhalation   Inhale 1 puff into the lungs 2 (two) times daily.   60 each   0   . gabapentin (NEURONTIN) 250 MG/5ML solution   Per Tube   Place 12 mLs (600 mg total) into feeding tube every 8 (eight) hours.  473 mL   0   . insulin aspart (NOVOLOG) 100 UNIT/ML injection   Subcutaneous   Inject 0-9 Units into the skin every 4 (four) hours. On sliding scale         . insulin glargine (LANTUS) 100 UNIT/ML injection   Subcutaneous   Inject 10 Units into the skin at bedtime.          Marland Kitchen labetalol (NORMODYNE) 200 MG tablet   Per Tube   Place 1 tablet (200 mg total) into feeding tube 2 (two) times daily.   60 tablet   0   . lisinopril (PRINIVIL,ZESTRIL) 10 MG tablet   Per Tube   Place 10 mg into feeding tube daily.          . magnesium oxide (MAG-OX) 400 (241.3 MG) MG tablet   Per Tube   Place 1 tablet (400 mg total) into feeding tube daily.   30 tablet   0   . metoCLOPramide (REGLAN) 5 MG/5ML solution   Oral   Take 10 mLs (10 mg total) by mouth 3 (three) times daily before meals.   120 mL   0   . mirtazapine (REMERON) 15 MG tablet   Per  Tube   Place 15 mg into feeding tube at bedtime.         . Multiple Vitamin (MULTIVITAMIN) LIQD   Per Tube   Place 5 mLs into feeding tube daily.   60 mL   1   . Nutritional Supplements (FEEDING SUPPLEMENT, VITAL AF 1.2 CAL,) LIQD   Per Tube   Place 237 mLs into feeding tube every 3 (three) hours while awake.   1 Can   1   . oxyCODONE (OXY IR/ROXICODONE) 5 MG immediate release tablet   Oral   Take 1 tablet (5 mg total) by mouth every 4 (four) hours as needed for moderate pain.   20 tablet   0   . pantoprazole sodium (PROTONIX) 40 mg/20 mL PACK   Per Tube   Place 20 mLs (40 mg total) into feeding tube 2 (two) times daily.   30 each   0   . promethazine (PHENERGAN) 25 MG tablet   Per Tube   Place 1 tablet (25 mg total) into feeding tube every 6 (six) hours as needed for nausea or vomiting.   30 tablet   0   . sucralfate (CARAFATE) 1 GM/10ML suspension   Per Tube   Place 10 mLs (1 g total) into feeding tube 4 (four) times daily -  with meals and at bedtime.   420 mL   0   . zolpidem (AMBIEN) 10 MG tablet   Per Tube   Place 10 mg into feeding tube at bedtime.          BP 177/107  Pulse 118  Temp(Src) 98.4 F (36.9 C) (Oral)  Resp 20  SpO2 97% Physical Exam  Nursing note and vitals reviewed. Constitutional: She is oriented to person, place, and time. She appears well-developed and well-nourished. No distress.  HENT:  Head: Normocephalic and atraumatic.  Eyes: Pupils are equal, round, and reactive to light.  Neck: Normal range of motion.  Cardiovascular: Normal rate and intact distal pulses.   Pulmonary/Chest: No respiratory distress.  Abdominal: Normal appearance. She exhibits no distension. There is tenderness. There is no rigidity, no rebound and no guarding.    Musculoskeletal: Normal range of motion.  Neurological: She is alert and oriented to person, place, and time. No cranial nerve  deficit.  Skin: Skin is warm and dry. No rash noted.   Psychiatric: She has a normal mood and affect. Her behavior is normal.    ED Course  Procedures (including critical care time)  Ultrasound guided basilic vein cannulation done using sterile technique in right arm.  Medications  sodium chloride 0.9 % bolus 500 mL (500 mLs Intravenous New Bag/Given 09/13/13 1032)  insulin aspart (novoLOG) injection 9 Units (not administered)  metoCLOPramide (REGLAN) injection 10 mg (10 mg Intravenous Given 09/13/13 1032)  HYDROmorphone (DILAUDID) injection 2 mg (2 mg Intramuscular Given 09/13/13 0908)    Labs Review Labs Reviewed  COMPREHENSIVE METABOLIC PANEL - Abnormal; Notable for the following:    Sodium 129 (*)    Chloride 84 (*)    Glucose, Bld 489 (*)    Total Protein 8.8 (*)    Albumin 3.1 (*)    Alkaline Phosphatase 136 (*)    Total Bilirubin <0.2 (*)    All other components within normal limits  CBC WITH DIFFERENTIAL - Abnormal; Notable for the following:    WBC 16.5 (*)    Neutrophils Relative % 83 (*)    Neutro Abs 13.8 (*)    Monocytes Relative 2 (*)    All other components within normal limits  BLOOD GAS, ARTERIAL - Abnormal; Notable for the following:    pH, Arterial 7.505 (*)    pO2, Arterial 77.3 (*)    Bicarbonate 28.9 (*)    Acid-Base Excess 5.9 (*)    All other components within normal limits  LIPASE, BLOOD  URINALYSIS, ROUTINE W REFLEX MICROSCOPIC  URINE RAPID DRUG SCREEN (HOSP PERFORMED)  KETONES, QUALITATIVE  LACTIC ACID, PLASMA    Anion gap = 21 Imaging Review Dg Abd Acute W/chest  09/13/2013   CLINICAL DATA:  Cough and congestion  EXAM: ACUTE ABDOMEN SERIES (ABDOMEN 2 VIEW & CHEST 1 VIEW)  COMPARISON:  08/15/2013  FINDINGS: Cardiac shadow is within normal limits. The lungs are well aerated without focal infiltrate. The previously seen right-sided PICC line is been removed in the interval. The patient again shows a gastrostomy catheter with a small bowel extension although the extension is now coiled within the mid  to distal esophagus. Scattered large and small bowel gas is noted. Postoperative changes are again seen. No obstructive changes are noted.  IMPRESSION: Gastrostomy catheter with a small bowel extension which is looped within the mid to distal esophagus. The tip lies within the stomach.  No other focal abnormality is seen.   Electronically Signed   By: Inez Catalina M.D.   On: 09/13/2013 09:33    EKG Interpretation   None       CRITICAL CARE Performed by: Leonard Schwartz L Total critical care time: 30 min Critical care time was exclusive of separately billable procedures and treating other patients. Critical care was necessary to treat or prevent imminent or life-threatening deterioration. Critical care was time spent personally by me on the following activities: development of treatment plan with patient and/or surrogate as well as nursing, discussions with consultants, evaluation of patient's response to treatment, examination of patient, obtaining history from patient or surrogate, ordering and performing treatments and interventions, ordering and review of laboratory studies, ordering and review of radiographic studies, pulse oximetry and re-evaluation of patient's condition.   MDM   Final diagnoses:  Diabetic gastroparesis  Intractable nausea and vomiting  High anion gap metabolic acidosis        Dot Lanes, MD 09/13/13 1036

## 2013-09-13 NOTE — ED Notes (Signed)
Attempt IV placement for second time without success in [place] with 22g, blood returnbut unable to flush.  2nd RN to attempt. Pt notified that urine sample is needed

## 2013-09-13 NOTE — Consult Note (Signed)
Referring Provider: Dr. Janece Canterbury (triad hospitalist) Primary Care Physician:  [name], MD Primary Gastroenterologist:  None (unassigned)  PLEASE NOTE this patient is been seen today as a courtesy in view of the fact that she was just seen by our group last month. She has never been seen in our office and is no longer followed at [facility] internal medicine. She should therefore be considered unassigned on future admissions.   Reason for Consultation:  Hematemesis, nausea and vomiting, gastroparesis  HPI: [name] is a 43 y.o. female formerly followed at [facility] internal medicine, and seen by multiple members of my practice, most recently 3 weeks ago, because of recurrent nausea and vomiting.  The patient has a long-standing history of insulin-dependent diabetes, with multiple complications including coronary disease, neuropathy, and visual impairment.  She was most recently in the hospital about 3 weeks ago with nausea, vomiting, and small-volume hematemesis, and was seen in consultation but not endoscoped at that time. Her most recent endoscopy was about 6 months ago, in July of 2014, at which time there was retained food contents but no source of hematemesis identified, and no anatomic gastric outlet obstruction.  She presented to the emergency room today with her glucose out of control and her jejunal feeding tube vomited up into her esophagus, based on radiographic appearance. The J-tube has been removed and a new G-tube placed, but currently she has not had replacement of her jejunal feeding tube. Interventional radiology has recommended consideration of placement of a surgical jejunostomy because of recurring problems with jejunal tube dislodgment.  The patient indicates that she has been seen by Dr. Milderd Meager at [facility] who is an expert in gastroparesis. She is not clear on what the therapeutic plan for her condition is, but she indicates that she does have an appointment to  go back and see him.  The patient supplements or dietary intake with tube feedings. She indicates that she has to be very careful what she eats. She has been averaging an admission each month to this hospital.  She is not maintained on medications, such as narcotics, which might cause impairment of gastric emptying.  Today, she developed significant vomiting, superimposed on a prodrome of nausea, and by her report vomited up moderate amounts of blood, although it is difficult to interpret that in view of the fact that she is visually impaired. In fact, her current admission hemoglobin of 12.5 presumably reflects an element of hemoconcentration, because of months earlier, in hospital discharge, her hemoglobin was 9.3.                                                                                    Past Medical History  Diagnosis Date  . Hypertension   . 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 Gastroparesis due to DM   . GERD (gastroesophageal reflux disease)   . Coronary artery disease   . Diabetic neuropathy, painful   . Chronic pancreatitis   . Dyslipidemia   . MI (myocardial infarction)   . COPD (chronic obstructive pulmonary disease)   . Seizures   . Diverticulosis     s/p partial bowel resection  Past Surgical History  Procedure Laterality Date  . Abdominal hysterectomy    . Cholecystectomy    . Peg tube x 4      feeding jejunostomies with PEG tubes  . Cesarean section    . Colon resection due to diverticulitis    . Back surgery    . Esophagogastroduodenoscopy N/A 12/27/2012    Procedure: ESOPHAGOGASTRODUODENOSCOPY (EGD);  Surgeon: Missy Sabins, MD;  Location: [facility];  Service: Endoscopy;  Laterality: N/A;  . Esophagogastroduodenoscopy (egd) with esophageal dilation N/A 02/19/2013    Procedure: ESOPHAGOGASTRODUODENOSCOPY (EGD) WITH ESOPHAGEAL DILATION;  Surgeon: Wonda Horner, MD;  Location: [facility];  Service: Endoscopy;  Laterality:  N/A;  . Balloon dilation N/A 02/19/2013    Procedure: BALLOON DILATION;  Surgeon: Wonda Horner, MD;  Location: [facility];  Service: Endoscopy;  Laterality: N/A;  . Coronary stent placement      2 st[place]    Prior to Admission medications   Medication Sig Start Date End Date Taking? Authorizing Provider  acetaminophen (TYLENOL) 160 MG/5ML solution Place 20.3 mLs (650 mg total) into feeding tube 3 (three) times daily. 05/29/13  Yes Robbie Lis, MD  atorvastatin (LIPITOR) 40 MG tablet Place 1 tablet (40 mg total) into feeding tube daily at 6 PM. 02/20/13  Yes Robbie Lis, MD  busPIRone (BUSPAR) 5 MG tablet Place 5 mg into feeding tube 3 (three) times daily.   Yes Historical Provider, MD  Fluticasone-Salmeterol (ADVAIR) 250-50 MCG/DOSE AEPB Inhale 1 puff into the lungs 2 (two) times daily. 12/02/11  Yes Robbie Lis, MD  gabapentin (NEURONTIN) 250 MG/5ML solution Place 12 mLs (600 mg total) into feeding tube every 8 (eight) hours. 08/19/13  Yes Allie Bossier, MD  insulin aspart (NOVOLOG) 100 UNIT/ML injection Inject 0-9 Units into the skin every 4 (four) hours. On sliding scale 05/29/13  Yes Robbie Lis, MD  insulin glargine (LANTUS) 100 UNIT/ML injection Inject 10 Units into the skin at bedtime.    Yes Historical Provider, MD  labetalol (NORMODYNE) 200 MG tablet Place 1 tablet (200 mg total) into feeding tube 2 (two) times daily. 08/19/13  Yes Allie Bossier, MD  lisinopril (PRINIVIL,ZESTRIL) 10 MG tablet Place 10 mg into feeding tube daily.  03/29/13  Yes Historical Provider, MD  magnesium oxide (MAG-OX) 400 (241.3 MG) MG tablet Place 1 tablet (400 mg total) into feeding tube daily. 08/19/13  Yes Allie Bossier, MD  metoCLOPramide (REGLAN) 5 MG/5ML solution Take 10 mLs (10 mg total) by mouth 3 (three) times daily before meals. 05/29/13  Yes Robbie Lis, MD  mirtazapine (REMERON) 15 MG tablet Place 15 mg into feeding tube at bedtime.   Yes Historical Provider, MD  Multiple Vitamin  (MULTIVITAMIN) LIQD Place 5 mLs into feeding tube daily. 05/07/13  Yes Hosie Poisson, MD  Nutritional Supplements (FEEDING SUPPLEMENT, VITAL AF 1.2 CAL,) LIQD Place 237 mLs into feeding tube every 3 (three) hours while awake. 05/29/13  Yes Robbie Lis, MD  oxyCODONE (OXY IR/ROXICODONE) 5 MG immediate release tablet Take 1 tablet (5 mg total) by mouth every 4 (four) hours as needed for moderate pain. 08/19/13  Yes Allie Bossier, MD  pantoprazole sodium (PROTONIX) 40 mg/20 mL PACK Place 20 mLs (40 mg total) into feeding tube 2 (two) times daily. 08/19/13  Yes Allie Bossier, MD  promethazine (PHENERGAN) 25 MG tablet Place 1 tablet (25 mg total) into feeding tube every 6 (six) hours as needed for nausea  or vomiting. 08/19/13  Yes Allie Bossier, MD  sucralfate (CARAFATE) 1 GM/10ML suspension Place 10 mLs (1 g total) into feeding tube 4 (four) times daily -  with meals and at bedtime. 05/13/13  Yes Hoy Morn, MD  zolpidem (AMBIEN) 10 MG tablet Place 10 mg into feeding tube at bedtime. 05/22/13  Yes Historical Provider, MD    Current Facility-Administered Medications  Medication Dose Route Frequency Provider Last Rate Last Dose  . 0.9 %  sodium chloride infusion   Intravenous Continuous Janece Canterbury, MD 125 mL/hr at 09/13/13 1200 125 mL/hr at 09/13/13 1200  . bisacodyl (DULCOLAX) suppository 10 mg  10 mg Rectal Daily PRN Janece Canterbury, MD      . HYDROmorphone (DILAUDID) injection 1 mg  1 mg Intravenous Q3H PRN Janece Canterbury, MD   1 mg at 09/13/13 1324  . insulin aspart (novoLOG) injection 0-5 Units  0-5 Units Subcutaneous QHS Janece Canterbury, MD      . insulin aspart (novoLOG) injection 0-9 Units  0-9 Units Subcutaneous TID WC Janece Canterbury, MD      . insulin glargine (LANTUS) injection 10 Units  10 Units Subcutaneous QHS Janece Canterbury, MD      . metoCLOPramide (REGLAN) injection 10 mg  10 mg Intravenous TID PRN Dot Lanes, MD      . metoCLOPramide (REGLAN) injection 10 mg  10 mg  Intravenous Once Dot Lanes, MD      . metoCLOPramide (REGLAN) injection 10 mg  10 mg Intravenous 3 times per day Janece Canterbury, MD      . metoprolol (LOPRESSOR) injection 10 mg  10 mg Intravenous Once Janece Canterbury, MD      . mometasone-formoterol (DULERA) 100-5 MCG/ACT inhaler 2 puff  2 puff Inhalation BID Janece Canterbury, MD      . pantoprazole (PROTONIX) injection 40 mg  40 mg Intravenous BID Janece Canterbury, MD      . promethazine (PHENERGAN) 25 mg in sodium chloride 0.9 % 50 mL infusion  25 mg Intravenous Q6H PRN Janece Canterbury, MD      . sodium chloride 0.9 % injection 3 mL  3 mL Intravenous Q12H Janece Canterbury, MD      . sodium phosphate (FLEET) 7-19 GM/118ML enema 1 enema  1 enema Rectal Once PRN Janece Canterbury, MD        Allergies as of 09/13/2013 - Review Complete 09/13/2013  Allergen Reaction Noted  . Cefadroxil Other (See Comments) 12/26/2012  . Compazine [prochlorperazine edisylate] Hives 09/13/2013  . Darvocet [propoxyphene n-acetaminophen] Other (See Comments) 01/13/2011  . Zofran [ondansetron hcl] Hives 09/13/2013  . Compazine Hives 01/13/2011  . Nsaids Hives 01/13/2011  . Penicillins Hives 01/13/2011  . Zofran [ondansetron] Hives 12/26/2012    Family History  Problem Relation Age of Onset  . Endometrial cancer Mother   . Diabetes Mellitus II Maternal Grandmother   . Diabetes Mellitus II Mother   . Diabetes Mellitus II Sister   . Emphysema Mother   . Heart attack Father     History   Social History  . Marital Status: Divorced    Spouse Name: N/A    Number of Children: N/A  . Years of Education: N/A   Occupational History  . unemployed    Social History Main Topics  . Smoking status: Former Smoker -- 0.25 packs/day for 6 years    Types: Cigarettes, Cigars    Quit date: 08/01/2013  . Smokeless tobacco: Never Used  . Alcohol Use: No  .  Drug Use: No  . Sexual Activity: No   Other Topics Concern  . Not on file   Social History Narrative    LIves with her sister.  She is disability.  Does not use assist device.  [employer] RN and aid.      Review of Systems: please see history of present illness.  Physical Exam: Vital signs in last 24 hours: Temp:  [98.1 F (36.7 C)-99 F (37.2 C)] 98.1 F (36.7 C) (02/13 1155) Pulse Rate:  [113-125] 113 (02/13 1155) Resp:  [18-24] 18 (02/13 1155) BP: (128-219)/(68-127) 128/68 mmHg (02/13 1155) SpO2:  [97 %-100 %] 97 % (02/13 1155) Weight:  [66.4 kg (146 lb 6.2 oz)] 66.4 kg (146 lb 6.2 oz) (02/13 1156)   General:  mildly overweight, very quiet, somewhat withdrawn African American female, appears slightly uncomfortable, but not in acute distress. Head:  Normocephalic and atraumatic. Eyes:  Sclera clear, no icterus.    Discoloration of the iris of right eye.  Mouth:   No ulcerations or lesions.  Oropharynx pink & moist. Neck:   No masses or thyromegaly. Lungs:  Clear throughout to auscultation.   No wheezes, crackles, or rhonchi. No evident respiratory distress. Heart:    Slightly rapid  rate, regular rhythm; no murmurs, clicks, rubs,  or gallops. Abdomen:   nondistended. G-tube in place. Nontympanitic. Bowel sounds quiet. Mild subjective tenderness, without guarding or peritoneal findings, rigidity or rebound Msk:   Symmetrical without gross deformities. Pulses:  Normal pulses noted. Extremities:   Without clubbing, cyanosis, or edema. Neurologic: Alert and coherent;  grossly normal neurologically. Skin:  Intact without significant lesions or rashes. Cervical Nodes:  No significant cervical adenopathy. Psych:    somewhat withdrawn and depressed-appearing. .  Intake/Output from previous day:   Intake/Output this shift: Total I/O In: -  Out: 1600 [Urine:500; Emesis/NG output:1100]  Lab Results:  Recent Labs  09/13/13 0805  WBC 16.5*  HGB 12.5  HCT 36.5  PLT 352   BMET  Recent Labs  09/13/13 0805  NA 129*  K 3.7  CL 84*  CO2 24  GLUCOSE 489*  BUN 14  CREATININE 0.54   CALCIUM 9.7   LFT  Recent Labs  09/13/13 0805  PROT 8.8*  ALBUMIN 3.1*  AST 18  ALT 12  ALKPHOS 136*  BILITOT <0.2*   PT/INR No results found for this basename: LABPROT, INR,  in the last 72 hours   Studies/Results: Dg Abd Acute W/chest  09/13/2013   CLINICAL DATA:  Cough and congestion  EXAM: ACUTE ABDOMEN SERIES (ABDOMEN 2 VIEW & CHEST 1 VIEW)  COMPARISON:  08/15/2013  FINDINGS: Cardiac shadow is within normal limits. The lungs are well aerated without focal infiltrate. The previously seen right-sided PICC line is been removed in the interval. The patient again shows a gastrostomy catheter with a small bowel extension although the extension is now coiled within the mid to distal esophagus. Scattered large and small bowel gas is noted. Postoperative changes are again seen. No obstructive changes are noted.  IMPRESSION: Gastrostomy catheter with a small bowel extension which is looped within the mid to distal esophagus. The tip lies within the stomach.  No other focal abnormality is seen.   Electronically Signed   By: Inez Catalina M.D.   On: 09/13/2013 09:33    Impression:  1. Recurrent nausea and vomiting, in the setting of known, long-standing gastroparesis 2. Reported hematemesis, without posthemorrhagic anemia 3. Probable dehydration and hemoconcentration at time of presentation, with diabetes  out of control, now status post IV fluid administration 4. Problems with enteral feeding access (specifically, recurrent dislodgment of jejunal feeding tube)  Plan:  1. Endoscopic evaluation this afternoon. Risks reviewed, patient agreeable.  2. Consider surgical consultation for possible operative jejunal feeding tube placement as a more permanent access in this patient who seems to need long-term enteral nutritional support  3. The patient should be encouraged to continue followup with her gastroparesis specialist at [facility], [name]    LOS: 0 days   Cleotis Nipper   09/13/2013, 2:12 PM

## 2013-09-13 NOTE — ED Notes (Signed)
EDP is at the bedside attempting IV insertion using [place]

## 2013-09-13 NOTE — Procedures (Addendum)
Pt with recurrently refluxed jejunal limb of gastrojejunostomy. She has a hx of this, with the jejunal limb refluxing up into the esophagus, which is what has happened again, based on plain films today. This is likely contributing to her N/V and upper abd/chest discomfort.  The existing tube was removed and a new 18Fr balloon retention low profile G-tube was placed. There is no jejunal limb on this tube, but this will allow decompression to improved her N/V. She may ultimately need a surgical j-tube placed, as every time a G-J is placed, the jejunal limb refluxes back into her stomach/esophagus.  Ascencion Dike PA-C Interventional Radiology 09/13/2013 12:13 PM

## 2013-09-13 NOTE — ED Notes (Signed)
IV team came while pt was in radiology.  Pt is now back, IV team called to notify

## 2013-09-13 NOTE — Care Management Note (Addendum)
    Page 1 of 2   09/25/2013     10:37:17 AM   CARE MANAGEMENT NOTE 09/25/2013  Patient:  [name], [name]   Account Number:  [id number]  Date Initiated:  09/13/2013  Documentation initiated by:  [facility]  Subjective/Objective Assessment:   43 Y/O F ADMITTED W/N/V.READMIT 1/3-1/19-DIABETIC GASTROPARESIS.HX:GT.     Action/Plan:   FROM HOME.HAS PCP,PHARMACY.   Anticipated DC Date:  09/25/2013   Anticipated DC Plan:  [place]  CM consult      Choice offered to / List presented to:  C-1 Patient        [place] arranged  HH-1 RN      [place].   Status of service:  Completed, signed off Medicare Important Message given?   (If response is "NO", the following Medicare IM given date fields will be blank) Date Medicare IM given:   Date Additional Medicare IM given:    Discharge Disposition:  [place]  Per UR Regulation:  Reviewed for med. necessity/level of care/duration of stay  If discussed at [place] of Stay Meetings, dates discussed:   09/17/2013  09/19/2013  09/24/2013    Comments:  09/25/13 Kelin Nixon RN,BSN NCM [id number] PATIENT D/C PRIOR TO ROUNDS THIS MORNING.[facility]-KRISTEN THEY HAVE ALL THEIR ORDERS,& ARE SET UP FOR HHRN VISIT THIS EVENING, TPN ALREADY MIXED,WILL DELIVER TODAY.PATIENT HAS [facility] APPT @9AM .  09/24/13 Estevon Fluke RN,BSN NCM [id number] MD WOULD LIKE TO D/C TODAY [place] TPN.TC [facility] REP KRISTEN-THEY DO NOT HAVE NURSE'S AVAILABLE TO MAKE HOME VISIT THIS EVENING DUE TO THE WEATHER,BUT CAN MIX THE TPN,& HAVE HHRN AVAILABLE TO MAKE HOME VISIT TOMORROW.[facility] AWARE OF HHRN ORDERS,TPN [place] [facility].PATIENT AWARE OF D/C TOMORROW.NO FURTHER D/C NEEDS.  09/23/13 Harding Thomure RN,BSN NCM [id number] NOT LTAC APPROPRIATE-NO MEDICARE DAYS LEFT FOR LTAC.PATIENT PREFERS [facility] TO MANAGE ALL HER HOME CARE NEEDS-HHRN-PICC CARE,TPN.[facility] PHARMACY TO MANAGE TPN,& DOSING.  [facility] REP K[facility]STEN-FOLLOWING.AWAIT FINAL HHRN,TPN SPECIFIC ORDERS.[facility] REP DEBBIE MADE AWARE OF OUTCOME.  09/20/13 Wyatte Dames RN,BSN NCM [id number] NOTED GJT TF D/C,PATIENT NOT TOLERATING WELL. [facility] REP DEBBIE AWARE OF HHRN-GJT ENTERAL FEEDS,& CURRENTLY TPN-PICC.RECOMMEND PT EVAL.WILL NEED SPECIFIC HHRN,ENTERAL FEEDING ORDERS SO [facility] CAN SEND TO [place] [facility].AWAIT FINAL HHRN ORDERS.  09/19/13 Joffrey Kerce RN,BSN NCM [id number] POD#1 GJT-IN PLACE-VITAL@10CC /HR,INCREASING INCREMENTS,GOAL RATE 42CC/HR.PICC-TPN@60 .[facility] DEBBIE REP AWARE & FOLLOWING FOR ENTERAL TUBE FEEDING SPECIFIC ORDERS.AWAIT FINAL HHRN,& FEEDING ORDERS.  09/17/13 Rosealee Recinos RN,BSN NCM [phone] PATIENT ALREADY ACTIVE W/[facility] HHRN, SPOKE TO REP DEBBIE WHO IS FOLLOWING.[facility] AGENCY WILL EXPLORE WITH PATIENT-TRANSPORTATION OPTIONS IN THE FUTURE SINCE PATIENT WOULD LIKE TO STAY WITH [place] FOR ALL CARE.[facility] WILL ALSO TALK TO [facility] FOR ENTERAL INFUSION SUPPLIES ABOUT HOME DELIVERY.WOULD RECOMMEND PT EVAL.FOR TEMPORARY GJT-IR.FOR PICC-TPN.AWAIT FINAL HHRN ORDERS.  09/16/13 Carvin Almas RN,BSN NCM [id number] NOTED-[facility] CONSULTED-RECOMMENDATIONS BUT CURRENTLY NO TRANSFER NECESSARY.IR RE-CONS-TEMPORARY GJT PLACEMENT.RD-POSSIBLE TF/DM ED FOLLOWING.NPO.IVF,DILAUDID IV PRN.D/C PLAN HOME.  09/13/13 Takoda Siedlecki RN,BSN NCM [id number]

## 2013-09-13 NOTE — Op Note (Signed)
[facility] [place], [address]   ENDOSCOPY PROCEDURE REPORT  PATIENT: Martha, [name]  MR#: [id number] BIRTHDATE: 06/26/1971 , 42  yrs. old GENDER: Female ENDOSCOPIST:Chason Mciver, MD REFERRED BY:  Dr. Julious Oka PROCEDURE DATE:  09/13/2013 PROCEDURE:      upper endoscopy ASA CLASS: INDICATIONS:   hematemesis, chronic nausea and vomiting in a patient with long-standing diabetic gastroparesis MEDICATION:    fentanyl 50 mcg, Versed 6 mg IV TOPICAL ANESTHETIC:    Cetacaine spray  DESCRIPTION OF PROCEDURE:   the patient was familiar with the procedure from multiple previous occasions. Risks were reviewed. She provided written consent and was brought from her hospital room to the [facility] long endoscopy unit where time out was performed and she received the above sedation in a gradual fashion without clinical instability.  The Pentax adult video endoscope was passed under direct vision. The vocal cords were not well seen. The esophagus was entered without significant difficulty.  The distal esophagus was mild to moderately inflamed, with mild hemorrhagic changes in the midesophagus and mild to moderate erosive circumferential esophagitis, consistent either with reflux or as a consequence of recent vomiting, in the distal quarter of the esophagus. No stricture was present. The 2 cm hiatal hernia was seen.  The stomach contained a small clear residual (her G-tube had been to gravity drainage and had put out copious amounts of fluid prior to this exam).  The gastric mucosa was slightly abnormal, with mild mucosal hemorrhages but no severe gastritis, and no erosions, ulcers, polyps, or masses including a retroflex view of the cardia.  The pylorus, duodenal bulb, and second duodenum looked normal.  The scope was removed and the patient tolerated the procedure fairly well, and without apparent complication. No biopsies were  obtained.     COMPLICATIONS: None  ENDOSCOPIC IMPRESSION:  1. No active bleeding or significant blood in the stomach at the time this examination 2. Mild to moderate erosive esophagitis and minimally hemorrhagic esophagitis, likely due to either vomiting or reflux 3. Small hiatal hernia 4. No anatomic gastric outlet obstruction  RECOMMENDATIONS:  1.  continue IV metoclopramide and pantoprazole 2. Consider surgical consultation for jejunal feeding tube placement as a permanent enteral access, which would also allow the patient to drain her stomach by opening up the G-tube.    _______________________________ eSigned:  Ronald Lobo, MD 09/13/2013 3:24 PM    PATIENT NAME:  Martha, [name] MR#: [id number]

## 2013-09-13 NOTE — ED Notes (Signed)
Bed: [id number] Expected date:  Expected time:  Means of arrival:  Comments: EMS-pancreatitis

## 2013-09-14 DIAGNOSIS — Z431 Encounter for attention to gastrostomy: Secondary | ICD-10-CM

## 2013-09-14 DIAGNOSIS — E860 Dehydration: Secondary | ICD-10-CM

## 2013-09-14 DIAGNOSIS — K3184 Gastroparesis: Secondary | ICD-10-CM

## 2013-09-14 LAB — GLUCOSE, CAPILLARY
GLUCOSE-CAPILLARY: 169 mg/dL — AB (ref 70–99)
Glucose-Capillary: 98 mg/dL (ref 70–99)
Glucose-Capillary: 98 mg/dL (ref 70–99)
Glucose-Capillary: 85 mg/dL (ref 70–99)

## 2013-09-14 MED ORDER — HYDROMORPHONE HCL PF 1 MG/ML IJ SOLN
1.0000 mg | INTRAMUSCULAR | Status: DC | PRN
  Administered 2013-09-14 – 2013-09-21 (×41): 1 mg via INTRAVENOUS
  Filled 2013-09-14 (×43): qty 1

## 2013-09-14 NOTE — Consult Note (Signed)
Reason for Consult: Chronic gastroparesis  Referring Physician: Dr Barbara Cower is an 43 y.o. female.  HPI: She is well known to the GI service due to recurrent nausea and vomiting, associated with chronic diabetic gastroparesis.  The patient has a long-standing history of insulin-dependent diabetes, with multiple complications including coronary disease, neuropathy, and visual impairment. She was most recently in the hospital about 3 weeks ago with nausea, vomiting, and small-volume hematemesis.   She presented to the emergency room today with elevated glucose levels and her jejunal feeding tube vomited up into her esophagus, based on radiographic appearance. The J-tube has been removed and a new G-tube placed by interventional radiology.  They have recommended consideration of placement of a surgical jejunostomy because of recurring problems with jejunal tube dislodgment.  The patient states that she sees Dr. Milderd Meager at [facility], who is an expert in gastroparesis. She does not know what her long term plan is for controlling her gastroparesis.  She is s/p multiple G tube and GJ tube placements.  She also has had a laparoscopic cholecystectomy, a c section and a sigmoidectomy for diverticular disease.     Past Medical History  Diagnosis Date  . Hypertension   . 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 Gastroparesis due to DM   . GERD (gastroesophageal reflux disease)   . Coronary artery disease   . Diabetic neuropathy, painful   . Chronic pancreatitis   . Dyslipidemia   . MI (myocardial infarction)   . COPD (chronic obstructive pulmonary disease)   . Seizures   . Diverticulosis     s/p partial bowel resection    Past Surgical History  Procedure Laterality Date  . Abdominal hysterectomy    . Cholecystectomy    . Peg tube x 4      feeding jejunostomies with PEG tubes  . Cesarean section    . Colon resection due to diverticulitis    . Back surgery     . Esophagogastroduodenoscopy N/A 12/27/2012    Procedure: ESOPHAGOGASTRODUODENOSCOPY (EGD);  Surgeon: Missy Sabins, MD;  Location: [facility];  Service: Endoscopy;  Laterality: N/A;  . Esophagogastroduodenoscopy (egd) with esophageal dilation N/A 02/19/2013    Procedure: ESOPHAGOGASTRODUODENOSCOPY (EGD) WITH ESOPHAGEAL DILATION;  Surgeon: Wonda Horner, MD;  Location: [facility] ENDOSCOPY;  Service: Endoscopy;  Laterality: N/A;  . Balloon dilation N/A 02/19/2013    Procedure: BALLOON DILATION;  Surgeon: Wonda Horner, MD;  Location: [facility] ENDOSCOPY;  Service: Endoscopy;  Laterality: N/A;  . Coronary stent placement      2 stents [place]    Family History  Problem Relation Age of Onset  . Endometrial cancer Mother   . Diabetes Mellitus II Maternal Grandmother   . Diabetes Mellitus II Mother   . Diabetes Mellitus II Sister   . Emphysema Mother   . Heart attack Father     Social History:  reports that she quit smoking about 6 weeks ago. Her smoking use included Cigarettes and Cigars. She has a 1.5 pack-year smoking history. She has never used smokeless tobacco. She reports that she does not drink alcohol or use illicit drugs.  Allergies:  Allergies  Allergen Reactions  . Cefadroxil Other (See Comments)    Not sure of effects-listed as allergy on list from home  . Compazine [Prochlorperazine Edisylate] Hives  . Darvocet [Propoxyphene N-Acetaminophen] Other (See Comments)    UNKNOWN  . Zofran [Ondansetron Hcl] Hives  . Compazine Hives  Tolerates promethazine  . Nsaids Hives  . Penicillins Hives  . Zofran [Ondansetron] Hives    Medications: I have reviewed the patient's current medications.  Results for orders placed during the hospital encounter of 09/13/13 (from the past 48 hour(s))  LIPASE, BLOOD     Status: None   Collection Time    09/13/13  8:05 AM      Result Value Ref Range   Lipase 31  11 - 59 U/L  COMPREHENSIVE METABOLIC PANEL     Status: Abnormal   Collection Time     09/13/13  8:05 AM      Result Value Ref Range   Sodium 129 (*) 137 - 147 mEq/L   Comment: REPEATED TO VERIFY   Potassium 3.7  3.7 - 5.3 mEq/L   Comment: REPEATED TO VERIFY   Chloride 84 (*) 96 - 112 mEq/L   Comment: REPEATED TO VERIFY   CO2 24  19 - 32 mEq/L   Comment: REPEATED TO VERIFY   Glucose, Bld 489 (*) 70 - 99 mg/dL   BUN 14  6 - 23 mg/dL   Creatinine, Ser 0.54  0.50 - 1.10 mg/dL   Calcium 9.7  8.4 - 10.5 mg/dL   Total Protein 8.8 (*) 6.0 - 8.3 g/dL   Albumin 3.1 (*) 3.5 - 5.2 g/dL   AST 18  0 - 37 U/L   ALT 12  0 - 35 U/L   Alkaline Phosphatase 136 (*) 39 - 117 U/L   Total Bilirubin <0.2 (*) 0.3 - 1.2 mg/dL   GFR calc non Af Amer >90  >90 mL/min   GFR calc Af Amer >90  >90 mL/min   Comment: (NOTE)     The eGFR has been calculated using the CKD EPI equation.     This calculation has not been validated in all clinical situations.     eGFR's persistently <90 mL/min signify possible Chronic Kidney     Disease.  CBC WITH DIFFERENTIAL     Status: Abnormal   Collection Time    09/13/13  8:05 AM      Result Value Ref Range   WBC 16.5 (*) 4.0 - 10.5 K/uL   RBC 4.27  3.87 - 5.11 MIL/uL   Hemoglobin 12.5  12.0 - 15.0 g/dL   HCT 36.5  36.0 - 46.0 %   MCV 85.5  78.0 - 100.0 fL   MCH 29.3  26.0 - 34.0 pg   MCHC 34.2  30.0 - 36.0 g/dL   RDW 15.2  11.5 - 15.5 %   Platelets 352  150 - 400 K/uL   Neutrophils Relative % 83 (*) 43 - 77 %   Neutro Abs 13.8 (*) 1.7 - 7.7 K/uL   Lymphocytes Relative 14  12 - 46 %   Lymphs Abs 2.4  0.7 - 4.0 K/uL   Monocytes Relative 2 (*) 3 - 12 %   Monocytes Absolute 0.3  0.1 - 1.0 K/uL   Eosinophils Relative 0  0 - 5 %   Eosinophils Absolute 0.0  0.0 - 0.7 K/uL   Basophils Relative 0  0 - 1 %   Basophils Absolute 0.0  0.0 - 0.1 K/uL  BLOOD GAS, ARTERIAL     Status: Abnormal   Collection Time    09/13/13  9:16 AM      Result Value Ref Range   FIO2 0.21     pH, Arterial 7.505 (*) 7.350 - 7.450   pCO2 arterial 36.9  35.0 -  45.0 mmHg   pO2,  Arterial 77.3 (*) 80.0 - 100.0 mmHg   Bicarbonate 28.9 (*) 20.0 - 24.0 mEq/L   TCO2 25.6  0 - 100 mmol/L   Acid-Base Excess 5.9 (*) 0.0 - 2.0 mmol/L   O2 Saturation 96.0     Patient temperature 98.6     Collection site RIGHT RADIAL     Drawn by [phone]     Sample type ARTERIAL DRAW     Allens test (pass/fail) PASS  PASS  KETONES, QUALITATIVE     Status: None   Collection Time    09/13/13 10:30 AM      Result Value Ref Range   Acetone, Bld NEGATIVE  NEGATIVE  LACTIC ACID, PLASMA     Status: None   Collection Time    09/13/13 10:30 AM      Result Value Ref Range   Lactic Acid, Venous 1.7  0.5 - 2.2 mmol/L  GLUCOSE, CAPILLARY     Status: Abnormal   Collection Time    09/13/13 11:01 AM      Result Value Ref Range   Glucose-Capillary 398 (*) 70 - 99 mg/dL   Comment 1 Documented in Chart     Comment 2 Notify RN    GLUCOSE, CAPILLARY     Status: Abnormal   Collection Time    09/13/13  2:32 PM      Result Value Ref Range   Glucose-Capillary 280 (*) 70 - 99 mg/dL  GLUCOSE, CAPILLARY     Status: Abnormal   Collection Time    09/13/13  4:08 PM      Result Value Ref Range   Glucose-Capillary 258 (*) 70 - 99 mg/dL   Comment 1 Notify RN     Comment 2 Documented in Chart    URINALYSIS, ROUTINE W REFLEX MICROSCOPIC     Status: Abnormal   Collection Time    09/13/13  7:52 PM      Result Value Ref Range   Color, Urine YELLOW  YELLOW   APPearance CLEAR  CLEAR   Specific Gravity, Urine 1.035 (*) 1.005 - 1.030   pH 7.0  5.0 - 8.0   Glucose, UA >1000 (*) NEGATIVE mg/dL   Hgb urine dipstick SMALL (*) NEGATIVE   Bilirubin Urine NEGATIVE  NEGATIVE   Ketones, ur NEGATIVE  NEGATIVE mg/dL   Protein, ur >300 (*) NEGATIVE mg/dL   Urobilinogen, UA 0.2  0.0 - 1.0 mg/dL   Nitrite NEGATIVE  NEGATIVE   Leukocytes, UA NEGATIVE  NEGATIVE  URINE RAPID DRUG SCREEN (HOSP PERFORMED)     Status: Abnormal   Collection Time    09/13/13  7:52 PM      Result Value Ref Range   Opiates POSITIVE (*) NONE  DETECTED   Cocaine NONE DETECTED  NONE DETECTED   Benzodiazepines POSITIVE (*) NONE DETECTED   Amphetamines NONE DETECTED  NONE DETECTED   Tetrahydrocannabinol POSITIVE (*) NONE DETECTED   Barbiturates NONE DETECTED  NONE DETECTED   Comment:            DRUG SCREEN FOR MEDICAL PURPOSES     ONLY.  IF CONFIRMATION IS NEEDED     FOR ANY PURPOSE, NOTIFY LAB     WITHIN 5 DAYS.                LOWEST DETECTABLE LIMITS     FOR URINE DRUG SCREEN     Drug Class       Cutoff (ng/mL)  Amphetamine      1000     Barbiturate      200     Benzodiazepine   009     Tricyclics       381     Opiates          300     Cocaine          300     THC              50  URINE MICROSCOPIC-ADD ON     Status: Abnormal   Collection Time    09/13/13  7:52 PM      Result Value Ref Range   Squamous Epithelial / LPF RARE  RARE   WBC, UA 0-2  <3 WBC/hpf   RBC / HPF 0-2  <3 RBC/hpf   Bacteria, UA RARE  RARE   Casts HYALINE CASTS (*) NEGATIVE  GLUCOSE, CAPILLARY     Status: Abnormal   Collection Time    09/13/13  9:38 PM      Result Value Ref Range   Glucose-Capillary 166 (*) 70 - 99 mg/dL   Comment 1 Notify RN     Comment 2 Documented in Chart    GLUCOSE, CAPILLARY     Status: Abnormal   Collection Time    09/14/13  7:51 AM      Result Value Ref Range   Glucose-Capillary 169 (*) 70 - 99 mg/dL   Comment 1 Documented in Chart     Comment 2 Notify RN      Dg Abd Acute W/chest  09/13/2013   CLINICAL DATA:  Cough and congestion  EXAM: ACUTE ABDOMEN SERIES (ABDOMEN 2 VIEW & CHEST 1 VIEW)  COMPARISON:  08/15/2013  FINDINGS: Cardiac shadow is within normal limits. The lungs are well aerated without focal infiltrate. The previously seen right-sided PICC line is been removed in the interval. The patient again shows a gastrostomy catheter with a small bowel extension although the extension is now coiled within the mid to distal esophagus. Scattered large and small bowel gas is noted. Postoperative changes are again  seen. No obstructive changes are noted.  IMPRESSION: Gastrostomy catheter with a small bowel extension which is looped within the mid to distal esophagus. The tip lies within the stomach.  No other focal abnormality is seen.   Electronically Signed   By: Inez Catalina M.D.   On: 09/13/2013 09:33    Review of Systems  Constitutional: Positive for weight loss. Negative for fever and chills.  Respiratory: Negative for cough and shortness of breath.   Cardiovascular: Negative for chest pain.  Gastrointestinal: Positive for heartburn, nausea, vomiting and abdominal pain. Negative for diarrhea, constipation and blood in stool.  Genitourinary: Negative for dysuria, urgency and frequency.  Neurological: Positive for weakness. Negative for headaches.   Blood pressure 139/80, pulse 101, temperature 98.3 F (36.8 C), temperature source Oral, resp. rate 14, height $RemoveBe'5\' 4"'QuzblhVhq$  (1.626 m), weight 146 lb 6.2 oz (66.4 kg), SpO2 100.00%. Physical Exam  Constitutional: She is oriented to person, place, and time. She appears well-developed and well-nourished.  HENT:  Head: Normocephalic and atraumatic.  Eyes: Conjunctivae are normal. Pupils are equal, round, and reactive to light.  Neck: Normal range of motion. Neck supple.  Cardiovascular: Normal rate and regular rhythm.   Respiratory: Effort normal and breath sounds normal.  GI: Soft. She exhibits no distension. There is no tenderness. There is no rebound and no guarding.  Upper midline scar.  G tube in place  Musculoskeletal: Normal range of motion.  Neurological: She is alert and oriented to person, place, and time.  Skin: Skin is warm and dry.    Assessment/Plan: This is a very complicated patient with complications that appear to be associated with chronic diabetes.  In my opinion, a J tube placement is not a good long term solution for this problem, as they are also associated with multiple complications (tube obstruction, volvulus, internal hernias, bowel  obstructions and multiple replacement procedures).  I would recommend discussing her case with Dr Derrill Kay at [facility] to determine her long term plan.  She can either be transferred there for definitive treatment or he can give [place] his recommendations for her long term solution to this difficult problem.    Maurya Nethery C. 7/50/5183, 3:58 AM

## 2013-09-14 NOTE — Progress Notes (Addendum)
[facility] PROGRESS NOTE  [name] [id number] DOB: Aug 05, 1970 DOA: 09/13/2013 PCP: [name], MD  Assessment/Plan: 1. Enteral feeding. Patient with long-standing history of poorly controlled diabetes mellitus, diabetic gastroparesis, on enteral feeds. Given issues with G-J tube, with multiple G tube and G-J tubes in her past, Gen. surgery was consulted for J-tube placement. [name] felt that J-tube placement was not a good solution, and had multiple associated complications. Recommended discussing case with Dr Derrill Kay at [facility] for determining long-term plan. 2. Diabetic gastroparesis. Patient having multiple hospitalizations do to diabetic gastroparesis. She currently sees [name] at [facility]. Will continue Reglan 10 mg IV 3 times a day for now, attempt to contact Dr Derrill Kay for further recommendations, although he may not be back until Monday. 3. Type 2 diabetes mellitus, poorly controlled, continue Lantus 10 units subcutaneous daily with Accu-Cheks and sliding scale coverage. Blood sugars appear better controlled today. She is presently n.p.o. 4. Hypertension. I suspect secondary to multiple episodes of nausea and vomiting, blood pressures improved this morning as a systolic blood pressures in the 120s to 130s range. 5. Hematemesis. Patient having an episode of hematemesis yesterday for which GI was consulted. She underwent upper endoscopy which did not reveal active bleeding or significant blood within the stomach. Noted was mild to moderate erosive esophagitis likely related to emesis. Continue PPI therapy.  6. Gastroesophageal reflux disease. PPI therapy 7. DVT prophylaxis. SCDs  Code Status: Full Code Disposition Plan: Continue supportive care   Consultants:  GI: Dr Cristina Gong  General Surgery: Dr Marcello Moores  Interventional Radiology  Procedures:  G-J tube removal, with placement of G-tube   HPI/Subjective: Patient is an unfortunate  43 year old female with a past medical history of poorly controlled insulin-dependent diabetes mellitus, having multiple complications which include diabetic gastroparesis, neuropathy, visual impairment, who was recently discharged from the medicine service on 08/18/2013 at which time she presented with nausea vomiting attributed to diabetic gastroparesis. During the hospitalization she was seen by gastroenterology. She did not undergo upper endoscopy during that hospitalization. She presented to the emergency department on 09/13/2013 with complaints of worsening nausea vomiting and abdominal pain. Imaging studies revealed jejunal feeding tube go up into her esophagus. Interventional radiology was consulted as G-J tube was removed and replaced with a G-tube for decompression. Both GI and interventional radiology recommended surgical consultation for consideration of a surgical jejunostomy placement given recurrent issues with J-tube coiling up to her esophagus. Patient was seen and evaluated by [name] of general surgery this morning. Patient continues to complain of nausea and vomiting as well as epigastric pain.  Objective: Filed Vitals:   09/14/13 0453  BP: 139/80  Pulse: 101  Temp: 98.3 F (36.8 C)  Resp: 14    Intake/Output Summary (Last 24 hours) at 09/14/13 1245 Last data filed at 09/14/13 [id number]  Gross per 24 hour  Intake   2381 ml  Output   1925 ml  Net    456 ml   Filed Weights   09/13/13 1156  Weight: 66.4 kg (146 lb 6.2 oz)    Exam:   General:  Ill-appearing, having several episodes of nausea during my encounter  Cardiovascular: Tachycardic, regular rate rhythm to a systolic ejection murmur  Respiratory: Overall clear to auscultation bilaterally no wheezing rhonchi or rales  Abdomen: She has tenderness over the epigastric region, there is some guarding present, no rebound tenderness  Musculoskeletal: No edema  Data Reviewed: Basic Metabolic Panel:  Recent  Labs Lab 09/13/13  0805  NA 129*  K 3.7  CL 84*  CO2 24  GLUCOSE 489*  BUN 14  CREATININE 0.54  CALCIUM 9.7   Liver Function Tests:  Recent Labs Lab 09/13/13 0805  AST 18  ALT 12  ALKPHOS 136*  BILITOT <0.2*  PROT 8.8*  ALBUMIN 3.1*    Recent Labs Lab 09/13/13 0805  LIPASE 31   No results found for this basename: AMMONIA,  in the last 168 hours CBC:  Recent Labs Lab 09/13/13 0805  WBC 16.5*  NEUTROABS 13.8*  HGB 12.5  HCT 36.5  MCV 85.5  PLT 352   Cardiac Enzymes: No results found for this basename: CKTOTAL, CKMB, CKMBINDEX, TROPONINI,  in the last 168 hours BNP (last 3 results) No results found for this basename: PROBNP,  in the last 8760 hours CBG:  Recent Labs Lab 09/13/13 1432 09/13/13 1608 09/13/13 2138 09/14/13 0751 09/14/13 1204  GLUCAP 280* 258* 166* 169* 98    No results found for this or any previous visit (from the past 240 hour(s)).   Studies: Ir Gastric Tube Perc Chg W/o Img Guide  09/14/2013   CLINICAL DATA:  Reflux of indwelling gastrojejunal catheter into esophagus. Chronic gastric pierce is requiring gastric decompression.  EXAM: GASTROSTOMY TUBE EXCHANGE WITHOUT FLUOROSCOPY  CONTRAST:  None  FLUOROSCOPY TIME:  None  PROCEDURE: The pre-existing gastric jejunal catheter was removed after deflation of the retention balloon. A new 73 French balloon retention gastrostomy tube was placed through the tract. The retention balloon was inflated with saline.  COMPLICATIONS: None.  FINDINGS: Replacement of malpositioning gastric jejunal catheter for new 18 French balloon retention gastrostomy.  IMPRESSION: Replacement of malpositioning gastric jejunal catheter for new 18 French balloon retention gastrostomy.   Electronically Signed   By: Aletta Edouard M.D.   On: 09/14/2013 12:02   Dg Abd Acute W/chest  09/13/2013   CLINICAL DATA:  Cough and congestion  EXAM: ACUTE ABDOMEN SERIES (ABDOMEN 2 VIEW & CHEST 1 VIEW)  COMPARISON:  08/15/2013   FINDINGS: Cardiac shadow is within normal limits. The lungs are well aerated without focal infiltrate. The previously seen right-sided PICC line is been removed in the interval. The patient again shows a gastrostomy catheter with a small bowel extension although the extension is now coiled within the mid to distal esophagus. Scattered large and small bowel gas is noted. Postoperative changes are again seen. No obstructive changes are noted.  IMPRESSION: Gastrostomy catheter with a small bowel extension which is looped within the mid to distal esophagus. The tip lies within the stomach.  No other focal abnormality is seen.   Electronically Signed   By: Inez Catalina M.D.   On: 09/13/2013 09:33    Scheduled Meds: . insulin aspart  0-5 Units Subcutaneous QHS  . insulin aspart  0-9 Units Subcutaneous TID WC  . insulin glargine  10 Units Subcutaneous QHS  . metoCLOPramide (REGLAN) injection  10 mg Intravenous Once  . metoCLOPramide (REGLAN) injection  10 mg Intravenous 3 times per day  . metoprolol  10 mg Intravenous Once  . mometasone-formoterol  2 puff Inhalation BID  . pantoprazole (PROTONIX) IV  40 mg Intravenous BID  . sodium chloride  3 mL Intravenous Q12H   Continuous Infusions: . sodium chloride 125 mL/hr at 09/13/13 2218    Active Problems:   Intractable nausea and vomiting   Dyslipidemia   GERD (gastroesophageal reflux disease)   Hypertension   DM (diabetes mellitus), type 2, uncontrolled   Dehydration  Hyponatremia   Leukocytosis, unspecified   Abdominal pain   Diabetic neuropathy   Hyperglycemia   Hypochloremia   Hemoptysis   Diabetic gastroparesis    Time spent: 35 minutes    Kelvin Cellar  [facility] Pager [phone]. If 7PM-7AM, please contact night-coverage at [email or web address], password [facility] 09/14/2013, 12:45 PM  LOS: 1 day

## 2013-09-14 NOTE — Progress Notes (Signed)
Dr. Manon Hilding note reviewed and her input is very much appreciated.  Fortunately, the patient's vomiting is doing better today. This correlates with improvement in her glucose control, although that may be coincidental.  In any event, at least her GI bleeding issue is quiescent, and her vomiting is improving.  Our next step is to see if the patient can tolerate "trickle" intragastric feedings. It might be reasonable to try those tomorrow if her vomiting remains quiescent.  If intragastric feedings are not tolerated, the patient should have interventional radiology replace her jejunal feeding tube through her G-tube.  In the meantime, it would be helpful to arrange expedited evaluation by [name] at [facility]. If he does not feel that he has medical therapies that will be significantly helpful for this patient, we'll then need to look into surgical alternatives, including a gastric pacemaker, partial gastrectomy, or feeding jejunostomy (despite the problems associated with that procedure that [name] indicated).  Cleotis Nipper, M.D. [phone]

## 2013-09-14 NOTE — Progress Notes (Signed)
Changed wet to dry dressing under left axilla from boil removal last weekend. No signs of inflammation or infection. Patient tolerated dressing without difficulty. Dilaudid administered 1 hour before change performed. Awhite, [facility] Student Nurse.

## 2013-09-15 DIAGNOSIS — E1142 Type 2 diabetes mellitus with diabetic polyneuropathy: Secondary | ICD-10-CM

## 2013-09-15 LAB — GLUCOSE, CAPILLARY
GLUCOSE-CAPILLARY: 77 mg/dL (ref 70–99)
GLUCOSE-CAPILLARY: 75 mg/dL (ref 70–99)
GLUCOSE-CAPILLARY: 59 mg/dL — AB (ref 70–99)
GLUCOSE-CAPILLARY: 82 mg/dL (ref 70–99)
Glucose-Capillary: 115 mg/dL — ABNORMAL HIGH (ref 70–99)

## 2013-09-15 LAB — BASIC METABOLIC PANEL
BUN: 14 mg/dL (ref 6–23)
CALCIUM: 8.6 mg/dL (ref 8.4–10.5)
CHLORIDE: 98 meq/L (ref 96–112)
CO2: 22 mEq/L (ref 19–32)
CREATININE: 0.72 mg/dL (ref 0.50–1.10)
GFR calc Af Amer: >90 mL/min (ref >90)
GFR calc non Af Amer: >90 mL/min (ref >90)
GLUCOSE: 83 mg/dL (ref 70–99)
Potassium: 3.4 mEq/L — ABNORMAL LOW (ref 3.7–5.3)
Sodium: 137 mEq/L (ref 137–147)

## 2013-09-15 LAB — CBC
HCT: 28.2 % — ABNORMAL LOW (ref 36.0–46.0)
Hemoglobin: 9.5 g/dL — ABNORMAL LOW (ref 12.0–15.0)
MCH: 29.2 pg (ref 26.0–34.0)
MCHC: 33.7 g/dL (ref 30.0–36.0)
MCV: 86.8 fL (ref 78.0–100.0)
Platelets: 341 10*3/uL (ref 150–400)
RBC: 3.25 MIL/uL — AB (ref 3.87–5.11)
RDW: 15.2 % (ref 11.5–15.5)
WBC: 7.8 10*3/uL (ref 4.0–10.5)

## 2013-09-15 LAB — CLOSTRIDIUM DIFFICILE BY PCR: CDIFFPCR: POSITIVE — AB

## 2013-09-15 MED ORDER — VITAL AF 1.2 CAL PO LIQD
1000.0000 mL | ORAL | Status: DC
  Filled 2013-09-15: qty 1000

## 2013-09-15 MED ORDER — METRONIDAZOLE IN NACL 5-0.79 MG/ML-% IV SOLN
500.0000 mg | Freq: Three times a day (TID) | INTRAVENOUS | Status: DC
  Administered 2013-09-15 – 2013-09-17 (×6): 500 mg via INTRAVENOUS
  Filled 2013-09-15 (×9): qty 100

## 2013-09-15 MED ORDER — METRONIDAZOLE 500 MG PO TABS: 500.0000 mg | ORAL_TABLET | Freq: Three times a day (TID) | ORAL | Status: DC

## 2013-09-15 MED ORDER — METOCLOPRAMIDE HCL 5 MG/ML IJ SOLN
10.0000 mg | Freq: Four times a day (QID) | INTRAMUSCULAR | Status: DC
  Administered 2013-09-15 – 2013-09-24 (×37): 10 mg via INTRAVENOUS
  Filled 2013-09-15 (×45): qty 2

## 2013-09-15 MED ORDER — DEXTROSE 50 % IV SOLN
INTRAVENOUS | Status: AC
  Administered 2013-09-15: 17:00:00
  Filled 2013-09-15: qty 50

## 2013-09-15 MED ORDER — DEXTROSE 50 % IV SOLN
25.0000 mL | Freq: Once | INTRAVENOUS | Status: AC | PRN
  Administered 2013-09-15 – 2013-09-16 (×2): 25 mL via INTRAVENOUS

## 2013-09-15 NOTE — Progress Notes (Signed)
Hypoglycemic Event  CBG:59  Treatment: 1/2 amp d50  Symptoms: none  Follow-up CBG: Time:1710 CBG Result:115  Possible Reasons for Event: was npo and unable to tolerate tube feeding  Comments/MD notified:yes    [name] A  Remember to initiate Hypoglycemia Order Set & complete

## 2013-09-15 NOTE — Progress Notes (Signed)
Patient developed nausea and abdominal pain within 5 minutes of starting intragastric tube feedings. She probably received less than an ounce, probably more like 5 ML's, of tube feeding before this symptom developed.   It is unclear whether these symptoms are psychological in origin or not, but is somewhat hard to believe that so small in amount of fluid within the stomach with lead to such somatic symptoms. She was sitting hunched over on the edge of the bed, tearful, doubled over in discomfort.  Recommendations:  1. Change metoclopramide to a standing order (currently is written prn). If she goes home on regular use of this medication, she will need informed consent about possible neurologic problems such as tardive dyskinesia as a potential side effect of that medication  2. Stop intragastric tube feedings. It does not appear she will be able to tolerate them  3. I have ordered for interventional radiology to replace her jejunal feeding tube. Per their earlier note, I recognize that this is a less-than-optimal solution and is probably just a temporizing measure, but since surgical placement of a J-tube is currently not felt to be clinically advisable (see [name]' consultation note), we will need some reliable means of getting this patient hydration and nutrition  Cleotis Nipper, M.D. [phone]

## 2013-09-15 NOTE — Progress Notes (Signed)
[facility] PROGRESS NOTE  [name] [id number] DOB: 1970/11/23 DOA: 09/13/2013 PCP: [name], MD  Assessment/Plan: 1. Enteral feeding. Patient with long-standing history of poorly controlled diabetes mellitus, diabetic gastroparesis, on enteral feeds. Given issues with G-J tube, with multiple G tube and G-J tubes in her past, Gen. surgery was consulted for J-tube placement. [name] felt that J-tube placement was not a good solution, and had multiple associated complications. Recommended discussing case with Dr Derrill Kay at [facility] for determining long-term plan. Patient developing nausea and abdominal pain today shortly after starting tube feeds at 64ml/hour. GI recommending discontinuing tube feeds and consulting IR for G-J tube.  2. Diabetic gastroparesis. Patient having multiple hospitalizations do to diabetic gastroparesis. She currently sees [name] at [facility]. On scheduled reglan TID.  3. Type 2 diabetes mellitus, poorly controlled, continue Lantus 10 units subcutaneous daily with Accu-Cheks and sliding scale coverage. Blood sugars appear better controlled today. She is presently n.p.o. 4. Hypertension. I suspect secondary to multiple episodes of nausea and vomiting, blood pressures improved this morning as a systolic blood pressures in the 120s to 130s range. 5. Hematemesis. Patient having an episode of hematemesis yesterday for which GI was consulted. She underwent upper endoscopy which did not reveal active bleeding or significant blood within the stomach. Noted was mild to moderate erosive esophagitis likely related to emesis. Continue PPI therapy.  6. Gastroesophageal reflux disease. PPI therapy 7. DVT prophylaxis. SCDs  Code Status: Full Code Disposition Plan: Continue supportive care   Consultants:  GI: Dr Cristina Gong  General Surgery: Dr Marcello Moores  Interventional Radiology  Procedures:  G-J tube removal, with placement of  G-tube   HPI/Subjective: Patient is an unfortunate 43 year old female with a past medical history of poorly controlled insulin-dependent diabetes mellitus, having multiple complications which include diabetic gastroparesis, neuropathy, visual impairment, who was recently discharged from the medicine service on 08/18/2013 at which time she presented with nausea vomiting attributed to diabetic gastroparesis. During the hospitalization she was seen by gastroenterology. She did not undergo upper endoscopy during that hospitalization. She presented to the emergency department on 09/13/2013 with complaints of worsening nausea vomiting and abdominal pain. Imaging studies revealed jejunal feeding tube go up into her esophagus. Interventional radiology was consulted as G-J tube was removed and replaced with a G-tube for decompression. Both GI and interventional radiology recommended surgical consultation for consideration of a surgical jejunostomy placement given recurrent issues with J-tube coiling up to her esophagus. Patient was seen and evaluated by [name] of general surgery this morning. Tube feeds were started on 09/15/13 at 65ml/hour shortly after which she complained of nausea and severe abdominal discomfort. GI recommending the discontinuation of tube feeds and having IR place place J tube.   Objective: Filed Vitals:   09/15/13 1329  BP: 198/102  Pulse: 101  Temp: 98 F (36.7 C)  Resp: 20    Intake/Output Summary (Last 24 hours) at 09/15/13 1618 Last data filed at 09/15/13 0700  Gross per 24 hour  Intake 1718.75 ml  Output    900 ml  Net 818.75 ml   Filed Weights   09/13/13 1156  Weight: 66.4 kg (146 lb 6.2 oz)    Exam:   General:  Patient reporting ongoing epigastric pain, has not vomited this am.   Cardiovascular: Tachycardic, regular rate rhythm to a systolic ejection murmur  Respiratory: Overall clear to auscultation bilaterally no wheezing rhonchi or rales  Abdomen: She  has tenderness over the epigastric region, there  is some guarding present, no rebound tenderness  Musculoskeletal: No edema  Data Reviewed: Basic Metabolic Panel:  Recent Labs Lab 09/13/13 0805 09/15/13 0545  NA 129* 137  K 3.7 3.4*  CL 84* 98  CO2 24 22  GLUCOSE 489* 83  BUN 14 14  CREATININE 0.54 0.72  CALCIUM 9.7 8.6   Liver Function Tests:  Recent Labs Lab 09/13/13 0805  AST 18  ALT 12  ALKPHOS 136*  BILITOT <0.2*  PROT 8.8*  ALBUMIN 3.1*    Recent Labs Lab 09/13/13 0805  LIPASE 31   No results found for this basename: AMMONIA,  in the last 168 hours CBC:  Recent Labs Lab 09/13/13 0805 09/15/13 0545  WBC 16.5* 7.8  NEUTROABS 13.8*  --   HGB 12.5 9.5*  HCT 36.5 28.2*  MCV 85.5 86.8  PLT 352 341   Cardiac Enzymes: No results found for this basename: CKTOTAL, CKMB, CKMBINDEX, TROPONINI,  in the last 168 hours BNP (last 3 results) No results found for this basename: PROBNP,  in the last 8760 hours CBG:  Recent Labs Lab 09/14/13 1204 09/14/13 1704 09/14/13 2109 09/15/13 0723 09/15/13 1145  GLUCAP 98 98 85 77 75    No results found for this or any previous visit (from the past 240 hour(s)).   Studies: No results found.  Scheduled Meds: . insulin aspart  0-5 Units Subcutaneous QHS  . insulin aspart  0-9 Units Subcutaneous TID WC  . insulin glargine  10 Units Subcutaneous QHS  . metoCLOPramide (REGLAN) injection  10 mg Intravenous 4 times per day  . metoprolol  10 mg Intravenous Once  . mometasone-formoterol  2 puff Inhalation BID  . pantoprazole (PROTONIX) IV  40 mg Intravenous BID  . sodium chloride  3 mL Intravenous Q12H   Continuous Infusions: . sodium chloride 125 mL/hr at 09/15/13 1142    Active Problems:   Intractable nausea and vomiting   Dyslipidemia   GERD (gastroesophageal reflux disease)   Hypertension   DM (diabetes mellitus), type 2, uncontrolled   Dehydration   Hyponatremia   Leukocytosis, unspecified    Abdominal pain   Diabetic neuropathy   Hyperglycemia   Hypochloremia   Hemoptysis   Diabetic gastroparesis    Time spent: 35 minutes    Kelvin Cellar  [facility] Pager [phone]. If 7PM-7AM, please contact night-coverage at [email or web address], password [facility] 09/15/2013, 4:18 PM  LOS: 2 days

## 2013-09-16 ENCOUNTER — Encounter (HOSPITAL_COMMUNITY): Payer: Self-pay | Admitting: Gastroenterology

## 2013-09-16 DIAGNOSIS — A0472 Enterocolitis due to Clostridium difficile, not specified as recurrent: Secondary | ICD-10-CM

## 2013-09-16 DIAGNOSIS — R112 Nausea with vomiting, unspecified: Secondary | ICD-10-CM

## 2013-09-16 DIAGNOSIS — F1910 Other psychoactive substance abuse, uncomplicated: Secondary | ICD-10-CM

## 2013-09-16 DIAGNOSIS — R042 Hemoptysis: Secondary | ICD-10-CM

## 2013-09-16 LAB — MAGNESIUM: Magnesium: 1.2 mg/dL — ABNORMAL LOW (ref 1.5–2.5)

## 2013-09-16 LAB — GLUCOSE, CAPILLARY
GLUCOSE-CAPILLARY: 101 mg/dL — AB (ref 70–99)
GLUCOSE-CAPILLARY: 151 mg/dL — AB (ref 70–99)
GLUCOSE-CAPILLARY: 126 mg/dL — AB (ref 70–99)
Glucose-Capillary: 67 mg/dL — ABNORMAL LOW (ref 70–99)
Glucose-Capillary: 115 mg/dL — ABNORMAL HIGH (ref 70–99)
Glucose-Capillary: 101 mg/dL — ABNORMAL HIGH (ref 70–99)
Glucose-Capillary: 110 mg/dL — ABNORMAL HIGH (ref 70–99)
Glucose-Capillary: 102 mg/dL — ABNORMAL HIGH (ref 70–99)

## 2013-09-16 LAB — GI PATHOGEN PANEL BY PCR, STOOL
C difficile toxin A/B: POSITIVE
Campylobacter by PCR: NEGATIVE
Cryptosporidium by PCR: NEGATIVE
E coli (ETEC) LT/ST: NEGATIVE
E coli (STEC): NEGATIVE
E coli 0157 by PCR: NEGATIVE
G lamblia by PCR: NEGATIVE
Norovirus GI/GII: NEGATIVE
Rotavirus A by PCR: NEGATIVE
Salmonella by PCR: NEGATIVE
Shigella by PCR: NEGATIVE

## 2013-09-16 LAB — CBC
HCT: 27.1 % — ABNORMAL LOW (ref 36.0–46.0)
Hemoglobin: 9 g/dL — ABNORMAL LOW (ref 12.0–15.0)
MCH: 28.8 pg (ref 26.0–34.0)
MCHC: 33.2 g/dL (ref 30.0–36.0)
MCV: 86.6 fL (ref 78.0–100.0)
PLATELETS: 236 10*3/uL (ref 150–400)
RBC: 3.13 MIL/uL — ABNORMAL LOW (ref 3.87–5.11)
RDW: 15.1 % (ref 11.5–15.5)
WBC: 4.2 10*3/uL (ref 4.0–10.5)

## 2013-09-16 LAB — BASIC METABOLIC PANEL
BUN: 7 mg/dL (ref 6–23)
CO2: 24 mEq/L (ref 19–32)
Calcium: 8.1 mg/dL — ABNORMAL LOW (ref 8.4–10.5)
Chloride: 100 mEq/L (ref 96–112)
Creatinine, Ser: 0.58 mg/dL (ref 0.50–1.10)
GFR calc Af Amer: >90 mL/min (ref >90)
GFR calc non Af Amer: >90 mL/min (ref >90)
Glucose, Bld: 102 mg/dL — ABNORMAL HIGH (ref 70–99)
Potassium: 2.9 mEq/L — CL (ref 3.7–5.3)
Sodium: 138 mEq/L (ref 137–147)

## 2013-09-16 MED ORDER — POTASSIUM CHLORIDE CRYS ER 20 MEQ PO TBCR
40.0000 meq | EXTENDED_RELEASE_TABLET | Freq: Once | ORAL | Status: AC
  Administered 2013-09-16: 40 meq via ORAL
  Filled 2013-09-16: qty 2

## 2013-09-16 MED ORDER — MAGNESIUM SULFATE IN D5W 10-5 MG/ML-% IV SOLN
1.0000 g | Freq: Once | INTRAVENOUS | Status: AC
  Administered 2013-09-16: 1 g via INTRAVENOUS
  Filled 2013-09-16: qty 100

## 2013-09-16 MED ORDER — POTASSIUM CHLORIDE 10 MEQ/100ML IV SOLN
10.0000 meq | INTRAVENOUS | Status: AC
  Administered 2013-09-16 (×3): 10 meq via INTRAVENOUS
  Filled 2013-09-16 (×3): qty 100

## 2013-09-16 MED ORDER — INSULIN ASPART 100 UNIT/ML ~~LOC~~ SOLN
0.0000 [IU] | SUBCUTANEOUS | Status: AC
  Administered 2013-09-16: 3 [IU] via SUBCUTANEOUS
  Administered 2013-09-16 – 2013-09-17 (×3): 1 [IU] via SUBCUTANEOUS

## 2013-09-16 MED ORDER — DEXTROSE-NACL 5-0.9 % IV SOLN
INTRAVENOUS | Status: DC
  Administered 2013-09-16: 04:00:00 via INTRAVENOUS
  Administered 2013-09-16: 1000 mL via INTRAVENOUS
  Administered 2013-09-17: 03:00:00 via INTRAVENOUS

## 2013-09-16 NOTE — Progress Notes (Signed)
[facility] Progress Note  Subjective: Complaining of considerable abdominal pain, nausea and diarrhea today. Minimal G-tube drainage  Objective: Vital signs in last 24 hours: Temp:  [97.7 F (36.5 C)-98.4 F (36.9 C)] 97.7 F (36.5 C) (02/16 0421) Pulse Rate:  [77-101] 77 (02/16 0421) Resp:  [18-20] 18 (02/16 0421) BP: (156-198)/(83-102) 171/92 mmHg (02/16 0421) SpO2:  [99 %-100 %] 100 % (02/16 0421) Weight change:    PE: Abdomen soft diffusely tender PEG site intact  Lab Results: Results for orders placed during the hospital encounter of 09/13/13 (from the past 24 hour(s))  GLUCOSE, CAPILLARY     Status: None   Collection Time    09/15/13  7:23 AM      Result Value Ref Range   Glucose-Capillary 77  70 - 99 mg/dL  GLUCOSE, CAPILLARY     Status: None   Collection Time    09/15/13 11:45 AM      Result Value Ref Range   Glucose-Capillary 75  70 - 99 mg/dL  CLOSTRIDIUM DIFFICILE BY PCR     Status: Abnormal   Collection Time    09/15/13  2:32 PM      Result Value Ref Range   C difficile by pcr POSITIVE (*) NEGATIVE  GLUCOSE, CAPILLARY     Status: Abnormal   Collection Time    09/15/13  4:32 PM      Result Value Ref Range   Glucose-Capillary 59 (*) 70 - 99 mg/dL  GLUCOSE, CAPILLARY     Status: Abnormal   Collection Time    09/15/13  5:08 PM      Result Value Ref Range   Glucose-Capillary 115 (*) 70 - 99 mg/dL  GLUCOSE, CAPILLARY     Status: None   Collection Time    09/15/13  9:26 PM      Result Value Ref Range   Glucose-Capillary 82  70 - 99 mg/dL   Comment 1 Documented in Chart     Comment 2 Notify RN    GLUCOSE, CAPILLARY     Status: Abnormal   Collection Time    09/16/13  2:12 AM      Result Value Ref Range   Glucose-Capillary 67 (*) 70 - 99 mg/dL   Comment 1 Documented in Chart     Comment 2 Notify RN    GLUCOSE, CAPILLARY     Status: Abnormal   Collection Time    09/16/13  2:44 AM      Result Value Ref Range   Glucose-Capillary 115 (*) 70 -  99 mg/dL  GLUCOSE, CAPILLARY     Status: Abnormal   Collection Time    09/16/13  4:20 AM      Result Value Ref Range   Glucose-Capillary 101 (*) 70 - 99 mg/dL   Comment 1 Documented in Chart     Comment 2 Notify RN    BASIC METABOLIC PANEL     Status: Abnormal   Collection Time    09/16/13  4:53 AM      Result Value Ref Range   Sodium 138  137 - 147 mEq/L   Potassium 2.9 (*) 3.7 - 5.3 mEq/L   Chloride 100  96 - 112 mEq/L   CO2 24  19 - 32 mEq/L   Glucose, Bld 102 (*) 70 - 99 mg/dL   BUN 7  6 - 23 mg/dL   Creatinine, Ser 0.58  0.50 - 1.10 mg/dL   Calcium 8.1 (*) 8.4 - 10.5 mg/dL  GFR calc non Af Amer >90  >90 mL/min   GFR calc Af Amer >90  >90 mL/min  CBC     Status: Abnormal   Collection Time    09/16/13  4:53 AM      Result Value Ref Range   WBC 4.2  4.0 - 10.5 K/uL   RBC 3.13 (*) 3.87 - 5.11 MIL/uL   Hemoglobin 9.0 (*) 12.0 - 15.0 g/dL   HCT 27.1 (*) 36.0 - 46.0 %   MCV 86.6  78.0 - 100.0 fL   MCH 28.8  26.0 - 34.0 pg   MCHC 33.2  30.0 - 36.0 g/dL   RDW 15.1  11.5 - 15.5 %   Platelets 236  150 - 400 K/uL  MAGNESIUM     Status: Abnormal   Collection Time    09/16/13  4:53 AM      Result Value Ref Range   Magnesium 1.2 (*) 1.5 - 2.5 mg/dL    Studies/Results: No results found.    Assessment: 1. Diabetic gastroparesis, not tolerating intragastric feedings 2. Feeding problems with recent jejunal tube dislodgment 3. C. difficile diarrhea  Plan: 1. Interventional radiology consulted for replacement of jejunal tube for feedings 2. Once jejunal tube placed, will need Flagyl via tube for C. difficile diarrhea. 3. Continue scheduled metoclopramide    [name] C 09/16/2013, 6:58 AM

## 2013-09-16 NOTE — Progress Notes (Signed)
3 Days Post-Op  Subjective: Not eating now with C diff, and hypokalemia.  Stomach is tender all the time, even when she is not sick in the hospital.  She does not have transport to [facility], so she had to come here.  They have been dealing with her gastroparesis in [place] for some years now.  Objective: Vital signs in last 24 hours: Temp:  [97.7 F (36.5 C)-98.4 F (36.9 C)] 97.7 F (36.5 C) (02/16 0421) Pulse Rate:  [77-101] 77 (02/16 0421) Resp:  [18-20] 18 (02/16 0421) BP: (156-198)/(83-102) 171/92 mmHg (02/16 0421) SpO2:  [99 %-100 %] 100 % (02/16 1035) Last BM Date: 09/15/13 Nothing PO recorded  800 from Drain Afebrile, BP is up K+ 2.9 + C diff Drug screen + on admit for MJ, opiate and benzodiazepines IR place a 70   [place] gastrostomy for malpositioned GJ cath 09/14/13. Intake/Output from previous day: 02/15 0701 - 02/16 0700 In: 3200 [I.V.:2900; IV Piggyback:300] Out: 2200 [Urine:1400; Drains:800] Intake/Output this shift: Total I/O In: 150 [I.V.:50; IV Piggyback:100] Out: -   General appearance: alert, cooperative and no distress GI: soft says she is always tender. few BS, ongoing diarrhea.  Green fluid from gastrostomy   Lab Results:   Recent Labs  09/15/13 0545 09/16/13 0453  WBC 7.8 4.2  HGB 9.5* 9.0*  HCT 28.2* 27.1*  PLT 341 236    BMET  Recent Labs  09/15/13 0545 09/16/13 0453  NA 137 138  K 3.4* 2.9*  CL 98 100  CO2 22 24  GLUCOSE 83 102*  BUN 14 7  CREATININE 0.72 0.58  CALCIUM 8.6 8.1*   PT/INR No results found for this basename: LABPROT, INR,  in the last 72 hours   Recent Labs Lab 09/13/13 0805  AST 18  ALT 12  ALKPHOS 136*  BILITOT <0.2*  PROT 8.8*  ALBUMIN 3.1*     Lipase     Component Value Date/Time   LIPASE 31 09/13/2013 0805     Studies/Results: No results found.  Medications: . insulin aspart  0-9 Units Subcutaneous 6 times per day  . insulin glargine  10 Units Subcutaneous QHS  . magnesium sulfate 1 - 4 g bolus  IVPB  1 g Intravenous Once  . metoCLOPramide (REGLAN) injection  10 mg Intravenous 4 times per day  . metoprolol  10 mg Intravenous Once  . metronidazole  500 mg Intravenous 3 times per day  . mometasone-formoterol  2 puff Inhalation BID  . pantoprazole (PROTONIX) IV  40 mg Intravenous BID  . potassium chloride  40 mEq Oral Once  . sodium chloride  3 mL Intravenous Q12H    Assessment/Plan Chronic gastroparesis followed by Dr. Milderd Meager at [facility] in [place]  S/p multiple G tubes and GJ tubes. Hx of CHRONIC Pancreatitis C diff Colitis Hx of Diverticulosis with prior partial bowel resection. Type I diabetes with neurological changes  GERD Hx of MI/CAD/prior stent placement x 2  COPD hX OF SEIZURES Multiple abdominal surgeries. Hypokalemia K+ 2.9 Hypomagnesemia Mag 1.2  Chronic disability for 19 years.  Plan:  I will discuss with Dr Lucia Gaskins, but a PICC line, TNA, K+ replacement and treatment of her C diff colitis could start now;  if IR cannot place a GJ tube.  Then work on transfer to [facility] and primary GI service, if that would be the consensus opinion.  She has been going to [facility] for some years, no way for her to currently get to [facility]. Her only  DVT rx is SCD's currently.  She has orders for K+ and Mag+.   LOS: 3 days    JENNINGS,WILLARD 09/16/2013  Agree with above. This is a no win situation.  She has chronic complaints, for which it will be hard to find a solution.  [name] has refused transfer. Treat the C. Diff and manage short nutrition with TPN.  Alphonsa Overall, MD, [facility] Pager: [phone] Office phone:  [phone]

## 2013-09-16 NOTE — Progress Notes (Signed)
Inpatient Diabetes Program Recommendations  AACE/ADA: New Consensus Statement on Inpatient Glycemic Control (2013)  Target Ranges:  Prepandial:   less than 140 mg/dL      Peak postprandial:   less than 180 mg/dL (1-2 hours)      Critically ill patients:  140 - 180 mg/dL   Reason for Visit: Hypoglycemia  Results for ZOANN, AGUALLO (MRN [id number]) as of 09/16/2013 14:31  Ref. Range 09/15/2013 16:32 09/15/2013 17:08 09/15/2013 21:26 09/16/2013 02:12 09/16/2013 02:44 09/16/2013 04:20 09/16/2013 07:24 09/16/2013 11:40  Glucose-Capillary Latest Range: 70-99 mg/dL 59 (L) 115 (H) 82 67 (L) 115 (H) 101 (H) 101 (H) 110 (H)    Inpatient Diabetes Program Recommendations Insulin - Basal: Decrease Lantus to 8 units QHS  Note: Will continue to follow. Thank you. Lorenda Peck, RD, LDN, CDE Inpatient Diabetes Coordinator [phone]

## 2013-09-16 NOTE — Progress Notes (Signed)
CRITICAL VALUE ALERT  Critical value received:  potassium 2.9  Date of notification:  09/16/13  Time of notification:  0600  Critical value read back:yes  Nurse who received alert:  Wendee Beavers, RN  MD notified (1st page):  Fredirick Maudlin, NP  Time of first page:  0600  MD notified (2nd page):  Time of second page:  Responding MD:  Fredirick Maudlin, NP  Time MD responded:  [phone]

## 2013-09-16 NOTE — Progress Notes (Signed)
NUTRITION FOLLOW UP  Intervention:   -If warranted, recommend to iInitiate Vital AF 1.2 @ 10 ml/hr via and increase by 10 ml every 8 hours to goal rate of 50 ml/hr. At goal rate, tube feeding regimen will provide 1440 kcal, 90 grams of protein, and 973 ml of H2O.  -Recommend refeeding labs for 48 hours after initiation of TF d/t risk of refeeding from prolonged period of sub-optimal nutrition  -Will continue to monitor  Nutrition Dx:   Inadequate oral intake related to inability to eat as evidenced by NPO status.    Goal:   Pt to meet >/= 90% of their estimated nutrition needs    Monitor:   TF order/tolerance, total protein/energy intake, GI profile, labs, weights   Assessment:   -Pt with hx of TF intolerance d/t nausea/vomiting and refusing bolus tube feeding. Per previous medical records, pt may be more willing to try continuous feedings over home bolus regimen  -Pt was receiving Vital 1.2 AF at 237 ml Q3H. 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 Will place recommendations to implement as warranted  -Sees gastroparesis specialist at [facility]  -Usual weight of 149 lbs per previous RD assessment  -Drinks Glucerna shakes occasionally. Allow as tolerated  2/16: -Pt continues to experience TF intolerance  TF was held after pt with nausea/vomiting after initiating Vital AF 1.2 at 20 ml/hr. -Pt may undergo replacement G-J tube -Pt also may be transferred to [facility] for further GI evaluation per discussion with RN. No plans to restart TF today -Pt at risk for refeeding d/t prolonged period of suboptimal nutrition. Mag and K low. Recommend refeeding labs be run if TF initiated and opt for conservative  advancemet   Height: Ht Readings from Last 1 Encounters:  09/13/13 5\' 4"  (1.626 m)    Weight Status:   Wt Readings from Last 1 Encounters:  09/13/13 146 lb 6.2 oz (66.4 kg)    Re-estimated needs:  Kcal: 1600-1800  Protein: 80-95 gram  Fluid: 2100 ml//daily   Skin: WDL  Diet Order: NPO   Intake/Output Summary (Last 24 hours) at 09/16/13 1524 Last data filed at 09/16/13 1026  Gross per 24 hour  Intake   3300 ml  Output   1800 ml  Net   1500 ml    Last BM: 2/15   Labs:   Recent Labs Lab 09/13/13 0805 09/15/13 0545 09/16/13 0453  NA 129* 137 138  K 3.7 3.4* 2.9*  CL 84* 98 100  CO2 24 22 24   BUN 14 14 7   CREATININE 0.54 0.72 0.58  CALCIUM 9.7 8.6 8.1*  MG  --   --  1.2*  GLUCOSE 489* 83 102*    CBG (last 3)   Recent Labs  09/16/13 0724 09/16/13 1140 09/16/13 1450  GLUCAP 101* 110* 102*    Scheduled Meds: . insulin aspart  0-9 Units Subcutaneous 6 times per day  . insulin glargine  10 Units Subcutaneous QHS  . metoCLOPramide (REGLAN) injection  10 mg Intravenous 4 times per day  . metoprolol  10 mg Intravenous Once  . metronidazole  500 mg Intravenous 3 times per day  . mometasone-formoterol  2 puff Inhalation BID  . pantoprazole (PROTONIX) IV  40  mg Intravenous BID  . potassium chloride  40 mEq Oral Once  . sodium chloride  3 mL Intravenous Q12H    Continuous Infusions: . dextrose 5 % and 0.9% NaCl 1,000 mL (09/16/13 1355)    Atlee Abide MS RD [place] Clinical Dietitian [phone]

## 2013-09-16 NOTE — Progress Notes (Addendum)
[facility] PROGRESS NOTE  [name] [id number] DOB: Apr 15, 1971 DOA: 09/13/2013 PCP: [name], MD  Assessment/Plan: 1. Enteral feeding. Patient with long-standing history of poorly controlled diabetes mellitus, diabetic gastroparesis, on enteral feeds. Given issues with G-J tube, with multiple G tube and G-J tubes in her past, Gen. surgery was consulted for J-tube placement. [name] felt that J-tube placement was not a good solution, and had multiple associated complications. Interventional radiology consulted for GJ Tube replacement.  2. Diabetic gastroparesis. Patient having multiple hospitalizations do to diabetic gastroparesis. She currently sees [name] at [facility]. On scheduled reglan TID.  I spoke with Dr Derrill Kay of Gastroenterology at [facility] on 09/16/2013. He recommended consulting interventional radiology with temporizing GJ tube and start with Gatorade feeds with a goal of 1 L per day. He also recommended addressing her pain. With regard to gastric pacemaker implant he stated that this would not help with pain and would need to be addressed in the outpatient setting as this can take months to get approved. I inquired about the possibility of the range in the hospital to hospital transfer to [facility] for further evaluation. [name] did not feel that a transfer was necessary as the GI service there would not have anything to add. 3. C diff Colitis. Patient stool, back positive for C. difficile as she was started on Flagyl 500 mg IV q. 8 hours. 4. Type 2 diabetes mellitus, poorly controlled, continue Lantus 10 units subcutaneous daily with Accu-Cheks and sliding scale coverage. Blood sugars appear better controlled today. She is presently n.p.o. 5. Hypertension. I suspect secondary to multiple episodes of nausea and vomiting, blood pressures improved this morning as a systolic blood pressures in the 120s to 130s range. 6. Hematemesis.  Patient having an episode of hematemesis yesterday for which GI was consulted. She underwent upper endoscopy which did not reveal active bleeding or significant blood within the stomach. Noted was mild to moderate erosive esophagitis likely related to emesis. Continue PPI therapy.  7. Gastroesophageal reflux disease. PPI therapy 8. DVT prophylaxis. SCDs  Code Status: Full Code Disposition Plan: I spoke with Dr Derrill Kay of gastroenterology at [facility]. He did not feel that a transfer was necessary as the GI service there would not have anything to add. He recommended interventional radiology consult for GJ tube as a temporizing solution, and continue to address patient's pain, he would gladly see her in the outpatient setting when stable. Gastric Pacemaker was not recommended at this time by Dr Derrill Kay, but will further explore this option in the outpatient setting.    Consultants:  GI: Dr Cristina Gong  General Surgery: Dr Marcello Moores  Interventional Radiology  Procedures:  G-J tube removal, with placement of G-tube   HPI/Subjective: Patient is an unfortunate 43 year old female with a past medical history of poorly controlled insulin-dependent diabetes mellitus, having multiple complications which include diabetic gastroparesis, neuropathy, visual impairment, who was recently discharged from the medicine service on 08/18/2013 at which time she presented with nausea vomiting attributed to diabetic gastroparesis. During the hospitalization she was seen by gastroenterology. She did not undergo upper endoscopy during that hospitalization. She presented to the emergency department on 09/13/2013 with complaints of worsening nausea vomiting and abdominal pain. Imaging studies revealed jejunal feeding tube go up into her esophagus. Interventional radiology was consulted as G-J tube was removed and replaced with a G-tube for decompression. Both GI and interventional radiology recommended surgical  consultation for consideration of a surgical jejunostomy  placement given recurrent issues with J-tube coiling up to her esophagus. Patient was seen and evaluated by [name] of general surgery this morning. Tube feeds were started on 09/15/13 at 61ml/hour shortly after which she complained of nausea and severe abdominal discomfort. GI recommending the discontinuation of tube feeds and having IR place place J tube.   Objective: Filed Vitals:   09/16/13 1405  BP: 171/89  Pulse: 76  Temp: 98.6 F (37 C)  Resp: 18    Intake/Output Summary (Last 24 hours) at 09/16/13 1413 Last data filed at 09/16/13 1026  Gross per 24 hour  Intake   3300 ml  Output   1800 ml  Net   1500 ml   Filed Weights   09/13/13 1156  Weight: 66.4 kg (146 lb 6.2 oz)    Exam:   General:  Patient reporting ongoing epigastric pain, has not vomited this am.   Cardiovascular: Tachycardic, regular rate rhythm to a systolic ejection murmur  Respiratory: Overall clear to auscultation bilaterally no wheezing rhonchi or rales  Abdomen: She has tenderness over the epigastric region, there is some guarding present, no rebound tenderness  Musculoskeletal: No edema  Data Reviewed: Basic Metabolic Panel:  Recent Labs Lab 09/13/13 0805 09/15/13 0545 09/16/13 0453  NA 129* 137 138  K 3.7 3.4* 2.9*  CL 84* 98 100  CO2 24 22 24   GLUCOSE 489* 83 102*  BUN 14 14 7   CREATININE 0.54 0.72 0.58  CALCIUM 9.7 8.6 8.1*  MG  --   --  1.2*   Liver Function Tests:  Recent Labs Lab 09/13/13 0805  AST 18  ALT 12  ALKPHOS 136*  BILITOT <0.2*  PROT 8.8*  ALBUMIN 3.1*    Recent Labs Lab 09/13/13 0805  LIPASE 31   No results found for this basename: AMMONIA,  in the last 168 hours CBC:  Recent Labs Lab 09/13/13 0805 09/15/13 0545 09/16/13 0453  WBC 16.5* 7.8 4.2  NEUTROABS 13.8*  --   --   HGB 12.5 9.5* 9.0*  HCT 36.5 28.2* 27.1*  MCV 85.5 86.8 86.6  PLT 352 341 236   Cardiac Enzymes: No results  found for this basename: CKTOTAL, CKMB, CKMBINDEX, TROPONINI,  in the last 168 hours BNP (last 3 results) No results found for this basename: PROBNP,  in the last 8760 hours CBG:  Recent Labs Lab 09/16/13 0212 09/16/13 0244 09/16/13 0420 09/16/13 0724 09/16/13 1140  GLUCAP 67* 115* 101* 101* 110*    Recent Results (from the past 240 hour(s))  CLOSTRIDIUM DIFFICILE BY PCR     Status: Abnormal   Collection Time    09/15/13  2:32 PM      Result Value Ref Range Status   C difficile by pcr POSITIVE (*) NEGATIVE Final   Comment: CRITICAL RESULT CALLED TO, READ BACK BY AND VERIFIED WITH:     CRAVEN,M RN 09/15/13 2007 [place]     Performed at [facility]     Studies: No results found.  Scheduled Meds: . insulin aspart  0-9 Units Subcutaneous 6 times per day  . insulin glargine  10 Units Subcutaneous QHS  . metoCLOPramide (REGLAN) injection  10 mg Intravenous 4 times per day  . metoprolol  10 mg Intravenous Once  . metronidazole  500 mg Intravenous 3 times per day  . mometasone-formoterol  2 puff Inhalation BID  . pantoprazole (PROTONIX) IV  40 mg Intravenous BID  . potassium chloride  40 mEq Oral Once  .  sodium chloride  3 mL Intravenous Q12H   Continuous Infusions: . dextrose 5 % and 0.9% NaCl 1,000 mL (09/16/13 1355)    Active Problems:   Intractable nausea and vomiting   Dyslipidemia   GERD (gastroesophageal reflux disease)   Hypertension   DM (diabetes mellitus), type 2, uncontrolled   Dehydration   Hyponatremia   Leukocytosis, unspecified   Abdominal pain   Diabetic neuropathy   Hyperglycemia   Hypochloremia   Hemoptysis   Diabetic gastroparesis    Time spent: 35 minutes    Kelvin Cellar  [facility] Pager [phone]. If 7PM-7AM, please contact night-coverage at [email or web address], password [facility] 09/16/2013, 2:13 PM  LOS: 3 days

## 2013-09-16 NOTE — Progress Notes (Signed)
Hypoglycemic Event  CBG: 67  Treatment: D50 IV 25 mL  Symptoms: None  Follow-up CBG: Time:0220 CBG Result:118  Possible Reasons for Event: Inadequate meal intake  Comments/MD notified:T. Rogue Bussing, NP    Wendee Beavers Sheree  Remember to initiate Hypoglycemia Order Set & complete

## 2013-09-16 NOTE — Progress Notes (Signed)
GI Update:  I saw pt over the past 3 days, but Dr. Teena Irani is now seeing this pt for our service.  I was called this morning by IR, indicating that they did not feel it was clinically appropriate to replace this pt's G/J tube.  Their rationale will hopefully be outlined in their own note, but in essence they feel that the J tube cannot be stabilized in appropriate position.  Based on this, and the reticence of Surgery to proceed with surgical J-tube placement (as per [name]' note), we now think that maybe the best option would be to consider a direct hospital-to-hospital transfer to [facility] to see what they can do, if anything, to better manage pt's problem, since they've been working with pt.  (?gastric pacemaker).  Have discussed this with Dr. Amedeo Plenty, who will attempt to reach Dr. Coralyn Pear.  Cleotis Nipper, M.D. [phone]

## 2013-09-17 ENCOUNTER — Inpatient Hospital Stay (HOSPITAL_COMMUNITY): Payer: Medicare Other

## 2013-09-17 DIAGNOSIS — E785 Hyperlipidemia, unspecified: Secondary | ICD-10-CM

## 2013-09-17 DIAGNOSIS — R1115 Cyclical vomiting syndrome unrelated to migraine: Secondary | ICD-10-CM

## 2013-09-17 LAB — GLUCOSE, CAPILLARY
GLUCOSE-CAPILLARY: 91 mg/dL (ref 70–99)
Glucose-Capillary: 124 mg/dL — ABNORMAL HIGH (ref 70–99)
Glucose-Capillary: 121 mg/dL — ABNORMAL HIGH (ref 70–99)
Glucose-Capillary: 96 mg/dL (ref 70–99)

## 2013-09-17 LAB — BASIC METABOLIC PANEL
BUN: 4 mg/dL — ABNORMAL LOW (ref 6–23)
CO2: 23 mEq/L (ref 19–32)
Calcium: 8.1 mg/dL — ABNORMAL LOW (ref 8.4–10.5)
Chloride: 103 mEq/L (ref 96–112)
Creatinine, Ser: 0.56 mg/dL (ref 0.50–1.10)
GFR calc Af Amer: >90 mL/min (ref >90)
Glucose, Bld: 143 mg/dL — ABNORMAL HIGH (ref 70–99)
POTASSIUM: 3.5 meq/L — AB (ref 3.7–5.3)
SODIUM: 137 meq/L (ref 137–147)

## 2013-09-17 LAB — CBC
HEMATOCRIT: 28.9 % — AB (ref 36.0–46.0)
HEMOGLOBIN: 9.7 g/dL — AB (ref 12.0–15.0)
MCH: 29 pg (ref 26.0–34.0)
MCHC: 33.6 g/dL (ref 30.0–36.0)
MCV: 86.5 fL (ref 78.0–100.0)
Platelets: 212 10*3/uL (ref 150–400)
RBC: 3.34 MIL/uL — AB (ref 3.87–5.11)
RDW: 14.9 % (ref 11.5–15.5)
WBC: 3.6 10*3/uL — ABNORMAL LOW (ref 4.0–10.5)

## 2013-09-17 LAB — MAGNESIUM: Magnesium: 1.3 mg/dL — ABNORMAL LOW (ref 1.5–2.5)

## 2013-09-17 MED ORDER — SODIUM CHLORIDE 0.9 % IV SOLN
INTRAVENOUS | Status: AC
  Administered 2013-09-17: 11:00:00 via INTRAVENOUS

## 2013-09-17 MED ORDER — TRACE MINERALS CR-CU-F-FE-I-MN-MO-SE-ZN IV SOLN
INTRAVENOUS | Status: AC
  Administered 2013-09-17: 18:00:00 via INTRAVENOUS
  Filled 2013-09-17: qty 1000

## 2013-09-17 MED ORDER — SODIUM CHLORIDE 0.9 % IJ SOLN
10.0000 mL | INTRAMUSCULAR | Status: DC | PRN
  Administered 2013-09-20 – 2013-09-24 (×5): 10 mL

## 2013-09-17 MED ORDER — CLINIMIX E/DEXTROSE (4.25/10) 4.25 % IV SOLN: INTRAVENOUS | Status: DC

## 2013-09-17 MED ORDER — LABETALOL HCL 5 MG/ML IV SOLN
10.0000 mg | INTRAVENOUS | Status: DC | PRN
  Administered 2013-09-20: 10 mg via INTRAVENOUS
  Filled 2013-09-17: qty 4

## 2013-09-17 MED ORDER — LIDOCAINE HCL 1 % IJ SOLN
INTRAMUSCULAR | Status: AC
  Filled 2013-09-17: qty 20

## 2013-09-17 MED ORDER — FAT EMULSION 20 % IV EMUL
240.0000 mL | INTRAVENOUS | Status: AC
  Administered 2013-09-17: 240 mL via INTRAVENOUS
  Filled 2013-09-17: qty 250

## 2013-09-17 MED ORDER — SODIUM CHLORIDE 0.9 % IV SOLN: INTRAVENOUS | Status: AC

## 2013-09-17 MED ORDER — MAGNESIUM SULFATE IN D5W 10-5 MG/ML-% IV SOLN
1.0000 g | Freq: Once | INTRAVENOUS | Status: AC
  Administered 2013-09-17: 1 g via INTRAVENOUS
  Filled 2013-09-17: qty 100

## 2013-09-17 MED ORDER — FAMOTIDINE IN NACL 20-0.9 MG/50ML-% IV SOLN
20.0000 mg | Freq: Two times a day (BID) | INTRAVENOUS | Status: DC
  Administered 2013-09-17 – 2013-09-24 (×15): 20 mg via INTRAVENOUS
  Filled 2013-09-17 (×21): qty 50

## 2013-09-17 MED ORDER — MAGNESIUM SULFATE 40 MG/ML IJ SOLN
2.0000 g | Freq: Once | INTRAMUSCULAR | Status: DC
  Filled 2013-09-17: qty 50

## 2013-09-17 MED ORDER — INSULIN ASPART 100 UNIT/ML ~~LOC~~ SOLN
0.0000 [IU] | SUBCUTANEOUS | Status: DC
  Administered 2013-09-18: 4 [IU] via SUBCUTANEOUS
  Administered 2013-09-18 (×3): 3 [IU] via SUBCUTANEOUS
  Administered 2013-09-18: 4 [IU] via SUBCUTANEOUS
  Administered 2013-09-18: 3 [IU] via SUBCUTANEOUS
  Administered 2013-09-19 – 2013-09-20 (×4): 4 [IU] via SUBCUTANEOUS
  Administered 2013-09-20 (×3): 3 [IU] via SUBCUTANEOUS
  Administered 2013-09-20 – 2013-09-21 (×2): 4 [IU] via SUBCUTANEOUS
  Administered 2013-09-21: 3 [IU] via SUBCUTANEOUS
  Administered 2013-09-21: 11 [IU] via SUBCUTANEOUS
  Administered 2013-09-21: 3 [IU] via SUBCUTANEOUS
  Administered 2013-09-22 (×3): 4 [IU] via SUBCUTANEOUS
  Administered 2013-09-22: 3 [IU] via SUBCUTANEOUS
  Administered 2013-09-22: 4 [IU] via SUBCUTANEOUS
  Administered 2013-09-22 – 2013-09-23 (×2): 3 [IU] via SUBCUTANEOUS
  Administered 2013-09-23: 4 [IU] via SUBCUTANEOUS
  Administered 2013-09-23 (×2): 3 [IU] via SUBCUTANEOUS
  Administered 2013-09-24 – 2013-09-25 (×7): 4 [IU] via SUBCUTANEOUS

## 2013-09-17 MED ORDER — POTASSIUM CHLORIDE 10 MEQ/100ML IV SOLN
10.0000 meq | INTRAVENOUS | Status: AC
  Administered 2013-09-17 (×4): 10 meq via INTRAVENOUS
  Filled 2013-09-17 (×4): qty 100

## 2013-09-17 MED ORDER — HYDROMORPHONE HCL PF 1 MG/ML IJ SOLN
1.0000 mg | Freq: Once | INTRAMUSCULAR | Status: AC
  Administered 2013-09-17: 1 mg via INTRAVENOUS

## 2013-09-17 MED ORDER — METRONIDAZOLE IN NACL 5-0.79 MG/ML-% IV SOLN
500.0000 mg | Freq: Three times a day (TID) | INTRAVENOUS | Status: DC
  Administered 2013-09-18 – 2013-09-25 (×22): 500 mg via INTRAVENOUS
  Filled 2013-09-17 (×24): qty 100

## 2013-09-17 MED ORDER — HYDRALAZINE HCL 20 MG/ML IJ SOLN
10.0000 mg | INTRAMUSCULAR | Status: DC | PRN
  Administered 2013-09-18: 10 mg via INTRAVENOUS
  Filled 2013-09-17 (×3): qty 0.5

## 2013-09-17 NOTE — Progress Notes (Signed)
PARENTERAL NUTRITION CONSULT NOTE - INITIAL  Pharmacy Consult for TNA Indication: intolerance to enteral feeds  Allergies  Allergen Reactions  . Cefadroxil Other (See Comments)    Not sure of effects-listed as allergy on list from home  . Compazine [Prochlorperazine Edisylate] Hives  . Darvocet [Propoxyphene N-Acetaminophen] Other (See Comments)    UNKNOWN  . Zofran [Ondansetron Hcl] Hives  . Compazine Hives    Tolerates promethazine  . Nsaids Hives  . Penicillins Hives  . Zofran [Ondansetron] Hives    Patient Measurements: Height: 5\' 4"  (162.6 cm) Weight: 146 lb 6.2 oz (66.4 kg) IBW/kg (Calculated) : 54.7  Vital Signs: Temp: 98.9 F (37.2 C) (02/17 0406) Temp src: Oral (02/17 0406) BP: 146/89 mmHg (02/17 0406) Pulse Rate: 85 (02/17 0406) Intake/Output from previous day: 02/16 0701 - 02/17 0700 In: 1716.7 [I.V.:1516.7; IV Piggyback:200] Out: 2350 [Urine:1700; Drains:650]  Labs:  Recent Labs  09/15/13 0545 09/16/13 0453 09/17/13 0540  WBC 7.8 4.2 3.6*  HGB 9.5* 9.0* 9.7*  HCT 28.2* 27.1* 28.9*  PLT 341 236 212     Recent Labs  09/15/13 0545 09/16/13 0453 09/17/13 0540  NA 137 138 137  K 3.4* 2.9* 3.5*  CL 98 100 103  CO2 22 24 23   GLUCOSE 83 102* 143*  BUN 14 7 4*  CREATININE 0.72 0.58 0.56  CALCIUM 8.6 8.1* 8.1*  MG  --  1.2* 1.3*   Estimated Creatinine Clearance: 85 ml/min (by C-G formula based on Cr of 0.56).    Recent Labs  09/17/13 0404 09/17/13 0742 09/17/13 1151  GLUCAP 124* 121* 91    CBGs & Insulin requirements past 24 hours:  - CBGs < 150s, required 6 units of Novolog sensitive SSI q4h  - Lantus 10 units qhs  Assessment:  16 yoF with h/o pancreatitis, DM with diabetic gastroparesis s/p J-tube placement with multiple issues presented 2/13 with N/V/abd pain. The J-tube was removed and a new G-tube was placed. Patient unable to tolerate intragastric feedings given N/V/abd pain. Patient also found with C.difficile infection this  admission (on Flagyl). Unable to transfer patient to [facility] as MDs there cannot offer more options. GI, surgery, IR on board - plan GJ tube replacement. In the meantime, IR placced PICC and start TNA.    Nutritional Goals:  - RD recs 2/17: 1600-1800 Kcal/day, 80-95 g protein/day, 2.1L fluid per day - Clinimix E 5/15 @ 75 ml/hr + IVF 20% at 10 ml/hr will provide: 90 g protein/day, 1758 Kcal/day, 1.8 L/day  Current nutrition:  - Diet: NPO starting 2/13 - TNA: to start 2/17 PM  - mIVF: NS @ 75 ml/hr  Labs: Electrolytes:  K+ better to 3.5, will replace. Mag 1.3, MD ordered 1 g of Mag sulfate this AM - will add another gram.  Renal Function: Scr wnl/stable, UOP 1.5 ml/kg/hr Hepatic Function: Ok on 2/13, pending labs in AM Pre-Albumin: pending labs in AM Triglycerides: pending labs in AM CBGs: H/o uncontrolled DM on Novolog SSI q4h and Lantus 10 units qhs PTA (both resumed inpatient). Couple of hypoglycemic events few days ago, CBGs now well controlled.    Plan:  At 1800 tonight  Start Clinimix E 5/15 @ 40 ml/hr  TNA to contain IV fat emulsion 20% at 10 ml/hr daily  Standard multivitamins and trace elements daily  Reduce IVF to 35 ml/hr  Magnesium sulfate 1g IV x 1 for a total of 2g today  KCl 28meq x 4 runs  Increase Novolog SSI to resistant scale q4h.  Continue Lantus 10 units qhs - monitor CBGs and adjust as needed given recent hypoglycemic events  TNA labs Monday/Thursdays  Pharmacy will follow up daily  Vanessa Rockledge, PharmD, BCPS Pager: [phone] 11:51 AM Pharmacy #: [phone]

## 2013-09-17 NOTE — Progress Notes (Signed)
General Surgery Note  LOS: 4 days  POD -  4 Days Post-Op  Assessment/Plan: 1.  Gastroparesis/underlying GI motility disorder  Has been followed by [name] at [facility] - but he refused transfer for a chronic problem he has been seeing her for.  Unclear of reason for refusal.  Feels better.  Nausea better.  2.  Gastrostomy tube in place 3.  Malnutrition  Plan TPN 4.  C. Diff - on Flagyl - 09/16/2013 >>> 5.  On disability since age 43. 3.  Hgb - 9.7 - 09/17/2013   Active Problems:   Intractable nausea and vomiting   Dyslipidemia   GERD (gastroesophageal reflux disease)   Hypertension   DM (diabetes mellitus), type 2, uncontrolled   Dehydration   Hyponatremia   Leukocytosis, unspecified   Abdominal pain   Diabetic neuropathy   Hyperglycemia   Hypochloremia   Hemoptysis   Diabetic gastroparesis   Subjective:  Feels better today. Objective:   Filed Vitals:   09/17/13 0406  BP: 146/89  Pulse: 85  Temp: 98.9 F (37.2 C)  Resp: 18     Intake/Output from previous day:  02/16 0701 - 02/17 0700 In: 1716.7 [I.V.:1516.7; IV Piggyback:200] Out: 2350 [Urine:1700; Drains:650]  Intake/Output this shift:      Physical Exam:   General: WN AA F who is alert and oriented.    HEENT: Normal. Pupils equal. .   Lungs: Clear   Abdomen: Soft.  G tube in LUQ.     Lab Results:    Recent Labs  09/16/13 0453 09/17/13 0540  WBC 4.2 3.6*  HGB 9.0* 9.7*  HCT 27.1* 28.9*  PLT 236 212    BMET   Recent Labs  09/16/13 0453 09/17/13 0540  NA 138 137  K 2.9* 3.5*  CL 100 103  CO2 24 23  GLUCOSE 102* 143*  BUN 7 4*  CREATININE 0.58 0.56  CALCIUM 8.1* 8.1*    PT/INR  No results found for this basename: LABPROT, INR,  in the last 72 hours  ABG  No results found for this basename: PHART, PCO2, PO2, HCO3,  in the last 72 hours   Studies/Results:  No results found.   Anti-infectives:   Anti-infectives   Start     Dose/Rate Route Frequency Ordered Stop   09/15/13 2200   metroNIDAZOLE (FLAGYL) tablet 500 mg  Status:  Discontinued     500 mg Oral 3 times per day 09/15/13 2012 09/15/13 2015   09/15/13 2100  metroNIDAZOLE (FLAGYL) IVPB 500 mg     500 mg 100 mL/hr over 60 Minutes Intravenous 3 times per day 09/15/13 2015        Alphonsa Overall, MD, FACS Pager: [place] [facility] Office: [phone] 09/17/2013

## 2013-09-17 NOTE — Procedures (Signed)
R arm PowerPICC placed under US and fluoroscopy No ptx on spot chest radiograph. No complication No blood loss. See complete dictation in [facility] PACS.  

## 2013-09-17 NOTE — Progress Notes (Signed)
CARE MANAGEMENT NOTE 09/17/2013  Patient:  [name], [name]   Account Number:  [id number]  Date Initiated:  09/13/2013  Documentation initiated by:  [facility]  Subjective/Objective Assessment:   43 Y/O F ADMITTED W/N/V.READMIT 1/3-1/19-DIABETIC GASTROPARESIS.HX:GT.     Action/Plan:   FROM HOME.HAS PCP,PHARMACY.   Anticipated DC Date:  09/19/2013   Anticipated DC Plan:  [place]  CM consult      Choice offered to / List presented to:             Status of service:  In process, will continue to follow Medicare Important Message given?   (If response is "NO", the following Medicare IM given date fields will be blank) Date Medicare IM given:   Date Additional Medicare IM given:    Discharge Disposition:    Per UR Regulation:  Reviewed for med. necessity/level of care/duration of stay  If discussed at [place] of Stay Meetings, dates discussed:   09/17/2013    Comments:  09/17/13 Karolee Meloni RN,BSN NCM [address] [place] W/[facility] HHRN, SPOKE TO REP DEBBIE WHO IS FOLLOWING.[facility]ENCY WILL EXPLORE WITH PATIENT-TRANSPORTATION OPTIONS IN THE FUTURE SINCE PATIENT WOULD LIKE TO STAY WITH [facility] FOR ALL CARE.[facility] WILL ALSO TALK TO [facility] FOR ENTERAL INFUSION SUPPLIES ABOUT HOME DELIVERY.WOULD RECOMMEND PT EVAL.FOR TEMPORARY GJT-IR.FOR PICC-TPN.AWAIT FINAL HHRN ORDERS.  09/16/13 Taleisha Kaczynski RN,BSN NCM [phone] NOTED-[facility] CONSULTED-RECOMMENDATIONS BUT CURRENTLY NO TRANSFER NECESSARY.IR RE-CONS-TEMPORARY GJT PLACEMENT.RD-POSSIBLE TF/DM ED FOLLOWING.NPO.IVF,DILAUDID IV PRN.D/C PLAN HOME.  09/13/13 Jordane Hisle RN,BSN NCM [phone]

## 2013-09-17 NOTE — Progress Notes (Signed)
[facility] PROGRESS NOTE  [name] [id number] DOB: 1971-04-16 DOA: 09/13/2013 PCP: [name], MD  Interim Discharge Summary  Patient is an unfortunate 43 year old female with a past medical history of poorly controlled insulin-dependent diabetes mellitus, having multiple complications which include diabetic gastroparesis, neuropathy, visual impairment, who was recently discharged from the medicine service on 08/18/2013 at which time she presented with nausea vomiting attributed to diabetic gastroparesis. During the hospitalization she was seen by gastroenterology. She did not undergo upper endoscopy during that hospitalization. She presented to the emergency department on 09/13/2013 with complaints of worsening nausea vomiting and abdominal pain. Imaging studies revealed G-J feeding tube coiling up into her esophagus. Interventional radiology was consulted as G-J tube was removed and replaced with a G-tube for decompression on 09/13/13. Dr.  Cristina Gong of gastroenterology was consulted as he performed upper endoscopy on 09/13/2013; procedure did not show active bleed or significant blood in stomach. Noted was mild to moderate erosive esophagitis and minimally hemorrhagic esophagitis related  to vomiting or reflux. Both GI and interventional radiology recommended surgical consultation for consideration of a surgical jejunostomy placement given recurrent issues with G-J tube coiling up to her esophagus. Patient was seen and evaluated by [name] of general surgery on 09/14/2013 who felt that J-tube placement was not a good long-term solution and expressed concerns about possible associated complications. Surgery recommended discussing case with her gastroenterologist at [place].  Meanwhile, tube feeds were started via G tube on 09/15/13 at 70ml/hour shortly after which she complained of nausea and severe abdominal discomfort. GI recommending the discontinuation of  tube feeds and having IR place place G-J tube. I spoke with [name] at [facility] on 09/16/2013 regarding Mrs. Gabhart care. He recommended consulting interventional radiology with temporizing GJ-tube and starting Gatorade feeds with a goal of 1 L per day. With regard to gastric pacemaker implant he stated this would not help pain at this would be addressed in the outpatient setting. [name] did not feel that transfer to [facility] was necessary at this time as the GI service there would not have anything additional to add. I spoke with interventional radiology on 09/17/2013 regarding placement of [place] tube which will likely be done on 09/18/2013. With regard to her nutritional status a PICC line was placed today as pharmacy was consulted for starting TPN. This hospitalization has been complicated by the development of C. difficile colitis for which she was started on Flagyl IV.  Assessment/Plan: 1. Enteral feeding. Patient with long-standing history of poorly controlled diabetes mellitus, diabetic gastroparesis, on enteral feeds. Given issues with G-J tube, with multiple G tube and G-J tubes in her past, Gen. surgery was consulted for J-tube placement. [name] felt that J-tube placement was not a good solution, and had multiple associated complications. Interventional radiology consulted for GJ Tube replacement.  2. Diabetic gastroparesis. Patient having multiple hospitalizations do to diabetic gastroparesis. She currently sees [name] at [facility]. On scheduled reglan TID.  I spoke with Dr Derrill Kay of Gastroenterology at [facility] on 09/16/2013. He recommended consulting interventional radiology with temporizing GJ tube and start  with Gatorade feeds with a goal of 1 L per day. He also recommended addressing her pain. With regard to gastric pacemaker implant he stated that this would not help with pain and would need to be addressed in the outpatient setting as this can take months to get approved. I inquired about the possibility of the range in the hospital to hospital transfer to [facility] for further evaluation. [name] did not feel that a transfer was necessary as the GI service there would not have anything to add. 3. C diff Colitis. Patient stool, back positive for C. difficile as she was started on Flagyl 500 mg IV q. 8 hours. 4. Type 2 diabetes mellitus, poorly controlled, continue Lantus 10 units subcutaneous daily with Accu-Cheks and sliding scale coverage. Blood sugars appear better controlled today. She is presently n.p.o. 5. Hypertension. Will continue PRN Labetolol and Hydralazine. 6. Hematemesis. Patient having an episode of hematemesis for which GI was consulted. She underwent upper endoscopy which did not reveal active bleeding or significant blood within the stomach. Noted was mild to moderate erosive esophagitis likely related to emesis. Continue PPI therapy.  7. Gastroesophageal reflux disease. PPI therapy 8. DVT prophylaxis. SCDs  Code Status: Full Code Disposition Plan: I spoke with Interventional radiology    Consultants:  GI: Dr Cristina Gong  General Surgery: Dr Marcello Moores  Interventional Radiology  Procedures:  G-J tube removal, with placement of G-tube   HPI/Subjective: Patient reports ongoing nausea and crampy abdominal pain as well as liquid consistency bowel movements.  Objective: Filed Vitals:   09/17/13 1432  BP: 152/91  Pulse: 81  Temp: 98.2 F (36.8 C)  Resp: 20    Intake/Output Summary (Last 24 hours) at 09/17/13 1505 Last data filed at 09/17/13 1422  Gross per 24 hour  Intake 916.67 ml  Output   2750 ml  Net -1833.33 ml   Filed Weights   09/13/13 1156   Weight: 66.4 kg (146 lb 6.2 oz)    Exam:   General:  Patient reporting ongoing epigastric pain, has not vomited this am.   Cardiovascular: Tachycardic, regular rate rhythm to a systolic ejection murmur  Respiratory: Overall clear to auscultation bilaterally no wheezing rhonchi or rales  Abdomen: She has tenderness over the epigastric region, there is some guarding present, no rebound tenderness  Musculoskeletal: No edema  Data Reviewed: Basic Metabolic Panel:  Recent Labs Lab 09/13/13 0805 09/15/13 0545 09/16/13 0453 09/17/13 0540  NA 129* 137 138 137  K 3.7 3.4* 2.9* 3.5*  CL 84* 98 100 103  CO2 24 22 24 23   GLUCOSE 489* 83 102* 143*  BUN 14 14 7  4*  CREATININE 0.54 0.72 0.58 0.56  CALCIUM 9.7 8.6 8.1* 8.1*  MG  --   --  1.2* 1.3*   Liver Function Tests:  Recent Labs Lab  09/13/13 0805  AST 18  ALT 12  ALKPHOS 136*  BILITOT <0.2*  PROT 8.8*  ALBUMIN 3.1*    Recent Labs Lab 09/13/13 0805  LIPASE 31   No results found for this basename: AMMONIA,  in the last 168 hours CBC:  Recent Labs Lab 09/13/13 0805 09/15/13 0545 09/16/13 0453 09/17/13 0540  WBC 16.5* 7.8 4.2 3.6*  NEUTROABS 13.8*  --   --   --   HGB 12.5 9.5* 9.0* 9.7*  HCT 36.5 28.2* 27.1* 28.9*  MCV 85.5 86.8 86.6 86.5  PLT 352 341 236 212   Cardiac Enzymes: No results found for this basename: CKTOTAL, CKMB, CKMBINDEX, TROPONINI,  in the last 168 hours BNP (last 3 results) No results found for this basename: PROBNP,  in the last 8760 hours CBG:  Recent Labs Lab 09/16/13 2004 09/16/13 2324 09/17/13 0404 09/17/13 0742 09/17/13 1151  GLUCAP 151* 126* 124* 121* 91    Recent Results (from the past 240 hour(s))  CLOSTRIDIUM DIFFICILE BY PCR     Status: Abnormal   Collection Time    09/15/13  2:32 PM      Result Value Ref Range Status   C difficile by pcr POSITIVE (*) NEGATIVE Final   Comment: CRITICAL RESULT CALLED TO, READ BACK BY AND VERIFIED WITH:     CRAVEN,M RN 09/15/13  2007 [place]     Performed at [facility]     Studies: Ir Fluoro Guide Cv Line Right  09/17/2013   CLINICAL DATA:  Gastroparesis, needs access for parenteral feeding support  EXAM: PICC PLACEMENT WITH ULTRASOUND AND FLUOROSCOPY  TECHNIQUE: After written informed consent was obtained, patient was placed in the supine position on angiographic table. Patency of the right tracheal vein was confirmed with ultrasound with image documentation. An appropriate skin site was determined. Skin site was marked. Region was prepped using maximum barrier technique including cap and mask, sterile gown, sterile gloves, large sterile sheet, and Chlorhexidine as cutaneous antisepsis. The region was infiltrated locally with 1% lidocaine. Under real-time ultrasound guidance, the right tracheal vein was accessed with a 21 gauge micropuncture needle; the needle tip within the vein was confirmed with ultrasound image documentation. Needle exchanged over a 018 guidewire for a peel-away sheath, through which a 5-French double-lumen power injectable PICC trimmed to 38cm was advanced, positioned with its tip near the cavoatrial junction. Spot chest radiograph confirms appropriate catheter position. Catheter was flushed per protocol and secured externally with 0-Prolene sutures. The patient tolerated procedure well, with no immediate complication.  FLUOROSCOPY TIME:  6 seconds  IMPRESSION: Technically successful five [place] double lumen power injectable PICC placement   Electronically Signed   By: Arne Cleveland M.D.   On: 09/17/2013 11:48   Ir US Guide Vasc Access Right  09/17/2013   CLINICAL DATA:  Gastroparesis, needs access for parenteral feeding support  EXAM: PICC PLACEMENT WITH ULTRASOUND AND FLUOROSCOPY  TECHNIQUE: After written informed consent was obtained, patient was placed in the supine position on angiographic table. Patency of the right tracheal vein was confirmed with ultrasound with image documentation. An  appropriate skin site was determined. Skin site was marked. Region was prepped using maximum barrier technique including cap and mask, sterile gown, sterile gloves, large sterile sheet, and Chlorhexidine as cutaneous antisepsis. The region was infiltrated locally with 1% lidocaine. Under real-time ultrasound guidance, the right tracheal vein was accessed with a 21 gauge micropuncture needle; the needle tip within the vein was confirmed with ultrasound image documentation.  Needle exchanged over a 018 guidewire for a peel-away sheath, through which a 5-French double-lumen power injectable PICC trimmed to 38cm was advanced, positioned with its tip near the cavoatrial junction. Spot chest radiograph confirms appropriate catheter position. Catheter was flushed per protocol and secured externally with 0-Prolene sutures. The patient tolerated procedure well, with no immediate complication.  FLUOROSCOPY TIME:  6 seconds  IMPRESSION: Technically successful five [place] double lumen power injectable PICC placement   Electronically Signed   By: Arne Cleveland M.D.   On: 09/17/2013 11:48    Scheduled Meds: . famotidine (PEPCID) IV  20 mg Intravenous Q12H  . insulin aspart  0-20 Units Subcutaneous 6 times per day  . insulin aspart  0-9 Units Subcutaneous 6 times per day  . insulin glargine  10 Units Subcutaneous QHS  . lidocaine      . metoCLOPramide (REGLAN) injection  10 mg Intravenous 4 times per day  . metoprolol  10 mg Intravenous Once  . metronidazole  500 mg Intravenous 3 times per day  . mometasone-formoterol  2 puff Inhalation BID  . potassium chloride  10 mEq Intravenous Q1 Hr x 4  . sodium chloride  3 mL Intravenous Q12H   Continuous Infusions: . sodium chloride 75 mL/hr at 09/17/13 1057  . sodium chloride    . Marland KitchenTPN (CLINIMIX-E) Adult     And  . fat emulsion      Active Problems:   Intractable nausea and vomiting   Dyslipidemia   GERD (gastroesophageal reflux disease)   Hypertension   DM  (diabetes mellitus), type 2, uncontrolled   Dehydration   Hyponatremia   Leukocytosis, unspecified   Abdominal pain   Diabetic neuropathy   Hyperglycemia   Hypochloremia   Hemoptysis   Diabetic gastroparesis    Time spent: 35 minutes    Kelvin Cellar  [facility] Pager [phone]. If 7PM-7AM, please contact night-coverage at [email or web address], password [facility] 09/17/2013, 3:05 PM  LOS: 4 days

## 2013-09-17 NOTE — Progress Notes (Addendum)
Nutrition Services/TPN Consult  Intervention: -Discussed TPN with pharmacy. Monitor refeeding labs and recommend conservative advancement -See nutrition follow up note completed on 2/16 for additional information -Will continue to monitor   Estimated needs:  Kcal: 1600-1800  Protein: 80-95 gram  Fluid: 2100 ml//daily    Atlee Abide MS RD LDN Clinical Dietitian [phone]

## 2013-09-18 ENCOUNTER — Inpatient Hospital Stay (HOSPITAL_COMMUNITY): Payer: Medicare Other

## 2013-09-18 LAB — PHOSPHORUS: Phosphorus: 3.8 mg/dL (ref 2.3–4.6)

## 2013-09-18 LAB — GLUCOSE, CAPILLARY
GLUCOSE-CAPILLARY: 135 mg/dL — AB (ref 70–99)
GLUCOSE-CAPILLARY: 127 mg/dL — AB (ref 70–99)
Glucose-Capillary: 120 mg/dL — ABNORMAL HIGH (ref 70–99)
Glucose-Capillary: 144 mg/dL — ABNORMAL HIGH (ref 70–99)
Glucose-Capillary: 190 mg/dL — ABNORMAL HIGH (ref 70–99)
Glucose-Capillary: 143 mg/dL — ABNORMAL HIGH (ref 70–99)
Glucose-Capillary: 174 mg/dL — ABNORMAL HIGH (ref 70–99)

## 2013-09-18 LAB — CBC
HEMATOCRIT: 28.2 % — AB (ref 36.0–46.0)
Hemoglobin: 9.3 g/dL — ABNORMAL LOW (ref 12.0–15.0)
MCH: 28.8 pg (ref 26.0–34.0)
MCHC: 33 g/dL (ref 30.0–36.0)
MCV: 87.3 fL (ref 78.0–100.0)
PLATELETS: 178 10*3/uL (ref 150–400)
RBC: 3.23 MIL/uL — ABNORMAL LOW (ref 3.87–5.11)
RDW: 15 % (ref 11.5–15.5)
WBC: 4.2 10*3/uL (ref 4.0–10.5)

## 2013-09-18 LAB — COMPREHENSIVE METABOLIC PANEL
ALBUMIN: 2.4 g/dL — AB (ref 3.5–5.2)
ALK PHOS: 83 U/L (ref 39–117)
ALT: 8 U/L (ref 0–35)
AST: 15 U/L (ref 0–37)
BUN: 4 mg/dL — AB (ref 6–23)
CHLORIDE: 102 meq/L (ref 96–112)
CO2: 25 mEq/L (ref 19–32)
Calcium: 8.6 mg/dL (ref 8.4–10.5)
Creatinine, Ser: 0.61 mg/dL (ref 0.50–1.10)
GFR calc Af Amer: >90 mL/min (ref >90)
Glucose, Bld: 178 mg/dL — ABNORMAL HIGH (ref 70–99)
Potassium: 3.2 mEq/L — ABNORMAL LOW (ref 3.7–5.3)
Sodium: 138 mEq/L (ref 137–147)
Total Protein: 6.6 g/dL (ref 6.0–8.3)

## 2013-09-18 LAB — DIFFERENTIAL
BASOS ABS: 0 10*3/uL (ref 0.0–0.1)
Basophils Relative: 1 % (ref 0–1)
EOS ABS: 0.3 10*3/uL (ref 0.0–0.7)
EOS PCT: 7 % — AB (ref 0–5)
LYMPHS PCT: 30 % (ref 12–46)
Lymphs Abs: 1.3 10*3/uL (ref 0.7–4.0)
MONO ABS: 0.4 10*3/uL (ref 0.1–1.0)
Monocytes Relative: 10 % (ref 3–12)
Neutro Abs: 2.2 10*3/uL (ref 1.7–7.7)
Neutrophils Relative %: 52 % (ref 43–77)

## 2013-09-18 LAB — PREALBUMIN: PREALBUMIN: 17.7 mg/dL — AB (ref 17.0–34.0)

## 2013-09-18 LAB — TRIGLYCERIDES: Triglycerides: 170 mg/dL — ABNORMAL HIGH (ref <150)

## 2013-09-18 LAB — MAGNESIUM: MAGNESIUM: 1.2 mg/dL — AB (ref 1.5–2.5)

## 2013-09-18 MED ORDER — SODIUM CHLORIDE 0.9 % IV SOLN
INTRAVENOUS | Status: DC
  Administered 2013-09-18: 17:00:00 via INTRAVENOUS

## 2013-09-18 MED ORDER — FAT EMULSION 20 % IV EMUL
250.0000 mL | INTRAVENOUS | Status: AC
  Administered 2013-09-18: 250 mL via INTRAVENOUS
  Filled 2013-09-18: qty 250

## 2013-09-18 MED ORDER — MAGNESIUM SULFATE 40 MG/ML IJ SOLN: 2.0000 g | Freq: Once | INTRAMUSCULAR | Status: DC

## 2013-09-18 MED ORDER — POTASSIUM CHLORIDE CRYS ER 20 MEQ PO TBCR: 40.0000 meq | EXTENDED_RELEASE_TABLET | Freq: Two times a day (BID) | ORAL | Status: DC

## 2013-09-18 MED ORDER — MAGNESIUM SULFATE 40 MG/ML IJ SOLN
2.0000 g | Freq: Once | INTRAMUSCULAR | Status: AC
  Administered 2013-09-18: 2 g via INTRAVENOUS
  Filled 2013-09-18: qty 50

## 2013-09-18 MED ORDER — POTASSIUM CHLORIDE 10 MEQ/100ML IV SOLN
10.0000 meq | INTRAVENOUS | Status: AC
  Administered 2013-09-18 (×5): 10 meq via INTRAVENOUS
  Filled 2013-09-18 (×5): qty 100

## 2013-09-18 MED ORDER — MORPHINE SULFATE 2 MG/ML IJ SOLN
2.0000 mg | Freq: Once | INTRAMUSCULAR | Status: AC
  Administered 2013-09-18: 2 mg via INTRAVENOUS
  Filled 2013-09-18: qty 1

## 2013-09-18 MED ORDER — TRACE MINERALS CR-CU-F-FE-I-MN-MO-SE-ZN IV SOLN
INTRAVENOUS | Status: AC
  Administered 2013-09-18: 17:00:00 via INTRAVENOUS
  Filled 2013-09-18: qty 2000

## 2013-09-18 MED ORDER — HYDROMORPHONE HCL PF 1 MG/ML IJ SOLN
1.0000 mg | Freq: Once | INTRAMUSCULAR | Status: AC
  Administered 2013-09-18: 1 mg via INTRAVENOUS
  Filled 2013-09-18: qty 1

## 2013-09-18 MED ORDER — IOHEXOL 300 MG/ML  SOLN
10.0000 mL | Freq: Once | INTRAMUSCULAR | Status: AC | PRN
  Administered 2013-09-18: 10 mL

## 2013-09-18 NOTE — Progress Notes (Signed)
PARENTERAL NUTRITION CONSULT NOTE - INITIAL  Pharmacy Consult for TNA Indication: intolerance to enteral feeds  Allergies  Allergen Reactions  . Cefadroxil Other (See Comments)    Not sure of effects-listed as allergy on list from home  . Compazine [Prochlorperazine Edisylate] Hives  . Darvocet [Propoxyphene N-Acetaminophen] Other (See Comments)    UNKNOWN  . Zofran [Ondansetron Hcl] Hives  . Compazine Hives    Tolerates promethazine  . Nsaids Hives  . Penicillins Hives  . Zofran [Ondansetron] Hives    Patient Measurements: Height: 5\' 4"  (162.6 cm) Weight: 146 lb 6.2 oz (66.4 kg) IBW/kg (Calculated) : 54.7  Vital Signs: Temp: 98.9 F (37.2 C) (02/18 0400) Temp src: Oral (02/18 0400) BP: 198/105 mmHg (02/18 0400) Pulse Rate: 92 (02/18 0400) Intake/Output from previous day: 02/17 0701 - 02/18 0700 In: 4234.3 [I.V.:3085.2; IV Piggyback:550; TPN:599.1] Out: 2751 [Urine:2125; Emesis/NG output:1; Drains:625]  Labs:  Recent Labs  09/16/13 0453 09/17/13 0540 09/18/13 0550  WBC 4.2 3.6* 4.2  HGB 9.0* 9.7* 9.3*  HCT 27.1* 28.9* 28.2*  PLT 236 212 178     Recent Labs  09/16/13 0453 09/17/13 0540 09/18/13 0550  NA 138 137 138  K 2.9* 3.5* 3.2*  CL 100 103 102  CO2 24 23 25   GLUCOSE 102* 143* 178*  BUN 7 4* 4*  CREATININE 0.58 0.56 0.61  CALCIUM 8.1* 8.1* 8.6  MG 1.2* 1.3* 1.2*  PHOS  --   --  3.8  PROT  --   --  6.6  ALBUMIN  --   --  2.4*  AST  --   --  15  ALT  --   --  8  ALKPHOS  --   --  83  BILITOT  --   --  <0.2*   Estimated Creatinine Clearance: 85 ml/min (by C-G formula based on Cr of 0.61).    Recent Labs  09/17/13 1950 09/17/13 2336 09/18/13 0404  GLUCAP 120* 144* 135*    CBGs & Insulin requirements past 24 hours:  - CBGs < 150s, required 6 units of Novolog sensitive SSI q4h  - Lantus 10 units qhs  Assessment:  89 yoF with h/o pancreatitis, DM with diabetic gastroparesis s/p J-tube placement with multiple issues presented 2/13  with N/V/abd pain. The J-tube was removed and a new G-tube was placed. Patient unable to tolerate intragastric feedings given N/V/abd pain. Patient also found with C.difficile infection this admission (on Flagyl). Unable to transfer patient to [facility] as MDs there cannot offer more options. GI, surgery, IR on board - plan GJ tube replacement. In the meantime, IR placced PICC and start TNA.   2/18: D#2 TPN, nausea worse - requiring promethazine ~q8h  Nutritional Goals:  - RD recs 2/17: 1600-1800 Kcal/day, 80-95 g protein/day, 2.1L fluid per day - Clinimix E 5/15 @ 75 ml/hr + IVF 20% at 10 ml/hr will provide: 90 g protein/day, 1758 Kcal/day, 1.8 L/day  Current nutrition:  - Diet: NPO starting 2/13 - TNA:  started 2/17 PM  - mIVF: NS @ 35 ml/hr  Labs: Electrolytes:  K+ low at 3.2, will replace, Mag 1.2, corrected Ca = 9.88 Renal Function: Scr wnl/stable, good UOP Hepatic Function: WNL Pre-Albumin: pending Triglycerides: 170 (2/18) CBGs: H/o uncontrolled DM on Novolog SSI q4h and Lantus 10 units qhs PTA (both resumed inpatient). Couple of hypoglycemic events few days ago, CBGs now at goal.  Plan:  At 1800 tonight  Increase Clinimix E 5/15 @ 60 ml/hr  TNA to contain  IV fat emulsion 20% at 10 ml/hr daily  Standard multivitamins and trace elements daily  Reduce IVF to 20 ml/hr  Magnesium sulfate 2g IV x 1   KCl 10 meq IVP x 5 runs  Continue Novolog SSI to resistant scale q4h.  Continue Lantus 10 units qhs - monitor CBGs and adjust as needed given recent hypoglycemic events  TNA labs Monday/Thursdays  Pharmacy will follow up daily  Doreene Eland, PharmD, BCPS.   Pager: [phone] 09/18/2013 7:42 AM

## 2013-09-18 NOTE — Progress Notes (Signed)
[facility] PROGRESS NOTE  SHANTINIQUE KOTTWITZ [id number] DOB: [birth year] DOA: 09/13/2013 PCP: [name], MD  Assessment/Plan: 1. Enteral feeding. Patient with long-standing history of poorly controlled diabetes mellitus, diabetic gastroparesis, on enteral feeds. Given issues with G-J tube, with multiple G tube and G-J tubes in her past, Gen. surgery was consulted for J-tube placement. [name] felt that J-tube placement was not a good solution, and had multiple associated complications. Interventional radiology was consulted for GJ Tube replacement, s/p tube placement. 2. Diabetic gastroparesis. Patient having multiple hospitalizations do to diabetic gastroparesis. She currently sees [name] at [facility]. On scheduled reglan TID. I spoke with Dr Derrill Kay of Gastroenterology at [facility] on 09/16/2013. He recommended consulting interventional radiology with temporizing GJ tube and start with Gatorade feeds with a goal of 1 L per day. He also recommended addressing her pain. With regard to gastric pacemaker implant he stated that this would not help with pain and would need to be addressed in the outpatient setting as this can take months to get approved. I inquired about the possibility of the range in the hospital to hospital transfer to [facility] for further evaluation. [name] did not feel that a transfer was necessary as the GI service there would not have anything to add. 3. C diff Colitis. Patient stool, back positive for C. difficile as she was started on Flagyl 500 mg IV q. 8 hours. 4. Type 2 diabetes mellitus, poorly controlled, continue Lantus 10 units subcutaneous daily with Accu-Cheks and sliding scale coverage. Blood sugars appear better controlled today. She is presently n.p.o. 5. Hypertension. Will continue PRN Labetolol and Hydralazine. 6. Hematemesis. Patient having an episode of hematemesis for which GI was consulted. She underwent upper  endoscopy which did not reveal active bleeding or significant blood within the stomach. Noted was mild to moderate erosive esophagitis likely related to emesis. Continue PPI therapy.  7. Gastroesophageal reflux disease. PPI therapy 8. DVT prophylaxis. SCDs  Code Status: Full Family Communication: Pt in room (indicate person spoken with, relationship, and if by phone, the number) Disposition Plan: Pending   Consultants:  GI  Surgery  IR  Procedures:  G-J tube replacement by IR 09/18/13  Antibiotics:    HPI/Subjective: No acute events.  Objective: Filed Vitals:   09/17/13 1958 09/18/13 0400 09/18/13 0754 09/18/13 1216  BP: 161/89 198/105 169/92 144/81  Pulse: 79 92  84  Temp: 98.3 F (36.8 C) 98.9 F (37.2 C)  98.6 F (37 C)  TempSrc: Oral Oral  Oral  Resp: 19 20  16   Height:      Weight:      SpO2: 97% 98%  100%    Intake/Output Summary (Last 24 hours) at 09/18/13 1354 Last data filed at 09/18/13 1018  Gross per 24 hour  Intake 2357.23 ml  Output   2691 ml  Net -333.77 ml   Filed Weights   09/13/13 1156  Weight: 66.4 kg (146 lb 6.2 oz)    Exam:   General:  Awake, in nad  Cardiovascular: regular, s1, s2  Respiratory: normal resp effort, no wheezing  Abdomen: soft, nondistended, feeding tube in place  Musculoskeletal: perfused, no clubbing   Data Reviewed: Basic Metabolic Panel:  Recent Labs Lab 09/13/13 0805 09/15/13 0545 09/16/13 0453 09/17/13 0540 09/18/13 0550  NA 129* 137 138 137 138  K 3.7 3.4* 2.9* 3.5* 3.2*  CL 84* 98 100 103 102  CO2 24 22 24 23 25   GLUCOSE 489*  83 102* 143* 178*  BUN 14 14 7  4* 4*  CREATININE 0.54 0.72 0.58 0.56 0.61  CALCIUM 9.7 8.6 8.1* 8.1* 8.6  MG  --   --  1.2* 1.3* 1.2*  PHOS  --   --   --   --  3.8   Liver Function Tests:  Recent Labs Lab 09/13/13 0805 09/18/13 0550  AST 18 15  ALT 12 8  ALKPHOS 136* 83  BILITOT <0.2* <0.2*  PROT 8.8* 6.6  ALBUMIN 3.1* 2.4*    Recent Labs Lab  09/13/13 0805  LIPASE 31   No results found for this basename: AMMONIA,  in the last 168 hours CBC:  Recent Labs Lab 09/13/13 0805 09/15/13 0545 09/16/13 0453 09/17/13 0540 09/18/13 0550  WBC 16.5* 7.8 4.2 3.6* 4.2  NEUTROABS 13.8*  --   --   --  2.2  HGB 12.5 9.5* 9.0* 9.7* 9.3*  HCT 36.5 28.2* 27.1* 28.9* 28.2*  MCV 85.5 86.8 86.6 86.5 87.3  PLT 352 341 236 212 178   Cardiac Enzymes: No results found for this basename: CKTOTAL, CKMB, CKMBINDEX, TROPONINI,  in the last 168 hours BNP (last 3 results) No results found for this basename: PROBNP,  in the last 8760 hours CBG:  Recent Labs Lab 09/17/13 1950 09/17/13 2336 09/18/13 0404 09/18/13 0722 09/18/13 1202  GLUCAP 120* 144* 135* 190* 143*    Recent Results (from the past 240 hour(s))  CLOSTRIDIUM DIFFICILE BY PCR     Status: Abnormal   Collection Time    09/15/13  2:32 PM      Result Value Ref Range Status   C difficile by pcr POSITIVE (*) NEGATIVE Final   Comment: CRITICAL RESULT CALLED TO, READ BACK BY AND VERIFIED WITH:     CRAVEN,M RN 09/15/13 2007 WOOTEN,K     Performed at [facility]     Studies: Ir Fluoro Guide Cv Line Right  09/17/2013   CLINICAL DATA:  Gastroparesis, needs access for parenteral feeding support  EXAM: PICC PLACEMENT WITH ULTRASOUND AND FLUOROSCOPY  TECHNIQUE: After written informed consent was obtained, patient was placed in the supine position on angiographic table. Patency of the right tracheal vein was confirmed with ultrasound with image documentation. An appropriate skin site was determined. Skin site was marked. Region was prepped using maximum barrier technique including cap and mask, sterile gown, sterile gloves, large sterile sheet, and Chlorhexidine as cutaneous antisepsis. The region was infiltrated locally with 1% lidocaine. Under real-time ultrasound guidance, the right tracheal vein was accessed with a 21 gauge micropuncture needle; the needle tip within the vein was  confirmed with ultrasound image documentation. Needle exchanged over a 018 guidewire for a peel-away sheath, through which a 5-French double-lumen power injectable PICC trimmed to 38cm was advanced, positioned with its tip near the cavoatrial junction. Spot chest radiograph confirms appropriate catheter position. Catheter was flushed per protocol and secured externally with 0-Prolene sutures. The patient tolerated procedure well, with no immediate complication.  FLUOROSCOPY TIME:  6 seconds  IMPRESSION: Technically successful five [place] double lumen power injectable PICC placement   Electronically Signed   By: Arne Cleveland M.D.   On: 09/17/2013 11:48   Ir US Guide Vasc Access Right  09/17/2013   CLINICAL DATA:  Gastroparesis, needs access for parenteral feeding support  EXAM: PICC PLACEMENT WITH ULTRASOUND AND FLUOROSCOPY  TECHNIQUE: After written informed consent was obtained, patient was placed in the supine position on angiographic table. Patency of the right tracheal vein  was confirmed with ultrasound with image documentation. An appropriate skin site was determined. Skin site was marked. Region was prepped using maximum barrier technique including cap and mask, sterile gown, sterile gloves, large sterile sheet, and Chlorhexidine as cutaneous antisepsis. The region was infiltrated locally with 1% lidocaine. Under real-time ultrasound guidance, the right tracheal vein was accessed with a 21 gauge micropuncture needle; the needle tip within the vein was confirmed with ultrasound image documentation. Needle exchanged over a 018 guidewire for a peel-away sheath, through which a 5-French double-lumen power injectable PICC trimmed to 38cm was advanced, positioned with its tip near the cavoatrial junction. Spot chest radiograph confirms appropriate catheter position. Catheter was flushed per protocol and secured externally with 0-Prolene sutures. The patient tolerated procedure well, with no immediate  complication.  FLUOROSCOPY TIME:  6 seconds  IMPRESSION: Technically successful five [place] double lumen power injectable PICC placement   Electronically Signed   By: Arne Cleveland M.D.   On: 09/17/2013 11:48    Scheduled Meds: . famotidine (PEPCID) IV  20 mg Intravenous Q12H  . insulin aspart  0-20 Units Subcutaneous 6 times per day  . insulin glargine  10 Units Subcutaneous QHS  . metoCLOPramide (REGLAN) injection  10 mg Intravenous 4 times per day  . metoprolol  10 mg Intravenous Once  . metronidazole  500 mg Intravenous Q8H  . mometasone-formoterol  2 puff Inhalation BID  . potassium chloride  10 mEq Intravenous Q1 Hr x 5  . sodium chloride  3 mL Intravenous Q12H   Continuous Infusions: . sodium chloride 35 mL/hr at 09/17/13 1800  . sodium chloride    . Marland KitchenTPN (CLINIMIX-E) Adult 40 mL/hr at 09/17/13 1817   And  . fat emulsion 240 mL (09/17/13 1817)  . Marland KitchenTPN (CLINIMIX-E) Adult     And  . fat emulsion      Active Problems:   Intractable nausea and vomiting   Dyslipidemia   GERD (gastroesophageal reflux disease)   Hypertension   DM (diabetes mellitus), type 2, uncontrolled   Dehydration   Hyponatremia   Leukocytosis, unspecified   Abdominal pain   Diabetic neuropathy   Hyperglycemia   Hypochloremia   Hemoptysis   Diabetic gastroparesis  Time spent: 7min  Yonathan Perrow, [facility] Pager [phone]. If 7PM-7AM, please contact night-coverage at [email or web address], password [facility] 09/18/2013, 1:54 PM  LOS: 5 days

## 2013-09-18 NOTE — Progress Notes (Signed)
[facility] Progress Note  Subjective: Abdominal pain , diarrhea continue but improving  Objective: Vital signs in last 24 hours: Temp:  [98.2 F (36.8 C)-98.9 F (37.2 C)] 98.9 F (37.2 C) (02/18 0400) Pulse Rate:  [79-92] 92 (02/18 0400) Resp:  [19-20] 20 (02/18 0400) BP: (152-198)/(89-105) 169/92 mmHg (02/18 0754) SpO2:  [97 %-100 %] 98 % (02/18 0400) Weight change:    [id number] appears in distress, abdomen diffusely tender.  Lab Results: Results for orders placed during the hospital encounter of 09/13/13 (from the past 24 hour(s))  GLUCOSE, CAPILLARY     Status: None   Collection Time    09/17/13 11:51 AM      Result Value Ref Range   Glucose-Capillary 91  70 - 99 mg/dL  GLUCOSE, CAPILLARY     Status: None   Collection Time    09/17/13  4:51 PM      Result Value Ref Range   Glucose-Capillary 96  70 - 99 mg/dL  GLUCOSE, CAPILLARY     Status: Abnormal   Collection Time    09/17/13  7:50 PM      Result Value Ref Range   Glucose-Capillary 120 (*) 70 - 99 mg/dL  GLUCOSE, CAPILLARY     Status: Abnormal   Collection Time    09/17/13 11:36 PM      Result Value Ref Range   Glucose-Capillary 144 (*) 70 - 99 mg/dL  GLUCOSE, CAPILLARY     Status: Abnormal   Collection Time    09/18/13  4:04 AM      Result Value Ref Range   Glucose-Capillary 135 (*) 70 - 99 mg/dL  COMPREHENSIVE METABOLIC PANEL     Status: Abnormal   Collection Time    09/18/13  5:50 AM      Result Value Ref Range   Sodium 138  137 - 147 mEq/L   Potassium 3.2 (*) 3.7 - 5.3 mEq/L   Chloride 102  96 - 112 mEq/L   CO2 25  19 - 32 mEq/L   Glucose, Bld 178 (*) 70 - 99 mg/dL   BUN 4 (*) 6 - 23 mg/dL   Creatinine, Ser 0.61  0.50 - 1.10 mg/dL   Calcium 8.6  8.4 - 10.5 mg/dL   Total Protein 6.6  6.0 - 8.3 g/dL   Albumin 2.4 (*) 3.5 - 5.2 g/dL   AST 15  0 - 37 U/L   ALT 8  0 - 35 U/L   Alkaline Phosphatase 83  39 - 117 U/L   Total Bilirubin <0.2 (*) 0.3 - 1.2 mg/dL   GFR calc non Af Amer >90   >90 mL/min   GFR calc Af Amer >90  >90 mL/min  MAGNESIUM     Status: Abnormal   Collection Time    09/18/13  5:50 AM      Result Value Ref Range   Magnesium 1.2 (*) 1.5 - 2.5 mg/dL  PHOSPHORUS     Status: None   Collection Time    09/18/13  5:50 AM      Result Value Ref Range   Phosphorus 3.8  2.3 - 4.6 mg/dL  TRIGLYCERIDES     Status: Abnormal   Collection Time    09/18/13  5:50 AM      Result Value Ref Range   Triglycerides 170 (*) <150 mg/dL  CBC     Status: Abnormal   Collection Time    09/18/13  5:50 AM      Result Value  Ref Range   WBC 4.2  4.0 - 10.5 K/uL   RBC 3.23 (*) 3.87 - 5.11 MIL/uL   Hemoglobin 9.3 (*) 12.0 - 15.0 g/dL   HCT 28.2 (*) 36.0 - 46.0 %   MCV 87.3  78.0 - 100.0 fL   MCH 28.8  26.0 - 34.0 pg   MCHC 33.0  30.0 - 36.0 g/dL   RDW 15.0  11.5 - 15.5 %   Platelets 178  150 - 400 K/uL  DIFFERENTIAL     Status: Abnormal   Collection Time    09/18/13  5:50 AM      Result Value Ref Range   Neutrophils Relative % 52  43 - 77 %   Neutro Abs 2.2  1.7 - 7.7 K/uL   Lymphocytes Relative 30  12 - 46 %   Lymphs Abs 1.3  0.7 - 4.0 K/uL   Monocytes Relative 10  3 - 12 %   Monocytes Absolute 0.4  0.1 - 1.0 K/uL   Eosinophils Relative 7 (*) 0 - 5 %   Eosinophils Absolute 0.3  0.0 - 0.7 K/uL   Basophils Relative 1  0 - 1 %   Basophils Absolute 0.0  0.0 - 0.1 K/uL  GLUCOSE, CAPILLARY     Status: Abnormal   Collection Time    09/18/13  7:22 AM      Result Value Ref Range   Glucose-Capillary 190 (*) 70 - 99 mg/dL   Comment 1 Notify RN     Comment 2 Documented in Chart      Studies/Results: Ir Fluoro Guide Cv Line Right  09/17/2013   CLINICAL DATA:  Gastroparesis, needs access for parenteral feeding support  EXAM: PICC PLACEMENT WITH ULTRASOUND AND FLUOROSCOPY  TECHNIQUE: After written informed consent was obtained, patient was placed in the supine position on angiographic table. Patency of the right tracheal vein was confirmed with ultrasound with image  documentation. An appropriate skin site was determined. Skin site was marked. Region was prepped using maximum barrier technique including cap and mask, sterile gown, sterile gloves, large sterile sheet, and Chlorhexidine as cutaneous antisepsis. The region was infiltrated locally with 1% lidocaine. Under real-time ultrasound guidance, the right tracheal vein was accessed with a 21 gauge micropuncture needle; the needle tip within the vein was confirmed with ultrasound image documentation. Needle exchanged over a 018 guidewire for a peel-away sheath, through which a 5-French double-lumen power injectable PICC trimmed to 38cm was advanced, positioned with its tip near the cavoatrial junction. Spot chest radiograph confirms appropriate catheter position. Catheter was flushed per protocol and secured externally with 0-Prolene sutures. The patient tolerated procedure well, with no immediate complication.  FLUOROSCOPY TIME:  6 seconds  IMPRESSION: Technically successful five [place] double lumen power injectable PICC placement   Electronically Signed   By: Arne Cleveland M.D.   On: 09/17/2013 11:48   Ir US Guide Vasc Access Right  09/17/2013   CLINICAL DATA:  Gastroparesis, needs access for parenteral feeding support  EXAM: PICC PLACEMENT WITH ULTRASOUND AND FLUOROSCOPY  TECHNIQUE: After written informed consent was obtained, patient was placed in the supine position on angiographic table. Patency of the right tracheal vein was confirmed with ultrasound with image documentation. An appropriate skin site was determined. Skin site was marked. Region was prepped using maximum barrier technique including cap and mask, sterile gown, sterile gloves, large sterile sheet, and Chlorhexidine as cutaneous antisepsis. The region was infiltrated locally with 1% lidocaine. Under real-time ultrasound guidance, the right  tracheal vein was accessed with a 21 gauge micropuncture needle; the needle tip within the vein was confirmed with  ultrasound image documentation. Needle exchanged over a 018 guidewire for a peel-away sheath, through which a 5-French double-lumen power injectable PICC trimmed to 38cm was advanced, positioned with its tip near the cavoatrial junction. Spot chest radiograph confirms appropriate catheter position. Catheter was flushed per protocol and secured externally with 0-Prolene sutures. The patient tolerated procedure well, with no immediate complication.  FLUOROSCOPY TIME:  6 seconds  IMPRESSION: Technically successful five [place] double lumen power injectable PICC placement   Electronically Signed   By: Arne Cleveland M.D.   On: 09/17/2013 11:48      Assessment: 1. Diabetic gastroparesis 2. C diff diarrhea 3. Chronic abdominal pain 4. Malnutrition  1. Plan: Continue IV metoclopramide 2.  IV flagyl 3. PICC line for TPN for now                                Rolonda Pontarelli C 09/18/2013, 9:54 AM

## 2013-09-18 NOTE — Progress Notes (Signed)
5 Days Post-Op  Subjective: Pt was gone all morning getting G-tube replaced.  It was successful.  She is having less nausea, no vomiting, less abdominal pain.  C/o abdominal pain at the tube site.    Objective: Vital signs in last 24 hours: Temp:  [98.2 F (36.8 C)-98.9 F (37.2 C)] 98.9 F (37.2 C) (02/18 0400) Pulse Rate:  [79-92] 92 (02/18 0400) Resp:  [19-20] 20 (02/18 0400) BP: (152-198)/(89-105) 169/92 mmHg (02/18 0754) SpO2:  [97 %-100 %] 98 % (02/18 0400) Last BM Date: 09/17/13  Intake/Output from previous day: 02/17 0701 - 02/18 0700 In: 4234.3 [I.V.:3085.2; IV Piggyback:550; TPN:599.1] Out: 2751 [Urine:2125; Emesis/NG output:1; Drains:625] Intake/Output this shift: Total I/O In: 350 [I.V.:50; IV Piggyback:300] Out: 540 [Urine:400; Drains:140]  PE: Gen:  Alert, NAD, pleasant Abd: Soft, mild tenderness over g tube site, ND, +BS, g-tube draining greenish yellow fluid, many abdominal scars noted   Lab Results:   Recent Labs  09/17/13 0540 09/18/13 0550  WBC 3.6* 4.2  HGB 9.7* 9.3*  HCT 28.9* 28.2*  PLT 212 178   BMET  Recent Labs  09/17/13 0540 09/18/13 0550  NA 137 138  K 3.5* 3.2*  CL 103 102  CO2 23 25  GLUCOSE 143* 178*  BUN 4* 4*  CREATININE 0.56 0.61  CALCIUM 8.1* 8.6   PT/INR No results found for this basename: LABPROT, INR,  in the last 72 hours CMP     Component Value Date/Time   NA 138 09/18/2013 0550   K 3.2* 09/18/2013 0550   CL 102 09/18/2013 0550   CO2 25 09/18/2013 0550   GLUCOSE 178* 09/18/2013 0550   BUN 4* 09/18/2013 0550   CREATININE 0.61 09/18/2013 0550   CALCIUM 8.6 09/18/2013 0550   PROT 6.6 09/18/2013 0550   ALBUMIN 2.4* 09/18/2013 0550   AST 15 09/18/2013 0550   ALT 8 09/18/2013 0550   ALKPHOS 83 09/18/2013 0550   BILITOT <0.2* 09/18/2013 0550   GFRNONAA >90 09/18/2013 0550   GFRAA >90 09/18/2013 0550   Lipase     Component Value Date/Time   LIPASE 31 09/13/2013 0805       Studies/Results: Ir Fluoro Guide Cv Line  Right  09/17/2013   CLINICAL DATA:  Gastroparesis, needs access for parenteral feeding support  EXAM: PICC PLACEMENT WITH ULTRASOUND AND FLUOROSCOPY  TECHNIQUE: After written informed consent was obtained, patient was placed in the supine position on angiographic table. Patency of the right tracheal vein was confirmed with ultrasound with image documentation. An appropriate skin site was determined. Skin site was marked. Region was prepped using maximum barrier technique including cap and mask, sterile gown, sterile gloves, large sterile sheet, and Chlorhexidine as cutaneous antisepsis. The region was infiltrated locally with 1% lidocaine. Under real-time ultrasound guidance, the right tracheal vein was accessed with a 21 gauge micropuncture needle; the needle tip within the vein was confirmed with ultrasound image documentation. Needle exchanged over a 018 guidewire for a peel-away sheath, through which a 5-French double-lumen power injectable PICC trimmed to 38cm was advanced, positioned with its tip near the cavoatrial junction. Spot chest radiograph confirms appropriate catheter position. Catheter was flushed per protocol and secured externally with 0-Prolene sutures. The patient tolerated procedure well, with no immediate complication.  FLUOROSCOPY TIME:  6 seconds  IMPRESSION: Technically successful five [place] double lumen power injectable PICC placement   Electronically Signed   By: Arne Cleveland M.D.   On: 09/17/2013 11:48   Ir US Guide Vasc Access Right  09/17/2013   CLINICAL DATA:  Gastroparesis, needs access for parenteral feeding support  EXAM: PICC PLACEMENT WITH ULTRASOUND AND FLUOROSCOPY  TECHNIQUE: After written informed consent was obtained, patient was placed in the supine position on angiographic table. Patency of the right tracheal vein was confirmed with ultrasound with image documentation. An appropriate skin site was determined. Skin site was marked. Region was prepped using maximum  barrier technique including cap and mask, sterile gown, sterile gloves, large sterile sheet, and Chlorhexidine as cutaneous antisepsis. The region was infiltrated locally with 1% lidocaine. Under real-time ultrasound guidance, the right tracheal vein was accessed with a 21 gauge micropuncture needle; the needle tip within the vein was confirmed with ultrasound image documentation. Needle exchanged over a 018 guidewire for a peel-away sheath, through which a 5-French double-lumen power injectable PICC trimmed to 38cm was advanced, positioned with its tip near the cavoatrial junction. Spot chest radiograph confirms appropriate catheter position. Catheter was flushed per protocol and secured externally with 0-Prolene sutures. The patient tolerated procedure well, with no immediate complication.  FLUOROSCOPY TIME:  6 seconds  IMPRESSION: Technically successful five [place] double lumen power injectable PICC placement   Electronically Signed   By: Arne Cleveland M.D.   On: 09/17/2013 11:48    Anti-infectives: Anti-infectives   Start     Dose/Rate Route Frequency Ordered Stop   09/18/13 0200  metroNIDAZOLE (FLAGYL) IVPB 500 mg     500 mg 100 mL/hr over 60 Minutes Intravenous Every 8 hours 09/17/13 1658     09/15/13 2200  metroNIDAZOLE (FLAGYL) tablet 500 mg  Status:  Discontinued     500 mg Oral 3 times per day 09/15/13 2012 09/15/13 2015   09/15/13 2100  metroNIDAZOLE (FLAGYL) IVPB 500 mg  Status:  Discontinued     500 mg 100 mL/hr over 60 Minutes Intravenous 3 times per day 09/15/13 2015 09/17/13 1658       Assessment/Plan 1. Gastroparesis/underlying GI motility disorder   Has been followed by [name] at [facility] - but he refused transfer for a chronic problem he has been seeing her for.  Unclear of reason for refusal.   Feels better. Nausea better.  2. Gastrostomy tube was pulled out, IR successfully replaced it.    Placing a J tube would come with its own set of risks for the patient.    Hopefully  this type of G-tube (with external flange) will be sufficient and we can avoid J tube placement all together.    No acute surgical needs at this time, will sign off, call with questions/concerns.  Should follow up with [name] at [facility]. 3. Malnutrition - Plan TPN  4. C. Diff - on Flagyl - 09/16/2013 >>>  5. On disability since age 36.  38. Hgb - 9.3 - 09/18/2013   LOS: 5 days    DORT, MEGAN 09/18/2013, 10:52 AM Pager: [phone]  Agree with above. Very difficult patient with difficult problem.  Alphonsa Overall, MD, [facility] Pager: [phone] Office phone:  [phone]

## 2013-09-19 ENCOUNTER — Inpatient Hospital Stay (HOSPITAL_COMMUNITY): Payer: Medicare Other

## 2013-09-19 LAB — GLUCOSE, CAPILLARY
GLUCOSE-CAPILLARY: 153 mg/dL — AB (ref 70–99)
GLUCOSE-CAPILLARY: 111 mg/dL — AB (ref 70–99)
GLUCOSE-CAPILLARY: 173 mg/dL — AB (ref 70–99)
Glucose-Capillary: 112 mg/dL — ABNORMAL HIGH (ref 70–99)
Glucose-Capillary: 121 mg/dL — ABNORMAL HIGH (ref 70–99)
Glucose-Capillary: 169 mg/dL — ABNORMAL HIGH (ref 70–99)
Glucose-Capillary: 77 mg/dL (ref 70–99)

## 2013-09-19 LAB — COMPREHENSIVE METABOLIC PANEL
ALBUMIN: 2.4 g/dL — AB (ref 3.5–5.2)
ALT: 6 U/L (ref 0–35)
AST: 13 U/L (ref 0–37)
Alkaline Phosphatase: 80 U/L (ref 39–117)
BUN: 11 mg/dL (ref 6–23)
CO2: 23 mEq/L (ref 19–32)
CREATININE: 0.64 mg/dL (ref 0.50–1.10)
Calcium: 8.7 mg/dL (ref 8.4–10.5)
Chloride: 103 mEq/L (ref 96–112)
GFR calc Af Amer: >90 mL/min (ref >90)
Glucose, Bld: 164 mg/dL — ABNORMAL HIGH (ref 70–99)
Potassium: 3.4 mEq/L — ABNORMAL LOW (ref 3.7–5.3)
Sodium: 139 mEq/L (ref 137–147)
Total Bilirubin: <0.2 mg/dL — ABNORMAL LOW (ref 0.3–1.2)
Total Protein: 6.5 g/dL (ref 6.0–8.3)

## 2013-09-19 LAB — MAGNESIUM: Magnesium: 1.4 mg/dL — ABNORMAL LOW (ref 1.5–2.5)

## 2013-09-19 LAB — PHOSPHORUS: PHOSPHORUS: 4.1 mg/dL (ref 2.3–4.6)

## 2013-09-19 MED ORDER — MAGNESIUM SULFATE 40 MG/ML IJ SOLN
2.0000 g | Freq: Once | INTRAMUSCULAR | Status: AC
  Administered 2013-09-19: 2 g via INTRAVENOUS
  Filled 2013-09-19: qty 50

## 2013-09-19 MED ORDER — M.V.I. ADULT IV INJ
INTRAVENOUS | Status: AC
  Administered 2013-09-19: 17:00:00 via INTRAVENOUS
  Filled 2013-09-19: qty 2000

## 2013-09-19 MED ORDER — HYDROMORPHONE HCL PF 1 MG/ML IJ SOLN
1.0000 mg | Freq: Once | INTRAMUSCULAR | Status: AC
  Administered 2013-09-19: 1 mg via INTRAVENOUS
  Filled 2013-09-19: qty 1

## 2013-09-19 MED ORDER — FAT EMULSION 20 % IV EMUL
250.0000 mL | INTRAVENOUS | Status: AC
  Administered 2013-09-19: 250 mL via INTRAVENOUS
  Filled 2013-09-19: qty 250

## 2013-09-19 MED ORDER — POTASSIUM CHLORIDE 10 MEQ/100ML IV SOLN
10.0000 meq | INTRAVENOUS | Status: AC
  Administered 2013-09-19 (×4): 10 meq via INTRAVENOUS
  Filled 2013-09-19 (×4): qty 100

## 2013-09-19 MED ORDER — VITAL AF 1.2 CAL PO LIQD
1000.0000 mL | ORAL | Status: DC
  Administered 2013-09-19 – 2013-09-20 (×2): 1000 mL
  Filled 2013-09-19 (×2): qty 1000

## 2013-09-19 MED ORDER — DIPHENHYDRAMINE HCL 50 MG/ML IJ SOLN
12.5000 mg | Freq: Four times a day (QID) | INTRAMUSCULAR | Status: DC | PRN
  Administered 2013-09-19 – 2013-09-20 (×2): 12.5 mg via INTRAVENOUS
  Filled 2013-09-19 (×2): qty 1

## 2013-09-19 MED ORDER — HYDROMORPHONE HCL PF 1 MG/ML IJ SOLN
0.5000 mg | Freq: Once | INTRAMUSCULAR | Status: AC
  Administered 2013-09-19: 0.5 mg via INTRAVENOUS
  Filled 2013-09-19: qty 1

## 2013-09-19 NOTE — Progress Notes (Signed)
Tube feeding started at 10 ml/h.  After approx. 30 minutes patient started to complain of increased nausea and pain.  Dr. Wyline Copas notified and orders for one time dose of dilaudid and benadryl given.  Patient then started vomiting watery, beige colored liquid.  Dr. Wyline Copas notified.  Tube feeding discontinued.  Administered pain and nausea medication to patient.  Patient teary and rocking from side to side in bed stating she wants to go home.  This morning patient was ambulating and smiling, appeared to be feeling very well and stated so to Probation officer.  Will continue to monitor.

## 2013-09-19 NOTE — Progress Notes (Addendum)
PARENTERAL NUTRITION CONSULT NOTE - INITIAL  Pharmacy Consult for TNA Indication: intolerance to enteral feeds  Allergies  Allergen Reactions  . Cefadroxil Other (See Comments)    Not sure of effects-listed as allergy on list from home  . Compazine [Prochlorperazine Edisylate] Hives  . Darvocet [Propoxyphene N-Acetaminophen] Other (See Comments)    UNKNOWN  . Zofran [Ondansetron Hcl] Hives  . Compazine Hives    Tolerates promethazine  . Nsaids Hives  . Penicillins Hives  . Zofran [Ondansetron] Hives    Patient Measurements: Height: 5\' 4"  (162.6 cm) Weight: 146 lb 6.2 oz (66.4 kg) IBW/kg (Calculated) : 54.7  Vital Signs: Temp: 98.8 F (37.1 C) (02/19 0500) Temp src: Oral (02/19 0500) BP: 139/70 mmHg (02/19 0500) Pulse Rate: 79 (02/19 0500) Intake/Output from previous day: 02/18 0701 - 02/19 0700 In: 2505.7 [I.V.:785.7; IV Piggyback:950; TPN:770] Out: 1040 [Urine:900; Drains:140]  Labs:  Recent Labs  09/17/13 0540 09/18/13 0550  WBC 3.6* 4.2  HGB 9.7* 9.3*  HCT 28.9* 28.2*  PLT 212 178     Recent Labs  09/17/13 0540 09/18/13 0550  NA 137 138  K 3.5* 3.2*  CL 103 102  CO2 23 25  GLUCOSE 143* 178*  BUN 4* 4*  CREATININE 0.56 0.61  CALCIUM 8.1* 8.6  MG 1.3* 1.2*  PHOS  --  3.8  PROT  --  6.6  ALBUMIN  --  2.4*  AST  --  15  ALT  --  8  ALKPHOS  --  83  BILITOT  --  <0.2*  PREALBUMIN  --  17.7*  TRIG  --  170*   Estimated Creatinine Clearance: 85 ml/min (by C-G formula based on Cr of 0.61).    Recent Labs  09/18/13 1607 09/18/13 2113 09/19/13 0046  GLUCAP 127* 174* 112*    CBGs & Insulin requirements past 24 hours:  - CBGs 112 - 190, required 16 units of Novolog sensitive SSI q4h  - Lantus 10 units qhs  Assessment:  57 yoF with h/o pancreatitis, DM with diabetic gastroparesis s/p J-tube placement with multiple issues presented 2/13 with N/V/abd pain. The J-tube was removed and a new G-tube was placed. Patient unable to tolerate  intragastric feedings given N/V/abd pain. Patient also found with C.difficile infection this admission (on Flagyl). Unable to transfer patient to [facility] as MDs there cannot offer more options. GI, surgery, IR on board - plan GJ tube replacement. In the meantime, IR placced PICC and start TNA.   2/18: D#3 TPN, GJ  tube re-placed by IR 2/18  Nutritional Goals:  - RD recs 2/17: 1600-1800 Kcal/day, 80-95 g protein/day, 2.1L fluid per day - Clinimix E 5/15 @ 75 ml/hr + IVF 20% at 10 ml/hr will provide: 90 g protein/day, 1758 Kcal/day, 1.8 L/day  Current nutrition:  - Diet: NPO starting 2/13 - TNA:  started 2/17 PM  - mIVF: NS @ 20 ml/hr  Labs: Electrolytes:  K+ low at 3.4, will replace, Mag 1.4, corrected Ca = 9.98 Renal Function: Scr wnl/stable, good UOP Hepatic Function: WNL Pre-Albumin: 17.7 (2/18) Triglycerides: 170 (2/18) CBGs: H/o uncontrolled DM on Novolog SSI q4h and Lantus 10 units qhs PTA (both resumed inpatient). Couple of hypoglycemic events few days ago, CBGs now increasing above goal of 150mg /dl  Plan:  At 1800 tonight  Infuse TPN at 50 ml/hr tonight as TF to start today as GJ tube replaced successfully 2/18  TNA to contain IV fat emulsion 20% at 10 ml/hr daily  Standard multivitamins daily  Due to national backorder, trace elements MWF only  Magnesium sulfate 2g IV x 1   KCl 10 meq IVP x 4 runs  Continue Novolog SSI to resistant scale q4h.  Continue Lantus 10 units qhs   Will hold off adding insulin to TPN tonight as will likely be tapered off tomorrow  TNA labs Monday/Thursdays  Pharmacy will follow up daily  Doreene Eland, PharmD, BCPS.   Pager: [phone] 09/19/2013 7:23 AM

## 2013-09-19 NOTE — Progress Notes (Signed)
[facility] PROGRESS NOTE  [name] [id number] DOB: March 18, 1971 DOA: 09/13/2013 PCP: [name], MD  Assessment/Plan: 1. Enteral feeding. Patient with long-standing history of poorly controlled diabetes mellitus, diabetic gastroparesis, on enteral feeds. Given issues with G-J tube, with multiple G tube and G-J tubes in her past, Gen. surgery was consulted for J-tube placement. [name] felt that J-tube placement was not a good solution, and had multiple associated complications. Interventional radiology was consulted for GJ Tube replacement, s/p tube placement. To resume tube feeds 2. Diabetic gastroparesis. Patient having multiple hospitalizations do to diabetic gastroparesis. She currently sees [name] at [facility]. On scheduled reglan TID. I spoke with Dr Derrill Kay of Gastroenterology at [facility] on 09/16/2013. He recommended consulting interventional radiology with temporizing GJ tube. He also recommended addressing her pain. With regard to gastric pacemaker implant he stated that this would not help with pain and would need to be addressed in the outpatient setting as this can take months to get approved. [name] did not feel that a transfer was necessary as the GI service there would not have anything to add. 3. C diff Colitis. Patient stool, back positive for C. difficile as she was started on Flagyl 500 mg IV q. 8 hours. 4. Type 2 diabetes mellitus, poorly controlled, continue Lantus 10 units subcutaneous daily with Accu-Cheks and sliding scale coverage. Blood sugars appear better controlled today. She is presently n.p.o. 5. Hypertension. Will continue PRN Labetolol and Hydralazine. 6. Hematemesis. Patient having an episode of hematemesis for which GI was consulted. She underwent upper endoscopy which did not reveal active bleeding or significant blood within the stomach. Noted was mild to moderate erosive esophagitis likely related to emesis. Continue  PPI therapy.  7. Gastroesophageal reflux disease. PPI therapy 8. DVT prophylaxis. SCDs  Code Status: Full Family Communication: Pt in room (indicate person spoken with, relationship, and if by phone, the number) Disposition Plan: Pending   Consultants:  GI  Surgery  IR  Procedures:  G-J tube replacement by IR 09/18/13  Antibiotics:    HPI/Subjective: No acute events. Still complains of abd pain  Objective: Filed Vitals:   09/18/13 1400 09/18/13 2114 09/19/13 0218 09/19/13 0500  BP: 150/86 130/78 144/72 139/70  Pulse: 90 88 85 79  Temp: 98.9 F (37.2 C)   98.8 F (37.1 C)  TempSrc: Oral   Oral  Resp: 16 20  17   Height:      Weight:      SpO2: 100% 100% 100% 100%    Intake/Output Summary (Last 24 hours) at 09/19/13 1301 Last data filed at 09/19/13 0953  Gross per 24 hour  Intake 1955.67 ml  Output   1400 ml  Net 555.67 ml   Filed Weights   09/13/13 1156  Weight: 66.4 kg (146 lb 6.2 oz)    Exam:   General:  Awake, in nad  Cardiovascular: regular, s1, s2  Respiratory: normal resp effort, no wheezing  Abdomen: soft, nondistended, feeding tube in place  Musculoskeletal: perfused, no clubbing   Data Reviewed: Basic Metabolic Panel:  Recent Labs Lab 09/15/13 0545 09/16/13 0453 09/17/13 0540 09/18/13 0550 09/19/13 0645  NA 137 138 137 138 139  K 3.4* 2.9* 3.5* 3.2* 3.4*  CL 98 100 103 102 103  CO2 22 24 23 25 23   GLUCOSE 83 102* 143* 178* 164*  BUN 14 7 4* 4* 11  CREATININE 0.72 0.58 0.56 0.61 0.64  CALCIUM 8.6 8.1* 8.1* 8.6 8.7  MG  --  1.2* 1.3* 1.2* 1.4*  PHOS  --   --   --  3.8 4.1   Liver Function Tests:  Recent Labs Lab 09/13/13 0805 09/18/13 0550 09/19/13 0645  AST 18 15 13   ALT 12 8 6   ALKPHOS 136* 83 80  BILITOT <0.2* <0.2* <0.2*  PROT 8.8* 6.6 6.5  ALBUMIN 3.1* 2.4* 2.4*    Recent Labs Lab 09/13/13 0805  LIPASE 31   No results found for this basename: AMMONIA,  in the last 168 hours CBC:  Recent Labs Lab  09/13/13 0805 09/15/13 0545 09/16/13 0453 09/17/13 0540 09/18/13 0550  WBC 16.5* 7.8 4.2 3.6* 4.2  NEUTROABS 13.8*  --   --   --  2.2  HGB 12.5 9.5* 9.0* 9.7* 9.3*  HCT 36.5 28.2* 27.1* 28.9* 28.2*  MCV 85.5 86.8 86.6 86.5 87.3  PLT 352 341 236 212 178   Cardiac Enzymes: No results found for this basename: CKTOTAL, CKMB, CKMBINDEX, TROPONINI,  in the last 168 hours BNP (last 3 results) No results found for this basename: PROBNP,  in the last 8760 hours CBG:  Recent Labs Lab 09/18/13 2113 09/19/13 0046 09/19/13 0503 09/19/13 0748 09/19/13 1200  GLUCAP 174* 112* 169* 153* 111*    Recent Results (from the past 240 hour(s))  CLOSTRIDIUM DIFFICILE BY PCR     Status: Abnormal   Collection Time    09/15/13  2:32 PM      Result Value Ref Range Status   C difficile by pcr POSITIVE (*) NEGATIVE Final   Comment: CRITICAL RESULT CALLED TO, READ BACK BY AND VERIFIED WITH:     CRAVEN,M RN 09/15/13 2007 WOOTEN,K     Performed at [facility]     Studies: Ir Gj Tube Change  09/18/2013   CLINICAL DATA:  43 year old female with a significant history of gastroparesis with chronic nausea and vomiting. She has a percutaneous gastrojejunostomy tube which she frequently regurgitates from its normal position in the proximal small bowel into her esophagus requiring manipulation under fluoroscopy to repositioned the tube in the proximal small bowel. This has happened on a near monthly basis over the past several months.  She presents today for repositioning of the gastrojejunostomy tube prior to discharge. She will then follow-up with her gastroenterologist at [facility] for additional medical management of her chronic nausea in an effort to reduce vomiting and displacement of the tube from the proximal small bowel into the esophagus.  EXAM: JEJUNAL CATHETER REPLACEMENT  Date: 09/18/2013  TECHNIQUE: Informed consent was obtained from the patient following explanation  of the procedure, risks, benefits and alternatives. The patient understands, agrees and consents for the procedure. All questions were addressed. A time out was performed.  Maximal barrier sterile technique utilized including caps, mask, sterile gowns, sterile gloves, large sterile drape, hand hygiene, and Betadine skin prep.  The balloon retention device of the existing gastrostomy tube was deflated the tube removed. A new [employer] 92 French gastrostomy tube was then advanced through the mature stoma and into the stomach. The balloon was filled with 8 mL of dilute contrast material and pulled snug against the anterior abdominal wall. An angled catheter and Glidewire were then manipulated through the catheter and used to catheterize the distended duodenum. The catheter and Glidewire were ultimately advanced into the proximal jejunum. The Glidewire was then exchanged for an Amplatz wire. A new jejunal limb was then advanced over the wire and positioned with the tip in the distal horizontal  duodenum at the junction of the third and fourth portions of the duodenum. Position was confirmed by gently hand injected contrast material and a spot fluoroscopic image was obtained. Due to the chronic distension and redundancy of the duodenum, this represents the maximum depth this tube can be advanced.  ANESTHESIA/SEDATION: None required  CONTRAST:  35mL OMNIPAQUE IOHEXOL 300 MG/ML  SOLN  FLUOROSCOPY TIME:  6 min 54 seconds  PROCEDURE: 1. Up size of existing 18 French gastrostomy tube to a 20 [place] gastrostomy tube 2. Placement of jejunal tube coaxially through the gastrostomy tube Interventional Radiologist:  Criselda Peaches, MD  IMPRESSION: 1. Up size of existing 18 French percutaneous gastrostomy tube for a new 68 French balloon retention gastrostomy tube. 2. Successful placement of a J arm extension. The tip of the J is at the junction of the third and fourth portions of the duodenum. More distal placement of the  J arm would require special ordering of a longer device secondary to the chronic dilatation and patulous nature of the duodenum. Patient will follow-up with her gastroenterologist for medical management of her chronic nausea and emesis. If her emesis cannot be controlled and she continues to regurgitate the tube into her esophagus, we could consider ordering in an extra length device (if one is commercially available) to advance the tip farther out into the jejunum.  Signed,  Criselda Peaches, MD  [facility]  [facility]   Electronically Signed   By: Jacqulynn Cadet M.D.   On: 09/18/2013 14:39    Scheduled Meds: . famotidine (PEPCID) IV  20 mg Intravenous Q12H  . feeding supplement (VITAL AF 1.2 CAL)  1,000 mL Per Tube Q24H  . insulin aspart  0-20 Units Subcutaneous 6 times per day  . insulin glargine  10 Units Subcutaneous QHS  . metoCLOPramide (REGLAN) injection  10 mg Intravenous 4 times per day  . metoprolol  10 mg Intravenous Once  . metronidazole  500 mg Intravenous Q8H  . mometasone-formoterol  2 puff Inhalation BID  . potassium chloride  10 mEq Intravenous Q1 Hr x 4  . sodium chloride  3 mL Intravenous Q12H   Continuous Infusions: . sodium chloride 20 mL/hr at 09/18/13 1700  . Marland KitchenTPN (CLINIMIX-E) Adult 60 mL/hr at 09/18/13 1727   And  . fat emulsion 250 mL (09/18/13 1727)  . Marland KitchenTPN (CLINIMIX-E) Adult     And  . fat emulsion      Active Problems:   Intractable nausea and vomiting   Dyslipidemia   GERD (gastroesophageal reflux disease)   Hypertension   DM (diabetes mellitus), type 2, uncontrolled   Dehydration   Hyponatremia   Leukocytosis, unspecified   Abdominal pain   Diabetic neuropathy   Hyperglycemia   Hypochloremia   Hemoptysis   Diabetic gastroparesis  Time spent: 73min  [facility], [place] [facility] Pager [phone]. If 7PM-7AM, please contact night-coverage at [email or web address], password [facility] 09/19/2013, 1:01  PM  LOS: 6 days

## 2013-09-19 NOTE — Progress Notes (Signed)
NUTRITION FOLLOW UP  Intervention:   -Initiate Vital AF 1.2 @ 10 ml/hr via GJtube and increase by 10 ml every 8 hours to goal rate of 42 ml/hr (1 L./day). At goal rate, tube feeding regimen will provide 1200 kcal (75% est kcal needs), 75 grams of protein (94% est protein needs), and 811 ml of H2O.  -TPN per pharmacy -Diet advancement per MD -Will continue to monitor  Nutrition Dx:   Inadequate oral intake related to inability to eat as evidenced by NPO status.    Goal:   TF and TPN to meet >/= 90% of their estimated nutrition needs    Monitor:   TF order/tolerance, TPN tolerance, total protein/energy intake, GI profile, labs, weights    Assessment:   2/13: -Pt with hx of TF intolerance d/t nausea/vomiting and refusing bolus tube feeding. Per previous medical records, pt may be more willing to try continuous feedings over home bolus regimen  -Pt was receiving Vital 1.2 AF at 237 ml Q3H. 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 Will place recommendations to implement as warranted  -Sees gastroparesis specialist at [facility]  -Usual weight of 149 lbs per previous RD assessment  -Drinks Glucerna shakes occasionally. Allow as tolerated   2/16:  -Pt continues to experience TF intolerance TF was held after pt with nausea/vomiting after initiating Vital AF 1.2 at 20 ml/hr.  -Pt may undergo replacement G-J tube  -Pt also may be transferred to [facility] for further GI evaluation per discussion with RN. No plans to restart TF today  -Pt at risk for refeeding d/t prolonged period of suboptimal nutrition. Mag and K low. Recommend refeeding labs be run if TF initiated and opt for conservative advancemet    2/17: -Discussed TPN with pharmacy. Monitor refeeding labs and recommend conservative advancement  -See nutrition follow up note completed on 2/16 for additional information  -Will continue to monitor  2/19: -Per discussion with pharmacy and MD, plan to initiate TF today as pt s/p GJ tube replacement -Will recommend conservative initiation and advancement d/t pt's hx of TF tolerance and prolonged period of sub-optimal nutrition -GI recommended "gatorade feeds" with goal of 1L/day. Will modify previous TF goal rate to meet GI recommendations. -Vital AF 1.2 at 42 ml/hr will provide 1200 kcal (75% est kcal needs), and 75 gram protein (94% est protein needs) -Pharmacy did not advance TPN d/t start of TF. Recommended Clinimix 5/15 at 50 ml/hr with fat emulsion 20% of 10 ml/hr to provide approximately 50% of estimated nutrition needs   Height: Ht Readings from Last 1 Encounters:  09/13/13 5\' 4"  (1.626 m)    Weight Status:   Wt Readings from Last 1 Encounters:  09/13/13 146 lb 6.2 oz (66.4 kg)    Re-estimated needs:  Kcal: 1600-1800  Protein: 80-95 gram  Fluid: 2100 ml//daily   Skin: WDL  Diet Order: NPO   Intake/Output Summary (Last 24 hours) at 09/19/13 1337 Last data filed at 09/19/13 0953  Gross per 24 hour  Intake 1955.67 ml  Output   1400 ml  Net 555.67 ml    Last BM: 2/19   Labs:   Recent Labs Lab 09/17/13 0540 09/18/13 0550 09/19/13 0645  NA 137 138 139  K 3.5* 3.2* 3.4*  CL 103 102 103  CO2 23 25 23   BUN 4* 4* 11  CREATININE 0.56 0.61 0.64  CALCIUM 8.1* 8.6 8.7  MG 1.3* 1.2* 1.4*  PHOS  --  3.8 4.1  GLUCOSE 143* 178* 164*    CBG (last 3)   Recent Labs  09/19/13 0503 09/19/13 0748 09/19/13 1200  GLUCAP 169* 153* 111*    Scheduled Meds: . famotidine (PEPCID) IV  20 mg Intravenous Q12H  . feeding supplement (VITAL AF 1.2 CAL)  1,000 mL Per Tube Q24H  . insulin aspart  0-20 Units Subcutaneous 6 times per day  . insulin glargine  10 Units  Subcutaneous QHS  . metoCLOPramide (REGLAN) injection  10 mg Intravenous 4 times per day  . metoprolol  10 mg Intravenous Once  . metronidazole  500 mg Intravenous Q8H  . mometasone-formoterol  2 puff Inhalation BID  . potassium chloride  10 mEq Intravenous Q1 Hr x 4  . sodium chloride  3 mL Intravenous Q12H    Continuous Infusions: . sodium chloride 20 mL/hr at 09/18/13 1700  . Marland KitchenTPN (CLINIMIX-E) Adult 60 mL/hr at 09/18/13 1727   And  . fat emulsion 250 mL (09/18/13 1727)  . Marland KitchenTPN (CLINIMIX-E) Adult     And  . fat emulsion      Atlee Abide MS RD LDN Clinical Dietitian [id number]

## 2013-09-19 NOTE — Progress Notes (Signed)
Patient has drainage bag with clear green liquid located on end of G tube.  Paged IR and spoke with Ascencion Dike, PA-C and ok to discontinue drain in order to start tube feedings.

## 2013-09-19 NOTE — Progress Notes (Signed)
Pt had a 14-beat run of Vtach. MD notified. Pt was sleeping and asymptomatic. VSS. Will continue to monitor patient closely. Carnella Guadalaj[place] I

## 2013-09-20 DIAGNOSIS — R1013 Epigastric pain: Secondary | ICD-10-CM

## 2013-09-20 LAB — BASIC METABOLIC PANEL
BUN: 10 mg/dL (ref 6–23)
CALCIUM: 8.5 mg/dL (ref 8.4–10.5)
CO2: 24 mEq/L (ref 19–32)
CREATININE: 0.64 mg/dL (ref 0.50–1.10)
Chloride: 100 mEq/L (ref 96–112)
GFR calc Af Amer: >90 mL/min (ref >90)
GFR calc non Af Amer: >90 mL/min (ref >90)
GLUCOSE: 124 mg/dL — AB (ref 70–99)
Potassium: 3.5 mEq/L — ABNORMAL LOW (ref 3.7–5.3)
SODIUM: 135 meq/L — AB (ref 137–147)

## 2013-09-20 LAB — HEMOGLOBIN A1C
HEMOGLOBIN A1C: 8.6 % — AB (ref <5.7)
Mean Plasma Glucose: 200 mg/dL — ABNORMAL HIGH (ref <117)

## 2013-09-20 LAB — MAGNESIUM: Magnesium: 1.6 mg/dL (ref 1.5–2.5)

## 2013-09-20 LAB — GLUCOSE, CAPILLARY
GLUCOSE-CAPILLARY: 121 mg/dL — AB (ref 70–99)
GLUCOSE-CAPILLARY: 110 mg/dL — AB (ref 70–99)
GLUCOSE-CAPILLARY: 135 mg/dL — AB (ref 70–99)
Glucose-Capillary: 118 mg/dL — ABNORMAL HIGH (ref 70–99)
Glucose-Capillary: 156 mg/dL — ABNORMAL HIGH (ref 70–99)
Glucose-Capillary: 168 mg/dL — ABNORMAL HIGH (ref 70–99)

## 2013-09-20 MED ORDER — CLINIMIX E/DEXTROSE (5/15) 5 % IV SOLN
INTRAVENOUS | Status: AC
  Administered 2013-09-20: 18:00:00 via INTRAVENOUS
  Filled 2013-09-20: qty 2000

## 2013-09-20 MED ORDER — POTASSIUM CHLORIDE 10 MEQ/100ML IV SOLN: 10.0000 meq | INTRAVENOUS | Status: DC

## 2013-09-20 MED ORDER — FAT EMULSION 20 % IV EMUL
250.0000 mL | INTRAVENOUS | Status: AC
  Administered 2013-09-20: 250 mL via INTRAVENOUS
  Filled 2013-09-20: qty 250

## 2013-09-20 MED ORDER — MAGNESIUM SULFATE 40 MG/ML IJ SOLN
2.0000 g | Freq: Once | INTRAMUSCULAR | Status: DC
  Filled 2013-09-20: qty 50

## 2013-09-20 MED ORDER — MAGIC MOUTHWASH W/LIDOCAINE
5.0000 mL | Freq: Four times a day (QID) | ORAL | Status: DC | PRN
  Administered 2013-09-20: 5 mL via ORAL
  Filled 2013-09-20 (×2): qty 5

## 2013-09-20 MED ORDER — OSMOLITE 1.5 CAL PO LIQD
1000.0000 mL | ORAL | Status: DC
  Administered 2013-09-20 – 2013-09-21 (×2): 1000 mL
  Filled 2013-09-20 (×4): qty 1000

## 2013-09-20 MED ORDER — POTASSIUM CHLORIDE 10 MEQ/100ML IV SOLN
10.0000 meq | INTRAVENOUS | Status: AC
  Administered 2013-09-20 (×2): 10 meq via INTRAVENOUS
  Filled 2013-09-20 (×2): qty 100

## 2013-09-20 MED ORDER — MAGNESIUM SULFATE 40 MG/ML IJ SOLN
2.0000 g | Freq: Once | INTRAMUSCULAR | Status: AC
  Administered 2013-09-20: 2 g via INTRAVENOUS
  Filled 2013-09-20: qty 50

## 2013-09-20 NOTE — Progress Notes (Signed)
Nutrition Services-Addendum  Intervention: -Initiate Osmolite 1.5 @ 10 ml/hr via GJtube and increase by 10 ml every 8 hours to goal rate of 42 ml/hr.  At goal rate, tube feeding regimen will provide 1500 kcal, 63 grams of protein, and 762 ml of H2O.  -Monitor weight trends and albumin for home nutrition support needs -MD to review gastric motility studies  -TPN per pharmacy   Assessment: 2/20:  -TF were started on pt at 10 ml/hr on 2/19. Per RN documentation, pt began to experience nausea/pain and had one episode emesis.  -Continue with Reglan  -Per discussion with MD, trickle tube feeds are to be restarted today. Will continue to run Clinimix 5/15 at 50 ml/hr w/IVF 20% at 10 ml/hr to provide approximately 50% of estimated nutrition needs per pharmacy. MD requested TPN not to be advanced to goal today  -GI noted diarrhea has improved  -Potassium low  -Phos and Mag WNL  -CBGs: 77-173   2/20: -TF's were re-started today at 10 ml/hr. Were held d/t pt's complaint of nausea/vomiting -Per discussion with pt, pt had not been utilizing Vital AF 1.2 at home for nutrition support.  -Home regimen consisted of nocturnal feeds of Osmolite 1.5 at 40 ml/hr for 14 hours. Would have 1-2 bolus during the day and snack on pretzels/crackers, did not consume whole meals. Pt denied any intolerance except for the vomiting episodes that started on 2/14 -Pt reported usual body weight around 142 lbs. Denied any recent loss or gain. Requested from pt's Nurse Tech for pt to be re-weighed to assess recent weight trends. Will continue to monitor -Had been placed on Vital AF 1.2 for GI tolerance once surgery approved initiation of TFs; however, d/t continued intolerance with Vital AF 1.2,  will trial pt's home TF regimen -Received phone call from [facility] RD to assist with pt's home nutrition needs and insurance coverage -Pt would benefit from having gastric motility study for gastroparesis. Discussed this with  MD, who noted that pt likely has completed this through primary care doctor. Will review results -Pt with low albumin- 2.4. Monitor trends. -MD expressed goal for pt is to be discharged with TF as main source of nutrition. Pharmacy to wean off TPN as tolerated.    Atlee Abide MS RD LDN Clinical Dietitian [id number]

## 2013-09-20 NOTE — Progress Notes (Signed)
PARENTERAL NUTRITION CONSULT NOTE - INITIAL  Pharmacy Consult for TNA Indication: intolerance to enteral feeds  Allergies  Allergen Reactions  . Cefadroxil Other (See Comments)    Not sure of effects-listed as allergy on list from home  . Compazine [Prochlorperazine Edisylate] Hives    Tolerates promethazine  . Darvocet [Propoxyphene N-Acetaminophen] Other (See Comments)    UNKNOWN  . Zofran [Ondansetron Hcl] Hives  . Nsaids Hives  . Penicillins Hives    Patient Measurements: Height: 5\' 4"  (162.6 cm) Weight: 146 lb 11.2 oz (66.543 kg) IBW/kg (Calculated) : 54.7  Vital Signs: Temp: 98.9 F (37.2 C) (02/20 0408) Temp src: Oral (02/20 0408) BP: 149/91 mmHg (02/20 0408) Pulse Rate: 94 (02/20 0408) Intake/Output from previous day: 02/19 0701 - 02/20 0700 In: 2101.7 [I.V.:580; NG/GT:11.7; IV Piggyback:850; TPN:660] Out: 1500 [Urine:1500]  Labs:  Recent Labs  09/18/13 0550  WBC 4.2  HGB 9.3*  HCT 28.2*  PLT 178     Recent Labs  09/18/13 0550 09/19/13 0645 09/20/13 0510  NA 138 139 135*  K 3.2* 3.4* 3.5*  CL 102 103 100  CO2 25 23 24   GLUCOSE 178* 164* 124*  BUN 4* 11 10  CREATININE 0.61 0.64 0.64  CALCIUM 8.6 8.7 8.5  MG 1.2* 1.4* 1.6  PHOS 3.8 4.1  --   PROT 6.6 6.5  --   ALBUMIN 2.4* 2.4*  --   AST 15 13  --   ALT 8 6  --   ALKPHOS 83 80  --   BILITOT <0.2* <0.2*  --   PREALBUMIN 17.7*  --   --   TRIG 170*  --   --    Estimated Creatinine Clearance: 85 ml/min (by C-G formula based on Cr of 0.64).    Recent Labs  09/19/13 1955 09/19/13 2352 09/20/13 0406  GLUCAP 77 121* 121*    CBGs & Insulin requirements past 24 hours:  - CBGs 77 - 173, required 16 units of Novolog sensitive SSI q4h  - Lantus 10 units qhs  Assessment:  59 yoF with h/o pancreatitis, DM with diabetic gastroparesis s/p J-tube placement with multiple issues presented 2/13 with N/V/abd pain. The J-tube was removed and a new G-tube was placed. Patient unable to tolerate  intragastric feedings given N/V/abd pain. Patient also found with C.difficile infection this admission (on Flagyl). Unable to transfer patient to [facility] as MDs there cannot offer more options. GI, surgery, IR on board - plan GJ tube replacement. In the meantime, IR placced PICC and start TNA.   2/20: D#4 TPN, GJ  tube re-placed by IR 2/18.  TF initiated 2/19 but unable to tolerate with increase abd pain, N/V thus TF discontinued  Nutritional Goals:  - RD recs 2/17: 1600-1800 Kcal/day, 80-95 g protein/day, 2.1L fluid per day - Clinimix E 5/15 @ 75 ml/hr + IVF 20% at 10 ml/hr will provide: 90 g protein/day, 1758 Kcal/day, 1.8 L/day  Current nutrition:  - Diet: NPO starting 2/13 - TNA:  started 2/17 PM  - mIVF: NS @ 20 ml/hr  Labs: Electrolytes:  K+ 3.5, Mag 1.6 (improved), corrected Ca = 9.78 Renal Function: Scr wnl/stable, good UOP Hepatic Function: WNL Pre-Albumin: 17.7 (2/18) Triglycerides: 170 (2/18) CBGs: H/o uncontrolled DM on Novolog SSI q4h and Lantus 10 units qhs PTA (both resumed inpatient). Couple of hypoglycemic events few days ago - resolved, CBGs within goal since start of new TPN at 1800 last pm, but TPN rate reduced from 42ml/h to 19ml/hr and TF turned  off d/t intolerance  Plan:  At [place] with Dr. Wyline Copas, will continue TPN at 50 ml/hr for tonight as plan to retry TF today (provides little over 50% needs).  TNA to contain IV fat emulsion 20% at 10 ml/hr daily  Standard multivitamins daily  Due to national backorder, trace elements MWF only  Magnesium sulfate 2g IV x 1   KCl 10 meq IVP x 2 runs  Continue Novolog SSI to resistant scale q4h.  Continue Lantus 10 units qhs   TNA labs Monday/Thursdays  Pharmacy will follow up daily  Doreene Eland, PharmD, BCPS.   Pager: [phone] 09/20/2013 7:33 AM

## 2013-09-20 NOTE — Progress Notes (Signed)
[facility] PROGRESS NOTE  [name] [id number] DOB: 16-Apr-1971 DOA: 09/13/2013 PCP: [name], MD  Assessment/Plan: 1. Enteral feeding. Patient with long-standing history of poorly controlled diabetes mellitus, diabetic gastroparesis, on enteral feeds. Given issues with G-J tube, with multiple G tube and G-J tubes in her past, Gen. surgery was consulted for J-tube placement. [name] felt that J-tube placement was not a good solution, and had multiple associated complications. Interventional radiology was consulted for GJ Tube replacement, s/p tube placement. Plan to resume tube feeds as tolerated. 2. Diabetic gastroparesis. Patient having multiple hospitalizations do to diabetic gastroparesis. She currently sees [name] at [facility]. On scheduled reglan TID. [name] recommended consulting interventional radiology with temporizing GJ tube. He also recommended addressing her pain. With regard to gastric pacemaker implant he stated that this would not help with pain and would need to be addressed in the outpatient setting as this can take months to get approved. [name] did not feel that a transfer was necessary as the GI service there would not have anything to add. Nutrition following to assist with resuming gastric tube feeds with plans to hopefully stop TPN. 3. C diff Colitis. Patient stool, back positive for C. difficile as she was started on Flagyl 500 mg IV q. 8 hours. 4. Type 2 diabetes mellitus, poorly controlled, continue Lantus 10 units subcutaneous daily with Accu-Cheks and sliding scale coverage. Blood sugars appear better controlled today. She is presently n.p.o. 5. Hypertension. Will continue PRN Labetolol and Hydralazine. 6. Hematemesis. Patient having an episode of hematemesis for which GI was consulted. She underwent upper endoscopy which did not reveal active bleeding or significant blood within the stomach. Noted mild to moderate erosive esophagitis  likely related to emesis. Continue PPI therapy.  7. Gastroesophageal reflux disease. PPI therapy 8. DVT prophylaxis. SCDs  Code Status: Full Family Communication: Pt in room (indicate person spoken with, relationship, and if by phone, the number) Disposition Plan: Pending   Consultants:  GI  Surgery  IR  Procedures:  G-J tube replacement by IR 09/18/13  Antibiotics:    HPI/Subjective: Still complains of abd pain. Did not tolerate tube feeds yesterday.  Objective: Filed Vitals:   09/19/13 2151 09/20/13 0408 09/20/13 0500 09/20/13 1418  BP:  149/91  165/101  Pulse:  94  100  Temp:  98.9 F (37.2 C)  100.4 F (38 C)  TempSrc:  Oral  Oral  Resp:  18  20  Height:      Weight:   66.543 kg (146 lb 11.2 oz) 66.55 kg (146 lb 11.5 oz)  SpO2: 100% 100%  100%    Intake/Output Summary (Last 24 hours) at 09/20/13 1558 Last data filed at 09/20/13 1300  Gross per 24 hour  Intake   1376 ml  Output   1900 ml  Net   -524 ml   Filed Weights   09/13/13 1156 09/20/13 0500 09/20/13 1418  Weight: 66.4 kg (146 lb 6.2 oz) 66.543 kg (146 lb 11.2 oz) 66.55 kg (146 lb 11.5 oz)    Exam:   General:  Awake, in nad  Cardiovascular: regular, s1, s2  Respiratory: normal resp effort, no wheezing  Abdomen: soft, nondistended, feeding tube in place  Musculoskeletal: perfused, no clubbing   Data Reviewed: Basic Metabolic Panel:  Recent Labs Lab 09/16/13 0453 09/17/13 0540 09/18/13 0550 09/19/13 0645 09/20/13 0510  NA 138 137 138 139 135*  K 2.9* 3.5* 3.2* 3.4* 3.5*  CL 100 103 102 103 100  CO2 24 23 25 23 24   GLUCOSE 102* 143* 178* 164* 124*  BUN 7 4* 4* 11 10  CREATININE 0.58 0.56 0.61 0.64 0.64  CALCIUM 8.1* 8.1* 8.6 8.7 8.5  MG 1.2* 1.3* 1.2* 1.4* 1.6  PHOS  --   --  3.8 4.1  --    Liver Function Tests:  Recent Labs Lab 09/18/13 0550 09/19/13 0645  AST 15 13  ALT 8 6  ALKPHOS 83 80  BILITOT <0.2* <0.2*  PROT 6.6 6.5  ALBUMIN 2.4* 2.4*   No results  found for this basename: LIPASE, AMYLASE,  in the last 168 hours No results found for this basename: AMMONIA,  in the last 168 hours CBC:  Recent Labs Lab 09/15/13 0545 09/16/13 0453 09/17/13 0540 09/18/13 0550  WBC 7.8 4.2 3.6* 4.2  NEUTROABS  --   --   --  2.2  HGB 9.5* 9.0* 9.7* 9.3*  HCT 28.2* 27.1* 28.9* 28.2*  MCV 86.8 86.6 86.5 87.3  PLT 341 236 212 178   Cardiac Enzymes: No results found for this basename: CKTOTAL, CKMB, CKMBINDEX, TROPONINI,  in the last 168 hours BNP (last 3 results) No results found for this basename: PROBNP,  in the last 8760 hours CBG:  Recent Labs Lab 09/19/13 1955 09/19/13 2352 09/20/13 0406 09/20/13 0741 09/20/13 1214  GLUCAP 77 121* 121* 110* 135*    Recent Results (from the past 240 hour(s))  CLOSTRIDIUM DIFFICILE BY PCR     Status: Abnormal   Collection Time    09/15/13  2:32 PM      Result Value Ref Range Status   C difficile by pcr POSITIVE (*) NEGATIVE Final   Comment: CRITICAL RESULT CALLED TO, READ BACK BY AND VERIFIED WITH:     CRAVEN,M RN 09/15/13 2007 [place]     Performed at [facility]     Studies: Dg Chest Port 1 View  09/19/2013   CLINICAL DATA:  43 year old female with possible foreign body in GI tract. Initial encounter.  EXAM: PORTABLE CHEST - 1 VIEW  COMPARISON:  09/13/2013.  FINDINGS: Partially visible percutaneous gastrostomy tube hardware, but catheter tubing no longer visible in the distal thoracic esophagus.  Interval placement right PICC line.  Lung volumes stable and within normal limits. Allowing for portable technique, the lungs are clear. Normal cardiac size and mediastinal contours. There is increased gas in the cervical and upper thoracic esophagus.  IMPRESSION: 1. Catheter tubing no longer visible in the distal thoracic esophagus. 2.  No acute cardiopulmonary abnormality.   Electronically Signed   By: Lars Pinks M.D.   On: 09/19/2013 20:06    Scheduled Meds: . famotidine (PEPCID) IV  20 mg  Intravenous Q12H  . feeding supplement (OSMOLITE 1.5 CAL)  1,000 mL Per Tube Q24H  . insulin aspart  0-20 Units Subcutaneous 6 times per day  . insulin glargine  10 Units Subcutaneous QHS  . metoCLOPramide (REGLAN) injection  10 mg Intravenous 4 times per day  . metoprolol  10 mg Intravenous Once  . metronidazole  500 mg Intravenous Q8H  . mometasone-formoterol  2 puff Inhalation BID  . sodium chloride  3 mL Intravenous Q12H   Continuous Infusions: . sodium chloride 20 mL/hr at 09/18/13 1700  . Marland KitchenTPN (CLINIMIX-E) Adult 50 mL/hr at 09/19/13 1717   And  . fat emulsion 250 mL (09/19/13 1717)  . Marland KitchenTPN (CLINIMIX-E) Adult     And  . fat emulsion      Active Problems:  Intractable nausea and vomiting   Dyslipidemia   GERD (gastroesophageal reflux disease)   Hypertension   DM (diabetes mellitus), type 2, uncontrolled   Dehydration   Hyponatremia   Leukocytosis, unspecified   Abdominal pain   Diabetic neuropathy   Hyperglycemia   Hypochloremia   Hemoptysis   Diabetic gastroparesis  Time spent: 29min  Doneshia Hill, [facility] Pager [phone]. If 7PM-7AM, please contact night-coverage at [email or web address], password [facility] 09/20/2013, 3:58 PM  LOS: 7 days

## 2013-09-20 NOTE — Progress Notes (Signed)
NUTRITION FOLLOW UP  Intervention:   -Re-Initiate Vital AF 1.2 @ 10 ml/hr via GJtube and increase by 10 ml every 8 hours to goal rate of 42 ml/hr (1 L./day). At goal rate, tube feeding regimen will provide 1200 kcal (75% est kcal needs), 75 grams of protein (94% est protein needs), and 811 ml of H2O.  -Continue with Reglan -TPN per pharmacy -Will continue to monitor  Nutrition Dx:   Inadequate oral intake related to inability to eat as evidenced by NPO status-ongoing   Goal:   TF and TPN to meet >/= 90% of their estimated nutrition needs    Monitor:   TF order/tolerance, TPN tolerance, total protein/energy intake, GI profile, labs, weights    Assessment:   2/13: -Pt with hx of TF intolerance d/t nausea/vomiting and refusing bolus tube feeding. Per previous medical records, pt may be more willing to try continuous feedings over home bolus regimen  -Pt was receiving Vital 1.2 AF at 237 ml Q3H. 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 Will place recommendations to implement as warranted  -Sees gastroparesis specialist at [facility]  -Usual weight of 149 lbs per previous RD assessment  -Drinks Glucerna shakes occasionally. Allow as tolerated   2/16:  -Pt continues to experience TF intolerance TF was held after pt with nausea/vomiting after initiating Vital AF 1.2 at 20 ml/hr.  -Pt may undergo replacement G-J tube  -Pt also may be transferred to [facility] for further GI evaluation per discussion with RN. No plans to restart TF today  -Pt at risk for refeeding d/t prolonged period of suboptimal nutrition. Mag and K low. Recommend refeeding labs be run if TF initiated and opt for conservative advancemet    2/17: -Discussed TPN with pharmacy. Monitor refeeding labs and recommend conservative advancement  -See nutrition follow up note completed on 2/16 for additional information  -Will continue to monitor  2/19: -Per discussion with pharmacy and MD, plan to initiate TF today as pt s/p GJ tube replacement -Will recommend conservative initiation and advancement d/t pt's hx of TF tolerance and prolonged period of sub-optimal nutrition -GI recommended "gatorade feeds" with goal of 1L/day. Will modify previous TF goal rate to meet GI recommendations. -Vital AF 1.2 at 42 ml/hr will provide 1200 kcal (75% est kcal needs), and 75 gram protein (94% est protein needs) -Pharmacy did not advance TPN d/t start of TF. Recommended Clinimix 5/15 at 50 ml/hr with fat emulsion 20% of 10 ml/hr to provide approximately 50% of estimated nutrition needs   2/20: -TF were started on pt at 10 ml/hr on 2/19. Per RN documentation, pt began to experience nausea/pain and had one episode emesis.  -Continue with Reglan -Per discussion with MD, trickle tube feeds are to be restarted today. Will continue to run Clinimix 5/15 at 50 ml/hr w/IVF 20% at 10 ml/hr to provide approximately 50% of estimated nutrition needs per pharmacy. MD requested TPN not to be advanced to goal today -GI noted diarrhea has improved -Potassium low -Phos and Mag WNL -CBGs: 77-173   Height: Ht Readings from Last 1 Encounters:  09/13/13 5\' 4"  (1.626 m)    Weight Status:   Wt Readings from Last 1 Encounters:  09/20/13 146 lb 11.2 oz (  66.543 kg)    Re-estimated needs:  Kcal: 1600-1800  Protein: 80-95 gram  Fluid: 2100 ml//daily   Skin: WDL  Diet Order: NPO   Intake/Output Summary (Last 24 hours) at 09/20/13 1039 Last data filed at 09/20/13 0600  Gross per 24 hour  Intake 2101.67 ml  Output    600 ml  Net 1501.67 ml    Last BM: 2/19   Labs:   Recent Labs Lab 09/18/13 0550 09/19/13 0645 09/20/13 0510  NA 138 139 135*   K 3.2* 3.4* 3.5*  CL 102 103 100  CO2 25 23 24   BUN 4* 11 10  CREATININE 0.61 0.64 0.64  CALCIUM 8.6 8.7 8.5  MG 1.2* 1.4* 1.6  PHOS 3.8 4.1  --   GLUCOSE 178* 164* 124*    CBG (last 3)   Recent Labs  09/19/13 2352 09/20/13 0406 09/20/13 0741  GLUCAP 121* 121* 110*    Scheduled Meds: . famotidine (PEPCID) IV  20 mg Intravenous Q12H  . feeding supplement (VITAL AF 1.2 CAL)  1,000 mL Per Tube Q24H  . insulin aspart  0-20 Units Subcutaneous 6 times per day  . insulin glargine  10 Units Subcutaneous QHS  . metoCLOPramide (REGLAN) injection  10 mg Intravenous 4 times per day  . metoprolol  10 mg Intravenous Once  . metronidazole  500 mg Intravenous Q8H  . mometasone-formoterol  2 puff Inhalation BID  . potassium chloride  10 mEq Intravenous Q1 Hr x 2  . sodium chloride  3 mL Intravenous Q12H    Continuous Infusions: . sodium chloride 20 mL/hr at 09/18/13 1700  . Marland KitchenTPN (CLINIMIX-E) Adult 50 mL/hr at 09/19/13 1717   And  . fat emulsion 250 mL (09/19/13 1717)    Atlee Abide MS RD LDN Clinical Dietitian [id number]

## 2013-09-20 NOTE — Progress Notes (Signed)
[facility] Progress Note  Subjective: States she vomited some yesterday when tube feeds were reattempted, cannot confirm this. Overall states her abdominal pain and diarrhea are improved particularly the diarrhea.  Objective: Vital signs in last 24 hours: Temp:  [98.9 F (37.2 C)-99.7 F (37.6 C)] 98.9 F (37.2 C) (02/20 0408) Pulse Rate:  [94-109] 94 (02/20 0408) Resp:  [18] 18 (02/20 0408) BP: (149-163)/(91-96) 149/91 mmHg (02/20 0408) SpO2:  [100 %] 100 % (02/20 0408) Weight:  [66.543 kg (146 lb 11.2 oz)] 66.543 kg (146 lb 11.2 oz) (02/20 0500) Weight change:    PE: Abdomen soft, diffusely mildly tender without focality  Lab Results: Results for orders placed during the hospital encounter of 09/13/13 (from the past 24 hour(s))  GLUCOSE, CAPILLARY     Status: Abnormal   Collection Time    09/19/13 12:00 PM      Result Value Ref Range   Glucose-Capillary 111 (*) 70 - 99 mg/dL  GLUCOSE, CAPILLARY     Status: Abnormal   Collection Time    09/19/13  5:06 PM      Result Value Ref Range   Glucose-Capillary 173 (*) 70 - 99 mg/dL  GLUCOSE, CAPILLARY     Status: None   Collection Time    09/19/13  7:55 PM      Result Value Ref Range   Glucose-Capillary 77  70 - 99 mg/dL  GLUCOSE, CAPILLARY     Status: Abnormal   Collection Time    09/19/13 11:52 PM      Result Value Ref Range   Glucose-Capillary 121 (*) 70 - 99 mg/dL  GLUCOSE, CAPILLARY     Status: Abnormal   Collection Time    09/20/13  4:06 AM      Result Value Ref Range   Glucose-Capillary 121 (*) 70 - 99 mg/dL  BASIC METABOLIC PANEL     Status: Abnormal   Collection Time    09/20/13  5:10 AM      Result Value Ref Range   Sodium 135 (*) 137 - 147 mEq/L   Potassium 3.5 (*) 3.7 - 5.3 mEq/L   Chloride 100  96 - 112 mEq/L   CO2 24  19 - 32 mEq/L   Glucose, Bld 124 (*) 70 - 99 mg/dL   BUN 10  6 - 23 mg/dL   Creatinine, Ser 0.64  0.50 - 1.10 mg/dL   Calcium 8.5  8.4 - 10.5 mg/dL   GFR calc non Af Amer >90   >90 mL/min   GFR calc Af Amer >90  >90 mL/min  MAGNESIUM     Status: None   Collection Time    09/20/13  5:10 AM      Result Value Ref Range   Magnesium 1.6  1.5 - 2.5 mg/dL  GLUCOSE, CAPILLARY     Status: Abnormal   Collection Time    09/20/13  7:41 AM      Result Value Ref Range   Glucose-Capillary 110 (*) 70 - 99 mg/dL    Studies/Results: Ir Gj Tube Change  09/18/2013   CLINICAL DATA:  43 year old female with a significant history of gastroparesis with chronic nausea and vomiting. She has a percutaneous gastrojejunostomy tube which she frequently regurgitates from its normal position in the proximal small bowel into her esophagus requiring manipulation under fluoroscopy to repositioned the tube in the proximal small bowel. This has happened on a near monthly basis over the past several months.  She presents today for repositioning of  the gastrojejunostomy tube prior to discharge. She will then follow-up with her gastroenterologist at [facility] for additional medical management of her chronic nausea in an effort to reduce vomiting and displacement of the tube from the proximal small bowel into the esophagus.  EXAM: JEJUNAL CATHETER REPLACEMENT  Date: 09/18/2013  TECHNIQUE: Informed consent was obtained from the patient following explanation of the procedure, risks, benefits and alternatives. The patient understands, agrees and consents for the procedure. All questions were addressed. A time out was performed.  Maximal barrier sterile technique utilized including caps, mask, sterile gowns, sterile gloves, large sterile drape, hand hygiene, and Betadine skin prep.  The balloon retention device of the existing gastrostomy tube was deflated the tube removed. A new [employer] 40 French gastrostomy tube was then advanced through the mature stoma and into the stomach. The balloon was filled with 8 mL of dilute contrast material and pulled snug against the anterior  abdominal wall. An angled catheter and Glidewire were then manipulated through the catheter and used to catheterize the distended duodenum. The catheter and Glidewire were ultimately advanced into the proximal jejunum. The Glidewire was then exchanged for an Amplatz wire. A new jejunal limb was then advanced over the wire and positioned with the tip in the distal horizontal duodenum at the junction of the third and fourth portions of the duodenum. Position was confirmed by gently hand injected contrast material and a spot fluoroscopic image was obtained. Due to the chronic distension and redundancy of the duodenum, this represents the maximum depth this tube can be advanced.  ANESTHESIA/SEDATION: None required  CONTRAST:  26mL OMNIPAQUE IOHEXOL 300 MG/ML  SOLN  FLUOROSCOPY TIME:  6 min 54 seconds  PROCEDURE: 1. Up size of existing 18 French gastrostomy tube to a 20 [place] gastrostomy tube 2. Placement of jejunal tube coaxially through the gastrostomy tube Interventional Radiologist:  Criselda Peaches, MD  IMPRESSION: 1. Up size of existing 18 French percutaneous gastrostomy tube for a new 41 French balloon retention gastrostomy tube. 2. Successful placement of a J arm extension. The tip of the J is at the junction of the third and fourth portions of the duodenum. More distal placement of the J arm would require special ordering of a longer device secondary to the chronic dilatation and patulous nature of the duodenum. Patient will follow-up with her gastroenterologist for medical management of her chronic nausea and emesis. If her emesis cannot be controlled and she continues to regurgitate the tube into her esophagus, we could consider ordering in an extra length device (if one is commercially available) to advance the tip farther out into the jejunum.  Signed,  Criselda Peaches, MD  [facility]  [facility]   Electronically Signed   By: Jacqulynn Cadet M.D.    On: 09/18/2013 14:39   Dg Chest Port 1 View  09/19/2013   CLINICAL DATA:  43 year old female with possible foreign body in GI tract. Initial encounter.  EXAM: PORTABLE CHEST - 1 VIEW  COMPARISON:  09/13/2013.  FINDINGS: Partially visible percutaneous gastrostomy tube hardware, but catheter tubing no longer visible in the distal thoracic esophagus.  Interval placement right PICC line.  Lung volumes stable and within normal limits. Allowing for portable technique, the lungs are clear. Normal cardiac size and mediastinal contours. There is increased gas in the cervical and upper thoracic esophagus.  IMPRESSION: 1. Catheter tubing no longer visible in the distal thoracic esophagus. 2.  No acute cardiopulmonary abnormality.  Electronically Signed   By: Lars Pinks M.D.   On: 09/19/2013 20:06      Assessment: 1. Diabetic gastroparesis refractory to medical management 2. C. difficile diarrhea 3. Chronic abdominal pain 4. Malnutrition  Plan: 1. Continue TPN for now, unclear what in point is for stopping it. 2. Possibly reconsider interventional radiology of placing a jejunal tube through gastrostomy tube after further nutrition and treatment for C. difficile diarrhea. 3. Continue Flagyl  4. Continue IV Reglan 5. Trial of trickle tube feeds intermittently through her gastrostomy tube which I suspect she will not tolerate. Referral back to Dr.Ken Derrill Kay at [facility] post discharge for consideration of gastric pacing   Naoma Boxell C 09/20/2013, 9:12 AM

## 2013-09-20 NOTE — Progress Notes (Signed)
Trialed tube feeding today at 47ml/hr; pt could not tolerate, complaining of abdominal pain and cramping; did stop and will retry later; MD aware

## 2013-09-20 NOTE — Progress Notes (Signed)
7 Days Post-Op  Subjective: She feels febrile right now, TF off because she was intolerant yesterday.  Less diarrhea.  Objective: Vital signs in last 24 hours: Temp:  [98.9 F (37.2 C)-99.7 F (37.6 C)] 98.9 F (37.2 C) (02/20 0408) Pulse Rate:  [94-109] 94 (02/20 0408) Resp:  [18] 18 (02/20 0408) BP: (149-163)/(91-96) 149/91 mmHg (02/20 0408) SpO2:  [100 %] 100 % (02/20 0408) Weight:  [66.543 kg (146 lb 11.2 oz)] 66.543 kg (146 lb 11.2 oz) (02/20 0500) Last BM Date: 09/19/13 PO not recorded No Bm today, 1 recorded for yesterday. Afebrile, VSS, BP up Na and K+ is low Intake/Output from previous day: 02/19 0701 - 02/20 0700 In: 2101.7 [I.V.:580; NG/GT:11.7; IV Piggyback:850; TPN:660] Out: 1500 [Urine:1500] Intake/Output this shift: Total I/O In: -  Out: 600 [Urine:600]  General appearance: alert, cooperative and no distress GI: soft, non-tender; bowel sounds normal; no masses,  no organomegaly and she has multiple incisions, including 2 prior feeding tube sites, a midline incision she thinks from Gallbladder and bowel resection.  Lab Results:   Recent Labs  09/18/13 0550  WBC 4.2  HGB 9.3*  HCT 28.2*  PLT 178    BMET  Recent Labs  09/19/13 0645 09/20/13 0510  NA 139 135*  K 3.4* 3.5*  CL 103 100  CO2 23 24  GLUCOSE 164* 124*  BUN 11 10  CREATININE 0.64 0.64  CALCIUM 8.7 8.5   PT/INR No results found for this basename: LABPROT, INR,  in the last 72 hours   Recent Labs Lab 09/18/13 0550 09/19/13 0645  AST 15 13  ALT 8 6  ALKPHOS 83 80  BILITOT <0.2* <0.2*  PROT 6.6 6.5  ALBUMIN 2.4* 2.4*     Lipase     Component Value Date/Time   LIPASE 31 09/13/2013 0805     Studies/Results: Ir Gj Tube Change  09/18/2013   CLINICAL DATA:  43 year old female with a significant history of gastroparesis with chronic nausea and vomiting. She has a percutaneous gastrojejunostomy tube which she frequently regurgitates from its normal position in the proximal  small bowel into her esophagus requiring manipulation under fluoroscopy to repositioned the tube in the proximal small bowel. This has happened on a near monthly basis over the past several months.  She presents today for repositioning of the gastrojejunostomy tube prior to discharge. She will then follow-up with her gastroenterologist at [facility] for additional medical management of her chronic nausea in an effort to reduce vomiting and displacement of the tube from the proximal small bowel into the esophagus.  EXAM: JEJUNAL CATHETER REPLACEMENT  Date: 09/18/2013  TECHNIQUE: Informed consent was obtained from the patient following explanation of the procedure, risks, benefits and alternatives. The patient understands, agrees and consents for the procedure. All questions were addressed. A time out was performed.  Maximal barrier sterile technique utilized including caps, mask, sterile gowns, sterile gloves, large sterile drape, hand hygiene, and Betadine skin prep.  The balloon retention device of the existing gastrostomy tube was deflated the tube removed. A new [employer] 54 French gastrostomy tube was then advanced through the mature stoma and into the stomach. The balloon was filled with 8 mL of dilute contrast material and pulled snug against the anterior abdominal wall. An angled catheter and Glidewire were then manipulated through the catheter and used to catheterize the distended duodenum. The catheter and Glidewire were ultimately advanced into the proximal jejunum. The Glidewire was then exchanged for an Amplatz wire.  A new jejunal limb was then advanced over the wire and positioned with the tip in the distal horizontal duodenum at the junction of the third and fourth portions of the duodenum. Position was confirmed by gently hand injected contrast material and a spot fluoroscopic image was obtained. Due to the chronic distension and redundancy of the duodenum, this  represents the maximum depth this tube can be advanced.  ANESTHESIA/SEDATION: None required  CONTRAST:  53mL OMNIPAQUE IOHEXOL 300 MG/ML  SOLN  FLUOROSCOPY TIME:  6 min 54 seconds  PROCEDURE: 1. Up size of existing 18 French gastrostomy tube to a 20 [place] gastrostomy tube 2. Placement of jejunal tube coaxially through the gastrostomy tube Interventional Radiologist:  Criselda Peaches, MD  IMPRESSION: 1. Up size of existing 18 French percutaneous gastrostomy tube for a new 14 French balloon retention gastrostomy tube. 2. Successful placement of a J arm extension. The tip of the J is at the junction of the third and fourth portions of the duodenum. More distal placement of the J arm would require special ordering of a longer device secondary to the chronic dilatation and patulous nature of the duodenum. Patient will follow-up with her gastroenterologist for medical management of her chronic nausea and emesis. If her emesis cannot be controlled and she continues to regurgitate the tube into her esophagus, we could consider ordering in an extra length device (if one is commercially available) to advance the tip farther out into the jejunum.  Signed,  Criselda Peaches, MD  [facility]  [facility]   Electronically Signed   By: Jacqulynn Cadet M.D.   On: 09/18/2013 14:39   Dg Chest Port 1 View  09/19/2013   CLINICAL DATA:  43 year old female with possible foreign body in GI tract. Initial encounter.  EXAM: PORTABLE CHEST - 1 VIEW  COMPARISON:  09/13/2013.  FINDINGS: Partially visible percutaneous gastrostomy tube hardware, but catheter tubing no longer visible in the distal thoracic esophagus.  Interval placement right PICC line.  Lung volumes stable and within normal limits. Allowing for portable technique, the lungs are clear. Normal cardiac size and mediastinal contours. There is increased gas in the cervical and upper thoracic esophagus.  IMPRESSION: 1. Catheter  tubing no longer visible in the distal thoracic esophagus. 2.  No acute cardiopulmonary abnormality.   Electronically Signed   By: Lars Pinks M.D.   On: 09/19/2013 20:06    Medications: . famotidine (PEPCID) IV  20 mg Intravenous Q12H  . feeding supplement (VITAL AF 1.2 CAL)  1,000 mL Per Tube Q24H  . insulin aspart  0-20 Units Subcutaneous 6 times per day  . insulin glargine  10 Units Subcutaneous QHS  . metoCLOPramide (REGLAN) injection  10 mg Intravenous 4 times per day  . metoprolol  10 mg Intravenous Once  . metronidazole  500 mg Intravenous Q8H  . mometasone-formoterol  2 puff Inhalation BID  . sodium chloride  3 mL Intravenous Q12H    Assessment/Plan Gastroparesis/underlying GI motility disorder/Has been followed by [name] at [facility]  Gastrostomy tube was pulled out, IR successfully replaced it.  Malnutrition - Plan TPN  C. Diff - on Flagyl - 09/16/2013 >>>  Diabetes on disability for this for years.   Plan:  No current surgery planned.  If she need something it should be done in [facility] where [name] can assist with her care.  Call us if we can help.  LOS: 7 days    JENNINGS,WILLARD 09/20/2013  Agree with above. Any surgical issues should be handled through [facility] and her treating gastroenterologist, [name].  Alphonsa Overall, MD, [facility] Pager: [phone] Office phone:  [phone]

## 2013-09-21 ENCOUNTER — Inpatient Hospital Stay (HOSPITAL_COMMUNITY): Payer: Medicare Other

## 2013-09-21 DIAGNOSIS — N179 Acute kidney failure, unspecified: Secondary | ICD-10-CM

## 2013-09-21 LAB — BASIC METABOLIC PANEL
BUN: 12 mg/dL (ref 6–23)
CALCIUM: 8.4 mg/dL (ref 8.4–10.5)
CO2: 23 mEq/L (ref 19–32)
Chloride: 99 mEq/L (ref 96–112)
Creatinine, Ser: 0.64 mg/dL (ref 0.50–1.10)
GFR calc Af Amer: >90 mL/min (ref >90)
GFR calc non Af Amer: >90 mL/min (ref >90)
GLUCOSE: 153 mg/dL — AB (ref 70–99)
Potassium: 3.5 mEq/L — ABNORMAL LOW (ref 3.7–5.3)
SODIUM: 134 meq/L — AB (ref 137–147)

## 2013-09-21 LAB — GLUCOSE, CAPILLARY
GLUCOSE-CAPILLARY: 161 mg/dL — AB (ref 70–99)
GLUCOSE-CAPILLARY: 140 mg/dL — AB (ref 70–99)
GLUCOSE-CAPILLARY: 261 mg/dL — AB (ref 70–99)
Glucose-Capillary: 191 mg/dL — ABNORMAL HIGH (ref 70–99)
Glucose-Capillary: 79 mg/dL (ref 70–99)

## 2013-09-21 MED ORDER — POTASSIUM CHLORIDE 20 MEQ/15ML (10%) PO LIQD
40.0000 meq | Freq: Once | ORAL | Status: AC
  Administered 2013-09-21: 40 meq
  Filled 2013-09-21: qty 30

## 2013-09-21 MED ORDER — FAT EMULSION 20 % IV EMUL
250.0000 mL | INTRAVENOUS | Status: AC
  Administered 2013-09-21: 250 mL via INTRAVENOUS
  Filled 2013-09-21: qty 250

## 2013-09-21 MED ORDER — HYDROMORPHONE HCL PF 1 MG/ML IJ SOLN
1.0000 mg | INTRAMUSCULAR | Status: DC | PRN
  Administered 2013-09-21 – 2013-09-25 (×21): 1 mg via INTRAVENOUS
  Filled 2013-09-21 (×21): qty 1

## 2013-09-21 MED ORDER — SUCRALFATE 1 GM/10ML PO SUSP
1.0000 g | Freq: Three times a day (TID) | ORAL | Status: DC
  Administered 2013-09-21 – 2013-09-23 (×7): 1 g
  Filled 2013-09-21 (×19): qty 10

## 2013-09-21 MED ORDER — ACETAMINOPHEN 160 MG/5ML PO SOLN
650.0000 mg | Freq: Three times a day (TID) | ORAL | Status: DC
  Administered 2013-09-21 – 2013-09-23 (×6): 650 mg
  Filled 2013-09-21 (×6): qty 20.3

## 2013-09-21 MED ORDER — M.V.I. ADULT IV INJ
INTRAVENOUS | Status: AC
  Administered 2013-09-21: 18:00:00 via INTRAVENOUS
  Filled 2013-09-21: qty 2000

## 2013-09-21 MED ORDER — GABAPENTIN 250 MG/5ML PO SOLN
600.0000 mg | Freq: Three times a day (TID) | ORAL | Status: DC
  Administered 2013-09-21 – 2013-09-23 (×7): 600 mg
  Filled 2013-09-21 (×15): qty 12

## 2013-09-21 MED ORDER — POTASSIUM CHLORIDE 10 MEQ/100ML IV SOLN
10.0000 meq | INTRAVENOUS | Status: DC
  Filled 2013-09-21 (×4): qty 100

## 2013-09-21 MED ORDER — PROMETHAZINE HCL 25 MG PO TABS
25.0000 mg | ORAL_TABLET | Freq: Four times a day (QID) | ORAL | Status: DC | PRN
  Administered 2013-09-21: 25 mg
  Filled 2013-09-21: qty 1

## 2013-09-21 MED ORDER — BUSPIRONE HCL 5 MG PO TABS
5.0000 mg | ORAL_TABLET | Freq: Three times a day (TID) | ORAL | Status: DC
  Administered 2013-09-21 – 2013-09-23 (×6): 5 mg
  Filled 2013-09-21 (×14): qty 1

## 2013-09-21 MED ORDER — HYDROMORPHONE HCL PF 1 MG/ML IJ SOLN
INTRAMUSCULAR | Status: AC
  Administered 2013-09-21: 1 mg
  Filled 2013-09-21: qty 1

## 2013-09-21 MED ORDER — OXYCODONE HCL 5 MG PO TABS
5.0000 mg | ORAL_TABLET | ORAL | Status: DC | PRN
  Administered 2013-09-21: 5 mg via ORAL
  Filled 2013-09-21: qty 1

## 2013-09-21 MED ORDER — ATORVASTATIN CALCIUM 40 MG PO TABS
40.0000 mg | ORAL_TABLET | Freq: Every day | ORAL | Status: DC
  Administered 2013-09-21 – 2013-09-22 (×2): 40 mg
  Filled 2013-09-21 (×5): qty 1

## 2013-09-21 MED ORDER — SODIUM CHLORIDE 0.9 % IV SOLN
25.0000 mg | Freq: Four times a day (QID) | INTRAVENOUS | Status: DC | PRN
  Administered 2013-09-21 – 2013-09-25 (×13): 25 mg via INTRAVENOUS
  Filled 2013-09-21 (×14): qty 1

## 2013-09-21 NOTE — Progress Notes (Signed)
Per [facility] [employer] Representative, patient has not met qualifications for infusion services. She is unable to receive the medications she needs to go home tomorrow per [place]. [facility] [employer] rep will call and follow up with Care Management in the morning should she go home. Thanks Nursing

## 2013-09-21 NOTE — Progress Notes (Signed)
PARENTERAL NUTRITION CONSULT NOTE - INITIAL  Pharmacy Consult for TNA Indication: intolerance to enteral feeds  Allergies  Allergen Reactions  . Cefadroxil Other (See Comments)    Not sure of effects-listed as allergy on list from home  . Compazine [Prochlorperazine Edisylate] Hives    Tolerates promethazine  . Darvocet [Propoxyphene N-Acetaminophen] Other (See Comments)    UNKNOWN  . Zofran [Ondansetron Hcl] Hives  . Nsaids Hives  . Penicillins Hives   Patient Measurements: Height: 5\' 4"  (162.6 cm) Weight: 146 lb 12.2 oz (66.57 kg) IBW/kg (Calculated) : 54.7  Vital Signs: Temp: 98.4 F (36.9 C) (02/21 0410) Temp src: Oral (02/21 0410) BP: 123/79 mmHg (02/21 0410) Pulse Rate: 85 (02/21 0410) Intake/Output from previous day: 02/20 0701 - 02/21 0700 In: 3099.2 [I.V.:2133.3; NG/GT:15.8; IV Piggyback:350; TPN:600] Out: 2100 [Urine:2100]  Labs: No results found for this basename: WBC, HGB, HCT, PLT, APTT, INR,  in the last 72 hours   Recent Labs  09/19/13 0645 09/20/13 0510 09/21/13 0331  NA 139 135* 134*  K 3.4* 3.5* 3.5*  CL 103 100 99  CO2 23 24 23   GLUCOSE 164* 124* 153*  BUN 11 10 12   CREATININE 0.64 0.64 0.64  CALCIUM 8.7 8.5 8.4  MG 1.4* 1.6  --   PHOS 4.1  --   --   PROT 6.5  --   --   ALBUMIN 2.4*  --   --   AST 13  --   --   ALT 6  --   --   ALKPHOS 80  --   --   BILITOT <0.2*  --   --    Estimated Creatinine Clearance: 85.2 ml/min (by C-G formula based on Cr of 0.64).    Recent Labs  09/20/13 2004 09/20/13 2352 09/21/13 0743  GLUCAP 118* 156* 161*    CBGs & Insulin requirements past 24 hours:  - CBGs 110-156/161, required 20/4 units of Novolog sensitive SSI q4h  - Lantus 10 units qhs  Assessment:  67 yoF with h/o pancreatitis, DM with diabetic gastroparesis s/p J-tube placement with multiple issues presented 2/13 with N/V/abd pain. The J-tube was removed and a new G-tube was placed. Patient unable to tolerate intragastric feedings given  N/V/abd pain. Patient also found with C.difficile infection this admission (on Flagyl). Unable to transfer patient to [facility] as MDs there cannot offer more options. GI, surgery, IR on board - plan GJ tube replacement. In the meantime, IR placced PICC and start TNA.   2/21: D#5 TPN, GJ  tube re-placed by IR 2/18.  TF attempted resume 2/20 but unable to tolerate with increase abd pain.  Nutritional Goals:  - RD recs 2/17: 1600-1800 Kcal/day, 80-95 g protein/day, 2.1L fluid per day - Clinimix E 5/15 @ 75 ml/hr + IVF 20% at 10 ml/hr will provide: 90 g protein/day, 1758 Kcal/day, 1.8 L/day  Current nutrition:  - Diet: NPO starting 2/13 - TNA:  started 2/17 PM  - mIVF: NS @ 20 ml/hr  Labs: Electrolytes:  K+ 3.5, Mag 1.6 (improved), corrected Ca = 9.2 Renal Function: Scr wnl/stable, good UOP Hepatic Function: WNL Pre-Albumin: 17.7 (2/18) Triglycerides: 170 (2/18) CBGs: H/o uncontrolled DM on Novolog SSI q4h and Lantus 10 units qhs PTA (both resumed inpatient). Couple of hypoglycemic events few days ago - resolved  Plan:  At 1800 tonight  Will continue TPN at 50 ml/hr for tonight as plan to retry tube feed (again)    IV fat emulsion 20% at 10 ml/hr daily,  Standard multivitamins daily  Due to national backorder, trace elements MWF only  KCl 10 meq IVP x 4 runs, BMET in am  Continue Novolog SSI to resistant scale q4h.  Continue Lantus 10 units qhs   TNA labs Monday/Thursdays  Minda Ditto PharmD Pager [phone] 09/21/2013, 2:24 PM

## 2013-09-21 NOTE — Progress Notes (Signed)
[facility] PROGRESS NOTE  LAQUANTA DIMANCHE [id number] DOB: Feb 14, 1971 DOA: 09/13/2013 PCP: [name], MD  Assessment/Plan: 1. Enteral feeding. Patient with long-standing history of poorly controlled diabetes mellitus, diabetic gastroparesis, on enteral feeds. Given issues with G-J tube, with multiple G tube and G-J tubes in her past, Gen. surgery was consulted for J-tube placement. [name] felt that J-tube placement was not a good solution, and had multiple associated complications. Interventional radiology was consulted for GJ Tube replacement, s/p tube placement. Attempting to resume tube feeds as tolerated, however pt does not seem to tolerate them well. 2. Diabetic gastroparesis. Patient having multiple hospitalizations do to diabetic gastroparesis. She currently sees [name] at [facility]. On scheduled reglan TID. [name] recommended consulting interventional radiology with temporizing GJ tube. He also recommended addressing her pain. With regard to gastric pacemaker implant he stated that this would not help with pain and would need to be addressed in the outpatient setting as this can take months to get approved. [name] did not feel that a transfer was necessary as the GI service there would not have anything to add. Nutrition following to assist with resuming gastric tube feeds with plans to hopefully stop TPN. 3. C diff Colitis. Patient stool, back positive for C. difficile as she was started on Flagyl 500 mg IV q. 8 hours. (started 2/18) 4. Type 2 diabetes mellitus, poorly controlled, continue Lantus 10 units subcutaneous daily with Accu-Cheks and sliding scale coverage. Blood sugars appear better controlled today. She is presently n.p.o. 5. Hypertension. Will continue PRN Labetolol and Hydralazine. 6. Hematemesis. Patient having an episode of hematemesis for which GI was consulted. She underwent upper endoscopy which did not reveal active bleeding or significant blood  within the stomach. Noted mild to moderate erosive esophagitis likely related to emesis. Continue PPI therapy.  7. Gastroesophageal reflux disease. PPI therapy 8. DVT prophylaxis. SCDs  Code Status: Full Family Communication: Pt in room (indicate person spoken with, relationship, and if by phone, the number) Disposition Plan: Pending   Consultants:  GI  Surgery  IR  Procedures:  G-J tube replacement by IR 09/18/13  Antibiotics:  Flagyl 09/18/13>>>  HPI/Subjective: Still complains of abd pain. Did not tolerate tube feeds yesterday.  Objective: Filed Vitals:   09/20/13 2043 09/20/13 2120 09/20/13 2203 09/21/13 0410  BP: 138/71   123/79  Pulse: 98   85  Temp: 100.5 F (38.1 C)  99.7 F (37.6 C) 98.4 F (36.9 C)  TempSrc: Oral  Oral Oral  Resp: 20   19  Height:      Weight:    66.57 kg (146 lb 12.2 oz)  SpO2: 100% 97%  98%    Intake/Output Summary (Last 24 hours) at 09/21/13 0907 Last data filed at 09/21/13 0655  Gross per 24 hour  Intake 3099.16 ml  Output   1500 ml  Net 1599.16 ml   Filed Weights   09/20/13 0500 09/20/13 1418 09/21/13 0410  Weight: 66.543 kg (146 lb 11.2 oz) 66.55 kg (146 lb 11.5 oz) 66.57 kg (146 lb 12.2 oz)    Exam:   General:  Awake, in nad  Cardiovascular: regular, s1, s2  Respiratory: normal resp effort, no wheezing  Abdomen: soft, nondistended, feeding tube in place  Musculoskeletal: perfused, no clubbing   Data Reviewed: Basic Metabolic Panel:  Recent Labs Lab 09/16/13 0453 09/17/13 0540 09/18/13 0550 09/19/13 0645 09/20/13 0510 09/21/13 0331  NA 138 137 138 139 135* 134*  K 2.9* 3.5* 3.2* 3.4*  3.5* 3.5*  CL 100 103 102 103 100 99  CO2 24 23 25 23 24 23   GLUCOSE 102* 143* 178* 164* 124* 153*  BUN 7 4* 4* 11 10 12   CREATININE 0.58 0.56 0.61 0.64 0.64 0.64  CALCIUM 8.1* 8.1* 8.6 8.7 8.5 8.4  MG 1.2* 1.3* 1.2* 1.4* 1.6  --   PHOS  --   --  3.8 4.1  --   --    Liver Function Tests:  Recent Labs Lab  09/18/13 0550 09/19/13 0645  AST 15 13  ALT 8 6  ALKPHOS 83 80  BILITOT <0.2* <0.2*  PROT 6.6 6.5  ALBUMIN 2.4* 2.4*   No results found for this basename: LIPASE, AMYLASE,  in the last 168 hours No results found for this basename: AMMONIA,  in the last 168 hours CBC:  Recent Labs Lab 09/15/13 0545 09/16/13 0453 09/17/13 0540 09/18/13 0550  WBC 7.8 4.2 3.6* 4.2  NEUTROABS  --   --   --  2.2  HGB 9.5* 9.0* 9.7* 9.3*  HCT 28.2* 27.1* 28.9* 28.2*  MCV 86.8 86.6 86.5 87.3  PLT 341 236 212 178   Cardiac Enzymes: No results found for this basename: CKTOTAL, CKMB, CKMBINDEX, TROPONINI,  in the last 168 hours BNP (last 3 results) No results found for this basename: PROBNP,  in the last 8760 hours CBG:  Recent Labs Lab 09/20/13 1214 09/20/13 1614 09/20/13 2004 09/20/13 2352 09/21/13 0743  GLUCAP 135* 168* 118* 156* 161*    Recent Results (from the past 240 hour(s))  CLOSTRIDIUM DIFFICILE BY PCR     Status: Abnormal   Collection Time    09/15/13  2:32 PM      Result Value Ref Range Status   C difficile by pcr POSITIVE (*) NEGATIVE Final   Comment: CRITICAL RESULT CALLED TO, READ BACK BY AND VERIFIED WITH:     CRAVEN,M RN 09/15/13 2007 WOOTEN,K     Performed at [facility]     Studies: Dg Chest Port 1 View  09/19/2013   CLINICAL DATA:  42 year old female with possible foreign body in GI tract. Initial encounter.  EXAM: PORTABLE CHEST - 1 VIEW  COMPARISON:  09/13/2013.  FINDINGS: Partially visible percutaneous gastrostomy tube hardware, but catheter tubing no longer visible in the distal thoracic esophagus.  Interval placement right PICC line.  Lung volumes stable and within normal limits. Allowing for portable technique, the lungs are clear. Normal cardiac size and mediastinal contours. There is increased gas in the cervical and upper thoracic esophagus.  IMPRESSION: 1. Catheter tubing no longer visible in the distal thoracic esophagus. 2.  No acute cardiopulmonary  abnormality.   Electronically Signed   By: Lars Pinks M.D.   On: 09/19/2013 20:06    Scheduled Meds: . famotidine (PEPCID) IV  20 mg Intravenous Q12H  . feeding supplement (OSMOLITE 1.5 CAL)  1,000 mL Per Tube Q24H  . insulin aspart  0-20 Units Subcutaneous 6 times per day  . insulin glargine  10 Units Subcutaneous QHS  . metoCLOPramide (REGLAN) injection  10 mg Intravenous 4 times per day  . metoprolol  10 mg Intravenous Once  . metronidazole  500 mg Intravenous Q8H  . mometasone-formoterol  2 puff Inhalation BID  . sodium chloride  3 mL Intravenous Q12H   Continuous Infusions: . sodium chloride 20 mL/hr at 09/18/13 1700  . Marland KitchenTPN (CLINIMIX-E) Adult 50 mL/hr at 09/20/13 1743   And  . fat emulsion 250 mL (09/20/13 1743)  Active Problems:   Intractable nausea and vomiting   Dyslipidemia   GERD (gastroesophageal reflux disease)   Hypertension   DM (diabetes mellitus), type 2, uncontrolled   Dehydration   Hyponatremia   Leukocytosis, unspecified   Abdominal pain   Diabetic neuropathy   Hyperglycemia   Hypochloremia   Hemoptysis   Diabetic gastroparesis  Time spent: 53min  Magdaline Zollars, [facility] Pager [phone]. If 7PM-7AM, please contact night-coverage at [email or web address], password [facility] 09/21/2013, 9:07 AM  LOS: 8 days

## 2013-09-22 LAB — GLUCOSE, CAPILLARY
GLUCOSE-CAPILLARY: 141 mg/dL — AB (ref 70–99)
Glucose-Capillary: 123 mg/dL — ABNORMAL HIGH (ref 70–99)
Glucose-Capillary: 139 mg/dL — ABNORMAL HIGH (ref 70–99)
Glucose-Capillary: 167 mg/dL — ABNORMAL HIGH (ref 70–99)
Glucose-Capillary: 171 mg/dL — ABNORMAL HIGH (ref 70–99)
Glucose-Capillary: 200 mg/dL — ABNORMAL HIGH (ref 70–99)
Glucose-Capillary: 98 mg/dL (ref 70–99)

## 2013-09-22 LAB — BASIC METABOLIC PANEL
BUN: 11 mg/dL (ref 6–23)
CALCIUM: 8.3 mg/dL — AB (ref 8.4–10.5)
CO2: 23 mEq/L (ref 19–32)
CREATININE: 0.58 mg/dL (ref 0.50–1.10)
Chloride: 106 mEq/L (ref 96–112)
GFR calc non Af Amer: >90 mL/min (ref >90)
Glucose, Bld: 151 mg/dL — ABNORMAL HIGH (ref 70–99)
Potassium: 4 mEq/L (ref 3.7–5.3)
Sodium: 139 mEq/L (ref 137–147)

## 2013-09-22 MED ORDER — FAT EMULSION 20 % IV EMUL
250.0000 mL | INTRAVENOUS | Status: DC
  Filled 2013-09-22: qty 250

## 2013-09-22 MED ORDER — HYDROMORPHONE HCL PF 1 MG/ML IJ SOLN
0.5000 mg | Freq: Once | INTRAMUSCULAR | Status: AC
  Administered 2013-09-23: 0.5 mg via INTRAVENOUS
  Filled 2013-09-22: qty 1

## 2013-09-22 MED ORDER — M.V.I. ADULT IV INJ
INTRAVENOUS | Status: AC
  Administered 2013-09-22: 17:00:00 via INTRAVENOUS
  Filled 2013-09-22: qty 2000

## 2013-09-22 MED ORDER — FAT EMULSION 20 % IV EMUL
250.0000 mL | INTRAVENOUS | Status: AC
  Administered 2013-09-22: 250 mL via INTRAVENOUS
  Filled 2013-09-22: qty 250

## 2013-09-22 MED ORDER — TRACE MINERALS CR-CU-F-FE-I-MN-MO-SE-ZN IV SOLN
INTRAVENOUS | Status: DC
  Filled 2013-09-22: qty 2000

## 2013-09-22 NOTE — Progress Notes (Signed)
[facility] PROGRESS NOTE  [name] [id number] DOB: 1970-11-18 DOA: 09/13/2013 PCP: [name], MD  Assessment/Plan: 1. Enteral feeding. Patient with long-standing history of poorly controlled diabetes mellitus, diabetic gastroparesis, on enteral feeds. Given issues with G-J tube, with multiple G tube and G-J tubes in her past, Gen. surgery was consulted for J-tube placement. [name] felt that J-tube placement was not a good solution, and had multiple associated complications. Interventional radiology was consulted for GJ Tube replacement, s/p tube placement. Attempting to resume tube feeds. Unfortunately pt does not seem to tolerate tubefeeds, even at slower rate. Abd xray without signs of obstruction. 2. Diabetic gastroparesis. Patient having multiple hospitalizations do to diabetic gastroparesis. She currently sees [name] at [facility]. On scheduled reglan TID. [name] recommended consulting interventional radiology with temporizing GJ tube. He also recommended addressing her pain. With regard to gastric pacemaker implant he stated that this would not help with pain and would need to be addressed in the outpatient setting as this can take months to get approved. [name] did not feel that a transfer was necessary as the GI service there would not have anything to add. Nutrition following to assist with resuming gastric tube feeds with plans to hopefully stop TPN. 3. C diff Colitis. Patient stool, back positive for C. difficile as she was started on Flagyl 500 mg IV q. 8 hours. (started 2/18) 4. Type 2 diabetes mellitus, poorly controlled, continue Lantus 10 units subcutaneous daily with Accu-Cheks and sliding scale coverage. Blood sugars appear better controlled today. She is presently n.p.o. 5. Hypertension. Will continue PRN Labetolol and Hydralazine. 6. Hematemesis. Patient having an episode of hematemesis for which GI was consulted. She underwent upper endoscopy  which did not reveal active bleeding or significant blood within the stomach. Noted mild to moderate erosive esophagitis likely related to emesis. Continue PPI therapy.  7. Gastroesophageal reflux disease. PPI therapy 8. DVT prophylaxis. SCDs  Code Status: Full Family Communication: Pt in room (indicate person spoken with, relationship, and if by phone, the number) Disposition Plan: Pending   Consultants:  GI  Surgery  IR  Procedures:  G-J tube replacement by IR 09/18/13  Antibiotics:  Flagyl 09/18/13>>>  HPI/Subjective: Reports cont pain with trial of tube feeds. Feels better without tubefeed running.  Objective: Filed Vitals:   09/21/13 2014 09/21/13 2039 09/22/13 0404 09/22/13 0813  BP: 127/81  127/82   Pulse: 93  81   Temp: 98.6 F (37 C)  98.5 F (36.9 C)   TempSrc: Oral  Oral   Resp: 19  18   Height:      Weight:   70.806 kg (156 lb 1.6 oz)   SpO2: 100% 99% 100% 100%    Intake/Output Summary (Last 24 hours) at 09/22/13 0830 Last data filed at 09/22/13 0700  Gross per 24 hour  Intake   1260 ml  Output   3675 ml  Net  -2415 ml   Filed Weights   09/20/13 1418 09/21/13 0410 09/22/13 0404  Weight: 66.55 kg (146 lb 11.5 oz) 66.57 kg (146 lb 12.2 oz) 70.806 kg (156 lb 1.6 oz)    Exam:   General:  Awake, in nad  Cardiovascular: regular, s1, s2  Respiratory: normal resp effort, no wheezing  Abdomen: soft, nondistended, feeding tube in place  Musculoskeletal: perfused, no clubbing   Data Reviewed: Basic Metabolic Panel:  Recent Labs Lab 09/16/13 0453 09/17/13 0540 09/18/13 0550 09/19/13 0645 09/20/13 0510 09/21/13 0331 09/22/13 0500  NA 138  137 138 139 135* 134* 139  K 2.9* 3.5* 3.2* 3.4* 3.5* 3.5* 4.0  CL 100 103 102 103 100 99 106  CO2 24 23 25 23 24 23 23   GLUCOSE 102* 143* 178* 164* 124* 153* 151*  BUN 7 4* 4* 11 10 12 11   CREATININE 0.58 0.56 0.61 0.64 0.64 0.64 0.58  CALCIUM 8.1* 8.1* 8.6 8.7 8.5 8.4 8.3*  MG 1.2* 1.3* 1.2* 1.4*  1.6  --   --   PHOS  --   --  3.8 4.1  --   --   --    Liver Function Tests:  Recent Labs Lab 09/18/13 0550 09/19/13 0645  AST 15 13  ALT 8 6  ALKPHOS 83 80  BILITOT <0.2* <0.2*  PROT 6.6 6.5  ALBUMIN 2.4* 2.4*   No results found for this basename: LIPASE, AMYLASE,  in the last 168 hours No results found for this basename: AMMONIA,  in the last 168 hours CBC:  Recent Labs Lab 09/16/13 0453 09/17/13 0540 09/18/13 0550  WBC 4.2 3.6* 4.2  NEUTROABS  --   --  2.2  HGB 9.0* 9.7* 9.3*  HCT 27.1* 28.9* 28.2*  MCV 86.6 86.5 87.3  PLT 236 212 178   Cardiac Enzymes: No results found for this basename: CKTOTAL, CKMB, CKMBINDEX, TROPONINI,  in the last 168 hours BNP (last 3 results) No results found for this basename: PROBNP,  in the last 8760 hours CBG:  Recent Labs Lab 09/21/13 1707 09/21/13 2010 09/21/13 2342 09/22/13 0403 09/22/13 0735  GLUCAP 261* 79 191* 139* 123*    Recent Results (from the past 240 hour(s))  CLOSTRIDIUM DIFFICILE BY PCR     Status: Abnormal   Collection Time    09/15/13  2:32 PM      Result Value Ref Range Status   C difficile by pcr POSITIVE (*) NEGATIVE Final   Comment: CRITICAL RESULT CALLED TO, READ BACK BY AND VERIFIED WITH:     CRAVEN,M RN 09/15/13 2007 [place]     Performed at [facility]     Studies: Dg Abd Portable 1v  09/22/2013   CLINICAL DATA:  Diffuse abdominal pain, nausea and vomiting.  EXAM: PORTABLE ABDOMEN - 1 VIEW  COMPARISON:  DG ABD ACUTE W/CHEST dated 09/13/2013; CT ABD/PELVIS W CM dated 08/15/2013  FINDINGS: The visualized bowel gas pattern is unremarkable. Scattered air and contrast filled loops of colon are seen; air-filled loops of small bowel are also noted, without evidence of abnormal dilatation to suggest small bowel obstruction. No free intra-abdominal air is identified, though evaluation for free air is limited on a single supine view. Clips are noted within the right upper quadrant, reflecting prior  cholecystectomy. A GJ tube is noted ending at the third segment of the duodenum.  The visualized osseous structures are within normal limits; the sacroiliac joints are unremarkable in appearance. The patient is status post interbody fusion at L4-L5.  IMPRESSION: Unremarkable bowel gas pattern; no free intra-abdominal air seen. Contrast progresses to the descending and sigmoid colon.   Electronically Signed   By: Garald Balding M.D.   On: 09/22/2013 01:49    Scheduled Meds: . acetaminophen  650 mg Per Tube TID  . atorvastatin  40 mg Per Tube q1800  . busPIRone  5 mg Per Tube TID  . famotidine (PEPCID) IV  20 mg Intravenous Q12H  . feeding supplement (OSMOLITE 1.5 CAL)  1,000 mL Per Tube Q24H  . gabapentin  600 mg Per  Tube 3 times per day  . insulin aspart  0-20 Units Subcutaneous 6 times per day  . insulin glargine  10 Units Subcutaneous QHS  . metoCLOPramide (REGLAN) injection  10 mg Intravenous 4 times per day  . metronidazole  500 mg Intravenous Q8H  . mometasone-formoterol  2 puff Inhalation BID  . sodium chloride  3 mL Intravenous Q12H  . sucralfate  1 g Per Tube TID WC & HS   Continuous Infusions: . sodium chloride 20 mL/hr at 09/18/13 1700  . Marland KitchenTPN (CLINIMIX-E) Adult 50 mL/hr at 09/21/13 1739   And  . fat emulsion 250 mL (09/21/13 1738)    Active Problems:   Intractable nausea and vomiting   Dyslipidemia   GERD (gastroesophageal reflux disease)   Hypertension   DM (diabetes mellitus), type 2, uncontrolled   Dehydration   Hyponatremia   Leukocytosis, unspecified   Abdominal pain   Diabetic neuropathy   Hyperglycemia   Hypochloremia   Hemoptysis   Diabetic gastroparesis  Time spent: 70min  [facility], [facility] Pager [phone]. If 7PM-7AM, please contact night-coverage at [email or web address], password [facility] 09/22/2013, 8:30 AM  LOS: 9 days

## 2013-09-22 NOTE — Progress Notes (Signed)
[facility] Progress Note  Subjective: The patient states that they tried to feed her but she got abdominal pain.  Objective: Vital signs in last 24 hours: Temp:  [98.5 F (36.9 C)-98.6 F (37 C)] 98.6 F (37 C) (02/22 1345) Pulse Rate:  [81-93] 88 (02/22 1345) Resp:  [16-19] 16 (02/22 1345) BP: (127-128)/(79-82) 128/79 mmHg (02/22 1345) SpO2:  [99 %-100 %] 100 % (02/22 1345) Weight:  [70.806 kg (156 lb 1.6 oz)] 70.806 kg (156 lb 1.6 oz) (02/22 0404) Weight change: 4.256 kg (9 lb 6.1 oz)   PE:  He is in no distress  Abdomen soft  Lab Results: Results for orders placed during the hospital encounter of 09/13/13 (from the past 24 hour(s))  GLUCOSE, CAPILLARY     Status: Abnormal   Collection Time    09/21/13  5:07 PM      Result Value Ref Range   Glucose-Capillary 261 (*) 70 - 99 mg/dL  GLUCOSE, CAPILLARY     Status: None   Collection Time    09/21/13  8:10 PM      Result Value Ref Range   Glucose-Capillary 79  70 - 99 mg/dL  GLUCOSE, CAPILLARY     Status: Abnormal   Collection Time    09/21/13 11:42 PM      Result Value Ref Range   Glucose-Capillary 191 (*) 70 - 99 mg/dL  GLUCOSE, CAPILLARY     Status: Abnormal   Collection Time    09/22/13  4:03 AM      Result Value Ref Range   Glucose-Capillary 139 (*) 70 - 99 mg/dL  BASIC METABOLIC PANEL     Status: Abnormal   Collection Time    09/22/13  5:00 AM      Result Value Ref Range   Sodium 139  137 - 147 mEq/L   Potassium 4.0  3.7 - 5.3 mEq/L   Chloride 106  96 - 112 mEq/L   CO2 23  19 - 32 mEq/L   Glucose, Bld 151 (*) 70 - 99 mg/dL   BUN 11  6 - 23 mg/dL   Creatinine, Ser 0.58  0.50 - 1.10 mg/dL   Calcium 8.3 (*) 8.4 - 10.5 mg/dL   GFR calc non Af Amer >90  >90 mL/min   GFR calc Af Amer >90  >90 mL/min  GLUCOSE, CAPILLARY     Status: Abnormal   Collection Time    09/22/13  7:35 AM      Result Value Ref Range   Glucose-Capillary 123 (*) 70 - 99 mg/dL  GLUCOSE, CAPILLARY     Status: Abnormal   Collection Time    09/22/13 11:39 AM      Result Value Ref Range   Glucose-Capillary 200 (*) 70 - 99 mg/dL    Studies/Results: @[id number] @    Assessment: Diabetic gastroparesis. Refractory to medical therapy.  Plan: Continue current management. Further decisions on long-term management need to be made    Marika Mahaffy F 09/22/2013, 3:57 PM

## 2013-09-22 NOTE — Progress Notes (Signed)
PARENTERAL NUTRITION CONSULT NOTE - INITIAL  Pharmacy Consult for TNA Indication: intolerance to enteral feeds  Allergies  Allergen Reactions  . Cefadroxil Other (See Comments)    Not sure of effects-listed as allergy on list from home  . Compazine [Prochlorperazine Edisylate] Hives    Tolerates promethazine  . Darvocet [Propoxyphene N-Acetaminophen] Other (See Comments)    UNKNOWN  . Zofran [Ondansetron Hcl] Hives  . Nsaids Hives  . Penicillins Hives   Patient Measurements: Height: 5\' 4"  (162.6 cm) Weight: 156 lb 1.6 oz (70.806 kg) IBW/kg (Calculated) : 54.7  Vital Signs: Temp: 98.5 F (36.9 C) (02/22 0404) Temp src: Oral (02/22 0404) BP: 127/82 mmHg (02/22 0404) Pulse Rate: 81 (02/22 0404) Intake/Output from previous day: 02/21 0701 - 02/22 0700 In: 1260 [P.O.:240; I.V.:100; IV Piggyback:200; TPN:720] Out: 4175 [Urine:4175]  Labs: No results found for this basename: WBC, HGB, HCT, PLT, APTT, INR,  in the last 72 hours   Recent Labs  09/20/13 0510 09/21/13 0331 09/22/13 0500  NA 135* 134* 139  K 3.5* 3.5* 4.0  CL 100 99 106  CO2 24 23 23   GLUCOSE 124* 153* 151*  BUN 10 12 11   CREATININE 0.64 0.64 0.58  CALCIUM 8.5 8.4 8.3*  MG 1.6  --   --    Estimated Creatinine Clearance: 87.5 ml/min (by C-G formula based on Cr of 0.58).    Recent Labs  09/21/13 2342 09/22/13 0403 09/22/13 0735  GLUCAP 191* 139* 123*   CBGs & Insulin requirements past 24 hours:  - CBGs 161, 140, 191/ 123, required 21/7 units of Novolog sensitive SSI q4h  - Lantus 10 units qhs  Assessment:  42 yoF with h/o pancreatitis, DM with diabetic gastroparesis s/p J-tube placement with multiple issues presented 2/13 with N/V/abd pain. The J-tube was removed and a new G-tube was placed. Patient unable to tolerate intragastric feedings given N/V/abd pain. Patient also found with C.difficile infection this admission (on Flagyl). Unable to transfer [facility] to [facility] as MDs there cannot offer  more options. GI, surgery, IR on board - plan GJ tube replacement. In the meantime, IR placced PICC and start TNA.   2/21: D#6 TPN, GJ  tube re-placed by IR 2/18.  TF attempted resume 2/20 & 2/21 but unable to tolerate with increase abd pain.  Nutritional Goals:  - RD recs 2/17: 1600-1800 Kcal/day, 80-95 g protein/day, 2.1L fluid per day - Clinimix E 5/15 @ 75 ml/hr + IVF 20% at 10 ml/hr will provide: 90 g protein/day, 1758 Kcal/day, 1.8 L/day  Current nutrition:  - Diet: NPO starting 2/13 - TNA:  started 2/17 PM  - mIVF: NS @ 20 ml/hr  Labs: Electrolytes:  K+ 4, corrected Ca = 9.1: K improved after 4 runs, had attempted po Kcl, but patient did not tolerate, so given IV. Renal Function: Scr wnl/stable, good UOP Hepatic Function: WNL Pre-Albumin: 17.7 (2/18) Triglycerides: 170 (2/18) CBGs: H/o uncontrolled DM on Novolog SSI q4h and Lantus 10 units qhs PTA (both resumed inpatient). Couple of hypoglycemic events few days ago - resolved  Plan:  At 1800 tonight  Will continue TPN at 50 ml/hr for tonight as plan to ? retry tube feed (again)    IV fat emulsion 20% at 10 ml/hr daily, Standard multivitamins daily  Due to national backorder, trace elements MWF only  Continue Novolog SSI to resistant scale q4h.  Continue Lantus 10 units qhs   TNA labs Monday/Thursdays  Minda Ditto PharmD Pager [phone] 09/22/2013, 11:33 AM

## 2013-09-23 LAB — COMPREHENSIVE METABOLIC PANEL
ALBUMIN: 2.1 g/dL — AB (ref 3.5–5.2)
ALT: 7 U/L (ref 0–35)
AST: 18 U/L (ref 0–37)
Alkaline Phosphatase: 75 U/L (ref 39–117)
BUN: 13 mg/dL (ref 6–23)
CO2: 23 mEq/L (ref 19–32)
CREATININE: 0.54 mg/dL (ref 0.50–1.10)
Calcium: 8.3 mg/dL — ABNORMAL LOW (ref 8.4–10.5)
Chloride: 106 mEq/L (ref 96–112)
GFR calc Af Amer: >90 mL/min (ref >90)
GFR calc non Af Amer: >90 mL/min (ref >90)
Glucose, Bld: 140 mg/dL — ABNORMAL HIGH (ref 70–99)
POTASSIUM: 3.6 meq/L — AB (ref 3.7–5.3)
Sodium: 138 mEq/L (ref 137–147)
TOTAL PROTEIN: 6 g/dL (ref 6.0–8.3)
Total Bilirubin: <0.2 mg/dL — ABNORMAL LOW (ref 0.3–1.2)

## 2013-09-23 LAB — DIFFERENTIAL
BASOS ABS: 0 10*3/uL (ref 0.0–0.1)
Basophils Relative: 1 % (ref 0–1)
Eosinophils Absolute: 0.3 10*3/uL (ref 0.0–0.7)
Eosinophils Relative: 7 % — ABNORMAL HIGH (ref 0–5)
LYMPHS ABS: 2.2 10*3/uL (ref 0.7–4.0)
Lymphocytes Relative: 56 % — ABNORMAL HIGH (ref 12–46)
MONO ABS: 0.3 10*3/uL (ref 0.1–1.0)
Monocytes Relative: 7 % (ref 3–12)
Neutro Abs: 1.2 10*3/uL — ABNORMAL LOW (ref 1.7–7.7)
Neutrophils Relative %: 29 % — ABNORMAL LOW (ref 43–77)

## 2013-09-23 LAB — CBC
HCT: 26.6 % — ABNORMAL LOW (ref 36.0–46.0)
Hemoglobin: 8.6 g/dL — ABNORMAL LOW (ref 12.0–15.0)
MCH: 28.3 pg (ref 26.0–34.0)
MCHC: 32.3 g/dL (ref 30.0–36.0)
MCV: 87.5 fL (ref 78.0–100.0)
Platelets: 139 10*3/uL — ABNORMAL LOW (ref 150–400)
RBC: 3.04 MIL/uL — ABNORMAL LOW (ref 3.87–5.11)
RDW: 15.1 % (ref 11.5–15.5)
WBC: 4 10*3/uL (ref 4.0–10.5)

## 2013-09-23 LAB — GLUCOSE, CAPILLARY
GLUCOSE-CAPILLARY: 131 mg/dL — AB (ref 70–99)
GLUCOSE-CAPILLARY: 157 mg/dL — AB (ref 70–99)
GLUCOSE-CAPILLARY: 81 mg/dL (ref 70–99)
GLUCOSE-CAPILLARY: 87 mg/dL (ref 70–99)
Glucose-Capillary: 126 mg/dL — ABNORMAL HIGH (ref 70–99)
Glucose-Capillary: 144 mg/dL — ABNORMAL HIGH (ref 70–99)

## 2013-09-23 LAB — TRIGLYCERIDES: Triglycerides: 165 mg/dL — ABNORMAL HIGH (ref <150)

## 2013-09-23 LAB — MAGNESIUM: MAGNESIUM: 1.3 mg/dL — AB (ref 1.5–2.5)

## 2013-09-23 LAB — PREALBUMIN: Prealbumin: 17.8 mg/dL — ABNORMAL LOW (ref 17.0–34.0)

## 2013-09-23 LAB — PHOSPHORUS: PHOSPHORUS: 3.9 mg/dL (ref 2.3–4.6)

## 2013-09-23 MED ORDER — FAT EMULSION 20 % IV EMUL
250.0000 mL | INTRAVENOUS | Status: AC
  Administered 2013-09-23: 250 mL via INTRAVENOUS
  Filled 2013-09-23: qty 250

## 2013-09-23 MED ORDER — MAGNESIUM SULFATE 40 MG/ML IJ SOLN
2.0000 g | Freq: Once | INTRAMUSCULAR | Status: AC
  Administered 2013-09-23: 2 g via INTRAVENOUS
  Filled 2013-09-23: qty 50

## 2013-09-23 MED ORDER — TRACE MINERALS CR-CU-F-FE-I-MN-MO-SE-ZN IV SOLN
INTRAVENOUS | Status: AC
  Administered 2013-09-23: 17:00:00 via INTRAVENOUS
  Filled 2013-09-23: qty 2000

## 2013-09-23 MED ORDER — POTASSIUM CHLORIDE 10 MEQ/100ML IV SOLN
10.0000 meq | INTRAVENOUS | Status: AC
  Administered 2013-09-23 (×2): 10 meq via INTRAVENOUS
  Filled 2013-09-23 (×2): qty 100

## 2013-09-23 NOTE — Progress Notes (Signed)
NUTRITION FOLLOW UP  Intervention:   -Continue with Reglan -TPN per pharmacy -Resume TF per GI -Will continue to monitor  Nutrition Dx:   Inadequate oral intake related to inability to eat as evidenced by NPO status-ongoing   Goal:   TPN to meet >/= 90% of their estimated nutrition needs    Monitor:   TF order/tolerance, TPN tolerance, total protein/energy intake, GI profile, labs, weights    Assessment:   2/13: -Pt with hx of TF intolerance d/t nausea/vomiting and refusing bolus tube feeding. Per previous medical records, pt may be more willing to try continuous feedings over home bolus regimen  -Pt was receiving Vital 1.2 AF at 237 ml Q3H. 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 Will place recommendations to implement as warranted  -Sees gastroparesis specialist at [facility]  -Usual weight of 149 lbs per previous RD assessment  -Drinks Glucerna shakes occasionally. Allow as tolerated   2/16:  -Pt continues to experience TF intolerance TF was held after pt with nausea/vomiting after initiating Vital AF 1.2 at 20 ml/hr.  -Pt may undergo replacement G-J tube  -Pt also may be transferred to [facility] for further GI evaluation per discussion with RN. No plans to restart TF today  -Pt at risk for refeeding d/t prolonged period of suboptimal nutrition. Mag and K low. Recommend refeeding labs be run if TF initiated and opt for conservative advancemet   2/17: -Discussed TPN with pharmacy. Monitor refeeding labs and recommend conservative advancement  -See nutrition follow up note completed on 2/16 for additional information  -Will continue to monitor  2/19: -Per discussion with pharmacy and MD,  plan to initiate TF today as pt s/p GJ tube replacement -Will recommend conservative initiation and advancement d/t pt's hx of TF tolerance and prolonged period of sub-optimal nutrition -GI recommended "gatorade feeds" with goal of 1L/day. Will modify previous TF goal rate to meet GI recommendations. -Vital AF 1.2 at 42 ml/hr will provide 1200 kcal (75% est kcal needs), and 75 gram protein (94% est protein needs) -Pharmacy did not advance TPN d/t start of TF. Recommended Clinimix 5/15 at 50 ml/hr with fat emulsion 20% of 10 ml/hr to provide approximately 50% of estimated nutrition needs   2/20: -TF were started on pt at 10 ml/hr on 2/19. Per RN documentation, pt began to experience nausea/pain and had one episode emesis.  -Continue with Reglan -Per discussion with MD, trickle tube feeds are to be restarted today. Will continue to run Clinimix 5/15 at 50 ml/hr w/IVF 20% at 10 ml/hr to provide approximately 50% of estimated nutrition needs per pharmacy. MD requested TPN not to be advanced to goal today -GI noted diarrhea has improved -Potassium low -Phos and Mag WNL -CBGs: 77-173  2/20:  -TF's were re-started today at 10 ml/hr. Were held d/t pt's complaint of nausea/vomiting  -Per discussion with pt, pt had not been utilizing Vital AF 1.2 at home for nutrition support.  -Home regimen consisted of nocturnal feeds of Osmolite 1.5 at 40 ml/hr for 14 hours. Would have 1-2 bolus during the day and snack on pretzels/crackers, did not consume whole meals. Pt denied any intolerance except for the vomiting episodes that started on 2/14  -Pt reported usual body  weight around 142 lbs. Denied any recent loss or gain. Requested from pt's Nurse Tech for pt to be re-weighed to assess recent weight trends. Will continue to monitor  -Had been placed on Vital AF 1.2 for GI tolerance once surgery approved initiation of TFs; however, d/t continued intolerance with Vital AF 1.2, will trial pt's home TF regimen   -Received phone call from [facility] RD to assist with pt's home nutrition needs and insurance coverage  -Pt would benefit from having gastric motility study for gastroparesis. Discussed this with MD, who noted that pt likely has completed this through primary care doctor. Will review results  -Pt with low albumin- 2.4. Monitor trends.  -MD expressed goal for pt is to be discharged with TF as main source of nutrition. Pharmacy to wean off TPN as tolerated.   2/23: -Re-attempted to start Osmolite 1.5 over the weekend at 10 ml/hr. Pt complained of nausea/abd pain each time -Per discussion with MD, no clear endpoint or reccommendations for restarting TF from GI.  -Pharmacy increased Clinimix 5/15 to goal rate of 75 ml/hr to provide 1758 kcal, 90 gram protein (100% est nutrition needs). TE MWF -CBG's controlled -K and Mag low, replete per pharmacy -Phos WNL -Weight fluctuating from 146-156 lbs past 2 days. May be related to bed vs standing weight methods as today first day pt receiving goal rate of TPN and has minimal nutrition provided by TF   Height: Ht Readings from Last 1 Encounters:  09/13/13 5\' 4"  (1.626 m)    Weight Status:   Wt Readings from Last 1 Encounters:  09/23/13 152 lb 1.9 oz (69 kg)    Re-estimated needs:  Kcal: 1600-1800  Protein: 80-95 gram  Fluid: 2100 ml//daily   Skin: WDL  Diet Order: NPO   Intake/Output Summary (Last 24 hours) at 09/23/13 1528 Last data filed at 09/23/13 1352  Gross per 24 hour  Intake   2361 ml  Output   2300 ml  Net     61 ml    Last BM: 2/22   Labs:   Recent Labs Lab 09/18/13 0550 09/19/13 0645 09/20/13 0510 09/21/13 0331 09/22/13 0500 09/23/13 0400  NA 138 139 135* 134* 139 138  K 3.2* 3.4* 3.5* 3.5* 4.0 3.6*  CL 102 103 100 99 106 106  CO2 25 23 24 23 23 23   BUN 4* 11 10 12 11 13   CREATININE 0.61 0.64 0.64 0.64 0.58 0.54  CALCIUM 8.6 8.7 8.5 8.4 8.3* 8.3*  MG 1.2* 1.4* 1.6  --   --  1.3*  PHOS 3.8 4.1   --   --   --  3.9  GLUCOSE 178* 164* 124* 153* 151* 140*    CBG (last 3)   Recent Labs  09/22/13 2358 09/23/13 0503 09/23/13 0736  GLUCAP 98 131* 126*    Scheduled Meds: . acetaminophen  650 mg Per Tube TID  . atorvastatin  40 mg Per Tube q1800  . busPIRone  5 mg Per Tube TID  . famotidine (PEPCID) IV  20 mg Intravenous Q12H  . gabapentin  600 mg Per Tube 3 times per day  . insulin aspart  0-20 Units Subcutaneous 6 times per day  . insulin glargine  10 Units Subcutaneous QHS  . metoCLOPramide (REGLAN) injection  10 mg Intravenous 4 times per day  . metronidazole  500 mg Intravenous Q8H  . mometasone-formoterol  2 puff Inhalation BID  . potassium chloride  10 mEq Intravenous Q1 Hr x 2  .  sodium chloride  3 mL Intravenous Q12H  . sucralfate  1 g Per Tube TID WC & HS    Continuous Infusions: . sodium chloride 20 mL/hr at 09/18/13 1700  . Marland KitchenTPN (CLINIMIX-E) Adult 50 mL/hr at 09/22/13 1724   And  . fat emulsion 250 mL (09/22/13 1725)  . Marland KitchenTPN (CLINIMIX-E) Adult     And  . fat emulsion      Atlee Abide MS RD LDN Clinical Dietitian [id number]

## 2013-09-23 NOTE — Progress Notes (Signed)
Patient ID: [name], female   DOB: October 13, 1970, 43 y.o.   MRN: [id number] [facility] Progress Note  [name] 43 y.o. Apr 14, 1971   Subjective: Continues to have abdominal pain that she feels is worse then her baseline pain. Dry heaves.  Objective: Vital signs in last 24 hours: Filed Vitals:   09/23/13 0500  BP: 135/83  Pulse: 81  Temp: 98.1 F (36.7 C)  Resp: 20    Physical Exam: Gen: alert, no acute distress Abd: diffusely tender with guarding (light palpation), soft, nondistended, +BS  Lab Results:  Recent Labs  09/22/13 0500 09/23/13 0400  NA 139 138  K 4.0 3.6*  CL 106 106  CO2 23 23  GLUCOSE 151* 140*  BUN 11 13  CREATININE 0.58 0.54  CALCIUM 8.3* 8.3*  MG  --  1.3*  PHOS  --  3.9    Recent Labs  09/23/13 0400  AST 18  ALT 7  ALKPHOS 75  BILITOT <0.2*  PROT 6.0  ALBUMIN 2.1*    Recent Labs  09/23/13 0400  WBC 4.0  NEUTROABS 1.2*  HGB 8.6*  HCT 26.6*  MCV 87.5  PLT 139*   No results found for this basename: LABPROT, INR,  in the last 72 hours    Assessment/Plan: 43 yo with diabetic gastroparesis and chronic pain on TNA. C diff on Flagyl. No stools recorded. Symptoms refractory to medical management. Continue supportive care. Endpoint of changing off of TNA and retrying tube feeds not known but would continue TNA for now.   [place] C. 09/23/2013, 10:30 AM

## 2013-09-23 NOTE — Progress Notes (Signed)
PARENTERAL NUTRITION CONSULT NOTE - Follow Up  Pharmacy Consult for TNA Indication: intolerance to enteral feeds  Allergies  Allergen Reactions  . Cefadroxil Other (See Comments)    Not sure of effects-listed as allergy on list from home  . Compazine [Prochlorperazine Edisylate] Hives    Tolerates promethazine  . Darvocet [Propoxyphene N-Acetaminophen] Other (See Comments)    UNKNOWN  . Zofran [Ondansetron Hcl] Hives  . Nsaids Hives  . Penicillins Hives   Patient Measurements: Height: 5\' 4"  (162.6 cm) Weight: 152 lb 1.9 oz (69 kg) IBW/kg (Calculated) : 54.7  Vital Signs: Temp: 98.1 F (36.7 C) (02/23 0500) Temp src: Oral (02/23 0500) BP: 135/83 mmHg (02/23 0500) Pulse Rate: 81 (02/23 0500) Intake/Output from previous day: 02/22 0701 - 02/23 0700 In: 3081 [I.V.:1961; IV Piggyback:400; TPN:720] Out: 1700 [Urine:1700]  Labs:  Recent Labs  09/23/13 0400  WBC 4.0  HGB 8.6*  HCT 26.6*  PLT 139*     Recent Labs  09/21/13 0331 09/22/13 0500 09/23/13 0400  NA 134* 139 138  K 3.5* 4.0 3.6*  CL 99 106 106  CO2 23 23 23   GLUCOSE 153* 151* 140*  BUN 12 11 13   CREATININE 0.64 0.58 0.54  CALCIUM 8.4 8.3* 8.3*  MG  --   --  1.3*  PHOS  --   --  3.9  PROT  --   --  6.0  ALBUMIN  --   --  2.1*  AST  --   --  18  ALT  --   --  7  ALKPHOS  --   --  75  BILITOT  --   --  <0.2*  TRIG  --   --  165*   Estimated Creatinine Clearance: 86.5 ml/min (by C-G formula based on Cr of 0.54).    Recent Labs  09/22/13 2358 09/23/13 0503 09/23/13 0736  GLUCAP 98 131* 126*   CBGs & Insulin requirements past 24 hours:  - CBGs 98-171, received 21 units of Novolog resistant SSI q4h  - Lantus 10 units qhs  Assessment:  43 yoF with h/o pancreatitis, DM with diabetic gastroparesis s/p J-tube placement with multiple issues presented 2/13 with N/V/abd pain. The J-tube was removed and a new G-tube was placed. Patient unable to tolerate intragastric feedings given N/V/abd pain.  Patient also found with C.difficile infection this admission (on Flagyl). Unable to transfer patient to [facility] as MDs there cannot offer more options. GI, surgery, IR on board - plan GJ tube replacement. In the meantime, IR placced PICC and start TNA.   2/23: D#7 TPN, GJ  tube re-placed by IR 2/18.  TF attempted resume 2/20 & 2/21 but unable to tolerate with increase abd pain.  Patient still having chronic abdominal pain.  GI notes to continue TNA for now and unknown at this time when will retry tube feeds.  Nutritional Goals:  - RD recs 2/17: 1600-1800 Kcal/day, 80-95 g protein/day, 2.1L fluid per day - Clinimix E 5/15 @ 75 ml/hr + IVF 20% at 10 ml/hr will provide: 90 g protein/day, 1758 Kcal/day, 1.8 L/day  Current nutrition:  - Diet: NPO starting 2/13 - TNA:  started 2/17 PM  - mIVF: NS @ 20 ml/hr  Labs: Electrolytes:  K+ 3.6, corrected Ca = 9.8. Mag 1.3, Phos, other lytes WNL.  Will replace Mag and K+ today. Renal Function: Scr wnl/stable, good UOP Hepatic Function: WNL Pre-Albumin: 17.7 (2/18) Triglycerides: 165 CBGs: H/o uncontrolled DM on Novolog SSI q4h and Lantus 10  units qhs PTA (both resumed inpatient). Couple of hypoglycemic events few days ago - resolved.  CBGs high yesterday but are controlled today.  May need to add insulin to TNA as rate is increased this evening.  Will f/u CBGs tomorrow.  Plan:  At 1800 tonight  Will advance Clinimix E 5/15 to goal rate of 75 ml/hr since endpoint of changing off of TNA and retrying tube feeds not known per GI.  IV fat emulsion 20% at 10 ml/hr daily  IV multivitamin daily provided in TNA.  Trace elements provided MWF due to national shortage.  Continue Novolog SSI to resistant scale q4h.  Magnesium sulfate 2g IV x 1.  KCl 10 mEq/100 mL IV x 2 runs.  Continue Lantus 10 units qhs   TNA labs Monday/Thursdays.  Hershal Coria, PharmD, BCPS Pager: [phone] 09/23/2013 11:17 AM

## 2013-09-23 NOTE — Progress Notes (Signed)
[facility] PROGRESS NOTE  ESHIKA RIGALI [id number] DOB: 07/24/1971 DOA: 09/13/2013 PCP: [name], MD  Assessment/Plan: 1. Enteral feeding. Patient with long-standing history of poorly controlled diabetes mellitus, diabetic gastroparesis, on enteral feeds. Given issues with G-J tube, with multiple G tube and G-J tubes in her past, Gen. surgery was consulted for J-tube placement. [name] felt that J-tube placement was not a good solution, and had multiple associated complications. Interventional radiology was consulted for GJ Tube replacement, s/p tube placement. We had attempted to resume tube feeds multiple times, unfortunately pt does not seem to tolerate tubefeeds, even at a slower rate. Abd xray without signs of obstruction. GI following with recs for cont supportive care. 2. Diabetic gastroparesis. Patient having multiple hospitalizations do to diabetic gastroparesis. She currently sees [name] at [facility] On scheduled reglan TID. [name] recommended consulting interventional radiology with temporizing GJ tube. He also recommended addressing her pain. With regard to gastric pacemaker implant he stated that this would not help with pain and would need to be addressed in the outpatient setting as this can take months to get approved. [name] did not feel that a transfer was necessary as the GI service there would not have anything to add. Nutrition following to assist with resuming gastric tube feeds with plans to hopefully stop TPN eventually 3. C diff Colitis. Patient stool, back positive for C. difficile as she was started on Flagyl 500 mg IV q. 8 hours. (started 2/18) 4. Type 2 diabetes mellitus, poorly controlled, continue Lantus 10 units subcutaneous daily with Accu-Cheks and sliding scale coverage. Blood sugars appear better controlled today. She is presently n.p.o. 5. Hypertension. Will continue PRN Labetolol and Hydralazine. 6. Hematemesis. Patient having an  episode of hematemesis for which GI was consulted. She underwent upper endoscopy which did not reveal active bleeding or significant blood within the stomach. Noted mild to moderate erosive esophagitis likely related to emesis. Continue PPI therapy.  7. Gastroesophageal reflux disease. PPI therapy 8. DVT prophylaxis. SCDs  Code Status: Full Family Communication: Pt in room (indicate person spoken with, relationship, and if by phone, the number) Disposition Plan: Pending   Consultants:  GI  Surgery  IR  Procedures:  G-J tube replacement by IR 09/18/13  Antibiotics:  Flagyl 09/18/13>>>  HPI/Subjective: No acute events. Still not tolerating tube feeds.  Objective: Filed Vitals:   09/22/13 2101 09/22/13 2141 09/23/13 0500 09/23/13 0810  BP: 109/91 109/75 135/83   Pulse: 82 81 81   Temp: 97.6 F (36.4 C)  98.1 F (36.7 C)   TempSrc: Oral  Oral   Resp: 18  20   Height:      Weight:   69 kg (152 lb 1.9 oz)   SpO2: 100% 100% 100% 100%    Intake/Output Summary (Last 24 hours) at 09/23/13 1100 Last data filed at 09/23/13 1008  Gross per 24 hour  Intake   2931 ml  Output   2600 ml  Net    331 ml   Filed Weights   09/21/13 0410 09/22/13 0404 09/23/13 0500  Weight: 66.57 kg (146 lb 12.2 oz) 70.806 kg (156 lb 1.6 oz) 69 kg (152 lb 1.9 oz)    Exam:   General:  Awake, in nad  Cardiovascular: regular, s1, s2  Respiratory: normal resp effort, no wheezing  Abdomen: soft, nondistended, feeding tube in place  Musculoskeletal: perfused, no clubbing   Data Reviewed: Basic Metabolic Panel:  Recent Labs Lab 09/17/13 0540 09/18/13 0550 09/19/13 0645  09/20/13 0510 09/21/13 0331 09/22/13 0500 09/23/13 0400  NA 137 138 139 135* 134* 139 138  K 3.5* 3.2* 3.4* 3.5* 3.5* 4.0 3.6*  CL 103 102 103 100 99 106 106  CO2 23 25 23 24 23 23 23   GLUCOSE 143* 178* 164* 124* 153* 151* 140*  BUN 4* 4* 11 10 12 11 13   CREATININE 0.56 0.61 0.64 0.64 0.64 0.58 0.54  CALCIUM 8.1*  8.6 8.7 8.5 8.4 8.3* 8.3*  MG 1.3* 1.2* 1.4* 1.6  --   --  1.3*  PHOS  --  3.8 4.1  --   --   --  3.9   Liver Function Tests:  Recent Labs Lab 09/18/13 0550 09/19/13 0645 09/23/13 0400  AST 15 13 18   ALT 8 6 7   ALKPHOS 83 80 75  BILITOT <0.2* <0.2* <0.2*  PROT 6.6 6.5 6.0  ALBUMIN 2.4* 2.4* 2.1*   No results found for this basename: LIPASE, AMYLASE,  in the last 168 hours No results found for this basename: AMMONIA,  in the last 168 hours CBC:  Recent Labs Lab 09/17/13 0540 09/18/13 0550 09/23/13 0400  WBC 3.6* 4.2 4.0  NEUTROABS  --  2.2 1.2*  HGB 9.7* 9.3* 8.6*  HCT 28.9* 28.2* 26.6*  MCV 86.5 87.3 87.5  PLT 212 178 139*   Cardiac Enzymes: No results found for this basename: CKTOTAL, CKMB, CKMBINDEX, TROPONINI,  in the last 168 hours BNP (last 3 results) No results found for this basename: PROBNP,  in the last 8760 hours CBG:  Recent Labs Lab 09/22/13 1629 09/22/13 2006 09/22/13 2358 09/23/13 0503 09/23/13 0736  GLUCAP 171* 167* 98 131* 126*    Recent Results (from the past 240 hour(s))  CLOSTRIDIUM DIFFICILE BY PCR     Status: Abnormal   Collection Time    09/15/13  2:32 PM      Result Value Ref Range Status   C difficile by pcr POSITIVE (*) NEGATIVE Final   Comment: CRITICAL RESULT CALLED TO, READ BACK BY AND VERIFIED WITH:     CRAVEN,M RN 09/15/13 2007 [place]     Performed at [facility]     Studies: Dg Abd Portable 1v  09/22/2013   CLINICAL DATA:  Diffuse abdominal pain, nausea and vomiting.  EXAM: PORTABLE ABDOMEN - 1 VIEW  COMPARISON:  DG ABD ACUTE W/CHEST dated 09/13/2013; CT ABD/PELVIS W CM dated 08/15/2013  FINDINGS: The visualized bowel gas pattern is unremarkable. Scattered air and contrast filled loops of colon are seen; air-filled loops of small bowel are also noted, without evidence of abnormal dilatation to suggest small bowel obstruction. No free intra-abdominal air is identified, though evaluation for free air is limited on a  single supine view. Clips are noted within the right upper quadrant, reflecting prior cholecystectomy. A GJ tube is noted ending at the third segment of the duodenum.  The visualized osseous structures are within normal limits; the sacroiliac joints are unremarkable in appearance. The patient is status post interbody fusion at L4-L5.  IMPRESSION: Unremarkable bowel gas pattern; no free intra-abdominal air seen. Contrast progresses to the descending and sigmoid colon.   Electronically Signed   By: Garald Balding M.D.   On: 09/22/2013 01:49    Scheduled Meds: . acetaminophen  650 mg Per Tube TID  . atorvastatin  40 mg Per Tube q1800  . busPIRone  5 mg Per Tube TID  . famotidine (PEPCID) IV  20 mg Intravenous Q12H  . feeding supplement (OSMOLITE  1.5 CAL)  1,000 mL Per Tube Q24H  . gabapentin  600 mg Per Tube 3 times per day  . insulin aspart  0-20 Units Subcutaneous 6 times per day  . insulin glargine  10 Units Subcutaneous QHS  . metoCLOPramide (REGLAN) injection  10 mg Intravenous 4 times per day  . metronidazole  500 mg Intravenous Q8H  . mometasone-formoterol  2 puff Inhalation BID  . sodium chloride  3 mL Intravenous Q12H  . sucralfate  1 g Per Tube TID WC & HS   Continuous Infusions: . sodium chloride 20 mL/hr at 09/18/13 1700  . Marland KitchenTPN (CLINIMIX-E) Adult 50 mL/hr at 09/22/13 1724   And  . fat emulsion 250 mL (09/22/13 1725)    Active Problems:   Intractable nausea and vomiting   Dyslipidemia   GERD (gastroesophageal reflux disease)   Hypertension   DM (diabetes mellitus), type 2, uncontrolled   Dehydration   Hyponatremia   Leukocytosis, unspecified   Abdominal pain   Diabetic neuropathy   Hyperglycemia   Hypochloremia   Hemoptysis   Diabetic gastroparesis  Time spent: 43min  Kateena Degroote, [place] [facility] Pager [phone]. If 7PM-7AM, please contact night-coverage at [email or web address], password [facility] 09/23/2013, 11:00 AM  LOS: 10 days

## 2013-09-24 LAB — MAGNESIUM: MAGNESIUM: 1.6 mg/dL (ref 1.5–2.5)

## 2013-09-24 LAB — BASIC METABOLIC PANEL
BUN: 14 mg/dL (ref 6–23)
CHLORIDE: 102 meq/L (ref 96–112)
CO2: 22 meq/L (ref 19–32)
Calcium: 8.4 mg/dL (ref 8.4–10.5)
Creatinine, Ser: 0.6 mg/dL (ref 0.50–1.10)
GFR calc Af Amer: >90 mL/min (ref >90)
GFR calc non Af Amer: >90 mL/min (ref >90)
Glucose, Bld: 162 mg/dL — ABNORMAL HIGH (ref 70–99)
Potassium: 3.6 mEq/L — ABNORMAL LOW (ref 3.7–5.3)
SODIUM: 135 meq/L — AB (ref 137–147)

## 2013-09-24 LAB — GLUCOSE, CAPILLARY
GLUCOSE-CAPILLARY: 166 mg/dL — AB (ref 70–99)
Glucose-Capillary: 150 mg/dL — ABNORMAL HIGH (ref 70–99)
Glucose-Capillary: 159 mg/dL — ABNORMAL HIGH (ref 70–99)
Glucose-Capillary: 180 mg/dL — ABNORMAL HIGH (ref 70–99)
Glucose-Capillary: 183 mg/dL — ABNORMAL HIGH (ref 70–99)

## 2013-09-24 MED ORDER — POTASSIUM CHLORIDE 10 MEQ/100ML IV SOLN
10.0000 meq | INTRAVENOUS | Status: AC
  Administered 2013-09-24 (×2): 10 meq via INTRAVENOUS
  Filled 2013-09-24 (×2): qty 100

## 2013-09-24 MED ORDER — FAT EMULSION 20 % IV EMUL
250.0000 mL | INTRAVENOUS | Status: DC
  Administered 2013-09-24: 250 mL via INTRAVENOUS
  Filled 2013-09-24: qty 250

## 2013-09-24 MED ORDER — VANCOMYCIN 50 MG/ML ORAL SOLUTION: 125.0000 mg | Freq: Four times a day (QID) | ORAL | Status: DC

## 2013-09-24 MED ORDER — OXYCODONE HCL 5 MG PO TABS: 5.0000 mg | ORAL_TABLET | Freq: Once | ORAL | Status: DC

## 2013-09-24 MED ORDER — CLINIMIX E/DEXTROSE (5/15) 5 % IV SOLN
INTRAVENOUS | Status: DC
  Administered 2013-09-24: 17:00:00 via INTRAVENOUS
  Filled 2013-09-24: qty 2000

## 2013-09-24 NOTE — Progress Notes (Signed)
[facility] PROGRESS NOTE  [name] [id number] DOB: 16-Mar-1971 DOA: 09/13/2013 PCP: [name], MD  Assessment/Plan: 1. Enteral feeding. Patient with long-standing history of poorly controlled diabetes mellitus, diabetic gastroparesis, on enteral feeds. Given issues with G-J tube, with multiple G tube and G-J tubes in her past, Gen. surgery was consulted for J-tube placement. [name] felt that J-tube placement was not a good solution, and had multiple associated complications. Interventional radiology was consulted for GJ Tube replacement, s/p tube placement. We had attempted to resume tube feeds multiple times, unfortunately pt does not seem to tolerate tubefeeds, even at a slower rate. Abd xray without signs of obstruction. GI following with recs for cont supportive care. 2. Diabetic gastroparesis. Patient having multiple hospitalizations do to diabetic gastroparesis. She currently sees [name] at [facility]. On scheduled reglan TID. [name] recommended consulting interventional radiology with temporizing GJ tube. He also recommended addressing her pain. With regard to gastric pacemaker implant he stated that this would not help with pain and would need to be addressed in the outpatient setting as this can take months to get approved. [name] did not feel that a transfer was necessary as the GI service there would not have anything to add. Nutrition is following to assist with resuming gastric tube feeds with plans to hopefully stop TPN eventually. She still remains on TPN 3. C diff Colitis. Patient stool, back positive for C. difficile as she was started on Flagyl 500 mg IV q. 8 hours. (started 2/18). Expected end date: 2/28 4. Type 2 diabetes mellitus, poorly controlled, continue Lantus 10 units subcutaneous daily with Accu-Cheks and sliding scale coverage. Blood sugars appear better controlled today. She is presently n.p.o. 5. Hypertension. Will continue PRN Labetolol  and Hydralazine. 6. Hematemesis. Patient having an episode of hematemesis for which GI was consulted. She underwent upper endoscopy which did not reveal active bleeding or significant blood within the stomach. Noted mild to moderate erosive esophagitis likely related to emesis. Continue PPI therapy.  7. Gastroesophageal reflux disease. PPI therapy 8. DVT prophylaxis. SCDs  Code Status: Full Family Communication: Pt in room (indicate person spoken with, relationship, and if by phone, the number) Disposition Plan: Pending   Consultants:  GI  Surgery  IR  Procedures:  G-J tube replacement by IR 09/18/13  Antibiotics:  Flagyl 09/18/13>>>  HPI/Subjective: No acute events. Remains on TPN.   Objective: Filed Vitals:   09/23/13 0810 09/23/13 1352 09/23/13 2044 09/24/13 0437  BP:  154/96 154/86 131/76  Pulse:  89 93 82  Temp:  98.9 F (37.2 C) 99.7 F (37.6 C) 98.6 F (37 C)  TempSrc:  Oral Oral Oral  Resp:  20 20 20   Height:      Weight:    71.532 kg (157 lb 11.2 oz)  SpO2: 100% 100% 99% 100%    Intake/Output Summary (Last 24 hours) at 09/24/13 0848 Last data filed at 09/24/13 0438  Gross per 24 hour  Intake 743.67 ml  Output   3400 ml  Net -2656.33 ml   Filed Weights   09/22/13 0404 09/23/13 0500 09/24/13 0437  Weight: 70.806 kg (156 lb 1.6 oz) 69 kg (152 lb 1.9 oz) 71.532 kg (157 lb 11.2 oz)    Exam:   General:  Awake, in nad  Cardiovascular: regular, s1, s2  Respiratory: normal resp effort, no wheezing  Abdomen: soft, nondistended, feeding tube in place  Musculoskeletal: perfused, no clubbing   Data Reviewed: Basic Metabolic Panel:  Recent Labs  Lab 09/18/13 0550 09/19/13 0645 09/20/13 0510 09/21/13 0331 09/22/13 0500 09/23/13 0400 09/24/13 0305  NA 138 139 135* 134* 139 138 135*  K 3.2* 3.4* 3.5* 3.5* 4.0 3.6* 3.6*  CL 102 103 100 99 106 106 102  CO2 25 23 24 23 23 23 22   GLUCOSE 178* 164* 124* 153* 151* 140* 162*  BUN 4* 11 10 12 11 13  14   CREATININE 0.61 0.64 0.64 0.64 0.58 0.54 0.60  CALCIUM 8.6 8.7 8.5 8.4 8.3* 8.3* 8.4  MG 1.2* 1.4* 1.6  --   --  1.3* 1.6  PHOS 3.8 4.1  --   --   --  3.9  --    Liver Function Tests:  Recent Labs Lab 09/18/13 0550 09/19/13 0645 09/23/13 0400  AST 15 13 18   ALT 8 6 7   ALKPHOS 83 80 75  BILITOT <0.2* <0.2* <0.2*  PROT 6.6 6.5 6.0  ALBUMIN 2.4* 2.4* 2.1*   No results found for this basename: LIPASE, AMYLASE,  in the last 168 hours No results found for this basename: AMMONIA,  in the last 168 hours CBC:  Recent Labs Lab 09/18/13 0550 09/23/13 0400  WBC 4.2 4.0  NEUTROABS 2.2 1.2*  HGB 9.3* 8.6*  HCT 28.2* 26.6*  MCV 87.3 87.5  PLT 178 139*   Cardiac Enzymes: No results found for this basename: CKTOTAL, CKMB, CKMBINDEX, TROPONINI,  in the last 168 hours BNP (last 3 results) No results found for this basename: PROBNP,  in the last 8760 hours CBG:  Recent Labs Lab 09/23/13 1655 09/23/13 2041 09/23/13 2339 09/24/13 0434 09/24/13 0757  GLUCAP 144* 157* 87 150* 159*    Recent Results (from the past 240 hour(s))  CLOSTRIDIUM DIFFICILE BY PCR     Status: Abnormal   Collection Time    09/15/13  2:32 PM      Result Value Ref Range Status   C difficile by pcr POSITIVE (*) NEGATIVE Final   Comment: CRITICAL RESULT CALLED TO, READ BACK BY AND VERIFIED WITH:     CRAVEN,M RN 09/15/13 2007 [place]     Performed at [facility]     Studies: No results found.  Scheduled Meds: . acetaminophen  650 mg Per Tube TID  . atorvastatin  40 mg Per Tube q1800  . busPIRone  5 mg Per Tube TID  . famotidine (PEPCID) IV  20 mg Intravenous Q12H  . gabapentin  600 mg Per Tube 3 times per day  . insulin aspart  0-20 Units Subcutaneous 6 times per day  . insulin glargine  10 Units Subcutaneous QHS  . metoCLOPramide (REGLAN) injection  10 mg Intravenous 4 times per day  . metronidazole  500 mg Intravenous Q8H  . mometasone-formoterol  2 puff Inhalation BID  . sodium  chloride  3 mL Intravenous Q12H  . sucralfate  1 g Per Tube TID WC & HS   Continuous Infusions: . sodium chloride 20 mL/hr at 09/18/13 1700  . Marland KitchenTPN (CLINIMIX-E) Adult 75 mL/hr at 09/23/13 1728   And  . fat emulsion 250 mL (09/23/13 1728)    Active Problems:   Intractable nausea and vomiting   Dyslipidemia   GERD (gastroesophageal reflux disease)   Hypertension   DM (diabetes mellitus), type 2, uncontrolled   Dehydration   Hyponatremia   Leukocytosis, unspecified   Abdominal pain   Diabetic neuropathy   Hyperglycemia   Hypochloremia   Hemoptysis   Diabetic gastroparesis  Time spent: 55min  CHIU, STEPHEN  K  [facility] Pager [phone]. If 7PM-7AM, please contact night-coverage at [email or web address], password [facility] 09/24/2013, 8:48 AM  LOS: 11 days

## 2013-09-24 NOTE — Discharge Summary (Addendum)
Physician Discharge Summary  [name] [id number] DOB: 01-28-1971 DOA: 09/13/2013  PCP: Martha Merino, MD  Admit date: 09/13/2013 Discharge date: 09/25/13  Time spent: 35 minutes  Recommendations for Outpatient Follow-up:  1. Follow up with [name] at [name] as scheduled at [name] on 09/25/13 2. Follow up with Pain Clinic as scheduled 3. Follow up with PCP in 1-2 weeks  Discharge Diagnoses:  Active Problems:   Intractable nausea and vomiting   Dyslipidemia   GERD (gastroesophageal reflux disease)   Hypertension   DM (diabetes mellitus), type 2, uncontrolled   Dehydration   Hyponatremia   Leukocytosis, unspecified   Abdominal pain   Diabetic neuropathy   Hyperglycemia   Hypochloremia   Hemoptysis   Diabetic gastroparesis   Discharge Condition: Stable  Diet recommendation: TPN  Filed Weights   09/22/13 0404 09/23/13 0500 09/24/13 0437  Weight: 70.806 kg (156 lb 1.6 oz) 69 kg (152 lb 1.9 oz) 71.532 kg (157 lb 11.2 oz)    History of present illness:  The patient is a 43 y.o. year-old female with history of T1DM with peripheral neuropathy and diabetic gastroparesis status post gastrostomy tube placement, coronary artery disease status post MI and stent placement, hypertension, hyperlipidemia, COPD who presents with nausea, vomiting , and abdominal pain. The patient has been hospitalized twice in the last month and half, and this is her third admission. The patient was last at their baseline health about 4 days ago. She states that she started developing nausea with vomiting, occurring approximately every hour with burgundy blood intermittently. She denies bilious emesis. Her last BM was soft, not watery, and was last night. She has also had some associated upper abdominal pain that is worse with vomiting and some lower chest pain which is also exacerbated by vomiting. She denies shortness of breath but has had a mild cough. Denies sinus congestion rhinorrhea, dysuria. She  states that she was unable to take her medications this morning but she did take her long-acting insulin last night. She came to the emergency department because she felt she was getting hydrated.  In the emergency department, vital signs were notable for a blood pressure of up to 219/127 which came down spontaneously to 182/104, heart rate in the 110s to 120s, afebrile breathing comfortably on room air. Her labs were notable for white blood cell count of 16.5, sodium 129, chloride 84, glucose 489. Chest and abdominal x-rays were notable for gastrostomy catheter which was looped into the mid to distal esophagus with the tip lying within the stomach, and no pneumonia or bowel abnormalities. She had an ultrasound-guided ID placed in the emergency department. She was given IV Protonix, normal saline bolus, metoprolol, Reglan, and insulin in the emergency department.   Hospital Course:  1. Enteral feeding. Patient with long-standing history of poorly controlled diabetes mellitus, diabetic gastroparesis, on enteral feeds. Given issues with G-J tube, with multiple G tube and G-J tubes in her past, Gen. surgery was consulted for J-tube placement. [name] felt that J-tube placement was not a good solution, and had multiple associated complications. Interventional radiology was consulted for GJ Tube replacement, s/p tube placement. We had attempted to resume tube feeds multiple times, unfortunately pt does not seem to tolerate tubefeeds, even at a slower rate. Abd xray without signs of obstruction. GI was following with recs for cont supportive care and cont TPN on discharge. 2. Diabetic gastroparesis. Patient having multiple hospitalizations do to diabetic gastroparesis. She currently sees [name] at [name]. On scheduled reglan TID. [name] recommended consulting interventional radiology with temporizing GJ tube. He also recommended addressing her pain. With regard to gastric pacemaker implant he  stated that this would not help with pain and would need to be addressed in the outpatient setting as this can take months to get approved. [name] did not feel that a transfer was necessary as the GI service there would not have anything to add. Nutrition is following to assist with resuming gastric tube feeds with plans to hopefully stop TPN eventually. She still remains on TPN, to be continued on discharge 3. C diff Colitis. Patient stool, back positive for C. difficile as she was started on Flagyl 500 mg IV q. 8 hours. (started 2/18). Will complete antibiotic course with vanc per tube on discharge. Expected end date: 2/28 4. Type 2 diabetes mellitus, poorly controlled, continued Lantus 10 units subcutaneous daily with Accu-Cheks and sliding scale coverage. Blood sugars have since been improved. She remains n.p.o. 5. Hypertension. While in-hospital, was cont on PRN Labetolol and Hydralazine. 6. Hematemesis. Patient having an episode of hematemesis for which GI was consulted. She underwent upper endoscopy which did not reveal active bleeding or significant blood within the stomach. Noted mild to moderate erosive esophagitis likely related to emesis. Continue PPI therapy.  7. Gastroesophageal reflux disease. PPI therapy 8. DVT prophylaxis. Was continued on SCDs  Procedures: G-J tube replacement by IR 09/18/13  Consultations:  GI  IR  General Surgery  Discharge Exam: Filed Vitals:   09/23/13 2044 09/24/13 0437 09/24/13 0852 09/24/13 1429  BP: 154/86 131/76  136/81  Pulse: 93 82  86  Temp: 99.7 F (37.6 C) 98.6 F (37 C)  98.4 F (36.9 C)  TempSrc: Oral Oral  Oral  Resp: 20 20  20   Height:      Weight:  71.532 kg (157 lb 11.2 oz)    SpO2: 99% 100% 98% 100%    General: Awake, in nad  Cardiovascular: regular, s1, s2 Respiratory: normal resp effort, no wheezing  Discharge Instructions    Medication List    STOP taking these medications       feeding supplement (VITAL AF 1.2  CAL) Liqd      TAKE these medications       acetaminophen 160 MG/5ML solution  Commonly known as:  TYLENOL  Place 20.3 mLs (650 mg total) into feeding tube 3 (three) times daily.     atorvastatin 40 MG tablet  Commonly known as:  LIPITOR  Place 1 tablet (40 mg total) into feeding tube daily at 6 PM.     busPIRone 5 MG tablet  Commonly known as:  BUSPAR  Place 5 mg into feeding tube 3 (three) times daily.     Fluticasone-Salmeterol 250-50 MCG/DOSE Aepb  Commonly known as:  ADVAIR  Inhale 1 puff into the lungs 2 (two) times daily.     gabapentin 250 MG/5ML solution  Commonly known as:  NEURONTIN  Place 12 mLs (600 mg total) into feeding tube every 8 (eight) hours.     insulin aspart 100 UNIT/ML injection  Commonly known as:  novoLOG  Inject 0-9 Units into the skin every 4 (four) hours. On sliding scale     insulin glargine 100 UNIT/ML injection  Commonly known as:  LANTUS  Inject 10 Units into the skin at bedtime.     labetalol 200 MG tablet  Commonly known as:  NORMODYNE  Place 1 tablet (200 mg total) into feeding tube 2 (two)  times daily.     lisinopril 10 MG tablet  Commonly known as:  PRINIVIL,ZESTRIL  Place 10 mg into feeding tube daily.     magnesium oxide 400 (241.3 MG) MG tablet  Commonly known as:  MAG-OX  Place 1 tablet (400 mg total) into feeding tube daily.     metoCLOPramide 5 MG/5ML solution  Commonly known as:  REGLAN  Take 10 mLs (10 mg total) by mouth 3 (three) times daily before meals.     mirtazapine 15 MG tablet  Commonly known as:  REMERON  Place 15 mg into feeding tube at bedtime.     multivitamin Liqd  Place 5 mLs into feeding tube daily.     oxyCODONE 5 MG immediate release tablet  Commonly known as:  Oxy IR/ROXICODONE  Take 1 tablet (5 mg total) by mouth every 4 (four) hours as needed for moderate pain.     pantoprazole sodium 40 mg/20 mL Pack  Commonly known as:  PROTONIX  Place 20 mLs (40 mg total) into feeding tube 2 (two) times  daily.     promethazine 25 MG tablet  Commonly known as:  PHENERGAN  Place 1 tablet (25 mg total) into feeding tube every 6 (six) hours as needed for nausea or vomiting.     sucralfate 1 GM/10ML suspension  Commonly known as:  CARAFATE  Place 10 mLs (1 g total) into feeding tube 4 (four) times daily -  with meals and at bedtime.     vancomycin 50 mg/mL oral solution  Commonly known as:  VANCOCIN  Place 2.5 mLs (125 mg total) into feeding tube every 6 (six) hours.     zolpidem 10 MG tablet  Commonly known as:  AMBIEN  Place 10 mg into feeding tube at bedtime.       Allergies  Allergen Reactions  . Cefadroxil Other (See Comments)    Not sure of effects-listed as allergy on list from home  . Compazine [Prochlorperazine Edisylate] Hives    Tolerates promethazine  . Darvocet [Propoxyphene N-Acetaminophen] Other (See Comments)    UNKNOWN  . Zofran [Ondansetron Hcl] Hives  . Nsaids Hives  . Penicillins Hives      The results of significant diagnostics from this hospitalization (including imaging, microbiology, ancillary and laboratory) are listed below for reference.    Significant Diagnostic Studies: Ir Gj Tube Change  09/18/2013   CLINICAL DATA:  43 year old female with a significant history of gastroparesis with chronic nausea and vomiting. She has a percutaneous gastrojejunostomy tube which she frequently regurgitates from its normal position in the proximal small bowel into her esophagus requiring manipulation under fluoroscopy to repositioned the tube in the proximal small bowel. This has happened on a near monthly basis over the past several months.  She presents today for repositioning of the gastrojejunostomy tube prior to discharge. She will then follow-up with her gastroenterologist at [facility] for additional medical management of her chronic nausea in an effort to reduce vomiting and displacement of the tube from the proximal small bowel into  the esophagus.  EXAM: JEJUNAL CATHETER REPLACEMENT  Date: 09/18/2013  TECHNIQUE: Informed consent was obtained from the patient following explanation of the procedure, risks, benefits and alternatives. The patient understands, agrees and consents for the procedure. All questions were addressed. A time out was performed.  Maximal barrier sterile technique utilized including caps, mask, sterile gowns, sterile gloves, large sterile drape, hand hygiene, and Betadine skin prep.  The balloon retention device of the  existing gastrostomy tube was deflated the tube removed. A new [employer] 63 French gastrostomy tube was then advanced through the mature stoma and into the stomach. The balloon was filled with 8 mL of dilute contrast material and pulled snug against the anterior abdominal wall. An angled catheter and Glidewire were then manipulated through the catheter and used to catheterize the distended duodenum. The catheter and Glidewire were ultimately advanced into the proximal jejunum. The Glidewire was then exchanged for an Amplatz wire. A new jejunal limb was then advanced over the wire and positioned with the tip in the distal horizontal duodenum at the junction of the third and fourth portions of the duodenum. Position was confirmed by gently hand injected contrast material and a spot fluoroscopic image was obtained. Due to the chronic distension and redundancy of the duodenum, this represents the maximum depth this tube can be advanced.  ANESTHESIA/SEDATION: None required  CONTRAST:  18mL OMNIPAQUE IOHEXOL 300 MG/ML  SOLN  FLUOROSCOPY TIME:  6 min 54 seconds  PROCEDURE: 1. Up size of existing 18 French gastrostomy tube to a 20 [place] gastrostomy tube 2. Placement of jejunal tube coaxially through the gastrostomy tube Interventional Radiologist:  Criselda Peaches, MD  IMPRESSION: 1. Up size of existing 18 French percutaneous gastrostomy tube for a new 6 French balloon retention gastrostomy tube. 2.  Successful placement of a J arm extension. The tip of the J is at the junction of the third and fourth portions of the duodenum. More distal placement of the J arm would require special ordering of a longer device secondary to the chronic dilatation and patulous nature of the duodenum. Patient will follow-up with her gastroenterologist for medical management of her chronic nausea and emesis. If her emesis cannot be controlled and she continues to regurgitate the tube into her esophagus, we could consider ordering in an extra length device (if one is commercially available) to advance the tip farther out into the jejunum.  Signed,  Criselda Peaches, MD  [facility]  [name]   Electronically Signed   By: Jacqulynn Cadet M.D.   On: 09/18/2013 14:39   Ir Fluoro Guide Cv Line Right  09/17/2013   CLINICAL DATA:  Gastroparesis, needs access for parenteral feeding support  EXAM: PICC PLACEMENT WITH ULTRASOUND AND FLUOROSCOPY  TECHNIQUE: After written informed consent was obtained, patient was placed in the supine position on angiographic table. Patency of the right tracheal vein was confirmed with ultrasound with image documentation. An appropriate skin site was determined. Skin site was marked. Region was prepped using maximum barrier technique including cap and mask, sterile gown, sterile gloves, large sterile sheet, and Chlorhexidine as cutaneous antisepsis. The region was infiltrated locally with 1% lidocaine. Under real-time ultrasound guidance, the right tracheal vein was accessed with a 21 gauge micropuncture needle; the needle tip within the vein was confirmed with ultrasound image documentation. Needle exchanged over a 018 guidewire for a peel-away sheath, through which a 5-French double-lumen power injectable PICC trimmed to 38cm was advanced, positioned with its tip near the cavoatrial junction. Spot chest radiograph confirms appropriate catheter position.  Catheter was flushed per protocol and secured externally with 0-Prolene sutures. The patient tolerated procedure well, with no immediate complication.  FLUOROSCOPY TIME:  6 seconds  IMPRESSION: Technically successful five [place] double lumen power injectable PICC placement   Electronically Signed   By: Arne Cleveland M.D.   On: 09/17/2013 11:48   Ir Gastric Tube Perc Chg W/o Img Guide  09/14/2013   CLINICAL DATA:  Reflux of indwelling gastrojejunal catheter into esophagus. Chronic gastric pierce is requiring gastric decompression.  EXAM: GASTROSTOMY TUBE EXCHANGE WITHOUT FLUOROSCOPY  CONTRAST:  None  FLUOROSCOPY TIME:  None  PROCEDURE: The pre-existing gastric jejunal catheter was removed after deflation of the retention balloon. A new 37 French balloon retention gastrostomy tube was placed through the tract. The retention balloon was inflated with saline.  COMPLICATIONS: None.  FINDINGS: Replacement of malpositioning gastric jejunal catheter for new 18 French balloon retention gastrostomy.  IMPRESSION: Replacement of malpositioning gastric jejunal catheter for new 18 French balloon retention gastrostomy.   Electronically Signed   By: Aletta Edouard M.D.   On: 09/14/2013 12:02   Ir US Guide Vasc Access Right  09/17/2013   CLINICAL DATA:  Gastroparesis, needs access for parenteral feeding support  EXAM: PICC PLACEMENT WITH ULTRASOUND AND FLUOROSCOPY  TECHNIQUE: After written informed consent was obtained, patient was placed in the supine position on angiographic table. Patency of the right tracheal vein was confirmed with ultrasound with image documentation. An appropriate skin site was determined. Skin site was marked. Region was prepped using maximum barrier technique including cap and mask, sterile gown, sterile gloves, large sterile sheet, and Chlorhexidine as cutaneous antisepsis. The region was infiltrated locally with 1% lidocaine. Under real-time ultrasound guidance, the right tracheal vein was  accessed with a 21 gauge micropuncture needle; the needle tip within the vein was confirmed with ultrasound image documentation. Needle exchanged over a 018 guidewire for a peel-away sheath, through which a 5-French double-lumen power injectable PICC trimmed to 38cm was advanced, positioned with its tip near the cavoatrial junction. Spot chest radiograph confirms appropriate catheter position. Catheter was flushed per protocol and secured externally with 0-Prolene sutures. The patient tolerated procedure well, with no immediate complication.  FLUOROSCOPY TIME:  6 seconds  IMPRESSION: Technically successful five [place] double lumen power injectable PICC placement   Electronically Signed   By: Arne Cleveland M.D.   On: 09/17/2013 11:48   Dg Chest Port 1 View  09/19/2013   CLINICAL DATA:  43 year old female with possible foreign body in GI tract. Initial encounter.  EXAM: PORTABLE CHEST - 1 VIEW  COMPARISON:  09/13/2013.  FINDINGS: Partially visible percutaneous gastrostomy tube hardware, but catheter tubing no longer visible in the distal thoracic esophagus.  Interval placement right PICC line.  Lung volumes stable and within normal limits. Allowing for portable technique, the lungs are clear. Normal cardiac size and mediastinal contours. There is increased gas in the cervical and upper thoracic esophagus.  IMPRESSION: 1. Catheter tubing no longer visible in the distal thoracic esophagus. 2.  No acute cardiopulmonary abnormality.   Electronically Signed   By: Lars Pinks M.D.   On: 09/19/2013 20:06   Dg Abd Acute W/chest  09/13/2013   CLINICAL DATA:  Cough and congestion  EXAM: ACUTE ABDOMEN SERIES (ABDOMEN 2 VIEW & CHEST 1 VIEW)  COMPARISON:  08/15/2013  FINDINGS: Cardiac shadow is within normal limits. The lungs are well aerated without focal infiltrate. The previously seen right-sided PICC line is been removed in the interval. The patient again shows a gastrostomy catheter with a small bowel extension although  the extension is now coiled within the mid to distal esophagus. Scattered large and small bowel gas is noted. Postoperative changes are again seen. No obstructive changes are noted.  IMPRESSION: Gastrostomy catheter with a small bowel extension which is looped within the mid to distal esophagus. The tip lies within the stomach.  No other focal abnormality is seen.   Electronically Signed   By: Inez Catalina M.D.   On: 09/13/2013 09:33   Dg Abd Portable 1v  09/22/2013   CLINICAL DATA:  Diffuse abdominal pain, nausea and vomiting.  EXAM: PORTABLE ABDOMEN - 1 VIEW  COMPARISON:  DG ABD ACUTE W/CHEST dated 09/13/2013; CT ABD/PELVIS W CM dated 08/15/2013  FINDINGS: The visualized bowel gas pattern is unremarkable. Scattered air and contrast filled loops of colon are seen; air-filled loops of small bowel are also noted, without evidence of abnormal dilatation to suggest small bowel obstruction. No free intra-abdominal air is identified, though evaluation for free air is limited on a single supine view. Clips are noted within the right upper quadrant, reflecting prior cholecystectomy. A GJ tube is noted ending at the third segment of the duodenum.  The visualized osseous structures are within normal limits; the sacroiliac joints are unremarkable in appearance. The patient is status post interbody fusion at L4-L5.  IMPRESSION: Unremarkable bowel gas pattern; no free intra-abdominal air seen. Contrast progresses to the descending and sigmoid colon.   Electronically Signed   By: Garald Balding M.D.   On: 09/22/2013 01:49    Microbiology: Recent Results (from the past 240 hour(s))  CLOSTRIDIUM DIFFICILE BY PCR     Status: Abnormal   Collection Time    09/15/13  2:32 PM      Result Value Ref Range Status   C difficile by pcr POSITIVE (*) NEGATIVE Final   Comment: CRITICAL RESULT CALLED TO, READ BACK BY AND VERIFIED WITH:     CRAVEN,M RN 09/15/13 2007 [place]     Performed at [place]: Basic  Metabolic Panel:  Recent Labs Lab 09/18/13 0550 09/19/13 0645 09/20/13 0510 09/21/13 0331 09/22/13 0500 09/23/13 0400 09/24/13 0305  NA 138 139 135* 134* 139 138 135*  K 3.2* 3.4* 3.5* 3.5* 4.0 3.6* 3.6*  CL 102 103 100 99 106 106 102  CO2 25 23 24 23 23 23 22   GLUCOSE 178* 164* 124* 153* 151* 140* 162*  BUN 4* 11 10 12 11 13 14   CREATININE 0.61 0.64 0.64 0.64 0.58 0.54 0.60  CALCIUM 8.6 8.7 8.5 8.4 8.3* 8.3* 8.4  MG 1.2* 1.4* 1.6  --   --  1.3* 1.6  PHOS 3.8 4.1  --   --   --  3.9  --    Liver Function Tests:  Recent Labs Lab 09/18/13 0550 09/19/13 0645 09/23/13 0400  AST 15 13 18   ALT 8 6 7   ALKPHOS 83 80 75  BILITOT <0.2* <0.2* <0.2*  PROT 6.6 6.5 6.0  ALBUMIN 2.4* 2.4* 2.1*   No results found for this basename: LIPASE, AMYLASE,  in the last 168 hours No results found for this basename: AMMONIA,  in the last 168 hours CBC:  Recent Labs Lab 09/18/13 0550 09/23/13 0400  WBC 4.2 4.0  NEUTROABS 2.2 1.2*  HGB 9.3* 8.6*  HCT 28.2* 26.6*  MCV 87.3 87.5  PLT 178 139*   Cardiac Enzymes: No results found for this basename: CKTOTAL, CKMB, CKMBINDEX, TROPONINI,  in the last 168 hours BNP: BNP (last 3 results) No results found for this basename: PROBNP,  in the last 8760 hours CBG:  Recent Labs Lab 09/23/13 2041 09/23/13 2339 09/24/13 0434 09/24/13 0757 09/24/13 1153  GLUCAP 157* 87 150* 159* 180*    Signed:  Sanjuanita Condrey K  [facility] 09/24/2013, 3:41 PM

## 2013-09-24 NOTE — Progress Notes (Signed)
PARENTERAL NUTRITION CONSULT NOTE - Follow Up  Pharmacy Consult for TNA Indication: intolerance to enteral feeds  Allergies  Allergen Reactions  . Cefadroxil Other (See Comments)    Not sure of effects-listed as allergy on list from home  . Compazine [Prochlorperazine Edisylate] Hives    Tolerates promethazine  . Darvocet [Propoxyphene N-Acetaminophen] Other (See Comments)    UNKNOWN  . Zofran [Ondansetron Hcl] Hives  . Nsaids Hives  . Penicillins Hives   Patient Measurements: Height: 5\' 4"  (162.6 cm) Weight: 157 lb 11.2 oz (71.532 kg) IBW/kg (Calculated) : 54.7  Vital Signs: Temp: 98.6 F (37 C) (02/24 0437) Temp src: Oral (02/24 0437) BP: 131/76 mmHg (02/24 0437) Pulse Rate: 82 (02/24 0437) Intake/Output from previous day: 02/23 0701 - 02/24 0700 In: 743.7 [I.V.:393.7; IV Piggyback:350] Out: 3400 [Urine:3400]  Labs:  Recent Labs  09/23/13 0400  WBC 4.0  HGB 8.6*  HCT 26.6*  PLT 139*     Recent Labs  09/22/13 0500 09/23/13 0400 09/24/13 0305  NA 139 138 135*  K 4.0 3.6* 3.6*  CL 106 106 102  CO2 23 23 22   GLUCOSE 151* 140* 162*  BUN 11 13 14   CREATININE 0.58 0.54 0.60  CALCIUM 8.3* 8.3* 8.4  MG  --  1.3* 1.6  PHOS  --  3.9  --   PROT  --  6.0  --   ALBUMIN  --  2.1*  --   AST  --  18  --   ALT  --  7  --   ALKPHOS  --  75  --   BILITOT  --  <0.2*  --   PREALBUMIN  --  17.8*  --   TRIG  --  165*  --    Estimated Creatinine Clearance: 87.9 ml/min (by C-G formula based on Cr of 0.6).    Recent Labs  09/23/13 2339 09/24/13 0434 09/24/13 0757  GLUCAP 87 150* 159*   CBGs & Insulin requirements past 24 hours:  - CBGs 87-159, received 14 units of Novolog resistant SSI q4h  - Lantus 10 units qhs  Assessment:  16 yoF with h/o pancreatitis, DM with diabetic gastroparesis s/p J-tube placement with multiple issues presented 2/13 with N/V/abd pain. The J-tube was removed and a new G-tube was placed. Patient unable to tolerate intragastric  feedings given N/V/abd pain. Patient also found with C.difficile infection this admission (on Flagyl). Unable to transfer patient to [facility] as MDs there cannot offer more options. GI, surgery, IR on board - plan GJ tube replacement. In the meantime, IR placced PICC and start TNA.   2/24: D#8 TPN, GJ  tube re-placed by IR 2/18.  TF attempted resume 2/20 & 2/21 but unable to tolerate with increase abd pain.  Patient still having chronic abdominal pain.  Abd xray without signs of obstruction.  GI notes to continue TNA for now and unknown at this time when will retry tube feeds.  Nutritional Goals:  - RD recs 2/17: 1600-1800 Kcal/day, 80-95 g protein/day, 2.1L fluid per day - Clinimix E 5/15 @ 75 ml/hr + IVF 20% at 10 ml/hr will provide: 90 g protein/day, 1758 Kcal/day, 1.8 L/day  Current nutrition:  - Diet: NPO starting 2/13 - TNA:  started 2/17 PM   - mIVF: NS @ 20 ml/hr  Labs: Electrolytes:  Na 135, K+ 3.6. Mag WNL after replacement yesterday.  K+ remained 3.6 despite 2 runs of KCl yesterday. Renal Function: Scr wnl/stable, good UOP Hepatic Function: WNL Pre-Albumin: 17.7 (  2/18), 17.8 (2/23) Triglycerides: 165 CBGs: H/o uncontrolled DM on Novolog SSI q4h and Lantus 10 units qhs PTA (both resumed inpatient). Couple of hypoglycemic events few days ago - resolved.  CBGs ok today - 2 readings slightly above goal (<150 mg/dL).  Plan:  At 1800 tonight  Continue Clinimix E 5/15 at goal rate of 75 ml/hr since endpoint of changing off of TNA and retrying tube feeds not known per GI.  IV fat emulsion 20% at 10 ml/hr daily  IV multivitamin daily provided in TNA.  Trace elements provided MWF due to national shortage.  Continue Novolog SSI to resistant scale q4h.  KCl 10 mEq/100 mL IV x 2 runs.  BMET in AM.  Continue Lantus 10 units qhs   TNA labs Monday/Thursdays.  Hershal Coria, PharmD, BCPS Pager: [phone] 09/24/2013 8:44 AM

## 2013-09-25 LAB — BASIC METABOLIC PANEL
BUN: 14 mg/dL (ref 6–23)
CALCIUM: 8.6 mg/dL (ref 8.4–10.5)
CO2: 23 mEq/L (ref 19–32)
CREATININE: 0.56 mg/dL (ref 0.50–1.10)
Chloride: 101 mEq/L (ref 96–112)
GFR calc Af Amer: >90 mL/min (ref >90)
GFR calc non Af Amer: >90 mL/min (ref >90)
GLUCOSE: 178 mg/dL — AB (ref 70–99)
Potassium: 3.7 mEq/L (ref 3.7–5.3)
Sodium: 135 mEq/L — ABNORMAL LOW (ref 137–147)

## 2013-09-25 LAB — GLUCOSE, CAPILLARY
GLUCOSE-CAPILLARY: 166 mg/dL — AB (ref 70–99)
Glucose-Capillary: 165 mg/dL — ABNORMAL HIGH (ref 70–99)
Glucose-Capillary: 171 mg/dL — ABNORMAL HIGH (ref 70–99)

## 2013-09-25 MED ORDER — VANCOMYCIN 50 MG/ML ORAL SOLUTION: 125.0000 mg | Freq: Four times a day (QID) | ORAL | Status: DC

## 2013-09-25 NOTE — Progress Notes (Signed)
Patient briefly seen and examined. OK to DC home today. DC Summary and med rec done by Dr. Wyline Copas 2/24.  Domingo Mend, MD [facility] Pager: [phone]

## 2013-09-26 ENCOUNTER — Encounter (HOSPITAL_COMMUNITY): Payer: Self-pay | Admitting: Emergency Medicine

## 2013-09-26 ENCOUNTER — Emergency Department (HOSPITAL_COMMUNITY)
Admission: EM | Admit: 2013-09-26 | Discharge: 2013-09-27 | Disposition: A | Discharge status: Home / Self Care | Payer: Medicare Other | Attending: Emergency Medicine | Admitting: Emergency Medicine

## 2013-09-26 DIAGNOSIS — I10 Essential (primary) hypertension: Secondary | ICD-10-CM | POA: Insufficient documentation

## 2013-09-26 DIAGNOSIS — Z90710 Acquired absence of both cervix and uterus: Secondary | ICD-10-CM | POA: Insufficient documentation

## 2013-09-26 DIAGNOSIS — G8929 Other chronic pain: Secondary | ICD-10-CM | POA: Insufficient documentation

## 2013-09-26 DIAGNOSIS — J4489 Other specified chronic obstructive pulmonary disease: Secondary | ICD-10-CM | POA: Insufficient documentation

## 2013-09-26 DIAGNOSIS — R109 Unspecified abdominal pain: Secondary | ICD-10-CM | POA: Insufficient documentation

## 2013-09-26 DIAGNOSIS — E1049 Type 1 diabetes mellitus with other diabetic neurological complication: Secondary | ICD-10-CM | POA: Insufficient documentation

## 2013-09-26 DIAGNOSIS — I2510 Atherosclerotic heart disease of native coronary artery without angina pectoris: Secondary | ICD-10-CM | POA: Insufficient documentation

## 2013-09-26 DIAGNOSIS — E785 Hyperlipidemia, unspecified: Secondary | ICD-10-CM | POA: Insufficient documentation

## 2013-09-26 DIAGNOSIS — I252 Old myocardial infarction: Secondary | ICD-10-CM | POA: Insufficient documentation

## 2013-09-26 DIAGNOSIS — R000 Tachycardia, unspecified: Secondary | ICD-10-CM | POA: Insufficient documentation

## 2013-09-26 DIAGNOSIS — Z3202 Encounter for pregnancy test, result negative: Secondary | ICD-10-CM | POA: Insufficient documentation

## 2013-09-26 DIAGNOSIS — K219 Gastro-esophageal reflux disease without esophagitis: Secondary | ICD-10-CM | POA: Insufficient documentation

## 2013-09-26 DIAGNOSIS — R61 Generalized hyperhidrosis: Secondary | ICD-10-CM | POA: Insufficient documentation

## 2013-09-26 DIAGNOSIS — R112 Nausea with vomiting, unspecified: Secondary | ICD-10-CM | POA: Insufficient documentation

## 2013-09-26 DIAGNOSIS — Z794 Long term (current) use of insulin: Secondary | ICD-10-CM | POA: Insufficient documentation

## 2013-09-26 DIAGNOSIS — Z880 Allergy status to penicillin: Secondary | ICD-10-CM | POA: Insufficient documentation

## 2013-09-26 DIAGNOSIS — G40909 Epilepsy, unspecified, not intractable, without status epilepticus: Secondary | ICD-10-CM | POA: Insufficient documentation

## 2013-09-26 DIAGNOSIS — E1142 Type 2 diabetes mellitus with diabetic polyneuropathy: Secondary | ICD-10-CM | POA: Insufficient documentation

## 2013-09-26 DIAGNOSIS — R0682 Tachypnea, not elsewhere classified: Secondary | ICD-10-CM | POA: Insufficient documentation

## 2013-09-26 DIAGNOSIS — Z87891 Personal history of nicotine dependence: Secondary | ICD-10-CM | POA: Insufficient documentation

## 2013-09-26 DIAGNOSIS — Z79899 Other long term (current) drug therapy: Secondary | ICD-10-CM | POA: Insufficient documentation

## 2013-09-26 DIAGNOSIS — Z9861 Coronary angioplasty status: Secondary | ICD-10-CM | POA: Insufficient documentation

## 2013-09-26 DIAGNOSIS — J449 Chronic obstructive pulmonary disease, unspecified: Secondary | ICD-10-CM | POA: Insufficient documentation

## 2013-09-26 DIAGNOSIS — Z9089 Acquired absence of other organs: Secondary | ICD-10-CM | POA: Insufficient documentation

## 2013-09-26 LAB — URINALYSIS, ROUTINE W REFLEX MICROSCOPIC
Bilirubin Urine: NEGATIVE
Glucose, UA: 500 mg/dL — AB
Ketones, ur: NEGATIVE mg/dL
Leukocytes, UA: NEGATIVE
Nitrite: NEGATIVE
Protein, ur: >300 mg/dL — AB
Specific Gravity, Urine: 1.016 (ref 1.005–1.030)
Urobilinogen, UA: 0.2 mg/dL (ref 0.0–1.0)
pH: 6.5 (ref 5.0–8.0)

## 2013-09-26 LAB — CBC WITH DIFFERENTIAL/PLATELET
Basophils Absolute: 0 10*3/uL (ref 0.0–0.1)
Basophils Relative: 0 % (ref 0–1)
Eosinophils Absolute: 0.2 10*3/uL (ref 0.0–0.7)
Eosinophils Relative: 2 % (ref 0–5)
HCT: 31.8 % — ABNORMAL LOW (ref 36.0–46.0)
Hemoglobin: 10.5 g/dL — ABNORMAL LOW (ref 12.0–15.0)
Lymphocytes Relative: 37 % (ref 12–46)
Lymphs Abs: 3.5 10*3/uL (ref 0.7–4.0)
MCH: 28 pg (ref 26.0–34.0)
MCHC: 33 g/dL (ref 30.0–36.0)
MCV: 84.8 fL (ref 78.0–100.0)
Monocytes Absolute: 0.5 10*3/uL (ref 0.1–1.0)
Monocytes Relative: 6 % (ref 3–12)
Neutro Abs: 5.2 10*3/uL (ref 1.7–7.7)
Neutrophils Relative %: 56 % (ref 43–77)
Platelets: 231 10*3/uL (ref 150–400)
RBC: 3.75 MIL/uL — ABNORMAL LOW (ref 3.87–5.11)
RDW: 14.9 % (ref 11.5–15.5)
WBC: 9.4 10*3/uL (ref 4.0–10.5)

## 2013-09-26 LAB — BLOOD GAS, ARTERIAL
ACID-BASE EXCESS: 0.5 mmol/L (ref 0.0–2.0)
Bicarbonate: 24.2 mEq/L — ABNORMAL HIGH (ref 20.0–24.0)
DRAWN BY: 257701
O2 Saturation: 86.5 %
PCO2 ART: 37.7 mmHg (ref 35.0–45.0)
PH ART: 7.424 (ref 7.350–7.450)
Patient temperature: 98.6
TCO2: 21.6 mmol/L (ref 0–100)
pO2, Arterial: 53.1 mmHg — ABNORMAL LOW (ref 80.0–100.0)

## 2013-09-26 LAB — COMPREHENSIVE METABOLIC PANEL
ALT: 13 U/L (ref 0–35)
AST: 21 U/L (ref 0–37)
Albumin: 2.8 g/dL — ABNORMAL LOW (ref 3.5–5.2)
Alkaline Phosphatase: 105 U/L (ref 39–117)
BUN: 17 mg/dL (ref 6–23)
CO2: 22 mEq/L (ref 19–32)
Calcium: 9.3 mg/dL (ref 8.4–10.5)
Chloride: 98 mEq/L (ref 96–112)
Creatinine, Ser: 0.53 mg/dL (ref 0.50–1.10)
GFR calc Af Amer: >90 mL/min (ref >90)
GFR calc non Af Amer: >90 mL/min (ref >90)
Glucose, Bld: 293 mg/dL — ABNORMAL HIGH (ref 70–99)
Potassium: 3.4 mEq/L — ABNORMAL LOW (ref 3.7–5.3)
Sodium: 136 mEq/L — ABNORMAL LOW (ref 137–147)
Total Bilirubin: <0.2 mg/dL — ABNORMAL LOW (ref 0.3–1.2)
Total Protein: 7.5 g/dL (ref 6.0–8.3)

## 2013-09-26 LAB — URINE MICROSCOPIC-ADD ON

## 2013-09-26 LAB — LIPASE, BLOOD: Lipase: 23 U/L (ref 11–59)

## 2013-09-26 LAB — POC URINE PREG, ED: PREG TEST UR: NEGATIVE

## 2013-09-26 MED ORDER — HYDROMORPHONE HCL PF 2 MG/ML IJ SOLN
2.0000 mg | Freq: Once | INTRAMUSCULAR | Status: AC
  Administered 2013-09-26: 2 mg via INTRAVENOUS
  Filled 2013-09-26: qty 1

## 2013-09-26 MED ORDER — HYDROMORPHONE HCL PF 1 MG/ML IJ SOLN
1.0000 mg | Freq: Once | INTRAMUSCULAR | Status: AC
  Administered 2013-09-26: 1 mg via INTRAVENOUS
  Filled 2013-09-26: qty 1

## 2013-09-26 MED ORDER — SODIUM CHLORIDE 0.9 % IV SOLN
1000.0000 mL | INTRAVENOUS | Status: DC
  Administered 2013-09-26: 1000 mL via INTRAVENOUS

## 2013-09-26 MED ORDER — PROMETHAZINE HCL 25 MG/ML IJ SOLN
25.0000 mg | Freq: Once | INTRAMUSCULAR | Status: AC
  Administered 2013-09-26: 25 mg via INTRAVENOUS
  Filled 2013-09-26: qty 1

## 2013-09-26 MED ORDER — SODIUM CHLORIDE 0.9 % IV SOLN
1000.0000 mL | Freq: Once | INTRAVENOUS | Status: AC
  Administered 2013-09-26: 1000 mL via INTRAVENOUS

## 2013-09-26 MED ORDER — FENTANYL CITRATE 0.05 MG/ML IJ SOLN
50.0000 ug | Freq: Once | INTRAMUSCULAR | Status: AC
  Administered 2013-09-26: 50 ug via INTRAVENOUS
  Filled 2013-09-26: qty 2

## 2013-09-26 MED ORDER — OXYCODONE HCL 5 MG PO TABS
5.0000 mg | ORAL_TABLET | Freq: Once | ORAL | Status: AC
  Administered 2013-09-26: 5 mg via ORAL
  Filled 2013-09-26: qty 1

## 2013-09-26 NOTE — ED Notes (Signed)
[facility] has been called for transportation

## 2013-09-26 NOTE — ED Provider Notes (Addendum)
CSN: [id number]     Arrival date & time 09/26/13  1607 History   First MD Initiated Contact with Patient 09/26/13 1611     Chief Complaint  Patient presents with  . Abdominal Pain  . Nausea     HPI  Patient presents with concerns of ongoing nausea, vomiting, abdominal pain.  Symptoms began several days ago, worse with today. Patient saw her physician yesterday, notes that she was not better on dismissal. Patient's primary care physician is at [facility]. Since onset symptoms of been persistent, with no relief from anything.  Patient is intolerant of the medication and feels. She denies fever, chest pain, does endorse mild dyspnea.    Past Medical History  Diagnosis Date  . Hypertension   . 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 Gastroparesis due to DM   . GERD (gastroesophageal reflux disease)   . Coronary artery disease   . Diabetic neuropathy, painful   . Chronic pancreatitis   . Dyslipidemia   . MI (myocardial infarction)   . COPD (chronic obstructive pulmonary disease)   . Seizures   . Diverticulosis     s/p partial bowel resection   Past Surgical History  Procedure Laterality Date  . Abdominal hysterectomy    . Cholecystectomy    . Peg tube x 4      feeding jejunostomies with PEG tubes  . Cesarean section    . Colon resection due to diverticulitis    . Back surgery    . Esophagogastroduodenoscopy N/A 12/27/2012    Procedure: ESOPHAGOGASTRODUODENOSCOPY (EGD);  Surgeon: Missy Sabins, MD;  Location: [facility];  Service: Endoscopy;  Laterality: N/A;  . Esophagogastroduodenoscopy (egd) with esophageal dilation N/A 02/19/2013    Procedure: ESOPHAGOGASTRODUODENOSCOPY (EGD) WITH ESOPHAGEAL DILATION;  Surgeon: Wonda Horner, MD;  Location: [facility];  Service: Endoscopy;  Laterality: N/A;  . Balloon dilation N/A 02/19/2013    Procedure: BALLOON DILATION;  Surgeon: Wonda Horner, MD;  Location: [facility];  Service: Endoscopy;  Laterality: N/A;   . Coronary stent placement      2 stents [place]  . Esophagogastroduodenoscopy N/A 09/13/2013    Procedure: ESOPHAGOGASTRODUODENOSCOPY (EGD);  Surgeon: Cleotis Nipper, MD;  Location: [facility];  Service: Endoscopy;  Laterality: N/A;   Family History  Problem Relation Age of Onset  . Endometrial cancer Mother   . Diabetes Mellitus II Maternal Grandmother   . Diabetes Mellitus II Mother   . Diabetes Mellitus II Sister   . Emphysema Mother   . Heart attack Father    History  Substance Use Topics  . Smoking status: Former Smoker -- 0.25 packs/day for 6 years    Types: Cigarettes, Cigars    Quit date: 08/01/2013  . Smokeless tobacco: Never Used  . Alcohol Use: No   OB History   Grav Para Term Preterm Abortions TAB SAB Ect Mult Living                 Review of Systems  Constitutional:       Per HPI, otherwise negative  HENT:       Per HPI, otherwise negative  Respiratory:       Per HPI, otherwise negative  Cardiovascular:       Per HPI, otherwise negative  Gastrointestinal: Positive for vomiting and abdominal pain.  Endocrine:       Negative aside from HPI  Genitourinary:       Neg aside from HPI   Musculoskeletal:  Per HPI, otherwise negative  Skin: Negative.   Neurological: Negative for syncope.      Allergies  Cefadroxil; Compazine; Darvocet; Zofran; Nsaids; and Penicillins  Home Medications   Current Outpatient Rx  Name  Route  Sig  Dispense  Refill  . atorvastatin (LIPITOR) 40 MG tablet   Per Tube   Place 1 tablet (40 mg total) into feeding tube daily at 6 PM.   30 tablet   0   . busPIRone (BUSPAR) 5 MG tablet   Per Tube   Place 5 mg into feeding tube 3 (three) times daily.         . Fluticasone-Salmeterol (ADVAIR) 250-50 MCG/DOSE AEPB   Inhalation   Inhale 1 puff into the lungs 2 (two) times daily.   60 each   0   . gabapentin (NEURONTIN) 250 MG/5ML solution   Per Tube   Place 12 mLs (600 mg total) into feeding tube every 8  (eight) hours.   473 mL   0   . insulin aspart (NOVOLOG) 100 UNIT/ML injection   Subcutaneous   Inject 0-9 Units into the skin every 4 (four) hours. On sliding scale         . insulin glargine (LANTUS) 100 UNIT/ML injection   Subcutaneous   Inject 10 Units into the skin at bedtime.          Marland Kitchen labetalol (NORMODYNE) 200 MG tablet   Per Tube   Place 1 tablet (200 mg total) into feeding tube 2 (two) times daily.   60 tablet   0   . lisinopril (PRINIVIL,ZESTRIL) 10 MG tablet   Per Tube   Place 10 mg into feeding tube daily.          . magnesium oxide (MAG-OX) 400 (241.3 MG) MG tablet   Per Tube   Place 1 tablet (400 mg total) into feeding tube daily.   30 tablet   0   . metoCLOPramide (REGLAN) 5 MG/5ML solution   Oral   Take 10 mLs (10 mg total) by mouth 3 (three) times daily before meals.   120 mL   0   . mirtazapine (REMERON) 15 MG tablet   Per Tube   Place 15 mg into feeding tube at bedtime.         . Multiple Vitamin (MULTIVITAMIN) LIQD   Per Tube   Place 5 mLs into feeding tube daily.   60 mL   1   . oxyCODONE (OXY IR/ROXICODONE) 5 MG immediate release tablet   Oral   Take 1 tablet (5 mg total) by mouth every 4 (four) hours as needed for moderate pain.   20 tablet   0   . pantoprazole sodium (PROTONIX) 40 mg/20 mL PACK   Per Tube   Place 20 mLs (40 mg total) into feeding tube 2 (two) times daily.   30 each   0   . promethazine (PHENERGAN) 25 MG tablet   Per Tube   Place 1 tablet (25 mg total) into feeding tube every 6 (six) hours as needed for nausea or vomiting.   30 tablet   0   . sucralfate (CARAFATE) 1 GM/10ML suspension   Per Tube   Place 10 mLs (1 g total) into feeding tube 4 (four) times daily -  with meals and at bedtime.   420 mL   0   . vancomycin (VANCOCIN) 50 mg/mL oral solution   Per Tube   Place 2.5 mLs (125 mg total) into feeding  tube every 6 (six) hours.   50 mL   0     125mg  per tube q6hrs, dispense 4 day supply   .  zolpidem (AMBIEN) 10 MG tablet   Per Tube   Place 10 mg into feeding tube at bedtime.          BP 202/136  Pulse 121  Resp 26  SpO2 99% Physical Exam  Nursing note and vitals reviewed. Constitutional: She is oriented to person, place, and time. She has a sickly appearance. She appears distressed.  HENT:  Head: Normocephalic and atraumatic.  Eyes: Pupils are equal, round, and reactive to light.  Neck: Normal range of motion.  Cardiovascular: Regular rhythm and intact distal pulses.  Tachycardia present.   Pulmonary/Chest: Tachypnea noted.  Abdominal: Normal appearance. She exhibits no distension. There is tenderness. There is no rigidity, no rebound and no guarding.    Musculoskeletal: Normal range of motion.  Neurological: She is alert and oriented to person, place, and time. No cranial nerve deficit.  Skin: Skin is warm. No rash noted. She is diaphoretic.  Psychiatric: She has a normal mood and affect. Her behavior is normal.    ED Course  Procedures (including critical care time) Labs Review Labs Reviewed  CBC WITH DIFFERENTIAL  COMPREHENSIVE METABOLIC PANEL  LIPASE, BLOOD  URINALYSIS, ROUTINE W REFLEX MICROSCOPIC  POC URINE PREG, ED   Imaging Review No results found.  O2- 99%ra, nml   I reviewed the EMR  5:14 PM Vomiting, nausea seems improved.  Vital signs are similar   10:26 PM Patient sleeping  VS stable.  128/77  Patient will f/u w PMD at [facility] tomorrow.  MDM  This patient with chronic abdominal pain presents with abdominal pain nausea, vomiting.  Patient's labs are reassuring.  Patient improved substantially here with fluids, antiemetics, analgesics.  Patient's hypertension resolved, vital signs normalized, and she was sleeping on repeat exam.  The patient has chronic pain, she is stable for discharge with outpatient followup.  She has a physician at [facility] and she'll followup tomorrow.   Carmin Muskrat, MD 09/26/13 2228  11:40  PM Patient requests pain meds prior to leaving.  This seems reasonable.  She is departing via taxi.   Carmin Muskrat, MD 09/26/13 2340

## 2013-09-26 NOTE — Discharge Instructions (Signed)
As discussed, it is important that you follow up as soon as possible with your physician for continued management of your condition. ° °If you develop any new, or concerning changes in your condition, please return to the emergency department immediately. ° °

## 2013-09-26 NOTE — ED Notes (Signed)
Patient is aware that we need a urine specimen, states "I can't go right now, and I need something for pain." RN Matt notified about pain meds.

## 2013-09-26 NOTE — Progress Notes (Signed)
   CARE MANAGEMENT ED NOTE 09/26/2013  Patient:  [name], [name]   Account Number:  [id number]  Date Initiated:  09/26/2013  Documentation initiated by:  Jackelyn Poling  Subjective/Objective Assessment:   43 yr old medicare/medicaid of [place] pt d/c from [place] on 09/25/13 0844 went to 09/25/13 0900 GI office appointment with Dr Juanita Craver & pain management via Dr Era Bumpers     Subjective/Objective Assessment Detail:   placement of gastrojejunostomy tube in 01/15  Returned to [facility] ED 09/26/13 at 1607 for c/o abdominal pain and nausea via EMS Pt given phenergan iv in [place] ED 09/26/13 with some improvements  Home meds: Protonix 40 mg/20 ml per feeding tube, phenergan 25 mg q 6 hrs prn for nausea, vomiting, sucralfate 10 ml qid with meals & QHS  Pt active with [facility] for TPN therapy since last [facility] Admission [place] [facility]) called to check on pt  1800 Pam, IV RN, from [facility] came to [facility] ED to check on pt Discuss possible Reglan use     Action/Plan:   spoke with kristen and pam Reviewed EPIC notes and care everywhere [facility]   Action/Plan Detail:   Admission status pending results of further labs   Anticipated DC Date:  09/26/2013     Status Recommendation to Physician:   Result of Recommendation:    Other ED Services  Consult Working [place]  Other   [place]   Choice offered to / List presented to:  C-1 Patient  DME arranged  TUBE FEEDING  IV PUMP/EQUIPMENT     DME agency  [facility] arranged  HH-1 RN      [place].    Status of service:  Completed, signed off  ED Comments:   ED Comments Detail:

## 2013-09-26 NOTE — ED Notes (Signed)
Bed: WA01 Expected date:  Expected time:  Means of arrival:  Comments: EMS-N/V-here yesterday

## 2013-09-26 NOTE — ED Notes (Addendum)
Per EMS, pt complains of 10/10 abdominal pain, nausea, vomiting. Pt has hx of pancreatitis. Pt was released from hospital yesterday.

## 2013-10-08 ENCOUNTER — Emergency Department (HOSPITAL_COMMUNITY)
Admission: EM | Admit: 2013-10-08 | Discharge: 2013-10-08 | Disposition: A | Discharge status: Home / Self Care | Payer: Medicare Other | Attending: Emergency Medicine | Admitting: Emergency Medicine

## 2013-10-08 ENCOUNTER — Emergency Department (HOSPITAL_COMMUNITY): Payer: Medicare Other

## 2013-10-08 ENCOUNTER — Encounter (HOSPITAL_COMMUNITY): Payer: Self-pay | Admitting: Emergency Medicine

## 2013-10-08 DIAGNOSIS — R1013 Epigastric pain: Secondary | ICD-10-CM

## 2013-10-08 DIAGNOSIS — K5900 Constipation, unspecified: Secondary | ICD-10-CM | POA: Insufficient documentation

## 2013-10-08 DIAGNOSIS — Z931 Gastrostomy status: Secondary | ICD-10-CM | POA: Insufficient documentation

## 2013-10-08 DIAGNOSIS — J449 Chronic obstructive pulmonary disease, unspecified: Secondary | ICD-10-CM | POA: Insufficient documentation

## 2013-10-08 DIAGNOSIS — E1149 Type 2 diabetes mellitus with other diabetic neurological complication: Secondary | ICD-10-CM | POA: Insufficient documentation

## 2013-10-08 DIAGNOSIS — K3184 Gastroparesis: Secondary | ICD-10-CM | POA: Insufficient documentation

## 2013-10-08 DIAGNOSIS — Z794 Long term (current) use of insulin: Secondary | ICD-10-CM | POA: Insufficient documentation

## 2013-10-08 DIAGNOSIS — Z9861 Coronary angioplasty status: Secondary | ICD-10-CM | POA: Insufficient documentation

## 2013-10-08 DIAGNOSIS — K219 Gastro-esophageal reflux disease without esophagitis: Secondary | ICD-10-CM | POA: Insufficient documentation

## 2013-10-08 DIAGNOSIS — IMO0002 Reserved for concepts with insufficient information to code with codable children: Secondary | ICD-10-CM | POA: Insufficient documentation

## 2013-10-08 DIAGNOSIS — Z87891 Personal history of nicotine dependence: Secondary | ICD-10-CM | POA: Insufficient documentation

## 2013-10-08 DIAGNOSIS — E785 Hyperlipidemia, unspecified: Secondary | ICD-10-CM | POA: Insufficient documentation

## 2013-10-08 DIAGNOSIS — R739 Hyperglycemia, unspecified: Secondary | ICD-10-CM

## 2013-10-08 DIAGNOSIS — E1165 Type 2 diabetes mellitus with hyperglycemia: Secondary | ICD-10-CM

## 2013-10-08 DIAGNOSIS — Z880 Allergy status to penicillin: Secondary | ICD-10-CM | POA: Insufficient documentation

## 2013-10-08 DIAGNOSIS — I2510 Atherosclerotic heart disease of native coronary artery without angina pectoris: Secondary | ICD-10-CM | POA: Insufficient documentation

## 2013-10-08 DIAGNOSIS — R112 Nausea with vomiting, unspecified: Secondary | ICD-10-CM

## 2013-10-08 DIAGNOSIS — K861 Other chronic pancreatitis: Secondary | ICD-10-CM | POA: Insufficient documentation

## 2013-10-08 DIAGNOSIS — G40909 Epilepsy, unspecified, not intractable, without status epilepticus: Secondary | ICD-10-CM | POA: Insufficient documentation

## 2013-10-08 DIAGNOSIS — Z79899 Other long term (current) drug therapy: Secondary | ICD-10-CM | POA: Insufficient documentation

## 2013-10-08 DIAGNOSIS — E1143 Type 2 diabetes mellitus with diabetic autonomic (poly)neuropathy: Secondary | ICD-10-CM

## 2013-10-08 DIAGNOSIS — Z792 Long term (current) use of antibiotics: Secondary | ICD-10-CM | POA: Insufficient documentation

## 2013-10-08 DIAGNOSIS — J4489 Other specified chronic obstructive pulmonary disease: Secondary | ICD-10-CM | POA: Insufficient documentation

## 2013-10-08 DIAGNOSIS — I10 Essential (primary) hypertension: Secondary | ICD-10-CM

## 2013-10-08 DIAGNOSIS — I252 Old myocardial infarction: Secondary | ICD-10-CM | POA: Insufficient documentation

## 2013-10-08 DIAGNOSIS — M549 Dorsalgia, unspecified: Secondary | ICD-10-CM | POA: Insufficient documentation

## 2013-10-08 LAB — CBC WITH DIFFERENTIAL/PLATELET
BASOS ABS: 0 10*3/uL (ref 0.0–0.1)
Basophils Relative: 0 % (ref 0–1)
Eosinophils Absolute: 0.3 10*3/uL (ref 0.0–0.7)
Eosinophils Relative: 3 % (ref 0–5)
HCT: 27.3 % — ABNORMAL LOW (ref 36.0–46.0)
Hemoglobin: 9.3 g/dL — ABNORMAL LOW (ref 12.0–15.0)
LYMPHS ABS: 2.3 10*3/uL (ref 0.7–4.0)
Lymphocytes Relative: 25 % (ref 12–46)
MCH: 29.4 pg (ref 26.0–34.0)
MCHC: 34.1 g/dL (ref 30.0–36.0)
MCV: 86.4 fL (ref 78.0–100.0)
MONO ABS: 0.5 10*3/uL (ref 0.1–1.0)
Monocytes Relative: 5 % (ref 3–12)
Neutro Abs: 5.9 10*3/uL (ref 1.7–7.7)
Neutrophils Relative %: 67 % (ref 43–77)
PLATELETS: 200 10*3/uL (ref 150–400)
RBC: 3.16 MIL/uL — ABNORMAL LOW (ref 3.87–5.11)
RDW: 15.8 % — AB (ref 11.5–15.5)
WBC: 9 10*3/uL (ref 4.0–10.5)

## 2013-10-08 LAB — COMPREHENSIVE METABOLIC PANEL
ALBUMIN: 2.6 g/dL — AB (ref 3.5–5.2)
ALT: 89 U/L — ABNORMAL HIGH (ref 0–35)
AST: 108 U/L — AB (ref 0–37)
Alkaline Phosphatase: 167 U/L — ABNORMAL HIGH (ref 39–117)
BUN: 16 mg/dL (ref 6–23)
CHLORIDE: 94 meq/L — AB (ref 96–112)
CO2: 24 mEq/L (ref 19–32)
Calcium: 9.2 mg/dL (ref 8.4–10.5)
Creatinine, Ser: 0.53 mg/dL (ref 0.50–1.10)
GFR calc Af Amer: >90 mL/min (ref >90)
GFR calc non Af Amer: >90 mL/min (ref >90)
Glucose, Bld: 305 mg/dL — ABNORMAL HIGH (ref 70–99)
Potassium: 4.5 mEq/L (ref 3.7–5.3)
Sodium: 129 mEq/L — ABNORMAL LOW (ref 137–147)
Total Protein: 7.3 g/dL (ref 6.0–8.3)

## 2013-10-08 LAB — URINALYSIS, ROUTINE W REFLEX MICROSCOPIC
Bilirubin Urine: NEGATIVE
GLUCOSE, UA: 100 mg/dL — AB
HGB URINE DIPSTICK: NEGATIVE
Ketones, ur: NEGATIVE mg/dL
LEUKOCYTES UA: NEGATIVE
Nitrite: NEGATIVE
PROTEIN: 100 mg/dL — AB
Specific Gravity, Urine: 1.007 (ref 1.005–1.030)
Urobilinogen, UA: 0.2 mg/dL (ref 0.0–1.0)
pH: 6 (ref 5.0–8.0)

## 2013-10-08 LAB — CBG MONITORING, ED
Glucose-Capillary: 299 mg/dL — ABNORMAL HIGH (ref 70–99)
Glucose-Capillary: 268 mg/dL — ABNORMAL HIGH (ref 70–99)

## 2013-10-08 LAB — URINE MICROSCOPIC-ADD ON

## 2013-10-08 LAB — LIPASE, BLOOD: LIPASE: 23 U/L (ref 11–59)

## 2013-10-08 MED ORDER — PROMETHAZINE HCL 25 MG/ML IJ SOLN
12.5000 mg | Freq: Once | INTRAMUSCULAR | Status: AC
  Administered 2013-10-08: 12.5 mg via INTRAVENOUS
  Filled 2013-10-08: qty 1

## 2013-10-08 MED ORDER — HYDROMORPHONE HCL PF 1 MG/ML IJ SOLN
1.0000 mg | Freq: Once | INTRAMUSCULAR | Status: AC
  Administered 2013-10-08: 1 mg via INTRAVENOUS
  Filled 2013-10-08: qty 1

## 2013-10-08 MED ORDER — SODIUM CHLORIDE 0.9 % IV BOLUS (SEPSIS)
1000.0000 mL | Freq: Once | INTRAVENOUS | Status: AC
  Administered 2013-10-08: 1000 mL via INTRAVENOUS

## 2013-10-08 MED ORDER — PROMETHAZINE HCL 25 MG/ML IJ SOLN
25.0000 mg | Freq: Once | INTRAMUSCULAR | Status: AC
  Administered 2013-10-08: 25 mg via INTRAVENOUS
  Filled 2013-10-08: qty 1

## 2013-10-08 MED ORDER — HEPARIN SOD (PORK) LOCK FLUSH 100 UNIT/ML IV SOLN
500.0000 [IU] | Freq: Once | INTRAVENOUS | Status: AC
  Administered 2013-10-08: 500 [IU]
  Filled 2013-10-08: qty 5

## 2013-10-08 NOTE — ED Provider Notes (Signed)
CSN: [id number]     Arrival date & time 10/08/13  1424 History   First MD Initiated Contact with Patient 10/08/13 1503     Chief Complaint  Patient presents with  . Hypertension  . Hyperglycemia     (Consider location/radiation/quality/duration/timing/severity/associated sxs/prior Treatment) The history is provided by the patient and medical records. No language interpreter was used.    [name] is a 43 y.o. female  with a hx of chronic pancreatitis and gastroparesis presents to the Emergency Department complaining of gradual, persistent, progressively worsening nausea, vomiting and epigastric abd pain onset 10 weeks ago. Pt went to PCP this AM for follow-up of these symptoms and was found to be hypertensive and hyperglycemic.  She was subsequently sent here to the ED.  Pt reports associated radiation of the pain to her back but reports this is normal with her chronic pain.  She states today's pain is unchanged from previous episodes.  Phenergan and Duragesic patch makes the pain better and palpation and attempting to eat makes it worse.  Pt denies fever, chills, headache, neck pain, CP, SOB, diarrhea weakness, dizziness, syncope, dysuria.  Pt reports she does not use her G-tube, but takes TPN every day.  She reports to issues with her PICC line or TPN administration.  Pt reports she was seen today only by the RN at the clinic and not by Dr. Jonelle Sidle himself.  She reports that she saw Dr. Jonelle Sidle last week and he changed her HTN meds from lisinopril to something new that she cannot remember. She reports higher than usual blood pressures since that time.   GI: Lacinda Axon PCP: Jonelle Sidle   Past Medical History  Diagnosis Date  . Hypertension   . 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 Gastroparesis due to DM   . GERD (gastroesophageal reflux disease)   . Coronary artery disease   . Diabetic neuropathy, painful   . Chronic pancreatitis   . Dyslipidemia   . MI (myocardial  infarction)   . COPD (chronic obstructive pulmonary disease)   . Seizures   . Diverticulosis     s/p partial bowel resection   Past Surgical History  Procedure Laterality Date  . Abdominal hysterectomy    . Cholecystectomy    . Peg tube x 4      feeding jejunostomies with PEG tubes  . Cesarean section    . Colon resection due to diverticulitis    . Back surgery    . Esophagogastroduodenoscopy N/A 12/27/2012    Procedure: ESOPHAGOGASTRODUODENOSCOPY (EGD);  Surgeon: Missy Sabins, MD;  Location: [facility];  Service: Endoscopy;  Laterality: N/A;  . Esophagogastroduodenoscopy (egd) with esophageal dilation N/A 02/19/2013    Procedure: ESOPHAGOGASTRODUODENOSCOPY (EGD) WITH ESOPHAGEAL DILATION;  Surgeon: Wonda Horner, MD;  Location: [facility];  Service: Endoscopy;  Laterality: N/A;  . Balloon dilation N/A 02/19/2013    Procedure: BALLOON DILATION;  Surgeon: Wonda Horner, MD;  Location: [facility];  Service: Endoscopy;  Laterality: N/A;  . Coronary stent placement      2 stents [place]  . Esophagogastroduodenoscopy N/A 09/13/2013    Procedure: ESOPHAGOGASTRODUODENOSCOPY (EGD);  Surgeon: Cleotis Nipper, MD;  Location: [facility];  Service: Endoscopy;  Laterality: N/A;   Family History  Problem Relation Age of Onset  . Endometrial cancer Mother   . Diabetes Mellitus II Maternal Grandmother   . Diabetes Mellitus II Mother   . Diabetes Mellitus II Sister   . Emphysema Mother   .  Heart attack Father    History  Substance Use Topics  . Smoking status: Former Smoker -- 0.25 packs/day for 6 years    Types: Cigarettes, Cigars    Quit date: 08/01/2013  . Smokeless tobacco: Never Used  . Alcohol Use: No   OB History   Grav Para Term Preterm Abortions TAB SAB Ect Mult Living                 Review of Systems  Constitutional: Negative for fever, diaphoresis, appetite change, fatigue and unexpected weight change.  HENT: Negative for mouth sores and trouble swallowing.    Respiratory: Negative for cough, chest tightness, shortness of breath, wheezing and stridor.   Cardiovascular: Negative for chest pain and palpitations.  Gastrointestinal: Positive for nausea, vomiting and abdominal pain. Negative for diarrhea, constipation, blood in stool, abdominal distention and rectal pain.  Genitourinary: Negative for dysuria, urgency, frequency, hematuria, flank pain and difficulty urinating.  Musculoskeletal: Positive for back pain. Negative for neck pain and neck stiffness.  Skin: Negative for rash.  Neurological: Negative for weakness.  Hematological: Negative for adenopathy.  Psychiatric/Behavioral: Negative for confusion.  All other systems reviewed and are negative.      Allergies  Cefadroxil; Compazine; Darvocet; Zofran; Nsaids; and Penicillins  Home Medications   Current Outpatient Rx  Name  Route  Sig  Dispense  Refill  . busPIRone (BUSPAR) 5 MG tablet   Per Tube   Place 5 mg into feeding tube 3 (three) times daily.         . fentaNYL (DURAGESIC - DOSED MCG/HR) 25 MCG/HR patch   Transdermal   Place 25 mcg onto the skin every 3 (three) days.         . Fluticasone-Salmeterol (ADVAIR) 250-50 MCG/DOSE AEPB   Inhalation   Inhale 1 puff into the lungs 2 (two) times daily.   60 each   0   . gabapentin (NEURONTIN) 250 MG/5ML solution   Per Tube   Place 12 mLs (600 mg total) into feeding tube every 8 (eight) hours.   473 mL   0   . insulin glargine (LANTUS) 100 UNIT/ML injection   Subcutaneous   Inject 10 Units into the skin at bedtime.          . insulin lispro (HUMALOG) 100 UNIT/ML injection   Subcutaneous   Inject 0-9 Units into the skin 3 (three) times daily before meals. Sliding scale: 0-150=0 units, 151-200= 2 units, 201-250= 4 units, 251-300=6 units, 301-350=8 units, 351-400=10 units, >400 = 9 units         . labetalol (NORMODYNE) 200 MG tablet   Per Tube   Place 1 tablet (200 mg total) into feeding tube 2 (two) times  daily.   60 tablet   0   . lisinopril (PRINIVIL,ZESTRIL) 10 MG tablet   Per Tube   Place 10 mg into feeding tube daily.          Marland Kitchen LORazepam (ATIVAN) 0.5 MG tablet   Oral   Take 0.5 mg by mouth every 4 (four) hours as needed (nausea).         . metoCLOPramide (REGLAN) 5 MG/5ML solution   Oral   Take 10 mLs (10 mg total) by mouth 3 (three) times daily before meals.   120 mL   0   . mirtazapine (REMERON) 15 MG tablet   Per Tube   Place 15 mg into feeding tube at bedtime.         . Multiple  Vitamin (MULTIVITAMIN) LIQD   Per Tube   Place 5 mLs into feeding tube daily.   60 mL   1   . oxyCODONE (OXY IR/ROXICODONE) 5 MG immediate release tablet   Oral   Take 1 tablet (5 mg total) by mouth every 4 (four) hours as needed for moderate pain.   20 tablet   0   . pantoprazole sodium (PROTONIX) 40 mg/20 mL PACK   Per Tube   Place 20 mLs (40 mg total) into feeding tube 2 (two) times daily.   30 each   0   . promethazine (PHENERGAN) 25 MG tablet   Per Tube   Place 1 tablet (25 mg total) into feeding tube every 6 (six) hours as needed for nausea or vomiting.   30 tablet   0   . sucralfate (CARAFATE) 1 GM/10ML suspension   Per Tube   Place 10 mLs (1 g total) into feeding tube 4 (four) times daily -  with meals and at bedtime.   420 mL   0   . vancomycin (VANCOCIN) 50 mg/mL oral solution   Per Tube   Place 2.5 mLs (125 mg total) into feeding tube every 6 (six) hours.   50 mL   0     125mg  per tube q6hrs, dispense 4 day supply   . zolpidem (AMBIEN) 10 MG tablet   Per Tube   Place 10 mg into feeding tube at bedtime.          BP 161/84  Pulse 108  Temp(Src) 98.6 F (37 C) (Oral)  Resp 18  SpO2 98% Physical Exam  Nursing note and vitals reviewed. Constitutional: She is oriented to person, place, and time. She appears well-developed and well-nourished. No distress.  Awake, alert, nontoxic appearance  HENT:  Head: Normocephalic and atraumatic.   Mouth/Throat: Oropharynx is clear and moist. No oropharyngeal exudate.  Eyes: Conjunctivae are normal. No scleral icterus.  Neck: Normal range of motion. Neck supple.  Cardiovascular: Normal rate, regular rhythm, normal heart sounds and intact distal pulses.   No tachycardia  Pulmonary/Chest: Effort normal and breath sounds normal. No respiratory distress. She has no wheezes.  Clear and equal breath sounds  Abdominal: Soft. Bowel sounds are normal. She exhibits no distension and no mass. There is tenderness. There is guarding. There is no rebound.  Numerous, well healed scars to the abd.   Generally tender with guarding, but no distention, rebound or peritoneal signs G-tube in place with drainage from the insertion site, but without induration or evidence of infection  Musculoskeletal: Normal range of motion. She exhibits no edema.  PICC line in place in the RUE without erythema, induration or evidence of infection  Lymphadenopathy:    She has no cervical adenopathy.  Neurological: She is alert and oriented to person, place, and time. She exhibits normal muscle tone. Coordination normal.  Speech is clear and goal oriented Moves extremities without ataxia  Skin: Skin is warm and dry. No rash noted. She is not diaphoretic. No erythema.  Psychiatric: She has a normal mood and affect.    ED Course  Procedures (including critical care time) Labs Review Labs Reviewed  COMPREHENSIVE METABOLIC PANEL - Abnormal; Notable for the following:    Sodium 129 (*)    Chloride 94 (*)    Glucose, Bld 305 (*)    Albumin 2.6 (*)    AST 108 (*)    ALT 89 (*)    Alkaline Phosphatase 167 (*)  Total Bilirubin <0.2 (*)    All other components within normal limits  CBC WITH DIFFERENTIAL - Abnormal; Notable for the following:    RBC 3.16 (*)    Hemoglobin 9.3 (*)    HCT 27.3 (*)    RDW 15.8 (*)    All other components within normal limits  URINALYSIS, ROUTINE W REFLEX MICROSCOPIC - Abnormal;  Notable for the following:    Glucose, UA 100 (*)    Protein, ur 100 (*)    All other components within normal limits  URINE MICROSCOPIC-ADD ON - Abnormal; Notable for the following:    Casts HYALINE CASTS (*)    All other components within normal limits  CBG MONITORING, ED - Abnormal; Notable for the following:    Glucose-Capillary 299 (*)    All other components within normal limits  CBG MONITORING, ED - Abnormal; Notable for the following:    Glucose-Capillary 268 (*)    All other components within normal limits  LIPASE, BLOOD   Imaging Review Dg Abd Acute W/chest  10/08/2013   CLINICAL DATA History of pancreatitis and gastric pairs is also COPD and diabetes ; PEG tube ; epigastric pain.  EXAM ACUTE ABDOMEN SERIES (ABDOMEN 2 VIEW & CHEST 1 VIEW)  COMPARISON DG ABD PORTABLE 1V dated 09/21/2013; DG CHEST 1V PORT dated 09/19/2013  FINDINGS The lungs are mildly hypoinflated. The interstitial markings are prominent but stable. The cardiac silhouette is top-normal in size. The pulmonary vascularity is not clearly engorged. A PICC line is in place via the right upper extremity with the tip in the region of the mid to distal SVC.  Within the abdomen a moderate stool burden is present throughout the colon. There is a PEG tube present whose tip lies in the region of the transverse portion of the duodenum. There is stool and gas in the rectum. The bones are osteopenic. There are surgical clips in the gallbladder fossa.  IMPRESSION 1. Increased stool burden within the colon is present. There is no evidence of a small or large bowel obstruction. 2. The PEG tube is in reasonable position with the tip in the transverse portion of the duodenum. 3. There is no evidence of active cardiopulmonary disease.  SIGNATURE  Electronically Signed   By: David  Martinique   On: 10/08/2013 16:55     EKG Interpretation None      MDM   Final diagnoses:  Diabetic gastroparesis  Nausea & vomiting  Epigastric pain   Hyperglycemia  Constipation  DM (diabetes mellitus), type 2, uncontrolled  Hypertension   [name] presents with excerbation of her chronic pain, HTN and hyperglycemia.  Will give fluids, pain control and check labs.  Patient had her glycemic to 300 on arrival. Hypertensive to 151/93 however patient endorses pain. His blood pressure is not outside baseline for her.    Patient noted to be hypertensive in the emergency department.  No signs of hypertensive urgency.  Discussed with patient the need for close follow-up and management by their primary care physician.   5:00PM Patient with persistent pain, ambulates without difficulty.  Mild hyponatremia at 129 anion gap of 11.  Elevated transaminase however patient with history of cholecystectomy and no elevation in lipase.  Patient continues to endorse abdominal pain and tenderness to palpation however she remains without rigidity or rebound.  No evidence of bowel obstruction on plain films.  I personally reviewed the imaging tests through PACS system  I reviewed available ER/hospitalization records through the EMR  7:16 PM The patient was discussed with and seen by Dr. Regenia Skeeter who agrees with the treatment plan.  Pt will be given repeat dose of pain and nausea control. UA pending, if negative pt will be d/c home.  7:29 PM UA without evidence of urinary tract infection. Patient remains alert, oriented, nontoxic nonseptic appearing. She remains afebrile and not tachycardic. Blood sugar has decreased some with her fluids but she continues to receive TPN here in the emergency department.  Pt sleeping soundly.  Patient is nontoxic, nonseptic appearing, in no apparent distress.  Patient's pain and other symptoms adequately managed in emergency department.  Fluid bolus given.  Labs, imaging and vitals reviewed.  Patient does not meet the SIRS or Sepsis criteria.  On repeat exam patient does not have a surgical abdomin and there are nor peritoneal  signs.  No indication of appendicitis, bowel obstruction, bowel perforation, cholecystitis, diverticulitis.  Patient discharged home with symptomatic treatment and given strict instructions for follow-up with their primary care physician.  I have also discussed reasons to return immediately to the ER.  Patient expresses understanding and agrees with plan.  It has been determined that no acute conditions requiring further emergency intervention are present at this time. The patient/guardian have been advised of the diagnosis and plan. We have discussed signs and symptoms that warrant return to the ED, such as changes or worsening in symptoms.   Vital signs are stable at discharge.   BP 161/84  Pulse 108  Temp(Src) 98.6 F (37 C) (Oral)  Resp 18  SpO2 98%  Patient/guardian has voiced understanding and agreed to follow-up with the PCP or specialist.          Abigail Butts, PA-C 10/09/13 [phone]

## 2013-10-08 NOTE — Discharge Instructions (Signed)
1. Medications: usual home medications 2. Treatment: rest, drink plenty of fluids,  3. Follow Up: Please followup with your primary doctor for discussion of your diagnoses and further evaluation after today's visit; if you do not have a primary care doctor use the resource guide provided to find one;     Hyperglycemia Hyperglycemia occurs when the glucose (sugar) in your blood is too high. Hyperglycemia can happen for many reasons, but it most often happens to people who do not know they have diabetes or are not managing their diabetes properly.  CAUSES  Whether you have diabetes or not, there are other causes of hyperglycemia. Hyperglycemia can occur when you have diabetes, but it can also occur in other situations that you might not be as aware of, such as: Diabetes  If you have diabetes and are having problems controlling your blood glucose, hyperglycemia could occur because of some of the following reasons:  Not following your meal plan.  Not taking your diabetes medications or not taking it properly.  Exercising less or doing less activity than you normally do.  Being sick. Pre-diabetes  This cannot be ignored. Before people develop Type 2 diabetes, they almost always have "pre-diabetes." This is when your blood glucose levels are higher than normal, but not yet high enough to be diagnosed as diabetes. Research has shown that some long-term damage to the body, especially the heart and circulatory system, may already be occurring during pre-diabetes. If you take action to manage your blood glucose when you have pre-diabetes, you may delay or prevent Type 2 diabetes from developing. Stress  If you have diabetes, you may be "diet" controlled or on oral medications or insulin to control your diabetes. However, you may find that your blood glucose is higher than usual in the hospital whether you have diabetes or not. This is often referred to as "stress hyperglycemia." Stress can elevate  your blood glucose. This happens because of hormones put out by the body during times of stress. If stress has been the cause of your high blood glucose, it can be followed regularly by your caregiver. That way he/she can make sure your hyperglycemia does not continue to get worse or progress to diabetes. Steroids  Steroids are medications that act on the infection fighting system (immune system) to block inflammation or infection. One side effect can be a rise in blood glucose. Most people can produce enough extra insulin to allow for this rise, but for those who cannot, steroids make blood glucose levels go even higher. It is not unusual for steroid treatments to "uncover" diabetes that is developing. It is not always possible to determine if the hyperglycemia will go away after the steroids are stopped. A special blood test called an A1c is sometimes done to determine if your blood glucose was elevated before the steroids were started. SYMPTOMS  Thirsty.  Frequent urination.  Dry mouth.  Blurred vision.  Tired or fatigue.  Weakness.  Sleepy.  Tingling in feet or leg. DIAGNOSIS  Diagnosis is made by monitoring blood glucose in one or all of the following ways:  A1c test. This is a chemical found in your blood.  Fingerstick blood glucose monitoring.  Laboratory results. TREATMENT  First, knowing the cause of the hyperglycemia is important before the hyperglycemia can be treated. Treatment may include, but is not be limited to:  Education.  Change or adjustment in medications.  Change or adjustment in meal plan.  Treatment for an illness, infection, etc.  More  frequent blood glucose monitoring.  Change in exercise plan.  Decreasing or stopping steroids.  Lifestyle changes. HOME CARE INSTRUCTIONS   Test your blood glucose as directed.  Exercise regularly. Your caregiver will give you instructions about exercise. Pre-diabetes or diabetes which comes on with stress is  helped by exercising.  Eat wholesome, balanced meals. Eat often and at regular, fixed times. Your caregiver or nutritionist will give you a meal plan to guide your sugar intake.  Being at an ideal weight is important. If needed, losing as little as 10 to 15 pounds may help improve blood glucose levels. SEEK MEDICAL CARE IF:   You have questions about medicine, activity, or diet.  You continue to have symptoms (problems such as increased thirst, urination, or weight gain). SEEK IMMEDIATE MEDICAL CARE IF:   You are vomiting or have diarrhea.  Your breath smells fruity.  You are breathing faster or slower.  You are very sleepy or incoherent.  You have numbness, tingling, or pain in your feet or hands.  You have chest pain.  Your symptoms get worse even though you have been following your caregiver's orders.  If you have any other questions or concerns. Document Released: 01/11/2001 Document Revised: 10/10/2011 Document Reviewed: 11/14/2011 [facility] Patient Information 2014 [place], [place].

## 2013-10-08 NOTE — ED Notes (Signed)
PT ambulated to [facility] without assistance at this time

## 2013-10-08 NOTE — ED Notes (Signed)
Pt went to her PCP at [facility] today fro regular check up and her BP was 162/102 and CBG was 358 so they sent her here for further eval.  Pt states that she had epigastric pain which is chronic due to pancreatitis and gastroparesis.

## 2013-10-10 ENCOUNTER — Encounter (HOSPITAL_COMMUNITY): Payer: Self-pay | Admitting: Emergency Medicine

## 2013-10-10 ENCOUNTER — Emergency Department (HOSPITAL_COMMUNITY)
Admission: EM | Admit: 2013-10-10 | Discharge: 2013-10-10 | Disposition: A | Discharge status: Home / Self Care | Payer: Medicare Other | Attending: Emergency Medicine | Admitting: Emergency Medicine

## 2013-10-10 DIAGNOSIS — Z79899 Other long term (current) drug therapy: Secondary | ICD-10-CM | POA: Insufficient documentation

## 2013-10-10 DIAGNOSIS — Z9861 Coronary angioplasty status: Secondary | ICD-10-CM | POA: Insufficient documentation

## 2013-10-10 DIAGNOSIS — R109 Unspecified abdominal pain: Secondary | ICD-10-CM

## 2013-10-10 DIAGNOSIS — IMO0002 Reserved for concepts with insufficient information to code with codable children: Secondary | ICD-10-CM | POA: Insufficient documentation

## 2013-10-10 DIAGNOSIS — K219 Gastro-esophageal reflux disease without esophagitis: Secondary | ICD-10-CM | POA: Insufficient documentation

## 2013-10-10 DIAGNOSIS — R1013 Epigastric pain: Secondary | ICD-10-CM | POA: Insufficient documentation

## 2013-10-10 DIAGNOSIS — Z880 Allergy status to penicillin: Secondary | ICD-10-CM | POA: Insufficient documentation

## 2013-10-10 DIAGNOSIS — R000 Tachycardia, unspecified: Secondary | ICD-10-CM | POA: Insufficient documentation

## 2013-10-10 DIAGNOSIS — J4489 Other specified chronic obstructive pulmonary disease: Secondary | ICD-10-CM | POA: Insufficient documentation

## 2013-10-10 DIAGNOSIS — G40909 Epilepsy, unspecified, not intractable, without status epilepticus: Secondary | ICD-10-CM | POA: Insufficient documentation

## 2013-10-10 DIAGNOSIS — I2510 Atherosclerotic heart disease of native coronary artery without angina pectoris: Secondary | ICD-10-CM | POA: Insufficient documentation

## 2013-10-10 DIAGNOSIS — E1049 Type 1 diabetes mellitus with other diabetic neurological complication: Secondary | ICD-10-CM | POA: Insufficient documentation

## 2013-10-10 DIAGNOSIS — E1142 Type 2 diabetes mellitus with diabetic polyneuropathy: Secondary | ICD-10-CM | POA: Insufficient documentation

## 2013-10-10 DIAGNOSIS — J449 Chronic obstructive pulmonary disease, unspecified: Secondary | ICD-10-CM | POA: Insufficient documentation

## 2013-10-10 DIAGNOSIS — Z87891 Personal history of nicotine dependence: Secondary | ICD-10-CM | POA: Insufficient documentation

## 2013-10-10 DIAGNOSIS — R112 Nausea with vomiting, unspecified: Secondary | ICD-10-CM | POA: Insufficient documentation

## 2013-10-10 DIAGNOSIS — Z792 Long term (current) use of antibiotics: Secondary | ICD-10-CM | POA: Insufficient documentation

## 2013-10-10 DIAGNOSIS — I10 Essential (primary) hypertension: Secondary | ICD-10-CM | POA: Insufficient documentation

## 2013-10-10 DIAGNOSIS — Z90710 Acquired absence of both cervix and uterus: Secondary | ICD-10-CM | POA: Insufficient documentation

## 2013-10-10 DIAGNOSIS — K3184 Gastroparesis: Secondary | ICD-10-CM | POA: Insufficient documentation

## 2013-10-10 DIAGNOSIS — I252 Old myocardial infarction: Secondary | ICD-10-CM | POA: Insufficient documentation

## 2013-10-10 DIAGNOSIS — R197 Diarrhea, unspecified: Secondary | ICD-10-CM | POA: Insufficient documentation

## 2013-10-10 DIAGNOSIS — Z794 Long term (current) use of insulin: Secondary | ICD-10-CM | POA: Insufficient documentation

## 2013-10-10 LAB — COMPREHENSIVE METABOLIC PANEL
ALBUMIN: 2.1 g/dL — AB (ref 3.5–5.2)
ALK PHOS: 116 U/L (ref 39–117)
ALT: 31 U/L (ref 0–35)
AST: 24 U/L (ref 0–37)
BUN: 13 mg/dL (ref 6–23)
CALCIUM: 8.8 mg/dL (ref 8.4–10.5)
CO2: 16 mEq/L — ABNORMAL LOW (ref 19–32)
Chloride: 90 mEq/L — ABNORMAL LOW (ref 96–112)
Creatinine, Ser: 0.59 mg/dL (ref 0.50–1.10)
GFR calc Af Amer: >90 mL/min (ref >90)
GFR calc non Af Amer: >90 mL/min (ref >90)
Glucose, Bld: 1201 mg/dL (ref 70–99)
Potassium: >7.7 mEq/L (ref 3.7–5.3)
SODIUM: 121 meq/L — AB (ref 137–147)
TOTAL PROTEIN: 6.1 g/dL (ref 6.0–8.3)
Total Bilirubin: <0.2 mg/dL — ABNORMAL LOW (ref 0.3–1.2)

## 2013-10-10 LAB — BLOOD GAS, VENOUS
ACID-BASE DEFICIT: 0.7 mmol/L (ref 0.0–2.0)
BICARBONATE: 23.7 meq/L (ref 20.0–24.0)
FIO2: 0.21 %
O2 Saturation: 68.6 %
PO2 VEN: 39.3 mmHg (ref 30.0–45.0)
Patient temperature: 98.6
TCO2: 22 mmol/L (ref 0–100)
pCO2, Ven: 40.6 mmHg — ABNORMAL LOW (ref 45.0–50.0)
pH, Ven: 7.385 — ABNORMAL HIGH (ref 7.250–7.300)

## 2013-10-10 LAB — URINALYSIS, ROUTINE W REFLEX MICROSCOPIC
BILIRUBIN URINE: NEGATIVE
Glucose, UA: >1000 mg/dL — AB
Ketones, ur: NEGATIVE mg/dL
Leukocytes, UA: NEGATIVE
Nitrite: NEGATIVE
PH: 6 (ref 5.0–8.0)
Protein, ur: 100 mg/dL — AB
SPECIFIC GRAVITY, URINE: 1.012 (ref 1.005–1.030)
Urobilinogen, UA: 0.2 mg/dL (ref 0.0–1.0)

## 2013-10-10 LAB — CBC WITH DIFFERENTIAL/PLATELET
BASOS ABS: 0 10*3/uL (ref 0.0–0.1)
Basophils Relative: 0 % (ref 0–1)
Eosinophils Absolute: 0.2 10*3/uL (ref 0.0–0.7)
Eosinophils Relative: 2 % (ref 0–5)
HCT: 27.2 % — ABNORMAL LOW (ref 36.0–46.0)
Hemoglobin: 8.7 g/dL — ABNORMAL LOW (ref 12.0–15.0)
LYMPHS PCT: 25 % (ref 12–46)
Lymphs Abs: 2 10*3/uL (ref 0.7–4.0)
MCH: 30.4 pg (ref 26.0–34.0)
MCHC: 32 g/dL (ref 30.0–36.0)
MCV: 95.1 fL (ref 78.0–100.0)
MONOS PCT: 5 % (ref 3–12)
Monocytes Absolute: 0.4 10*3/uL (ref 0.1–1.0)
Neutro Abs: 5.2 10*3/uL (ref 1.7–7.7)
Neutrophils Relative %: 68 % (ref 43–77)
PLATELETS: 183 10*3/uL (ref 150–400)
RBC: 2.86 MIL/uL — ABNORMAL LOW (ref 3.87–5.11)
RDW: 17 % — AB (ref 11.5–15.5)
WBC: 7.8 10*3/uL (ref 4.0–10.5)

## 2013-10-10 LAB — I-STAT CHEM 8, ED
BUN: 11 mg/dL (ref 6–23)
Calcium, Ion: 1.21 mmol/L (ref 1.12–1.23)
Chloride: 100 mEq/L (ref 96–112)
Creatinine, Ser: 0.5 mg/dL (ref 0.50–1.10)
Glucose, Bld: 246 mg/dL — ABNORMAL HIGH (ref 70–99)
HCT: 29 % — ABNORMAL LOW (ref 36.0–46.0)
Hemoglobin: 9.9 g/dL — ABNORMAL LOW (ref 12.0–15.0)
Potassium: 4.3 mEq/L (ref 3.7–5.3)
Sodium: 137 mEq/L (ref 137–147)
TCO2: 23 mmol/L (ref 0–100)

## 2013-10-10 LAB — CBG MONITORING, ED: GLUCOSE-CAPILLARY: 268 mg/dL — AB (ref 70–99)

## 2013-10-10 LAB — LIPASE, BLOOD: LIPASE: 14 U/L (ref 11–59)

## 2013-10-10 LAB — URINE MICROSCOPIC-ADD ON

## 2013-10-10 MED ORDER — INSULIN REGULAR BOLUS VIA INFUSION
0.0000 [IU] | Freq: Three times a day (TID) | INTRAVENOUS | Status: DC
  Filled 2013-10-10: qty 10

## 2013-10-10 MED ORDER — METOCLOPRAMIDE HCL 5 MG/ML IJ SOLN
10.0000 mg | INTRAMUSCULAR | Status: AC
  Administered 2013-10-10: 10 mg via INTRAVENOUS
  Filled 2013-10-10: qty 2

## 2013-10-10 MED ORDER — OXYCODONE-ACETAMINOPHEN 5-325 MG PO TABS
1.0000 | ORAL_TABLET | Freq: Once | ORAL | Status: AC
  Administered 2013-10-10: 1 via ORAL
  Filled 2013-10-10 (×2): qty 1

## 2013-10-10 MED ORDER — MORPHINE SULFATE 4 MG/ML IJ SOLN
4.0000 mg | Freq: Once | INTRAMUSCULAR | Status: AC
  Administered 2013-10-10: 4 mg via INTRAVENOUS
  Filled 2013-10-10: qty 1

## 2013-10-10 MED ORDER — DEXTROSE 50 % IV SOLN: 25.0000 mL | INTRAVENOUS | Status: DC | PRN

## 2013-10-10 MED ORDER — SODIUM CHLORIDE 0.9 % IV BOLUS (SEPSIS)
1000.0000 mL | Freq: Once | INTRAVENOUS | Status: AC
  Administered 2013-10-10: 1000 mL via INTRAVENOUS

## 2013-10-10 MED ORDER — DEXTROSE-NACL 5-0.45 % IV SOLN: INTRAVENOUS | Status: DC

## 2013-10-10 MED ORDER — SODIUM CHLORIDE 0.9 % IV SOLN
INTRAVENOUS | Status: DC
Start: 2013-10-10 — End: 2013-10-10

## 2013-10-10 MED ORDER — SODIUM CHLORIDE 0.9 % IV SOLN
INTRAVENOUS | Status: DC
  Filled 2013-10-10: qty 1

## 2013-10-10 MED ORDER — SODIUM CHLORIDE 0.9 % IV BOLUS (SEPSIS)
500.0000 mL | Freq: Once | INTRAVENOUS | Status: AC
  Administered 2013-10-10: 500 mL via INTRAVENOUS

## 2013-10-10 MED ORDER — SODIUM CHLORIDE 0.9 % IV SOLN
1.0000 g | Freq: Once | INTRAVENOUS | Status: DC
  Filled 2013-10-10: qty 10

## 2013-10-10 NOTE — ED Provider Notes (Signed)
CSN: [id number]     Arrival date & time 10/10/13  1244 History   First MD Initiated Contact with Patient 10/10/13 1502     Chief Complaint  Patient presents with  . Abdominal Pain  . Emesis     (Consider location/radiation/quality/duration/timing/severity/associated sxs/prior Treatment) Patient is a 43 y.o. female presenting with abdominal pain and vomiting. The history is provided by the patient and medical records.  Abdominal Pain Associated symptoms: diarrhea, nausea and vomiting   Emesis Associated symptoms: abdominal pain and diarrhea    This is a 43 year old female with past medical history significant for hypertension, diabetes, gastroparesis, GERD, chronic pancreatitis, presenting to the ED for abdominal pain, nausea, and vomiting over the past 3 days. Patient was seen in emergency department 2 days ago for the same.  He states pain is localized to her epigastrium, described as sharp and stabbing. Pain worse with movement and palpation, some radiation to back.  She has had 10+ episodes of nonbloody, nonbilious emesis and is now dry heaving.  Patient states she has not tried to eat today, but did give herself her TPN via g-tube. Also notes some watery, non-bloody diarrhea.  No recent abx use.   She denies and fever or chills.  No urinary sx or vaginal complaints.  Denies any chest pain or SOB.  Pt tachycardic on arrival, VS otherwise stable.  Past Medical History  Diagnosis Date  . Hypertension   . 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 Gastroparesis due to DM   . GERD (gastroesophageal reflux disease)   . Coronary artery disease   . Diabetic neuropathy, painful   . Chronic pancreatitis   . Dyslipidemia   . MI (myocardial infarction)   . COPD (chronic obstructive pulmonary disease)   . Seizures   . Diverticulosis     s/p partial bowel resection   Past Surgical History  Procedure Laterality Date  . Abdominal hysterectomy    . Cholecystectomy    . Peg  tube x 4      feeding jejunostomies with PEG tubes  . Cesarean section    . Colon resection due to diverticulitis    . Back surgery    . Esophagogastroduodenoscopy N/A 12/27/2012    Procedure: ESOPHAGOGASTRODUODENOSCOPY (EGD);  Surgeon: Missy Sabins, MD;  Location: [facility];  Service: Endoscopy;  Laterality: N/A;  . Esophagogastroduodenoscopy (egd) with esophageal dilation N/A 02/19/2013    Procedure: ESOPHAGOGASTRODUODENOSCOPY (EGD) WITH ESOPHAGEAL DILATION;  Surgeon: Wonda Horner, MD;  Location: [facility];  Service: Endoscopy;  Laterality: N/A;  . Balloon dilation N/A 02/19/2013    Procedure: BALLOON DILATION;  Surgeon: Wonda Horner, MD;  Location: [facility];  Service: Endoscopy;  Laterality: N/A;  . Coronary stent placement      2 stents [place]  . Esophagogastroduodenoscopy N/A 09/13/2013    Procedure: ESOPHAGOGASTRODUODENOSCOPY (EGD);  Surgeon: Cleotis Nipper, MD;  Location: [facility];  Service: Endoscopy;  Laterality: N/A;   Family History  Problem Relation Age of Onset  . Endometrial cancer Mother   . Diabetes Mellitus II Maternal Grandmother   . Diabetes Mellitus II Mother   . Diabetes Mellitus II Sister   . Emphysema Mother   . Heart attack Father    History  Substance Use Topics  . Smoking status: Former Smoker -- 0.25 packs/day for 6 years    Types: Cigarettes, Cigars    Quit date: 08/01/2013  . Smokeless tobacco: Never Used  . Alcohol Use: No  OB History   Grav Para Term Preterm Abortions TAB SAB Ect Mult Living                 Review of Systems  Gastrointestinal: Positive for nausea, vomiting, abdominal pain and diarrhea.  All other systems reviewed and are negative.      Allergies  Cefadroxil; Compazine; Darvocet; Other; Zofran; Nsaids; and Penicillins  Home Medications   Current Outpatient Rx  Name  Route  Sig  Dispense  Refill  . busPIRone (BUSPAR) 5 MG tablet   Per Tube   Place 5 mg into feeding tube 3 (three) times daily.          . fentaNYL (DURAGESIC - DOSED MCG/HR) 25 MCG/HR patch   Transdermal   Place 25 mcg onto the skin every 3 (three) days.         . Fluticasone-Salmeterol (ADVAIR) 250-50 MCG/DOSE AEPB   Inhalation   Inhale 1 puff into the lungs 2 (two) times daily.   60 each   0   . gabapentin (NEURONTIN) 250 MG/5ML solution   Per Tube   Place 12 mLs (600 mg total) into feeding tube every 8 (eight) hours.   473 mL   0   . insulin glargine (LANTUS) 100 UNIT/ML injection   Subcutaneous   Inject 10 Units into the skin at bedtime.          . insulin lispro (HUMALOG) 100 UNIT/ML injection   Subcutaneous   Inject 0-9 Units into the skin 3 (three) times daily before meals. Sliding scale: 0-150=0 units, 151-200= 2 units, 201-250= 4 units, 251-300=6 units, 301-350=8 units, 351-400=10 units, >400 = 9 units         . labetalol (NORMODYNE) 200 MG tablet   Per Tube   Place 1 tablet (200 mg total) into feeding tube 2 (two) times daily.   60 tablet   0   . lisinopril (PRINIVIL,ZESTRIL) 10 MG tablet   Per Tube   Place 10 mg into feeding tube daily.          Marland Kitchen LORazepam (ATIVAN) 0.5 MG tablet   Oral   Take 0.5 mg by mouth every 4 (four) hours as needed (nausea).         . metoCLOPramide (REGLAN) 5 MG/5ML solution   Oral   Take 10 mLs (10 mg total) by mouth 3 (three) times daily before meals.   120 mL   0   . mirtazapine (REMERON) 15 MG tablet   Per Tube   Place 15 mg into feeding tube at bedtime.         . Multiple Vitamin (MULTIVITAMIN) LIQD   Per Tube   Place 5 mLs into feeding tube daily.   60 mL   1   . oxyCODONE (OXY IR/ROXICODONE) 5 MG immediate release tablet   Oral   Take 1 tablet (5 mg total) by mouth every 4 (four) hours as needed for moderate pain.   20 tablet   0   . pantoprazole sodium (PROTONIX) 40 mg/20 mL PACK   Per Tube   Place 20 mLs (40 mg total) into feeding tube 2 (two) times daily.   30 each   0   . promethazine (PHENERGAN) 25 MG tablet   Per  Tube   Place 1 tablet (25 mg total) into feeding tube every 6 (six) hours as needed for nausea or vomiting.   30 tablet   0   . sucralfate (CARAFATE) 1 GM/10ML suspension  Per Tube   Place 10 mLs (1 g total) into feeding tube 4 (four) times daily -  with meals and at bedtime.   420 mL   0   . vancomycin (VANCOCIN) 50 mg/mL oral solution   Per Tube   Place 2.5 mLs (125 mg total) into feeding tube every 6 (six) hours.   50 mL   0     125mg  per tube q6hrs, dispense 4 day supply   . zolpidem (AMBIEN) 10 MG tablet   Per Tube   Place 10 mg into feeding tube at bedtime.          BP 162/89  Pulse 101  Temp(Src) 98.5 F (36.9 C) (Oral)  Resp 22  SpO2 100%  Physical Exam  Nursing note and vitals reviewed. Constitutional: She is oriented to person, place, and time. She appears well-developed and well-nourished. No distress.  HENT:  Head: Normocephalic and atraumatic.  Mouth/Throat: Oropharynx is clear and moist.  Dry mucous membranes  Eyes: Conjunctivae and EOM are normal. Pupils are equal, round, and reactive to light.  Neck: Normal range of motion. Neck supple.  Cardiovascular: Regular rhythm and normal heart sounds.  Tachycardia present.   Pulmonary/Chest: Effort normal and breath sounds normal. No accessory muscle usage. Not tachypneic. No respiratory distress. She has no wheezes. She has no rhonchi.  Abdominal: Soft. Bowel sounds are normal. There is tenderness in the epigastric area.    Abdomen soft, non-distended, TTP epigastrium Multiple old and well-healed abdominal surgical incisions; G-tube in place without signs of infection  Musculoskeletal: Normal range of motion.  PICC line RUE, no cellulitic change or signs of infection  Neurological: She is alert and oriented to person, place, and time.  Skin: Skin is warm and dry. She is not diaphoretic.  Psychiatric: She has a normal mood and affect.    ED Course  Procedures (including critical care time) Labs  Review Labs Reviewed  CBC WITH DIFFERENTIAL - Abnormal; Notable for the following:    RBC 2.86 (*)    Hemoglobin 8.7 (*)    HCT 27.2 (*)    RDW 17.0 (*)    All other components within normal limits  COMPREHENSIVE METABOLIC PANEL - Abnormal; Notable for the following:    Sodium 121 (*)    Potassium >7.7 (*)    Chloride 90 (*)    CO2 16 (*)    Glucose, Bld 1201 (*)    Albumin 2.1 (*)    Total Bilirubin <0.2 (*)    All other components within normal limits  URINALYSIS, ROUTINE W REFLEX MICROSCOPIC - Abnormal; Notable for the following:    Glucose, UA >1000 (*)    Hgb urine dipstick SMALL (*)    Protein, ur 100 (*)    All other components within normal limits  URINE MICROSCOPIC-ADD ON - Abnormal; Notable for the following:    Bacteria, UA FEW (*)    Casts HYALINE CASTS (*)    All other components within normal limits  BLOOD GAS, VENOUS - Abnormal; Notable for the following:    pH, Ven 7.385 (*)    pCO2, Ven 40.6 (*)    All other components within normal limits  CBG MONITORING, ED - Abnormal; Notable for the following:    Glucose-Capillary 268 (*)    All other components within normal limits  I-STAT CHEM 8, ED - Abnormal; Notable for the following:    Glucose, Bld 246 (*)    Hemoglobin 9.9 (*)    HCT 29.0 (*)  All other components within normal limits  LIPASE, BLOOD   Imaging Review Dg Abd Acute W/chest  10/08/2013   CLINICAL DATA History of pancreatitis and gastric pairs is also COPD and diabetes ; PEG tube ; epigastric pain.  EXAM ACUTE ABDOMEN SERIES (ABDOMEN 2 VIEW & CHEST 1 VIEW)  COMPARISON DG ABD PORTABLE 1V dated 09/21/2013; DG CHEST 1V PORT dated 09/19/2013  FINDINGS The lungs are mildly hypoinflated. The interstitial markings are prominent but stable. The cardiac silhouette is top-normal in size. The pulmonary vascularity is not clearly engorged. A PICC line is in place via the right upper extremity with the tip in the region of the mid to distal SVC.  Within the  abdomen a moderate stool burden is present throughout the colon. There is a PEG tube present whose tip lies in the region of the transverse portion of the duodenum. There is stool and gas in the rectum. The bones are osteopenic. There are surgical clips in the gallbladder fossa.  IMPRESSION 1. Increased stool burden within the colon is present. There is no evidence of a small or large bowel obstruction. 2. The PEG tube is in reasonable position with the tip in the transverse portion of the duodenum. 3. There is no evidence of active cardiopulmonary disease.  SIGNATURE  Electronically Signed   By: David  Martinique   On: 10/08/2013 16:55     EKG Interpretation   Date/Time:  Thursday October 10 2013 14:18:55 EDT Ventricular Rate:  105 PR Interval:  127 QRS Duration: 79 QT Interval:  336 QTC Calculation: 444 R Axis:   -37 Text Interpretation:  Sinus tachycardia Left axis deviation Anterior  infarct, old No significant change since last tracing Confirmed by  HARRISON  MD, FORREST ([id number]) on 10/10/2013 2:37:29 PM      MDM   Final diagnoses:  Abdominal pain   EKG sinus tach, no acute ischemic changes.  Lab CMP with marked hyperkalemia, hyponatremia, and glucose of 1200, however CBG done 5 minutes ago only 268-- possible lab error or mislabeling as CMP was sent and processed at [facility] ([place]).  Will obtain chem 8 and VBG here to confirm.  Chem 8 and VBG WNL.  U/a with glucose but no ketones.  Pt has been fluid resuscitated while in the ED.  She remains afebrile, non-toxic appearing, NAD, and has had no active vomiting while in the ED today, tolerating PO-- cleared for discharge.  Repeat abdominal exam unchanged, however at this time i have low suspicion for atypical ACS or acute/surgical abdomen including but not limited to:  SBO, appendicitis, diverticulitis.  She will follow-up with her primary care physician.  Discussed plan with pt, she acknowledged understanding and agreed with plan of  care.  Discussed with Dr. Maryan Rued who agrees with assessment and plan of care.  Larene Pickett, PA-C 10/10/13 2203

## 2013-10-10 NOTE — ED Notes (Signed)
Bed: WA21 Expected date:  Expected time:  Means of arrival:  Comments: ems- abdominal pain, hx pancreatits

## 2013-10-10 NOTE — ED Notes (Signed)
Patient is alert and oriented x3.  She was given DC instructions and follow up visit instructions.  Patient gave verbal understanding. She was DC ambulatory under her own power to home.  V/S stable.  He was not showing any signs of distress on DC 

## 2013-10-10 NOTE — Progress Notes (Signed)
   CARE MANAGEMENT ED NOTE 10/10/2013  Patient:  [name], [name]   Account Number:  [id number]  Date Initiated:  10/10/2013  Documentation initiated by:  Jackelyn Poling  Subjective/Objective Assessment:   43 yr old medicare/medicaid of [place] patient hx gastroparesis, pancreatitis near syncopal episode at pcp office [facility] with [facility])     Subjective/Objective Assessment Detail:   Pending lab results and further evaluation at this time  Pt will need resumption of home health orders if d/c home     Action/Plan:   ED CM spoke Cyril Mourning of [facility]   Action/Plan Detail:   Anticipated DC Date:       Status Recommendation to Physician:   Result of Recommendation:    Other ED Services  Consult [facility] [place]  Other   [place]   Choice offered to / List presented to:    DME arranged  TUBE FEEDING PUMP  TUBE FEEDING     DME agency  [facility].    Status of service:  Completed, signed off  ED Comments:   ED Comments Detail:

## 2013-10-10 NOTE — ED Provider Notes (Signed)
Medical screening examination/treatment/procedure(s) were performed by non-physician practitioner and as supervising physician I was immediately available for consultation/collaboration.   EKG Interpretation   Date/Time:  Thursday October 10 2013 14:18:55 EDT Ventricular Rate:  105 PR Interval:  127 QRS Duration: 79 QT Interval:  336 QTC Calculation: 444 R Axis:   -37 Text Interpretation:  Sinus tachycardia Left axis deviation Anterior  infarct, old No significant change since last tracing Confirmed by  HARRISON  MD, FORREST ([id number]) on 10/10/2013 2:37:29 PM Also confirmed by  Maryan Rued  MD, Loree Fee ([id number])  on 10/10/2013 5:57:32 PM        Blanchie Dessert, MD 10/10/13 2356

## 2013-10-10 NOTE — Discharge Instructions (Signed)
Continue taking your medications as directed. Follow-up with your primary care physician Return to the ED for new or worsening symptoms.

## 2013-10-10 NOTE — ED Notes (Signed)
Pt has Hx gastroparesis, pancreatitis. Had near syncopal episode at doctors office today, c/o lightheadedness, n/v. Feeding tube for nutrition supplement. Seen in ED 2 days ago for same.

## 2013-10-11 NOTE — ED Provider Notes (Signed)
Medical screening examination/treatment/procedure(s) were conducted as a shared visit with non-physician practitioner(s) and myself.  I personally evaluated the patient during the encounter.   EKG Interpretation None      Patient with recurrent, chronic abd pain. No new symptoms or concerning exam findings. Nausea controlled, will discharge.  Ephraim Hamburger, MD 10/11/13 1145

## 2013-10-26 ENCOUNTER — Inpatient Hospital Stay (HOSPITAL_COMMUNITY)
Admission: EM | Admit: 2013-10-26 | Discharge: 2013-10-30 | DRG: 073 | Discharge status: Against Medical Advice | Payer: Medicare Other | Attending: Internal Medicine | Admitting: Internal Medicine

## 2013-10-26 ENCOUNTER — Encounter (HOSPITAL_COMMUNITY): Payer: Self-pay | Admitting: Emergency Medicine

## 2013-10-26 ENCOUNTER — Emergency Department (HOSPITAL_COMMUNITY): Payer: Medicare Other

## 2013-10-26 DIAGNOSIS — R4702 Dysphasia: Secondary | ICD-10-CM

## 2013-10-26 DIAGNOSIS — R1013 Epigastric pain: Secondary | ICD-10-CM

## 2013-10-26 DIAGNOSIS — IMO0002 Reserved for concepts with insufficient information to code with codable children: Secondary | ICD-10-CM

## 2013-10-26 DIAGNOSIS — K3184 Gastroparesis: Secondary | ICD-10-CM

## 2013-10-26 DIAGNOSIS — E1165 Type 2 diabetes mellitus with hyperglycemia: Secondary | ICD-10-CM | POA: Diagnosis present

## 2013-10-26 DIAGNOSIS — E1149 Type 2 diabetes mellitus with other diabetic neurological complication: Principal | ICD-10-CM | POA: Diagnosis present

## 2013-10-26 DIAGNOSIS — K5900 Constipation, unspecified: Secondary | ICD-10-CM

## 2013-10-26 DIAGNOSIS — K920 Hematemesis: Secondary | ICD-10-CM

## 2013-10-26 DIAGNOSIS — E878 Other disorders of electrolyte and fluid balance, not elsewhere classified: Secondary | ICD-10-CM

## 2013-10-26 DIAGNOSIS — Z87891 Personal history of nicotine dependence: Secondary | ICD-10-CM

## 2013-10-26 DIAGNOSIS — E871 Hypo-osmolality and hyponatremia: Secondary | ICD-10-CM

## 2013-10-26 DIAGNOSIS — R6510 Systemic inflammatory response syndrome (SIRS) of non-infectious origin without acute organ dysfunction: Secondary | ICD-10-CM | POA: Diagnosis present

## 2013-10-26 DIAGNOSIS — Y849 Medical procedure, unspecified as the cause of abnormal reaction of the patient, or of later complication, without mention of misadventure at the time of the procedure: Secondary | ICD-10-CM | POA: Diagnosis present

## 2013-10-26 DIAGNOSIS — D649 Anemia, unspecified: Secondary | ICD-10-CM | POA: Diagnosis present

## 2013-10-26 DIAGNOSIS — Z833 Family history of diabetes mellitus: Secondary | ICD-10-CM

## 2013-10-26 DIAGNOSIS — Z8249 Family history of ischemic heart disease and other diseases of the circulatory system: Secondary | ICD-10-CM

## 2013-10-26 DIAGNOSIS — R112 Nausea with vomiting, unspecified: Secondary | ICD-10-CM

## 2013-10-26 DIAGNOSIS — N390 Urinary tract infection, site not specified: Secondary | ICD-10-CM

## 2013-10-26 DIAGNOSIS — I2510 Atherosclerotic heart disease of native coronary artery without angina pectoris: Secondary | ICD-10-CM | POA: Diagnosis present

## 2013-10-26 DIAGNOSIS — F1193 Opioid use, unspecified with withdrawal: Secondary | ICD-10-CM

## 2013-10-26 DIAGNOSIS — I10 Essential (primary) hypertension: Secondary | ICD-10-CM

## 2013-10-26 DIAGNOSIS — Z79899 Other long term (current) drug therapy: Secondary | ICD-10-CM

## 2013-10-26 DIAGNOSIS — I252 Old myocardial infarction: Secondary | ICD-10-CM

## 2013-10-26 DIAGNOSIS — Z8049 Family history of malignant neoplasm of other genital organs: Secondary | ICD-10-CM

## 2013-10-26 DIAGNOSIS — Z794 Long term (current) use of insulin: Secondary | ICD-10-CM

## 2013-10-26 DIAGNOSIS — R000 Tachycardia, unspecified: Secondary | ICD-10-CM | POA: Diagnosis present

## 2013-10-26 DIAGNOSIS — E43 Unspecified severe protein-calorie malnutrition: Secondary | ICD-10-CM

## 2013-10-26 DIAGNOSIS — Z765 Malingerer [conscious simulation]: Secondary | ICD-10-CM

## 2013-10-26 DIAGNOSIS — J4489 Other specified chronic obstructive pulmonary disease: Secondary | ICD-10-CM | POA: Diagnosis present

## 2013-10-26 DIAGNOSIS — E876 Hypokalemia: Secondary | ICD-10-CM

## 2013-10-26 DIAGNOSIS — K567 Ileus, unspecified: Secondary | ICD-10-CM

## 2013-10-26 DIAGNOSIS — Z431 Encounter for attention to gastrostomy: Secondary | ICD-10-CM

## 2013-10-26 DIAGNOSIS — Z87898 Personal history of other specified conditions: Secondary | ICD-10-CM

## 2013-10-26 DIAGNOSIS — J449 Chronic obstructive pulmonary disease, unspecified: Secondary | ICD-10-CM | POA: Diagnosis present

## 2013-10-26 DIAGNOSIS — E785 Hyperlipidemia, unspecified: Secondary | ICD-10-CM

## 2013-10-26 DIAGNOSIS — R109 Unspecified abdominal pain: Secondary | ICD-10-CM

## 2013-10-26 DIAGNOSIS — K219 Gastro-esophageal reflux disease without esophagitis: Secondary | ICD-10-CM

## 2013-10-26 DIAGNOSIS — IMO0001 Reserved for inherently not codable concepts without codable children: Secondary | ICD-10-CM

## 2013-10-26 DIAGNOSIS — R739 Hyperglycemia, unspecified: Secondary | ICD-10-CM

## 2013-10-26 DIAGNOSIS — E1143 Type 2 diabetes mellitus with diabetic autonomic (poly)neuropathy: Secondary | ICD-10-CM

## 2013-10-26 DIAGNOSIS — E860 Dehydration: Secondary | ICD-10-CM

## 2013-10-26 DIAGNOSIS — F1910 Other psychoactive substance abuse, uncomplicated: Secondary | ICD-10-CM | POA: Diagnosis present

## 2013-10-26 DIAGNOSIS — R1110 Vomiting, unspecified: Secondary | ICD-10-CM

## 2013-10-26 DIAGNOSIS — R042 Hemoptysis: Secondary | ICD-10-CM

## 2013-10-26 DIAGNOSIS — D72829 Elevated white blood cell count, unspecified: Secondary | ICD-10-CM

## 2013-10-26 DIAGNOSIS — K9429 Other complications of gastrostomy: Secondary | ICD-10-CM | POA: Diagnosis present

## 2013-10-26 DIAGNOSIS — Z6828 Body mass index (BMI) 28.0-28.9, adult: Secondary | ICD-10-CM

## 2013-10-26 DIAGNOSIS — F1123 Opioid dependence with withdrawal: Secondary | ICD-10-CM

## 2013-10-26 DIAGNOSIS — G8929 Other chronic pain: Secondary | ICD-10-CM

## 2013-10-26 DIAGNOSIS — E1140 Type 2 diabetes mellitus with diabetic neuropathy, unspecified: Secondary | ICD-10-CM | POA: Diagnosis present

## 2013-10-26 DIAGNOSIS — E1142 Type 2 diabetes mellitus with diabetic polyneuropathy: Secondary | ICD-10-CM | POA: Diagnosis present

## 2013-10-26 DIAGNOSIS — Z9861 Coronary angioplasty status: Secondary | ICD-10-CM

## 2013-10-26 DIAGNOSIS — N179 Acute kidney failure, unspecified: Secondary | ICD-10-CM

## 2013-10-26 LAB — CBC WITH DIFFERENTIAL/PLATELET
Basophils Absolute: 0 10*3/uL (ref 0.0–0.1)
Basophils Relative: 0 % (ref 0–1)
EOS ABS: 0 10*3/uL (ref 0.0–0.7)
EOS PCT: 0 % (ref 0–5)
HCT: 35.2 % — ABNORMAL LOW (ref 36.0–46.0)
Hemoglobin: 11.7 g/dL — ABNORMAL LOW (ref 12.0–15.0)
LYMPHS ABS: 2 10*3/uL (ref 0.7–4.0)
LYMPHS PCT: 12 % (ref 12–46)
MCH: 28.3 pg (ref 26.0–34.0)
MCHC: 33.2 g/dL (ref 30.0–36.0)
MCV: 85.2 fL (ref 78.0–100.0)
MONOS PCT: 3 % (ref 3–12)
Monocytes Absolute: 0.4 10*3/uL (ref 0.1–1.0)
NEUTROS PCT: 85 % — AB (ref 43–77)
Neutro Abs: 14 10*3/uL — ABNORMAL HIGH (ref 1.7–7.7)
PLATELETS: 262 10*3/uL (ref 150–400)
RBC: 4.13 MIL/uL (ref 3.87–5.11)
RDW: 15.1 % (ref 11.5–15.5)
WBC: 16.4 10*3/uL — AB (ref 4.0–10.5)

## 2013-10-26 LAB — COMPREHENSIVE METABOLIC PANEL
ALBUMIN: 2.7 g/dL — AB (ref 3.5–5.2)
ALT: 62 U/L — ABNORMAL HIGH (ref 0–35)
ALT: 46 U/L — ABNORMAL HIGH (ref 0–35)
AST: 46 U/L — ABNORMAL HIGH (ref 0–37)
AST: 32 U/L (ref 0–37)
Albumin: 3.2 g/dL — ABNORMAL LOW (ref 3.5–5.2)
Alkaline Phosphatase: 172 U/L — ABNORMAL HIGH (ref 39–117)
Alkaline Phosphatase: 143 U/L — ABNORMAL HIGH (ref 39–117)
BUN: 15 mg/dL (ref 6–23)
BUN: 14 mg/dL (ref 6–23)
CALCIUM: 8.9 mg/dL (ref 8.4–10.5)
CO2: 25 meq/L (ref 19–32)
CO2: 25 mEq/L (ref 19–32)
Calcium: 10.1 mg/dL (ref 8.4–10.5)
Chloride: 92 mEq/L — ABNORMAL LOW (ref 96–112)
Chloride: 95 mEq/L — ABNORMAL LOW (ref 96–112)
Creatinine, Ser: 0.6 mg/dL (ref 0.50–1.10)
Creatinine, Ser: 0.54 mg/dL (ref 0.50–1.10)
GFR calc non Af Amer: >90 mL/min (ref >90)
GFR calc non Af Amer: >90 mL/min (ref >90)
GLUCOSE: 487 mg/dL — AB (ref 70–99)
Glucose, Bld: 377 mg/dL — ABNORMAL HIGH (ref 70–99)
POTASSIUM: 4.2 meq/L (ref 3.7–5.3)
Potassium: 3.9 mEq/L (ref 3.7–5.3)
SODIUM: 133 meq/L — AB (ref 137–147)
SODIUM: 133 meq/L — AB (ref 137–147)
TOTAL PROTEIN: 8.6 g/dL — AB (ref 6.0–8.3)
Total Bilirubin: 0.3 mg/dL (ref 0.3–1.2)
Total Bilirubin: 0.2 mg/dL — ABNORMAL LOW (ref 0.3–1.2)
Total Protein: 7.5 g/dL (ref 6.0–8.3)

## 2013-10-26 LAB — URINE MICROSCOPIC-ADD ON

## 2013-10-26 LAB — URINALYSIS, ROUTINE W REFLEX MICROSCOPIC
Bilirubin Urine: NEGATIVE
Glucose, UA: >1000 mg/dL — AB
KETONES UR: NEGATIVE mg/dL
Leukocytes, UA: NEGATIVE
NITRITE: NEGATIVE
Protein, ur: >300 mg/dL — AB
Specific Gravity, Urine: 1.029 (ref 1.005–1.030)
UROBILINOGEN UA: 0.2 mg/dL (ref 0.0–1.0)
pH: 7 (ref 5.0–8.0)

## 2013-10-26 LAB — MAGNESIUM: Magnesium: 1.4 mg/dL — ABNORMAL LOW (ref 1.5–2.5)

## 2013-10-26 LAB — LACTIC ACID, PLASMA: LACTIC ACID, VENOUS: 1.4 mmol/L (ref 0.5–2.2)

## 2013-10-26 LAB — CBG MONITORING, ED
Glucose-Capillary: 432 mg/dL — ABNORMAL HIGH (ref 70–99)
Glucose-Capillary: 346 mg/dL — ABNORMAL HIGH (ref 70–99)

## 2013-10-26 LAB — PHOSPHORUS: PHOSPHORUS: 3.5 mg/dL (ref 2.3–4.6)

## 2013-10-26 LAB — GLUCOSE, CAPILLARY
Glucose-Capillary: 219 mg/dL — ABNORMAL HIGH (ref 70–99)
Glucose-Capillary: 230 mg/dL — ABNORMAL HIGH (ref 70–99)
Glucose-Capillary: 252 mg/dL — ABNORMAL HIGH (ref 70–99)
Glucose-Capillary: 334 mg/dL — ABNORMAL HIGH (ref 70–99)

## 2013-10-26 LAB — PREALBUMIN: Prealbumin: 25 mg/dL (ref 17.0–34.0)

## 2013-10-26 LAB — LIPASE, BLOOD: LIPASE: 33 U/L (ref 11–59)

## 2013-10-26 LAB — TRIGLYCERIDES: Triglycerides: 187 mg/dL — ABNORMAL HIGH (ref <150)

## 2013-10-26 MED ORDER — MOMETASONE FURO-FORMOTEROL FUM 100-5 MCG/ACT IN AERO
2.0000 | INHALATION_SPRAY | Freq: Two times a day (BID) | RESPIRATORY_TRACT | Status: DC
  Administered 2013-10-26 – 2013-10-30 (×9): 2 via RESPIRATORY_TRACT
  Filled 2013-10-26: qty 8.8

## 2013-10-26 MED ORDER — LORAZEPAM 2 MG/ML IJ SOLN
1.0000 mg | Freq: Once | INTRAMUSCULAR | Status: AC
Start: 2013-10-26 — End: 2013-10-26
  Administered 2013-10-26: 1 mg via INTRAVENOUS
  Filled 2013-10-26: qty 1

## 2013-10-26 MED ORDER — METOCLOPRAMIDE HCL 10 MG PO TABS
10.0000 mg | ORAL_TABLET | Freq: Once | ORAL | Status: DC
  Filled 2013-10-26: qty 1

## 2013-10-26 MED ORDER — PROMETHAZINE HCL 25 MG/ML IJ SOLN
25.0000 mg | Freq: Once | INTRAMUSCULAR | Status: AC
  Administered 2013-10-26: 25 mg via INTRAVENOUS
  Filled 2013-10-26: qty 1

## 2013-10-26 MED ORDER — INSULIN ASPART 100 UNIT/ML ~~LOC~~ SOLN
0.0000 [IU] | SUBCUTANEOUS | Status: DC
  Administered 2013-10-26: 5 [IU] via SUBCUTANEOUS
  Administered 2013-10-26: 11 [IU] via SUBCUTANEOUS
  Administered 2013-10-26: 5 [IU] via SUBCUTANEOUS
  Administered 2013-10-27: 3 [IU] via SUBCUTANEOUS
  Administered 2013-10-27: 5 [IU] via SUBCUTANEOUS
  Administered 2013-10-27 (×3): 8 [IU] via SUBCUTANEOUS
  Administered 2013-10-28 (×3): 3 [IU] via SUBCUTANEOUS
  Administered 2013-10-28 (×2): 5 [IU] via SUBCUTANEOUS
  Administered 2013-10-28 – 2013-10-29 (×4): 3 [IU] via SUBCUTANEOUS
  Administered 2013-10-29: 8 [IU] via SUBCUTANEOUS
  Administered 2013-10-29: 5 [IU] via SUBCUTANEOUS
  Administered 2013-10-29: 3 [IU] via SUBCUTANEOUS
  Administered 2013-10-30: 8 [IU] via SUBCUTANEOUS
  Administered 2013-10-30 (×2): 2 [IU] via SUBCUTANEOUS
  Administered 2013-10-30: 5 [IU] via SUBCUTANEOUS

## 2013-10-26 MED ORDER — METOCLOPRAMIDE HCL 10 MG PO TABS: 10.0000 mg | ORAL_TABLET | Freq: Four times a day (QID) | ORAL | Status: DC | PRN

## 2013-10-26 MED ORDER — METOCLOPRAMIDE HCL 5 MG/ML IJ SOLN
10.0000 mg | Freq: Once | INTRAMUSCULAR | Status: AC
  Administered 2013-10-26: 10 mg via INTRAVENOUS
  Filled 2013-10-26: qty 2

## 2013-10-26 MED ORDER — ENOXAPARIN SODIUM 40 MG/0.4ML ~~LOC~~ SOLN
40.0000 mg | SUBCUTANEOUS | Status: DC
  Administered 2013-10-26 – 2013-10-29 (×4): 40 mg via SUBCUTANEOUS
  Filled 2013-10-26 (×5): qty 0.4

## 2013-10-26 MED ORDER — METOCLOPRAMIDE HCL 5 MG/ML IJ SOLN: 5.0000 mg | INTRAMUSCULAR | Status: DC | PRN

## 2013-10-26 MED ORDER — TRACE MINERALS CR-CU-F-FE-I-MN-MO-SE-ZN IV SOLN
INTRAVENOUS | Status: AC
  Administered 2013-10-26: 17:00:00 via INTRAVENOUS
  Filled 2013-10-26: qty 2000

## 2013-10-26 MED ORDER — HYDROMORPHONE HCL PF 1 MG/ML IJ SOLN
1.0000 mg | INTRAMUSCULAR | Status: AC | PRN
  Administered 2013-10-26 – 2013-10-27 (×3): 1 mg via INTRAVENOUS
  Filled 2013-10-26 (×3): qty 1

## 2013-10-26 MED ORDER — SODIUM CHLORIDE 0.9 % IV BOLUS (SEPSIS)
1000.0000 mL | Freq: Once | INTRAVENOUS | Status: AC
  Administered 2013-10-26: 1000 mL via INTRAVENOUS

## 2013-10-26 MED ORDER — HYDROMORPHONE HCL PF 1 MG/ML IJ SOLN
1.0000 mg | Freq: Once | INTRAMUSCULAR | Status: DC
Start: 2013-10-26 — End: 2013-10-26

## 2013-10-26 MED ORDER — SODIUM CHLORIDE 0.9 % IV SOLN
INTRAVENOUS | Status: DC
  Administered 2013-10-28: 22:00:00 via INTRAVENOUS

## 2013-10-26 MED ORDER — HYDRALAZINE HCL 20 MG/ML IJ SOLN
10.0000 mg | Freq: Three times a day (TID) | INTRAMUSCULAR | Status: DC
  Filled 2013-10-26 (×4): qty 0.5

## 2013-10-26 MED ORDER — SODIUM CHLORIDE 0.9 % IV SOLN
INTRAVENOUS | Status: AC
  Administered 2013-10-26: 12:00:00 via INTRAVENOUS

## 2013-10-26 MED ORDER — METOCLOPRAMIDE HCL 5 MG/ML IJ SOLN
5.0000 mg | INTRAMUSCULAR | Status: DC
  Administered 2013-10-26 – 2013-10-30 (×23): 5 mg via INTRAVENOUS
  Filled 2013-10-26 (×2): qty 1
  Filled 2013-10-26 (×3): qty 2
  Filled 2013-10-26 (×5): qty 1
  Filled 2013-10-26 (×2): qty 2
  Filled 2013-10-26: qty 1
  Filled 2013-10-26: qty 2
  Filled 2013-10-26 (×7): qty 1
  Filled 2013-10-26: qty 2
  Filled 2013-10-26 (×10): qty 1

## 2013-10-26 MED ORDER — SODIUM CHLORIDE 0.9 % IV BOLUS (SEPSIS)
2000.0000 mL | Freq: Once | INTRAVENOUS | Status: AC
  Administered 2013-10-26: 2000 mL via INTRAVENOUS

## 2013-10-26 MED ORDER — FENTANYL 25 MCG/HR TD PT72
25.0000 ug | MEDICATED_PATCH | TRANSDERMAL | Status: DC
  Administered 2013-10-26 – 2013-10-29 (×2): 25 ug via TRANSDERMAL
  Filled 2013-10-26 (×2): qty 1

## 2013-10-26 MED ORDER — MORPHINE SULFATE 4 MG/ML IJ SOLN
4.0000 mg | Freq: Once | INTRAMUSCULAR | Status: AC
  Administered 2013-10-26: 4 mg via INTRAVENOUS
  Filled 2013-10-26: qty 1

## 2013-10-26 MED ORDER — FENTANYL CITRATE 0.05 MG/ML IJ SOLN
100.0000 ug | Freq: Once | INTRAMUSCULAR | Status: AC
  Administered 2013-10-26: 100 ug via INTRAVENOUS
  Filled 2013-10-26: qty 2

## 2013-10-26 MED ORDER — PANTOPRAZOLE SODIUM 40 MG IV SOLR: 40.0000 mg | Freq: Two times a day (BID) | INTRAVENOUS | Status: DC

## 2013-10-26 MED ORDER — HYDROMORPHONE HCL PF 2 MG/ML IJ SOLN
2.0000 mg | INTRAMUSCULAR | Status: DC | PRN
  Administered 2013-10-26 – 2013-10-28 (×16): 2 mg via INTRAVENOUS
  Filled 2013-10-26 (×16): qty 1

## 2013-10-26 MED ORDER — FAMOTIDINE IN NACL 20-0.9 MG/50ML-% IV SOLN
20.0000 mg | Freq: Two times a day (BID) | INTRAVENOUS | Status: DC
  Administered 2013-10-26 – 2013-10-30 (×8): 20 mg via INTRAVENOUS
  Filled 2013-10-26 (×9): qty 50

## 2013-10-26 MED ORDER — INSULIN GLARGINE 100 UNIT/ML ~~LOC~~ SOLN
10.0000 [IU] | Freq: Every day | SUBCUTANEOUS | Status: DC
  Filled 2013-10-26: qty 0.1

## 2013-10-26 MED ORDER — SODIUM CHLORIDE 0.9 % IJ SOLN
10.0000 mL | INTRAMUSCULAR | Status: DC | PRN
  Administered 2013-10-26: 30 mL
  Administered 2013-10-27 – 2013-10-29 (×3): 10 mL

## 2013-10-26 MED ORDER — INSULIN GLARGINE 100 UNIT/ML ~~LOC~~ SOLN
20.0000 [IU] | Freq: Every day | SUBCUTANEOUS | Status: DC
  Administered 2013-10-26 – 2013-10-29 (×4): 20 [IU] via SUBCUTANEOUS
  Filled 2013-10-26 (×6): qty 0.2

## 2013-10-26 MED ORDER — LABETALOL HCL 5 MG/ML IV SOLN
20.0000 mg | Freq: Two times a day (BID) | INTRAVENOUS | Status: DC
  Administered 2013-10-26 – 2013-10-29 (×7): 20 mg via INTRAVENOUS
  Filled 2013-10-26 (×8): qty 4

## 2013-10-26 MED ORDER — FAT EMULSION 20 % IV EMUL
240.0000 mL | INTRAVENOUS | Status: AC
  Administered 2013-10-26: 240 mL via INTRAVENOUS
  Filled 2013-10-26: qty 250

## 2013-10-26 MED ORDER — HYDRALAZINE HCL 20 MG/ML IJ SOLN
10.0000 mg | Freq: Four times a day (QID) | INTRAMUSCULAR | Status: DC | PRN
  Filled 2013-10-26: qty 0.5

## 2013-10-26 NOTE — ED Provider Notes (Addendum)
7:55 AM I was called to evaluate patient as she continued to complain of epigastric pain. And continued to have dry heaves after discharge. Patient reports to me that she feels no better since treatment in the emergency department. She treated herself with Phenergan per G-tube prior to coming here however continued to vomit On exam patient is chronically ill-appearing. No distress lungs clear auscultation heart regular rate and rhythm mildly tach cardiac abdomen obese multiple surgical scars, hypoactive bowel sounds, tender at epigastrium. No guarding no rigidity no rebound. Patient feels improved after treatment with intravenous Phenergan and morphine ordered by me In light of intractable vomiting and hyperglycemia and pain not well-controlled patient to be placed on med surge bed for continuing therapy. Spoke with Dr,.  Dhungle who will arrange for 23 hour observation. Results for orders placed during the hospital encounter of 10/26/13  CBC WITH DIFFERENTIAL      Result Value Ref Range   WBC 16.4 (*) 4.0 - 10.5 K/uL   RBC 4.13  3.87 - 5.11 MIL/uL   Hemoglobin 11.7 (*) 12.0 - 15.0 g/dL   HCT 35.2 (*) 36.0 - 46.0 %   MCV 85.2  78.0 - 100.0 fL   MCH 28.3  26.0 - 34.0 pg   MCHC 33.2  30.0 - 36.0 g/dL   RDW 15.1  11.5 - 15.5 %   Platelets 262  150 - 400 K/uL   Neutrophils Relative % 85 (*) 43 - 77 %   Neutro Abs 14.0 (*) 1.7 - 7.7 K/uL   Lymphocytes Relative 12  12 - 46 %   Lymphs Abs 2.0  0.7 - 4.0 K/uL   Monocytes Relative 3  3 - 12 %   Monocytes Absolute 0.4  0.1 - 1.0 K/uL   Eosinophils Relative 0  0 - 5 %   Eosinophils Absolute 0.0  0.0 - 0.7 K/uL   Basophils Relative 0  0 - 1 %   Basophils Absolute 0.0  0.0 - 0.1 K/uL  COMPREHENSIVE METABOLIC PANEL      Result Value Ref Range   Sodium 133 (*) 137 - 147 mEq/L   Potassium 4.2  3.7 - 5.3 mEq/L   Chloride 92 (*) 96 - 112 mEq/L   CO2 25  19 - 32 mEq/L   Glucose, Bld 487 (*) 70 - 99 mg/dL   BUN 15  6 - 23 mg/dL   Creatinine, Ser 0.60   0.50 - 1.10 mg/dL   Calcium 10.1  8.4 - 10.5 mg/dL   Total Protein 8.6 (*) 6.0 - 8.3 g/dL   Albumin 3.2 (*) 3.5 - 5.2 g/dL   AST 46 (*) 0 - 37 U/L   ALT 62 (*) 0 - 35 U/L   Alkaline Phosphatase 172 (*) 39 - 117 U/L   Total Bilirubin 0.3  0.3 - 1.2 mg/dL   GFR calc non Af Amer >90  >90 mL/min   GFR calc Af Amer >90  >90 mL/min  LIPASE, BLOOD      Result Value Ref Range   Lipase 33  11 - 59 U/L  CBG MONITORING, ED      Result Value Ref Range   Glucose-Capillary 432 (*) 70 - 99 mg/dL   Comment 1 Notify RN    CBG MONITORING, ED      Result Value Ref Range   Glucose-Capillary 346 (*) 70 - 99 mg/dL   Dg Abd Acute W/chest  10/26/2013   CLINICAL DATA:  Left abdominal pain.  EXAM: ACUTE  ABDOMEN SERIES (ABDOMEN 2 VIEW & CHEST 1 VIEW)  COMPARISON:  10/08/2013  FINDINGS: PICC line tip in the lower SVC. Lungs are clear bilaterally. Heart and mediastinum are within normal limits. The patient has a GJ feeding tube. The jejunal portion is now within the stomach and no longer the small bowel. Postsurgical bowel changes in the abdomen and post spinal surgery in the lower lumbar spine. There are a few air-fluid levels in the abdomen but these findings are nonspecific. Patient has a coronary artery stent.  IMPRESSION: Air-fluid levels in the abdomen are nonspecific. An early or partial bowel obstruction cannot be excluded.  The [place] feeding tube is no longer in the small bowel. The tip appears to be in the stomach.  No acute chest findings.   Electronically Signed   By: Markus Daft M.D.   On: 10/26/2013 08:31   Dg Abd Acute W/chest  10/08/2013   CLINICAL DATA History of pancreatitis and gastric pairs is also COPD and diabetes ; PEG tube ; epigastric pain.  EXAM ACUTE ABDOMEN SERIES (ABDOMEN 2 VIEW & CHEST 1 VIEW)  COMPARISON DG ABD PORTABLE 1V dated 09/21/2013; DG CHEST 1V PORT dated 09/19/2013  FINDINGS The lungs are mildly hypoinflated. The interstitial markings are prominent but stable. The cardiac silhouette is  top-normal in size. The pulmonary vascularity is not clearly engorged. A PICC line is in place via the right upper extremity with the tip in the region of the mid to distal SVC.  Within the abdomen a moderate stool burden is present throughout the colon. There is a PEG tube present whose tip lies in the region of the transverse portion of the duodenum. There is stool and gas in the rectum. The bones are osteopenic. There are surgical clips in the gallbladder fossa.  IMPRESSION 1. Increased stool burden within the colon is present. There is no evidence of a small or large bowel obstruction. 2. The PEG tube is in reasonable position with the tip in the transverse portion of the duodenum. 3. There is no evidence of active cardiopulmonary disease.  SIGNATURE  Electronically Signed   By: David  Martinique   On: 10/08/2013 16:55   xrays viewed by me   Orlie Dakin, MD 10/26/13 [id number]  Orlie Dakin, MD 10/26/13 [phone]

## 2013-10-26 NOTE — ED Notes (Signed)
Pt reports nausea and request something for nausea. [place] notified.

## 2013-10-26 NOTE — H&P (Addendum)
[facility] History and Physical  [name] [id number] DOB: 17-Aug-1970 DOA: 10/26/2013  Referring physician: Dr Winfred Leeds PCP: [name], MD   Chief Complaint:  abdominal pain with nausea and vomiting x 1 day  HPI:  43 year old female with history of uncontrolled diabetes mellitus with diabetic gastroparesis status post gastrostomy tube placement, chronic abdominal pain, CAD status post MI with stent placed, hypertension, COPD, hyperlipidemia was admitted one month back with uncontrolled hypoglycemia and malpositioning of her jejunal tube for which she underwent GJ tube replacement . She however failed to feed upon multiple attempts and was started on TPN after a PICC line was placed.  She also had C. difficile colitis and completed or 2 weeks course of therapy with oral vancomycin. Regarding her diabetic gastroparesis she follows with [name] at [facility] and is known scheduled Reglan 3 times a day. Patient came to the ED this morning with several episodes of nausea and vomiting of clear liquids since yesterday morning. She also had severe epigastric pain radiating to the back. She explains this pain to be similar to her gastroparesis symptoms. She reports occasional small amount of hemoptysis as well. She had one episode of diarrhea this morning. She denies fevers but had chills. Patient denies headache, dizziness, fever, chest pain, palpitations, SOB,  or urinary symptoms. She denies any sick contacts.  Course in the ED Patient was afebrile. She was tachycardic up to 135 and appeared dehydrated. Blood pressure was 149/99 mmHg. Blood work done showed leukocytosis with WBC of 16.4 thousand, hemoglobin was 11.7 and hematocrit of 35.2. Platelets were normal. Chemistry showed sodium of 133, potassium of 4.2, chloride of 92, CO2 of 25 with normal renal function. She had an anion gap of 16 and blood glucose of 487. An x-ray of her abdomen showed GJ feeding tube to have been misplaced  and tip  in the stomach. Also suggest a possible early or partial bowel obstruction.  Patient given 2 L IV normal saline bolus in the ED and some antiemetics however her symptoms did not improve and thus [facility] was consulted for admission to medical floor.  Review of Systems:  Constitutional: Denies fever, chills +, diaphoresis+,  Fatigue+.  HEENT: Denies ear pain, congestion, sore throat, rhinorrhea, sneezing, mouth sores, trouble swallowing, neck pain, neck stiffness  Respiratory: Denies SOB, DOE, cough, chest tightness,  and wheezing.   Cardiovascular: Denies chest pain, palpitations and leg swelling.  Gastrointestinal:  nausea, vomiting, abdominal pain, diarrhea, occasional hemoptysis. Denies Constipation, blood in stool and abdominal distention.  Genitourinary: Denies dysuria, urgency, frequency, hematuria, flank pain and difficulty urinating.  Endocrine: Denies: hot or cold intolerance, reports polyuria, polydipsia. Musculoskeletal: Denies myalgias, back pain, joint swelling, arthralgias and gait problem.  Skin: Denies pallor, rash and wound.  Neurological: Generalized weakness, Denies dizziness, seizures, syncope,  light-headedness, numbness and headaches.  Psychiatric/Behavioral: Denies confusion,    Past Medical History  Diagnosis Date  . Hypertension   . 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 Gastroparesis due to DM   . GERD (gastroesophageal reflux disease)   . Coronary artery disease   . Diabetic neuropathy, painful   . Chronic pancreatitis   . Dyslipidemia   . MI (myocardial infarction)   . COPD (chronic obstructive pulmonary disease)   . Seizures   . Diverticulosis     s/p partial bowel resection   Past Surgical History  Procedure Laterality Date  . Abdominal hysterectomy    . Cholecystectomy    . Peg tube  x 4      feeding jejunostomies with PEG tubes  . Cesarean section    . Colon resection due to diverticulitis    . Back  surgery    . Esophagogastroduodenoscopy N/A 12/27/2012    Procedure: ESOPHAGOGASTRODUODENOSCOPY (EGD);  Surgeon: Missy Sabins, MD;  Location: [facility];  Service: Endoscopy;  Laterality: N/A;  . Esophagogastroduodenoscopy (egd) with esophageal dilation N/A 02/19/2013    Procedure: ESOPHAGOGASTRODUODENOSCOPY (EGD) WITH ESOPHAGEAL DILATION;  Surgeon: Wonda Horner, MD;  Location: [facility];  Service: Endoscopy;  Laterality: N/A;  . Balloon dilation N/A 02/19/2013    Procedure: BALLOON DILATION;  Surgeon: Wonda Horner, MD;  Location: [facility];  Service: Endoscopy;  Laterality: N/A;  . Coronary stent placement      2 stents [place]  . Esophagogastroduodenoscopy N/A 09/13/2013    Procedure: ESOPHAGOGASTRODUODENOSCOPY (EGD);  Surgeon: Cleotis Nipper, MD;  Location: [facility];  Service: Endoscopy;  Laterality: N/A;   Social History:  reports that she quit smoking about 2 months ago. Her smoking use included Cigarettes and Cigars. She has a 1.5 pack-year smoking history. She has never used smokeless tobacco. She reports that she does not drink alcohol or use illicit drugs.  Allergies  Allergen Reactions  . Other Hives and Shortness Of Breath    Bell Peppers   . Cefadroxil Hives    tolerated cefepime per [facility]  . Compazine [Prochlorperazine Edisylate] Hives    Tolerates promethazine  . Darvocet [Propoxyphene N-Acetaminophen] Hives  . Zofran [Ondansetron Hcl] Hives  . Nsaids Hives  . Penicillins Hives    Family History  Problem Relation Age of Onset  . Endometrial cancer Mother   . Diabetes Mellitus II Maternal Grandmother   . Diabetes Mellitus II Mother   . Diabetes Mellitus II Sister   . Emphysema Mother   . Heart attack Father     Prior to Admission medications   Medication Sig Start Date End Date Taking? Authorizing Provider  busPIRone (BUSPAR) 5 MG tablet Place 5 mg into feeding tube 3 (three) times daily.   Yes Historical Provider, MD  dicyclomine  (BENTYL) 10 MG/5ML syrup Place 10 mg into feeding tube 4 (four) times daily -  before meals and at bedtime.  10/21/13 10/21/14 Yes Historical Provider, MD  fentaNYL (DURAGESIC - DOSED MCG/HR) 25 MCG/HR patch Place 25 mcg onto the skin every 3 (three) days.   Yes Historical Provider, MD  Fluticasone-Salmeterol (ADVAIR) 250-50 MCG/DOSE AEPB Inhale 1 puff into the lungs 2 (two) times daily. 12/02/11  Yes Robbie Lis, MD  gabapentin (NEURONTIN) 250 MG/5ML solution Place 12 mLs (600 mg total) into feeding tube every 8 (eight) hours. 08/19/13  Yes Allie Bossier, MD  insulin glargine (LANTUS) 100 UNIT/ML injection Inject 10 Units into the skin at bedtime.    Yes Historical Provider, MD  insulin lispro (HUMALOG) 100 UNIT/ML injection Inject 0-9 Units into the skin 3 (three) times daily before meals. Sliding scale: 0-150=0 units, 151-200= 2 units, 201-250= 4 units, 251-300=6 units, 301-350=8 units, 351-400=10 units, >400 = 9 units   Yes Historical Provider, MD  labetalol (TRANDATE) 40 mg/mL SUSP Place 200 mg into feeding tube every 12 (twelve) hours. 10/21/13  Yes Historical Provider, MD  lisinopril (PRINIVIL,ZESTRIL) 10 MG tablet Place 10 mg into feeding tube daily.  03/29/13  Yes Historical Provider, MD  LORazepam (ATIVAN) 0.5 MG tablet Give 0.5 mg by tube every 4 (four) hours as needed (nausea).    Yes Historical  Provider, MD  metoCLOPramide (REGLAN) 10 MG/10ML SOLN Place 10 mg into feeding tube 3 (three) times daily before meals.   Yes Historical Provider, MD  mirtazapine (REMERON) 15 MG tablet Place 15 mg into feeding tube at bedtime.   Yes Historical Provider, MD  Multiple Vitamin (MULTIVITAMIN) LIQD Place 5 mLs into feeding tube daily. 05/07/13  Yes Hosie Poisson, MD  pantoprazole sodium (PROTONIX) 40 mg/20 mL PACK Place 20 mLs (40 mg total) into feeding tube 2 (two) times daily. 08/19/13  Yes Allie Bossier, MD  sucralfate (CARAFATE) 1 GM/10ML suspension Place 10 mLs (1 g total) into feeding tube 4 (four) times  daily -  with meals and at bedtime. 05/13/13  Yes Hoy Morn, MD  sulfamethoxazole-trimethoprim (BACTRIM,SEPTRA) 200-40 MG/5ML suspension Place 20 mLs into feeding tube 2 (two) times daily. 10 day course starting 3/23 10/21/13 10/31/13 Yes Historical Provider, MD  venlafaxine (EFFEXOR) 75 MG tablet Give 75 mg by tube daily.  10/21/13 01/19/14 Yes Historical Provider, MD  zolpidem (AMBIEN) 10 MG tablet Place 10 mg into feeding tube at bedtime. 05/22/13  Yes Historical Provider, MD  metoCLOPramide (REGLAN) 10 MG tablet Take 1 tablet (10 mg total) by mouth every 6 (six) hours as needed for nausea. 10/26/13   Hoy Morn, MD     Physical Exam:  Filed Vitals:   10/26/13 0615 10/26/13 0735 10/26/13 0830 10/26/13 0924  BP: 149/99 143/90 176/103 177/95  Pulse: 113  122 117  Temp:  98.6 F (37 C)    TempSrc:  Oral    Resp: 19 17 20 19   SpO2: 95% 100% 100% 99%    Constitutional: Vital signs reviewed.  Patient is middle aged female in distress with pain and vomiting HEENT: no pallor, no icterus, dry oral mucosa,  Cardiovascular:  S1 normal, S2 tachycardic  no MRG Chest: CTAB, no wheezes, rales, or rhonchi Abdominal: Soft.  non-distended, bowel sounds are normal, diffuse tenderness to palpation, has GJ tube,  no masses, organomegaly, or guarding present.  GU: no CVA tenderness Ext: warm, no edema Neurological: A&O x3, non focal  Labs on Admission:  Basic Metabolic Panel:  Recent Labs Lab 10/26/13 0535  NA 133*  K 4.2  CL 92*  CO2 25  GLUCOSE 487*  BUN 15  CREATININE 0.60  CALCIUM 10.1   Liver Function Tests:  Recent Labs Lab 10/26/13 0535  AST 46*  ALT 62*  ALKPHOS 172*  BILITOT 0.3  PROT 8.6*  ALBUMIN 3.2*    Recent Labs Lab 10/26/13 0535  LIPASE 33   No results found for this basename: AMMONIA,  in the last 168 hours CBC:  Recent Labs Lab 10/26/13 0535  WBC 16.4*  NEUTROABS 14.0*  HGB 11.7*  HCT 35.2*  MCV 85.2  PLT 262   Cardiac Enzymes: No results  found for this basename: CKTOTAL, CKMB, CKMBINDEX, TROPONINI,  in the last 168 hours BNP: No components found with this basename: POCBNP,  CBG:  Recent Labs Lab 10/26/13 0500 10/26/13 0757  GLUCAP 432* 346*    Radiological Exams on Admission: Dg Abd Acute W/chest  10/26/2013   CLINICAL DATA:  Left abdominal pain.  EXAM: ACUTE ABDOMEN SERIES (ABDOMEN 2 VIEW & CHEST 1 VIEW)  COMPARISON:  10/08/2013  FINDINGS: PICC line tip in the lower SVC. Lungs are clear bilaterally. Heart and mediastinum are within normal limits. The patient has a GJ feeding tube. The jejunal portion is now within the stomach and no longer the small bowel. Postsurgical bowel  changes in the abdomen and post spinal surgery in the lower lumbar spine. There are a few air-fluid levels in the abdomen but these findings are nonspecific. Patient has a coronary artery stent.  IMPRESSION: Air-fluid levels in the abdomen are nonspecific. An early or partial bowel obstruction cannot be excluded.  The [place] feeding tube is no longer in the small bowel. The tip appears to be in the stomach.  No acute chest findings.   Electronically Signed   By: Markus Daft M.D.   On: 10/26/2013 08:31    EKG: Pending  Assessment/Plan  Principal Problem:  SIRS with  Diabetic gastroparesis Admit to med surg  patient on TPN at home which i will resume. Has G J tube which she uses for meds only. switch meds to IV for now.  IV hydration with NS Scheduled reglan 10 mg once followed by 4 mg q 4-6 hrs. Pain control with prn dilaudid. contiue fentanyl patch.  serial abdominal exam. abd x ray on admission comments on possible early partial bowel obstruction. She has good bowel sounds and had BM this am.lipase and LFTs normal.  Reports diarrhea x 1 this am. Check stool for c diff. ( was on bactrim as outpt recently) Check blood cx. UA unremarkable. Check lactic acid level.   Active Problems: Mispositioned GJ tube  x ray reports GJ tube tip to be in the  stomach. Will request IR to reposition he tube.     Intractable nausea and vomiting  History of severe diabetic gastroparesis. Continue scheduled Reglan    GERD (gastroesophageal reflux disease) I will place her on IV PPI twice a day     Hypertension I will place her on IV labetalol 20 mg twice a day and when necessary IV hydralazine  10 mg every 6 hours.    DM (diabetes mellitus), , uncontrolled with hypoglycemia Blood glucose quite elevated. Will place her on sliding scale insulin he did resume home dose  Lantus .dose increased to 20 units at bedtime. Last A1c of 8.6. -anion gap closed on repeat BMET. Ketones negative and urine. Lactic acid normal.    Dehydration Received 2 L IV normal saline in the ED. I will place her on IV normal saline at 125 cc per hour      Protein-calorie malnutrition, severe Continue TNA     Chronic abdominal pain Pain control with prn dilaudid       Diet:NPO/ TPN  DVT prophylaxis: sq lovenox   Code Status: full code Family Communication:None at bedside Disposition Plan: Home once improved  Logyn Dedominicis, [facility] Pager [phone]  Total time spent on admission :70 minutes  If 7PM-7AM, please contact night-coverage [email or web address] Password [facility] 10/26/2013, 10:49 AM

## 2013-10-26 NOTE — Progress Notes (Signed)
PARENTERAL NUTRITION CONSULT NOTE - INITIAL  Pharmacy Consult for TNA Indication: intolerance to enteral feeding  Allergies  Allergen Reactions  . Other Hives and Shortness Of Breath    Bell Peppers   . Cefadroxil Hives    tolerated cefepime per [facility]  . Compazine [Prochlorperazine Edisylate] Hives    Tolerates promethazine  . Darvocet [Propoxyphene N-Acetaminophen] Hives  . Zofran [Ondansetron Hcl] Hives  . Nsaids Hives  . Penicillins Hives    Patient Measurements: Last weight: 70.5kg on 09/26/11 Usual Weight: 64.5kg per patient on 09/20/13  Vital Signs: Temp: 98.6 F (37 C) (03/28 0735) Temp src: Oral (03/28 0735) BP: 177/95 mmHg (03/28 0924) Pulse Rate: 117 (03/28 0924) Intake/Output from previous day:   Intake/Output from this shift:    Labs:  Recent Labs  10/26/13 0535  WBC 16.4*  HGB 11.7*  HCT 35.2*  PLT 262     Recent Labs  10/26/13 0535  NA 133*  K 4.2  CL 92*  CO2 25  GLUCOSE 487*  BUN 15  CREATININE 0.60  CALCIUM 10.1  PROT 8.6*  ALBUMIN 3.2*  AST 46*  ALT 62*  ALKPHOS 172*  BILITOT 0.3   The CrCl is unknown because both a height and weight (above a minimum accepted value) are required for this calculation.    Recent Labs  10/26/13 0500 10/26/13 0757  GLUCAP 432* 346*    Medical History: Past Medical History  Diagnosis Date  . Hypertension   . 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 Gastroparesis due to DM   . GERD (gastroesophageal reflux disease)   . Coronary artery disease   . Diabetic neuropathy, painful   . Chronic pancreatitis   . Dyslipidemia   . MI (myocardial infarction)   . COPD (chronic obstructive pulmonary disease)   . Seizures   . Diverticulosis     s/p partial bowel resection    Medications:  Scheduled:  . enoxaparin (LOVENOX) injection  40 mg Subcutaneous Q24H  . fentaNYL  25 mcg Transdermal Q72H  . hydrALAZINE  10 mg Intravenous 3 times per day  . insulin aspart   0-15 Units Subcutaneous 6 times per day  . insulin glargine  10 Units Subcutaneous QHS  . labetalol  20 mg Intravenous Q12H  . metoCLOPramide (REGLAN) injection  10 mg Intravenous Once  . metoCLOPramide (REGLAN) injection  5 mg Intravenous 6 times per day  . mometasone-formoterol  2 puff Inhalation BID   Infusions:  . sodium chloride      Insulin Requirements in the past 24 hours:  No usage. Mod SSI and Lantus 10 units qHS entered   Current Nutrition:  Diet: NPO IVF: NS @ 125/hr  TNA formula from [facility]: Dextrose: 230 gm/d, Protein: 92 gm/d, Lipids daily, Kcal: 1849/d Electrolytes: Na 157 mEq/d, Phos 24 mmol/d, K+ 80 mEq/d, Ca: 10 mEq/d, Cl: 211.8 mEq/d, Mag: 20 mEq/d Trace elements: Addemel N: 10 ml/d Given as 18 hour cyclic infusion: 1hr ramp up, 16hr infusion, 1 hr ramp down  Nutritional Goals:  RD recs pending RD recs last admission: Kcal: 1600-1800/d, Protein: 80-95 gram/d, Fluid: 2100 ml//daily   Clinimix E 5/15 @ 75 ml/hr + IVF 20% at 10 ml/hr will provide: 90 g protein/day, 1758 Kcal/day, 1.8 L/day Clinimix electrolytes: Ca 4.5 mEq/L, Acetate 80 mEq/L, Na 35 mEq/L, Cl 39 mEq/L, Phosphate 15 mmol/L, K+ 30 mEq/L, Magnesium 5 mEq/L  Assessment: 38 yoF admitted 3/28 with recurrent abdominal pain. Patient has a G  J tube for medications that requires repositioning by IR. Patient is on home TNA for her malnutrition secondary to her severe retching and nausea vomiting which is a chronic issue for this patient. She has a Hx DM and sugars >400 on admission. Pharmacy has been consulted to dose TNA while inpatient. There is no TNA bag with the patient on admission. Will not start D10 infusion with hyperglycemia, will start TNA as continuous infusion and not as cyclic infusion as on at home  Labs:  Electrolytes: Na low at 133 (unable to adjust in TNA), other lytes WNL Renal Function: SCr 0.6- stable at patient's baseline Hepatic Function:  slight elevation in AST/ALT/Alk Phos, Tbili  WNL Pre-Albumin: pending Triglycerides: pending CBGs: 346 on admit  TNA access: PICC placed 09/17/13 TNA day: continued from PTA  Plan:  At 1800 today:  Start Clinimix E 5/15 @ 75 mL/hr (goal rate) 20% fat emulsion at 10 ml/hr.  TNA to contain standard multivitamins and trace elements daily to match home formula despite national shortage Continue CBG checks q4h and moderate SSI.  Decrease IVF to 20 mL/hr TNA labs this afternoon Cmet, Mag, Phos in AM to assess electrolyte status on inpatient TNA TNA lab panel on Mondays & Thursdays.  F/u daily.  Thank you for the consult.  Johny Drilling, PharmD, BCPS Pager: [phone] Pharmacy: [phone] 10/26/2013 12:26 PM

## 2013-10-26 NOTE — ED Notes (Addendum)
Pt dry heaving at present time. Pt reports a spike in pain 10/10 to LUQ of abdomen with dry heaving.

## 2013-10-26 NOTE — ED Provider Notes (Signed)
CSN: [id number]     Arrival date & time 10/26/13  [id number] History   First MD Initiated Contact with Patient 10/26/13 0451     Chief Complaint  Patient presents with  . Abdominal Pain  . Emesis  . Hyperglycemia     HPI Patient is a long-standing history of recurrent abdominal pain.  She's history gastroparesis.  She is scheduled for upcoming celiac plexus block by pain institute in [place].  She is also on fentanyl patches as prescribed by their office.  She states is currently out of her fentanyl patch.  She's receiving TPN through her right upper extremity PICC line for her malnutrition secondary to her severe retching and nausea vomiting which is a chronic issue for this patient.  She is followed by [name], gastroparesis specialist at [facility].  She denies fevers and chills.  No hematemesis.  No diarrhea.  No melena or hematochezia.  She reports her pain is epigastric which is typical for her.  Her pain is moderate to severe in severity.  She is a diabetic and presents with elevated blood sugar in the 400s.  She states compliance with her diabetes medications.   Past Medical History  Diagnosis Date  . Hypertension   . 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 Gastroparesis due to DM   . GERD (gastroesophageal reflux disease)   . Coronary artery disease   . Diabetic neuropathy, painful   . Chronic pancreatitis   . Dyslipidemia   . MI (myocardial infarction)   . COPD (chronic obstructive pulmonary disease)   . Seizures   . Diverticulosis     s/p partial bowel resection   Past Surgical History  Procedure Laterality Date  . Abdominal hysterectomy    . Cholecystectomy    . Peg tube x 4      feeding jejunostomies with PEG tubes  . Cesarean section    . Colon resection due to diverticulitis    . Back surgery    . Esophagogastroduodenoscopy N/A 12/27/2012    Procedure: ESOPHAGOGASTRODUODENOSCOPY (EGD);  Surgeon: Missy Sabins, MD;  Location:  [facility];  Service: Endoscopy;  Laterality: N/A;  . Esophagogastroduodenoscopy (egd) with esophageal dilation N/A 02/19/2013    Procedure: ESOPHAGOGASTRODUODENOSCOPY (EGD) WITH ESOPHAGEAL DILATION;  Surgeon: Wonda Horner, MD;  Location: [facility];  Service: Endoscopy;  Laterality: N/A;  . Balloon dilation N/A 02/19/2013    Procedure: BALLOON DILATION;  Surgeon: Wonda Horner, MD;  Location: [facility];  Service: Endoscopy;  Laterality: N/A;  . Coronary stent placement      2 st[place]  . Esophagogastroduodenoscopy N/A 09/13/2013    Procedure: ESOPHAGOGASTRODUODENOSCOPY (EGD);  Surgeon: Cleotis Nipper, MD;  Location: [facility];  Service: Endoscopy;  Laterality: N/A;   Family History  Problem Relation Age of Onset  . Endometrial cancer Mother   . Diabetes Mellitus II Maternal Grandmother   . Diabetes Mellitus II Mother   . Diabetes Mellitus II Sister   . Emphysema Mother   . Heart attack Father    History  Substance Use Topics  . Smoking status: Former Smoker -- 0.25 packs/day for 6 years    Types: Cigarettes, Cigars    Quit date: 08/01/2013  . Smokeless tobacco: Never Used  . Alcohol Use: No   OB History   Grav Para Term Preterm Abortions TAB SAB Ect Mult Living                 Review  of Systems  All other systems reviewed and are negative.      Allergies  Other; Cefadroxil; Compazine; Darvocet; Zofran; Nsaids; and Penicillins  Home Medications   Current Outpatient Rx  Name  Route  Sig  Dispense  Refill  . busPIRone (BUSPAR) 5 MG tablet   Per Tube   Place 5 mg into feeding tube 3 (three) times daily.         Marland Kitchen dicyclomine (BENTYL) 10 MG/5ML syrup   Per Tube   Place 10 mg into feeding tube 4 (four) times daily -  before meals and at bedtime.          . fentaNYL (DURAGESIC - DOSED MCG/HR) 25 MCG/HR patch   Transdermal   Place 25 mcg onto the skin every 3 (three) days.         . Fluticasone-Salmeterol (ADVAIR) 250-50 MCG/DOSE AEPB    Inhalation   Inhale 1 puff into the lungs 2 (two) times daily.   60 each   0   . gabapentin (NEURONTIN) 250 MG/5ML solution   Per Tube   Place 12 mLs (600 mg total) into feeding tube every 8 (eight) hours.   473 mL   0   . insulin glargine (LANTUS) 100 UNIT/ML injection   Subcutaneous   Inject 10 Units into the skin at bedtime.          . insulin lispro (HUMALOG) 100 UNIT/ML injection   Subcutaneous   Inject 0-9 Units into the skin 3 (three) times daily before meals. Sliding scale: 0-150=0 units, 151-200= 2 units, 201-250= 4 units, 251-300=6 units, 301-350=8 units, 351-400=10 units, >400 = 9 units         . labetalol (TRANDATE) 40 mg/mL SUSP   Per Tube   Place 200 mg into feeding tube every 12 (twelve) hours.         Marland Kitchen lisinopril (PRINIVIL,ZESTRIL) 10 MG tablet   Per Tube   Place 10 mg into feeding tube daily.          Marland Kitchen LORazepam (ATIVAN) 0.5 MG tablet   Tube   Give 0.5 mg by tube every 4 (four) hours as needed (nausea).          . metoCLOPramide (REGLAN) 10 MG/10ML SOLN   Per Tube   Place 10 mg into feeding tube 3 (three) times daily before meals.         . mirtazapine (REMERON) 15 MG tablet   Per Tube   Place 15 mg into feeding tube at bedtime.         . Multiple Vitamin (MULTIVITAMIN) LIQD   Per Tube   Place 5 mLs into feeding tube daily.   60 mL   1   . pantoprazole sodium (PROTONIX) 40 mg/20 mL PACK   Per Tube   Place 20 mLs (40 mg total) into feeding tube 2 (two) times daily.   30 each   0   . sucralfate (CARAFATE) 1 GM/10ML suspension   Per Tube   Place 10 mLs (1 g total) into feeding tube 4 (four) times daily -  with meals and at bedtime.   420 mL   0   . sulfamethoxazole-trimethoprim (BACTRIM,SEPTRA) 200-40 MG/5ML suspension   Per Tube   Place 20 mLs into feeding tube 2 (two) times daily. 10 day course starting 3/23         . venlafaxine (EFFEXOR) 75 MG tablet   Tube   Give 75 mg by tube daily.          Marland Kitchen  zolpidem (AMBIEN)  10 MG tablet   Per Tube   Place 10 mg into feeding tube at bedtime.         . metoCLOPramide (REGLAN) 10 MG tablet   Oral   Take 1 tablet (10 mg total) by mouth every 6 (six) hours as needed for nausea.   30 tablet   0    BP 149/99  Pulse 113  Temp(Src) 98.5 F (36.9 C) (Oral)  Resp 19  SpO2 95% Physical Exam  Nursing note and vitals reviewed. Constitutional: She is oriented to person, place, and time. She appears well-developed and well-nourished. No distress.  HENT:  Head: Normocephalic and atraumatic.  Eyes: EOM are normal.  Neck: Normal range of motion.  Cardiovascular: Normal rate, regular rhythm and normal heart sounds.   Pulmonary/Chest: Effort normal and breath sounds normal.  Abdominal: Soft. She exhibits no distension.  Mild epigastric tenderness without guarding or rebound.  No perineal signs.  No lower abdominal tenderness  Musculoskeletal: Normal range of motion.  Neurological: She is alert and oriented to person, place, and time.  Skin: Skin is warm and dry.  Psychiatric: She has a normal mood and affect. Judgment normal.    ED Course  Procedures (including critical care time) Labs Review Labs Reviewed  CBC WITH DIFFERENTIAL - Abnormal; Notable for the following:    WBC 16.4 (*)    Hemoglobin 11.7 (*)    HCT 35.2 (*)    Neutrophils Relative % 85 (*)    Neutro Abs 14.0 (*)    All other components within normal limits  COMPREHENSIVE METABOLIC PANEL - Abnormal; Notable for the following:    Sodium 133 (*)    Chloride 92 (*)    Glucose, Bld 487 (*)    Total Protein 8.6 (*)    Albumin 3.2 (*)    AST 46 (*)    ALT 62 (*)    Alkaline Phosphatase 172 (*)    All other components within normal limits  CBG MONITORING, ED - Abnormal; Notable for the following:    Glucose-Capillary 432 (*)    All other components within normal limits  LIPASE, BLOOD   Imaging Review No results found.   EKG Interpretation None      MDM   Final diagnoses:   Chronic abdominal pain  Nausea & vomiting  Gastroparesis    7:07 AM Patient with some improvement in her symptoms at this time.  This is a long-standing chronic issue and sounds somewhat debilitating for this patient who is now on TPN with scheduled upcoming celiac plexus block.  At this time her pain is improved.  She has not vomited anymore in the emergency department.  She has TPN at home.  Have asked that she followup with her GI Dr. and her pain physicians in [place].  She is currently out of her fentanyl patches and this may be playing a role in her nausea and vomiting.  Have asked that she speak with her pain physicians in [place].  She'll not be prescribed opioids from the emergency department    Hoy Morn, MD 10/26/13 [phone]

## 2013-10-26 NOTE — ED Notes (Signed)
Gastrostomy site cleaned with sterile saline and dried. Dry Gauze and tape applied around site for comfort.

## 2013-10-26 NOTE — ED Notes (Signed)
Bed: [id number] Expected date:  Expected time:  Means of arrival:  Comments: EMS abs pain, hyperglycemia

## 2013-10-27 ENCOUNTER — Inpatient Hospital Stay (HOSPITAL_COMMUNITY): Payer: Medicare Other

## 2013-10-27 DIAGNOSIS — R109 Unspecified abdominal pain: Secondary | ICD-10-CM

## 2013-10-27 LAB — CBC
HCT: 24.1 % — ABNORMAL LOW (ref 36.0–46.0)
Hemoglobin: 8.1 g/dL — ABNORMAL LOW (ref 12.0–15.0)
MCH: 29.1 pg (ref 26.0–34.0)
MCHC: 33.6 g/dL (ref 30.0–36.0)
MCV: 86.7 fL (ref 78.0–100.0)
PLATELETS: 175 10*3/uL (ref 150–400)
RBC: 2.78 MIL/uL — ABNORMAL LOW (ref 3.87–5.11)
RDW: 15 % (ref 11.5–15.5)
WBC: 9.3 10*3/uL (ref 4.0–10.5)

## 2013-10-27 LAB — GLUCOSE, CAPILLARY
GLUCOSE-CAPILLARY: 241 mg/dL — AB (ref 70–99)
Glucose-Capillary: 198 mg/dL — ABNORMAL HIGH (ref 70–99)
Glucose-Capillary: 205 mg/dL — ABNORMAL HIGH (ref 70–99)
Glucose-Capillary: 222 mg/dL — ABNORMAL HIGH (ref 70–99)
Glucose-Capillary: 256 mg/dL — ABNORMAL HIGH (ref 70–99)
Glucose-Capillary: 267 mg/dL — ABNORMAL HIGH (ref 70–99)

## 2013-10-27 LAB — DIFFERENTIAL
BASOS PCT: 0 % (ref 0–1)
Basophils Absolute: 0 10*3/uL (ref 0.0–0.1)
EOS PCT: 1 % (ref 0–5)
Eosinophils Absolute: 0.1 10*3/uL (ref 0.0–0.7)
Lymphocytes Relative: 33 % (ref 12–46)
Lymphs Abs: 3.1 10*3/uL (ref 0.7–4.0)
MONO ABS: 0.6 10*3/uL (ref 0.1–1.0)
Monocytes Relative: 7 % (ref 3–12)
NEUTROS ABS: 5.6 10*3/uL (ref 1.7–7.7)
Neutrophils Relative %: 60 % (ref 43–77)

## 2013-10-27 LAB — COMPREHENSIVE METABOLIC PANEL
ALBUMIN: 2.2 g/dL — AB (ref 3.5–5.2)
ALT: 30 U/L (ref 0–35)
AST: 24 U/L (ref 0–37)
Alkaline Phosphatase: 99 U/L (ref 39–117)
BUN: 26 mg/dL — ABNORMAL HIGH (ref 6–23)
CALCIUM: 8.2 mg/dL — AB (ref 8.4–10.5)
CHLORIDE: 102 meq/L (ref 96–112)
CO2: 24 meq/L (ref 19–32)
Creatinine, Ser: 0.9 mg/dL (ref 0.50–1.10)
GFR calc Af Amer: 89 mL/min — ABNORMAL LOW (ref >90)
GFR calc non Af Amer: 77 mL/min — ABNORMAL LOW (ref >90)
Glucose, Bld: 239 mg/dL — ABNORMAL HIGH (ref 70–99)
Potassium: 3.8 mEq/L (ref 3.7–5.3)
SODIUM: 134 meq/L — AB (ref 137–147)
Total Bilirubin: <0.2 mg/dL — ABNORMAL LOW (ref 0.3–1.2)
Total Protein: 5.7 g/dL — ABNORMAL LOW (ref 6.0–8.3)

## 2013-10-27 LAB — MAGNESIUM: Magnesium: 1.5 mg/dL (ref 1.5–2.5)

## 2013-10-27 LAB — PHOSPHORUS: Phosphorus: 4.6 mg/dL (ref 2.3–4.6)

## 2013-10-27 MED ORDER — PROMETHAZINE HCL 25 MG/ML IJ SOLN
12.5000 mg | Freq: Four times a day (QID) | INTRAMUSCULAR | Status: DC | PRN
  Administered 2013-10-27 – 2013-10-30 (×11): 12.5 mg via INTRAVENOUS
  Filled 2013-10-27 (×13): qty 1

## 2013-10-27 MED ORDER — SODIUM CHLORIDE 0.9 % IV SOLN
INTRAVENOUS | Status: DC
Start: 2013-10-27 — End: 2013-10-29
  Administered 2013-10-27: 20:00:00 via INTRAVENOUS

## 2013-10-27 MED ORDER — DIPHENHYDRAMINE HCL 50 MG/ML IJ SOLN
12.5000 mg | Freq: Once | INTRAMUSCULAR | Status: AC
  Administered 2013-10-27: 12.5 mg via INTRAVENOUS
  Filled 2013-10-27: qty 1

## 2013-10-27 MED ORDER — FAT EMULSION 20 % IV EMUL
240.0000 mL | INTRAVENOUS | Status: AC
  Administered 2013-10-27: 240 mL via INTRAVENOUS
  Filled 2013-10-27: qty 250

## 2013-10-27 MED ORDER — TRACE MINERALS CR-CU-F-FE-I-MN-MO-SE-ZN IV SOLN
INTRAVENOUS | Status: AC
  Administered 2013-10-27: 18:00:00 via INTRAVENOUS
  Filled 2013-10-27: qty 2000

## 2013-10-27 NOTE — Progress Notes (Signed)
INITIAL NUTRITION ASSESSMENT  DOCUMENTATION CODES Per approved criteria  -Not Applicable   INTERVENTION:  Continue Clinimix E 5/15 @ 75 ml/hr + IVF 20% at 10 ml/hr will provide: 90 g protein/day, 1758 Kcal/day, 1.8 L/day  Monitor with Pharmacy  NUTRITION DIAGNOSIS: Inadequate oral intake related to altered GI status as evidenced by severe gastroparesis and NPO status.  Goal: Patient to meet >90% estimated needs with TPN.  Monitor:  TPN with pharmacy.  Reason for Assessment:  TPN consult  43 y.o. female  Admitting Dx: Diabetic gastroparesis  ASSESSMENT: Patient with frequent admits.  Hx of uncontrolled type 1 DM with diabetic gastroparesis s/p G-J tube.   She has been unable to tolerate TF  On the past admit 1 month ago.    Patient was admitted with N/V with clear liquids and was receiving TPN at home.  Per IR, Jejunal limb has retracted back into the stomach but with no reflux into the esophagus.  Potential plan to reposition jejunal limb.        Height: Ht Readings from Last 1 Encounters:  09/13/13 5\' 4"  (1.626 m)    Weight: Wt Readings from Last 1 Encounters:  09/25/13 155 lb 6.8 oz (70.5 kg)    Ideal Body Weight: 120 lbs  % Ideal Body Weight: 129  Wt Readings from Last 10 Encounters:  09/25/13 155 lb 6.8 oz (70.5 kg)  09/25/13 155 lb 6.8 oz (70.5 kg)  08/14/13 144 lb 4.8 oz (65.454 kg)  08/03/13 152 lb 5.4 oz (69.1 kg)  05/27/13 141 lb 12.1 oz (64.3 kg)  05/07/13 142 lb 13.7 oz (64.8 kg)  04/11/13 135 lb 2.3 oz (61.3 kg)  02/17/13 139 lb 1.8 oz (63.1 kg)  02/17/13 139 lb 1.8 oz (63.1 kg)  02/06/13 141 lb 1.5 oz (64 kg)    Usual Body Weight: 149 lbs  % Usual Body Weight: 104  BMI:  27  Estimated Nutritional Needs: Kcal: 1600-1800 Protein: 80-95 gm Fluid: 2.1L daily  Skin: intact  Diet Order: NPO  EDUCATION NEEDS: -No education needs identified at this time   Intake/Output Summary (Last 24 hours) at 10/27/13 1238 Last data filed at  10/27/13 0600  Gross per 24 hour  Intake 2128.5 ml  Output    800 ml  Net 1328.5 ml     Labs:   Recent Labs Lab 10/26/13 0535 10/26/13 1145 10/27/13 0447  NA 133* 133* 134*  K 4.2 3.9 3.8  CL 92* 95* 102  CO2 25 25 24   BUN 15 14 26*  CREATININE 0.60 0.54 0.90  CALCIUM 10.1 8.9 8.2*  MG  --  1.4* 1.5  PHOS  --  3.5 4.6  GLUCOSE 487* 377* 239*    CBG (last 3)   Recent Labs  10/27/13 0336 10/27/13 0759 10/27/13 1226  GLUCAP 241* 198* 256*    Scheduled Meds: . enoxaparin (LOVENOX) injection  40 mg Subcutaneous Q24H  . famotidine (PEPCID) IV  20 mg Intravenous Q12H  . fentaNYL  25 mcg Transdermal Q72H  . insulin aspart  0-15 Units Subcutaneous 6 times per day  . insulin glargine  20 Units Subcutaneous QHS  . labetalol  20 mg Intravenous Q12H  . metoCLOPramide (REGLAN) injection  5 mg Intravenous 6 times per day  . mometasone-formoterol  2 puff Inhalation BID    Continuous Infusions: . sodium chloride 20 mL/hr at 10/26/13 1800  . sodium chloride    . Marland KitchenTPN (CLINIMIX-E) Adult 75 mL/hr at 10/26/13 1900   And  .  fat emulsion 480 kcal (10/26/13 1900)  . Marland KitchenTPN (CLINIMIX-E) Adult     And  . fat emulsion      Past Medical History  Diagnosis Date  . Hypertension   . 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 Gastroparesis due to DM   . GERD (gastroesophageal reflux disease)   . Coronary artery disease   . Diabetic neuropathy, painful   . Chronic pancreatitis   . Dyslipidemia   . MI (myocardial infarction)   . COPD (chronic obstructive pulmonary disease)   . Seizures   . Diverticulosis     s/p partial bowel resection    Past Surgical History  Procedure Laterality Date  . Abdominal hysterectomy    . Cholecystectomy    . Peg tube x 4      feeding jejunostomies with PEG tubes  . Cesarean section    . Colon resection due to diverticulitis    . Back surgery    . Esophagogastroduodenoscopy N/A 12/27/2012    Procedure:  ESOPHAGOGASTRODUODENOSCOPY (EGD);  Surgeon: Missy Sabins, MD;  Location: [facility];  Service: Endoscopy;  Laterality: N/A;  . Esophagogastroduodenoscopy (egd) with esophageal dilation N/A 02/19/2013    Procedure: ESOPHAGOGASTRODUODENOSCOPY (EGD) WITH ESOPHAGEAL DILATION;  Surgeon: Wonda Horner, MD;  Location: [facility];  Service: Endoscopy;  Laterality: N/A;  . Balloon dilation N/A 02/19/2013    Procedure: BALLOON DILATION;  Surgeon: Wonda Horner, MD;  Location: [facility];  Service: Endoscopy;  Laterality: N/A;  . Coronary stent placement      2 [place]  . Esophagogastroduodenoscopy N/A 09/13/2013    Procedure: ESOPHAGOGASTRODUODENOSCOPY (EGD);  Surgeon: Cleotis Nipper, MD;  Location: [facility];  Service: Endoscopy;  Laterality: N/A;    Antonieta Iba, RD, LDN Clinical Inpatient Dietitian Pager:  [phone] Weekend and after hours pager:  [phone]

## 2013-10-27 NOTE — Progress Notes (Signed)
[facility] PROGRESS NOTE  [name] [id number] DOB: [birth year] DOA: 10/26/2013 PCP: [name], MD   Brief narrative 43 year old female with history of uncontrolled diabetes mellitus with diabetic gastroparesis status post gastrostomy tube placement, chronic abdominal pain, 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 Patient dehydrated with elevated blood glucose in ED.   Assessment/Plan:  SIRS with Diabetic gastroparesis  Improving.continue TPN Has G J tube which she uses for meds only. switched meds to IV for now.  IV hydration with NS  Scheduled reglan 10 mg once followed by 4 mg q 4-6 hrs. Pain control with prn dilaudid. contiue fentanyl patch.  serial abdominal exam. abd x ray on admission comments on possible early partial bowel obstruction. Resolved in am X ray. cdiff pending ( was cdiff pos last admission recently and  treated for 2 weeks. No diarrhea since admission) . Follow  blood cx. UA unremarkable.  Elevated  lactic acid level resolved .   Active Problems:  Mispositioned GJ tube  x ray reports GJ tube tip to be in the stomach. Will request IR to reposition he tube.   Intractable nausea and vomiting  History of severe diabetic gastroparesis. Continue scheduled Reglan   GERD (gastroesophageal reflux disease)   continue IV PPI BID   Hypertension  - on IV labetalol 20 mg twice a day and when necessary IV hydralazine 10 mg every 6 hours.   DM (diabetes mellitus), , uncontrolled with hypoglycemia  Elevated blood glucose on presentation with midl AG. Now improving.  on sliding scale insulin. Lantus dose increased to 20 units at bedtime. Last A1c of 8.6.   Dehydration  Cont IV NS  Protein-calorie malnutrition, severe  Continue TNA   Chronic abdominal pain  Pain control  with prn dilaudid   Diet:NPO/ TPN   DVT prophylaxis: sq lovenox  Code Status: FULL Family Communication: none at bedside Disposition Plan: home once improved   Consultants:  IR  Procedures:  none  Antibiotics:  none  HPI/Subjective: Seen and examined.   Objective: Filed Vitals:   10/27/13 0544  BP: 151/89  Pulse: 93  Temp: 98 F (36.7 C)  Resp: 16    Intake/Output Summary (Last 24 hours) at 10/27/13 1110 Last data filed at 10/27/13 0600  Gross per 24 hour  Intake 2128.5 ml  Output    800 ml  Net 1328.5 ml   There were no vitals filed for this visit.  Exam: Gen: NAD HEENT: no pallor, no icterus, dry oral mucosa,  Cardiovascular: S1 normal, S2 tachycardic no MRG  Chest: CTAB, no wheezes, rales, or rhonchi  Abdominal: Soft. non-distended, bowel sounds are normal, diffuse tenderness to palpation, has GJ tube, no masses, organomegaly, or guarding present.  GU: no CVA tenderness Ext: warm, no edema Neurological: A&O x3, non focal   Data Reviewed: Basic Metabolic Panel:  Recent Labs Lab 10/26/13 0535 10/26/13 1145 10/27/13 0447  NA 133* 133* 134*  K 4.2 3.9 3.8  CL 92* 95* 102  CO2 25 25 24   GLUCOSE 487* 377* 239*  BUN 15 14 26*  CREATININE 0.60 0.54 0.90  CALCIUM 10.1 8.9 8.2*  MG  --  1.4* 1.5  PHOS  --  3.5 4.6   Liver Function Tests:  Recent Labs Lab 10/26/13 0535 10/26/13 1145 10/27/13 0447  AST 46* 32 24  ALT 62* 46*  30  ALKPHOS 172* 143* 99  BILITOT 0.3 0.2* <0.2*  PROT 8.6* 7.5 5.7*  ALBUMIN 3.2* 2.7* 2.2*    Recent Labs Lab 10/26/13 0535  LIPASE 33   No results found for this basename: AMMONIA,  in the last 168 hours CBC:  Recent Labs Lab 10/26/13 0535 10/27/13 0447  WBC 16.4* 9.3  NEUTROABS 14.0* 5.6  HGB 11.7* 8.1*  HCT 35.2* 24.1*  MCV 85.2 86.7  PLT 262 175   Cardiac Enzymes: No results found for this basename: CKTOTAL, CKMB, CKMBINDEX, TROPONINI,  in the last 168 hours BNP (last 3 results) No  results found for this basename: PROBNP,  in the last 8760 hours CBG:  Recent Labs Lab 10/26/13 1712 10/26/13 1925 10/26/13 2348 10/27/13 0336 10/27/13 0759  GLUCAP 219* 230* 252* 241* 198*    No results found for this or any previous visit (from the past 240 hour(s)).   Studies: Dg Abd Acute W/chest  10/26/2013   CLINICAL DATA:  Left abdominal pain.  EXAM: ACUTE ABDOMEN SERIES (ABDOMEN 2 VIEW & CHEST 1 VIEW)  COMPARISON:  10/08/2013  FINDINGS: PICC line tip in the lower SVC. Lungs are clear bilaterally. Heart and mediastinum are within normal limits. The patient has a GJ feeding tube. The jejunal portion is now within the stomach and no longer the small bowel. Postsurgical bowel changes in the abdomen and post spinal surgery in the lower lumbar spine. There are a few air-fluid levels in the abdomen but these findings are nonspecific. Patient has a coronary artery stent.  IMPRESSION: Air-fluid levels in the abdomen are nonspecific. An early or partial bowel obstruction cannot be excluded.  The [place] feeding tube is no longer in the small bowel. The tip appears to be in the stomach.  No acute chest findings.   Electronically Signed   By: Markus Daft M.D.   On: 10/26/2013 08:31   Dg Abd Portable 1v  10/27/2013   CLINICAL DATA:  Abdominal pain, nausea and emesis. History of diverticulosis.  EXAM: PORTABLE ABDOMEN - 1 VIEW  COMPARISON:  DG ABD ACUTE W/CHEST dated 10/26/2013; DG ABD ACUTE W/CHEST dated 10/08/2013  FINDINGS: Bowel gas pattern is nondilated and nonobstructive. Gastrojejunostomy tube in left upper quadrant, the jejunal segment appears looped back into the stomach. Surgical clips in the right upper quadrant may reflect cholecystectomy. L4-5 hemilaminectomy with interbody disc prosthesis. Phleboliths in the pelvis. Limited assessment for free air on this supine view.  IMPRESSION: Nonspecific bowel gas pattern.  Gastrojejunostomy tip now projects in the stomach, as previously reported .    Electronically Signed   By: Elon Alas   On: 10/27/2013 05:54    Scheduled Meds: . enoxaparin (LOVENOX) injection  40 mg Subcutaneous Q24H  . famotidine (PEPCID) IV  20 mg Intravenous Q12H  . fentaNYL  25 mcg Transdermal Q72H  . insulin aspart  0-15 Units Subcutaneous 6 times per day  . insulin glargine  20 Units Subcutaneous QHS  . labetalol  20 mg Intravenous Q12H  . metoCLOPramide (REGLAN) injection  5 mg Intravenous 6 times per day  . mometasone-formoterol  2 puff Inhalation BID   Continuous Infusions: . sodium chloride 20 mL/hr at 10/26/13 1800  . Marland KitchenTPN (CLINIMIX-E) Adult 75 mL/hr at 10/26/13 1900   And  . fat emulsion 480 kcal (10/26/13 1900)  . Marland KitchenTPN (CLINIMIX-E) Adult     And  . fat emulsion        Time spent: 25 minutes    Jackey Housey  [facility] Pager [phone] . If 7PM-7AM, please contact night-coverage at [email or web address], password [facility] 10/27/2013, 11:10 AM  LOS: 1 day

## 2013-10-27 NOTE — Progress Notes (Signed)
IR aware of Ms. Devenney repeat admission for N/V. Patient is well known to our service for chronic problems with G-J tube.  See previous notes and procedures 'IR GJ tube change' under imaging section for most recent involvement and recs.  Imaging reviewed, Jejunal limb has retracted back into stomach again, but at least it has not refluxed into esophagus, as this has occurred in the past.  Recommend placing current tube to gravity drainage to assist with symptoms. Will d/w IR MD plan for attempt at repositioning jejunal limb.  Ascencion Dike PA-C Interventional Radiology 10/27/2013 11:33 AM

## 2013-10-27 NOTE — Progress Notes (Signed)
PARENTERAL NUTRITION CONSULT NOTE - Follow up  Pharmacy Consult for TNA Indication: intolerance to enteral feeding  Allergies  Allergen Reactions  . Other Hives and Shortness Of Breath    Bell Peppers   . Cefadroxil Hives    tolerated cefepime per [facility]  . Compazine [Prochlorperazine Edisylate] Hives    Tolerates promethazine  . Darvocet [Propoxyphene N-Acetaminophen] Hives  . Zofran [Ondansetron Hcl] Hives  . Nsaids Hives  . Penicillins Hives   Patient Measurements: Last weight: 70.5kg on 09/26/11 Usual Weight: 64.5kg per patient on 09/20/13  Vital Signs: Temp: 98 F (36.7 C) (03/29 0544) Temp src: Oral (03/29 0544) BP: 151/89 mmHg (03/29 0544) Pulse Rate: 93 (03/29 0544) Intake/Output from previous day: 03/28 0701 - 03/29 0700 In: 2128.5 [I.V.:1000.4; IV Piggyback:50; TPN:1078.1] Out: 800 [Urine:800] Intake/Output from this shift:   Labs:  Recent Labs  10/26/13 0535 10/27/13 0447  WBC 16.4* 9.3  HGB 11.7* 8.1*  HCT 35.2* 24.1*  PLT 262 175    Recent Labs  10/26/13 0535 10/26/13 1145 10/27/13 0447  NA 133* 133* 134*  K 4.2 3.9 3.8  CL 92* 95* 102  CO2 $Re'25 25 24  'ZVd$ GLUCOSE 487* 377* 239*  BUN 15 14 26*  CREATININE 0.60 0.54 0.90  CALCIUM 10.1 8.9 8.2*  MG  --  1.4* 1.5  PHOS  --  3.5 4.6  PROT 8.6* 7.5 5.7*  ALBUMIN 3.2* 2.7* 2.2*  AST 46* 32 24  ALT 62* 46* 30  ALKPHOS 172* 143* 99  BILITOT 0.3 0.2* <0.2*  PREALBUMIN  --  25.0  --   TRIG  --  187*  --     Recent Labs  10/26/13 2348 10/27/13 0336 10/27/13 0759  GLUCAP 252* 241* 198*   Medications:  Scheduled:  . enoxaparin (LOVENOX) injection  40 mg Subcutaneous Q24H  . famotidine (PEPCID) IV  20 mg Intravenous Q12H  . fentaNYL  25 mcg Transdermal Q72H  . insulin aspart  0-15 Units Subcutaneous 6 times per day  . insulin glargine  20 Units Subcutaneous QHS  . labetalol  20 mg Intravenous Q12H  . metoCLOPramide (REGLAN) injection  5 mg Intravenous 6 times per day  .  mometasone-formoterol  2 puff Inhalation BID   Infusions:  . sodium chloride 20 mL/hr at 10/26/13 1800  . Marland KitchenTPN (CLINIMIX-E) Adult 75 mL/hr at 10/26/13 1900   And  . fat emulsion 480 kcal (10/26/13 1900)   Insulin Requirements in the past 24 hours:  CBG 252/198; 23 units Novolog Mod SSI and Lantus 20 units qHS entered   Current Nutrition:  Diet: NPO IVF: NS @ 20/hr  TNA formula from [facility]: Dextrose: 230 gm/d, Protein: 92 gm/d, Lipids daily, Kcal: 1849/d Electrolytes: Na 157 mEq/d, Phos 24 mmol/d, K+ 80 mEq/d, Ca: 10 mEq/d, Cl: 211.8 mEq/d, Mag: 20 mEq/d Trace elements: Addemel N: 10 ml/d Given as 18 hour cyclic infusion: 1hr ramp up, 16hr infusion, 1 hr ramp down  Nutritional Goals:  RD recs pending-requested dietitian consult RD recs last admission: Kcal: 1600-1800/d, Protein: 80-95 gram/d, Fluid: 2100 ml//daily   Clinimix E 5/15 @ 75 ml/hr + IVF 20% at 10 ml/hr will provide: 90 g protein/day, 1758 Kcal/day, 1.8 L/day Clinimix electrolytes: Ca 4.5 mEq/L, Acetate 80 mEq/L, Na 35 mEq/L, Cl 39 mEq/L, Phosphate 15 mmol/L, K+ 30 mEq/L, Magnesium 5 mEq/L  Assessment: 50 yoF admitted 3/28 with recurrent abdominal pain. Patient has a G J tube for medications that requires repositioning by IR. Patient is on  home TNA for her malnutrition secondary to her severe retching and nausea vomiting which is a chronic issue for this patient. She has a Hx DM and sugars >400 on admission. Pharmacy has been consulted to dose TNA while inpatient. There is no TNA bag with the patient on admission. Will not start D10 infusion with hyperglycemia, will start TNA as continuous infusion and not as cyclic infusion as on at home  Labs:  Electrolytes: Na 134 (unable to adjust in TNA), other lytes WNL Renal Function: SCr 0.6- stable at patient's baseline Hepatic Function:  slight elevation in AST/ALT/Alk Phos, Tbili WNL Pre-Albumin: 25 (3/28) Triglycerides: 187 (3/28) CBGs: improved from admit 346   TNA access:  PICC placed 09/17/13 TNA day: continued from PTA  Plan:  At 1800 today:  Continue Clinimix E 5/15 @ 75 mL/hr (goal rate) 20% fat emulsion at 10 ml/hr.  TNA to contain standard multivitamins and trace elements daily to match home formula despite national shortage Continue CBG checks q4h and moderate SSI.  TNA lab panel on Mondays & Thursdays.  F/u daily.  Thank you for the consult.  Minda Ditto PharmD Pager [phone] 10/27/2013, 9:49 AM

## 2013-10-28 ENCOUNTER — Inpatient Hospital Stay (HOSPITAL_COMMUNITY): Payer: Medicare Other

## 2013-10-28 DIAGNOSIS — G8929 Other chronic pain: Secondary | ICD-10-CM

## 2013-10-28 LAB — TRIGLYCERIDES: Triglycerides: 175 mg/dL — ABNORMAL HIGH (ref <150)

## 2013-10-28 LAB — CBC
HCT: 24 % — ABNORMAL LOW (ref 36.0–46.0)
Hemoglobin: 7.8 g/dL — ABNORMAL LOW (ref 12.0–15.0)
MCH: 28.7 pg (ref 26.0–34.0)
MCHC: 32.5 g/dL (ref 30.0–36.0)
MCV: 88.2 fL (ref 78.0–100.0)
PLATELETS: 160 10*3/uL (ref 150–400)
RBC: 2.72 MIL/uL — ABNORMAL LOW (ref 3.87–5.11)
RDW: 14.9 % (ref 11.5–15.5)
WBC: 6.2 10*3/uL (ref 4.0–10.5)

## 2013-10-28 LAB — DIFFERENTIAL
BASOS ABS: 0 10*3/uL (ref 0.0–0.1)
Basophils Relative: 0 % (ref 0–1)
EOS PCT: 2 % (ref 0–5)
Eosinophils Absolute: 0.1 10*3/uL (ref 0.0–0.7)
LYMPHS PCT: 42 % (ref 12–46)
Lymphs Abs: 2.6 10*3/uL (ref 0.7–4.0)
Monocytes Absolute: 0.5 10*3/uL (ref 0.1–1.0)
Monocytes Relative: 7 % (ref 3–12)
NEUTROS ABS: 3 10*3/uL (ref 1.7–7.7)
NEUTROS PCT: 49 % (ref 43–77)

## 2013-10-28 LAB — GLUCOSE, CAPILLARY
GLUCOSE-CAPILLARY: 168 mg/dL — AB (ref 70–99)
GLUCOSE-CAPILLARY: 179 mg/dL — AB (ref 70–99)
GLUCOSE-CAPILLARY: 175 mg/dL — AB (ref 70–99)
GLUCOSE-CAPILLARY: 190 mg/dL — AB (ref 70–99)
Glucose-Capillary: 176 mg/dL — ABNORMAL HIGH (ref 70–99)
Glucose-Capillary: 248 mg/dL — ABNORMAL HIGH (ref 70–99)

## 2013-10-28 LAB — PREALBUMIN: PREALBUMIN: 22.1 mg/dL (ref 17.0–34.0)

## 2013-10-28 LAB — COMPREHENSIVE METABOLIC PANEL
ALT: 26 U/L (ref 0–35)
AST: 25 U/L (ref 0–37)
Albumin: 2.3 g/dL — ABNORMAL LOW (ref 3.5–5.2)
Alkaline Phosphatase: 93 U/L (ref 39–117)
BUN: 15 mg/dL (ref 6–23)
CALCIUM: 8.5 mg/dL (ref 8.4–10.5)
CO2: 24 mEq/L (ref 19–32)
Chloride: 102 mEq/L (ref 96–112)
Creatinine, Ser: 0.58 mg/dL (ref 0.50–1.10)
GFR calc non Af Amer: >90 mL/min (ref >90)
GLUCOSE: 183 mg/dL — AB (ref 70–99)
Potassium: 3.7 mEq/L (ref 3.7–5.3)
Sodium: 135 mEq/L — ABNORMAL LOW (ref 137–147)
TOTAL PROTEIN: 5.8 g/dL — AB (ref 6.0–8.3)
Total Bilirubin: <0.2 mg/dL — ABNORMAL LOW (ref 0.3–1.2)

## 2013-10-28 LAB — MAGNESIUM: Magnesium: 1.6 mg/dL (ref 1.5–2.5)

## 2013-10-28 LAB — PHOSPHORUS: PHOSPHORUS: 3.9 mg/dL (ref 2.3–4.6)

## 2013-10-28 MED ORDER — DIPHENHYDRAMINE HCL 12.5 MG/5ML PO ELIX
12.5000 mg | ORAL_SOLUTION | Freq: Four times a day (QID) | ORAL | Status: DC | PRN
  Filled 2013-10-28: qty 5

## 2013-10-28 MED ORDER — TRACE MINERALS CR-CU-F-FE-I-MN-MO-SE-ZN IV SOLN
INTRAVENOUS | Status: AC
  Administered 2013-10-28: 18:00:00 via INTRAVENOUS
  Filled 2013-10-28: qty 2000

## 2013-10-28 MED ORDER — FAT EMULSION 20 % IV EMUL
240.0000 mL | INTRAVENOUS | Status: AC
  Administered 2013-10-28: 240 mL via INTRAVENOUS
  Filled 2013-10-28: qty 250

## 2013-10-28 MED ORDER — HYDROMORPHONE HCL PF 1 MG/ML IJ SOLN
1.0000 mg | Freq: Once | INTRAMUSCULAR | Status: AC
  Administered 2013-10-28: 1 mg via INTRAVENOUS
  Filled 2013-10-28: qty 1

## 2013-10-28 MED ORDER — HYDROMORPHONE HCL PF 1 MG/ML IJ SOLN
1.0000 mg | Freq: Four times a day (QID) | INTRAMUSCULAR | Status: DC | PRN
  Administered 2013-10-28 – 2013-10-30 (×7): 1 mg via INTRAVENOUS
  Filled 2013-10-28 (×7): qty 1

## 2013-10-28 MED ORDER — DIPHENHYDRAMINE HCL 50 MG/ML IJ SOLN
12.5000 mg | Freq: Four times a day (QID) | INTRAMUSCULAR | Status: DC | PRN
  Administered 2013-10-28 – 2013-10-30 (×6): 12.5 mg via INTRAVENOUS
  Filled 2013-10-28 (×6): qty 1

## 2013-10-28 MED ORDER — IOHEXOL 300 MG/ML  SOLN
10.0000 mL | Freq: Once | INTRAMUSCULAR | Status: AC | PRN
  Administered 2013-10-28: 10 mL

## 2013-10-28 MED ORDER — DIPHENHYDRAMINE HCL 50 MG/ML IJ SOLN
12.5000 mg | Freq: Once | INTRAMUSCULAR | Status: AC
  Administered 2013-10-28: 12.5 mg via INTRAVENOUS
  Filled 2013-10-28: qty 1

## 2013-10-28 NOTE — Plan of Care (Signed)
Problem: Phase I Progression Outcomes Goal: OOB as tolerated unless otherwise ordered Outcome: Completed/Met Date Met:  10/28/13 Pt OOB frequently around room.

## 2013-10-28 NOTE — Progress Notes (Signed)
Pt back from IR. Pt nauseated but states "nothing will come up." Pt dry heaving. Phenergan and Reglan administered. Approximately 15 min later, pt calm, no longer nauseated.

## 2013-10-28 NOTE — Care Management Note (Signed)
    Page 1 of 1   10/28/2013     3:34:43 PM   CARE MANAGEMENT NOTE 10/28/2013  Patient:  [name], [name]   Account Number:  [id number]  Date Initiated:  10/28/2013  Documentation initiated by:  [facility]  Subjective/Objective Assessment:   43 Y/O F ADMITTED W/ABD PAIN,N/V.KB:[id number] GASTROPARESIS, G J TUBE.     Action/Plan:   FROM HOME.ACTIVE [place] HHRN,TPN.HAS PCP,PHARMACY.   Anticipated DC Date:  10/29/2013   Anticipated DC Plan:  [place]  CM consult      [facility] Choice  Resumption Of Svcs/PTA Provider   Choice offered to / List presented to:  C-1 Patient           Status of service:  In process, will continue to follow Medicare Important Message given?   (If response is "NO", the following Medicare IM given date fields will be blank) Date Medicare IM given:   Date Additional Medicare IM given:    Discharge Disposition:    Per UR Regulation:  Reviewed for med. necessity/level of care/duration of stay  If discussed at Long Length of Stay Meetings, dates discussed:    Comments:  10/28/13 Kaitrin Seybold RN,BSN NCM [phone] IR-G J TUBE REPOSITIONING.PAIN CONTROL ISSUES.[facility] KRISTEN AWARE & FOLLOWING FOR RESUMPTION OF HHRN,& TPN.AWAIT FINAL HHC ORDER.

## 2013-10-28 NOTE — Progress Notes (Signed)
PARENTERAL NUTRITION CONSULT NOTE - Follow up  Pharmacy Consult for TNA Indication: intolerance to enteral feeding  Allergies  Allergen Reactions  . Other Hives and Shortness Of Breath    Bell Peppers   . Cefadroxil Hives    tolerated cefepime per [facility]  . Compazine [Prochlorperazine Edisylate] Hives    Tolerates promethazine  . Darvocet [Propoxyphene N-Acetaminophen] Hives  . Zofran [Ondansetron Hcl] Hives  . Nsaids Hives  . Penicillins Hives   Patient Measurements: Last weight: 74.8kg on 10/28/13 Usual Weight: 64.5kg per patient on 09/20/13  Vital Signs: Temp: 98.1 F (36.7 C) (03/30 0555) Temp src: Oral (03/30 0555) BP: 143/86 mmHg (03/30 0555) Pulse Rate: 78 (03/30 0555) Intake/Output from previous day: 03/29 0701 - 03/30 0700 In: 1358.6 [I.V.:240; IV Piggyback:100; TPN:1018.6] Out: 3600 [Urine:3600] Intake/Output from this shift:   Labs:  Recent Labs  10/26/13 0535 10/27/13 0447 10/28/13 0443  WBC 16.4* 9.3 6.2  HGB 11.7* 8.1* 7.8*  HCT 35.2* 24.1* 24.0*  PLT 262 175 160    Recent Labs  10/26/13 1145 10/27/13 0447 10/28/13 0443  NA 133* 134* 135*  K 3.9 3.8 3.7  CL 95* 102 102  CO2 25 24 24   GLUCOSE 377* 239* 183*  BUN 14 26* 15  CREATININE 0.54 0.90 0.58  CALCIUM 8.9 8.2* 8.5  MG 1.4* 1.5 1.6  PHOS 3.5 4.6 3.9  PROT 7.5 5.7* 5.8*  ALBUMIN 2.7* 2.2* 2.3*  AST 32 24 25  ALT 46* 30 26  ALKPHOS 143* 99 93  BILITOT 0.2* <0.2* <0.2*  PREALBUMIN 25.0  --   --   TRIG 187*  --  175*  Corrected calcium = 9.9  Recent Labs  10/27/13 2336 10/28/13 0330 10/28/13 0758  GLUCAP 205* 176* 168*   Medications:  Scheduled:  . enoxaparin (LOVENOX) injection  40 mg Subcutaneous Q24H  . famotidine (PEPCID) IV  20 mg Intravenous Q12H  . fentaNYL  25 mcg Transdermal Q72H  . insulin aspart  0-15 Units Subcutaneous 6 times per day  . insulin glargine  20 Units Subcutaneous QHS  . labetalol  20 mg Intravenous Q12H  . metoCLOPramide (REGLAN)  injection  5 mg Intravenous 6 times per day  . mometasone-formoterol  2 puff Inhalation BID   Infusions:  . sodium chloride 20 mL/hr at 10/26/13 1800  . sodium chloride 100 mL/hr at 10/27/13 1941  . Marland KitchenTPN (CLINIMIX-E) Adult 75 mL/hr at 10/27/13 1739   And  . fat emulsion 240 mL (10/27/13 1740)   CBGs and Insulin Requirements in the past 24 hours:  CBGs: 256, 222, 267, 205, 176, 168.  27 units of Novolog used (on SSI moderate-scale q4h) Lantus increased to 20 units qHS by MD on 3/28.   Current Nutrition:  Diet: NPO Clinimix-E 5/20 at 75 mL/hr + Fat Emulsion 20% at 10 mL/hr IVF: NS at 20/hr  TNA formula from [facility]: Dextrose: 230 gm/d, Protein: 92 gm/d, Lipids daily, Kcal: 1849/d Electrolytes: Na 157 mEq/d, Phos 24 mmol/d, K+ 80 mEq/d, Ca: 10 mEq/d, Cl: 211.8 mEq/d, Mag: 20 mEq/d Trace elements: Addemel N: 10 ml/d Given as 18 hour cyclic infusion: 1hr ramp up, 16hr infusion, 1 hr ramp down  Nutritional Goals:  RD recommendations from 3/29: Kcal: 1600-1800/d, Protein: 80-95 grams/d, Fluid: 2100 ml//daily   Clinimix E 5/15 at 75 ml/hr + IV Fat Emulsion 20% at 10 ml/hr will provide: 90 g protein/day, 1758 Kcal/day, 1.8 L/day Clinimix electrolytes: Ca 4.5 mEq/L, Acetate 80 mEq/L, Na 35 mEq/L, Cl  39 mEq/L, Phosphate 15 mmol/L, K+ 30 mEq/L, Magnesium 5 mEq/L  Assessment: 61 yoF admitted 3/28 with recurrent abdominal pain. Patient has a G J tube for medications that requires repositioning by IR. Patient was on home TNA for her malnutrition secondary to her severe retching and nausea vomiting which is a chronic issue for this patient. She has a Hx DM and sugars were >400 on admission. Pharmacy was consulted to dose TNA while inpatient. Given marked hyperglycemia, TNA was restarted as continuous rather a cyclic infusion.  Labs:  Electrolytes: Na slightly low (unable to adjust in TNA), other lytes WNL Renal Function: SCr, BUN - WNL, stable Hepatic Function:  LFTs improved, all now below  ULN. Pre-Albumin: 25 (3/28), today's value pending Triglycerides: 187 (3/28), 175 (3/30) - slightly elevated but improving CBGs: steadily improving on increased Lantus + sliding scale insulin  TNA access: PICC placed 09/17/13 TNA day: continued from PTA  Plan:  Continue Clinimix E 5/15 @ 75 mL/hr (goal rate) Continue 20% fat emulsion at 10 ml/hr.  TNA to contain standard multivitamins and trace elements daily to match home formula despite national shortage Continue CBG checks q4h and moderate SSI.  Continue Lantus at 20 units qhs per MD. Watch K on increased insulin dosage - BMet tomorrow. TNA lab panel on Mondays & Thursdays.  F/u daily.  Clayburn Pert, PharmD, BCPS Pager: [phone] 10/28/2013  8:41 AM

## 2013-10-28 NOTE — Procedures (Signed)
Successful bedside removal of mal positioned co-axial jejunal tube. Appropriately positioned and functioning balloon retention G-tube.   No exchange performed.

## 2013-10-28 NOTE — Progress Notes (Signed)
[facility]  Patient Status: Active (receiving services up to time of hospitalization)  [facility] is providing the following services: RN and TPN  If patient discharges after hours, please call [phone].   [name] 10/28/2013, 11:04 AM

## 2013-10-28 NOTE — Progress Notes (Signed)
[facility] PROGRESS NOTE  [name] [id number] DOB: Dec 26, 1970 DOA: 10/26/2013 PCP: [name], MD   Brief narrative 43 year old female with history of uncontrolled diabetes mellitus with diabetic gastroparesis status post gastrostomy tube placement, chronic abdominal pain, 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 Patient dehydrated with elevated blood glucose in ED.   Assessment/Plan:  SIRS with Diabetic gastroparesis  Improving.continue TPN Has G J tube which she uses for meds only. switched meds to IV for now.  IV hydration with NS  Scheduled reglan 10 mg once followed by 4 mg q 4-6 hrs. Pain control with prn dilaudid. contiue fentanyl patch.  abdominal pain improving .   blood cx and  UA unremarkable.  Elevated  lactic acid level resolved .   Active Problems:  Mispositioned GJ tube  x ray reports GJ tube tip to be in the stomach. Seen by IR and performed bedside removal of mal positioned co-axial jejunal tube and positioned and functioning balloon retention G-tube.  She needs to follow up with Dr Derrill Kay at [facility].  will resume meds through tube in am   Intractable nausea and vomiting  History of severe diabetic gastroparesis. Continue scheduled Reglan   GERD (gastroesophageal reflux disease)   continue IV pepcid  BID   Hypertension  - on IV labetalol 20 mg twice a day and when necessary IV hydralazine 10 mg every 6 hours.   DM (diabetes mellitus), , uncontrolled with hypoglycemia  Elevated blood glucose on presentation with midl AG. Now improving.  on sliding scale insulin. Lantus dose increased to 20 units at bedtime. Last A1c of 8.6.   Dehydration  Cont IV NS  Protein-calorie malnutrition, severe  Continue TNA   Chronic abdominal pain  Pain control with prn dilaudid    Diet:NPO/ TPN   DVT prophylaxis: sq lovenox  Code Status: FULL Family Communication: none at bedside Disposition Plan: home possibly in am   Consultants:  IR  Procedures:  none  Antibiotics:  none  HPI/Subjective: Patient seen and examined. Nausea and abdominal pain improved. J portion of there tube came of last night   Objective: Filed Vitals:   10/28/13 1400  BP: 149/84  Pulse: 83  Temp: 98.9 F (37.2 C)  Resp: 18    Intake/Output Summary (Last 24 hours) at 10/28/13 1725 Last data filed at 10/28/13 1200  Gross per 24 hour  Intake 993.58 ml  Output   3300 ml  Net -2306.42 ml   Filed Weights   10/28/13 0147  Weight: 74.844 kg (165 lb)    Exam: Gen: NAD HEENT: no pallor, no icterus, dry oral mucosa,  Cardiovascular: S1 normal, S2 tachycardic no MRG  Chest: CTAB, no wheezes, rales, or rhonchi  Abdominal: Soft. non-distended, bowel sounds are normal,  Minimal  tenderness to palpation, has GJ tube, no masses, organomegaly, or guarding present.  GU: no CVA tenderness Ext: warm, no edema Neurological: A&O x3, non focal   Data Reviewed: Basic Metabolic Panel:  Recent Labs Lab 10/26/13 0535 10/26/13 1145 10/27/13 0447 10/28/13 0443  NA 133* 133* 134* 135*  K 4.2 3.9 3.8 3.7  CL 92* 95* 102 102  CO2 25 25 24 24   GLUCOSE 487* 377* 239* 183*  BUN 15 14 26* 15  CREATININE 0.60 0.54 0.90 0.58  CALCIUM 10.1 8.9 8.2* 8.5  MG  --  1.4*  1.5 1.6  PHOS  --  3.5 4.6 3.9   Liver Function Tests:  Recent Labs Lab 10/26/13 0535 10/26/13 1145 10/27/13 0447 10/28/13 0443  AST 46* 32 24 25  ALT 62* 46* 30 26  ALKPHOS 172* 143* 99 93  BILITOT 0.3 0.2* <0.2* <0.2*  PROT 8.6* 7.5 5.7* 5.8*  ALBUMIN 3.2* 2.7* 2.2* 2.3*    Recent Labs Lab 10/26/13 0535  LIPASE 33   No results found for this basename: AMMONIA,  in the last 168 hours CBC:  Recent Labs Lab 10/26/13 0535 10/27/13 0447 10/28/13 0443  WBC 16.4* 9.3 6.2  NEUTROABS 14.0* 5.6 3.0   HGB 11.7* 8.1* 7.8*  HCT 35.2* 24.1* 24.0*  MCV 85.2 86.7 88.2  PLT 262 175 160   Cardiac Enzymes: No results found for this basename: CKTOTAL, CKMB, CKMBINDEX, TROPONINI,  in the last 168 hours BNP (last 3 results) No results found for this basename: PROBNP,  in the last 8760 hours CBG:  Recent Labs Lab 10/27/13 2336 10/28/13 0330 10/28/13 0758 10/28/13 1147 10/28/13 1606  GLUCAP 205* 176* 168* 179* 175*    Recent Results (from the past 240 hour(s))  CULTURE, BLOOD (ROUTINE X 2)     Status: None   Collection Time    10/26/13 11:19 AM      Result Value Ref Range Status   Specimen Description BLOOD LEFT HAND   Final   Special Requests BOTTLES DRAWN AEROBIC AND ANAEROBIC 2.5CC   Final   Culture  Setup Time     Final   Value: 10/26/2013 18:45     Performed at [facility]   Culture     Final   Value:        BLOOD CULTURE RECEIVED NO GROWTH TO DATE CULTURE WILL BE HELD FOR 5 DAYS BEFORE ISSUING A FINAL NEGATIVE REPORT     Performed at [facility]   Report Status PENDING   Incomplete  CULTURE, BLOOD (ROUTINE X 2)     Status: None   Collection Time    10/26/13 11:40 AM      Result Value Ref Range Status   Specimen Description BLOOD LEFT HAND   Final   Special Requests BOTTLES DRAWN AEROBIC AND ANAEROBIC 2CC   Final   Culture  Setup Time     Final   Value: 10/26/2013 18:45     Performed at [facility]   Culture     Final   Value:        BLOOD CULTURE RECEIVED NO GROWTH TO DATE CULTURE WILL BE HELD FOR 5 DAYS BEFORE ISSUING A FINAL NEGATIVE REPORT     Performed at [facility]   Report Status PENDING   Incomplete     Studies: Ir Cm Inj Any Colonic Tube W/fluoro  10/28/2013   INDICATION: 43 year old female well known to the interventional radiology department with past medical history significant for gastroparesis with chronic nausea and vomiting. She has had a percutaneous gastrojejunostomy tube which she frequently regurgitates from its  normal position into either the gastric lumen or esophagus requiring manipulation under fluoroscopic guidance which happens on a nearly a monthly basis.  As such, it is has been deemed she is no longer a candidate for gastrojejunostomy tube replacement, rather, the gastrostomy tube will be left in place for venting purposes. Please remove the coaxial jejunostomy tube and ensure appropriate positioning of the existing gastrostomy.  EXAM: FLUOROSCOPIC GUIDED EVALUATE OF GASTROSTOMY TUBE  COMPARISON:  DG  ABD PORTABLE 1V dated 10/27/2013; SP REPLC GASTRO/JEJUNO TUBE PERCUT W/FLUORO dated 09/18/2013; IR GASTRIC TUBE PERC CHG W/O IMG GUIDE dated 09/13/2013; CT ABD/PELVIS W CM dated 08/15/2013  MEDICATIONS: None.  CONTRAST:  82mL OMNIPAQUE IOHEXOL 300 MG/ML  SOLN  FLUOROSCOPY TIME:  1 minute.  COMPLICATIONS: None immediate  PROCEDURE: Informed written consent was obtained from the patient after a discussion of the risks, benefits and alternatives to treatment. Questions regarding the procedure were encouraged and answered. A timeout was performed prior to the initiation of the procedure.  The coaxial jejunostomy tube was removed at the patient's bedside.  The existing gastrostomy tube was injected with a small amount of contrast and several spot fluoroscopic images were obtained in various obliquities to confirm appropriate intraluminal positioning. A dressing was placed. The patient tolerated the procedure well without immediate postprocedural complication.  IMPRESSION: 1. Successful bedside removal of the existing coaxial jejunostomy tube. As above, given frequent malpositioning of the jejunostomy lumen, this patient is no longer a candidate for percutaneous gastrojejunostomy tube placement. 2. Appropriately positioned and functioning existing 20 French balloon retention gastrostomy tube. No exchange performed. The gastrostomy tube is ready for immediate use.   Electronically Signed   By: Sandi Mariscal M.D.   On: 10/28/2013  15:31   Dg Abd Portable 1v  10/27/2013   CLINICAL DATA:  Abdominal pain, nausea and emesis. History of diverticulosis.  EXAM: PORTABLE ABDOMEN - 1 VIEW  COMPARISON:  DG ABD ACUTE W/CHEST dated 10/26/2013; DG ABD ACUTE W/CHEST dated 10/08/2013  FINDINGS: Bowel gas pattern is nondilated and nonobstructive. Gastrojejunostomy tube in left upper quadrant, the jejunal segment appears looped back into the stomach. Surgical clips in the right upper quadrant may reflect cholecystectomy. L4-5 hemilaminectomy with interbody disc prosthesis. Phleboliths in the pelvis. Limited assessment for free air on this supine view.  IMPRESSION: Nonspecific bowel gas pattern.  Gastrojejunostomy tip now projects in the stomach, as previously reported .   Electronically Signed   By: Elon Alas   On: 10/27/2013 05:54    Scheduled Meds: . enoxaparin (LOVENOX) injection  40 mg Subcutaneous Q24H  . famotidine (PEPCID) IV  20 mg Intravenous Q12H  . fentaNYL  25 mcg Transdermal Q72H  . insulin aspart  0-15 Units Subcutaneous 6 times per day  . insulin glargine  20 Units Subcutaneous QHS  . labetalol  20 mg Intravenous Q12H  . metoCLOPramide (REGLAN) injection  5 mg Intravenous 6 times per day  . mometasone-formoterol  2 puff Inhalation BID   Continuous Infusions: . sodium chloride 20 mL/hr at 10/26/13 1800  . sodium chloride 20 mL/hr at 10/28/13 1200  . Marland KitchenTPN (CLINIMIX-E) Adult 75 mL/hr at 10/28/13 1200   And  . fat emulsion 240 mL (10/28/13 1200)  . Marland KitchenTPN (CLINIMIX-E) Adult     And  . fat emulsion        Time spent: 25 minutes    Keoshia Steinmetz  [facility] Pager [phone] . If 7PM-7AM, please contact night-coverage at [email or web address], password [facility] 10/28/2013, 5:25 PM  LOS: 2 days

## 2013-10-28 NOTE — Plan of Care (Signed)
Problem: Phase III Progression Outcomes Goal: Pain controlled on oral analgesia Outcome: Not Progressing Pt continues to c/o pain 6/10 on IV pain medications. Pt NPO and J tube not in use.

## 2013-10-29 DIAGNOSIS — R112 Nausea with vomiting, unspecified: Secondary | ICD-10-CM

## 2013-10-29 LAB — GLUCOSE, CAPILLARY
GLUCOSE-CAPILLARY: 198 mg/dL — AB (ref 70–99)
GLUCOSE-CAPILLARY: 179 mg/dL — AB (ref 70–99)
GLUCOSE-CAPILLARY: 198 mg/dL — AB (ref 70–99)
GLUCOSE-CAPILLARY: 212 mg/dL — AB (ref 70–99)
Glucose-Capillary: 261 mg/dL — ABNORMAL HIGH (ref 70–99)
Glucose-Capillary: 290 mg/dL — ABNORMAL HIGH (ref 70–99)

## 2013-10-29 LAB — BASIC METABOLIC PANEL
BUN: 14 mg/dL (ref 6–23)
CHLORIDE: 103 meq/L (ref 96–112)
CO2: 24 mEq/L (ref 19–32)
Calcium: 8.7 mg/dL (ref 8.4–10.5)
Creatinine, Ser: 0.51 mg/dL (ref 0.50–1.10)
GFR calc Af Amer: >90 mL/min (ref >90)
Glucose, Bld: 205 mg/dL — ABNORMAL HIGH (ref 70–99)
POTASSIUM: 3.8 meq/L (ref 3.7–5.3)
SODIUM: 136 meq/L — AB (ref 137–147)

## 2013-10-29 MED ORDER — TRACE MINERALS CR-CU-F-FE-I-MN-MO-SE-ZN IV SOLN
INTRAVENOUS | Status: DC
  Administered 2013-10-29: 18:00:00 via INTRAVENOUS
  Filled 2013-10-29: qty 2000

## 2013-10-29 MED ORDER — FAT EMULSION 20 % IV EMUL
240.0000 mL | INTRAVENOUS | Status: DC
  Administered 2013-10-29: 240 mL via INTRAVENOUS
  Filled 2013-10-29: qty 250

## 2013-10-29 MED ORDER — GABAPENTIN 250 MG/5ML PO SOLN
600.0000 mg | Freq: Three times a day (TID) | ORAL | Status: DC
  Administered 2013-10-29 – 2013-10-30 (×3): 600 mg
  Filled 2013-10-29 (×7): qty 12

## 2013-10-29 MED ORDER — LABETALOL 40 MG/ML PEDIATRIC ORAL SUSPENSION: 200.0000 mg | Freq: Two times a day (BID) | ORAL | Status: DC

## 2013-10-29 MED ORDER — LABETALOL HCL 200 MG PO TABS
200.0000 mg | ORAL_TABLET | Freq: Two times a day (BID) | ORAL | Status: DC
  Administered 2013-10-29 – 2013-10-30 (×2): 200 mg
  Filled 2013-10-29 (×3): qty 1

## 2013-10-29 MED ORDER — DICYCLOMINE HCL 10 MG/5ML PO SOLN
10.0000 mg | Freq: Three times a day (TID) | ORAL | Status: DC
  Administered 2013-10-29 – 2013-10-30 (×4): 10 mg
  Filled 2013-10-29 (×7): qty 5

## 2013-10-29 MED ORDER — VENLAFAXINE HCL 75 MG PO TABS
75.0000 mg | ORAL_TABLET | Freq: Every day | ORAL | Status: DC
  Administered 2013-10-29 – 2013-10-30 (×2): 75 mg via JEJUNOSTOMY
  Filled 2013-10-29 (×2): qty 1

## 2013-10-29 NOTE — Progress Notes (Addendum)
[facility] PROGRESS NOTE  AKSHITHA HUPPE [id number] DOB: [birth year] DOA: 10/26/2013 PCP: [name], MD   Brief narrative 43 year old female with history of uncontrolled diabetes mellitus with diabetic gastroparesis status post gastrostomy tube placement, chronic abdominal pain, 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 Patient dehydrated with elevated blood glucose in ED.   Assessment/Plan:  SIRS with Diabetic gastroparesis  Improving.continue TPN Has G J tube which she uses for meds only. switched meds to IV IV hydration with NS  Placed on scheduled Reglan.  Pain control with prn dilaudid. contiue fentanyl patch.  abdominal pain improving but has severe nausea since this morning.  .   blood cx and  UA unremarkable.  Elevated  lactic acid level resolved .   Active Problems:  Mispositioned GJ tube  x ray reports GJ tube tip to be in the stomach. Seen by IR and performed bedside removal of mal positioned co-axial jejunal tube and positioned and functioning balloon retention G-tube.  She needs to follow up with Dr Derrill Kay at [facility] having an appt on 04/23  will resume meds through tube. She is instructed to open the G tube for relief of gastric content if she has nausea and vomiting as outpatient.   Intractable nausea and vomiting  History of severe diabetic gastroparesis. Continue scheduled Reglan   GERD (gastroesophageal reflux disease)   continue IV pepcid  BID   Hypertension  - placed on IV labetalol as tube not being used. Resume labetalol per G tube. when necessary IV hydralazine 10 mg every 6 hours.   DM (diabetes mellitus), , uncontrolled with hypoglycemia  Elevated blood glucose on presentation with midl AG. Now improving.  on sliding scale insulin. Lantus dose increased  to 20 units at bedtime. Last A1c of 8.6.   Dehydration  Cont IV NS  Protein-calorie malnutrition, severe  Continue TNA   Chronic abdominal pain  Pain control with prn dilaudid   Anemia Chronic and stable at present. No need for trasnfusion  Diet:NPO/ TPN   DVT prophylaxis: sq lovenox  Code Status: FULL Family Communication: none at bedside Disposition Plan: hold discharge for today given persistent nausea . Has  tolerated meds through G tube.  Monitor improvement of symptoms in am .   Consultants:  IR  Procedures:  none  Antibiotics:  none  HPI/Subjective: Patient seen and examined. abdominal pain improved but hs severe nausea and dry heaveing  Objective: Filed Vitals:   10/29/13 0500  BP: 155/86  Pulse: 87  Temp: 99 F (37.2 C)  Resp: 20    Intake/Output Summary (Last 24 hours) at 10/29/13 1429 Last data filed at 10/29/13 1000  Gross per 24 hour  Intake   2530 ml  Output   2500 ml  Net     30 ml   Filed Weights   10/28/13 0147  Weight: 74.844 kg (165 lb)    Exam: Gen: in some distress with nausea HEENT: no pallor, no icterus, dry oral mucosa,  Cardiovascular: S1 normal, S2 tachycardic no MRG  Chest: CTAB, no wheezes, rales, or rhonchi  Abdominal: Soft. non-distended, bowel sounds are normal,  Minimal  tenderness to palpation, has G tube, no masses, organomegaly, or guarding present.  GU: no CVA tenderness Ext: warm, no edema Neurological: A&O x3, non focal   Data Reviewed: Basic Metabolic Panel:  Recent Labs  Lab 10/26/13 0535 10/26/13 1145 10/27/13 0447 10/28/13 0443 10/29/13 0500  NA 133* 133* 134* 135* 136*  K 4.2 3.9 3.8 3.7 3.8  CL 92* 95* 102 102 103  CO2 25 25 24 24 24   GLUCOSE 487* 377* 239* 183* 205*  BUN 15 14 26* 15 14  CREATININE 0.60 0.54 0.90 0.58 0.51  CALCIUM 10.1 8.9 8.2* 8.5 8.7  MG  --  1.4* 1.5 1.6  --   PHOS  --  3.5 4.6 3.9  --    Liver Function Tests:  Recent Labs Lab 10/26/13 0535 10/26/13 1145  10/27/13 0447 10/28/13 0443  AST 46* 32 24 25  ALT 62* 46* 30 26  ALKPHOS 172* 143* 99 93  BILITOT 0.3 0.2* <0.2* <0.2*  PROT 8.6* 7.5 5.7* 5.8*  ALBUMIN 3.2* 2.7* 2.2* 2.3*    Recent Labs Lab 10/26/13 0535  LIPASE 33   No results found for this basename: AMMONIA,  in the last 168 hours CBC:  Recent Labs Lab 10/26/13 0535 10/27/13 0447 10/28/13 0443  WBC 16.4* 9.3 6.2  NEUTROABS 14.0* 5.6 3.0  HGB 11.7* 8.1* 7.8*  HCT 35.2* 24.1* 24.0*  MCV 85.2 86.7 88.2  PLT 262 175 160   Cardiac Enzymes: No results found for this basename: CKTOTAL, CKMB, CKMBINDEX, TROPONINI,  in the last 168 hours BNP (last 3 results) No results found for this basename: PROBNP,  in the last 8760 hours CBG:  Recent Labs Lab 10/28/13 1933 10/28/13 2327 10/29/13 0356 10/29/13 0807 10/29/13 1220  GLUCAP 190* 248* 198* 179* 198*    Recent Results (from the past 240 hour(s))  CULTURE, BLOOD (ROUTINE X 2)     Status: None   Collection Time    10/26/13 11:19 AM      Result Value Ref Range Status   Specimen Description BLOOD LEFT HAND   Final   Special Requests BOTTLES DRAWN AEROBIC AND ANAEROBIC 2.5CC   Final   Culture  Setup Time     Final   Value: 10/26/2013 18:45     Performed at [facility]   Culture     Final   Value:        BLOOD CULTURE RECEIVED NO GROWTH TO DATE CULTURE WILL BE HELD FOR 5 DAYS BEFORE ISSUING A FINAL NEGATIVE REPORT     Performed at [facility]   Report Status PENDING   Incomplete  CULTURE, BLOOD (ROUTINE X 2)     Status: None   Collection Time    10/26/13 11:40 AM      Result Value Ref Range Status   Specimen Description BLOOD LEFT HAND   Final   Special Requests BOTTLES DRAWN AEROBIC AND ANAEROBIC 2CC   Final   Culture  Setup Time     Final   Value: 10/26/2013 18:45     Performed at [facility]   Culture     Final   Value:        BLOOD CULTURE RECEIVED NO GROWTH TO DATE CULTURE WILL BE HELD FOR 5 DAYS BEFORE ISSUING A FINAL  NEGATIVE REPORT     Performed at [facility]   Report Status PENDING   Incomplete     Studies: Ir Cm Inj Any Colonic Tube W/fluoro  10/28/2013   INDICATION: 43 year old female well known to the interventional radiology department with past medical history significant for gastroparesis with chronic nausea and vomiting. She has had a percutaneous gastrojejunostomy tube which she frequently regurgitates from  its normal position into either the gastric lumen or esophagus requiring manipulation under fluoroscopic guidance which happens on a nearly a monthly basis.  As such, it is has been deemed she is no longer a candidate for gastrojejunostomy tube replacement, rather, the gastrostomy tube will be left in place for venting purposes. Please remove the coaxial jejunostomy tube and ensure appropriate positioning of the existing gastrostomy.  EXAM: FLUOROSCOPIC GUIDED EVALUATE OF GASTROSTOMY TUBE  COMPARISON:  DG ABD PORTABLE 1V dated 10/27/2013; SP REPLC GASTRO/JEJUNO TUBE PERCUT W/FLUORO dated 09/18/2013; IR GASTRIC TUBE PERC CHG W/O IMG GUIDE dated 09/13/2013; CT ABD/PELVIS W CM dated 08/15/2013  MEDICATIONS: None.  CONTRAST:  60mL OMNIPAQUE IOHEXOL 300 MG/ML  SOLN  FLUOROSCOPY TIME:  1 minute.  COMPLICATIONS: None immediate  PROCEDURE: Informed written consent was obtained from the patient after a discussion of the risks, benefits and alternatives to treatment. Questions regarding the procedure were encouraged and answered. A timeout was performed prior to the initiation of the procedure.  The coaxial jejunostomy tube was removed at the patient's bedside.  The existing gastrostomy tube was injected with a small amount of contrast and several spot fluoroscopic images were obtained in various obliquities to confirm appropriate intraluminal positioning. A dressing was placed. The patient tolerated the procedure well without immediate postprocedural complication.  IMPRESSION: 1. Successful bedside removal of  the existing coaxial jejunostomy tube. As above, given frequent malpositioning of the jejunostomy lumen, this patient is no longer a candidate for percutaneous gastrojejunostomy tube placement. 2. Appropriately positioned and functioning existing 20 French balloon retention gastrostomy tube. No exchange performed. The gastrostomy tube is ready for immediate use.   Electronically Signed   By: Sandi Mariscal M.D.   On: 10/28/2013 15:31    Scheduled Meds: . dicyclomine  10 mg Per Tube TID AC & HS  . enoxaparin (LOVENOX) injection  40 mg Subcutaneous Q24H  . famotidine (PEPCID) IV  20 mg Intravenous Q12H  . fentaNYL  25 mcg Transdermal Q72H  . gabapentin  600 mg Per Tube 3 times per day  . insulin aspart  0-15 Units Subcutaneous 6 times per day  . insulin glargine  20 Units Subcutaneous QHS  . metoCLOPramide (REGLAN) injection  5 mg Intravenous 6 times per day  . mometasone-formoterol  2 puff Inhalation BID  . venlafaxine  75 mg Per J Tube Daily   Continuous Infusions: . sodium chloride 20 mL/hr at 10/28/13 2203  . Marland KitchenTPN (CLINIMIX-E) Adult 75 mL/hr at 10/29/13 0600   And  . fat emulsion 240 mL (10/29/13 0600)  . Marland KitchenTPN (CLINIMIX-E) Adult     And  . fat emulsion        Time spent: 25 minutes    [name]  [facility] Pager [phone] . If 7PM-7AM, please contact night-coverage at [email or web address], password [facility] 10/29/2013, 2:29 PM  LOS: 3 days

## 2013-10-29 NOTE — Progress Notes (Signed)
PARENTERAL NUTRITION CONSULT NOTE - Follow up  Pharmacy Consult for TNA Indication: intolerance to enteral feeding  Allergies  Allergen Reactions  . Other Hives and Shortness Of Breath    Bell Peppers   . Cefadroxil Hives    tolerated cefepime per [facility]  . Compazine [Prochlorperazine Edisylate] Hives    Tolerates promethazine  . Darvocet [Propoxyphene N-Acetaminophen] Hives  . Zofran [Ondansetron Hcl] Hives  . Nsaids Hives  . Penicillins Hives   Patient Measurements: Last weight: 74.8kg on 10/28/13 Usual Weight: 64.5kg per patient on 09/20/13  Vital Signs: Temp: 99 F (37.2 C) (03/31 0500) Temp src: Oral (03/31 0500) BP: 155/86 mmHg (03/31 0500) Pulse Rate: 87 (03/31 0500) Intake/Output from previous day: 03/30 0701 - 03/31 0700 In: 2585 [P.O.:120; I.V.:410; IV Piggyback:100; [id number] Out: 3300 [Urine:3300] Intake/Output from this shift: Total I/O In: 470 [I.V.:80; IV Piggyback:50; TPN:340] Out: -  Labs:  Recent Labs  10/27/13 0447 10/28/13 0443  WBC 9.3 6.2  HGB 8.1* 7.8*  HCT 24.1* 24.0*  PLT 175 160    Recent Labs  10/26/13 1145 10/27/13 0447 10/28/13 0443 10/29/13 0500  NA 133* 134* 135* 136*  K 3.9 3.8 3.7 3.8  CL 95* 102 102 103  CO2 25 24 24 24   GLUCOSE 377* 239* 183* 205*  BUN 14 26* 15 14  CREATININE 0.54 0.90 0.58 0.51  CALCIUM 8.9 8.2* 8.5 8.7  MG 1.4* 1.5 1.6  --   PHOS 3.5 4.6 3.9  --   PROT 7.5 5.7* 5.8*  --   ALBUMIN 2.7* 2.2* 2.3*  --   AST 32 24 25  --   ALT 46* 30 26  --   ALKPHOS 143* 99 93  --   BILITOT 0.2* <0.2* <0.2*  --   PREALBUMIN 25.0  --  22.1  --   TRIG 187*  --  175*  --   Corrected calcium = 10.06  Recent Labs  10/28/13 2327 10/29/13 0356 10/29/13 0807  GLUCAP 248* 198* 179*   Medications:  Scheduled:  . enoxaparin (LOVENOX) injection  40 mg Subcutaneous Q24H  . famotidine (PEPCID) IV  20 mg Intravenous Q12H  . fentaNYL  25 mcg Transdermal Q72H  . insulin aspart  0-15 Units Subcutaneous 6  times per day  . insulin glargine  20 Units Subcutaneous QHS  . labetalol  20 mg Intravenous Q12H  . metoCLOPramide (REGLAN) injection  5 mg Intravenous 6 times per day  . mometasone-formoterol  2 puff Inhalation BID   Infusions:  . sodium chloride 20 mL/hr at 10/28/13 2203  . sodium chloride 20 mL/hr at 10/29/13 0600  . Marland KitchenTPN (CLINIMIX-E) Adult 75 mL/hr at 10/29/13 0600   And  . fat emulsion 240 mL (10/29/13 0600)   CBGs and Insulin Requirements in the past 24 hours:  CBGs: 168, 179, 175, 190, 248, 198, 179.  31 units of Novolog used (on SSI moderate-scale q4h) Lantus increased to 20 units qHS by MD on 3/28.   Current Nutrition:  Diet: NPO Clinimix-E 5/20 at 75 mL/hr + Fat Emulsion 20% at 10 mL/hr IVF: NS at 20/hr  TNA formula from [facility]: Dextrose: 230 gm/d, Protein: 92 gm/d, Lipids daily, Kcal: 1849/d Electrolytes: Na 157 mEq/d, Phos 24 mmol/d, K+ 80 mEq/d, Ca: 10 mEq/d, Cl: 211.8 mEq/d, Mag: 20 mEq/d Trace elements: Addemel N: 10 ml/d Given as 18 hour cyclic infusion: 1hr ramp up, 16hr infusion, 1 hr ramp down  Nutritional Goals:  RD recommendations from 3/29: Kcal: 1600-1800/d,  Protein: 80-95 grams/d, Fluid: 2100 ml//daily   Clinimix E 5/15 at 75 ml/hr + IV Fat Emulsion 20% at 10 ml/hr will provide: 90 g protein/day, 1758 Kcal/day, 1.8 L/day Clinimix electrolytes: Ca 4.5 mEq/L, Acetate 80 mEq/L, Na 35 mEq/L, Cl 39 mEq/L, Phosphate 15 mmol/L, K+ 30 mEq/L, Magnesium 5 mEq/L  Assessment: 36 yoF admitted 3/28 with recurrent abdominal pain. Patient has a G J tube for medications that requires repositioning by IR. Patient was on home TNA for her malnutrition secondary to her severe retching and nausea vomiting which is a chronic issue for this patient. She has a Hx DM and sugars were >400 on admission. Pharmacy was consulted to dose TNA while inpatient. Given marked hyperglycemia, TNA was restarted as continuous rather a cyclic infusion.  Labs:  Electrolytes: Na slightly low but  improved (unable to adjust in TNA), other lytes WNL Renal Function: SCr, BUN - WNL, stable Hepatic Function:  LFTs improved, all now below ULN. Pre-Albumin: 25 (3/28), 22.1 (3/30) Triglycerides: 187 (3/28), 175 (3/30) - slightly elevated but improving CBGs: improving with only 1 value > 200 last 24hr on Lantus 20 units daily and SSI q4  TNA access: PICC placed 09/17/13 TNA day: continued from PTA  Plan:  Continue Clinimix E 5/15 @ 75 mL/hr (goal rate) Continue 20% fat emulsion at 10 ml/hr.  TNA to contain standard multivitamins and trace elements daily to match home formula despite national shortage Continue CBG checks q4h and moderate SSI as CBGs continue to improve.  Continue Lantus at 20 units qhs per MD. TNA lab panel on Mondays & Thursdays.  F/u daily.   Adrian Saran, PharmD, BCPS Pager [phone] 10/29/2013 10:20 AM

## 2013-10-30 DIAGNOSIS — I10 Essential (primary) hypertension: Secondary | ICD-10-CM

## 2013-10-30 DIAGNOSIS — F1910 Other psychoactive substance abuse, uncomplicated: Secondary | ICD-10-CM

## 2013-10-30 LAB — BASIC METABOLIC PANEL
BUN: 20 mg/dL (ref 6–23)
CALCIUM: 8.6 mg/dL (ref 8.4–10.5)
CO2: 23 mEq/L (ref 19–32)
Chloride: 100 mEq/L (ref 96–112)
Creatinine, Ser: 0.6 mg/dL (ref 0.50–1.10)
GFR calc Af Amer: >90 mL/min (ref >90)
GLUCOSE: 166 mg/dL — AB (ref 70–99)
Potassium: 3.6 mEq/L — ABNORMAL LOW (ref 3.7–5.3)
Sodium: 133 mEq/L — ABNORMAL LOW (ref 137–147)

## 2013-10-30 LAB — GLUCOSE, CAPILLARY
GLUCOSE-CAPILLARY: 128 mg/dL — AB (ref 70–99)
Glucose-Capillary: 224 mg/dL — ABNORMAL HIGH (ref 70–99)
Glucose-Capillary: 141 mg/dL — ABNORMAL HIGH (ref 70–99)

## 2013-10-30 MED ORDER — FAT EMULSION 20 % IV EMUL
240.0000 mL | INTRAVENOUS | Status: DC
  Filled 2013-10-30: qty 250

## 2013-10-30 MED ORDER — INSULIN GLARGINE 100 UNIT/ML ~~LOC~~ SOLN: 10.0000 [IU] | Freq: Every day | SUBCUTANEOUS | Status: AC

## 2013-10-30 MED ORDER — TRACE MINERALS CR-CU-F-FE-I-MN-MO-SE-ZN IV SOLN
INTRAVENOUS | Status: DC
  Filled 2013-10-30: qty 2000

## 2013-10-30 NOTE — Progress Notes (Addendum)
Verbal order for picc line removal by MD Daleen Bo, order entered,  MD Buriev and case manager Rhonda spoke with patient and advised regarding risks, he discussed with patient at length to stay overnight, start tube feeding; however she declined she wanted to go home AMA  Neta Mends RN 10-30-2013 16:17pm

## 2013-10-30 NOTE — Discharge Summary (Addendum)
Physician Discharge Summary  [name] [id number] DOB: 10-06-1970 DOA: 10/26/2013  PCP: [name], MD  Admit date: 10/26/2013 Discharge date: 10/30/2013  Time spent: >35 minutes  Recommendations for Outpatient Follow-up:  F/u with PCP in 1-2 weeks  Discharge Diagnoses:  Principal Problem:   Diabetic gastroparesis Active Problems:   Intractable nausea and vomiting   GERD (gastroesophageal reflux disease)   Hypertension   DM (diabetes mellitus), type 2, uncontrolled   Dehydration   Diabetic neuropathy   Protein-calorie malnutrition, severe   Hyperglycemia   Drug-seeking behavior   Vomiting   Chronic abdominal pain   Discharge Condition: stable   Diet recommendation:   Filed Weights   10/28/13 0147  Weight: 74.844 kg (165 lb)    History of present illness:  43 year old female with history of uncontrolled diabetes mellitus with diabetic gastroparesis status post gastrostomy tube placement, chronic abdominal pain, 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 Patient dehydrated with elevated blood glucose in ED.   Hospital Course:  1. Diabetic gastroparesis likely worse with opioids;  -improved on supportive care; ? patient was on TPN since January on PICC line; I called her gastroenterologist Juanita Craver, M.D, who reccommended to stop TPN, start tube feeding and tray PO intake; d/c PICC line -d/w patient at length to stay overnight, start tube feeding; however she declined she wanted to go home AMA:  -recommended to stop using opioids  2. Mispositioned GJ tube  -repositioned by IR; follow up with Dr Derrill Kay at [facility]. D/w [name]  3. DM (diabetes mellitus), , uncontrolled -increased to 20 units at bedtime. Last A1c of 8.6. Cont outpatient titration as needed   4. Anemia Chronic and  stable at present. No need for transfusion; 5. Chronic pain, no s/s of acute pancreatitis; ? Drug seeking behavior  -recommended to stop using opioids   Addendum: SIRS/likely no infectious cause; resolved    Patient leaving AMA  Procedures:  Repositioning PEG tube  (i.e. Studies not automatically included, echos, thoracentesis, etc; not x-rays)  Consultations:  IR  Discharge Exam: Filed Vitals:   10/30/13 1400  BP: 119/74  Pulse: 81  Temp: 98 F (36.7 C)  Resp: 18    General: alert Cardiovascular: s1,s2 rrr Respiratory: CTA BL  Discharge Instructions  Discharge Orders   Future Orders Complete By Expires   Diet - low sodium heart healthy  As directed    Discharge instructions  As directed    Comments:     Please follow up with primary care doctor in 1-2 weeks   Increase activity slowly  As directed        Medication List    STOP taking these medications       fentaNYL 25 MCG/HR patch  Commonly known as:  DURAGESIC - dosed mcg/hr     LORazepam 0.5 MG tablet  Commonly known as:  ATIVAN     sulfamethoxazole-trimethoprim 200-40 MG/5ML suspension  Commonly known as:  BACTRIM,SEPTRA      TAKE these medications       busPIRone 5 MG tablet  Commonly known as:  BUSPAR  Place 5 mg into feeding tube 3 (three) times daily.     dicyclomine 10 MG/5ML syrup  Commonly known as:  BENTYL  Place 10 mg into feeding tube 4 (four) times daily -  before meals and at bedtime.  Fluticasone-Salmeterol 250-50 MCG/DOSE Aepb  Commonly known as:  ADVAIR  Inhale 1 puff into the lungs 2 (two) times daily.     gabapentin 250 MG/5ML solution  Commonly known as:  NEURONTIN  Place 12 mLs (600 mg total) into feeding tube every 8 (eight) hours.     insulin glargine 100 UNIT/ML injection  Commonly known as:  LANTUS  Inject 0.1 mLs (10 Units total) into the skin at bedtime.     insulin lispro 100 UNIT/ML injection  Commonly known as:  HUMALOG  Inject 0-9 Units into the skin 3  (three) times daily before meals. Sliding scale: 0-150=0 units, 151-200= 2 units, 201-250= 4 units, 251-300=6 units, 301-350=8 units, 351-400=10 units, >400 = 9 units     labetalol 40 mg/mL Susp  Commonly known as:  TRANDATE  Place 200 mg into feeding tube every 12 (twelve) hours.     lisinopril 10 MG tablet  Commonly known as:  PRINIVIL,ZESTRIL  Place 10 mg into feeding tube daily.     metoCLOPramide 10 MG/10ML Soln  Commonly known as:  REGLAN  Place 10 mg into feeding tube 3 (three) times daily before meals.     mirtazapine 15 MG tablet  Commonly known as:  REMERON  Place 15 mg into feeding tube at bedtime.     multivitamin Liqd  Place 5 mLs into feeding tube daily.     pantoprazole sodium 40 mg/20 mL Pack  Commonly known as:  PROTONIX  Place 20 mLs (40 mg total) into feeding tube 2 (two) times daily.     sucralfate 1 GM/10ML suspension  Commonly known as:  CARAFATE  Place 10 mLs (1 g total) into feeding tube 4 (four) times daily -  with meals and at bedtime.     venlafaxine 75 MG tablet  Commonly known as:  EFFEXOR  Give 75 mg by tube daily.     zolpidem 10 MG tablet  Commonly known as:  AMBIEN  Place 10 mg into feeding tube at bedtime.       Allergies  Allergen Reactions  . Other Hives and Shortness Of Breath    Bell Peppers   . Cefadroxil Hives    tolerated cefepime per [facility]  . Compazine [Prochlorperazine Edisylate] Hives    Tolerates promethazine  . Darvocet [Propoxyphene N-Acetaminophen] Hives  . Zofran [Ondansetron Hcl] Hives  . Nsaids Hives  . Penicillins Hives       Follow-up Information   Follow up with [facility], MD In 1 week.   Specialty:  Internal Medicine   Contact information:   [address] [place] [address] [phone]        The results of significant diagnostics from this hospitalization (including imaging, microbiology, ancillary and laboratory) are listed below for reference.    Significant  Diagnostic Studies: Ir Cm Inj Any Colonic Tube W/fluoro  10/28/2013   INDICATION: 43 year old female well known to the interventional radiology department with past medical history significant for gastroparesis with chronic nausea and vomiting. She has had a percutaneous gastrojejunostomy tube which she frequently regurgitates from its normal position into either the gastric lumen or esophagus requiring manipulation under fluoroscopic guidance which happens on a nearly a monthly basis.  As such, it is has been deemed she is no longer a candidate for gastrojejunostomy tube replacement, rather, the gastrostomy tube will be left in place for venting purposes. Please remove the coaxial jejunostomy tube and ensure appropriate positioning of the existing gastrostomy.  EXAM:  FLUOROSCOPIC GUIDED EVALUATE OF GASTROSTOMY TUBE  COMPARISON:  DG ABD PORTABLE 1V dated 10/27/2013; SP REPLC GASTRO/JEJUNO TUBE PERCUT W/FLUORO dated 09/18/2013; IR GASTRIC TUBE PERC CHG W/O IMG GUIDE dated 09/13/2013; CT ABD/PELVIS W CM dated 08/15/2013  MEDICATIONS: None.  CONTRAST:  29mL OMNIPAQUE IOHEXOL 300 MG/ML  SOLN  FLUOROSCOPY TIME:  1 minute.  COMPLICATIONS: None immediate  PROCEDURE: Informed written consent was obtained from the patient after a discussion of the risks, benefits and alternatives to treatment. Questions regarding the procedure were encouraged and answered. A timeout was performed prior to the initiation of the procedure.  The coaxial jejunostomy tube was removed at the patient's bedside.  The existing gastrostomy tube was injected with a small amount of contrast and several spot fluoroscopic images were obtained in various obliquities to confirm appropriate intraluminal positioning. A dressing was placed. The patient tolerated the procedure well without immediate postprocedural complication.  IMPRESSION: 1. Successful bedside removal of the existing coaxial jejunostomy tube. As above, given frequent malpositioning of the  jejunostomy lumen, this patient is no longer a candidate for percutaneous gastrojejunostomy tube placement. 2. Appropriately positioned and functioning existing 20 French balloon retention gastrostomy tube. No exchange performed. The gastrostomy tube is ready for immediate use.   Electronically Signed   By: Sandi Mariscal M.D.   On: 10/28/2013 15:31   Dg Abd Acute W/chest  10/26/2013   CLINICAL DATA:  Left abdominal pain.  EXAM: ACUTE ABDOMEN SERIES (ABDOMEN 2 VIEW & CHEST 1 VIEW)  COMPARISON:  10/08/2013  FINDINGS: PICC line tip in the lower SVC. Lungs are clear bilaterally. Heart and mediastinum are within normal limits. The patient has a GJ feeding tube. The jejunal portion is now within the stomach and no longer the small bowel. Postsurgical bowel changes in the abdomen and post spinal surgery in the lower lumbar spine. There are a few air-fluid levels in the abdomen but these findings are nonspecific. Patient has a coronary artery stent.  IMPRESSION: Air-fluid levels in the abdomen are nonspecific. An early or partial bowel obstruction cannot be excluded.  The Thermal feeding tube is no longer in the small bowel. The tip appears to be in the stomach.  No acute chest findings.   Electronically Signed   By: Markus Daft M.D.   On: 10/26/2013 08:31   Dg Abd Acute W/chest  10/08/2013   CLINICAL DATA History of pancreatitis and gastric pairs is also COPD and diabetes ; PEG tube ; epigastric pain.  EXAM ACUTE ABDOMEN SERIES (ABDOMEN 2 VIEW & CHEST 1 VIEW)  COMPARISON DG ABD PORTABLE 1V dated 09/21/2013; DG CHEST 1V PORT dated 09/19/2013  FINDINGS The lungs are mildly hypoinflated. The interstitial markings are prominent but stable. The cardiac silhouette is top-normal in size. The pulmonary vascularity is not clearly engorged. A PICC line is in place via the right upper extremity with the tip in the region of the mid to distal SVC.  Within the abdomen a moderate stool burden is present throughout the colon. There is a PEG  tube present whose tip lies in the region of the transverse portion of the duodenum. There is stool and gas in the rectum. The bones are osteopenic. There are surgical clips in the gallbladder fossa.  IMPRESSION 1. Increased stool burden within the colon is present. There is no evidence of a small or large bowel obstruction. 2. The PEG tube is in reasonable position with the tip in the transverse portion of the duodenum. 3. There is no evidence of  active cardiopulmonary disease.  SIGNATURE  Electronically Signed   By: David  Martinique   On: 10/08/2013 16:55   Dg Abd Portable 1v  10/27/2013   CLINICAL DATA:  Abdominal pain, nausea and emesis. History of diverticulosis.  EXAM: PORTABLE ABDOMEN - 1 VIEW  COMPARISON:  DG ABD ACUTE W/CHEST dated 10/26/2013; DG ABD ACUTE W/CHEST dated 10/08/2013  FINDINGS: Bowel gas pattern is nondilated and nonobstructive. Gastrojejunostomy tube in left upper quadrant, the jejunal segment appears looped back into the stomach. Surgical clips in the right upper quadrant may reflect cholecystectomy. L4-5 hemilaminectomy with interbody disc prosthesis. Phleboliths in the pelvis. Limited assessment for free air on this supine view.  IMPRESSION: Nonspecific bowel gas pattern.  Gastrojejunostomy tip now projects in the stomach, as previously reported .   Electronically Signed   By: Elon Alas   On: 10/27/2013 05:54    Microbiology: Recent Results (from the past 240 hour(s))  CULTURE, BLOOD (ROUTINE X 2)     Status: None   Collection Time    10/26/13 11:19 AM      Result Value Ref Range Status   Specimen Description BLOOD LEFT HAND   Final   Special Requests BOTTLES DRAWN AEROBIC AND ANAEROBIC 2.5CC   Final   Culture  Setup Time     Final   Value: 10/26/2013 18:45     Performed at [facility]   Culture     Final   Value:        BLOOD CULTURE RECEIVED NO GROWTH TO DATE CULTURE WILL BE HELD FOR 5 DAYS BEFORE ISSUING A FINAL NEGATIVE REPORT     Performed at [facility]   Report Status PENDING   Incomplete  CULTURE, BLOOD (ROUTINE X 2)     Status: None   Collection Time    10/26/13 11:40 AM      Result Value Ref Range Status   Specimen Description BLOOD LEFT HAND   Final   Special Requests BOTTLES DRAWN AEROBIC AND ANAEROBIC 2CC   Final   Culture  Setup Time     Final   Value: 10/26/2013 18:45     Performed at [facility]   Culture     Final   Value:        BLOOD CULTURE RECEIVED NO GROWTH TO DATE CULTURE WILL BE HELD FOR 5 DAYS BEFORE ISSUING A FINAL NEGATIVE REPORT     Performed at [facility]   Report Status PENDING   Incomplete     Labs: Basic Metabolic Panel:  Recent Labs Lab 10/26/13 1145 10/27/13 0447 10/28/13 0443 10/29/13 0500 10/30/13 0555  NA 133* 134* 135* 136* 133*  K 3.9 3.8 3.7 3.8 3.6*  CL 95* 102 102 103 100  CO2 25 24 24 24 23   GLUCOSE 377* 239* 183* 205* 166*  BUN 14 26* 15 14 20   CREATININE 0.54 0.90 0.58 0.51 0.60  CALCIUM 8.9 8.2* 8.5 8.7 8.6  MG 1.4* 1.5 1.6  --   --   PHOS 3.5 4.6 3.9  --   --    Liver Function Tests:  Recent Labs Lab 10/26/13 0535 10/26/13 1145 10/27/13 0447 10/28/13 0443  AST 46* 32 24 25  ALT 62* 46* 30 26  ALKPHOS 172* 143* 99 93  BILITOT 0.3 0.2* <0.2* <0.2*  PROT 8.6* 7.5 5.7* 5.8*  ALBUMIN 3.2* 2.7* 2.2* 2.3*    Recent Labs Lab 10/26/13 0535  LIPASE 33   No results found for  this basename: AMMONIA,  in the last 168 hours CBC:  Recent Labs Lab 10/26/13 0535 10/27/13 0447 10/28/13 0443  WBC 16.4* 9.3 6.2  NEUTROABS 14.0* 5.6 3.0  HGB 11.7* 8.1* 7.8*  HCT 35.2* 24.1* 24.0*  MCV 85.2 86.7 88.2  PLT 262 175 160   Cardiac Enzymes: No results found for this basename: CKTOTAL, CKMB, CKMBINDEX, TROPONINI,  in the last 168 hours BNP: BNP (last 3 results) No results found for this basename: PROBNP,  in the last 8760 hours CBG:  Recent Labs Lab 10/29/13 1930 10/29/13 2331 10/30/13 0332 10/30/13 0848 10/30/13 1201  GLUCAP 290* 261*  128* 224* 141*       Signed:  Rowe Clack N  [facility] 10/30/2013, 3:02 PM

## 2013-10-30 NOTE — Progress Notes (Signed)
Cul[place]per, RN, BSN, CCM [phone] Chart Reviewed for discharge and hospital needs. Discharge needs at time of review:  None present will follow for needs. Review of patient progress due on [id number].

## 2013-10-30 NOTE — Progress Notes (Signed)
Nutrition Note  -Received consult for tube feed recommendations upon d/c. Per discussion with RN, pt refusing to go home with TPN or TF and will be leaving AMA  -MD has discussed risk of leaving w/out nutrition support at lengths with patient -Pt will likely progress in malnourished state without use of parenteral or enteral nutrition   Please re-consult as needed. Atlee Abide MS RD LDN Clinical Dietitian [phone]

## 2013-10-30 NOTE — Progress Notes (Signed)
PARENTERAL NUTRITION CONSULT NOTE - Follow up  Pharmacy Consult for TNA Indication: intolerance to enteral feeding  Allergies  Allergen Reactions  . Other Hives and Shortness Of Breath    Bell Peppers   . Cefadroxil Hives    tolerated cefepime per [facility]  . Compazine [Prochlorperazine Edisylate] Hives    Tolerates promethazine  . Darvocet [Propoxyphene N-Acetaminophen] Hives  . Zofran [Ondansetron Hcl] Hives  . Nsaids Hives  . Penicillins Hives   Patient Measurements: Last weight: 74.8kg on 10/28/13 Usual Weight: 64.5kg per patient on 09/20/13  Vital Signs: Temp: 97.8 F (36.6 C) (04/01 0520) Temp src: Oral (04/01 0520) BP: 135/76 mmHg (04/01 0520) Pulse Rate: 76 (04/01 0520) Intake/Output from previous day: 03/31 0701 - 04/01 0700 In: 1824.8 [I.V.:80; IV Piggyback:100; TPN:1644.8] Out: 1900 [Urine:1900] Intake/Output from this shift:   Labs:  Recent Labs  10/28/13 0443  WBC 6.2  HGB 7.8*  HCT 24.0*  PLT 160    Recent Labs  10/28/13 0443 10/29/13 0500 10/30/13 0555  NA 135* 136* 133*  K 3.7 3.8 3.6*  CL 102 103 100  CO2 24 24 23   GLUCOSE 183* 205* 166*  BUN 15 14 20   CREATININE 0.58 0.51 0.60  CALCIUM 8.5 8.7 8.6  MG 1.6  --   --   PHOS 3.9  --   --   PROT 5.8*  --   --   ALBUMIN 2.3*  --   --   AST 25  --   --   ALT 26  --   --   ALKPHOS 93  --   --   BILITOT <0.2*  --   --   PREALBUMIN 22.1  --   --   TRIG 175*  --   --   Corrected calcium = 10.06  Recent Labs  10/29/13 2331 10/30/13 0332 10/30/13 0848  GLUCAP 261* 128* 224*   Medications:  Scheduled:  . dicyclomine  10 mg Per Tube TID AC & HS  . enoxaparin (LOVENOX) injection  40 mg Subcutaneous Q24H  . famotidine (PEPCID) IV  20 mg Intravenous Q12H  . fentaNYL  25 mcg Transdermal Q72H  . gabapentin  600 mg Per Tube 3 times per day  . insulin aspart  0-15 Units Subcutaneous 6 times per day  . insulin glargine  20 Units Subcutaneous QHS  . labetalol  200 mg Per Tube BID   . metoCLOPramide (REGLAN) injection  5 mg Intravenous 6 times per day  . mometasone-formoterol  2 puff Inhalation BID  . venlafaxine  75 mg Per J Tube Daily   Infusions:  . sodium chloride 20 mL/hr at 10/28/13 2203  . Marland KitchenTPN (CLINIMIX-E) Adult 75 mL/hr at 10/29/13 1812   And  . fat emulsion 240 mL (10/29/13 1812)   CBGs and Insulin Requirements in the past 24 hours:  CBGs: [id number] 128,224.  37 units of Novolog used (on SSI moderate-scale q4h) Lantus increased to 20 units qHS by MD on 3/28.   Current Nutrition:  Diet: NPO Clinimix-E 5/20 at 75 mL/hr + Fat Emulsion 20% at 10 mL/hr IVF: NS at 20/hr  TNA formula from [facility]: Dextrose: 230 gm/d, Protein: 92 gm/d, Lipids daily, Kcal: 1849/d Electrolytes: Na 157 mEq/d, Phos 24 mmol/d, K+ 80 mEq/d, Ca: 10 mEq/d, Cl: 211.8 mEq/d, Mag: 20 mEq/d Trace elements: Addemel N: 10 ml/d Given as 18 hour cyclic infusion: 1hr ramp up, 16hr infusion, 1 hr ramp down  Nutritional Goals:  RD recommendations from 3/29: Kcal:  1600-1800/d, Protein: 80-95 grams/d, Fluid: 2100 ml//daily   Clinimix E 5/15 at 75 ml/hr + IV Fat Emulsion 20% at 10 ml/hr will provide: 90 g protein/day, 1758 Kcal/day, 1.8 L/day Clinimix electrolytes: Ca 4.5 mEq/L, Acetate 80 mEq/L, Na 35 mEq/L, Cl 39 mEq/L, Phosphate 15 mmol/L, K+ 30 mEq/L, Magnesium 5 mEq/L  Assessment: 66 yoF admitted 3/28 with recurrent abdominal pain. Patient has a G J tube for medications that requires repositioning by IR. Patient was on home TNA for her malnutrition secondary to her severe retching and nausea vomiting which is a chronic issue for this patient. She has a Hx DM and sugars were >400 on admission. Pharmacy was consulted to dose TNA while inpatient. Given marked hyperglycemia, TNA was restarted as continuous rather a cyclic infusion.  Labs:  Electrolytes: Na 133, improved (unable to adjust in TNA), other lytes WNL Renal Function: SCr, BUN - WNL, stable Hepatic Function:  LFTs improved, all now  below ULN. Pre-Albumin: 25 (3/28), 22.1 (3/30) Triglycerides: 187 (3/28), 175 (3/30) - slightly elevated but improving CBGs: improving on Lantus 20 units daily and SSI q4  TNA access: PICC placed 09/17/13 TNA day: continued from PTA  Plan:  Continue Clinimix E 5/15 @ 75 mL/hr (goal rate) Continue 20% fat emulsion at 10 ml/hr.  TNA to contain standard multivitamins and trace elements daily to match home formula despite national shortage Continue CBG checks q4h and moderate SSI as CBGs continue to improve.  Continue Lantus at 20 units qhs per MD. TNA lab panel on Mondays & Thursdays.  F/u daily.   Minda Ditto PharmD Pager [phone] 10/30/2013, 11:23 AM

## 2013-10-31 NOTE — Progress Notes (Signed)
Late entry for [id number]/a\patient refusing to wait to see if withdrwal of tpn and start of j-tube feeds would be successful.  Patient even after being told that she may be putting her health at risk and should at least wait until 24 hours refused to stay.  Patient left ama.

## 2013-11-01 LAB — CULTURE, BLOOD (ROUTINE X 2)
CULTURE: NO GROWTH
Culture: NO GROWTH

## 2013-11-11 ENCOUNTER — Emergency Department (HOSPITAL_COMMUNITY): Payer: Medicare Other

## 2013-11-11 ENCOUNTER — Encounter (HOSPITAL_COMMUNITY): Payer: Self-pay | Admitting: Emergency Medicine

## 2013-11-11 ENCOUNTER — Emergency Department (HOSPITAL_COMMUNITY)
Admission: EM | Admit: 2013-11-11 | Discharge: 2013-11-11 | Disposition: A | Discharge status: Home / Self Care | Payer: Medicare Other | Attending: Emergency Medicine | Admitting: Emergency Medicine

## 2013-11-11 DIAGNOSIS — K219 Gastro-esophageal reflux disease without esophagitis: Secondary | ICD-10-CM | POA: Insufficient documentation

## 2013-11-11 DIAGNOSIS — G40909 Epilepsy, unspecified, not intractable, without status epilepticus: Secondary | ICD-10-CM | POA: Insufficient documentation

## 2013-11-11 DIAGNOSIS — G8929 Other chronic pain: Secondary | ICD-10-CM | POA: Insufficient documentation

## 2013-11-11 DIAGNOSIS — R112 Nausea with vomiting, unspecified: Secondary | ICD-10-CM | POA: Insufficient documentation

## 2013-11-11 DIAGNOSIS — I2510 Atherosclerotic heart disease of native coronary artery without angina pectoris: Secondary | ICD-10-CM | POA: Insufficient documentation

## 2013-11-11 DIAGNOSIS — F1123 Opioid dependence with withdrawal: Secondary | ICD-10-CM

## 2013-11-11 DIAGNOSIS — J449 Chronic obstructive pulmonary disease, unspecified: Secondary | ICD-10-CM | POA: Insufficient documentation

## 2013-11-11 DIAGNOSIS — Z9089 Acquired absence of other organs: Secondary | ICD-10-CM | POA: Insufficient documentation

## 2013-11-11 DIAGNOSIS — R197 Diarrhea, unspecified: Secondary | ICD-10-CM | POA: Insufficient documentation

## 2013-11-11 DIAGNOSIS — T85598A Other mechanical complication of other gastrointestinal prosthetic devices, implants and grafts, initial encounter: Secondary | ICD-10-CM

## 2013-11-11 DIAGNOSIS — R109 Unspecified abdominal pain: Secondary | ICD-10-CM

## 2013-11-11 DIAGNOSIS — E1142 Type 2 diabetes mellitus with diabetic polyneuropathy: Secondary | ICD-10-CM | POA: Insufficient documentation

## 2013-11-11 DIAGNOSIS — K9423 Gastrostomy malfunction: Secondary | ICD-10-CM | POA: Insufficient documentation

## 2013-11-11 DIAGNOSIS — E876 Hypokalemia: Secondary | ICD-10-CM

## 2013-11-11 DIAGNOSIS — Z9861 Coronary angioplasty status: Secondary | ICD-10-CM | POA: Insufficient documentation

## 2013-11-11 DIAGNOSIS — F1193 Opioid use, unspecified with withdrawal: Secondary | ICD-10-CM

## 2013-11-11 DIAGNOSIS — R1084 Generalized abdominal pain: Secondary | ICD-10-CM | POA: Insufficient documentation

## 2013-11-11 DIAGNOSIS — Z79899 Other long term (current) drug therapy: Secondary | ICD-10-CM | POA: Insufficient documentation

## 2013-11-11 DIAGNOSIS — F19939 Other psychoactive substance use, unspecified with withdrawal, unspecified: Secondary | ICD-10-CM | POA: Insufficient documentation

## 2013-11-11 DIAGNOSIS — J4489 Other specified chronic obstructive pulmonary disease: Secondary | ICD-10-CM | POA: Insufficient documentation

## 2013-11-11 DIAGNOSIS — Z794 Long term (current) use of insulin: Secondary | ICD-10-CM | POA: Insufficient documentation

## 2013-11-11 DIAGNOSIS — Z880 Allergy status to penicillin: Secondary | ICD-10-CM | POA: Insufficient documentation

## 2013-11-11 DIAGNOSIS — I10 Essential (primary) hypertension: Secondary | ICD-10-CM | POA: Insufficient documentation

## 2013-11-11 DIAGNOSIS — Z90710 Acquired absence of both cervix and uterus: Secondary | ICD-10-CM | POA: Insufficient documentation

## 2013-11-11 DIAGNOSIS — E1049 Type 1 diabetes mellitus with other diabetic neurological complication: Secondary | ICD-10-CM | POA: Insufficient documentation

## 2013-11-11 DIAGNOSIS — I252 Old myocardial infarction: Secondary | ICD-10-CM | POA: Insufficient documentation

## 2013-11-11 DIAGNOSIS — Z87891 Personal history of nicotine dependence: Secondary | ICD-10-CM | POA: Insufficient documentation

## 2013-11-11 LAB — URINALYSIS, ROUTINE W REFLEX MICROSCOPIC
BILIRUBIN URINE: NEGATIVE
GLUCOSE, UA: 500 mg/dL — AB
KETONES UR: NEGATIVE mg/dL
Leukocytes, UA: NEGATIVE
Nitrite: NEGATIVE
PH: 8 (ref 5.0–8.0)
Specific Gravity, Urine: 1.013 (ref 1.005–1.030)
Urobilinogen, UA: 0.2 mg/dL (ref 0.0–1.0)

## 2013-11-11 LAB — CBC WITH DIFFERENTIAL/PLATELET
BASOS ABS: 0 10*3/uL (ref 0.0–0.1)
Basophils Relative: 0 % (ref 0–1)
Eosinophils Absolute: 0 10*3/uL (ref 0.0–0.7)
Eosinophils Relative: 0 % (ref 0–5)
HCT: 35.4 % — ABNORMAL LOW (ref 36.0–46.0)
HEMOGLOBIN: 11.9 g/dL — AB (ref 12.0–15.0)
Lymphocytes Relative: 15 % (ref 12–46)
Lymphs Abs: 2 10*3/uL (ref 0.7–4.0)
MCH: 29 pg (ref 26.0–34.0)
MCHC: 33.6 g/dL (ref 30.0–36.0)
MCV: 86.1 fL (ref 78.0–100.0)
Monocytes Absolute: 0.5 10*3/uL (ref 0.1–1.0)
Monocytes Relative: 4 % (ref 3–12)
NEUTROS ABS: 10.8 10*3/uL — AB (ref 1.7–7.7)
NEUTROS PCT: 81 % — AB (ref 43–77)
Platelets: 284 10*3/uL (ref 150–400)
RBC: 4.11 MIL/uL (ref 3.87–5.11)
RDW: 14.9 % (ref 11.5–15.5)
WBC: 13.3 10*3/uL — AB (ref 4.0–10.5)

## 2013-11-11 LAB — COMPREHENSIVE METABOLIC PANEL
ALBUMIN: 3.4 g/dL — AB (ref 3.5–5.2)
ALK PHOS: 179 U/L — AB (ref 39–117)
ALT: 75 U/L — AB (ref 0–35)
AST: 56 U/L — AB (ref 0–37)
BILIRUBIN TOTAL: 0.3 mg/dL (ref 0.3–1.2)
BUN: 6 mg/dL (ref 6–23)
CHLORIDE: 91 meq/L — AB (ref 96–112)
CO2: 30 mEq/L (ref 19–32)
Calcium: 9.7 mg/dL (ref 8.4–10.5)
Creatinine, Ser: 0.58 mg/dL (ref 0.50–1.10)
GFR calc Af Amer: >90 mL/min (ref >90)
Glucose, Bld: 332 mg/dL — ABNORMAL HIGH (ref 70–99)
POTASSIUM: 2.9 meq/L — AB (ref 3.7–5.3)
Sodium: 138 mEq/L (ref 137–147)
Total Protein: 8.2 g/dL (ref 6.0–8.3)

## 2013-11-11 LAB — RAPID URINE DRUG SCREEN, HOSP PERFORMED
Amphetamines: NOT DETECTED
BENZODIAZEPINES: NOT DETECTED
Barbiturates: NOT DETECTED
Cocaine: NOT DETECTED
Opiates: NOT DETECTED
TETRAHYDROCANNABINOL: NOT DETECTED

## 2013-11-11 LAB — URINE MICROSCOPIC-ADD ON

## 2013-11-11 LAB — CBG MONITORING, ED: GLUCOSE-CAPILLARY: 306 mg/dL — AB (ref 70–99)

## 2013-11-11 LAB — LIPASE, BLOOD: Lipase: 16 U/L (ref 11–59)

## 2013-11-11 MED ORDER — PROMETHAZINE HCL 25 MG PO TABS: 25.0000 mg | ORAL_TABLET | Freq: Three times a day (TID) | ORAL | Status: DC | PRN

## 2013-11-11 MED ORDER — SODIUM CHLORIDE 0.9 % IV SOLN
1000.0000 mL | INTRAVENOUS | Status: DC
  Administered 2013-11-11: 1000 mL via INTRAVENOUS

## 2013-11-11 MED ORDER — METOCLOPRAMIDE HCL 5 MG/ML IJ SOLN
10.0000 mg | Freq: Once | INTRAMUSCULAR | Status: AC
  Administered 2013-11-11: 10 mg via INTRAVENOUS
  Filled 2013-11-11: qty 2

## 2013-11-11 MED ORDER — LORAZEPAM 1 MG PO TABS: 1.0000 mg | ORAL_TABLET | Freq: Three times a day (TID) | ORAL | Status: DC | PRN

## 2013-11-11 MED ORDER — DIPHENHYDRAMINE HCL 50 MG/ML IJ SOLN
50.0000 mg | Freq: Once | INTRAMUSCULAR | Status: AC
  Administered 2013-11-11: 50 mg via INTRAVENOUS
  Filled 2013-11-11: qty 1

## 2013-11-11 MED ORDER — PROMETHAZINE HCL 25 MG/ML IJ SOLN
25.0000 mg | Freq: Once | INTRAMUSCULAR | Status: AC
  Administered 2013-11-11: 25 mg via INTRAMUSCULAR
  Filled 2013-11-11: qty 1

## 2013-11-11 MED ORDER — LORAZEPAM 2 MG/ML IJ SOLN
1.0000 mg | Freq: Once | INTRAMUSCULAR | Status: AC
  Administered 2013-11-11: 1 mg via INTRAVENOUS
  Filled 2013-11-11: qty 1

## 2013-11-11 MED ORDER — POTASSIUM CHLORIDE 10 MEQ/100ML IV SOLN
10.0000 meq | INTRAVENOUS | Status: AC
  Administered 2013-11-11 (×4): 10 meq via INTRAVENOUS
  Filled 2013-11-11 (×4): qty 100

## 2013-11-11 MED ORDER — SODIUM CHLORIDE 0.9 % IV SOLN
1000.0000 mL | Freq: Once | INTRAVENOUS | Status: AC
  Administered 2013-11-11: 1000 mL via INTRAVENOUS

## 2013-11-11 MED ORDER — SODIUM CHLORIDE 0.9 % IV SOLN: 1000.0000 mL | Freq: Once | INTRAVENOUS | Status: DC

## 2013-11-11 MED ORDER — POTASSIUM CHLORIDE CRYS ER 20 MEQ PO TBCR: 20.0000 meq | EXTENDED_RELEASE_TABLET | Freq: Two times a day (BID) | ORAL | Status: DC

## 2013-11-11 NOTE — ED Notes (Signed)
Bed: [id number] Expected date:  Expected time:  Means of arrival:  Comments: ems- pancreatitis, vomiting,

## 2013-11-11 NOTE — ED Notes (Signed)
Pt aware of need of urine sample. Unable to void at this time 

## 2013-11-11 NOTE — ED Provider Notes (Signed)
CSN: [id number]     Arrival date & time 11/11/13  0809 History   First MD Initiated Contact with Patient 11/11/13 [phone]     Chief Complaint  Patient presents with  . Emesis  . Abdominal Pain     (Consider location/radiation/quality/duration/timing/severity/associated sxs/prior Treatment) HPI This is the patient's 15th  ED visit in 6 months. She was just admitted to the hospital March 28 to April 1 for chronic abdominal pain, nausea, vomiting and diarrhea. Patient has a PEG tube and was on TPN. She is now getting tube feeding. While she was in the hospital this last time they decided to stop her narcotics. They felt like it may be making her abdominal pain worse. However on review of her outside charts she was admitted at [facility] from April 4 through April 11. At that time they kept her on her fentanyl patches and she was given liquid oxycodone to take through her PEG tube at discharge with 500 cc.. Patient states she started vomiting "a lot" last night. She also is complaining of diarrhea. She is complaining of diffuse abdominal pain. She denies any fever. Patient states she feels like her feeding tube is in her chest. She has had the feeding tube  for about a year.  PCP Dr Jonelle Sidle  Past Medical History  Diagnosis Date  . Hypertension   . 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 Gastroparesis due to DM   . GERD (gastroesophageal reflux disease)   . Coronary artery disease   . Diabetic neuropathy, painful   . Chronic pancreatitis   . Dyslipidemia   . MI (myocardial infarction)   . COPD (chronic obstructive pulmonary disease)   . Seizures   . Diverticulosis     s/p partial bowel resection   Past Surgical History  Procedure Laterality Date  . Abdominal hysterectomy    . Cholecystectomy    . Peg tube x 4      feeding jejunostomies with PEG tubes  . Cesarean section    . Colon resection due to diverticulitis    . Back surgery    . Esophagogastroduodenoscopy  N/A 12/27/2012    Procedure: ESOPHAGOGASTRODUODENOSCOPY (EGD);  Surgeon: Missy Sabins, MD;  Location: [facility];  Service: Endoscopy;  Laterality: N/A;  . Esophagogastroduodenoscopy (egd) with esophageal dilation N/A 02/19/2013    Procedure: ESOPHAGOGASTRODUODENOSCOPY (EGD) WITH ESOPHAGEAL DILATION;  Surgeon: Wonda Horner, MD;  Location: [facility];  Service: Endoscopy;  Laterality: N/A;  . Balloon dilation N/A 02/19/2013    Procedure: BALLOON DILATION;  Surgeon: Wonda Horner, MD;  Location: [facility];  Service: Endoscopy;  Laterality: N/A;  . Coronary stent placement      2 stents [place]  . Esophagogastroduodenoscopy N/A 09/13/2013    Procedure: ESOPHAGOGASTRODUODENOSCOPY (EGD);  Surgeon: Cleotis Nipper, MD;  Location: [facility];  Service: Endoscopy;  Laterality: N/A;   Family History  Problem Relation Age of Onset  . Endometrial cancer Mother   . Diabetes Mellitus II Maternal Grandmother   . Diabetes Mellitus II Mother   . Diabetes Mellitus II Sister   . Emphysema Mother   . Heart attack Father    History  Substance Use Topics  . Smoking status: Former Smoker -- 0.25 packs/day for 6 years    Types: Cigarettes, Cigars    Quit date: 08/01/2013  . Smokeless tobacco: Never Used  . Alcohol Use: No     OB History   Grav Para Term Preterm Abortions TAB SAB  Ect Mult Living                 Review of Systems  All other systems reviewed and are negative.     Allergies  Other; Cefadroxil; Compazine; Darvocet; Zofran; Nsaids; and Penicillins  Home Medications   Current Outpatient Rx  Name  Route  Sig  Dispense  Refill  . busPIRone (BUSPAR) 5 MG tablet   Per Tube   Place 5 mg into feeding tube 3 (three) times daily.         Marland Kitchen dicyclomine (BENTYL) 10 MG/5ML syrup   Per Tube   Place 10 mg into feeding tube 4 (four) times daily -  before meals and at bedtime.          . Fluticasone-Salmeterol (ADVAIR) 250-50 MCG/DOSE AEPB   Inhalation   Inhale 1 puff into  the lungs 2 (two) times daily.   60 each   0   . gabapentin (NEURONTIN) 250 MG/5ML solution   Per Tube   Place 12 mLs (600 mg total) into feeding tube every 8 (eight) hours.   473 mL   0   . insulin glargine (LANTUS) 100 UNIT/ML injection   Subcutaneous   Inject 0.1 mLs (10 Units total) into the skin at bedtime.   20 mL   11   . insulin lispro (HUMALOG) 100 UNIT/ML injection   Subcutaneous   Inject 0-9 Units into the skin 3 (three) times daily before meals. Sliding scale: 0-150=0 units, 151-200= 2 units, 201-250= 4 units, 251-300=6 units, 301-350=8 units, 351-400=10 units, >400 = 9 units         . labetalol (TRANDATE) 40 mg/mL SUSP   Per Tube   Place 200 mg into feeding tube every 12 (twelve) hours.         Marland Kitchen lisinopril (PRINIVIL,ZESTRIL) 10 MG tablet   Per Tube   Place 10 mg into feeding tube daily.          . metoCLOPramide (REGLAN) 10 MG/10ML SOLN   Per Tube   Place 10 mg into feeding tube 3 (three) times daily before meals.         . mirtazapine (REMERON) 15 MG tablet   Per Tube   Place 15 mg into feeding tube at bedtime.         . Multiple Vitamin (MULTIVITAMIN) LIQD   Per Tube   Place 5 mLs into feeding tube daily.   60 mL   1   . pantoprazole sodium (PROTONIX) 40 mg/20 mL PACK   Per Tube   Place 20 mLs (40 mg total) into feeding tube 2 (two) times daily.   30 each   0   . sucralfate (CARAFATE) 1 GM/10ML suspension   Per Tube   Place 10 mLs (1 g total) into feeding tube 4 (four) times daily -  with meals and at bedtime.   420 mL   0   . venlafaxine (EFFEXOR) 75 MG tablet   Tube   Give 75 mg by tube daily.          Marland Kitchen zolpidem (AMBIEN) 10 MG tablet   Per Tube   Place 10 mg into feeding tube at bedtime.          BP 174/98  Pulse 116  Temp(Src) 98.2 F (36.8 C) (Oral)  Resp 21  SpO2 100%  Vital signs normal except tachycardia and hypertension  Physical Exam  Nursing note and vitals reviewed. Constitutional: She is oriented to  person, place,  and time. She appears well-developed and well-nourished.  Non-toxic appearance. She does not appear ill. No distress.  Actively retching, clear brown tinged fluid.   HENT:  Head: Normocephalic and atraumatic.  Right Ear: External ear normal.  Left Ear: External ear normal.  Nose: Nose normal. No mucosal edema or rhinorrhea.  Mouth/Throat: Oropharynx is clear and moist and mucous membranes are normal. No dental abscesses or uvula swelling.  Eyes: Conjunctivae and EOM are normal. Pupils are equal, round, and reactive to light.  Neck: Normal range of motion and full passive range of motion without pain. Neck supple.  Cardiovascular: Normal rate, regular rhythm and normal heart sounds.  Exam reveals no gallop and no friction rub.   No murmur heard. Pulmonary/Chest: Effort normal and breath sounds normal. No respiratory distress. She has no wheezes. She has no rhonchi. She has no rales. She exhibits no tenderness and no crepitus.  Abdominal: Soft. Normal appearance and bowel sounds are normal. She exhibits no distension. There is generalized tenderness. There is no rebound and no guarding.  Musculoskeletal: Normal range of motion. She exhibits no edema and no tenderness.  Moves all extremities well.   Neurological: She is alert and oriented to person, place, and time. She has normal strength. No cranial nerve deficit.  Skin: Skin is warm, dry and intact. No rash noted. No erythema. No pallor.  Psychiatric: She has a normal mood and affect. Her speech is normal and behavior is normal. Her mood appears not anxious.    ED Course  Procedures (including critical care time)  Medications  0.9 %  sodium chloride infusion (0 mLs Intravenous Stopped 11/11/13 1001)    Followed by  0.9 %  sodium chloride infusion (0 mLs Intravenous Stopped 11/11/13 1218)    Followed by  0.9 %  sodium chloride infusion (1,000 mLs Intravenous New Bag/Given 11/11/13 1055)  LORazepam (ATIVAN) injection 1 mg (1  mg Intravenous Given 11/11/13 0934)  metoCLOPramide (REGLAN) injection 10 mg (10 mg Intravenous Given 11/11/13 0934)  diphenhydrAMINE (BENADRYL) injection 50 mg (50 mg Intravenous Given 11/11/13 0935)  promethazine (PHENERGAN) injection 25 mg (25 mg Intramuscular Given 11/11/13 0955)  potassium chloride 10 mEq in 100 mL IVPB (10 mEq Intravenous New Bag/Given 11/11/13 1410)  metoCLOPramide (REGLAN) injection 10 mg (10 mg Intravenous Given 11/11/13 1148)  LORazepam (ATIVAN) injection 1 mg (1 mg Intravenous Given 11/11/13 1149)  promethazine (PHENERGAN) injection 25 mg (25 mg Intramuscular Given 11/11/13 1217)   Patient was just admitted at [facility] from April 4 through the 11th. She states when she was discharged she was not given any pain medication prescriptions.  Pt has an appt on 4/23 with her pain management doctor Dr Lianne Cure at [facility], his PA Lockie Pares on 5/19 and her GI PA [place] on June 15th.   11:28 Dr Reesa Chew reviewed her xrays will remove tube and replace it  12:00 patient's tube was removed by IR and a G-tube was placed. They state they were seeing patient once a month for her GJ tube being malpositioned and now about every 2 weeks. He states they replace it with a G tube but her GI doctors at [facility] keep replacing it with a GJ tube. He suggested to nursing staff to look up the tube to suction to decompress her stomach. Patient still continues to complain of pain. I have advised her she's not getting pain medication. We are giving her IV fluids and treating her nausea.  Patient was started on IV potassium for  her hypokalemia.   1540 nurse reports the IV potassium runs are in. Patient has had no further episodes of vomiting. At this point she is ready for discharge.  Labs Review Results for orders placed during the hospital encounter of 11/11/13  CBC WITH DIFFERENTIAL      Result Value Ref Range   WBC 13.3 (*) 4.0 - 10.5 K/uL   RBC 4.11  3.87 - 5.11 MIL/uL   Hemoglobin  11.9 (*) 12.0 - 15.0 g/dL   HCT 35.4 (*) 36.0 - 46.0 %   MCV 86.1  78.0 - 100.0 fL   MCH 29.0  26.0 - 34.0 pg   MCHC 33.6  30.0 - 36.0 g/dL   RDW 14.9  11.5 - 15.5 %   Platelets 284  150 - 400 K/uL   Neutrophils Relative % 81 (*) 43 - 77 %   Neutro Abs 10.8 (*) 1.7 - 7.7 K/uL   Lymphocytes Relative 15  12 - 46 %   Lymphs Abs 2.0  0.7 - 4.0 K/uL   Monocytes Relative 4  3 - 12 %   Monocytes Absolute 0.5  0.1 - 1.0 K/uL   Eosinophils Relative 0  0 - 5 %   Eosinophils Absolute 0.0  0.0 - 0.7 K/uL   Basophils Relative 0  0 - 1 %   Basophils Absolute 0.0  0.0 - 0.1 K/uL  COMPREHENSIVE METABOLIC PANEL      Result Value Ref Range   Sodium 138  137 - 147 mEq/L   Potassium 2.9 (*) 3.7 - 5.3 mEq/L   Chloride 91 (*) 96 - 112 mEq/L   CO2 30  19 - 32 mEq/L   Glucose, Bld 332 (*) 70 - 99 mg/dL   BUN 6  6 - 23 mg/dL   Creatinine, Ser 0.58  0.50 - 1.10 mg/dL   Calcium 9.7  8.4 - 10.5 mg/dL   Total Protein 8.2  6.0 - 8.3 g/dL   Albumin 3.4 (*) 3.5 - 5.2 g/dL   AST 56 (*) 0 - 37 U/L   ALT 75 (*) 0 - 35 U/L   Alkaline Phosphatase 179 (*) 39 - 117 U/L   Total Bilirubin 0.3  0.3 - 1.2 mg/dL   GFR calc non Af Amer >90  >90 mL/min   GFR calc Af Amer >90  >90 mL/min  LIPASE, BLOOD      Result Value Ref Range   Lipase 16  11 - 59 U/L  URINALYSIS, ROUTINE W REFLEX MICROSCOPIC      Result Value Ref Range   Color, Urine YELLOW  YELLOW   APPearance CLEAR  CLEAR   Specific Gravity, Urine 1.013  1.005 - 1.030   pH 8.0  5.0 - 8.0   Glucose, UA 500 (*) NEGATIVE mg/dL   Hgb urine dipstick TRACE (*) NEGATIVE   Bilirubin Urine NEGATIVE  NEGATIVE   Ketones, ur NEGATIVE  NEGATIVE mg/dL   Protein, ur >300 (*) NEGATIVE mg/dL   Urobilinogen, UA 0.2  0.0 - 1.0 mg/dL   Nitrite NEGATIVE  NEGATIVE   Leukocytes, UA NEGATIVE  NEGATIVE  URINE RAPID DRUG SCREEN (HOSP PERFORMED)      Result Value Ref Range   Opiates NONE DETECTED  NONE DETECTED   Cocaine NONE DETECTED  NONE DETECTED   Benzodiazepines NONE  DETECTED  NONE DETECTED   Amphetamines NONE DETECTED  NONE DETECTED   Tetrahydrocannabinol NONE DETECTED  NONE DETECTED   Barbiturates NONE DETECTED  NONE DETECTED  URINE MICROSCOPIC-ADD  ON      Result Value Ref Range   Squamous Epithelial / LPF RARE  RARE   WBC, UA 0-2  <3 WBC/hpf   RBC / HPF 0-2  <3 RBC/hpf   Bacteria, UA RARE  RARE  CBG MONITORING, ED      Result Value Ref Range   Glucose-Capillary 306 (*) 70 - 99 mg/dL    Laboratory interpretation all normal except stable anemia, hypokalemia, hyperglycemia (patient took insulin about an hour prior to coming to the ED), mild elevation of LFTs with normal lipase   Imaging Review Dg Abd 1 View  11/11/2013   CLINICAL DATA:  43 year old female with nausea vomiting. Initial encounter.  EXAM: ABDOMEN - 1 VIEW  COMPARISON:  Chest radiographs from the same day reported separately. KUB 10/27/2013.  FINDINGS: Non obstructed bowel gas pattern. The percutaneous gastrostomy tube appears to extend cephalad toward the chest. See chest from today.  Stable right upper quadrant surgical clips. Stable postoperative appearance of the lumbar spine. Pelvic phleboliths. No acute osseous abnormality identified.  IMPRESSION: 1. Percutaneous gastrostomy tube appears to extend cephalad toward the chest. CT based chest radiograph reported separately. 2.  Non obstructed bowel gas pattern.   Electronically Signed   By: Lars Pinks M.D.   On: 11/11/2013 10:14   Dg Chest Portable 1 View  11/11/2013   CLINICAL DATA:  Nausea and vomiting. Patient feels like feeding tube is in the chest.  EXAM: PORTABLE CHEST - 1 VIEW  COMPARISON:  Acute abdominal series 10/26/2013.  FINDINGS: The distal end of a gastrojejunostomy tube is seen extending the retrograde from the stomach into the mid esophagus where it is coiled with tip in the mid esophagus. Lung volumes are normal. No consolidative airspace disease. No pleural effusions. No pneumothorax. No pulmonary nodule or mass noted.  Pulmonary vasculature and the cardiomediastinal silhouette are within normal limits.  IMPRESSION: 1. Gastrojejunostomy tube extends retrograde into the esophagus, with tip coiled in the mid esophagus.   Electronically Signed   By: Vinnie Langton M.D.   On: 11/11/2013 10:12   Ir Cm Inj Any Colonic Tube W/fluoro  10/28/2013   INDICATION: 43 year old female well known to the interventional radiology department with past medical history significant for gastroparesis with chronic nausea and vomiting. She has had a percutaneous gastrojejunostomy tube which she frequently regurgitates from its normal position into either the gastric lumen or esophagus requiring manipulation under fluoroscopic guidance which happens on a nearly a monthly basis.  As such, it is has been deemed she is no longer a candidate for gastrojejunostomy tube replacement, rather, the gastrostomy tube will be left in place for venting purposes. Please remove the coaxial jejunostomy tube and ensure appropriate positioning of the existing gastrostomy.  .  IMPRESSION: 1. Successful bedside removal of the existing coaxial jejunostomy tube. As above, given frequent malpositioning of the jejunostomy lumen, this patient is no longer a candidate for percutaneous gastrojejunostomy tube placement. 2. Appropriately positioned and functioning existing 20 French balloon retention gastrostomy tube. No exchange performed. The gastrostomy tube is ready for immediate use.   Electronically Signed   By: Sandi Mariscal M.D.   On: 10/28/2013 15:31    Dg Abd Portable 1v  10/27/2013   CLINICAL DATA:  Abdominal pain, nausea and emesis. History of diverticulosis.   IMPRESSION: Nonspecific bowel gas pattern.  Gastrojejunostomy tip now projects in the stomach, as previously reported .   Electronically Signed   By: Elon Alas   On: 10/27/2013 05:54  EKG Interpretation   Date/Time:  Monday November 11 2013 10:48:36 EDT Ventricular Rate:  111 PR Interval:   135 QRS Duration: 79 QT Interval:  352 QTC Calculation: 478 R Axis:   33 Text Interpretation:  Sinus tachycardia Abnormal R-wave progression, early  transition No significant change since last tracing 10 Oct 2013 Confirmed  by [facility]  MD-I, Jasmen Emrich ([id number]) on 11/11/2013 11:23:39 AM      MDM   Final diagnoses:  Hypokalemia  Chronic abdominal pain  Nausea and vomiting  Feeding tube dysfunction  Narcotic withdrawal    New Prescriptions   LORAZEPAM (ATIVAN) 1 MG TABLET    Take 1 tablet (1 mg total) by mouth 3 (three) times daily as needed (withdrawal symptoms).   POTASSIUM CHLORIDE SA (K-DUR,KLOR-CON) 20 MEQ TABLET    Take 1 tablet (20 mEq total) by mouth 2 (two) times daily.   PROMETHAZINE (PHENERGAN) 25 MG TABLET    Take 1 tablet (25 mg total) by mouth every 8 (eight) hours as needed for nausea or vomiting.     Plan discharge   Rolland Porter, MD, Alanson Aly, MD 11/11/13 [id number]

## 2013-11-11 NOTE — ED Notes (Signed)
Flushed new PEG tube.

## 2013-11-11 NOTE — ED Notes (Signed)
Pt states she began to have n/v/d last night with abd pain. PT has hx of pancreatitis and gastroparesis. Pt states it feels like pancreatitis. Pt states she also had symptoms last week that resolved. Pt vomiting on arrival, some blood in emesis.

## 2013-11-11 NOTE — ED Notes (Signed)
MD at bedside. 

## 2013-11-11 NOTE — ED Notes (Signed)
Made patient aware that we needed a urine sample she stated she is unable to go at this time

## 2013-11-11 NOTE — Procedures (Signed)
Existing GJ tube has refluxed into esophagus again. See prior reports for details of this chronic problem. Tube removed and replaced with simple 20 Fr balloon retention gastrostomy. Pt had almost immediate relief from intractable N/V Pt needs to follow up with GI specialist at [facility] and would most likely benefit from surgical j-tube.  Ascencion Dike PA-C Interventional Radiology 11/11/2013 12:21 PM

## 2013-11-11 NOTE — Discharge Instructions (Signed)
Start using your new feeding tube. I gave you prescriptions for nausea. He also need to take the potassium pills. He can also take the Ativan for withdrawal symptoms. You will need to contact your pain management doctor to get more of your pain medication. Keep your appointment with Dr. Lianne Cure on April 23. He also have an appointment with Lockie Pares on May 19. Your next GI appointment with Dr. Hessie Dibble is in June  Chronic Pain Discharge Instructions  Emergency care providers appreciate that many patients coming to [place] are in severe pain and we wish to address their pain in the safest, most responsible manner.  It is important to recognize however, that the proper treatment of chronic pain differs from that of the pain of injuries and acute illnesses.  Our goal is to provide quality, safe, personalized care and we thank you for giving [place] the opportunity to serve you. The use of narcotics and related agents for chronic pain syndromes may lead to additional physical and psychological problems.  Nearly as many people die from prescription narcotics each year as die from car crashes.  Additionally, this risk is increased if such prescriptions are obtained from a variety of sources.  Therefore, only your primary care physician or a pain management specialist is able to safely treat such syndromes with narcotic medications long-term.    Documentation revealing such prescriptions have been sought from multiple sources may prohibit [place] from providing a refill or different narcotic medication.  Your name may be checked first through the [place].  This database is a record of controlled substance medication prescriptions that the patient has received.  This has been established by [facility] in an effort to eliminate the dangerous, and often life threatening, practice of obtaining multiple prescriptions from different medical providers.   If you have a chronic pain  syndrome (i.e. chronic headaches, recurrent back or neck pain, dental pain, abdominal or pelvis pain without a specific diagnosis, or neuropathic pain such as fibromyalgia) or recurrent visits for the same condition without an acute diagnosis, you may be treated with non-narcotics and other non-addictive medicines.  Allergic reactions or negative side effects that may be reported by a patient to such medications will not typically lead to the use of a narcotic analgesic or other controlled substance as an alternative.   Patients managing chronic pain with a personal physician should have provisions in place for breakthrough pain.  If you are in crisis, you should call your physician.  If your physician directs you to the emergency department, please have the doctor call and speak to our attending physician concerning your care.   When patients come to the Emergency Department (ED) with acute medical conditions in which the Emergency Department physician feels appropriate to prescribe narcotic or sedating pain medication, the physician will prescribe these in very limited quantities.  The amount of these medications will last only until you can see your primary care physician in his/her office.  Any patient who returns to the ED seeking refills should expect only non-narcotic pain medications.   In the event of an acute medical condition exists and the emergency physician feels it is necessary that the patient be given a narcotic or sedating medication -  a responsible adult driver should be present in the room prior to the medication being given by the nurse.   Prescriptions for narcotic or sedating medications that have been lost, stolen or expired will not be refilled in the  Emergency Department.    Patients who have chronic pain may receive non-narcotic prescriptions until seen by their primary care physician.  It is every patients personal responsibility to maintain active prescriptions with his or  her primary care physician or specialist.

## 2014-07-11 IMAGING — CT CT ABD-PELV W/ CM
1 of 3 series · 13 of 32 positions shown, 18 images · IV contrast (OMNIPAQUE 300)
Comparison: CT abdomen and pelvis 02/26/2012.

CLINICAL DATA: Abdominal pain.  Nausea and vomiting.  Indwelling
jejunal feeding tube.  Surgical history includes cholecystectomy,
hysterectomy, and resection.

CT ABDOMEN AND PELVIS WITH CONTRAST
TECHNIQUE: Multidetector CT imaging of the abdomen and pelvis was
performed following the standard protocol during bolus
administration of intravenous contrast.
Contrast: 100mL OMNIPAQUE IOHEXOL 300 MG/ML IV. Oral contrast was
also administered.

[Series 2: abd/pel with · axial · 0.81mm/px · z∈[+1105,+1540]mm · 13 of 99 slices shown, 18 images]
[im 6/99  soft-tissue]
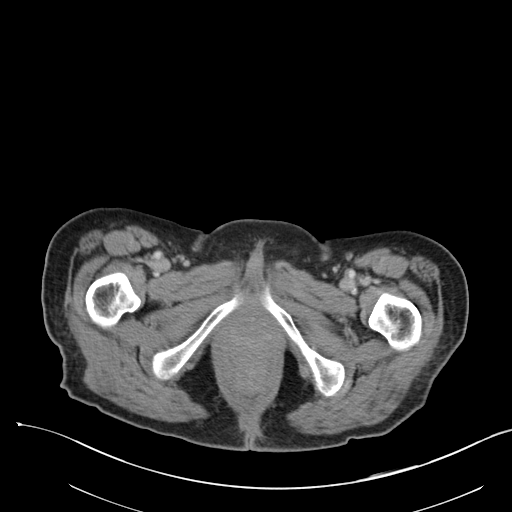
[im 6/99  bone]
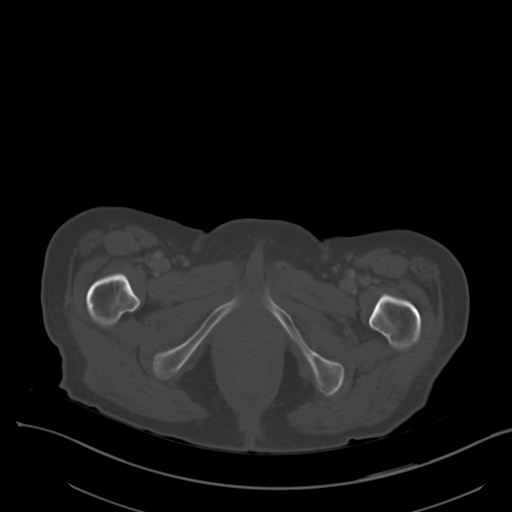
[im 16/99  soft-tissue]
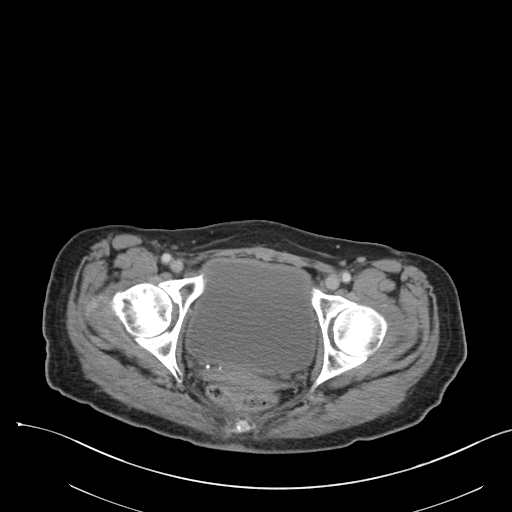
[im 21/99  soft-tissue]
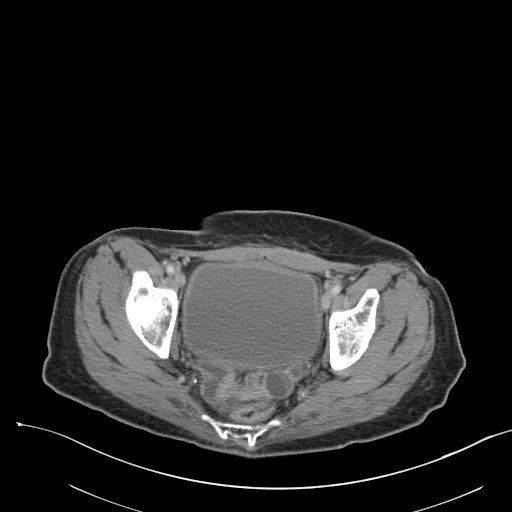
[im 31/99  soft-tissue]
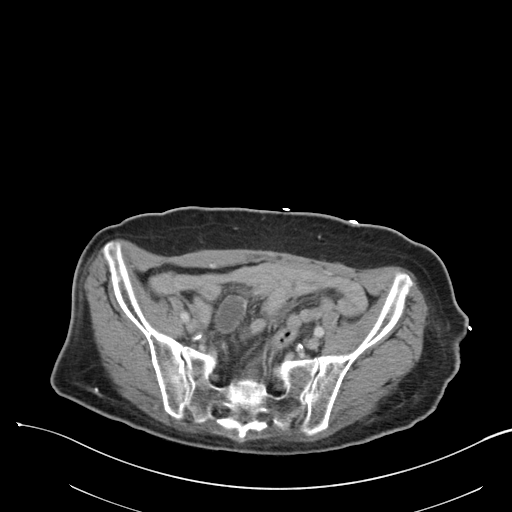
[im 37/99  soft-tissue]
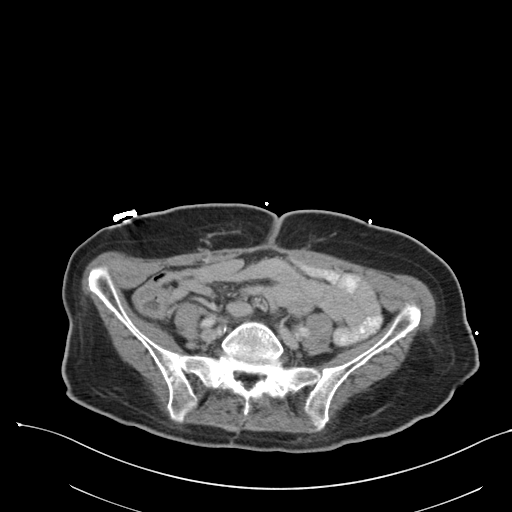
[im 47/99  soft-tissue]
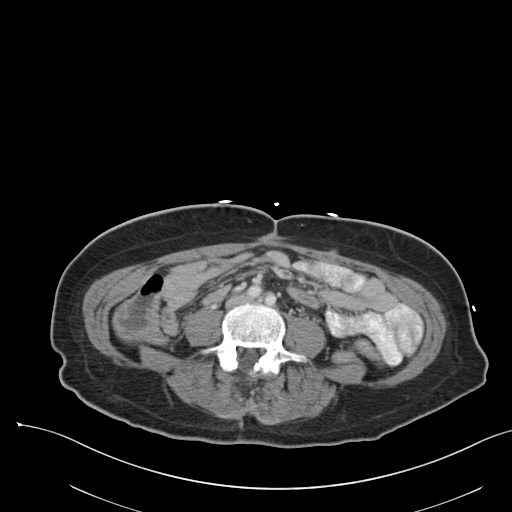
[im 52/99  soft-tissue]
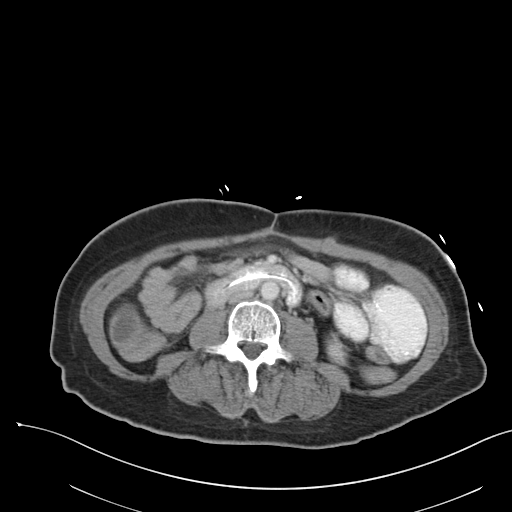
[im 62/99  soft-tissue]
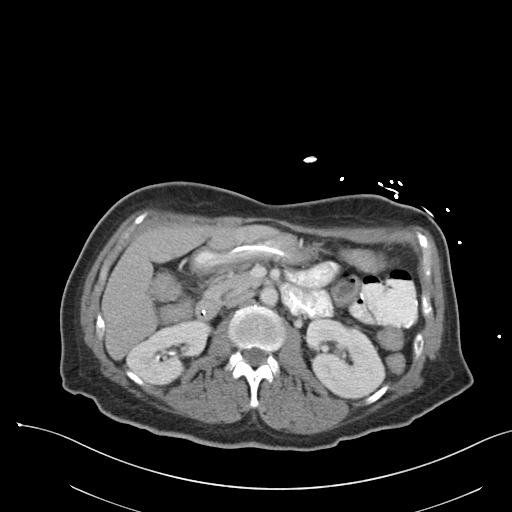
[im 68/99  soft-tissue]
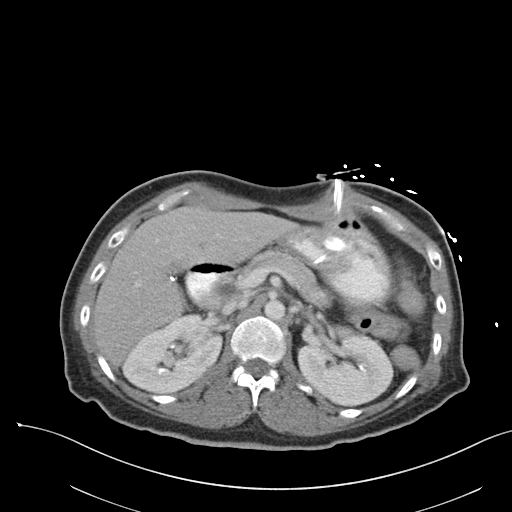
[im 68/99  bone]
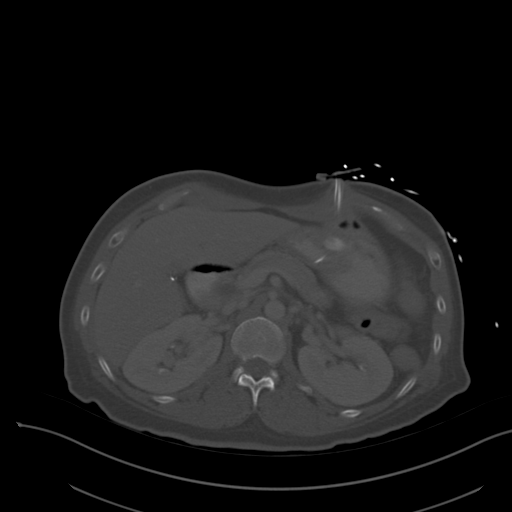
[im 78/99  soft-tissue]
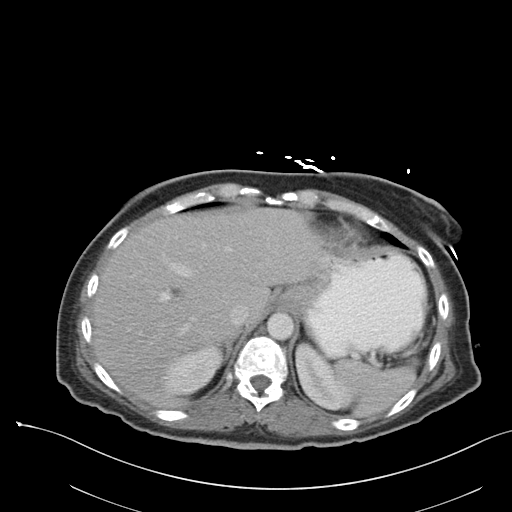
[im 78/99  lung]
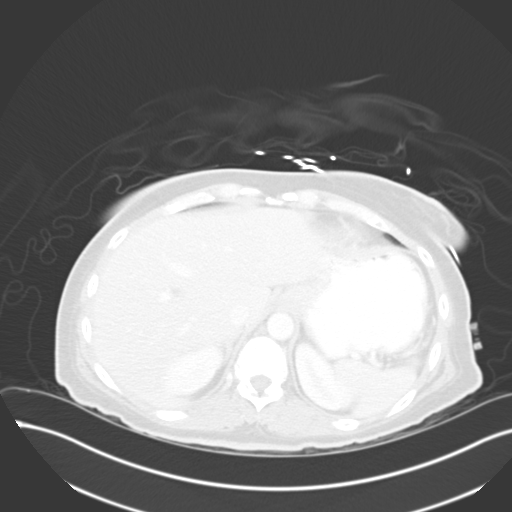
[im 83/99  soft-tissue]
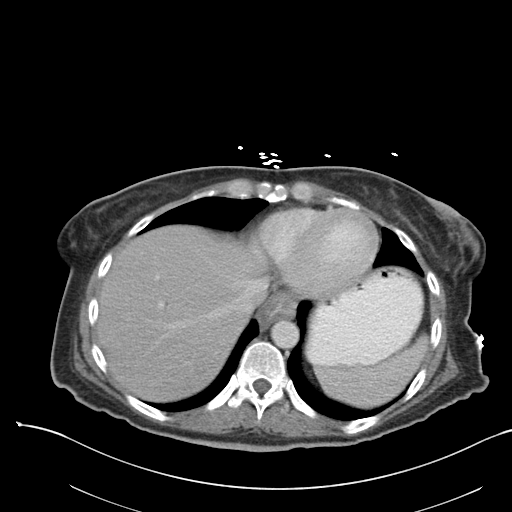
[im 83/99  lung]
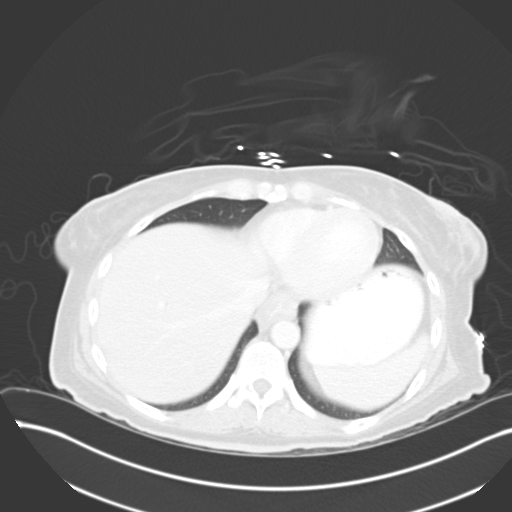
[im 88/99  lung]
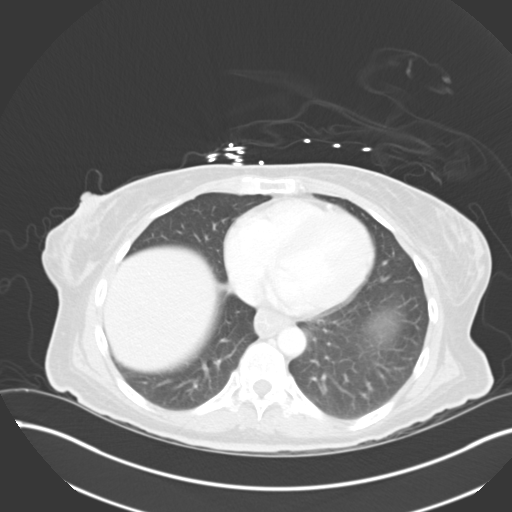
[im 93/99  soft-tissue]
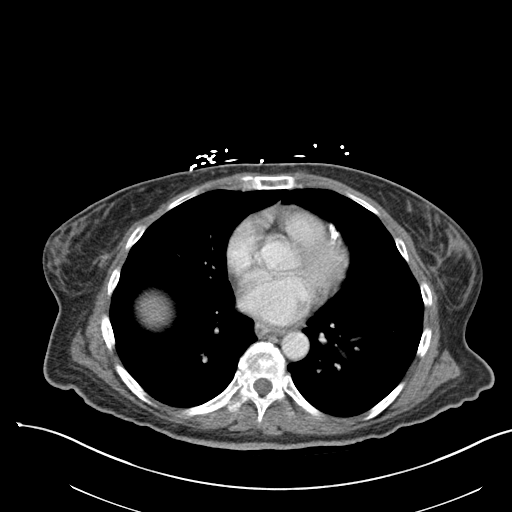
[im 93/99  lung]
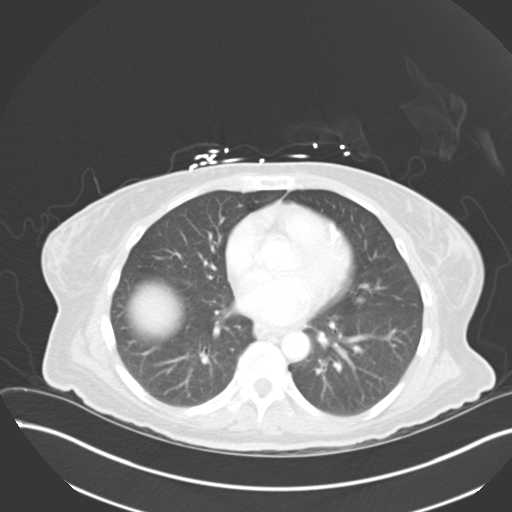

[13 of 32 positions shown; findings below may reference images not displayed]

FINDINGS: Gastrostomy tube in the proximal body of the stomach
without complicating features.  Coaxial small bowel feeding tube is
looped in the fourth portion of the duodenum with its tip back and
the distal third portion of the duodenum.  Circumferential wall
thickening involving the second portion of the duodenum, with
luminal narrowing.  Small bowel anastomosis in the left upper
quadrant is widely patent; mild dilation of this segment is
secondary to the expected post operative atony.  Remaining small
bowel normal in appearance.  Sigmoid colon extends upward into the
left upper quadrant of the abdomen.  Entire colon decompressed and
unremarkable.  Normal appendix in the right upper pelvis.  No
ascites.

Mild diffuse hepatic steatosis without focal hepatic parenchymal
abnormality.  Gallbladder surgically absent, accounting for the
mild intra and extrahepatic biliary ductal dilation.  Normal
appearing spleen, pancreas, adrenal glands, and kidneys.  Moderate
aorto-iliofemoral atherosclerosis.  No significant lymphadenopathy.

Uterus surgically absent.  Urinary bladder with diffuse mild wall
thickening.  Numerous pelvic phleboliths.  No adnexal masses or
free pelvic fluid.

Bone window images demonstrate solid Ray cage fusion at L4-5,
degenerative changes involving the facet joints of the lower lumbar
spine, and a Schmorl's node in the upper endplate of T11.
Visualized lung bases clear.  Heart size normal with apparent prior
LAD and diagonal coronary stents.
IMPRESSION: 1. Circumferential wall thickening involving the second portion of
the duodenum over a long segment, most likely secondary to
duodenitis.  Duodenal carcinoma is possible but felt less likely.
2.  Feeding tube looped in the fourth portion of the duodenum with
its tip back in the third portion of the duodenum.
3.  No evidence of bowel obstruction.
4.  Mild diffuse thickening of the wall of the urinary bladder.
This is nonspecific and can be seen with cystitis or chronic outlet
obstruction.
5.  Mild diffuse hepatic steatosis without focal hepatic
parenchymal abnormality.
6.  Moderate aorto-iliofemoral atherosclerosis which is quite
advanced for age.

## 2015-02-16 IMAGING — CR DG ABDOMEN ACUTE W/ 1V CHEST
3 series · 3 of 3 positions shown · non-contrast
Comparison: Multiple exams, including 07/30/2013

CLINICAL DATA: Abdominal pain.  Nausea and vomiting.

EXAM:
ACUTE ABDOMEN SERIES (ABDOMEN 2 VIEW & CHEST 1 VIEW)

[w chest pa]
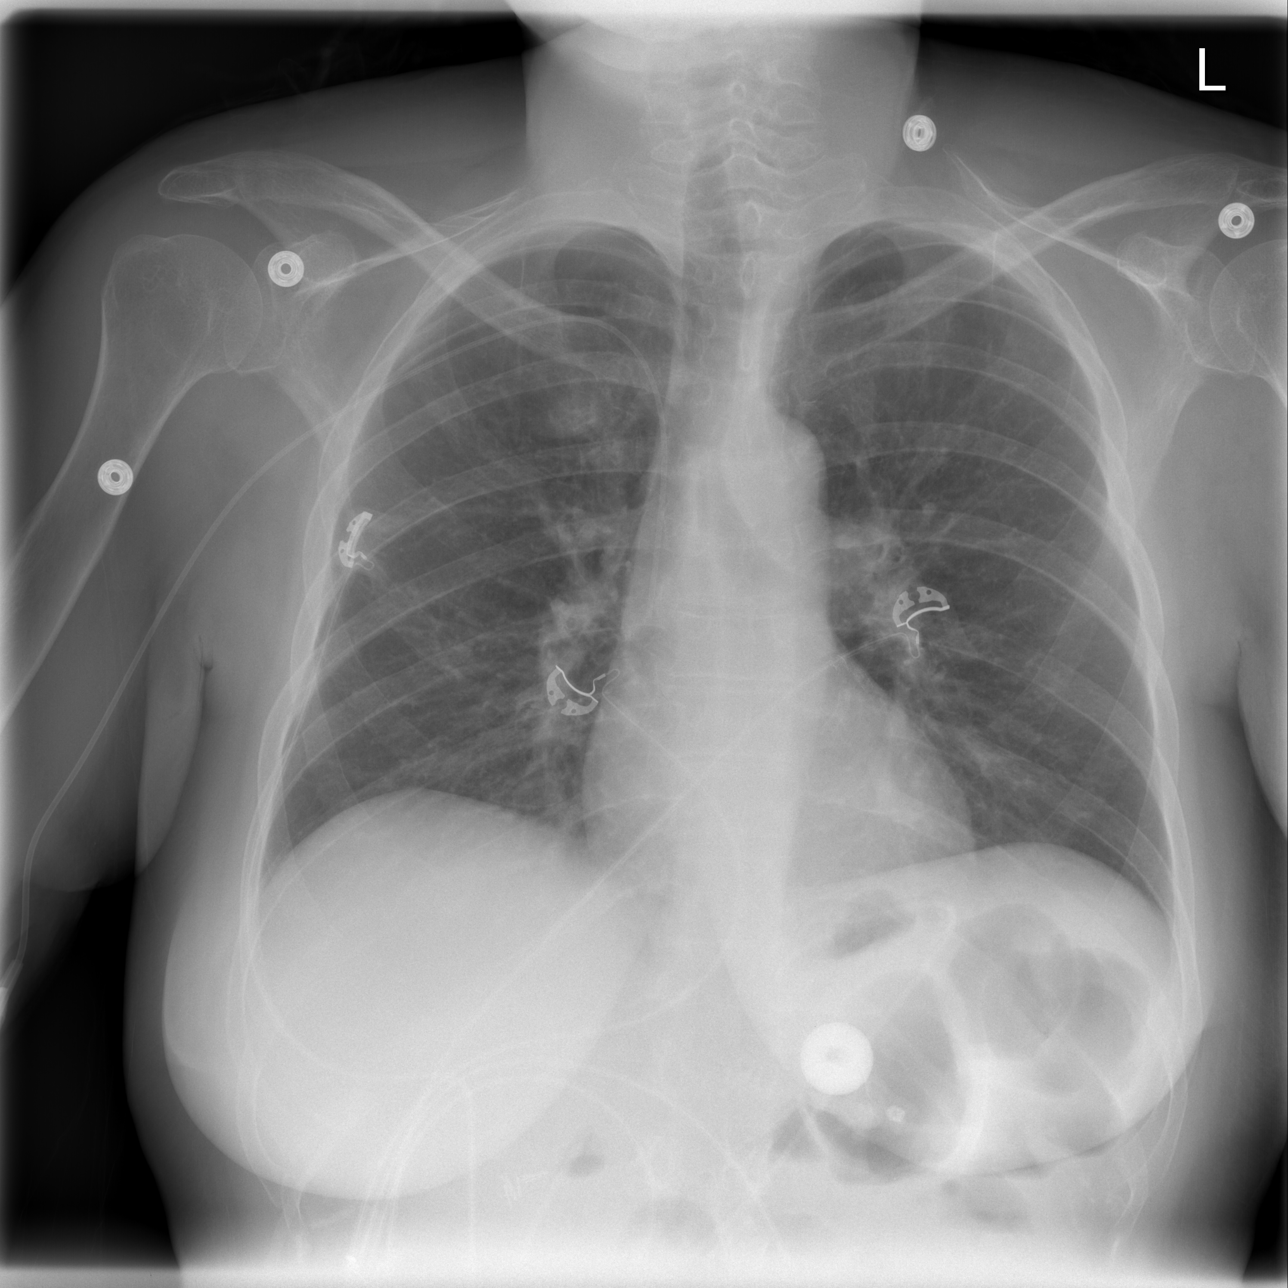

[w abdomen upright *]
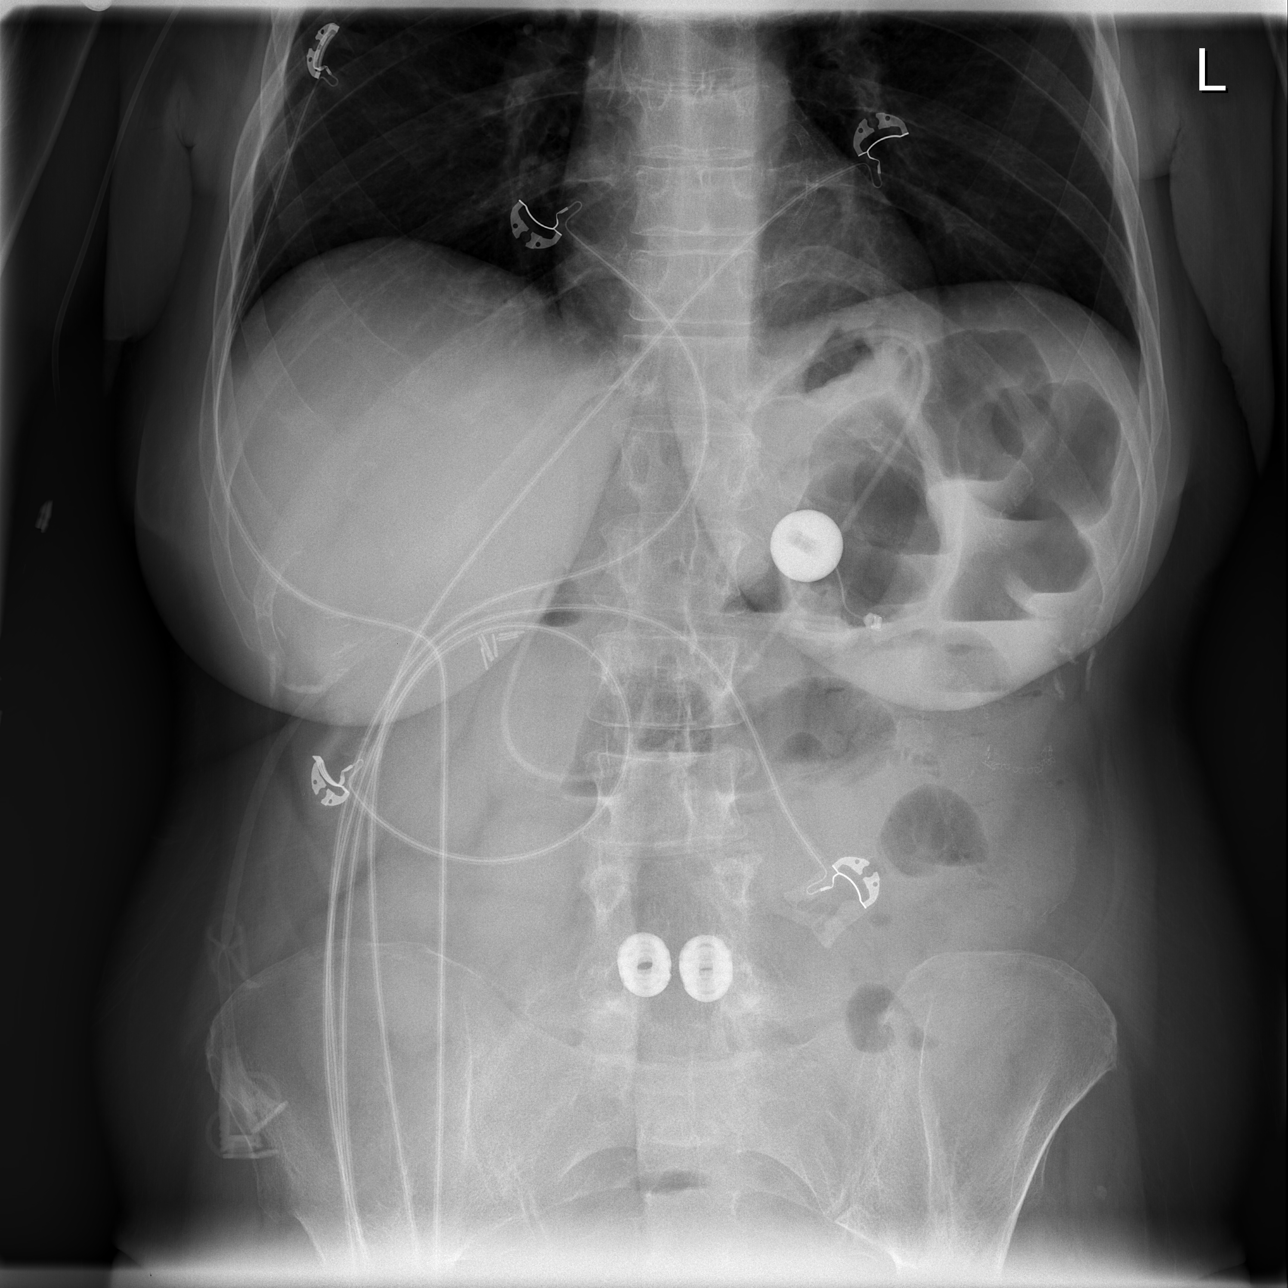

[t abdomen supine]
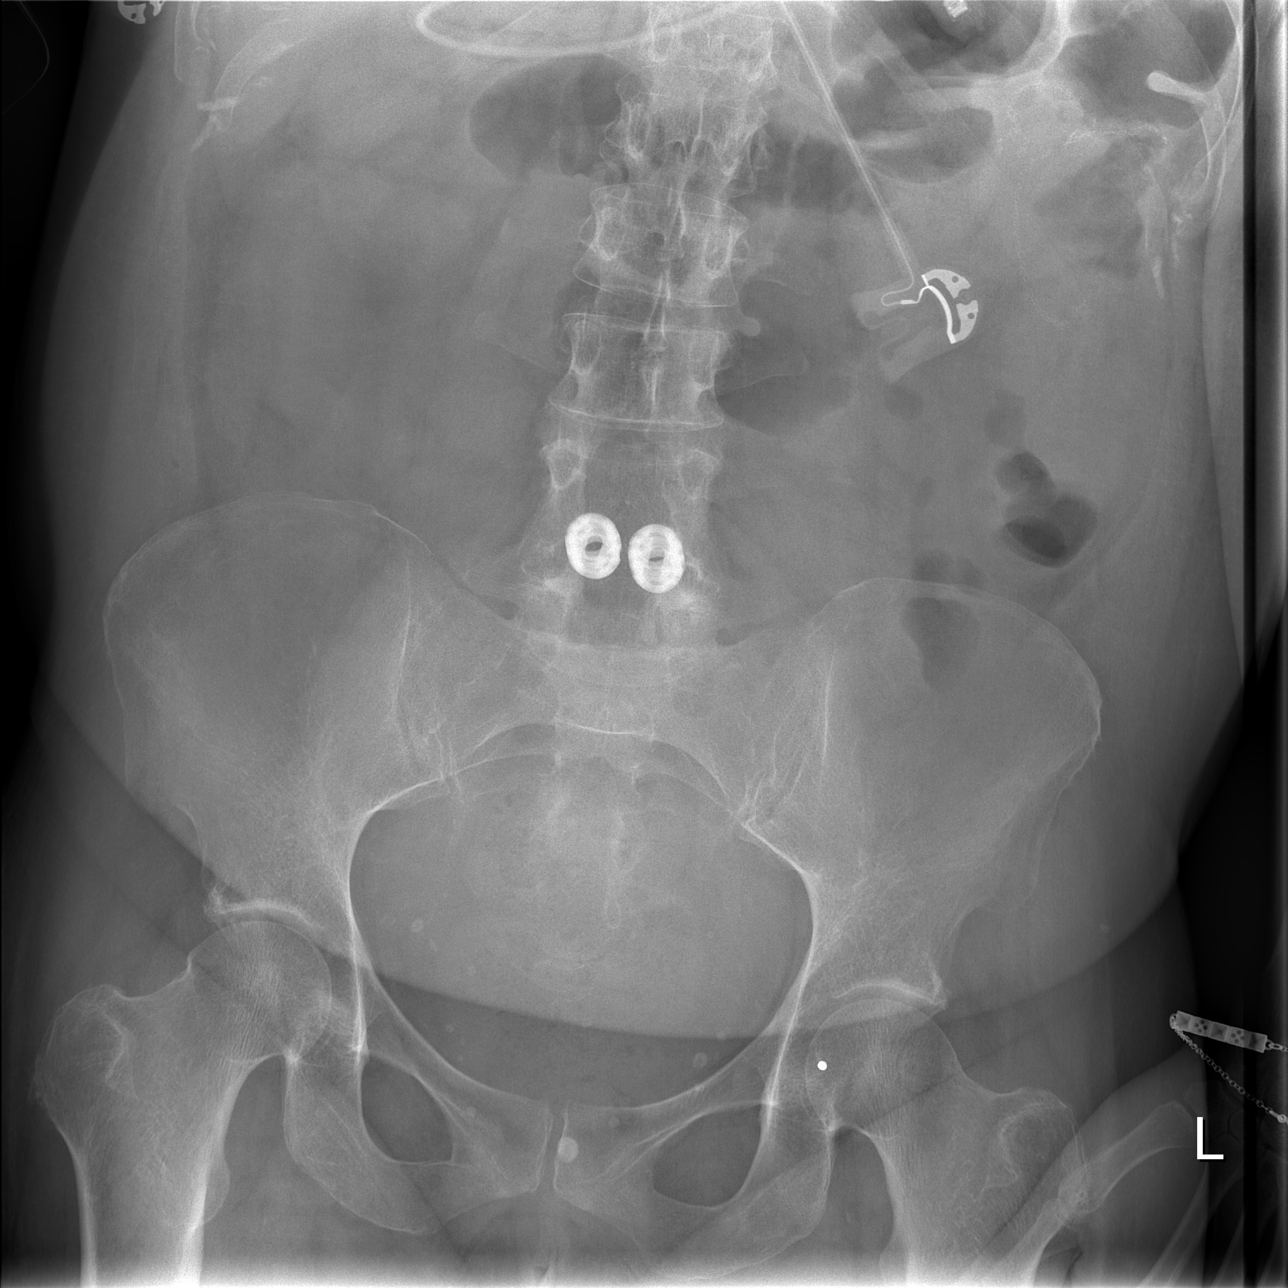

[3 of 3 positions shown; findings below may reference images not displayed]

FINDINGS: Right PICC line tip: SVC. Cardiac and mediastinal margins appear
normal. The lungs appear clear.

No free intraperitoneal gas observed beneath the hemidiaphragms.
There are some air-fluid levels in the proximal descending colon is
and in nondilated left-sided loops of small bowel. The right abdomen
is gasless. Peg tube noted.

Prior low lower lumbar spine scratch have postoperative findings in
the lower lumbar spine.
IMPRESSION: Abnormal but nonspecific bowel gas pattern with air-fluid levels in
the descending colon and in nondilated loops of left-sided small
bowel. Appearance could reflect ileus, diarrheal process, or less
likely orally obstruction. Right abdomen is gasless.

## 2020-10-15 ENCOUNTER — Inpatient Hospital Stay (HOSPITAL_COMMUNITY)
Admission: EM | Admit: 2020-10-15 | Discharge: 2020-10-17 | DRG: 313 | Disposition: A | Discharge status: Other Acute Inpatient Hospital | Payer: Medicare Other | Attending: Internal Medicine | Admitting: Internal Medicine

## 2020-10-15 ENCOUNTER — Emergency Department (HOSPITAL_COMMUNITY): Payer: Medicare Other

## 2020-10-15 ENCOUNTER — Encounter (HOSPITAL_COMMUNITY): Payer: Self-pay | Admitting: Emergency Medicine

## 2020-10-15 DIAGNOSIS — Z91018 Allergy to other foods: Secondary | ICD-10-CM

## 2020-10-15 DIAGNOSIS — R0789 Other chest pain: Secondary | ICD-10-CM | POA: Diagnosis not present

## 2020-10-15 DIAGNOSIS — Z7902 Long term (current) use of antithrombotics/antiplatelets: Secondary | ICD-10-CM

## 2020-10-15 DIAGNOSIS — I129 Hypertensive chronic kidney disease with stage 1 through stage 4 chronic kidney disease, or unspecified chronic kidney disease: Secondary | ICD-10-CM | POA: Diagnosis present

## 2020-10-15 DIAGNOSIS — N184 Chronic kidney disease, stage 4 (severe): Secondary | ICD-10-CM | POA: Diagnosis present

## 2020-10-15 DIAGNOSIS — Z825 Family history of asthma and other chronic lower respiratory diseases: Secondary | ICD-10-CM

## 2020-10-15 DIAGNOSIS — K5909 Other constipation: Secondary | ICD-10-CM | POA: Diagnosis present

## 2020-10-15 DIAGNOSIS — Z20822 Contact with and (suspected) exposure to covid-19: Secondary | ICD-10-CM | POA: Diagnosis present

## 2020-10-15 DIAGNOSIS — E871 Hypo-osmolality and hyponatremia: Secondary | ICD-10-CM | POA: Diagnosis present

## 2020-10-15 DIAGNOSIS — E872 Acidosis: Secondary | ICD-10-CM | POA: Diagnosis present

## 2020-10-15 DIAGNOSIS — R042 Hemoptysis: Secondary | ICD-10-CM | POA: Diagnosis present

## 2020-10-15 DIAGNOSIS — E875 Hyperkalemia: Secondary | ICD-10-CM

## 2020-10-15 DIAGNOSIS — Z794 Long term (current) use of insulin: Secondary | ICD-10-CM

## 2020-10-15 DIAGNOSIS — Z79899 Other long term (current) drug therapy: Secondary | ICD-10-CM

## 2020-10-15 DIAGNOSIS — Z8249 Family history of ischemic heart disease and other diseases of the circulatory system: Secondary | ICD-10-CM

## 2020-10-15 DIAGNOSIS — R079 Chest pain, unspecified: Secondary | ICD-10-CM | POA: Diagnosis present

## 2020-10-15 DIAGNOSIS — Z8049 Family history of malignant neoplasm of other genital organs: Secondary | ICD-10-CM

## 2020-10-15 DIAGNOSIS — E119 Type 2 diabetes mellitus without complications: Secondary | ICD-10-CM

## 2020-10-15 DIAGNOSIS — Z880 Allergy status to penicillin: Secondary | ICD-10-CM

## 2020-10-15 DIAGNOSIS — Z87891 Personal history of nicotine dependence: Secondary | ICD-10-CM

## 2020-10-15 DIAGNOSIS — F32A Depression, unspecified: Secondary | ICD-10-CM | POA: Diagnosis present

## 2020-10-15 DIAGNOSIS — E785 Hyperlipidemia, unspecified: Secondary | ICD-10-CM | POA: Diagnosis present

## 2020-10-15 DIAGNOSIS — Z7982 Long term (current) use of aspirin: Secondary | ICD-10-CM

## 2020-10-15 DIAGNOSIS — E860 Dehydration: Secondary | ICD-10-CM | POA: Diagnosis present

## 2020-10-15 DIAGNOSIS — Z833 Family history of diabetes mellitus: Secondary | ICD-10-CM

## 2020-10-15 DIAGNOSIS — Z91013 Allergy to seafood: Secondary | ICD-10-CM

## 2020-10-15 DIAGNOSIS — N179 Acute kidney failure, unspecified: Secondary | ICD-10-CM

## 2020-10-15 DIAGNOSIS — Z90710 Acquired absence of both cervix and uterus: Secondary | ICD-10-CM

## 2020-10-15 DIAGNOSIS — Z955 Presence of coronary angioplasty implant and graft: Secondary | ICD-10-CM

## 2020-10-15 DIAGNOSIS — I252 Old myocardial infarction: Secondary | ICD-10-CM

## 2020-10-15 DIAGNOSIS — G8929 Other chronic pain: Secondary | ICD-10-CM | POA: Diagnosis present

## 2020-10-15 DIAGNOSIS — Z9049 Acquired absence of other specified parts of digestive tract: Secondary | ICD-10-CM

## 2020-10-15 DIAGNOSIS — I2510 Atherosclerotic heart disease of native coronary artery without angina pectoris: Secondary | ICD-10-CM

## 2020-10-15 DIAGNOSIS — J449 Chronic obstructive pulmonary disease, unspecified: Secondary | ICD-10-CM | POA: Diagnosis present

## 2020-10-15 DIAGNOSIS — K219 Gastro-esophageal reflux disease without esophagitis: Secondary | ICD-10-CM | POA: Diagnosis present

## 2020-10-15 DIAGNOSIS — E1122 Type 2 diabetes mellitus with diabetic chronic kidney disease: Secondary | ICD-10-CM | POA: Diagnosis present

## 2020-10-15 DIAGNOSIS — K861 Other chronic pancreatitis: Secondary | ICD-10-CM | POA: Diagnosis present

## 2020-10-15 DIAGNOSIS — D631 Anemia in chronic kidney disease: Secondary | ICD-10-CM | POA: Diagnosis present

## 2020-10-15 DIAGNOSIS — Z888 Allergy status to other drugs, medicaments and biological substances status: Secondary | ICD-10-CM

## 2020-10-15 LAB — CBC
HCT: 32.3 % — ABNORMAL LOW (ref 36.0–46.0)
Hemoglobin: 9.9 g/dL — ABNORMAL LOW (ref 12.0–15.0)
MCH: 27.1 pg (ref 26.0–34.0)
MCHC: 30.7 g/dL (ref 30.0–36.0)
MCV: 88.5 fL (ref 80.0–100.0)
Platelets: 265 10*3/uL (ref 150–400)
RBC: 3.65 MIL/uL — ABNORMAL LOW (ref 3.87–5.11)
RDW: 14.9 % (ref 11.5–15.5)
WBC: 8.3 10*3/uL (ref 4.0–10.5)
nRBC: 0 % (ref 0.0–0.2)

## 2020-10-15 LAB — MAGNESIUM: Magnesium: 1.7 mg/dL (ref 1.7–2.4)

## 2020-10-15 LAB — TROPONIN I (HIGH SENSITIVITY)
Troponin I (High Sensitivity): 7 ng/L (ref <18)
Troponin I (High Sensitivity): 5 ng/L (ref <18)

## 2020-10-15 LAB — BASIC METABOLIC PANEL
Anion gap: 9 (ref 5–15)
BUN: 53 mg/dL — ABNORMAL HIGH (ref 6–20)
CO2: 19 mmol/L — ABNORMAL LOW (ref 22–32)
Calcium: 8.9 mg/dL (ref 8.9–10.3)
Chloride: 101 mmol/L (ref 98–111)
Creatinine, Ser: 4.03 mg/dL — ABNORMAL HIGH (ref 0.44–1.00)
GFR, Estimated: 13 mL/min — ABNORMAL LOW (ref >60)
Glucose, Bld: 166 mg/dL — ABNORMAL HIGH (ref 70–99)
Potassium: 5.2 mmol/L — ABNORMAL HIGH (ref 3.5–5.1)
Sodium: 129 mmol/L — ABNORMAL LOW (ref 135–145)

## 2020-10-15 MED ORDER — SODIUM CHLORIDE 0.9 % IV BOLUS
500.0000 mL | Freq: Once | INTRAVENOUS | Status: AC
  Administered 2020-10-15: 500 mL via INTRAVENOUS

## 2020-10-15 NOTE — ED Triage Notes (Signed)
Patient BIB [facility] for evaluation of chest pain. 3x SL NTG PTA. No lightheadedness or dizziness. Patient alert, oriented, and in no apparent distress at this time.

## 2020-10-15 NOTE — ED Provider Notes (Signed)
[place] EMERGENCY DEPARTMENT Provider Note   CSN: [id number] Arrival date & time: 10/15/20  1748     History Chief Complaint  Patient presents with   Chest Pain    [name] is a 50 y.o. 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  The patient reports substernal chest pain, characterized as pressure-like, that began suddenly at 1400 while she was laying down watching TV.  Pain radiated down her right arm as well as up to her jaw.  Symptoms are accompanied by diaphoresis and feeling very clammy.  Pain and associated symptoms remained constant until she was given ASA and NTG in route, which completely resolved her pain.  She states that pain felt similar to previous MIs.  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  She is followed by cardiology at [facility].  She also endorses shortness of breath, onset yesterday, accompanied by cough and intermittent hemoptysis.  She states that she has a history of a clotting disorder that "makes her blood clot easier", but is unsure of her diagnosis.  She denies a history of prior DVTs or PEs.  She is currently on Plavix, but no other anticoagulation.  She also endorses nausea, but states that this is chronic secondary to gastroparesis.  No vomiting, dizziness, lightheadedness, blurred vision, abdominal pain, urinary retention, dysuria, headache, fever, chills, leg swelling, orthopnea.   Per chart review on 04/26/18 from cardiology note: "Cardiac catheterization and PCI 08/24/2015: Coronary Angiography: LMCA: Normal LAD: Large, wraparound vessel supplying most of the inferior wall. Patent proximal and mid LAD stents with mild restenosis up to 20%. Diffuse distal LAD disease with focal stenosis of 80%.  Following PCI, there is <10% residual stenosis. One large first diagonal and small second diagonal branches are present. LCX: Moderate to large-caliber, dominant vessel with mild luminal irregularities. First obtuse marginal branch is small to moderate-caliber with a 90% proximal stenosis. Second obtuse marginal is a tiny branch. There are several small posterolateral branches. RCA: Small, non-dominant vessel with diffuse disease. GRAFTS: none Left Ventriculogram: Normal left ventricular size and contraction (LVEF 65-70%). Left ventricular filling pressure is elevated (LVEDP 30 mmHg). No significant mitral regurgitation or aortic stenosis. Successful PCI to the distal LAD with placement of a Xience Alpine 2.25 x 18 mm drug eluting stent with good angiographic result."  Of note, patient is currently incarcerated, but due to her longstanding, chronic medical conditions patient is currently placed at [facility] with 24/7 officer supervision.  The history is provided by the patient and medical records. No language interpreter was used.       Past Medical History:  Diagnosis Date   Chronic pancreatitis (HCC)    COPD (chronic obstructive pulmonary disease) (HCC)    Coronary artery disease    Diabetes mellitus type 1 with neurological manifestations (HCC)    Diabetic neuropathy, painful ([place])    Diverticulosis    s/p partial bowel resection   Dyslipidemia    Gastroparesis due to DM (HCC)    GERD (gastroesophageal reflux disease)    Hypertension    MI (myocardial infarction) ([place])    Seizures ([place])     Patient Active Problem List   Diagnosis Date Noted   Chest pain 10/15/2020   Vomiting 10/26/2013   Chronic abdominal pain 10/26/2013  Diabetic gastroparesis ([place]) 08/13/2013   Hemoptysis 08/03/2013   Drug-seeking behavior 08/02/2013   Narcotic withdrawal ([place]) 08/01/2013   Nausea & vomiting 07/30/2013   Epigastric pain 05/27/2013   Leukocytosis  05/27/2013   Hyperglycemia 05/27/2013   Hypochloremia 05/27/2013   Protein-calorie malnutrition, severe ([place]) 05/01/2013   Attention to gastrostomy tube ([place]) 04/11/2013   Dysphasia 02/17/2013   History of solitary pulmonary nodule 02/17/2013   Diabetic neuropathy ([place]) 02/17/2013   Hematemesis 02/06/2013   Abdominal pain 01/19/2013   Acute renal failure ([place]) 12/26/2012   Hyponatremia 12/24/2012   Leukocytosis, unspecified 12/24/2012   Hypomagnesemia 02/28/2012   Ileus ([place]) 02/25/2012   Dehydration 02/25/2012   UTI (lower urinary tract infection) 02/09/2012   Hypokalemia 02/09/2012   Constipation 12/01/2011   DM (diabetes mellitus), type 2, uncontrolled ([place]) 08/09/2011   Intractable nausea and vomiting 06/10/2011   Dyslipidemia 06/10/2011   GERD (gastroesophageal reflux disease) 06/10/2011   Gastroparesis    Hypertension     Past Surgical History:  Procedure Laterality Date   ABDOMINAL HYSTERECTOMY     BACK SURGERY     BALLOON DILATION N/A 02/19/2013   Procedure: BALLOON DILATION;  Surgeon: Wonda Horner, MD;  Location: [facility] ENDOSCOPY;  Service: Endoscopy;  Laterality: N/A;   CESAREAN SECTION     CHOLECYSTECTOMY     colon resection due to diverticulitis     CORONARY STENT PLACEMENT     2 stents [place]   ESOPHAGOGASTRODUODENOSCOPY N/A 12/27/2012   Procedure: ESOPHAGOGASTRODUODENOSCOPY (EGD);  Surgeon: Missy Sabins, MD;  Location: [facility];  Service: Endoscopy;  Laterality: N/A;   ESOPHAGOGASTRODUODENOSCOPY N/A 09/13/2013   Procedure: ESOPHAGOGASTRODUODENOSCOPY (EGD);  Surgeon: Cleotis Nipper, MD;  Location: [facility];  Service: Endoscopy;  Laterality: N/A;   ESOPHAGOGASTRODUODENOSCOPY (EGD) WITH ESOPHAGEAL DILATION N/A 02/19/2013   Procedure: ESOPHAGOGASTRODUODENOSCOPY (EGD) WITH ESOPHAGEAL DILATION;  Surgeon: Wonda Horner, MD;  Location: [facility];  Service: Endoscopy;  Laterality: N/A;   peg tube x 4     feeding  jejunostomies with PEG tubes     OB History   No obstetric history on file.     Family History  Problem Relation Age of Onset   Endometrial cancer Mother    Diabetes Mellitus II Maternal Grandmother    Diabetes Mellitus II Mother    Diabetes Mellitus II Sister    Emphysema Mother    Heart attack Father     Social History   Tobacco Use   Smoking status: Former Smoker    Packs/day: 0.25    Years: 6.00    Pack years: 1.50    Types: Cigarettes, Cigars    Quit date: 08/01/2013    Years since quitting: 7.2   Smokeless tobacco: Never Used  Substance Use Topics   Alcohol use: No   Drug use: No    Home Medications Prior to Admission medications   Medication Sig Start Date End Date Taking? Authorizing Provider  albuterol (VENTOLIN HFA) 108 (90 Base) MCG/ACT inhaler Inhale 1 puff into the lungs every 8 (eight) hours as needed for wheezing or shortness of breath.   Yes [provider]  amitriptyline (ELAVIL) 100 MG tablet Take 100 mg by mouth at bedtime.   Yes [provider]  amLODipine (NORVASC) 10 MG tablet Take 10 mg by mouth daily.   Yes [provider]  aspirin EC 81 MG tablet Take 81 mg by mouth daily. Swallow whole.   Yes [provider]  atorvastatin (LIPITOR) 40 MG tablet Take  40 mg by mouth at bedtime.   Yes [provider]  busPIRone (BUSPAR) 15 MG tablet Take 15 mg by mouth 2 (two) times daily.   Yes [provider]  calcitRIOL (ROCALTROL) 0.25 MCG capsule Take 0.25 mcg by mouth daily.   Yes [provider]  clopidogrel (PLAVIX) 75 MG tablet Take 75 mg by mouth daily.   Yes [provider]  Darbepoetin Alfa 40 MCG/ML SOLN Inject 40 mcg as directed once a week.   Yes [provider]  diphenhydrAMINE (BENADRYL) 25 MG tablet Take 25 mg by mouth every 6 (six) hours as needed for itching or sleep.   Yes [provider]  docusate sodium (COLACE) 100 MG capsule Take 200 mg by  mouth at bedtime.   Yes [provider]  ezetimibe (ZETIA) 10 MG tablet Take 10 mg by mouth daily.   Yes [provider]  ferrous sulfate 325 (65 FE) MG tablet Take 325 mg by mouth daily with breakfast.   Yes [provider]  hydrALAZINE (APRESOLINE) 50 MG tablet Take 100 mg by mouth every 8 (eight) hours.   Yes [provider]  insulin lispro (HUMALOG) 100 UNIT/ML injection Inject 3-5 Units into the skin See admin instructions. Per sliding scale before meals and at bedtime <150-0 units, 151-200= 1 unit, 201-250=2 units,251-300=3 units, 301-350= 4 units, 351-400=5 units. >400  or <70 call md   Yes [provider]  isosorbide mononitrate (IMDUR) 60 MG 24 hr tablet Take 60 mg by mouth daily.   Yes [provider]  melatonin 3 MG TABS tablet Take 3 mg by mouth at bedtime as needed (sleep).   Yes [provider]  metoprolol succinate (TOPROL-XL) 50 MG 24 hr tablet Take 50 mg by mouth daily. Take with or immediately following a meal.   Yes [provider]  mometasone-formoterol (DULERA) 100-5 MCG/ACT AERO Inhale 2 puffs into the lungs 2 (two) times daily.   Yes [provider]  nitroGLYCERIN (NITROSTAT) 0.4 MG SL tablet Place 0.4 mg under the tongue every 5 (five) minutes as needed for chest pain.   Yes [provider]  oxyCODONE (OXY IR/ROXICODONE) 5 MG immediate release tablet Take 5 mg by mouth every 4 (four) hours as needed for severe pain.   Yes [provider]  pantoprazole (PROTONIX) 40 MG tablet Take 40 mg by mouth daily.   Yes [provider]  polyethylene glycol powder (GLYCOLAX/MIRALAX) 17 GM/SCOOP powder Take 17 g by mouth daily.   Yes [provider]  sertraline (ZOLOFT) 100 MG tablet Take 100 mg by mouth daily.   Yes [provider]  Skin Protectants, Misc. (EUCERIN) cream Apply 1 application topically daily.   Yes [provider]  sodium bicarbonate 650 MG  tablet Take 1,300 mg by mouth 2 (two) times daily.   Yes [provider]  SODIUM CHLORIDE IN Place 2 sprays into both nostrils every 4 (four) hours as needed (congestion).   Yes [provider]  insulin glargine (LANTUS) 100 UNIT/ML injection Inject 0.1 mLs (10 Units total) into the skin at bedtime. Patient taking differently: Inject 24 Units into the skin at bedtime. 10/30/13   Kinnie Feil, MD    Allergies    Fish allergy, Other, Lidocaine, Acetaminophen, Cefadroxil, Compazine [prochlorperazine edisylate], Darvocet [propoxyphene n-acetaminophen], Zofran [ondansetron hcl], Bromfenac, Ketorolac, Nsaids, and Penicillins  Review of Systems   Review of Systems  Constitutional: Positive for diaphoresis. Negative for activity change, chills and fever.  HENT: Negative for congestion and sore throat.   Eyes: Negative for visual disturbance.  Respiratory: Positive for cough and shortness of breath. Negative for wheezing.        Hemoptysis  Cardiovascular: Positive for chest pain. Negative for palpitations and leg swelling.  Gastrointestinal: Positive for nausea (chronic). Negative for abdominal pain, blood in stool, constipation, diarrhea and vomiting.  Endocrine: Negative for polyuria.  Genitourinary: Negative for difficulty urinating and dysuria.  Musculoskeletal: Negative for arthralgias, back pain, gait problem, joint swelling, myalgias, neck pain and neck stiffness.  Skin: Negative for rash.  Allergic/Immunologic: Negative for immunocompromised state.  Neurological: Negative for dizziness, seizures, syncope, speech difficulty, weakness, numbness and headaches.  Psychiatric/Behavioral: Negative for confusion.    Physical Exam Updated Vital Signs BP (!) 148/86    Pulse 75    Temp 98.4 F (36.9 C) (Oral)    Resp 15    Ht '5\' 4"'$  (1.626 m)    Wt 81.6 kg    SpO2 96%    BMI 30.90 kg/m   Physical Exam Vitals and nursing note reviewed.  Constitutional:      General: She is  not in acute distress.    Appearance: She is not ill-appearing, toxic-appearing or diaphoretic.  HENT:     Head: Normocephalic.     Nose: Nose normal.     Mouth/Throat:     Mouth: Mucous membranes are moist.     Pharynx: No oropharyngeal exudate or posterior oropharyngeal erythema.  Eyes:     Conjunctiva/sclera: Conjunctivae normal.  Cardiovascular:     Rate and Rhythm: Normal rate and regular rhythm.     Heart sounds: Normal heart sounds. No murmur heard. No friction rub. No gallop.   Pulmonary:     Effort: Pulmonary effort is normal. No respiratory distress.     Comments: Lungs are clear to auscultation bilaterally.  No increased work of breathing.  Able to speak in complete, fluent sentences. Abdominal:     General: There is no distension.     Palpations: Abdomen is soft.     Tenderness: There is no abdominal tenderness. There is no guarding or rebound.  Musculoskeletal:     Cervical back: Neck supple.     Right lower leg: No edema.     Left lower leg: No edema.  Skin:    General: Skin is warm.     Capillary Refill: Capillary refill takes 2 to 3 seconds.     Findings: No rash.  Neurological:     Mental Status: She is alert.  Psychiatric:        Behavior: Behavior normal.     ED Results / Procedures / Treatments   Labs (all labs ordered are listed, but only abnormal results are displayed) Labs Reviewed  BASIC METABOLIC PANEL - Abnormal; Notable for the following components:      Result Value   Sodium 129 (*)    Potassium 5.2 (*)    CO2 19 (*)    Glucose, Bld 166 (*)    BUN 53 (*)    Creatinine, Ser 4.03 (*)    GFR, Estimated 13 (*)    All other components within normal limits  CBC - Abnormal; Notable for the following components:   RBC 3.65 (*)    Hemoglobin 9.9 (*)    HCT 32.3 (*)    All other components within normal limits  RESP PANEL BY RT-PCR (FLU A&B, COVID) ARPGX2  MAGNESIUM  URINALYSIS, ROUTINE W REFLEX MICROSCOPIC  TROPONIN I (HIGH  SENSITIVITY)   TROPONIN I (HIGH SENSITIVITY)    EKG EKG Interpretation  Date/Time:  Thursday October 15 2020 17:54:07 EDT Ventricular Rate:  82 PR Interval:  158 QRS Duration: 84 QT Interval:  374 QTC Calculation: 436 R Axis:   -11 Text Interpretation: Normal sinus rhythm Cannot rule out Anterior infarct , age undetermined Abnormal ECG Confirmed by Lennice Sites [phone]) on 10/15/2020 9:54:04 PM   Radiology DG Chest 2 View  Result Date: 10/15/2020 CLINICAL DATA:  Chest pain. EXAM: CHEST - 2 VIEW COMPARISON:  November 11, 2013. FINDINGS: The heart size and mediastinal contours are within normal limits. No pneumothorax or pleural effusion is noted. Hypoinflation of the lungs is noted with mild bibasilar subsegmental atelectasis. The visualized skeletal structures are unremarkable. IMPRESSION: Hypoinflation of the lungs with mild bibasilar subsegmental atelectasis. Electronically Signed   By: Marijo Conception M.D.   On: 10/15/2020 18:24    Procedures Procedures   Medications Ordered in ED Medications  acetaminophen (TYLENOL) tablet 650 mg (650 mg Oral Not Given 10/16/20 0115)  sodium chloride 0.9 % bolus 500 mL (0 mLs Intravenous Stopped 10/16/20 0043)    ED Course  I have reviewed the triage vital signs and the nursing notes.  Pertinent labs & imaging results that were available during my care of the patient were reviewed by me and considered in my medical decision making (see chart for details).    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  Chest pain that radiated to right arm and jaw was accompanied by diaphoresis and clamminess.  She has also had shortness of breath for the  last 2 days and endorses small-volume hemoptysis.  Symptoms entirely resolved after she was given nitroglycerin and ASA in route.  Vital signs are stable.  Patient was discussed with Dr. Ronnald Nian, attending physician.  Labs and imaging have been reviewed and independently interpreted by me.  EKG with NSR.  Cannot rule out anterior infarct, age undetermined.   Initial troponin is 7.  Repeat troponin is pending.  Patient's Cr is 4.03 today (up from 1.69 in Sept. 2020 at [facility]).  Despite significant increase in creatinine, initial troponin is not elevated.  She also has mild hyponatremia at 129 and mild hyperkalemia at 5.2.  Bicarb is 19 and I suspect this may be secondary to dehydration.  Will give patient 500 cc fluid bolus given her history of CHF.  Doubt CHF exacerbation, aortic dissection, PE, pneumonia, or tension pneumothorax.   Could consider PE with reported history of hemoptysis, but could also consider bronchitis given mild hyperinflation noted on chest x-ray.  Lungs are clear to auscultation bilaterally on my exam.  Consult to the hospitalist team and Dr. Marlowe Sax will accept the patient for admission for AKI.  She would likely benefit from cardiology consult during admission.  The patient appears reasonably stabilized for admission considering the current resources, flow, and capabilities available in the ED at this time, and I doubt any other [facility] requiring further screening and/or treatment in the ED prior to admission.    Final Clinical Impression(s) / ED Diagnoses Final diagnoses:  AKI (acute  kidney injury) ([place])  Chest pain, unspecified type  Hyperkalemia  Hyponatremia    Rx / DC Orders ED Discharge Orders    None       Aydenn Gervin A, PA-C 10/16/20 0118    Lennice Sites, DO 10/16/20 1036

## 2020-10-16 ENCOUNTER — Observation Stay (HOSPITAL_COMMUNITY): Payer: Medicare Other

## 2020-10-16 ENCOUNTER — Other Ambulatory Visit: Payer: Self-pay

## 2020-10-16 DIAGNOSIS — I2510 Atherosclerotic heart disease of native coronary artery without angina pectoris: Secondary | ICD-10-CM

## 2020-10-16 DIAGNOSIS — N179 Acute kidney failure, unspecified: Secondary | ICD-10-CM

## 2020-10-16 DIAGNOSIS — R079 Chest pain, unspecified: Secondary | ICD-10-CM

## 2020-10-16 DIAGNOSIS — E119 Type 2 diabetes mellitus without complications: Secondary | ICD-10-CM

## 2020-10-16 LAB — GLUCOSE, CAPILLARY
Glucose-Capillary: 86 mg/dL (ref 70–99)
Glucose-Capillary: 218 mg/dL — ABNORMAL HIGH (ref 70–99)
Glucose-Capillary: 312 mg/dL — ABNORMAL HIGH (ref 70–99)
Glucose-Capillary: 147 mg/dL — ABNORMAL HIGH (ref 70–99)
Glucose-Capillary: 136 mg/dL — ABNORMAL HIGH (ref 70–99)
Glucose-Capillary: 132 mg/dL — ABNORMAL HIGH (ref 70–99)

## 2020-10-16 LAB — MRSA PCR SCREENING: MRSA by PCR: NEGATIVE

## 2020-10-16 LAB — RESP PANEL BY RT-PCR (FLU A&B, COVID) ARPGX2
Influenza A by PCR: NEGATIVE
Influenza B by PCR: NEGATIVE
SARS Coronavirus 2 by RT PCR: NEGATIVE

## 2020-10-16 LAB — CREATININE, URINE, RANDOM: Creatinine, Urine: 42.1 mg/dL

## 2020-10-16 MED ORDER — BUSPIRONE HCL 15 MG PO TABS
15.0000 mg | ORAL_TABLET | Freq: Two times a day (BID) | ORAL | Status: DC
  Administered 2020-10-16 – 2020-10-17 (×4): 15 mg via ORAL
  Filled 2020-10-16 (×4): qty 1

## 2020-10-16 MED ORDER — AMLODIPINE BESYLATE 10 MG PO TABS
10.0000 mg | ORAL_TABLET | Freq: Every day | ORAL | Status: DC
  Administered 2020-10-16 – 2020-10-17 (×2): 10 mg via ORAL
  Filled 2020-10-16 (×2): qty 1

## 2020-10-16 MED ORDER — DOCUSATE SODIUM 100 MG PO CAPS
200.0000 mg | ORAL_CAPSULE | Freq: Every day | ORAL | Status: DC
  Administered 2020-10-16 (×2): 200 mg via ORAL
  Filled 2020-10-16 (×3): qty 2

## 2020-10-16 MED ORDER — PANTOPRAZOLE SODIUM 40 MG PO TBEC
40.0000 mg | DELAYED_RELEASE_TABLET | Freq: Every day | ORAL | Status: DC
Start: 2020-10-16 — End: 2020-10-17
  Administered 2020-10-16 – 2020-10-17 (×2): 40 mg via ORAL
  Filled 2020-10-16 (×2): qty 1

## 2020-10-16 MED ORDER — ISOSORBIDE MONONITRATE ER 60 MG PO TB24
60.0000 mg | ORAL_TABLET | Freq: Every day | ORAL | Status: DC
  Administered 2020-10-16 – 2020-10-17 (×2): 60 mg via ORAL
  Filled 2020-10-16 (×2): qty 1

## 2020-10-16 MED ORDER — ATORVASTATIN CALCIUM 40 MG PO TABS
40.0000 mg | ORAL_TABLET | Freq: Every day | ORAL | Status: DC
  Administered 2020-10-16 (×2): 40 mg via ORAL
  Filled 2020-10-16 (×2): qty 1

## 2020-10-16 MED ORDER — CALCITRIOL 0.25 MCG PO CAPS
0.2500 ug | ORAL_CAPSULE | Freq: Every day | ORAL | Status: DC
  Administered 2020-10-16 – 2020-10-17 (×2): 0.25 ug via ORAL
  Filled 2020-10-16 (×2): qty 1

## 2020-10-16 MED ORDER — ALBUTEROL SULFATE HFA 108 (90 BASE) MCG/ACT IN AERS: 1.0000 | INHALATION_SPRAY | Freq: Three times a day (TID) | RESPIRATORY_TRACT | Status: DC | PRN

## 2020-10-16 MED ORDER — SODIUM CHLORIDE 0.9 % IV SOLN: INTRAVENOUS | Status: DC

## 2020-10-16 MED ORDER — INSULIN ASPART 100 UNIT/ML ~~LOC~~ SOLN
0.0000 [IU] | Freq: Three times a day (TID) | SUBCUTANEOUS | Status: DC
  Administered 2020-10-16: 2 [IU] via SUBCUTANEOUS
  Administered 2020-10-16: 11 [IU] via SUBCUTANEOUS
  Administered 2020-10-16 – 2020-10-17 (×2): 2 [IU] via SUBCUTANEOUS
  Administered 2020-10-17: 3 [IU] via SUBCUTANEOUS

## 2020-10-16 MED ORDER — AMITRIPTYLINE HCL 50 MG PO TABS
100.0000 mg | ORAL_TABLET | Freq: Every day | ORAL | Status: DC
  Administered 2020-10-16 (×2): 100 mg via ORAL
  Filled 2020-10-16 (×2): qty 2

## 2020-10-16 MED ORDER — NITROGLYCERIN 0.4 MG SL SUBL: 0.4000 mg | SUBLINGUAL_TABLET | SUBLINGUAL | Status: DC | PRN

## 2020-10-16 MED ORDER — CLOPIDOGREL BISULFATE 75 MG PO TABS
75.0000 mg | ORAL_TABLET | Freq: Every day | ORAL | Status: DC
  Administered 2020-10-16 – 2020-10-17 (×2): 75 mg via ORAL
  Filled 2020-10-16 (×2): qty 1

## 2020-10-16 MED ORDER — INSULIN ASPART 100 UNIT/ML ~~LOC~~ SOLN: 0.0000 [IU] | Freq: Every day | SUBCUTANEOUS | Status: DC

## 2020-10-16 MED ORDER — SERTRALINE HCL 100 MG PO TABS
100.0000 mg | ORAL_TABLET | Freq: Every day | ORAL | Status: DC
  Administered 2020-10-16 – 2020-10-17 (×2): 100 mg via ORAL
  Filled 2020-10-16 (×2): qty 1

## 2020-10-16 MED ORDER — POLYETHYLENE GLYCOL 3350 17 G PO PACK
17.0000 g | PACK | Freq: Every day | ORAL | Status: DC
  Filled 2020-10-16 (×2): qty 1

## 2020-10-16 MED ORDER — HYDRALAZINE HCL 50 MG PO TABS
100.0000 mg | ORAL_TABLET | Freq: Three times a day (TID) | ORAL | Status: DC
  Administered 2020-10-16 – 2020-10-17 (×5): 100 mg via ORAL
  Filled 2020-10-16 (×5): qty 2

## 2020-10-16 MED ORDER — ACETAMINOPHEN 325 MG PO TABS
650.0000 mg | ORAL_TABLET | Freq: Once | ORAL | Status: DC
  Filled 2020-10-16: qty 2

## 2020-10-16 MED ORDER — FERROUS SULFATE 325 (65 FE) MG PO TABS
325.0000 mg | ORAL_TABLET | Freq: Every day | ORAL | Status: DC
  Administered 2020-10-16 – 2020-10-17 (×2): 325 mg via ORAL
  Filled 2020-10-16 (×2): qty 1

## 2020-10-16 MED ORDER — CLOPIDOGREL BISULFATE 75 MG PO TABS: 75.0000 mg | ORAL_TABLET | Freq: Every day | ORAL | Status: DC

## 2020-10-16 MED ORDER — OXYCODONE HCL 5 MG PO TABS
5.0000 mg | ORAL_TABLET | ORAL | Status: DC | PRN
  Administered 2020-10-16 – 2020-10-17 (×4): 5 mg via ORAL
  Filled 2020-10-16 (×5): qty 1

## 2020-10-16 MED ORDER — MOMETASONE FURO-FORMOTEROL FUM 100-5 MCG/ACT IN AERO
2.0000 | INHALATION_SPRAY | Freq: Two times a day (BID) | RESPIRATORY_TRACT | Status: DC
  Administered 2020-10-16 – 2020-10-17 (×3): 2 via RESPIRATORY_TRACT
  Filled 2020-10-16: qty 8.8

## 2020-10-16 MED ORDER — INSULIN GLARGINE 100 UNIT/ML ~~LOC~~ SOLN
24.0000 [IU] | Freq: Every day | SUBCUTANEOUS | Status: DC
  Administered 2020-10-16 (×2): 24 [IU] via SUBCUTANEOUS
  Filled 2020-10-16 (×4): qty 0.24

## 2020-10-16 MED ORDER — SODIUM BICARBONATE 650 MG PO TABS
1300.0000 mg | ORAL_TABLET | Freq: Two times a day (BID) | ORAL | Status: DC
  Administered 2020-10-16 – 2020-10-17 (×4): 1300 mg via ORAL
  Filled 2020-10-16 (×5): qty 2

## 2020-10-16 MED ORDER — METOPROLOL SUCCINATE ER 50 MG PO TB24
50.0000 mg | ORAL_TABLET | Freq: Every day | ORAL | Status: DC
  Administered 2020-10-16: 50 mg via ORAL
  Filled 2020-10-16: qty 1

## 2020-10-16 MED ORDER — HEPARIN SODIUM (PORCINE) 5000 UNIT/ML IJ SOLN
5000.0000 [IU] | Freq: Three times a day (TID) | INTRAMUSCULAR | Status: DC
  Administered 2020-10-16 – 2020-10-17 (×5): 5000 [IU] via SUBCUTANEOUS
  Filled 2020-10-16 (×5): qty 1

## 2020-10-16 MED ORDER — METOPROLOL SUCCINATE ER 100 MG PO TB24
100.0000 mg | ORAL_TABLET | Freq: Every day | ORAL | Status: DC
  Administered 2020-10-17: 100 mg via ORAL
  Filled 2020-10-16: qty 1

## 2020-10-16 MED ORDER — EZETIMIBE 10 MG PO TABS
10.0000 mg | ORAL_TABLET | Freq: Every day | ORAL | Status: DC
  Administered 2020-10-16 – 2020-10-17 (×2): 10 mg via ORAL
  Filled 2020-10-16 (×2): qty 1

## 2020-10-16 MED ORDER — ASPIRIN EC 81 MG PO TBEC: 81.0000 mg | DELAYED_RELEASE_TABLET | Freq: Every day | ORAL | Status: DC

## 2020-10-16 NOTE — H&P (Signed)
History and Physical    [name] [id number] DOB: 01/17/1971 DOA: 10/15/2020  PCP: No primary care provider on file. Patient coming from: [facility]  Chief Complaint: Chest pain  HPI: [name] is a 50 y.o. female with medical history significant of chronic pancreatitis, COPD, CAD status post PCI/multiple stents, PAD, insulin-dependent diabetes, hyperlipidemia, hypertension, CKD stage III, GERD, gastroparesis, depression presenting to the ED via EMS from [place] with a chief complaint of chest pain.  She was given sublingual nitroglycerin x3 in route.  Patient is currently incarcerated but due to her chronic medical conditions she is currently placed at [facility] with 24/7 officer supervision.  Patient states this morning at her facility after they gave her her morning pills she started having substernal sharp chest pain at rest which radiated to her right jaw and right arm.  She felt short of breath and clammy at that time.  She also felt "a blood clot" go from her throat to her nose and subsequently coughed up a small amount of blood the size of her thumb.  States her chest pain persisted throughout the day and finally subsided after EMS gave her 3 nitroglycerin tablets.  Denies history of exertional chest pain.  Patient states she has chronic nausea and vomiting due to gastroparesis and most recent episode was 2 days ago.  No abdominal pain or diarrhea.  She is fully vaccinated against COVID including booster shot.  Her cardiologist is at [facility].  ED Course: Hemodynamically stable.  Labs showing WBC 8.3, hemoglobin 9.9, platelet count 265K.  Sodium 129, potassium 5.2, chloride 101, bicarb 19, anion gap 9, BUN 53, creatinine 4.0, glucose 166.  High-sensitivity troponin negative x2.  EKG without acute ischemic changes.  Magnesium 1.7.  Screening Covid test negative.  Chest x-ray showing hyperinflation of lungs with mild bibasilar subsegmental atelectasis. Patient was given Tylenol and 500 cc  normal saline bolus.   Review of Systems:  All systems reviewed and apart from history of presenting illness, are negative.  Past Medical History:  Diagnosis Date  . Chronic pancreatitis ([place])   . COPD (chronic obstructive pulmonary disease) ([place])   . Coronary artery disease   . 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 Diabetic neuropathy, painful ([place])   . Diverticulosis    s/p partial bowel resection  . Dyslipidemia   . Gastroparesis due to DM ([place])   . GERD (gastroesophageal reflux disease)   . Hypertension   . MI (myocardial infarction) ([place])   . Seizures ([place])     Past Surgical History:  Procedure Laterality Date  . ABDOMINAL HYSTERECTOMY    . BACK SURGERY    . BALLOON DILATION N/A 02/19/2013   Procedure: BALLOON DILATION;  Surgeon: Wonda Horner, MD;  Location: [facility];  Service: Endoscopy;  Laterality: N/A;  . CESAREAN SECTION    . CHOLECYSTECTOMY    . colon resection due to diverticulitis    . CORONARY STENT PLACEMENT     2 stents [place]  . ESOPHAGOGASTRODUODENOSCOPY N/A 12/27/2012   Procedure: ESOPHAGOGASTRODUODENOSCOPY (EGD);  Surgeon: Missy Sabins, MD;  Location: [facility];  Service: Endoscopy;  Laterality: N/A;  . ESOPHAGOGASTRODUODENOSCOPY N/A 09/13/2013   Procedure: ESOPHAGOGASTRODUODENOSCOPY (EGD);  Surgeon: Cleotis Nipper, MD;  Location: [facility];  Service: Endoscopy;  Laterality: N/A;  . ESOPHAGOGASTRODUODENOSCOPY (EGD) WITH ESOPHAGEAL DILATION N/A 02/19/2013   Procedure: ESOPHAGOGASTRODUODENOSCOPY (EGD) WITH ESOPHAGEAL DILATION;  Surgeon: Wonda Horner, MD;  Location: [facility];  Service: Endoscopy;  Laterality: N/A;  . peg tube x 4     feeding jejunostomies with PEG tubes     reports that she quit smoking about 7 years ago. Her smoking use included cigarettes and cigars. She has a 1.50 pack-year smoking history. She has never used smokeless tobacco. She reports that she does not drink alcohol and does not use  drugs.  Allergies  Allergen Reactions  . Fish Allergy Anaphylaxis  . Other Hives and Shortness Of Breath    Bell Peppers  Hives in mouth  . Lidocaine Nausea And Vomiting    When given in mouth only. Previously tolerated for PICC line placement.  . Acetaminophen   . Cefadroxil Hives    tolerated cefepime per [facility]  . Compazine [Prochlorperazine Edisylate] Hives    Tolerates promethazine  . Darvocet [Propoxyphene N-Acetaminophen] Hives  . Zofran [Ondansetron Hcl] Hives  . Bromfenac     Other reaction(s): Other (See Comments) Unknown reaction/Takes ASA daily at home  . Ketorolac Itching  . Nsaids Hives  . Penicillins Hives    Family History  Problem Relation Age of Onset  . Endometrial cancer Mother   . Diabetes Mellitus II Maternal Grandmother   . Diabetes Mellitus II Mother   . Diabetes Mellitus II Sister   . Emphysema Mother   . Heart attack Father     Prior to Admission medications   Medication Sig Start Date End Date Taking? Authorizing Provider  amitriptyline (ELAVIL) 100 MG tablet Take 100 mg by mouth at bedtime.   Yes [provider]  amLODipine (NORVASC) 10 MG tablet Take 10 mg by mouth daily.   Yes [provider]  aspirin EC 81 MG tablet Take 81 mg by mouth daily. Swallow whole.   Yes [provider]  atorvastatin (LIPITOR) 40 MG tablet Take 40 mg by mouth at bedtime.   Yes [provider]  busPIRone (BUSPAR) 15 MG tablet Take 15 mg by mouth 2 (two) times daily.   Yes [provider]  calcitRIOL (ROCALTROL) 0.25 MCG capsule Take 0.25 mcg by mouth daily.   Yes [provider]  clopidogrel (PLAVIX) 75 MG tablet Take 75 mg by mouth daily.   Yes [provider]  Darbepoetin Alfa 40 MCG/ML SOLN Inject 40 mcg as directed once a week.   Yes [provider]  docusate sodium (COLACE) 100 MG capsule Take 200 mg by mouth at bedtime.   Yes [provider]  ezetimibe (ZETIA) 10 MG  tablet Take 10 mg by mouth daily.   Yes [provider]  ferrous sulfate (FEROSUL) 325 (65 FE) MG tablet Take 325 mg by mouth daily with breakfast.   Yes [provider]  hydrALAZINE (APRESOLINE) 50 MG tablet Take 50 mg by mouth every 8 (eight) hours.   Yes [provider]  insulin lispro (HUMALOG) 100 UNIT/ML injection Inject into the skin 4 (four) times daily -  before meals and at bedtime.   Yes [provider]  isosorbide mononitrate (IMDUR) 60 MG 24 hr tablet Take 60 mg by mouth daily.   Yes [provider]  metoprolol succinate (TOPROL-XL) 50 MG 24 hr tablet Take 50 mg by mouth daily. Take with or immediately following a meal.   Yes [provider]  mometasone-formoterol (DULERA) 100-5 MCG/ACT AERO Inhale 2 puffs into the lungs 2 (two) times daily.   Yes [provider]  pantoprazole (PROTONIX) 40 MG tablet Take 40 mg by mouth daily.   Yes [provider]  polyethylene glycol powder (GLYCOLAX/MIRALAX) 17 GM/SCOOP powder Take 17 g by mouth daily.   Yes [provider]  sertraline (ZOLOFT) 100 MG tablet Take 100 mg by mouth daily.   Yes [provider]  Skin Protectants, Misc. (EUCERIN) cream Apply 1 application topically daily.   Yes [provider]  sodium bicarbonate 650 MG tablet Take 1,300 mg by mouth 2 (two) times daily.   Yes [provider]  insulin glargine (LANTUS) 100 UNIT/ML injection Inject 0.1 mLs (10 Units total) into the skin at bedtime. Patient taking differently: Inject 27 Units into the skin at bedtime. 10/30/13   Kinnie Feil, MD    Physical Exam: Vitals:   10/15/20 2111 10/15/20 2210 10/15/20 2213 10/15/20 2330  BP: (!) 144/83  (!) 142/84 (!) 148/86  Pulse: 77  81 75  Resp: 20  (!) 21 15  Temp: 98.4 F (36.9 C)     TempSrc: Oral     SpO2: 100%  98% 96%  Weight:  81.6 kg    Height:  '5\' 4"'$  (1.626 m)      Physical Exam Constitutional:      General: She  is not in acute distress. HENT:     Head: Normocephalic and atraumatic.     Mouth/Throat:     Mouth: Mucous membranes are dry.  Eyes:     Extraocular Movements: Extraocular movements intact.     Conjunctiva/sclera: Conjunctivae normal.  Cardiovascular:     Rate and Rhythm: Normal rate and regular rhythm.     Pulses: Normal pulses.  Pulmonary:     Effort: Pulmonary effort is normal. No respiratory distress.     Breath sounds: Normal breath sounds. No wheezing or rales.  Abdominal:     General: Bowel sounds are normal. There is no distension.     Palpations: Abdomen is soft.     Tenderness: There is no abdominal tenderness.  Musculoskeletal:        General: No swelling or tenderness.     Cervical back: Normal range of motion and neck supple.  Skin:    General: Skin is warm and dry.  Neurological:     General: No focal deficit present.     Mental Status: She is alert and oriented to person, place, and time.     Labs on Admission: I have personally reviewed following labs and imaging studies  CBC: Recent Labs  Lab 10/15/20 1754  WBC 8.3  HGB 9.9*  HCT 32.3*  MCV 88.5  PLT 99991111   Basic Metabolic Panel: Recent Labs  Lab 10/15/20 1754 10/15/20 1818  NA 129*  --   K 5.2*  --   CL 101  --   CO2 19*  --   GLUCOSE 166*  --   BUN 53*  --   CREATININE 4.03*  --   CALCIUM 8.9  --   MG  --  1.7   GFR: Estimated Creatinine Clearance: 17.3 mL/min (A) (by C-G formula based on SCr of 4.03 mg/dL (H)). Liver Function Tests: No results for input(s): AST, ALT, ALKPHOS, BILITOT, PROT, ALBUMIN in the last 168 hours. No results for input(s): LIPASE, AMYLASE in the last 168 hours. No results for input(s): AMMONIA in the last 168 hours. Coagulation Profile: No results for input(s): INR, PROTIME in the last 168 hours. Cardiac Enzymes: No results for input(s): CKTOTAL, CKMB, CKMBINDEX, TROPONINI in the last 168 hours. BNP (last 3 results) No results for input(s): PROBNP in the  last  8760 hours. HbA1C: No results for input(s): HGBA1C in the last 72 hours. CBG: No results for input(s): GLUCAP in the last 168 hours. Lipid Profile: No results for input(s): CHOL, HDL, LDLCALC, TRIG, CHOLHDL, LDLDIRECT in the last 72 hours. Thyroid Function Tests: No results for input(s): TSH, T4TOTAL, FREET4, T3FREE, THYROIDAB in the last 72 hours. Anemia Panel: No results for input(s): VITAMINB12, FOLATE, FERRITIN, TIBC, IRON, RETICCTPCT in the last 72 hours. Urine analysis:    Component Value Date/Time   COLORURINE YELLOW 11/11/2013 1211   APPEARANCEUR CLEAR 11/11/2013 1211   LABSPEC 1.013 11/11/2013 1211   PHURINE 8.0 11/11/2013 1211   GLUCOSEU 500 (A) 11/11/2013 1211   HGBUR TRACE (A) 11/11/2013 1211   BILIRUBINUR NEGATIVE 11/11/2013 1211   KETONESUR NEGATIVE 11/11/2013 1211   PROTEINUR >300 (A) 11/11/2013 1211   UROBILINOGEN 0.2 11/11/2013 1211   NITRITE NEGATIVE 11/11/2013 1211   LEUKOCYTESUR NEGATIVE 11/11/2013 1211    Radiological Exams on Admission: DG Chest 2 View  Result Date: 10/15/2020 CLINICAL DATA:  Chest pain. EXAM: CHEST - 2 VIEW COMPARISON:  November 11, 2013. FINDINGS: The heart size and mediastinal contours are within normal limits. No pneumothorax or pleural effusion is noted. Hypoinflation of the lungs is noted with mild bibasilar subsegmental atelectasis. The visualized skeletal structures are unremarkable. IMPRESSION: Hypoinflation of the lungs with mild bibasilar subsegmental atelectasis. Electronically Signed   By: Marijo Conception M.D.   On: 10/15/2020 18:24    EKG: Independently reviewed.  Sinus rhythm, no acute ischemic changes.  Assessment/Plan Principal Problem:   Chest pain Active Problems:   Hyponatremia   CAD (coronary artery disease)   AKI (acute kidney injury) ([place])   Diabetes ([place])   Chest pain: Her primary cardiologist is at [facility].  Work-up today not suggestive of ACS-high-sensitivity troponin negative x2 and EKG without acute ischemic  changes.  Chest pain appears atypical, however, given known history of CAD with multiple stents, admitted for observation and will need evaluation by cardiologist here in the morning. -Cardiac monitoring.  Sublingual nitroglycerin as needed.  EKG as needed for recurrence of chest pain.  Consult cardiology in a.m.  Hemoptysis: Patient states she coughed up a small amount of blood this morning after she took her morning pills.  No hemoptysis since he has been in the ED.  Hemoglobin stable. -Continue to monitor closely.  Resume home aspirin and Plavix in the morning if there is no recurrence of hemoptysis.  CAD status post multiple stents -Resume home aspirin and Plavix in the morning if there is no recurrence of hemoptysis.  Continue Imdur and statin.  AKI on CKD stage III: Likely prerenal from dehydration.  Has dry mucous membranes on exam.  BUN 53, creatinine 4.0.  Baseline 1.4-1.6 per labs done at [facility]. -IV fluid hydration.  Monitor renal function and urine output.  Avoid nephrotoxic agents/contrast.  Order renal ultrasound.  Check urine sodium and creatinine.  Continue home renal supplements.  Mild hyponatremia: Corrected sodium 130. -IV fluid hydration.  Check serum osmolarity.  Monitor sodium level closely.  Borderline hyperkalemia: Potassium 5.2.  Likely due to AKI. -Continue to monitor  Mild normal anion gap metabolic acidosis: Bicarb 19, anion gap 9.  Likely due to AKI. -IV fluid hydration, continue to monitor  COPD: Stable.  No signs of acute exacerbation. -Continue home inhalers  Insulin-dependent diabetes -Check A1c.  Sliding scale insulin moderate ACHS.  Continue home basal insulin.  Hyperlipidemia -Continue home Lipitor and Zetia  Hypertension: Stable. -Continue home amlodipine, hydralazine,  Imdur  Chronic anemia: Hemoglobin 9.9 and stable compared to prior labs from [facility]. -Continue home iron supplement  Chronic pain -Continue home oxycodone  Depression -Continue  home amitriptyline, BuSpar, Zoloft  Chronic constipation -Continue home Colace and MiraLAX  GERD -Continue PPI  DVT prophylaxis: Subcutaneous heparin Code Status: Full code Family Communication: No family available this time. Disposition Plan: Status is: Observation  The patient remains OBS appropriate and will d/c before 2 midnights.  Dispo: The patient is from: LTAC              Anticipated d/c is to: LTAC              Patient currently is not medically stable to d/c.   Difficult to place patient No  Level of care: Level of care: Telemetry Cardiac   The medical decision making on this patient was of high complexity and the patient is at high risk for clinical deterioration, therefore this is a level 3 visit.  Shela Leff MD [facility]  If 7PM-7AM, please contact night-coverage [email or web address]  10/16/2020, 1:25 AM

## 2020-10-16 NOTE — Progress Notes (Signed)
Received female pt from ER, alert oriented not in distress on room air with police officer around, transferred to bed, activities well tolerated, connected to cardiac monitor, CHG bath given, orient pt to room and use of nurse call bell, snacks given as per request, no further inquiry was made

## 2020-10-16 NOTE — Plan of Care (Signed)

## 2020-10-16 NOTE — Consult Note (Signed)
CARDIOLOGY CONSULT NOTE  Patient ID: [name] MRN: [id number] DOB/AGE: [birth year] 51 y.o.  Admit date: 10/15/2020 Referring Physician: [facility] hospitalist Reason for Consultation:  Chest pain  HPI:   50 year old female with hypertension, uncontrolled type 1 diabetes mellitus, hyperlipidemia, CAD s/p prior PCIs, PAD s/p b/l SFA PTA, CKD IV, admitted with chest pain  Patient is currently incarcerated, but recovering [facility] following [facility].  On 10/15/2020, patient had an episode of retrosternal chest pain radiating to right arm, sharp in nature, lasted for 30 minutes.  Pain somewhat improved with aspirin and sublingual this was preceded by an episode of "coughing up blood" and recurrent cough for an hour or 2 prior.  Patient was brought to [facility] emergency room.  ACS with serial troponins and EKG.  Work-up was significant for creatinine of 4.0, sodium 129.  Patient is currently chest pain-free.  She tells me that she has been worked up for vein mapping for potential dialysis access placement in the left upper arm.  She does not have a dialysis access at this time.  She was previously told to have CKD stage IV.  Patient sees cardiology vascular surgery at [facility].  She last had a stress test in 2020 with myocardial perfusion PET/CT, which was normal.  She last had peripheral intervention and October 2021 and has been on aspirin Plavix and statin.  At baseline, patient's physical activity has been limited since her hip surgery.     Past Medical History:  Diagnosis Date  . Chronic pancreatitis ([place])   . COPD (chronic obstructive pulmonary disease) ([place])   . Coronary artery disease   . 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 Diabetic neuropathy, painful ([place])   . Diverticulosis    s/p partial bowel resection  . Dyslipidemia   . Gastroparesis due to DM ([place])   . GERD (gastroesophageal reflux disease)   . Hypertension   . MI  (myocardial infarction) ([place])   . Seizures ([place])      Past Surgical History:  Procedure Laterality Date  . ABDOMINAL HYSTERECTOMY    . BACK SURGERY    . BALLOON DILATION N/A 02/19/2013   Procedure: BALLOON DILATION;  Surgeon: Wonda Horner, MD;  Location: [facility];  Service: Endoscopy;  Laterality: N/A;  . CESAREAN SECTION    . CHOLECYSTECTOMY    . colon resection due to diverticulitis    . CORONARY STENT PLACEMENT     2 stents [place]  . ESOPHAGOGASTRODUODENOSCOPY N/A 12/27/2012   Procedure: ESOPHAGOGASTRODUODENOSCOPY (EGD);  Surgeon: Missy Sabins, MD;  Location: [facility];  Service: Endoscopy;  Laterality: N/A;  . ESOPHAGOGASTRODUODENOSCOPY N/A 09/13/2013   Procedure: ESOPHAGOGASTRODUODENOSCOPY (EGD);  Surgeon: Cleotis Nipper, MD;  Location: [facility];  Service: Endoscopy;  Laterality: N/A;  . ESOPHAGOGASTRODUODENOSCOPY (EGD) WITH ESOPHAGEAL DILATION N/A 02/19/2013   Procedure: ESOPHAGOGASTRODUODENOSCOPY (EGD) WITH ESOPHAGEAL DILATION;  Surgeon: Wonda Horner, MD;  Location: [facility];  Service: Endoscopy;  Laterality: N/A;  . peg tube x 4     feeding jejunostomies with PEG tubes      Family History  Problem Relation Age of Onset  . Endometrial cancer Mother   . Diabetes Mellitus II Maternal Grandmother   . Diabetes Mellitus II Mother   . Diabetes Mellitus II Sister   . Emphysema Mother   . Heart attack Father      Social History: Social History   Socioeconomic History  . Marital status: Divorced  Spouse name: Not on file  . Number of children: Not on file  . Years of education: Not on file  . Highest education level: Not on file  Occupational History  . Occupation: unemployed  Tobacco Use  . Smoking status: Former Smoker    Packs/day: 0.25    Years: 6.00    Pack years: 1.50    Types: Cigarettes, Cigars    Quit date: 08/01/2013    Years since quitting: 7.2  . Smokeless tobacco: Never Used  Substance and Sexual Activity  . Alcohol use: No  .  Drug use: No  . Sexual activity: Never  Other Topics Concern  . Not on file  Social History Narrative   LIves with her sister.  She is disability.  Does not use assist device.  [employer] RN and aid.     Social Determinants of Health   Financial Resource Strain: Not on file  Food Insecurity: Not on file  Transportation Needs: Not on file  Physical Activity: Not on file  Stress: Not on file  Social Connections: Not on file  Intimate Partner Violence: Not on file     Medications Prior to Admission  Medication Sig Dispense Refill Last Dose  . albuterol (VENTOLIN HFA) 108 (90 Base) MCG/ACT inhaler Inhale 1 puff into the lungs every 8 (eight) hours as needed for wheezing or shortness of breath.   unk  . amitriptyline (ELAVIL) 100 MG tablet Take 100 mg by mouth at bedtime.   10/14/2020  . amLODipine (NORVASC) 10 MG tablet Take 10 mg by mouth daily.   10/14/2020 at Unknown time  . aspirin EC 81 MG tablet Take 81 mg by mouth daily. Swallow whole.   10/15/2020 at Unknown time  . atorvastatin (LIPITOR) 40 MG tablet Take 40 mg by mouth at bedtime.   10/14/2020 at Unknown time  . busPIRone (BUSPAR) 15 MG tablet Take 15 mg by mouth 2 (two) times daily.   10/15/2020 at Unknown time  . calcitRIOL (ROCALTROL) 0.25 MCG capsule Take 0.25 mcg by mouth daily.   10/14/2020 at Unknown time  . clopidogrel (PLAVIX) 75 MG tablet Take 75 mg by mouth daily.   10/14/2020 at Unknown time  . Darbepoetin Alfa 40 MCG/ML SOLN Inject 40 mcg as directed every Thursday.   10/08/2020  . diphenhydrAMINE (BENADRYL) 25 MG tablet Take 25 mg by mouth every 6 (six) hours as needed for itching or sleep.   10/15/2020 at Unknown time  . docusate sodium (COLACE) 100 MG capsule Take 200 mg by mouth at bedtime.   10/14/2020 at Unknown time  . ezetimibe (ZETIA) 10 MG tablet Take 10 mg by mouth daily.   10/14/2020 at Unknown time  . ferrous sulfate 325 (65 FE) MG tablet Take 325 mg by mouth daily with breakfast.   10/14/2020 at Unknown time  .  hydrALAZINE (APRESOLINE) 50 MG tablet Take 100 mg by mouth every 8 (eight) hours.   10/14/2020 at Unknown time  . insulin glargine (LANTUS) 100 UNIT/ML injection Inject 0.1 mLs (10 Units total) into the skin at bedtime. (Patient taking differently: Inject 24 Units into the skin at bedtime.) 20 mL 11 10/14/2020 at Unknown time  . insulin lispro (HUMALOG) 100 UNIT/ML injection Inject 3-5 Units into the skin See admin instructions. Per sliding scale before meals and at bedtime <150-0 units, 151-200= 1 unit, 201-250=2 units,251-300=3 units, 301-350= 4 units, 351-400=5 units. >400  or <70 call md   10/15/2020 at Unknown time  . isosorbide mononitrate (IMDUR) 60  MG 24 hr tablet Take 60 mg by mouth daily.   10/15/2020 at Unknown time  . melatonin 3 MG TABS tablet Take 3 mg by mouth at bedtime as needed (sleep).   10/14/2020 at Unknown time  . metoprolol succinate (TOPROL-XL) 50 MG 24 hr tablet Take 50 mg by mouth daily. Take with or immediately following a meal.   10/14/2020 at Unknown time  . mometasone-formoterol (DULERA) 100-5 MCG/ACT AERO Inhale 2 puffs into the lungs 2 (two) times daily.   10/15/2020 at Unknown time  . nitroGLYCERIN (NITROSTAT) 0.4 MG SL tablet Place 0.4 mg under the tongue every 5 (five) minutes as needed for chest pain.   10/15/2020 at Unknown time  . oxyCODONE (OXY IR/ROXICODONE) 5 MG immediate release tablet Take 5 mg by mouth every 4 (four) hours as needed for severe pain.   10/15/2020 at Unknown time  . pantoprazole (PROTONIX) 40 MG tablet Take 40 mg by mouth daily.   10/14/2020 at Unknown time  . polyethylene glycol powder (GLYCOLAX/MIRALAX) 17 GM/SCOOP powder Take 17 g by mouth daily.   10/14/2020 at Unknown time  . sertraline (ZOLOFT) 100 MG tablet Take 100 mg by mouth daily.   10/15/2020 at Unknown time  . Skin Protectants, Misc. (EUCERIN) cream Apply 1 application topically daily.   10/14/2020 at Unknown time  . sodium bicarbonate 650 MG tablet Take 1,300 mg by mouth 2 (two) times daily.    10/15/2020 at Unknown time  . SODIUM CHLORIDE IN Place 2 sprays into both nostrils every 4 (four) hours as needed (congestion).   10/15/2020 at Unknown time    Review of Systems  Constitutional: Negative for decreased appetite, malaise/fatigue, weight gain and weight loss.  HENT: Negative for congestion.   Eyes: Negative for visual disturbance.  Cardiovascular: Positive for chest pain (Now resolved). Negative for dyspnea on exertion, leg swelling, palpitations and syncope.  Respiratory: Positive for cough and hemoptysis.   Endocrine: Negative for cold intolerance.  Hematologic/Lymphatic: Does not bruise/bleed easily.  Skin: Negative for itching and rash.  Musculoskeletal: Negative for myalgias.  Gastrointestinal: Negative for abdominal pain, nausea and vomiting.  Genitourinary: Negative for dysuria.  Neurological: Negative for dizziness and weakness.  Psychiatric/Behavioral: The patient is not nervous/anxious.   All other systems reviewed and are negative.     Physical Exam: Physical Exam Vitals and nursing note reviewed.  Constitutional:      General: She is not in acute distress.    Appearance: She is well-developed.  HENT:     Head: Normocephalic and atraumatic.  Eyes:     Conjunctiva/sclera: Conjunctivae normal.     Pupils: Pupils are equal, round, and reactive to light.  Neck:     Vascular: No JVD.  Cardiovascular:     Rate and Rhythm: Normal rate and regular rhythm.     Pulses: Intact distal pulses.          Dorsalis pedis pulses are 0 on the right side and 0 on the left side.       Posterior tibial pulses are 1+ on the right side and 1+ on the left side.     Heart sounds: No murmur heard.   Pulmonary:     Effort: Pulmonary effort is normal.     Breath sounds: Normal breath sounds. No wheezing or rales.  Abdominal:     General: Bowel sounds are normal.     Palpations: Abdomen is soft.     Tenderness: There is no rebound.  Musculoskeletal:  General: No  tenderness. Normal range of motion.     Right lower leg: No edema.     Left lower leg: No edema.  Lymphadenopathy:     Cervical: No cervical adenopathy.  Skin:    General: Skin is warm and dry.  Neurological:     Mental Status: She is alert and oriented to person, place, and time.     Cranial Nerves: No cranial nerve deficit.      Labs:   Lab Results  Component Value Date   WBC 8.3 10/15/2020   HGB 9.9 (L) 10/15/2020   HCT 32.3 (L) 10/15/2020   MCV 88.5 10/15/2020   PLT 265 10/15/2020    Recent Labs  Lab 10/15/20 1754  NA 129*  K 5.2*  CL 101  CO2 19*  BUN 53*  CREATININE 4.03*  CALCIUM 8.9  GLUCOSE 166*    Lipid Panel     Component Value Date/Time   CHOL 179 08/01/2013 0500   TRIG 175 (H) 10/28/2013 0443   HDL 29 (L) 08/01/2013 0500   CHOLHDL 6.2 08/01/2013 0500   VLDL 47 (H) 08/01/2013 0500   LDLCALC 103 (H) 08/01/2013 0500    BNP (last 3 results) No results for input(s): BNP in the last 8760 hours.  HEMOGLOBIN A1C Lab Results  Component Value Date   HGBA1C 8.6 (H) 09/20/2013   MPG 200 (H) 09/20/2013    Cardiac Panel (last 3 results) Results for MINYON, TOW (MRN [id number]) as of 10/16/2020 10:58  Ref. Range 10/15/2020 17:54 10/15/2020 22:12  Troponin I (High Sensitivity) Latest Ref Range: <18 ng/L 7 5     Radiology: DG Chest 2 View  Result Date: 10/15/2020 CLINICAL DATA:  Chest pain. EXAM: CHEST - 2 VIEW COMPARISON:  November 11, 2013. FINDINGS: The heart size and mediastinal contours are within normal limits. No pneumothorax or pleural effusion is noted. Hypoinflation of the lungs is noted with mild bibasilar subsegmental atelectasis. The visualized skeletal structures are unremarkable. IMPRESSION: Hypoinflation of the lungs with mild bibasilar subsegmental atelectasis. Electronically Signed   By: Marijo Conception M.D.   On: 10/15/2020 18:24   US RENAL  Result Date: 10/16/2020 CLINICAL DATA:  Acute renal insufficiency EXAM: RENAL / URINARY TRACT  ULTRASOUND COMPLETE COMPARISON:  None. FINDINGS: Right Kidney: Renal measurements: 8.8 x 5.2 x 4.5 cm = volume: 106.8 mL. Mild increased renal cortical echotexture. Corticomedullary differentiation well preserved. No mass or hydronephrosis. Left Kidney: Renal measurements: 11.4 x 5.5 x 4.5 cm = volume: 148.4 mL. Mild increased renal cortical echotexture. Corticomedullary differentiation is preserved. No mass or hydronephrosis. Bladder: Bladder is moderately distended, with echogenic debris identified. No filling defects. Other: None. IMPRESSION: 1. Mild increased renal cortical echotexture in the bilateral kidneys, consistent with medical renal disease. 2. Echogenic debris within the urinary bladder. Please correlate with urinalysis. Electronically Signed   By: Randa Ngo M.D.   On: 10/16/2020 02:49    Scheduled Meds: . amitriptyline  100 mg Oral QHS  . amLODipine  10 mg Oral Daily  . atorvastatin  40 mg Oral QHS  . busPIRone  15 mg Oral BID  . calcitRIOL  0.25 mcg Oral Daily  . docusate sodium  200 mg Oral QHS  . ezetimibe  10 mg Oral Daily  . ferrous sulfate  325 mg Oral Q breakfast  . heparin  5,000 Units Subcutaneous Q8H  . hydrALAZINE  100 mg Oral Q8H  . insulin aspart  0-15 Units Subcutaneous TID WC  . insulin  aspart  0-5 Units Subcutaneous QHS  . insulin glargine  24 Units Subcutaneous QHS  . isosorbide mononitrate  60 mg Oral Daily  . metoprolol succinate  50 mg Oral Daily  . mometasone-formoterol  2 puff Inhalation BID  . pantoprazole  40 mg Oral Daily  . polyethylene glycol  17 g Oral Daily  . sertraline  100 mg Oral Daily  . sodium bicarbonate  1,300 mg Oral BID   Continuous Infusions: . sodium chloride 125 mL/hr at 10/16/20 0341   PRN Meds:.albuterol, nitroGLYCERIN, oxyCODONE  CARDIAC STUDIES:  EKG 10/15/2020: Sinus rhythm. Possible old anteroseptal and inferior infarct  Echocardiogram: None   Assessment & Recommendations:  51 year old female with  hypertension, uncontrolled 1 type diabetes mellitus, hyperlipidemia, CAD s/p prior PCIs, PAD s/p b/l SFA PTA, CKD IV, admitted with chest pain  Chest pain: Some features of typical chest pain, however negative EKG and troponin.  Pain occurred following bout of cough and possible hemoptysis, raising possibility of URI.  Given absence of ACS, do not recommend emergent work-up. Given her advanced CKD, this option will be to uptitrate and continue medical management.  Noninvasive work-up with stress tests would not change overall management given inability to perform invasive work-up at this time. Increase metoprolol succinate to 100 mg daily. I suspect her increased creatinine is progression of CKD then AKI alone. Continue outpatient management with PCP and nephrology.   Continue outpatient management with her primary cardiologist at [facility].       Nigel Mormon, MD Pager: [phone] Office: [phone]

## 2020-10-16 NOTE — Progress Notes (Signed)
Patient seen and examined this is a 50 year old chronically ill female history of insulin-dependent diabetes, gastroparesis, GERD, chronic pancreatitis, CAD with history of PCI and stents, CKD 3b, PAD with multiple stents, currently incarcerated for the last few months ago at [facility] in [place], with a hip fracture, underwent surgery done and was discharged to [facility] for rehabilitation due to her chronic medical conditions along with officer supervision. -She was sent to the ED with sharp chest pain which radiated to her right jaw and right arm yesterday associated with some cough and sputum with some blood. -In the ED she was hemodynamically stable, labs were notable for creatinine of 4, chest x-ray was unremarkable, EKG without acute ST-T wave changes and high-sensitivity troponin was normal  Atypical chest pain -EKG without ischemic changes, high-sensitivity troponin is negative -Extensive history of PCI and multiple stents -Appreciate cardiology input, no further symptoms at this time, no plans for additional work-up in the absence of ACS and worsening kidney function  CAD, history of multiple PCI and stents -Restart aspirin and Plavix  AKI on CKD stage III -Baseline creatinine around 1.6-2.2 at [facility] in 04/2019, no recent labs for comparison -Suspect this is progression of CKD, patient tells me that she recently had vein mapping in anticipation of AV fistula for dialysis -Continue gentle IV fluids today, monitor urine output -BMP in a.m. -Will request recent labs from [facility]  COPD -Stable, continue inhalers  Insulin-dependent diabetes mellitus -Continue home basal insulin and sliding scale  Chronic pain -Continue home regimen of oxycodone  Chronic anemia -Stable, continue iron  Domenic Polite, MD

## 2020-10-17 DIAGNOSIS — Z91018 Allergy to other foods: Secondary | ICD-10-CM | POA: Diagnosis not present

## 2020-10-17 DIAGNOSIS — E1122 Type 2 diabetes mellitus with diabetic chronic kidney disease: Secondary | ICD-10-CM | POA: Diagnosis present

## 2020-10-17 DIAGNOSIS — E875 Hyperkalemia: Secondary | ICD-10-CM | POA: Diagnosis present

## 2020-10-17 DIAGNOSIS — Z955 Presence of coronary angioplasty implant and graft: Secondary | ICD-10-CM | POA: Diagnosis not present

## 2020-10-17 DIAGNOSIS — N184 Chronic kidney disease, stage 4 (severe): Secondary | ICD-10-CM | POA: Diagnosis present

## 2020-10-17 DIAGNOSIS — Z91013 Allergy to seafood: Secondary | ICD-10-CM | POA: Diagnosis not present

## 2020-10-17 DIAGNOSIS — N179 Acute kidney failure, unspecified: Secondary | ICD-10-CM | POA: Diagnosis present

## 2020-10-17 DIAGNOSIS — Z20822 Contact with and (suspected) exposure to covid-19: Secondary | ICD-10-CM | POA: Diagnosis present

## 2020-10-17 DIAGNOSIS — I2510 Atherosclerotic heart disease of native coronary artery without angina pectoris: Secondary | ICD-10-CM | POA: Diagnosis present

## 2020-10-17 DIAGNOSIS — E871 Hypo-osmolality and hyponatremia: Secondary | ICD-10-CM | POA: Diagnosis present

## 2020-10-17 DIAGNOSIS — K861 Other chronic pancreatitis: Secondary | ICD-10-CM | POA: Diagnosis present

## 2020-10-17 DIAGNOSIS — Z794 Long term (current) use of insulin: Secondary | ICD-10-CM | POA: Diagnosis not present

## 2020-10-17 DIAGNOSIS — E785 Hyperlipidemia, unspecified: Secondary | ICD-10-CM | POA: Diagnosis present

## 2020-10-17 DIAGNOSIS — J449 Chronic obstructive pulmonary disease, unspecified: Secondary | ICD-10-CM | POA: Diagnosis present

## 2020-10-17 DIAGNOSIS — R0789 Other chest pain: Secondary | ICD-10-CM | POA: Diagnosis present

## 2020-10-17 DIAGNOSIS — K219 Gastro-esophageal reflux disease without esophagitis: Secondary | ICD-10-CM | POA: Diagnosis present

## 2020-10-17 DIAGNOSIS — Z880 Allergy status to penicillin: Secondary | ICD-10-CM | POA: Diagnosis not present

## 2020-10-17 DIAGNOSIS — R042 Hemoptysis: Secondary | ICD-10-CM | POA: Diagnosis present

## 2020-10-17 DIAGNOSIS — D631 Anemia in chronic kidney disease: Secondary | ICD-10-CM | POA: Diagnosis present

## 2020-10-17 DIAGNOSIS — E872 Acidosis: Secondary | ICD-10-CM | POA: Diagnosis present

## 2020-10-17 DIAGNOSIS — Z888 Allergy status to other drugs, medicaments and biological substances status: Secondary | ICD-10-CM | POA: Diagnosis not present

## 2020-10-17 DIAGNOSIS — I252 Old myocardial infarction: Secondary | ICD-10-CM | POA: Diagnosis not present

## 2020-10-17 DIAGNOSIS — Z79899 Other long term (current) drug therapy: Secondary | ICD-10-CM | POA: Diagnosis not present

## 2020-10-17 DIAGNOSIS — G8929 Other chronic pain: Secondary | ICD-10-CM | POA: Diagnosis present

## 2020-10-17 LAB — BASIC METABOLIC PANEL
Anion gap: 11 (ref 5–15)
Anion gap: 11 (ref 5–15)
Anion gap: 8 (ref 5–15)
BUN: 49 mg/dL — ABNORMAL HIGH (ref 6–20)
BUN: 48 mg/dL — ABNORMAL HIGH (ref 6–20)
BUN: 47 mg/dL — ABNORMAL HIGH (ref 6–20)
CO2: 15 mmol/L — ABNORMAL LOW (ref 22–32)
CO2: 16 mmol/L — ABNORMAL LOW (ref 22–32)
CO2: 19 mmol/L — ABNORMAL LOW (ref 22–32)
Calcium: 8.8 mg/dL — ABNORMAL LOW (ref 8.9–10.3)
Calcium: 8.5 mg/dL — ABNORMAL LOW (ref 8.9–10.3)
Chloride: 109 mmol/L (ref 98–111)
Chloride: 107 mmol/L (ref 98–111)
Chloride: 107 mmol/L (ref 98–111)
Creatinine, Ser: 3.88 mg/dL — ABNORMAL HIGH (ref 0.44–1.00)
Creatinine, Ser: 3.79 mg/dL — ABNORMAL HIGH (ref 0.44–1.00)
Creatinine, Ser: 3.81 mg/dL — ABNORMAL HIGH (ref 0.44–1.00)
GFR, Estimated: 13 mL/min — ABNORMAL LOW (ref >60)
GFR, Estimated: 14 mL/min — ABNORMAL LOW (ref >60)
GFR, Estimated: 14 mL/min — ABNORMAL LOW (ref >60)
Glucose, Bld: 142 mg/dL — ABNORMAL HIGH (ref 70–99)
Glucose, Bld: 170 mg/dL — ABNORMAL HIGH (ref 70–99)
Glucose, Bld: 211 mg/dL — ABNORMAL HIGH (ref 70–99)
Potassium: 5.9 mmol/L — ABNORMAL HIGH (ref 3.5–5.1)
Potassium: 5.2 mmol/L — ABNORMAL HIGH (ref 3.5–5.1)
Potassium: 4.8 mmol/L (ref 3.5–5.1)
Sodium: 135 mmol/L (ref 135–145)
Sodium: 134 mmol/L — ABNORMAL LOW (ref 135–145)
Sodium: 134 mmol/L — ABNORMAL LOW (ref 135–145)

## 2020-10-17 LAB — CBC
HCT: 26.9 % — ABNORMAL LOW (ref 36.0–46.0)
Hemoglobin: 8.9 g/dL — ABNORMAL LOW (ref 12.0–15.0)
MCH: 28.1 pg (ref 26.0–34.0)
MCHC: 33.1 g/dL (ref 30.0–36.0)
MCV: 84.9 fL (ref 80.0–100.0)
Platelets: 72 10*3/uL — ABNORMAL LOW (ref 150–400)
RBC: 3.17 MIL/uL — ABNORMAL LOW (ref 3.87–5.11)
RDW: 14.8 % (ref 11.5–15.5)
WBC: 5.8 10*3/uL (ref 4.0–10.5)
nRBC: 0 % (ref 0.0–0.2)

## 2020-10-17 LAB — GLUCOSE, CAPILLARY
Glucose-Capillary: 145 mg/dL — ABNORMAL HIGH (ref 70–99)
Glucose-Capillary: 116 mg/dL — ABNORMAL HIGH (ref 70–99)
Glucose-Capillary: 182 mg/dL — ABNORMAL HIGH (ref 70–99)

## 2020-10-17 LAB — HIV ANTIBODY (ROUTINE TESTING W REFLEX): HIV Screen 4th Generation wRfx: NONREACTIVE

## 2020-10-17 LAB — HEMOGLOBIN A1C
Hgb A1c MFr Bld: 8.2 % — ABNORMAL HIGH (ref 4.8–5.6)
Mean Plasma Glucose: 188.64 mg/dL

## 2020-10-17 MED ORDER — SODIUM ZIRCONIUM CYCLOSILICATE 10 G PO PACK
10.0000 g | PACK | Freq: Every day | ORAL | Status: DC
  Administered 2020-10-17: 10 g via ORAL
  Filled 2020-10-17: qty 1

## 2020-10-17 NOTE — Progress Notes (Signed)
Discharged back to [place]  thru [facility]  with guard .

## 2020-10-17 NOTE — Progress Notes (Signed)
PROGRESS NOTE    [name]  [id number] DOB: 1971/06/06 DOA: 10/15/2020 PCP: No primary care provider on file.  Brief Narrative:50 year old chronically ill female history of insulin-dependent diabetes, gastroparesis, GERD, chronic pancreatitis, CAD with history of PCI and stents, CKD 3b, PAD with multiple stents, currently incarcerated for the last few months ago at [facility] in [place], with a hip fracture, underwent surgery done and was discharged to [facility] for rehabilitation due to her chronic medical conditions along with officer supervision. -She was sent to the ED with sharp chest pain which radiated to her right jaw and right arm yesterday associated with some cough and sputum with some blood. -In the ED she was hemodynamically stable, labs were notable for creatinine of 4, chest x-ray was unremarkable, EKG without acute ST-T wave changes and high-sensitivity troponin was normal   Assessment & Plan:  Atypical chest pain -EKG without ischemic changes, high-sensitivity troponin is negative -Extensive history of PCI and multiple stents -Appreciate cardiology input, no plans for additional work-up in the absence of ACS and worsening kidney function  CAD, history of multiple PCI and stents -Continue Plavix, metoprolol, statin  CKD 4 Hyperkalemia -Baseline creatinine ranges from 3.5-4, so this is not acute, I called [facility] yesterday to obtain baseline labs -Followed by [facility], has completed vein mapping in anticipation of AV fistula -Potassium is elevated will give Lokelma today, continue renal diet -Repeat BMP this afternoon, discharge back to [facility] when potassium is stabilized  COPD -Stable, continue inhalers  Insulin-dependent diabetes mellitus -Continue home basal insulin and sliding scale  Chronic pain -Continue home regimen of oxycodone  Anemia of chronic disease -Stable, continue iron  DVT prophylaxis: Heparin  subcutaneous Code Status: Full code Family Communication: No family at bedside Disposition Plan:  Status is: Observation  The patient will require care spanning > 2 midnights and should be moved to inpatient because: Persistent severe electrolyte disturbances  Dispo: The patient is from: SNF              Anticipated d/c is to: SNF              Patient currently is not medically stable to d/c.   Difficult to place patient No  Consultants:   Cardiology   Procedures:   Antimicrobials:    Subjective: Complains of some heartburn earlier today, feels okay now  Objective: Vitals:   10/17/20 0733 10/17/20 0905 10/17/20 1000 10/17/20 1058  BP: 137/78  (!) 179/93 (!) 148/91  Pulse: 91   87  Resp: 18   20  Temp: 98 F (36.7 C)   97.9 F (36.6 C)  TempSrc: Oral   Oral  SpO2: 99% 99%  97%  Weight:      Height:        Intake/Output Summary (Last 24 hours) at 10/17/2020 1200 Last data filed at 10/17/2020 0730 Gross per 24 hour  Intake 552.41 ml  Output 500 ml  Net 52.41 ml   Filed Weights   10/15/20 2210 10/16/20 0240  Weight: 81.6 kg 91.7 kg    Examination:  General exam: Pleasant middle-aged female sitting up in bed, AAOx3, no distress HEENT: No JVD CVS: S1-S2, regular rate rhythm Lungs: Clear bilaterally Abdomen: Soft, nontender, bowel sounds present Extremities: No edema, left arm in shackles Skin: No rashes on exposed skin Psychiatry: Mood & affect appropriate.     Data Reviewed:   CBC: Recent Labs  Lab 10/15/20 1754 10/17/20 0623  WBC 8.3 5.8  HGB 9.9* 8.9*  HCT 32.3* 26.9*  MCV 88.5 84.9  PLT 265 72*   Basic Metabolic Panel: Recent Labs  Lab 10/15/20 1754 10/15/20 1818 10/17/20 0623 10/17/20 0910  NA 129*  --  135 134*  K 5.2*  --  5.9* 5.2*  CL 101  --  109 107  CO2 19*  --  15* 16*  GLUCOSE 166*  --  142* 170*  BUN 53*  --  49* 48*  CREATININE 4.03*  --  3.88* 3.79*  CALCIUM 8.9  --  RESULTS UNAVAILABLE DUE TO INTERFERING SUBSTANCE  8.8*  MG  --  1.7  --   --    GFR: Estimated Creatinine Clearance: 19.5 mL/min (A) (by C-G formula based on SCr of 3.79 mg/dL (H)). Liver Function Tests: No results for input(s): AST, ALT, ALKPHOS, BILITOT, PROT, ALBUMIN in the last 168 hours. No results for input(s): LIPASE, AMYLASE in the last 168 hours. No results for input(s): AMMONIA in the last 168 hours. Coagulation Profile: No results for input(s): INR, PROTIME in the last 168 hours. Cardiac Enzymes: No results for input(s): CKTOTAL, CKMB, CKMBINDEX, TROPONINI in the last 168 hours. BNP (last 3 results) No results for input(s): PROBNP in the last 8760 hours. HbA1C: Recent Labs    10/17/20 0623  HGBA1C 8.2*   CBG: Recent Labs  Lab 10/16/20 1123 10/16/20 1538 10/16/20 2111 10/17/20 0610 10/17/20 1104  GLUCAP 147* 136* 132* 145* 116*   Lipid Profile: No results for input(s): CHOL, HDL, LDLCALC, TRIG, CHOLHDL, LDLDIRECT in the last 72 hours. Thyroid Function Tests: No results for input(s): TSH, T4TOTAL, FREET4, T3FREE, THYROIDAB in the last 72 hours. Anemia Panel: No results for input(s): VITAMINB12, FOLATE, FERRITIN, TIBC, IRON, RETICCTPCT in the last 72 hours. Urine analysis:    Component Value Date/Time   COLORURINE YELLOW 11/11/2013 1211   APPEARANCEUR CLEAR 11/11/2013 1211   LABSPEC 1.013 11/11/2013 1211   PHURINE 8.0 11/11/2013 1211   GLUCOSEU 500 (A) 11/11/2013 1211   HGBUR TRACE (A) 11/11/2013 1211   BILIRUBINUR NEGATIVE 11/11/2013 1211   KETONESUR NEGATIVE 11/11/2013 1211   PROTEINUR >300 (A) 11/11/2013 1211   UROBILINOGEN 0.2 11/11/2013 1211   NITRITE NEGATIVE 11/11/2013 1211   LEUKOCYTESUR NEGATIVE 11/11/2013 1211   Sepsis Labs: '@LABRCNTIP'$ (procalcitonin:4,lacticidven:4)  ) Recent Results (from the past 240 hour(s))  Resp Panel by RT-PCR (Flu A&B, Covid) Nasopharyngeal Swab     Status: None   Collection Time: 10/15/20 10:33 PM   Specimen: Nasopharyngeal Swab; Nasopharyngeal(NP) swabs in vial  transport medium  Result Value Ref Range Status   SARS Coronavirus 2 by RT PCR NEGATIVE NEGATIVE Final    Comment: (NOTE) SARS-CoV-2 target nucleic acids are NOT DETECTED.  The SARS-CoV-2 RNA is generally detectable in upper respiratory specimens during the acute phase of infection. The lowest concentration of SARS-CoV-2 viral copies this assay can detect is 138 copies/mL. A negative result does not preclude SARS-Cov-2 infection and should not be used as the sole basis for treatment or other patient management decisions. A negative result may occur with  improper specimen collection/handling, submission of specimen other than nasopharyngeal swab, presence of viral mutation(s) within the areas targeted by this assay, and inadequate number of viral copies(<138 copies/mL). A negative result must be combined with clinical observations, patient history, and epidemiological information. The expected result is Negative.  Fact Sheet for Patients:  EntrepreneurPulse.com.au  Fact Sheet for Healthcare Providers:  IncredibleEmployment.be  This test is no t yet approved or cleared by the [place]  States FDA and  has been authorized for detection and/or diagnosis of SARS-CoV-2 by FDA under an Emergency Use Authorization (EUA). This EUA will remain  in effect (meaning this test can be used) for the duration of the COVID-19 declaration under Section 564(b)(1) of the Act, 21 U.S.C.section 360bbb-3(b)(1), unless the authorization is terminated  or revoked sooner.       Influenza A by PCR NEGATIVE NEGATIVE Final   Influenza B by PCR NEGATIVE NEGATIVE Final    Comment: (NOTE) The Xpert Xpress SARS-CoV-2/FLU/RSV plus assay is intended as an aid in the diagnosis of influenza from Nasopharyngeal swab specimens and should not be used as a sole basis for treatment. Nasal washings and aspirates are unacceptable for Xpert Xpress SARS-CoV-2/FLU/RSV testing.  Fact  Sheet for Patients: EntrepreneurPulse.com.au  Fact Sheet for Healthcare Providers: IncredibleEmployment.be  This test is not yet approved or cleared by the [place] FDA and has been authorized for detection and/or diagnosis of SARS-CoV-2 by FDA under an Emergency Use Authorization (EUA). This EUA will remain in effect (meaning this test can be used) for the duration of the COVID-19 declaration under Section 564(b)(1) of the Act, 21 U.S.C. section 360bbb-3(b)(1), unless the authorization is terminated or revoked.  Performed at [facility], [place] [address] [place], [place] [address]   MRSA PCR Screening     Status: None   Collection Time: 10/16/20  2:43 AM   Specimen: Nasopharyngeal  Result Value Ref Range Status   MRSA by PCR NEGATIVE NEGATIVE Final    Comment:        The GeneXpert MRSA Assay (FDA approved for NASAL specimens only), is one component of a comprehensive MRSA colonization surveillance program. It is not intended to diagnose MRSA infection nor to guide or monitor treatment for MRSA infections. Performed at [facility], [place] [address] [place], [place] [address]          Radiology Studies: DG Chest 2 View  Result Date: 10/15/2020 CLINICAL DATA:  Chest pain. EXAM: CHEST - 2 VIEW COMPARISON:  November 11, 2013. FINDINGS: The heart size and mediastinal contours are within normal limits. No pneumothorax or pleural effusion is noted. Hypoinflation of the lungs is noted with mild bibasilar subsegmental atelectasis. The visualized skeletal structures are unremarkable. IMPRESSION: Hypoinflation of the lungs with mild bibasilar subsegmental atelectasis. Electronically Signed   By: Marijo Conception M.D.   On: 10/15/2020 18:24   US RENAL  Result Date: 10/16/2020 CLINICAL DATA:  Acute renal insufficiency EXAM: RENAL / URINARY TRACT ULTRASOUND COMPLETE COMPARISON:  None. FINDINGS: Right Kidney: Renal measurements: 8.8 x  5.2 x 4.5 cm = volume: 106.8 mL. Mild increased renal cortical echotexture. Corticomedullary differentiation well preserved. No mass or hydronephrosis. Left Kidney: Renal measurements: 11.4 x 5.5 x 4.5 cm = volume: 148.4 mL. Mild increased renal cortical echotexture. Corticomedullary differentiation is preserved. No mass or hydronephrosis. Bladder: Bladder is moderately distended, with echogenic debris identified. No filling defects. Other: None. IMPRESSION: 1. Mild increased renal cortical echotexture in the bilateral kidneys, consistent with medical renal disease. 2. Echogenic debris within the urinary bladder. Please correlate with urinalysis. Electronically Signed   By: Randa Ngo M.D.   On: 10/16/2020 02:49        Scheduled Meds: . amitriptyline  100 mg Oral QHS  . amLODipine  10 mg Oral Daily  . atorvastatin  40 mg Oral QHS  . busPIRone  15 mg Oral BID  . calcitRIOL  0.25 mcg Oral Daily  .  clopidogrel  75 mg Oral Daily  . docusate sodium  200 mg Oral QHS  . ezetimibe  10 mg Oral Daily  . ferrous sulfate  325 mg Oral Q breakfast  . heparin  5,000 Units Subcutaneous Q8H  . hydrALAZINE  100 mg Oral Q8H  . insulin aspart  0-15 Units Subcutaneous TID WC  . insulin aspart  0-5 Units Subcutaneous QHS  . insulin glargine  24 Units Subcutaneous QHS  . isosorbide mononitrate  60 mg Oral Daily  . metoprolol succinate  100 mg Oral Daily  . mometasone-formoterol  2 puff Inhalation BID  . pantoprazole  40 mg Oral Daily  . polyethylene glycol  17 g Oral Daily  . sertraline  100 mg Oral Daily  . sodium bicarbonate  1,300 mg Oral BID  . sodium zirconium cyclosilicate  10 g Oral Daily   Continuous Infusions:   LOS: 1 day    Time spent: 63mn  PDomenic Polite MD [facility]  10/17/2020, 12:00 PM

## 2020-10-17 NOTE — Discharge Summary (Signed)
Physician Discharge Summary  LISABELLA PAO [id number] DOB: 1971/01/23 DOA: 10/15/2020  PCP: No primary care provider on file.  Admit date: 10/15/2020 Discharge date: 10/17/2020  Time spent: 40 minutes  Recommendations for Outpatient Follow-up:  1. Follow-up with nephrology for CKD 4 at [facility], monitor BMP periodically for hyperkalemia   Discharge Diagnoses:  Principal Problem:   Chest pain CAD, PCI and stents Chronic pancreatitis PAD with multiple stents CKD stage IV Hyperkalemia Gastroparesis Insulin-dependent diabetes mellitus   Hyponatremia   CAD (coronary artery disease)   AKI (acute kidney injury) ([place]) Type II diabetes ([place])   Discharge Condition: Stable  Diet recommendation: Renal, carb modified diet  Filed Weights   10/15/20 2210 10/16/20 0240  Weight: 81.6 kg 91.7 kg    History of present illness:  50 year old chronically ill female history of insulin-dependent diabetes, gastroparesis, GERD, chronic pancreatitis, CAD with history of PCI and stents, CKD 3b,PAD with multiple stents, currently incarcerated for the last few months ago at [facility] in [place], with a hip fracture, underwent surgery done and was discharged to [facility] for rehabilitation due to her chronic medical conditions along with officer supervision. -She was sent to the ED with sharp chest pain which radiated to her right jaw and right arm yesterday associated with some cough and sputum with some blood. -In the ED she was hemodynamically stable, labs were notable for creatinine of 4, chest x-ray was unremarkable, EKG without acute ST-T wave changes and high-sensitivity troponin was normal  Hospital Course:   Atypical chest pain -EKG without ischemic changes, high-sensitivity troponin is negative -Extensive history of PCI and multiple stents -Seen by cardiology Dr.Patwardhan in consultation,no plans for additional work-up in the absence of ACS and advanced kidney disease  CAD, history of  multiple PCI and stents -Continue aspirin, Plavix, metoprolol, statin  CKD 4 Hyperkalemia -Baseline creatinine ranges from 3.5-4, so this is not acute, I called [facility] yesterday to obtain baseline labs -Followed by [facility], has completed vein mapping in anticipation of AV fistula -Potassium was elevated, treated with Lokelma, repeat potassium has normalized, counseled regarding renal diet  COPD -Stable, continue inhalers  Insulin-dependent diabetes mellitus -Continue home basal insulin and sliding scale  Chronic pain -Continue home regimen of oxycodone  Anemia of chronic disease -Stable, continue iron -Consider every 2 weeks erythropoietin  Discharge Exam: Vitals:   10/17/20 1000 10/17/20 1058  BP: (!) 179/93 (!) 148/91  Pulse:  87  Resp:  20  Temp:  97.9 F (36.6 C)  SpO2:  97%    General: AAOx3 Cardiovascular: S1S2/RRR Respiratory: CTAB  Discharge Instructions    Allergies as of 10/17/2020      Reactions   Fish Allergy Anaphylaxis   Other Hives, Shortness Of Breath   Bell Peppers  Hives in mouth   Lidocaine Nausea And Vomiting   When given in mouth only. Previously tolerated for PICC line placement.   Acetaminophen    Cefadroxil Hives   tolerated cefepime per [facility]   Compazine [prochlorperazine Edisylate] Hives   Tolerates promethazine   Darvocet [propoxyphene N-acetaminophen] Hives   Zofran [ondansetron Hcl] Hives   Bromfenac    Other reaction(s): Other (See Comments) Unknown reaction/Takes ASA daily at home   Ketorolac Itching   Nsaids Hives   Penicillins Hives      Medication List    TAKE these medications   albuterol 108 (90 Base) MCG/ACT inhaler Commonly known as: VENTOLIN HFA Inhale 1 puff into the lungs every 8 (eight)  hours as needed for wheezing or shortness of breath.   amitriptyline 100 MG tablet Commonly known as: ELAVIL Take 100 mg by mouth at bedtime.   amLODipine 10 MG tablet Commonly  known as: NORVASC Take 10 mg by mouth daily.   aspirin EC 81 MG tablet Take 81 mg by mouth daily. Swallow whole.   atorvastatin 40 MG tablet Commonly known as: LIPITOR Take 40 mg by mouth at bedtime.   busPIRone 15 MG tablet Commonly known as: BUSPAR Take 15 mg by mouth 2 (two) times daily.   calcitRIOL 0.25 MCG capsule Commonly known as: ROCALTROL Take 0.25 mcg by mouth daily.   clopidogrel 75 MG tablet Commonly known as: PLAVIX Take 75 mg by mouth daily.   Darbepoetin Alfa 40 MCG/ML Soln Inject 40 mcg as directed every Thursday.   diphenhydrAMINE 25 MG tablet Commonly known as: BENADRYL Take 25 mg by mouth every 6 (six) hours as needed for itching or sleep.   docusate sodium 100 MG capsule Commonly known as: COLACE Take 200 mg by mouth at bedtime.   eucerin cream Apply 1 application topically daily.   ezetimibe 10 MG tablet Commonly known as: ZETIA Take 10 mg by mouth daily.   ferrous sulfate 325 (65 FE) MG tablet Take 325 mg by mouth daily with breakfast.   hydrALAZINE 50 MG tablet Commonly known as: APRESOLINE Take 100 mg by mouth every 8 (eight) hours.   insulin glargine 100 UNIT/ML injection Commonly known as: LANTUS Inject 0.1 mLs (10 Units total) into the skin at bedtime. What changed: how much to take   insulin lispro 100 UNIT/ML injection Commonly known as: HUMALOG Inject 3-5 Units into the skin See admin instructions. Per sliding scale before meals and at bedtime <150-0 units, 151-200= 1 unit, 201-250=2 units,251-300=3 units, 301-350= 4 units, 351-400=5 units. >400  or <70 call md   isosorbide mononitrate 60 MG 24 hr tablet Commonly known as: IMDUR Take 60 mg by mouth daily.   melatonin 3 MG Tabs tablet Take 3 mg by mouth at bedtime as needed (sleep).   metoprolol succinate 50 MG 24 hr tablet Commonly known as: TOPROL-XL Take 50 mg by mouth daily. Take with or immediately following a meal.   mometasone-formoterol 100-5 MCG/ACT  Aero Commonly known as: DULERA Inhale 2 puffs into the lungs 2 (two) times daily.   nitroGLYCERIN 0.4 MG SL tablet Commonly known as: NITROSTAT Place 0.4 mg under the tongue every 5 (five) minutes as needed for chest pain.   oxyCODONE 5 MG immediate release tablet Commonly known as: Oxy IR/ROXICODONE Take 5 mg by mouth every 4 (four) hours as needed for severe pain.   pantoprazole 40 MG tablet Commonly known as: PROTONIX Take 40 mg by mouth daily.   polyethylene glycol powder 17 GM/SCOOP powder Commonly known as: GLYCOLAX/MIRALAX Take 17 g by mouth daily.   sertraline 100 MG tablet Commonly known as: ZOLOFT Take 100 mg by mouth daily.   sodium bicarbonate 650 MG tablet Take 1,300 mg by mouth 2 (two) times daily.   SODIUM CHLORIDE IN Place 2 sprays into both nostrils every 4 (four) hours as needed (congestion).      Allergies  Allergen Reactions  . Fish Allergy Anaphylaxis  . Other Hives and Shortness Of Breath    Bell Peppers  Hives in mouth  . Lidocaine Nausea And Vomiting    When given in mouth only. Previously tolerated for PICC line placement.  . Acetaminophen   . Cefadroxil Hives  tolerated cefepime per [facility]  . Compazine [Prochlorperazine Edisylate] Hives    Tolerates promethazine  . Darvocet [Propoxyphene N-Acetaminophen] Hives  . Zofran [Ondansetron Hcl] Hives  . Bromfenac     Other reaction(s): Other (See Comments) Unknown reaction/Takes ASA daily at home  . Ketorolac Itching  . Nsaids Hives  . Penicillins Hives      The results of significant diagnostics from this hospitalization (including imaging, microbiology, ancillary and laboratory) are listed below for reference.    Significant Diagnostic Studies: DG Chest 2 View  Result Date: 10/15/2020 CLINICAL DATA:  Chest pain. EXAM: CHEST - 2 VIEW COMPARISON:  November 11, 2013. FINDINGS: The heart size and mediastinal contours are within normal limits. No pneumothorax or pleural effusion is  noted. Hypoinflation of the lungs is noted with mild bibasilar subsegmental atelectasis. The visualized skeletal structures are unremarkable. IMPRESSION: Hypoinflation of the lungs with mild bibasilar subsegmental atelectasis. Electronically Signed   By: Marijo Conception M.D.   On: 10/15/2020 18:24   US RENAL  Result Date: 10/16/2020 CLINICAL DATA:  Acute renal insufficiency EXAM: RENAL / URINARY TRACT ULTRASOUND COMPLETE COMPARISON:  None. FINDINGS: Right Kidney: Renal measurements: 8.8 x 5.2 x 4.5 cm = volume: 106.8 mL. Mild increased renal cortical echotexture. Corticomedullary differentiation well preserved. No mass or hydronephrosis. Left Kidney: Renal measurements: 11.4 x 5.5 x 4.5 cm = volume: 148.4 mL. Mild increased renal cortical echotexture. Corticomedullary differentiation is preserved. No mass or hydronephrosis. Bladder: Bladder is moderately distended, with echogenic debris identified. No filling defects. Other: None. IMPRESSION: 1. Mild increased renal cortical echotexture in the bilateral kidneys, consistent with medical renal disease. 2. Echogenic debris within the urinary bladder. Please correlate with urinalysis. Electronically Signed   By: Randa Ngo M.D.   On: 10/16/2020 02:49    Microbiology: Recent Results (from the past 240 hour(s))  Resp Panel by RT-PCR (Flu A&B, Covid) Nasopharyngeal Swab     Status: None   Collection Time: 10/15/20 10:33 PM   Specimen: Nasopharyngeal Swab; Nasopharyngeal(NP) swabs in vial transport medium  Result Value Ref Range Status   SARS Coronavirus 2 by RT PCR NEGATIVE NEGATIVE Final    Comment: (NOTE) SARS-CoV-2 target nucleic acids are NOT DETECTED.  The SARS-CoV-2 RNA is generally detectable in upper respiratory specimens during the acute phase of infection. The lowest concentration of SARS-CoV-2 viral copies this assay can detect is 138 copies/mL. A negative result does not preclude SARS-Cov-2 infection and should not be used as the sole  basis for treatment or other patient management decisions. A negative result may occur with  improper specimen collection/handling, submission of specimen other than nasopharyngeal swab, presence of viral mutation(s) within the areas targeted by this assay, and inadequate number of viral copies(<138 copies/mL). A negative result must be combined with clinical observations, patient history, and epidemiological information. The expected result is Negative.  Fact Sheet for Patients:  EntrepreneurPulse.com.au  Fact Sheet for Healthcare Providers:  IncredibleEmployment.be  This test is no t yet approved or cleared by the [place] FDA and  has been authorized for detection and/or diagnosis of SARS-CoV-2 by FDA under an Emergency Use Authorization (EUA). This EUA will remain  in effect (meaning this test can be used) for the duration of the COVID-19 declaration under Section 564(b)(1) of the Act, 21 U.S.C.section 360bbb-3(b)(1), unless the authorization is terminated  or revoked sooner.       Influenza A by PCR NEGATIVE NEGATIVE Final   Influenza B by PCR NEGATIVE NEGATIVE Final  Comment: (NOTE) The Xpert Xpress SARS-CoV-2/FLU/RSV plus assay is intended as an aid in the diagnosis of influenza from Nasopharyngeal swab specimens and should not be used as a sole basis for treatment. Nasal washings and aspirates are unacceptable for Xpert Xpress SARS-CoV-2/FLU/RSV testing.  Fact Sheet for Patients: EntrepreneurPulse.com.au  Fact Sheet for Healthcare Providers: IncredibleEmployment.be  This test is not yet approved or cleared by the [place] FDA and has been authorized for detection and/or diagnosis of SARS-CoV-2 by FDA under an Emergency Use Authorization (EUA). This EUA will remain in effect (meaning this test can be used) for the duration of the COVID-19 declaration under Section 564(b)(1) of the Act,  21 U.S.C. section 360bbb-3(b)(1), unless the authorization is terminated or revoked.  Performed at [facility], [place] [address], [place], [place] [address]   MRSA PCR Screening     Status: None   Collection Time: 10/16/20  2:43 AM   Specimen: Nasopharyngeal  Result Value Ref Range Status   MRSA by PCR NEGATIVE NEGATIVE Final    Comment:        The GeneXpert MRSA Assay (FDA approved for NASAL specimens only), is one component of a comprehensive MRSA colonization surveillance program. It is not intended to diagnose MRSA infection nor to guide or monitor treatment for MRSA infections. Performed at [facility], [place] [address] [place], [place] [address]      Labs: Basic Metabolic Panel: Recent Labs  Lab 10/15/20 1754 10/15/20 1818 10/17/20 0623 10/17/20 0910 10/17/20 1345  NA 129*  --  135 134* 134*  K 5.2*  --  5.9* 5.2* 4.8  CL 101  --  109 107 107  CO2 19*  --  15* 16* 19*  GLUCOSE 166*  --  142* 170* 211*  BUN 53*  --  49* 48* 47*  CREATININE 4.03*  --  3.88* 3.79* 3.81*  CALCIUM 8.9  --  RESULTS UNAVAILABLE DUE TO INTERFERING SUBSTANCE 8.8* 8.5*  MG  --  1.7  --   --   --    Liver Function Tests: No results for input(s): AST, ALT, ALKPHOS, BILITOT, PROT, ALBUMIN in the last 168 hours. No results for input(s): LIPASE, AMYLASE in the last 168 hours. No results for input(s): AMMONIA in the last 168 hours. CBC: Recent Labs  Lab 10/15/20 1754 10/17/20 0623  WBC 8.3 5.8  HGB 9.9* 8.9*  HCT 32.3* 26.9*  MCV 88.5 84.9  PLT 265 72*   Cardiac Enzymes: No results for input(s): CKTOTAL, CKMB, CKMBINDEX, TROPONINI in the last 168 hours. BNP: BNP (last 3 results) No results for input(s): BNP in the last 8760 hours.  ProBNP (last 3 results) No results for input(s): PROBNP in the last 8760 hours.  CBG: Recent Labs  Lab 10/16/20 1123 10/16/20 1538 10/16/20 2111 10/17/20 0610 10/17/20 1104  GLUCAP 147* 136* 132* 145* 116*        Signed:  Domenic Polite MD.  [facility] 10/17/2020, 2:40 PM

## 2020-10-17 NOTE — Progress Notes (Signed)
Report given to [facility] nurse. Awaiting [facility] for transport.

## 2020-10-17 NOTE — TOC Transition Note (Addendum)
Transition of Care [facility]) - CM/SW Discharge Note   Patient Details  Name: [name] MRN: [id number] Date of Birth: 06/03/1971  [facility] CM/SW Contact:  Ella Bodo, RN Phone Number: 10/17/2020, 4:20 PM   Clinical Narrative:  Pt admitted from [facility] on 10/15/20.  She is medically stable for discharge today, and facility is able to accept her today, per [facility] in admissions.  Bedside nurse will need to call report to [phone] or [phone].  Dr. Reesa Chew will be accepting MD at [facility]; patient to dc to room 210.    Pt in custody of [facility]; two guards at bedside.  Per MD, patient safe for BLS transport.  Called for PTAR for transport at 4:29pm.      Final next level of care: Long Term Acute Care (LTAC) Barriers to Discharge: Barriers Resolved                       s     Readmission Risk Interventions No flowsheet data found.   Reinaldo Raddle, RN, BSN  Trauma/Neuro ICU Case Manager [phone]

## 2020-10-17 NOTE — Progress Notes (Signed)
2 guards at bedside, left arm shackled.
